# Patient Record
Sex: Female | Born: 1940 | Race: White | Hispanic: No | Smoking: Former smoker
Health system: Southern US, Community
[De-identification: ages and names within clinical notes are randomized; demographics above are authoritative.]

## PROBLEM LIST (undated history)

## (undated) DIAGNOSIS — R0902 Hypoxemia: Secondary | ICD-10-CM

## (undated) DIAGNOSIS — K219 Gastro-esophageal reflux disease without esophagitis: Secondary | ICD-10-CM

## (undated) DIAGNOSIS — J45909 Unspecified asthma, uncomplicated: Secondary | ICD-10-CM

## (undated) DIAGNOSIS — I4891 Unspecified atrial fibrillation: Secondary | ICD-10-CM

## (undated) DIAGNOSIS — T7840XA Allergy, unspecified, initial encounter: Secondary | ICD-10-CM

## (undated) DIAGNOSIS — J9691 Respiratory failure, unspecified with hypoxia: Secondary | ICD-10-CM

## (undated) DIAGNOSIS — H269 Unspecified cataract: Secondary | ICD-10-CM

## (undated) DIAGNOSIS — F419 Anxiety disorder, unspecified: Secondary | ICD-10-CM

## (undated) DIAGNOSIS — E7800 Pure hypercholesterolemia, unspecified: Secondary | ICD-10-CM

## (undated) DIAGNOSIS — M810 Age-related osteoporosis without current pathological fracture: Secondary | ICD-10-CM

## (undated) DIAGNOSIS — I10 Essential (primary) hypertension: Secondary | ICD-10-CM

## (undated) DIAGNOSIS — J449 Chronic obstructive pulmonary disease, unspecified: Secondary | ICD-10-CM

## (undated) DIAGNOSIS — F32A Depression, unspecified: Secondary | ICD-10-CM

## (undated) HISTORY — DX: Depression, unspecified: F32.A

## (undated) HISTORY — DX: Allergy, unspecified, initial encounter: T78.40XA

## (undated) HISTORY — DX: Unspecified atrial fibrillation: I48.91

## (undated) HISTORY — DX: Gastro-esophageal reflux disease without esophagitis: K21.9

## (undated) HISTORY — DX: Age-related osteoporosis without current pathological fracture: M81.0

## (undated) HISTORY — DX: Hypoxemia: R09.02

## (undated) HISTORY — DX: Chronic obstructive pulmonary disease, unspecified: J44.9

## (undated) HISTORY — DX: Respiratory failure, unspecified with hypoxia: J96.91

## (undated) HISTORY — DX: Unspecified cataract: H26.9

## (undated) HISTORY — DX: Anxiety disorder, unspecified: F41.9

## (undated) HISTORY — DX: Unspecified asthma, uncomplicated: J45.909

## (undated) HISTORY — DX: Pure hypercholesterolemia, unspecified: E78.00

---

## 2004-06-13 DIAGNOSIS — I050 Rheumatic mitral stenosis: Secondary | ICD-10-CM | POA: Insufficient documentation

## 2005-08-31 DIAGNOSIS — R0609 Other forms of dyspnea: Secondary | ICD-10-CM | POA: Diagnosis present

## 2012-11-11 DIAGNOSIS — J449 Chronic obstructive pulmonary disease, unspecified: Secondary | ICD-10-CM | POA: Insufficient documentation

## 2014-03-17 DIAGNOSIS — I441 Atrioventricular block, second degree: Secondary | ICD-10-CM | POA: Insufficient documentation

## 2019-12-02 DIAGNOSIS — E559 Vitamin D deficiency, unspecified: Secondary | ICD-10-CM | POA: Insufficient documentation

## 2019-12-02 DIAGNOSIS — Z9981 Dependence on supplemental oxygen: Secondary | ICD-10-CM | POA: Insufficient documentation

## 2019-12-02 DIAGNOSIS — J4540 Moderate persistent asthma, uncomplicated: Secondary | ICD-10-CM

## 2019-12-02 DIAGNOSIS — K582 Mixed irritable bowel syndrome: Secondary | ICD-10-CM | POA: Insufficient documentation

## 2019-12-02 DIAGNOSIS — R87612 Low grade squamous intraepithelial lesion on cytologic smear of cervix (LGSIL): Secondary | ICD-10-CM

## 2019-12-02 DIAGNOSIS — F3341 Major depressive disorder, recurrent, in partial remission: Secondary | ICD-10-CM | POA: Insufficient documentation

## 2019-12-02 DIAGNOSIS — Z8781 Personal history of (healed) traumatic fracture: Secondary | ICD-10-CM | POA: Insufficient documentation

## 2019-12-02 HISTORY — DX: Moderate persistent asthma, uncomplicated: J45.40

## 2019-12-02 HISTORY — DX: Low grade squamous intraepithelial lesion on cytologic smear of cervix (LGSIL): R87.612

## 2020-01-15 DIAGNOSIS — M810 Age-related osteoporosis without current pathological fracture: Secondary | ICD-10-CM | POA: Insufficient documentation

## 2020-01-22 ENCOUNTER — Institutional Professional Consult (permissible substitution): Payer: Medicare HMO | Admitting: Pulmonary Disease

## 2020-01-22 ENCOUNTER — Other Ambulatory Visit: Payer: Self-pay

## 2020-01-22 NOTE — Progress Notes (Deleted)
   Synopsis: Referred in December 2021 for dyspnea on exertion by [name], MD  Subjective:   PATIENT ID: Lindsay [name]: female DOB: October 02, 1940, MRN: [id number]  No chief complaint on file.   HPI  ***  No past medical history on file.   No family history on file.   *** The histories are not reviewed yet. Please review them in the "History" navigator section and refresh this SmartLink.  Social History   Socioeconomic History  . Marital status: Not on file    Spouse name: Not on file  . Number of children: Not on file  . Years of education: Not on file  . Highest education level: Not on file  Occupational History  . Not on file  Tobacco Use  . Smoking status: Not on file  . Smokeless tobacco: Not on file  Substance and Sexual Activity  . Alcohol use: Not on file  . Drug use: Not on file  . Sexual activity: Not on file  Other Topics Concern  . Not on file  Social History Narrative  . Not on file   Social Determinants of Health   Financial Resource Strain: Not on file  Food Insecurity: Not on file  Transportation Needs: Not on file  Physical Activity: Not on file  Stress: Not on file  Social Connections: Not on file  Intimate Partner Violence: Not on file     Not on File   No outpatient medications prior to visit.   No facility-administered medications prior to visit.    ROS   Objective:  Physical Exam   There were no vitals filed for this visit.   on *** LPM *** RA BMI Readings from Last 3 Encounters:  No data found for BMI   Wt Readings from Last 3 Encounters:  No data found for Wt     CBC No results found for: WBC, RBC, HGB, HCT, PLT, MCV, MCH, MCHC, RDW, LYMPHSABS, MONOABS, EOSABS, BASOSABS  ***  Chest Imaging: ***  Pulmonary Functions Testing Results: No flowsheet data found.  FeNO: ***  Pathology: ***  Echocardiogram: ***  Heart Catheterization: ***    Assessment & Plan:     ICD-10-CM   1. DOE (dyspnea on  exertion)  R06.00   2. SOB (shortness of breath)  R06.02     Discussion: ***  No current outpatient medications on file.  I spent *** minutes dedicated to the care of this patient on the date of this encounter to include pre-visit review of records, face-to-face time with the patient discussing conditions above, post visit ordering of testing, clinical documentation with the electronic health record, making appropriate referrals as documented, and communicating necessary findings to members of the patients care team.   Josephine Igo, DO [facility] 01/22/2020 8:14 AM

## 2020-02-17 ENCOUNTER — Ambulatory Visit: Payer: Medicare HMO | Admitting: Pulmonary Disease

## 2020-02-17 ENCOUNTER — Telehealth: Payer: Self-pay | Admitting: Pulmonary Disease

## 2020-02-17 ENCOUNTER — Other Ambulatory Visit: Payer: Self-pay

## 2020-02-17 ENCOUNTER — Encounter: Payer: Self-pay | Admitting: Pulmonary Disease

## 2020-02-17 VITALS — BP 114/74 | HR 100 | Temp 97.6°F | Ht 67.0 in | Wt 230.6 lb

## 2020-02-17 DIAGNOSIS — J9611 Chronic respiratory failure with hypoxia: Secondary | ICD-10-CM | POA: Diagnosis not present

## 2020-02-17 DIAGNOSIS — J4540 Moderate persistent asthma, uncomplicated: Secondary | ICD-10-CM

## 2020-02-17 MED ORDER — ALBUTEROL SULFATE HFA 108 (90 BASE) MCG/ACT IN AERS: 1.0000 | INHALATION_SPRAY | Freq: Four times a day (QID) | RESPIRATORY_TRACT | 6 refills | Status: DC | PRN

## 2020-02-17 NOTE — Patient Instructions (Addendum)
Continue trelegy ellipta inhaler 1 puff daily  Use albuterol inhaler or duoneb nebulizer treatment as needed every 4-6 hours  We will order you a portable oxygen concentrator to be use with physical therapy  Have physical therapy work on posture exercises to help improve your breathing overall

## 2020-02-17 NOTE — Progress Notes (Signed)
Synopsis: Referred in January 2022 for chronic respiratory failure  Subjective:   PATIENT ID: Lindsay [name] GENDER: female DOB: March 12, 1940, MRN: [id number]   HPI  Chief Complaint  Patient presents with  . Consult    Referred by PCP Dr. Bernerd Limbo for chronic SOB. States she is on 2L of O2 24/7. Has noticed the increased SOB for the past 2 years, recently moved to [place].    Lindsay [name] is a 81 year old woman, never smoker with chronic respiratory failure on 2L of supplemental oxygen who is referred to pulmonary clinic for progressive dyspnea.   She reports she started having issues with her breathing 2 years ago after she suffered compound fractures in her back that led to an increase in the curvature of her thoracic spine. She was started on supplemental oxygen shortly after these events occurred. Per her chart she has exertional oxygen desaturations to 87% from her physical therapy notes. She also reports cough and wheezing intermittently. She denies sputum production. She is a never smoker but lived with a smoker for 20 years. She denies history of pneumonias.   She has been started on trelegy ellipta and she felt this helped her for a period of time but doesn't notice lasting relief from it anymore.   She has atrial fibrillation. She does have leg swelling and is taking torsemide.   She has received 2 covid vaccines. She received her pneumonia vaccine 2 weeks ago and she had the influenza vaccine this past fall.     She lived in [place] before but moved to [place] as she had frequent falls and lived on her own. Her daughter lives here in town.   Past Medical History:  Diagnosis Date  . Asthma   . Atrial fibrillation ([place])   . High cholesterol   . Respiratory failure with hypoxia ([place])      History reviewed. No pertinent family history.   Social History   Socioeconomic History  . Marital status: Divorced    Spouse name: Not on file  . Number of children: Not on file   . Years of education: Not on file  . Highest education level: Not on file  Occupational History  . Not on file  Tobacco Use  . Smoking status: Never Smoker  . Smokeless tobacco: Never Used  Substance and Sexual Activity  . Alcohol use: Not on file  . Drug use: Not on file  . Sexual activity: Not on file  Other Topics Concern  . Not on file  Social History Narrative  . Not on file   Social Determinants of Health   Financial Resource Strain: Not on file  Food Insecurity: Not on file  Transportation Needs: Not on file  Physical Activity: Not on file  Stress: Not on file  Social Connections: Not on file  Intimate Partner Violence: Not on file     Allergies  Allergen Reactions  . Acetaminophen-Codeine     Other reaction(s): Agitation     Outpatient Medications Prior to Visit  Medication Sig Dispense Refill  . alendronate (FOSAMAX) 70 MG tablet Take 70 mg by mouth once a week.    Marland Kitchen apixaban (ELIQUIS) 5 MG TABS tablet Take 5 mg by mouth 2 (two) times daily.    Marland Kitchen atorvastatin (LIPITOR) 20 MG tablet Take 20 mg by mouth daily.    . Cholecalciferol 25 MCG (1000 UT) tablet Take by mouth.    . Fluticasone-Umeclidin-Vilant (TRELEGY ELLIPTA) 100-62.5-25 MCG/INH AEPB Inhale 1 puff  into the lungs daily.    Marland Kitchen ipratropium-albuterol (DUONEB) 0.5-2.5 (3) MG/3ML SOLN Take 1 ampule by nebulization 4 (four) times daily.    Marland Kitchen LORazepam (ATIVAN) 1 MG tablet Take 1 mg by mouth 2 (two) times daily.    . metolazone (ZAROXOLYN) 5 MG tablet Take 1 tablet on Monday, Wednesday and Friday.    . metoprolol succinate (TOPROL-XL) 50 MG 24 hr tablet Take by mouth.    . mirtazapine (REMERON) 45 MG tablet Take by mouth.    . sertraline (ZOLOFT) 100 MG tablet Take by mouth.    . torsemide (DEMADEX) 20 MG tablet Take 20 mg by mouth 2 (two) times daily.    Marland Kitchen albuterol (VENTOLIN HFA) 108 (90 Base) MCG/ACT inhaler Inhale into the lungs.     No facility-administered medications prior to visit.    Review of  Systems  Constitutional: Negative for chills, fever, malaise/fatigue and weight loss.  HENT: Negative for congestion, sinus pain and sore throat.   Eyes: Negative.   Respiratory: Positive for shortness of breath and wheezing. Negative for cough, hemoptysis and sputum production.   Cardiovascular: Negative for chest pain, palpitations, orthopnea, claudication and leg swelling.  Gastrointestinal: Negative for abdominal pain, heartburn, nausea and vomiting.  Genitourinary: Negative.   Musculoskeletal: Positive for back pain and falls. Negative for joint pain and myalgias.  Skin: Negative for rash.  Neurological: Negative for weakness.  Endo/Heme/Allergies: Negative.   Psychiatric/Behavioral: Negative.    Objective:   Vitals:   02/17/20 0952  BP: 114/74  Pulse: 100  Temp: 97.6 F (36.4 C)  TempSrc: Temporal  SpO2: 90%  Weight: 230 lb 9.6 oz (104.6 kg)  Height: 5\' 7"  (1.702 m)     Physical Exam Constitutional:      General: She is not in acute distress.    Appearance: She is ill-appearing (chronically).  HENT:     Head: Normocephalic and atraumatic.  Eyes:     General: No scleral icterus.    Conjunctiva/sclera: Conjunctivae normal.     Pupils: Pupils are equal, round, and reactive to light.  Cardiovascular:     Rate and Rhythm: Normal rate and regular rhythm.     Pulses: Normal pulses.     Heart sounds: Normal heart sounds. No murmur heard.   Pulmonary:     Effort: Pulmonary effort is normal.     Breath sounds: Normal breath sounds. No wheezing, rhonchi or rales.  Abdominal:     General: Bowel sounds are normal.     Palpations: Abdomen is soft.  Musculoskeletal:     Right lower leg: No edema.     Left lower leg: No edema.     Comments: Scoliosis noted  Lymphadenopathy:     Cervical: No cervical adenopathy.  Skin:    General: Skin is warm and dry.  Neurological:     General: No focal deficit present.     Mental Status: She is alert.  Psychiatric:        Mood  and Affect: Mood normal.        Behavior: Behavior normal.        Thought Content: Thought content normal.        Judgment: Judgment normal.    CBC No results found for: WBC, RBC, HGB, HCT, PLT, MCV, MCH, MCHC, RDW, LYMPHSABS, MONOABS, EOSABS, BASOSABS   Chest imaging: CXR 12/16/19 The lungs are under aerated with bronchovascular crowding and interstitial prominence. Increased AP diameter on the lateral view. No focal airspace opacities. Minimal blunting  of both costophrenic angles. Mild enlargement of the cardiac silhouette. Prominent central vessels. The thoracic aorta is markedly tortuous. Moderate scattered bony degenerative changes. Increased kyphosis with decreased height of numerous thoracic vertebral bodies. Levoscoliosis of the upper thoracic spine. Diffuse decreased bone density.  PFT: No flowsheet data found.  Labs: Reviewed from [facility] at [place] 1.11 on 12/02/19 Normal CBC  Assessment & Plan:   Chronic respiratory failure with hypoxia ([place]) - Plan: Ambulatory Referral for DME  Moderate persistent asthma without complication  Discussion: Lindsay [name] is a 80 year old woman, never smoker with chronic respiratory failure on 2L of supplemental oxygen who is referred to pulmonary clinic for progressive dyspnea.  She has moderate persistent asthma based on her clinical symptoms. She is to continue on trelegy ellipta daily with as needed albuterol.   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 The timeline of events is consistent with her changes in breathing as well after her compound vertebral body fractures.   Recommend that physical therapy work on posture exercises in order to help her breath better and open up her thoracic cage as much as possible.   Follow up in 3 months.   Freda Jackson, MD [facility] Office: [phone]   See Amion for Pager Details      Current Outpatient Medications:  .  alendronate (FOSAMAX) 70 MG tablet, Take 70 mg by mouth once a week., Disp: , Rfl:  .  apixaban (ELIQUIS) 5 MG TABS tablet, Take 5 mg by mouth 2 (two) times daily., Disp: , Rfl:  .  atorvastatin (LIPITOR) 20 MG tablet, Take 20 mg by mouth daily., Disp: , Rfl:  .  Cholecalciferol 25 MCG (1000 UT) tablet, Take by mouth., Disp: , Rfl:  .  Fluticasone-Umeclidin-Vilant (TRELEGY ELLIPTA) 100-62.5-25 MCG/INH AEPB, Inhale 1 puff into the lungs daily., Disp: , Rfl:  .  ipratropium-albuterol (DUONEB) 0.5-2.5 (3) MG/3ML SOLN, Take 1 ampule by nebulization 4 (four) times daily., Disp: , Rfl:  .  LORazepam (ATIVAN) 1 MG tablet, Take 1 mg by mouth 2 (two) times daily., Disp: , Rfl:  .  metolazone (ZAROXOLYN) 5 MG tablet, Take 1 tablet on Monday, Wednesday and Friday., Disp: , Rfl:  .  metoprolol succinate (TOPROL-XL) 50 MG 24 hr tablet, Take by mouth., Disp: , Rfl:  .  mirtazapine (REMERON) 45 MG tablet, Take by mouth., Disp: , Rfl:  .  sertraline (ZOLOFT) 100 MG tablet, Take by mouth., Disp: , Rfl:  .  torsemide (DEMADEX) 20 MG tablet, Take 20 mg by mouth 2 (two) times daily., Disp: , Rfl:  .  albuterol (VENTOLIN HFA) 108 (90 Base) MCG/ACT inhaler, Inhale 1-2 puffs into the lungs every 6 (six) hours as needed for wheezing or shortness of breath., Disp: 8 g, Rfl: 6

## 2020-02-17 NOTE — Telephone Encounter (Signed)
Patient was seen today by Dr. Erin Fulling for a consult. Patient was a previous patient of [facility] (Dr. Jerl Santos) in [place]. Patient has signed a release of information for a copy of her previous OV notes, echo and PFT results. Will fax release today.

## 2020-03-04 ENCOUNTER — Encounter: Payer: Self-pay | Admitting: Pulmonary Disease

## 2020-08-02 ENCOUNTER — Inpatient Hospital Stay (HOSPITAL_BASED_OUTPATIENT_CLINIC_OR_DEPARTMENT_OTHER)
Admission: EM | Admit: 2020-08-02 | Discharge: 2020-08-06 | DRG: 291 | Disposition: A | Discharge status: Home Health | Payer: Medicare (Managed Care) | Attending: Internal Medicine | Admitting: Internal Medicine

## 2020-08-02 ENCOUNTER — Other Ambulatory Visit: Payer: Self-pay

## 2020-08-02 ENCOUNTER — Inpatient Hospital Stay (HOSPITAL_COMMUNITY): Payer: Medicare (Managed Care)

## 2020-08-02 ENCOUNTER — Encounter (HOSPITAL_BASED_OUTPATIENT_CLINIC_OR_DEPARTMENT_OTHER): Payer: Self-pay | Admitting: Emergency Medicine

## 2020-08-02 ENCOUNTER — Emergency Department (HOSPITAL_BASED_OUTPATIENT_CLINIC_OR_DEPARTMENT_OTHER): Payer: Medicare (Managed Care)

## 2020-08-02 DIAGNOSIS — Z886 Allergy status to analgesic agent status: Secondary | ICD-10-CM | POA: Diagnosis not present

## 2020-08-02 DIAGNOSIS — R0602 Shortness of breath: Secondary | ICD-10-CM | POA: Diagnosis not present

## 2020-08-02 DIAGNOSIS — M4000 Postural kyphosis, site unspecified: Secondary | ICD-10-CM | POA: Diagnosis present

## 2020-08-02 DIAGNOSIS — K220 Achalasia of cardia: Secondary | ICD-10-CM | POA: Diagnosis present

## 2020-08-02 DIAGNOSIS — J45901 Unspecified asthma with (acute) exacerbation: Secondary | ICD-10-CM

## 2020-08-02 DIAGNOSIS — E669 Obesity, unspecified: Secondary | ICD-10-CM | POA: Diagnosis present

## 2020-08-02 DIAGNOSIS — Z885 Allergy status to narcotic agent status: Secondary | ICD-10-CM | POA: Diagnosis not present

## 2020-08-02 DIAGNOSIS — Z9981 Dependence on supplemental oxygen: Secondary | ICD-10-CM

## 2020-08-02 DIAGNOSIS — I2510 Atherosclerotic heart disease of native coronary artery without angina pectoris: Secondary | ICD-10-CM | POA: Diagnosis present

## 2020-08-02 DIAGNOSIS — R54 Age-related physical debility: Secondary | ICD-10-CM | POA: Diagnosis present

## 2020-08-02 DIAGNOSIS — I5043 Acute on chronic combined systolic (congestive) and diastolic (congestive) heart failure: Secondary | ICD-10-CM | POA: Diagnosis present

## 2020-08-02 DIAGNOSIS — Z7901 Long term (current) use of anticoagulants: Secondary | ICD-10-CM | POA: Diagnosis not present

## 2020-08-02 DIAGNOSIS — E871 Hypo-osmolality and hyponatremia: Secondary | ICD-10-CM

## 2020-08-02 DIAGNOSIS — I480 Paroxysmal atrial fibrillation: Secondary | ICD-10-CM

## 2020-08-02 DIAGNOSIS — Z79899 Other long term (current) drug therapy: Secondary | ICD-10-CM

## 2020-08-02 DIAGNOSIS — R3911 Hesitancy of micturition: Secondary | ICD-10-CM | POA: Diagnosis present

## 2020-08-02 DIAGNOSIS — I509 Heart failure, unspecified: Secondary | ICD-10-CM | POA: Diagnosis not present

## 2020-08-02 DIAGNOSIS — M810 Age-related osteoporosis without current pathological fracture: Secondary | ICD-10-CM | POA: Diagnosis present

## 2020-08-02 DIAGNOSIS — E876 Hypokalemia: Secondary | ICD-10-CM | POA: Diagnosis not present

## 2020-08-02 DIAGNOSIS — R1312 Dysphagia, oropharyngeal phase: Secondary | ICD-10-CM | POA: Diagnosis present

## 2020-08-02 DIAGNOSIS — Z8249 Family history of ischemic heart disease and other diseases of the circulatory system: Secondary | ICD-10-CM | POA: Diagnosis not present

## 2020-08-02 DIAGNOSIS — Z803 Family history of malignant neoplasm of breast: Secondary | ICD-10-CM

## 2020-08-02 DIAGNOSIS — R1310 Dysphagia, unspecified: Secondary | ICD-10-CM

## 2020-08-02 DIAGNOSIS — I5031 Acute diastolic (congestive) heart failure: Secondary | ICD-10-CM | POA: Diagnosis present

## 2020-08-02 DIAGNOSIS — I5033 Acute on chronic diastolic (congestive) heart failure: Secondary | ICD-10-CM | POA: Diagnosis present

## 2020-08-02 DIAGNOSIS — Z6837 Body mass index (BMI) 37.0-37.9, adult: Secondary | ICD-10-CM | POA: Diagnosis not present

## 2020-08-02 DIAGNOSIS — R350 Frequency of micturition: Secondary | ICD-10-CM | POA: Diagnosis present

## 2020-08-02 DIAGNOSIS — I4819 Other persistent atrial fibrillation: Secondary | ICD-10-CM

## 2020-08-02 DIAGNOSIS — J9621 Acute and chronic respiratory failure with hypoxia: Secondary | ICD-10-CM

## 2020-08-02 DIAGNOSIS — I5023 Acute on chronic systolic (congestive) heart failure: Secondary | ICD-10-CM

## 2020-08-02 DIAGNOSIS — T502X5A Adverse effect of carbonic-anhydrase inhibitors, benzothiadiazides and other diuretics, initial encounter: Secondary | ICD-10-CM | POA: Diagnosis present

## 2020-08-02 DIAGNOSIS — F419 Anxiety disorder, unspecified: Secondary | ICD-10-CM | POA: Diagnosis present

## 2020-08-02 DIAGNOSIS — Z7983 Long term (current) use of bisphosphonates: Secondary | ICD-10-CM | POA: Diagnosis not present

## 2020-08-02 DIAGNOSIS — Z7722 Contact with and (suspected) exposure to environmental tobacco smoke (acute) (chronic): Secondary | ICD-10-CM | POA: Diagnosis present

## 2020-08-02 DIAGNOSIS — Z20822 Contact with and (suspected) exposure to covid-19: Secondary | ICD-10-CM | POA: Diagnosis present

## 2020-08-02 DIAGNOSIS — I110 Hypertensive heart disease with heart failure: Secondary | ICD-10-CM | POA: Diagnosis not present

## 2020-08-02 DIAGNOSIS — D6859 Other primary thrombophilia: Secondary | ICD-10-CM | POA: Diagnosis present

## 2020-08-02 DIAGNOSIS — J4520 Mild intermittent asthma, uncomplicated: Secondary | ICD-10-CM

## 2020-08-02 DIAGNOSIS — K2970 Gastritis, unspecified, without bleeding: Secondary | ICD-10-CM | POA: Diagnosis present

## 2020-08-02 DIAGNOSIS — E7800 Pure hypercholesterolemia, unspecified: Secondary | ICD-10-CM | POA: Diagnosis present

## 2020-08-02 DIAGNOSIS — D6869 Other thrombophilia: Secondary | ICD-10-CM | POA: Diagnosis not present

## 2020-08-02 DIAGNOSIS — J45909 Unspecified asthma, uncomplicated: Secondary | ICD-10-CM | POA: Diagnosis present

## 2020-08-02 DIAGNOSIS — I700 Atherosclerosis of aorta: Secondary | ICD-10-CM | POA: Diagnosis not present

## 2020-08-02 DIAGNOSIS — J9611 Chronic respiratory failure with hypoxia: Secondary | ICD-10-CM | POA: Diagnosis present

## 2020-08-02 HISTORY — DX: Essential (primary) hypertension: I10

## 2020-08-02 HISTORY — DX: Hypo-osmolality and hyponatremia: E87.1

## 2020-08-02 HISTORY — DX: Dysphagia, unspecified: R13.10

## 2020-08-02 HISTORY — DX: Unspecified asthma with (acute) exacerbation: J45.901

## 2020-08-02 HISTORY — DX: Acute and chronic respiratory failure with hypoxia: J96.21

## 2020-08-02 LAB — CBC
HCT: 37.9 % (ref 36.0–46.0)
Hemoglobin: 13 g/dL (ref 12.0–15.0)
MCH: 31.3 pg (ref 26.0–34.0)
MCHC: 34.3 g/dL (ref 30.0–36.0)
MCV: 91.1 fL (ref 80.0–100.0)
Platelets: 309 10*3/uL (ref 150–400)
RBC: 4.16 MIL/uL (ref 3.87–5.11)
RDW: 13.5 % (ref 11.5–15.5)
WBC: 8.8 10*3/uL (ref 4.0–10.5)
nRBC: 0 % (ref 0.0–0.2)

## 2020-08-02 LAB — BLOOD GAS, VENOUS
Acid-Base Excess: 10.9 mmol/L — ABNORMAL HIGH (ref 0.0–2.0)
Bicarbonate: 41.5 mmol/L — ABNORMAL HIGH (ref 20.0–28.0)
O2 Saturation: 34.9 %
Patient temperature: 97.9
pCO2, Ven: 71.4 mmHg (ref 44.0–60.0)
pH, Ven: 7.38 (ref 7.250–7.430)
pO2, Ven: 23 mmHg — CL (ref 32.0–45.0)

## 2020-08-02 LAB — COMPREHENSIVE METABOLIC PANEL
ALT: 13 U/L (ref 0–44)
AST: 15 U/L (ref 15–41)
Albumin: 3.7 g/dL (ref 3.5–5.0)
Alkaline Phosphatase: 71 U/L (ref 38–126)
Anion gap: 10 (ref 5–15)
BUN: 14 mg/dL (ref 8–23)
CO2: 35 mmol/L — ABNORMAL HIGH (ref 22–32)
Calcium: 9.2 mg/dL (ref 8.9–10.3)
Chloride: 86 mmol/L — ABNORMAL LOW (ref 98–111)
Creatinine, Ser: 0.78 mg/dL (ref 0.44–1.00)
GFR, Estimated: >60 mL/min (ref >60)
Glucose, Bld: 122 mg/dL — ABNORMAL HIGH (ref 70–99)
Potassium: 3.2 mmol/L — ABNORMAL LOW (ref 3.5–5.1)
Sodium: 131 mmol/L — ABNORMAL LOW (ref 135–145)
Total Bilirubin: 0.6 mg/dL (ref 0.3–1.2)
Total Protein: 6.8 g/dL (ref 6.5–8.1)

## 2020-08-02 LAB — BASIC METABOLIC PANEL
Anion gap: 14 (ref 5–15)
BUN: 14 mg/dL (ref 8–23)
CO2: 37 mmol/L — ABNORMAL HIGH (ref 22–32)
Calcium: 9.6 mg/dL (ref 8.9–10.3)
Chloride: 83 mmol/L — ABNORMAL LOW (ref 98–111)
Creatinine, Ser: 0.77 mg/dL (ref 0.44–1.00)
GFR, Estimated: >60 mL/min (ref >60)
Glucose, Bld: 92 mg/dL (ref 70–99)
Potassium: 3.2 mmol/L — ABNORMAL LOW (ref 3.5–5.1)
Sodium: 134 mmol/L — ABNORMAL LOW (ref 135–145)

## 2020-08-02 LAB — RESP PANEL BY RT-PCR (FLU A&B, COVID) ARPGX2
Influenza A by PCR: NEGATIVE
Influenza B by PCR: NEGATIVE
SARS Coronavirus 2 by RT PCR: NEGATIVE

## 2020-08-02 LAB — BRAIN NATRIURETIC PEPTIDE: B Natriuretic Peptide: 305.4 pg/mL — ABNORMAL HIGH (ref 0.0–100.0)

## 2020-08-02 LAB — PHOSPHORUS: Phosphorus: 3.8 mg/dL (ref 2.5–4.6)

## 2020-08-02 LAB — MAGNESIUM: Magnesium: 1.7 mg/dL (ref 1.7–2.4)

## 2020-08-02 LAB — TROPONIN I (HIGH SENSITIVITY)
Troponin I (High Sensitivity): 7 ng/L (ref <18)
Troponin I (High Sensitivity): 11 ng/L (ref <18)

## 2020-08-02 LAB — CK: Total CK: 55 U/L (ref 38–234)

## 2020-08-02 MED ORDER — METOPROLOL SUCCINATE ER 50 MG PO TB24
50.0000 mg | ORAL_TABLET | Freq: Two times a day (BID) | ORAL | Status: DC
  Administered 2020-08-03 – 2020-08-06 (×7): 50 mg via ORAL
  Filled 2020-08-02 (×8): qty 1

## 2020-08-02 MED ORDER — IPRATROPIUM-ALBUTEROL 0.5-2.5 (3) MG/3ML IN SOLN
3.0000 mL | Freq: Four times a day (QID) | RESPIRATORY_TRACT | Status: DC
  Administered 2020-08-02: 3 mL via RESPIRATORY_TRACT
  Filled 2020-08-02: qty 3

## 2020-08-02 MED ORDER — IPRATROPIUM-ALBUTEROL 0.5-2.5 (3) MG/3ML IN SOLN
3.0000 mL | Freq: Three times a day (TID) | RESPIRATORY_TRACT | Status: DC
  Administered 2020-08-03 – 2020-08-04 (×3): 3 mL via RESPIRATORY_TRACT
  Filled 2020-08-02 (×4): qty 3

## 2020-08-02 MED ORDER — FUROSEMIDE 10 MG/ML IJ SOLN
40.0000 mg | Freq: Once | INTRAMUSCULAR | Status: AC
  Administered 2020-08-02: 40 mg via INTRAVENOUS
  Filled 2020-08-02: qty 4

## 2020-08-02 MED ORDER — APIXABAN 5 MG PO TABS
5.0000 mg | ORAL_TABLET | Freq: Two times a day (BID) | ORAL | Status: DC
  Administered 2020-08-02 – 2020-08-04 (×4): 5 mg via ORAL
  Filled 2020-08-02: qty 1
  Filled 2020-08-02: qty 2
  Filled 2020-08-02 (×2): qty 1

## 2020-08-02 MED ORDER — MIRTAZAPINE 15 MG PO TABS
45.0000 mg | ORAL_TABLET | Freq: Every day | ORAL | Status: DC
  Administered 2020-08-02 – 2020-08-05 (×4): 45 mg via ORAL
  Filled 2020-08-02 (×4): qty 3

## 2020-08-02 MED ORDER — SERTRALINE HCL 100 MG PO TABS
100.0000 mg | ORAL_TABLET | Freq: Every day | ORAL | Status: DC
  Administered 2020-08-02 – 2020-08-06 (×5): 100 mg via ORAL
  Filled 2020-08-02 (×6): qty 1

## 2020-08-02 MED ORDER — ALBUTEROL SULFATE HFA 108 (90 BASE) MCG/ACT IN AERS: 1.0000 | INHALATION_SPRAY | Freq: Four times a day (QID) | RESPIRATORY_TRACT | Status: DC | PRN

## 2020-08-02 MED ORDER — ATORVASTATIN CALCIUM 20 MG PO TABS
20.0000 mg | ORAL_TABLET | Freq: Every day | ORAL | Status: DC
  Administered 2020-08-03: 20 mg via ORAL
  Filled 2020-08-02 (×2): qty 1

## 2020-08-02 MED ORDER — LORAZEPAM 1 MG PO TABS
1.0000 mg | ORAL_TABLET | Freq: Two times a day (BID) | ORAL | Status: DC
  Administered 2020-08-02 – 2020-08-06 (×8): 1 mg via ORAL
  Filled 2020-08-02 (×8): qty 1

## 2020-08-02 MED ORDER — ALBUTEROL SULFATE (2.5 MG/3ML) 0.083% IN NEBU: 2.5000 mg | INHALATION_SOLUTION | Freq: Four times a day (QID) | RESPIRATORY_TRACT | Status: DC | PRN

## 2020-08-02 MED ORDER — IOHEXOL 350 MG/ML SOLN
80.0000 mL | Freq: Once | INTRAVENOUS | Status: AC | PRN
  Administered 2020-08-02: 73 mL via INTRAVENOUS

## 2020-08-02 MED ORDER — SODIUM CHLORIDE 0.9 % IV SOLN
75.0000 mL/h | INTRAVENOUS | Status: DC
  Administered 2020-08-02: 75 mL/h via INTRAVENOUS

## 2020-08-02 MED ORDER — FUROSEMIDE 10 MG/ML IJ SOLN
40.0000 mg | Freq: Two times a day (BID) | INTRAMUSCULAR | Status: DC
  Administered 2020-08-03 – 2020-08-05 (×5): 40 mg via INTRAVENOUS
  Filled 2020-08-02 (×5): qty 4

## 2020-08-02 MED ORDER — POTASSIUM CHLORIDE CRYS ER 20 MEQ PO TBCR
40.0000 meq | EXTENDED_RELEASE_TABLET | Freq: Once | ORAL | Status: AC
  Administered 2020-08-02: 40 meq via ORAL
  Filled 2020-08-02: qty 2

## 2020-08-02 NOTE — Plan of Care (Signed)
Admission assessment completed with pt and daughter (kelly) at bedside. Pt verbalizes understanding and cooperation with plan of care. Pt alert and oriented and no acute distress. MD notified of patient arrival. Pt education about fall precautions and verbalizes understanding. Pt safety maintained  Problem: Education: Goal: Knowledge of General Education information will improve Description: Including pain rating scale, medication(s)/side effects and non-pharmacologic comfort measures Outcome: Progressing   Problem: Health Behavior/Discharge Planning: Goal: Ability to manage health-related needs will improve Outcome: Progressing   Problem: Clinical Measurements: Goal: Ability to maintain clinical measurements within normal limits will improve Outcome: Progressing Goal: Will remain free from infection Outcome: Progressing Goal: Diagnostic test results will improve Outcome: Progressing Goal: Respiratory complications will improve Outcome: Progressing Goal: Cardiovascular complication will be avoided Outcome: Progressing   Problem: Activity: Goal: Risk for activity intolerance will decrease Outcome: Progressing   Problem: Nutrition: Goal: Adequate nutrition will be maintained Outcome: Progressing   Problem: Coping: Goal: Level of anxiety will decrease Outcome: Progressing   Problem: Elimination: Goal: Will not experience complications related to bowel motility Outcome: Progressing Goal: Will not experience complications related to urinary retention Outcome: Progressing   Problem: Pain Managment: Goal: General experience of comfort will improve Outcome: Progressing   Problem: Safety: Goal: Ability to remain free from injury will improve Outcome: Progressing   Problem: Skin Integrity: Goal: Risk for impaired skin integrity will decrease Outcome: Progressing

## 2020-08-02 NOTE — ED Triage Notes (Addendum)
Pt arrives to ED with c/o fatigue, weakness, and shortness of breath. Pt reports chronic SOB and wears 2L [place] at home as needed. Over the past two days the SOB has worsened past baseline and has made pt wear her O2 consisantly. Pt initial O2 saturation on room air is 77% in triage. Pt denies fevers, chills, dysuria, new cough.

## 2020-08-02 NOTE — ED Notes (Signed)
Handoff given to 4th floor at [facility]

## 2020-08-02 NOTE — Progress Notes (Signed)
Ms. Lindsay [name] is an 81 year old woman with medical history significant for atrial fibrillation, CHF, hypercholesterolemia, asthma, chronic hypoxic respiratory failure on home oxygen as needed, who presented to the [facility] emergency room with fatigue, generalized weakness, exertional shortness of breath and chest pain.  She was hypoxic with oxygen saturation of 77% on room air and is improved to 96% on 2 L/min oxygen in the emergency room.  Case was discussed with Dr. Sherwood Gambler, ED physician, who said patient's clinical features are suggestive of CHF exacerbation and requested admission to [facility] long hospital.

## 2020-08-02 NOTE — ED Provider Notes (Signed)
[facility] EMERGENCY DEPT Provider Note   CSN: [id number] Arrival date & time: 08/02/20  1157     History Chief Complaint  Patient presents with   Shortness of Breath    Lindsay [name] is a 80 y.o. female.  HPI 80 year old female presents with shortness of breath.  She has some chronic respiratory issues and has as needed oxygen but over the last couple days has been having to use it constantly.  She has a chronic intermittent cough that is unchanged.  Has felt like she has had a low-grade fever and some chest pain when she walks.  The chest pain feels dull.  She is also short of breath continuously but worse when ambulating.  Today she got weak and felt like her legs were getting give out when she was walking to breakfast.  Her legs are mildly swollen but improved from previous exacerbations of CHF.  She has been compliant with her meds.  Past Medical History:  Diagnosis Date   Asthma    Atrial fibrillation ([place])    High cholesterol    Respiratory failure with hypoxia [facility])     Patient Active Problem List   Diagnosis Date Noted   Acute on chronic systolic CHF (congestive heart failure) ([place]) 08/02/2020    History reviewed. No pertinent surgical history.   OB History   No obstetric history on file.     History reviewed. No pertinent family history.  Social History   Tobacco Use   Smoking status: Never   Smokeless tobacco: Never    Home Medications Prior to Admission medications   Medication Sig Start Date End Date Taking? Authorizing Provider  albuterol (VENTOLIN HFA) 108 (90 Base) MCG/ACT inhaler Inhale 1-2 puffs into the lungs every 6 (six) hours as needed for wheezing or shortness of breath. 02/17/20   Freddi Starr, MD  alendronate (FOSAMAX) 70 MG tablet Take 70 mg by mouth once a week. 02/10/20   [provider]  apixaban (ELIQUIS) 5 MG TABS tablet Take 5 mg by mouth 2 (two) times daily.    [provider]  atorvastatin  (LIPITOR) 20 MG tablet Take 20 mg by mouth daily. 02/12/20   [provider]  Cholecalciferol 25 MCG (1000 UT) tablet Take by mouth.    [provider]  Fluticasone-Umeclidin-Vilant (TRELEGY ELLIPTA) 100-62.5-25 MCG/INH AEPB Inhale 1 puff into the lungs daily.    [provider]  ipratropium-albuterol (DUONEB) 0.5-2.5 (3) MG/3ML SOLN Take 1 ampule by nebulization 4 (four) times daily.    [provider]  LORazepam (ATIVAN) 1 MG tablet Take 1 mg by mouth 2 (two) times daily. 02/12/20   [provider]  metolazone (ZAROXOLYN) 5 MG tablet Take 1 tablet on Monday, Wednesday and Friday. 02/10/20   [provider]  metoprolol succinate (TOPROL-XL) 50 MG 24 hr tablet Take by mouth. 02/10/20   [provider]  mirtazapine (REMERON) 45 MG tablet Take by mouth. 02/10/20   [provider]  sertraline (ZOLOFT) 100 MG tablet Take by mouth. 02/10/20   [provider]  torsemide (DEMADEX) 20 MG tablet Take 20 mg by mouth 2 (two) times daily. 02/12/20   [provider]    Allergies    Acetaminophen-codeine  Review of Systems   Review of Systems  Constitutional:  Negative for fever.  Respiratory:  Positive for shortness of breath. Negative for cough.   Cardiovascular:  Positive for chest pain and leg swelling.  All other systems reviewed and are negative.  Physical Exam Updated Vital Signs BP 103/71   Pulse 79   Temp 99.3 F (37.4 C) (Oral)   Resp (!) 26   Ht 5\' 7"  (1.702 m)   Wt 113.4 kg   SpO2 98%   BMI 39.16 kg/m   Physical Exam Vitals and nursing note reviewed.  Constitutional:      Appearance: She is well-developed. She is obese.  HENT:     Head: Normocephalic and atraumatic.     Right Ear: External ear normal.     Left Ear: External ear normal.     Nose: Nose normal.  Eyes:     General:        Right eye: No discharge.        Left eye: No discharge.  Cardiovascular:     Rate and Rhythm: Normal rate  and regular rhythm.     Heart sounds: Normal heart sounds.  Pulmonary:     Effort: Pulmonary effort is normal.     Breath sounds: Examination of the right-lower field reveals rales. Examination of the left-lower field reveals rales. Rales present.  Abdominal:     Palpations: Abdomen is soft.     Tenderness: There is no abdominal tenderness.  Musculoskeletal:     Right lower leg: Edema present.     Left lower leg: Edema present.     Comments: Mild pedal edema  Skin:    General: Skin is warm and dry.  Neurological:     Mental Status: She is alert.  Psychiatric:        Mood and Affect: Mood is not anxious.    ED Results / Procedures / Treatments   Labs (all labs ordered are listed, but only abnormal results are displayed) Labs Reviewed  COMPREHENSIVE METABOLIC PANEL - Abnormal; Notable for the following components:      Result Value   Sodium 131 (*)    Potassium 3.2 (*)    Chloride 86 (*)    CO2 35 (*)    Glucose, Bld 122 (*)    All other components within normal limits  BRAIN NATRIURETIC PEPTIDE - Abnormal; Notable for the following components:   B Natriuretic Peptide 305.4 (*)    All other components within normal limits  RESP PANEL BY RT-PCR (FLU A&B, COVID) ARPGX2  CBC  TROPONIN I (HIGH SENSITIVITY)  TROPONIN I (HIGH SENSITIVITY)    EKG EKG Interpretation  Date/Time:  Monday August 02 2020 12:07:43 EDT Ventricular Rate:  73 PR Interval:    QRS Duration: 87 QT Interval:  418 QTC Calculation: 461 R Axis:   -76 Text Interpretation: Atrial fibrillation Left anterior fascicular block Low voltage, precordial leads Abnormal R-wave progression, early transition Borderline repolarization abnormality No old tracing to compare Confirmed by Sherwood Gambler [phone]) on 08/02/2020 12:11:23 PM  Radiology DG Chest Port 1 View  Result Date: 08/02/2020 CLINICAL DATA:  Chest pain, shortness of breath EXAM: PORTABLE CHEST 1 VIEW COMPARISON:  None. FINDINGS: Cardiomegaly. Small  bilateral pleural effusions with bibasilar atelectasis or infiltrates. No acute bony abnormality. IMPRESSION: Small bilateral pleural effusions with bibasilar atelectasis or infiltrates. Electronically Signed   By: Rolm Baptise M.D.   On: 08/02/2020 12:27    Procedures Procedures   Medications Ordered in ED Medications  potassium chloride SA (KLOR-CON) CR tablet 40 mEq (40 mEq Oral Given 08/02/20 1348)  furosemide (LASIX) injection 40 mg (40 mg Intravenous Given 08/02/20 1411)    ED Course  I have reviewed the triage vital signs and the nursing  notes.  Pertinent labs & imaging results that were available during my care of the patient were reviewed by me and considered in my medical decision making (see chart for details).    MDM Rules/Calculators/A&P                          Patient's presentation is most consistent with acute CHF exacerbation.  She was hypoxic into the 70s on room air.  Even just going to the bathroom she dropped her sats while on 2 L into the 70s.  The patient is not in distress while on oxygen at rest however.  She has had soft blood pressures in the 90s but states this is her typical blood pressure.  She will be given IV diuresis and oral potassium supplementation.  She will be admitted to the hospitalist service.  No obvious infectious cause.  Of note, her daughter is asking for the patient to get a swallow study while she is in the hospital as she often seems to choke on food. Final Clinical Impression(s) / ED Diagnoses Final diagnoses:  Shortness of breath  Acute on chronic congestive heart failure, unspecified heart failure type (HCC)  Acute on chronic respiratory failure with hypoxia [facility])    Rx / DC Orders ED Discharge Orders     None        Sherwood Gambler, MD 08/02/20 1544

## 2020-08-02 NOTE — H&P (Signed)
Lindsay [name] LZJ:[id number] DOB: Nov 24, 1940 DOA: 08/02/2020    PCP: Bernerd Limbo, MD   Outpatient Specialists:   . Pulmonary  Dr.Dewald    Patient arrived to ER on 08/02/20 at 1157 Referred by Attending Jennye Boroughs, MD   Patient coming from:   From facility [place]  Chief Complaint:   Chief Complaint  Patient presents with   Shortness of Breath    HPI: Lindsay [name] is a 80 y.o. female with medical history significant of chronic respiratory failure on o2 at 2L PRN, asthma, A. fib on Eliquis, HLD osteoporosis    Presented with   worsening SOB and hypoxia fatigue and weakness noted some leg edema but she has some of that She has be en getting progressively more more short of breath back in January was seen by pulmonology who felt that her breathing issues were also worsened by compound fractures to her back that led to increase in curvature of her thoracic spine she has been having intermittent exertional oxygen desaturations although lately they have been below 80s.  She has been having intermittent cough and wheezing.  Today in triage her oxygen saturation down to 77% on room air Family states she has been having trouble with swallowing large pills.  Such as potassium  Patient never smoked herself but lived in a smoker for past 20 years.  pulmonology tried on [place] but it did not seem to work for too long when she stopped. She has not bee using her torsemide as prescribed skipping her night dose due to not wanting to get up to use the bathroom   Pt moved from [place] last August she never had a pulmonologist or cardiologist there She has been on PRN oxygen for years  Family states foods get caught up in the back of her throat She has frequent UTI but no recent complaints    Reports has been taking her medications as prescribed Has  been vaccinated against COVID  and boosted   Initial COVID TEST  NEGATIVE   Lab Results  Component Value Date    [place] 08/02/2020     Regarding pertinent Chronic problems:     Hyperlipidemia -  on statins Lipitor   History of anxiety on Ativan twice a day 1 mg   HTN on Toprol   Fluid overload chronic on  Metolazone 3 times a week torsemide   Osteoporosis - Fosomax   obesity-   BMI Readings from Last 1 Encounters:  08/02/20 39.16 kg/m      Asthma -well  controlled on home inhalers/ nebs       A. Fib -  - CHA2DS2 vas score   > 3      current  on anticoagulation with  Eliquis,          -  Rate control:  Currently controlled with  Toprolol,       While in ER: Noted to have sodium of 131 and potassium of 3.2 BNP elevated at 305  She was given a dose of Lasix IV   ED Triage Vitals  Enc Vitals Group     BP 08/02/20 1208 106/67     Pulse Rate 08/02/20 1208 77     Resp 08/02/20 1208 (!) 23     Temp 08/02/20 1208 99.3 F (37.4 C)     Temp Source 08/02/20 1208 Oral     SpO2 08/02/20 1208 96 %     Weight 08/02/20 1206 250 lb (113.4 kg)  Height 08/02/20 1206 5\' 7"  (1.702 m)     Head Circumference --      Peak Flow --      Pain Score 08/02/20 1206 0     Pain Loc --      Pain Edu? --      Excl. in [place]? --   TMAX(24)@     _________________________________________ Significant initial  Findings: Abnormal Labs Reviewed  COMPREHENSIVE METABOLIC PANEL - Abnormal; Notable for the following components:      Result Value   Sodium 131 (*)    Potassium 3.2 (*)    Chloride 86 (*)    CO2 35 (*)    Glucose, Bld 122 (*)    All other components within normal limits  BRAIN NATRIURETIC PEPTIDE - Abnormal; Notable for the following components:   B Natriuretic Peptide 305.4 (*)    All other components within normal limits   ____________________________________________ Ordered    CXR - small bilateral pleural effusion, cardiomegally     _________________________ Troponin 7 -11 ECG: Ordered Personally reviewed by me showing: HR : 73 Rhythm:  A.fib.  Anterior fascicular  block Ischemic changes*nonspecific changes, no evidence of ischemic changes QTC 461 _______________   The recent clinical data is shown below. Vitals:   08/02/20 1318 08/02/20 1400 08/02/20 1415 08/02/20 1753  BP: 94/72 108/81 103/71 106/73  Pulse: 68 79 79 67  Resp: (!) 24 (!) 23 (!) 26 18  Temp:    97.9 F (36.6 C)  TempSrc:    Oral  SpO2: 94% 98% 98% 96%  Weight:      Height:          WBC     Component Value Date/Time   WBC 8.8 08/02/2020 1213       UA  ordered  Results for orders placed or performed during the hospital encounter of 08/02/20  Resp Panel by RT-PCR (Flu A&B, Covid)     Status: None   Collection Time: 08/02/20  1:03 PM  Result Value Ref Range Status   SARS Coronavirus 2 by RT PCR NEGATIVE NEGATIVE Final         Influenza A by PCR NEGATIVE NEGATIVE Final   Influenza B by PCR NEGATIVE NEGATIVE Final           _______________________________________________ Hospitalist was called for admission for acute on chronic CHF exacerbation  The following Work up has been ordered so far:  Orders Placed This Encounter  Procedures   Resp Panel by RT-PCR (Flu A&B, Covid)   DG Chest Port 1 View   CBC   Comprehensive metabolic panel   Brain natriuretic peptide   DIET DYS 3 Room service appropriate? Yes; Fluid consistency: Thin   Cardiac monitoring   If O2 Sat <94% administer O2 at 2 liters/minute via nasal cannula   Consult to hospitalist   Consult to Transition of Care   Airborne and Contact precautions   Pulse oximetry, continuous   EKG 12-Lead   ED EKG   Saline lock IV   Admit to Inpatient (patient's expected length of stay will be greater than 2 midnights or inpatient only procedure)      Following Medications were ordered in ER: Medications  potassium chloride SA (KLOR-CON) CR tablet 40 mEq (40 mEq Oral Given 08/02/20 1348)  furosemide (LASIX) injection 40 mg (40 mg Intravenous Given 08/02/20 1411)        Consult Orders  (From admission,  onward)  Start     Ordered   08/02/20 1406  Consult to hospitalist  Spoke to Maudie Mercury at [facility]       Provider:  (Not yet assigned)  Question Answer Comment  Place call to: [facility]   Reason for Consult Admit      08/02/20 1405              OTHER Significant initial  Findings:  labs showing:    Recent Labs  Lab 08/02/20 1213  NA 131*  K 3.2*  CO2 35*  GLUCOSE 122*  BUN 14  CREATININE 0.78  CALCIUM 9.2    Cr   stable,    Lab Results  Component Value Date   CREATININE 0.78 08/02/2020    Recent Labs  Lab 08/02/20 1213  AST 15  ALT 13  ALKPHOS 71  BILITOT 0.6  PROT 6.8  ALBUMIN 3.7   Lab Results  Component Value Date   CALCIUM 9.2 08/02/2020       Plt: Lab Results  Component Value Date   PLT 309 08/02/2020     COVID-19 Labs  No results for input(s): DDIMER, FERRITIN, LDH, CRP in the last 72 hours.  Lab Results  Component Value Date   SARSCOV2NAA NEGATIVE 08/02/2020    Venous  Blood Gas  ordered       Recent Labs  Lab 08/02/20 1213  WBC 8.8  HGB 13.0  HCT 37.9  MCV 91.1  PLT 309    HG/HCT   stable,     Component Value Date/Time   HGB 13.0 08/02/2020 1213   HCT 37.9 08/02/2020 1213   MCV 91.1 08/02/2020 1213       BNP (last 3 results) Recent Labs    08/02/20 1213  BNP 305.4*           Cultures: No results found for: SDES, SPECREQUEST, CULT, REPTSTATUS   Radiological Exams on Admission: DG Chest Port 1 View  Result Date: 08/02/2020 CLINICAL DATA:  Chest pain, shortness of breath EXAM: PORTABLE CHEST 1 VIEW COMPARISON:  None. FINDINGS: Cardiomegaly. Small bilateral pleural effusions with bibasilar atelectasis or infiltrates. No acute bony abnormality. IMPRESSION: Small bilateral pleural effusions with bibasilar atelectasis or infiltrates. Electronically Signed   By: Rolm Baptise M.D.   On: 08/02/2020 12:27    _______________________________________________________________________________________________________ Latest  Blood pressure 106/73, pulse 67, temperature 97.9 F (36.6 C), temperature source Oral, resp. rate 18, height 5\' 7"  (1.702 m), weight 113.4 kg, SpO2 96 %.   Review of Systems:    Pertinent positives include:  fatigue, shortness of breath at rest.   dyspnea on exertion Bilateral lower extremity swelling  Constitutional:  No weight loss, night sweats, Fevers, chills,  weight loss  HEENT:  No headaches, Difficulty swallowing,Tooth/dental problems,Sore throat,  No sneezing, itching, ear ache, nasal congestion, post nasal drip,  Cardio-vascular:  No chest pain, Orthopnea, PND, anasarca, dizziness, palpitations.no  GI:  No heartburn, indigestion, abdominal pain, nausea, vomiting, diarrhea, change in bowel habits, loss of appetite, melena, blood in stool, hematemesis Resp:  no , No excess mucus, no productive cough, No non-productive cough, No coughing up of blood.No change in color of mucus.No wheezing. Skin:  no rash or lesions. No jaundice GU:  no dysuria, change in color of urine, no urgency or frequency. No straining to urinate.  No flank pain.  Musculoskeletal:  No joint pain or no joint swelling. No decreased range of motion. No back pain.  Psych:  No change in  mood or affect. No depression or anxiety. No memory loss.  Neuro: no localizing neurological complaints, no tingling, no weakness, no double vision, no gait abnormality, no slurred speech, no confusion  All systems reviewed and apart from [employer] all are negative _______________________________________________________________________________________________ Past Medical History:   Past Medical History:  Diagnosis Date   Asthma    Atrial fibrillation ([place])    High cholesterol    Respiratory failure with hypoxia ([place])       History reviewed. No pertinent surgical history.  Social History:  Ambulatory   walker       reports that she has never smoked. She has never used smokeless tobacco. No history on file for alcohol use and drug use.     Family History:   Family History  Problem Relation Age of Onset   Hypertension Other    ______________________________________________________________________________________________ Allergies: Allergies  Allergen Reactions   Acetaminophen-Codeine     Other reaction(s): Agitation     Prior to Admission medications   Medication Sig Start Date End Date Taking? Authorizing Provider  alendronate (FOSAMAX) 70 MG tablet Take 70 mg by mouth once a week. 02/10/20  Yes [provider]  apixaban (ELIQUIS) 5 MG TABS tablet Take 5 mg by mouth 2 (two) times daily.   Yes [provider]  atorvastatin (LIPITOR) 20 MG tablet Take 20 mg by mouth daily. 02/12/20  Yes [provider]  Cholecalciferol 25 MCG (1000 UT) tablet Take by mouth.   Yes [provider]  LORazepam (ATIVAN) 1 MG tablet Take 1 mg by mouth 2 (two) times daily. 02/12/20  Yes [provider]  metolazone (ZAROXOLYN) 5 MG tablet Take 1 tablet on Monday, Wednesday and Friday. 02/10/20  Yes [provider]  metoprolol succinate (TOPROL-XL) 50 MG 24 hr tablet Take by mouth. 02/10/20  Yes [provider]  mirtazapine (REMERON) 45 MG tablet Take by mouth. 02/10/20  Yes [provider]  sertraline (ZOLOFT) 100 MG tablet Take by mouth. 02/10/20  Yes [provider]  torsemide (DEMADEX) 20 MG tablet Take 20 mg by mouth 2 (two) times daily. 02/12/20  Yes [provider]  albuterol (VENTOLIN HFA) 108 (90 Base) MCG/ACT inhaler Inhale 1-2 puffs into the lungs every 6 (six) hours as needed for wheezing or shortness of breath. 02/17/20   Freddi Starr, MD  Fluticasone-Umeclidin-Vilant (TRELEGY ELLIPTA) 100-62.5-25 MCG/INH AEPB Inhale 1 puff into the lungs daily.    [provider]  ipratropium-albuterol (DUONEB)  0.5-2.5 (3) MG/3ML SOLN Take 1 ampule by nebulization 4 (four) times daily.    [provider]    ___________________________________________________________________________________________________ Physical Exam: Vitals with BMI 08/02/2020 08/02/2020 08/02/2020  Height - - -  Weight - - -  BMI - - -  Systolic [phone]  Diastolic 73 71 81  Pulse 67 79 79     1. General:  in No Acute distress    Chronically ill   -appearing 2. Psychological: Alert and  Oriented 3. Head/ENT:  Dry Mucous Membranes                          Head Non traumatic, neck supple                         Poor Dentition 4. SKIN: normal decreased Skin turgor,  Skin clean Dry and intact no rash 5. Heart: Regular rate and rhythm no Murmur, no Rub or gallop  6. Lungs:   no wheezes occasional crackles   7. Abdomen: Soft,  non-tender, Non distended obese  bowel sounds present 8. Lower extremities: no clubbing, cyanosis, trace edema 9. Neurologically Grossly intact, moving all 4 extremities equally   10. MSK: Normal range of motion    Chart has been reviewed  ______________________________________________________________________________________________  Assessment/Plan 80 y.o. female with medical history significant of chronic respiratory failure on o2 at 2L PRN, asthma, A. fib on Eliquis, HLD osteoporosis    Admitted for acute on chronic CHF exacerbation  Present on Admission:  Acute on chronic systolic CHF (congestive heart failure) ([place]) - admit on telemetry,  cycle cardiac enzymes, Troponin  7-11  obtain serial ECG  to evaluate for ischemia as a cause of heart failure  monitor daily weight:  Filed Weights   08/02/20 1206  Weight: 113.4 kg   Last BNP BNP (last 3 results) Recent Labs    08/02/20 1213  BNP 305.4*      diurese with IV lasix and monitor orthostatics and creatinine to avoid over diuresis.  Order echogram to evaluate EF and valves   cardiology consulted  Pending result of  echo gram will need long term adjustment of CHF meds    Acute on chronic respiratory failure with hypoxia ([place])  this patient has acute respiratory failure with Hypoxia   as documented by the presence of following: O2 saturatio< 90% on RA   Likely due to:  CHF exacerbation, COPD exacerbation vs Lung disease Provide O2 therapy and titrate as needed  Continuous pulse ox  check Pulse ox with ambulation prior to discharge   may need  TC consult for home O2 set up Assess for possible chronic aspiration CTA pending  Dysphagia -order speech pathology evaluation modified barium swallow Per family patient may be having trouble with medications and food sometimes even liquids.   Hyponatremia -obtain urine electrolytes patient appears to be fluid up at this time Continue to follow sodium with diuresis check TSH   Hypokalemia -replace and follow check magnesium level   Asthma -continue home medications will need to continue follow-up with pulmonology as an outpatient Currently no wheezing noted with patient states that happens occasionally.   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  DVT prophylaxis:  Eliquis    Code Status:    Code Status: Not on file FULL CODE as per patient family  I had personally discussed CODE STATUS with patient and family      Family Communication:   Family   at  Bedside  plan of care was discussed  with  Daughter,    Disposition Plan:                             Back to current facility when stable                            Following barriers for discharge:                            Electrolytes corrected  Will need to be able to tolerate PO                            Will likely need home health, home O2, set up                            Will need consultants to evaluate patient prior to discharge                       Would benefit from PT/OT eval prior to DC  Ordered                   Swallow eval - SLP ordered                                       Transition of care consulted                                        Consults called:   e-mailed Cardiology patient will need to establish care  Admission status:  ED Disposition     ED Disposition  [place]: [facility] [[id number]]  Level of Care: Progressive [102]  Admit to Progressive based on following criteria: CARDIOVASCULAR & THORACIC of moderate stability with acute coronary syndrome symptoms/low risk myocardial infarction/hypertensive urgency/arrhythmias/heart failure potentially compromising stability and stable post cardiovascular intervention patients.  May [facility] to [facility] or [facility] if equivalent level of care is available:: Yes  Interfacility transfer: Yes  Covid Evaluation: Confirmed COVID Negative  Diagnosis: Acute on chronic systolic CHF (congestive heart failure) [facility] [[id number]]  Admitting Physician: Rayburn Go  Attending Physician: Rayburn Go  Estimated length of stay: past midnight tomorrow  Certification:: I certify this patient will need inpatient services for at least 2 midnights          inpatient     I Expect 2 midnight stay secondary to severity of patient's current illness need for inpatient interventions justified by the following:  hemodynamic instability despite optimal treatment ( hypoxia,  )   Severe lab/radiological/exam abnormalities including:    Cardiomegaly CHF and extensive comorbidities including:    CHF     COPD/asthma  Obesity   Chronic anticoagulation  That are currently affecting medical management.   I expect  patient to be hospitalized for 2 midnights requiring inpatient medical  care.  Patient is at high risk for adverse outcome (such as loss of life or disability) if not treated.  Indication for inpatient stay as follows:    New or worsening hypoxia  Need for IV diuretics   Level of care         SDU tele indefinitely please discontinue once patient no longer qualifies COVID-19 Labs    Lab Results  Component Value Date   [place] NEGATIVE 08/02/2020     Precautions: admitted as  Covid Negative    PPE: Used by the provider:   N95  eye Goggles,  Gloves     Jerni Selmer 08/02/2020, 7:57 PM    [facility]     after 2 AM please page floor coverage PA If  7AM-7PM, please contact the day team taking care of the patient using [email or web address]   Patient was evaluated in the context of the global COVID-19 pandemic, which necessitated consideration that the patient might be at risk for infection with the SARS-CoV-2 virus that causes COVID-19. Institutional protocols and algorithms that pertain to the evaluation of patients at risk for COVID-19 are in a state of rapid change based on information released by regulatory bodies including the CDC and federal and state organizations. These policies and algorithms were followed during the patient's care.

## 2020-08-03 ENCOUNTER — Inpatient Hospital Stay (HOSPITAL_COMMUNITY): Payer: Medicare (Managed Care)

## 2020-08-03 ENCOUNTER — Encounter (HOSPITAL_COMMUNITY): Payer: Self-pay | Admitting: Internal Medicine

## 2020-08-03 DIAGNOSIS — I5033 Acute on chronic diastolic (congestive) heart failure: Secondary | ICD-10-CM | POA: Diagnosis present

## 2020-08-03 DIAGNOSIS — I5031 Acute diastolic (congestive) heart failure: Secondary | ICD-10-CM

## 2020-08-03 HISTORY — DX: Acute diastolic (congestive) heart failure: I50.31

## 2020-08-03 HISTORY — DX: Acute on chronic diastolic (congestive) heart failure: I50.33

## 2020-08-03 LAB — CBC WITH DIFFERENTIAL/PLATELET
Abs Immature Granulocytes: 0.03 10*3/uL (ref 0.00–0.07)
Basophils Absolute: 0.1 10*3/uL (ref 0.0–0.1)
Basophils Relative: 1 %
Eosinophils Absolute: 0.1 10*3/uL (ref 0.0–0.5)
Eosinophils Relative: 2 %
HCT: 41.5 % (ref 36.0–46.0)
Hemoglobin: 13.7 g/dL (ref 12.0–15.0)
Immature Granulocytes: 0 %
Lymphocytes Relative: 13 %
Lymphs Abs: 1.1 10*3/uL (ref 0.7–4.0)
MCH: 31.1 pg (ref 26.0–34.0)
MCHC: 33 g/dL (ref 30.0–36.0)
MCV: 94.1 fL (ref 80.0–100.0)
Monocytes Absolute: 0.6 10*3/uL (ref 0.1–1.0)
Monocytes Relative: 7 %
Neutro Abs: 6.4 10*3/uL (ref 1.7–7.7)
Neutrophils Relative %: 77 %
Platelets: 287 10*3/uL (ref 150–400)
RBC: 4.41 MIL/uL (ref 3.87–5.11)
RDW: 13.6 % (ref 11.5–15.5)
WBC: 8.3 10*3/uL (ref 4.0–10.5)
nRBC: 0 % (ref 0.0–0.2)

## 2020-08-03 LAB — URINALYSIS, ROUTINE W REFLEX MICROSCOPIC
Bilirubin Urine: NEGATIVE
Glucose, UA: NEGATIVE mg/dL
Hgb urine dipstick: NEGATIVE
Ketones, ur: NEGATIVE mg/dL
Leukocytes,Ua: NEGATIVE
Nitrite: NEGATIVE
Protein, ur: NEGATIVE mg/dL
Specific Gravity, Urine: 1.01 (ref 1.005–1.030)
pH: 8 (ref 5.0–8.0)

## 2020-08-03 LAB — COMPREHENSIVE METABOLIC PANEL
ALT: 15 U/L (ref 0–44)
AST: 16 U/L (ref 15–41)
Albumin: 4 g/dL (ref 3.5–5.0)
Alkaline Phosphatase: 71 U/L (ref 38–126)
Anion gap: 11 (ref 5–15)
BUN: 12 mg/dL (ref 8–23)
CO2: 34 mmol/L — ABNORMAL HIGH (ref 22–32)
Calcium: 9.4 mg/dL (ref 8.9–10.3)
Chloride: 88 mmol/L — ABNORMAL LOW (ref 98–111)
Creatinine, Ser: 0.65 mg/dL (ref 0.44–1.00)
GFR, Estimated: >60 mL/min (ref >60)
Glucose, Bld: 96 mg/dL (ref 70–99)
Potassium: 3.1 mmol/L — ABNORMAL LOW (ref 3.5–5.1)
Sodium: 133 mmol/L — ABNORMAL LOW (ref 135–145)
Total Bilirubin: 0.6 mg/dL (ref 0.3–1.2)
Total Protein: 7.5 g/dL (ref 6.5–8.1)

## 2020-08-03 LAB — ECHOCARDIOGRAM COMPLETE
Area-P 1/2: 4.44 cm2
Height: 67 in
S' Lateral: 2.8 cm
Single Plane A4C EF: 62.3 %
Weight: 3823.66 oz

## 2020-08-03 LAB — TSH: TSH: 1.648 u[IU]/mL (ref 0.350–4.500)

## 2020-08-03 LAB — PHOSPHORUS: Phosphorus: 3.9 mg/dL (ref 2.5–4.6)

## 2020-08-03 LAB — MAGNESIUM: Magnesium: 1.7 mg/dL (ref 1.7–2.4)

## 2020-08-03 LAB — OSMOLALITY: Osmolality: 280 mOsm/kg (ref 275–295)

## 2020-08-03 MED ORDER — ACETAMINOPHEN 500 MG PO TABS
1000.0000 mg | ORAL_TABLET | Freq: Four times a day (QID) | ORAL | Status: DC | PRN
  Administered 2020-08-03: 1000 mg via ORAL
  Filled 2020-08-03: qty 2

## 2020-08-03 MED ORDER — MAGNESIUM SULFATE 2 GM/50ML IV SOLN
2.0000 g | Freq: Once | INTRAVENOUS | Status: AC
  Administered 2020-08-03: 2 g via INTRAVENOUS
  Filled 2020-08-03: qty 50

## 2020-08-03 MED ORDER — POTASSIUM CHLORIDE CRYS ER 20 MEQ PO TBCR
40.0000 meq | EXTENDED_RELEASE_TABLET | ORAL | Status: AC
  Administered 2020-08-03 (×4): 40 meq via ORAL
  Filled 2020-08-03 (×4): qty 2

## 2020-08-03 MED ORDER — PHENAZOPYRIDINE HCL 100 MG PO TABS
100.0000 mg | ORAL_TABLET | Freq: Three times a day (TID) | ORAL | Status: AC
  Administered 2020-08-03 – 2020-08-06 (×9): 100 mg via ORAL
  Filled 2020-08-03 (×9): qty 1

## 2020-08-03 NOTE — Progress Notes (Signed)
  Echocardiogram 2D Echocardiogram has been performed.  Lindsay [name] 08/03/2020, 8:36 AM

## 2020-08-03 NOTE — Progress Notes (Signed)
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 fib and therapeutic on Eliquis, anxiety, hypertension hyperlipidemia presented to the emergency room for evaluation of shortness of breath that is progressive, hypoxia and difficulty eating food. In the emergency room on attempted ambulation, patient was 79% on 2 L oxygen.  Clinical evidence of congestive heart failure so admitted to the hospital with cardiology consultation.   Assessment & Plan:   Principal Problem:   Acute diastolic CHF (congestive heart failure) (HCC) Active Problems:   Acute on chronic respiratory failure with hypoxia (HCC)   Hyponatremia   Hypokalemia   Dysphagia   Asthma   AF (paroxysmal atrial fibrillation) (HCC)   Chronic anticoagulation   Acquired thrombophilia (HCC)  Acute diastolic congestive heart failure: Echocardiogram with normal ejection fraction. Probable diastolic dysfunction with long history of hypertension and A. fib. Clinical evidence of fluid overload, treated with IV diuresis with good response.  Continue IV Lasix with aggressive potassium replacement.  Intake and output monitoring.  Followed by cardiology.  Acute on chronic hypoxemic respiratory failure: Multifactorial.  Due to acute diastolic dysfunction, restrictive lung disease and combination of factors.  Keep on oxygen to keep saturation more than 90%.  Will need new oxygen assessment on discharge.  Dysphagia: Seen by speech therapy.  No evidence of oropharyngeal dysphagia.  Relatively new onset difficulty eating, will check barium esophagogram.  On PPI.  Paroxysmal A. fib: Rate controlled.  Therapeutic on Eliquis.  On metoprolol.  Hyponatremia: Dilutional hyponatremia due to  congestive heart failure.  No indication for further intervention to increase sodium.  On diuresis.  Urinary frequency and hesitancy: UA with no evidence of infection.  Use Pyridium for symptomatic relief.  Work with PT OT.  Start mobilizing.  Monitor oxygen level and need for supplemental oxygen.  DVT prophylaxis:  apixaban (ELIQUIS) tablet 5 mg   Code Status: Full code Family Communication: Patient's daughter at the bedside who is an Therapist, sports Disposition Plan: Status is: Inpatient  Remains inpatient appropriate because:Inpatient level of care appropriate due to severity of illness  Dispo: The patient is from:  [facility]              Anticipated d/c is to: Home              Patient currently is not medically stable to d/c.   Difficult to place patient No         Consultants:  Cardiology  Procedures:  None  Antimicrobials:  None   Subjective: Patient seen and examined.  In the morning I was called to see the patient because she was complaining of periurethral pain and she was thinking she might have UTI.  Her urine was crystal clear and urinalysis is normal. At rest she denies any complaints.  Relatively new onset of difficulty swallowing as well as choking on meals.  She does have some wheezing that is chronic.  Objective: Vitals:   08/02/20 2200 08/03/20 0200 08/03/20 0500 08/03/20 0607  BP: 97/82 102/65  103/77  Pulse: 76 (!) 58  66  Resp:  19  16  Temp:  97.8 F (36.6 C)  97.7 F (36.5 C)  TempSrc:  Oral  Oral  SpO2: 100% 91%  96%  Weight:   108.4 kg  Height:        Intake/Output Summary (Last 24 hours) at 08/03/2020 1505 Last data filed at 08/03/2020 0830 Gross per 24 hour  Intake 656.89 ml  Output 1000 ml  Net -343.11 ml   Filed Weights   08/02/20 1206 08/03/20 0500  Weight: 113.4 kg 108.4 kg    Examination:  General exam: Appears calm and comfortable at rest.  Currently on 2 L oxygen. Respiratory system: Mostly upper airway conducted sounds.  Poor  bilateral air entry.  Some expiratory wheezes present no crepitations. Cardiovascular system: S1 & S2 heard, irregularly irregular.  No edema.   Gastrointestinal system: Abdomen is nondistended, soft and nontender. No organomegaly or masses felt. Normal bowel sounds heard. Central nervous system: Alert and oriented. No focal neurological deficits. Extremities: Symmetric 5 x 5 power. Skin: No rashes, lesions or ulcers Psychiatry: Judgement and insight appear normal. Mood & affect appropriate.     Data Reviewed: I have personally reviewed following labs and imaging studies  CBC: Recent Labs  Lab 08/02/20 1213 08/03/20 0359  WBC 8.8 8.3  NEUTROABS  --  6.4  HGB 13.0 13.7  HCT 37.9 41.5  MCV 91.1 94.1  PLT 309 308   Basic Metabolic Panel: Recent Labs  Lab 08/02/20 1213 08/02/20 1935 08/03/20 0359  NA 131* 134* 133*  K 3.2* 3.2* 3.1*  CL 86* 83* 88*  CO2 35* 37* 34*  GLUCOSE 122* 92 96  BUN 14 14 12   CREATININE 0.78 0.77 0.65  CALCIUM 9.2 9.6 9.4  MG  --  1.7 1.7  PHOS  --  3.8 3.9   GFR: Estimated Creatinine Clearance: 71.1 mL/min (by C-G formula based on SCr of 0.65 mg/dL). Liver Function Tests: Recent Labs  Lab 08/02/20 1213 08/03/20 0359  AST 15 16  ALT 13 15  ALKPHOS 71 71  BILITOT 0.6 0.6  PROT 6.8 7.5  ALBUMIN 3.7 4.0   No results for input(s): LIPASE, AMYLASE in the last 168 hours. No results for input(s): AMMONIA in the last 168 hours. Coagulation Profile: No results for input(s): INR, PROTIME in the last 168 hours. Cardiac Enzymes: Recent Labs  Lab 08/02/20 1935  CKTOTAL 55   BNP (last 3 results) No results for input(s): PROBNP in the last 8760 hours. HbA1C: No results for input(s): HGBA1C in the last 72 hours. CBG: No results for input(s): GLUCAP in the last 168 hours. Lipid Profile: No results for input(s): CHOL, HDL, LDLCALC, TRIG, CHOLHDL, LDLDIRECT in the last 72 hours. Thyroid Function Tests: Recent Labs    08/03/20 0359  TSH  1.648   Anemia Panel: No results for input(s): VITAMINB12, FOLATE, FERRITIN, TIBC, IRON, RETICCTPCT in the last 72 hours. Sepsis Labs: No results for input(s): PROCALCITON, LATICACIDVEN in the last 168 hours.  Recent Results (from the past 240 hour(s))  Resp Panel by RT-PCR (Flu A&B, Covid)     Status: None   Collection Time: 08/02/20  1:03 PM  Result Value Ref Range Status   SARS Coronavirus 2 by RT PCR NEGATIVE NEGATIVE Final    Comment: (NOTE) SARS-CoV-2 target nucleic acids are NOT DETECTED.  The SARS-CoV-2 RNA is generally detectable in upper respiratory specimens during the acute phase of infection. The lowest concentration of SARS-CoV-2 viral copies this assay can detect is 138 copies/mL. A negative result does not preclude SARS-Cov-2 infection and should not be used as the sole basis for treatment or other patient management decisions. A negative result may occur with  improper specimen collection/handling, submission  of specimen other than nasopharyngeal swab, presence of viral mutation(s) within the areas targeted by this assay, and inadequate number of viral copies(<138 copies/mL). A negative result must be combined with clinical observations, patient history, and epidemiological information. The expected result is Negative.  Fact Sheet for Patients:  EntrepreneurPulse.com.au  Fact Sheet for Healthcare Providers:  IncredibleEmployment.be  This test is no t yet approved or cleared by the [place] FDA and  has been authorized for detection and/or diagnosis of SARS-CoV-2 by FDA under an Emergency Use Authorization (EUA). This EUA will remain  in effect (meaning this test can be used) for the duration of the COVID-19 declaration under Section 564(b)(1) of the Act, 21 U.S.C.section 360bbb-3(b)(1), unless the authorization is terminated  or revoked sooner.       Influenza A by PCR NEGATIVE NEGATIVE Final   Influenza B by PCR  NEGATIVE NEGATIVE Final    Comment: (NOTE) The Xpert Xpress SARS-CoV-2/FLU/RSV plus assay is intended as an aid in the diagnosis of influenza from Nasopharyngeal swab specimens and should not be used as a sole basis for treatment. Nasal washings and aspirates are unacceptable for Xpert Xpress SARS-CoV-2/FLU/RSV testing.  Fact Sheet for Patients: EntrepreneurPulse.com.au  Fact Sheet for Healthcare Providers: IncredibleEmployment.be  This test is not yet approved or cleared by the [place] FDA and has been authorized for detection and/or diagnosis of SARS-CoV-2 by FDA under an Emergency Use Authorization (EUA). This EUA will remain in effect (meaning this test can be used) for the duration of the COVID-19 declaration under Section 564(b)(1) of the Act, 21 U.S.C. section 360bbb-3(b)(1), unless the authorization is terminated or revoked.  Performed at [facility], [address] [place], [place] [address]          Radiology Studies: CT Angio Chest Pulmonary Embolism (PE) W or WO Contrast  Result Date: 08/02/2020 CLINICAL DATA:  Shortness of breath, chronic respiratory failure history of asthma and atrial fibrillation EXAM: CT ANGIOGRAPHY CHEST WITH CONTRAST TECHNIQUE: Multidetector CT imaging of the chest was performed using the standard protocol during bolus administration of intravenous contrast. Multiplanar CT image reconstructions and MIPs were obtained to evaluate the vascular anatomy. CONTRAST:  58mL OMNIPAQUE IOHEXOL 350 MG/ML SOLN COMPARISON:  Radiograph 08/02/2020 FINDINGS: Cardiovascular: Exam quality is limited by extensive respiratory motion artifact throughout both lungs which may limit detection of smaller segmental and subsegmental pulmonary artery emboli. Otherwise, there is satisfactory opacification of the pulmonary arteries without large central or lobar filling defects identified. Central pulmonary  arteries are normal caliber. Mild cardiomegaly with biatrial enlargement. Coronary artery calcifications. No pericardial effusion. Suboptimal opacification of the thoracic aorta for complete luminal assessment. Atherosclerotic plaque within the normal caliber aorta. Tortuosity of the otherwise normally branching great vessels. Tortuosity of the distal thoracic aorta noted as well. There is notable venous reflux into the hepatic veins and IVC. Mediastinum/Nodes: No mediastinal fluid or gas. No concerning dominant thyroid nodule. No acute abnormality of the trachea or esophagus. No worrisome mediastinal, hilar or axillary adenopathy. Lungs/Pleura: Bilateral pleural effusions, right greater than left. Adjacent areas of likely passive atelectatic change. No other focal consolidative process is evident. There is however some notable pulmonary vascular redistribution with interlobular septal and fissural thickening which suggests some degree of superimposed pulmonary edema. No concerning pulmonary nodules or masses though evaluation is significantly limited by motion artifact. Upper Abdomen: Calcifications the spleen may reflect sequela of prior granulomatous disease. No acute abnormalities present in the visualized portions of the upper abdomen. Musculoskeletal: Multilevel  compression deformities of the thoracic spine with exaggerated thoracic kyphosis appearing largely remote in nature. These include: Pincer type deformity with 90% height loss T7 Pincer type deformity with 75% height loss T8 Concave superior endplate deformity with 20% height loss T9 Pincer deformity at T10 with near complete height loss centrally. Pincer deformity T11 with a 75% height loss. L1 superior endplate deformity, degree of height loss incompletely assessed. No other acute or suspicious osseous injuries. Background of degenerative changes in the spine and shoulders. No worrisome chest wall mass or lesion. Posterior body wall edema. Review of  the MIP images confirms the above findings. IMPRESSION: 1. Exam quality limited by extensive motion artifact. 2. No large central or lobar filling defects. 3. Cardiomegaly with biatrial enlargement. Reflux of contrast into the hepatic veins and IVC may reflect a degree of right heart failure or elevated right heart pressures. 4. Features suggesting developing interstitial edema with bilateral effusions, correlate with clinical features of CHF/volume overload. 5. Additional atelectatic changes in the lung bases. Underlying airspace disease or aspiration difficult to fully exclude. 6. Coronary artery atherosclerosis. Aortic Atherosclerosis (ICD10-I70.0). 7. Multilevel, remote appearing compression deformities throughout the thoracic spine and L1 level, as described above. Electronically Signed   By: Lovena Le M.D.   On: 08/02/2020 21:53   DG Chest Port 1 View  Result Date: 08/02/2020 CLINICAL DATA:  Chest pain, shortness of breath EXAM: PORTABLE CHEST 1 VIEW COMPARISON:  None. FINDINGS: Cardiomegaly. Small bilateral pleural effusions with bibasilar atelectasis or infiltrates. No acute bony abnormality. IMPRESSION: Small bilateral pleural effusions with bibasilar atelectasis or infiltrates. Electronically Signed   By: Rolm Baptise M.D.   On: 08/02/2020 12:27   ECHOCARDIOGRAM COMPLETE  Result Date: 08/03/2020    ECHOCARDIOGRAM REPORT   Patient Name:   TRIXY LOYOLA Date of Exam: 08/03/2020 Medical Rec #:  [id number]  Height:       67.0 in Accession #:    [id number] Weight:       239.0 lb Date of Birth:  21-Jul-1940  BSA:          2.181 m Patient Age:    89 years   BP:           103/77 mmHg Patient Gender: F          HR:           66 bpm. Exam Location:  Inpatient Procedure: 2D Echo, Cardiac Doppler and Color Doppler Indications:    CHF  History:        Patient has no prior history of Echocardiogram examinations.                 Cardiomegaly, Arrythmias:Atrial Fibrillation,                 Signs/Symptoms:Shortness  of Breath and Resp. failure; Risk                 Factors:Hypertension, Dyslipidemia and Obesity.  Sonographer:    Dustin Flock Referring Phys: [phone] ANASTASSIA DOUTOVA  Sonographer Comments: Image acquisition challenging due to COPD and Image acquisition challenging due to patient body habitus. IMPRESSIONS  1. Left ventricular ejection fraction, by estimation, is 60 to 65%. The left ventricle has normal function. The left ventricle has no regional wall motion abnormalities. Left ventricular diastolic function could not be evaluated.  2. Right ventricular systolic function is normal. The right ventricular size is mildly enlarged.  3. The mitral valve is normal in structure. Trivial mitral valve regurgitation. No evidence  of mitral stenosis.  4. The aortic valve is tricuspid. Aortic valve regurgitation is not visualized. Mild aortic valve sclerosis is present, with no evidence of aortic valve stenosis.  5. The inferior vena cava is normal in size with greater than 50% respiratory variability, suggesting right atrial pressure of 3 mmHg. FINDINGS  Left Ventricle: Left ventricular ejection fraction, by estimation, is 60 to 65%. The left ventricle has normal function. The left ventricle has no regional wall motion abnormalities. The left ventricular internal cavity size was normal in size. There is  no left ventricular hypertrophy. Left ventricular diastolic function could not be evaluated due to atrial fibrillation. Left ventricular diastolic function could not be evaluated. Normal left ventricular filling pressure. Right Ventricle: The right ventricular size is mildly enlarged. No increase in right ventricular wall thickness. Right ventricular systolic function is normal. Left Atrium: Left atrial size was normal in size. Right Atrium: Right atrial size was normal in size. Pericardium: There is no evidence of pericardial effusion. Mitral Valve: The mitral valve is normal in structure. Trivial mitral valve  regurgitation. No evidence of mitral valve stenosis. Tricuspid Valve: The tricuspid valve is normal in structure. Tricuspid valve regurgitation is mild . No evidence of tricuspid stenosis. Aortic Valve: The aortic valve is tricuspid. Aortic valve regurgitation is not visualized. Mild aortic valve sclerosis is present, with no evidence of aortic valve stenosis. Pulmonic Valve: The pulmonic valve was normal in structure. Pulmonic valve regurgitation is trivial. No evidence of pulmonic stenosis. Aorta: The aortic root is normal in size and structure. Venous: The inferior vena cava was not well visualized. The inferior vena cava is normal in size with greater than 50% respiratory variability, suggesting right atrial pressure of 3 mmHg. IAS/Shunts: No atrial level shunt detected by color flow Doppler.  LEFT VENTRICLE PLAX 2D LVIDd:         5.30 cm     Diastology LVIDs:         2.80 cm     LV e' medial:    8.59 cm/s LV PW:         1.30 cm     LV E/e' medial:  13.2 LV IVS:        1.10 cm     LV e' lateral:   9.68 cm/s LVOT diam:     2.00 cm     LV E/e' lateral: 11.7 LV SV:         52 LV SV Index:   24 LVOT Area:     3.14 cm  LV Volumes (MOD) LV vol d, MOD A4C: 66.3 ml LV vol s, MOD A4C: 25.0 ml LV SV MOD A4C:     66.3 ml RIGHT VENTRICLE RV Basal diam:  3.70 cm RV S prime:     14.10 cm/s TAPSE (M-mode): 1.9 cm LEFT ATRIUM             Index       RIGHT ATRIUM           Index LA diam:        4.60 cm 2.11 cm/m  RA Area:     21.00 cm LA Vol (A2C):   53.7 ml 24.62 ml/m RA Volume:   60.70 ml  27.83 ml/m LA Vol (A4C):   79.4 ml 36.40 ml/m LA Biplane Vol: 71.2 ml 32.64 ml/m  AORTIC VALVE LVOT Vmax:   75.00 cm/s LVOT Vmean:  54.100 cm/s LVOT VTI:    0.164 m  AORTA Ao Root diam: 3.30  cm MITRAL VALVE                TRICUSPID VALVE MV Area (PHT): 4.44 cm     TR Peak grad:   34.3 mmHg MV Decel Time: 171 msec     TR Vmax:        293.00 cm/s MV E velocity: 113.00 cm/s                             SHUNTS                              Systemic VTI:  0.16 m                             Systemic Diam: 2.00 cm Fransico Him MD Electronically signed by Fransico Him MD Signature Date/Time: 08/03/2020/9:38:21 AM    Final         Scheduled Meds:  apixaban  5 mg Oral BID   atorvastatin  20 mg Oral Daily   furosemide  40 mg Intravenous Q12H   ipratropium-albuterol  3 mL Inhalation TID   LORazepam  1 mg Oral BID   metoprolol succinate  50 mg Oral BID   mirtazapine  45 mg Oral QHS   phenazopyridine  100 mg Oral TID WC   potassium chloride SA  40 mEq Oral Q4H   sertraline  100 mg Oral Daily   Continuous Infusions:   LOS: 1 day    Time spent: 32 minutes    Barb Merino, MD [facility] Pager [phone]

## 2020-08-03 NOTE — Progress Notes (Signed)
PT Cancellation Note  Patient Details Name: Tynleigh Birt MRN: [id number] DOB: [birth year]   Cancelled Treatment:    Reason Eval/Treat Not Completed: Pain limiting ability to participate Pt declines mobility at this time due to UTI symptoms. Awaiting MD visit so she can request meds she takes at home for this.  Pt pleasantly requested PT to check back after she gets this medication.    Greene Diodato,KATHrine E 08/03/2020, 9:55 AM Jannette Spanner PT, DPT [facility] Services Pager: [phone] Office: [phone]

## 2020-08-03 NOTE — Evaluation (Signed)
Clinical/Bedside Swallow Evaluation Patient Details  Name: Lindsay [name] MRN: [id number] Date of Birth: 09/08/1940  Today's Date: 08/03/2020 Time: SLP Start Time (ACUTE ONLY): 47 SLP Stop Time (ACUTE ONLY): 1125 SLP Time Calculation (min) (ACUTE ONLY): 35 min  Past Medical History:  Past Medical History:  Diagnosis Date   Asthma    Atrial fibrillation (HCC)    High cholesterol    HTN (hypertension)    Respiratory failure with hypoxia ([place])    Past Surgical History: History reviewed. No pertinent surgical history. HPI:  80 yo female adm to [facility] with CHF exacerbation.  Pt with PMH + for asthma, Afib, respiratory failure and is on oxygen at baseline prn but needed it more often prior to admission.  Pt reported to ED MD that she has dysphagia - coughing with intake - intermittently.  She also endorses taking Tums PRN over the last six months.  I'm not so worried about oropharyngeal dysphagia.  She reports taking Tums prn - over the last six months. She also has had to bring food back up (x2)secretions. .  She has reported getting choked on food *to point of leaving dining room due to worry re: vomiting from excessive coughing.   Pt denies expelling any food with those 2 occurences.    Pt denies weight loss nor pneumonias and daughter advised they were meaning to mention this to the pt's PCP.  Swallow eval ordered.   Assessment / Plan / Recommendation Clinical Impression  Patient presents with clinical indications of functional oropharyngeal swallow *? possible component of UES dysfunction?* and symptoms that may be consistent with esophageal dysphagia.  She easily passed 3 ounce Yale water challenge .  No coughing or evidence of overt difficulty with intake of graham cracker, applesauce, juice and water.  She is observed to demonstrate mild expiratory wheeze after intake, which daughter states has been occuring with this admit.  Pt is on oxygen and thus advised she always start intake with liquids to  moisten oropharyngeal area and faciliate clearance given her intermittent coughing with intake.  Daughter reports pt became wheezy today after taking her potassium.    Recommend consider esophagram given pt acute onset of symptoms x 6 months that she has been self treating with Tums.  Given pt's current CHF, dyspnea on exertion per pt and dysphagia - recommend pills with puree  - start and follow with liquids.  Using teach back, educated daughter and pt to recommendations - assuring rest breaks if pt dyspneic.    Of note, pt's subtle left facial asymmetry is baseline *for years * per her daughter.    Provided menu for daughter to order "heart healthy" foods for pt.  Will follow up briefly for education if indicated post-esophagram. Spoke to xray, staff reports plan for esophagram tomorrow am - relayed information to pt, daughter and RN. Thanks for this order. SLP Visit Diagnosis: Dysphagia, unspecified (R13.10);Dysphagia, pharyngoesophageal phase (R13.14)    Aspiration Risk  Mild aspiration risk    Diet Recommendation Regular;Thin liquid   Liquid Administration via: Cup;Straw Medication Administration: Whole meds with puree Supervision: Patient able to self feed Compensations: Slow rate;Small sips/bites (start intake with liquids)    Other  Recommendations Oral Care Recommendations: Oral care BID   Follow up Recommendations        Frequency and Duration min 1 x/week  1 week       Prognosis Prognosis for Safe Diet Advancement: Good Barriers to Reach Goals: Severity of deficits;Time post onset  Swallow Study   General Date of Onset: 08/03/20 HPI: 80 yo female adm to [facility] with CHF exacerbation.  Pt with PMH + for asthma, Afib, respiratory failure and is on oxygen at baseline prn but needed it more often prior to admission.  Pt reported to ED MD that she has dysphagia - coughing with intake - intermittently.  She also endorses taking Tums PRN over the last six months.  I'm not so  worried about oropharyngeal dysphagia.  She reports taking Tums prn - over the last six months. She also has had to bring food back up (x2)secretions. .  She has reported getting choked on food *to point of leaving dining room due to worry re: vomiting from excessive coughing.   Pt denies expelling any food with those 2 occurences.    Pt denies weight loss nor pneumonias and daughter advised they were meaning to mention this to the pt's PCP.  Swallow eval ordered. Type of Study: Bedside Swallow Evaluation Previous Swallow Assessment: none Diet Prior to this Study: NPO Temperature Spikes Noted: No Respiratory Status: Nasal cannula History of Recent Intubation: No Behavior/Cognition: Alert;Cooperative;Pleasant mood Oral Cavity Assessment: Within Functional Limits Oral Care Completed by SLP: No Oral Cavity - Dentition: Adequate natural dentition Vision: Functional for self-feeding Self-Feeding Abilities: Able to feed self Patient Positioning: Upright in bed Baseline Vocal Quality: Low vocal intensity Volitional Cough: Strong    Oral/Motor/Sensory Function Overall Oral Motor/Sensory Function: Other (comment) (minimal left facial asymmetry which daughter states is normal)   Ice Chips Ice chips: Not tested Other Comments: due to dentition sensitivity   Thin Liquid Thin Liquid: Within functional limits Presentation: Cup;Self Fed;Straw;Spoon    Nectar Thick Nectar Thick Liquid: Not tested   Honey Thick Honey Thick Liquid: Not tested   Puree Puree: Within functional limits Presentation: Self Fed;Spoon   Solid     Solid: Within functional limits Presentation: Self Fredirick Lathe 08/03/2020,12:13 PM   Kathleen Lime, MS [facility] SLP [place] Office [phone] Pager [phone]

## 2020-08-03 NOTE — Plan of Care (Signed)
  Problem: Education: Goal: Knowledge of General Education information will improve Description: Including pain rating scale, medication(s)/side effects and non-pharmacologic comfort measures Outcome: Progressing   Problem: Health Behavior/Discharge Planning: Goal: Ability to manage health-related needs will improve Outcome: Progressing   Problem: Nutrition: Goal: Adequate nutrition will be maintained Outcome: Progressing   

## 2020-08-03 NOTE — Consult Note (Signed)
Cardiology Consultation:   Patient ID: Lindsay [name] MRN: [id number]; DOB: 06-27-1940  Admit date: 08/02/2020 Date of Consult: 08/03/2020  PCP:  Bernerd Limbo, MD   [facility] Providers Cardiologist:  None        Patient Profile:   Lindsay [name] is a 80 y.o. 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 Roel Cluck.  History of Present Illness:   Lindsay [name] is originally from [place], [place]. She defers to her daughter who is an Therapist, sports to provide her history. Atrial fib was diagnosed many years ago. She has never had cardioversion procedure but has been on Eliquis. Her daughter thinks afib has been intermittent. Daughter states her mother tends to get really anxious about everything so she anticipates this is why primary care has managed all of her conditions rather than referring out to specialists or taking a very aggressive approach. Daughter denied prior diagnosis of CHF/LV dysfunction but patient has been managed with torsemide for chronic edema - in the fall of 2021, metolazone was added with significant improvement since then. She comes to [place] on a home diuretic regimen to include torsemide 20mg  BID and metolazone 5mg  MWF although daughter states patient has been deferring the PM dose lately due to the interruption of urination while trying to sleep. She typically uses home O2 QHS. No outside records available.  For the last month she has been noticing some dysphagia with food/pills getting stick halfway down. They had planned to see PCP but this had not occurred yet. She lives in assisted living and every day they require them to come down to the dining hall for [employer]. The patient called her daughter yesterday due to  increased SOB. Daughter says she often waits until the last minute to relay concerns. For the past week she has noticed worsening DOE to the point where yesterday she could barely stand at the sink or talk without getting significantly SOB. Daughter observed an O2 sat in the 87s with ambulation. The patient had also reported some vague chest discomfort with ambulation over the last week although she is not able to provide any further details about this, states "I just don't know." Her last episode was sometime this past week. No chest pain at rest. Due to the SOB/hypoxia, she was brought to [facility] and given IV Lasix/potassium. She was also started on IV fluids at 75/hr. Labs show BNP 305, CK normal, troponin negative x2, hyponatreima of 131 initially, hypokalemia down to 3.1, Mg 1.7, LFTs/CBC/TSH/Cr wnl. CXR with small bilateral pleural effusions with bibasilar atx or infiltrates. CT angio was limited by motion artifact but showed no large central or lobar filling defects, cardiomegaly with biatrial enlargement, reflux of contrast into hepatic veins or IVC may reflect degree of R heart failure or elevated R heart pressures, findings suggest developing interstitial edema with bilateral effusions, corrlate with clinical features of CHF/volume overload, additional atx changes (underlying airspace vs aspiration difficult to exclude), coronary/aortic atherosclerosis, multilevel T spine thoracic deformities.  At present time she says it's difficult to determine whether she's seen any improvement in SOB yet, although reports significant UOP thus far. Unfortunately she is also experiencing significant dysuria with this so nurse has sent msg to IM team about pain control. Daughter reports recurrent  UTIs have been an issue.  2D echo pending.   Past Medical History:  Diagnosis Date   Asthma    Atrial fibrillation (HCC)    High cholesterol    HTN (hypertension)    Respiratory failure with hypoxia  ([place])     History reviewed. No pertinent surgical history.   Home Medications:  Prior to Admission medications   Medication Sig Start Date End Date Taking? Authorizing Provider  albuterol (VENTOLIN HFA) 108 (90 Base) MCG/ACT inhaler Inhale 1-2 puffs into the lungs every 6 (six) hours as needed for wheezing or shortness of breath. 02/17/20  Yes Dewald, Cheryle Horsfall, MD  alendronate (FOSAMAX) 70 MG tablet Take 70 mg by mouth once a week. Saturdays 02/10/20  Yes [provider]  apixaban (ELIQUIS) 5 MG TABS tablet Take 5 mg by mouth 2 (two) times daily.   Yes [provider]  atorvastatin (LIPITOR) 20 MG tablet Take 20 mg by mouth daily. 02/12/20  Yes [provider]  benzonatate (TESSALON) 200 MG capsule Take 200 mg by mouth daily as needed for cough.   Yes [provider]  Calcium-Magnesium-Vitamin D (CALCIUM 1200+D3 PO) Take 1 tablet by mouth daily.   Yes [provider]  Dextromethorphan-Guaifenesin 60-1200 MG 12hr tablet Take 2 tablets by mouth every 12 (twelve) hours.   Yes [provider]  ipratropium-albuterol (DUONEB) 0.5-2.5 (3) MG/3ML SOLN Inhale 3 mLs into the lungs every 6 (six) hours as needed (wheezing).   Yes [provider]  LORazepam (ATIVAN) 1 MG tablet Take 1 mg by mouth 2 (two) times daily. 02/12/20  Yes [provider]  metolazone (ZAROXOLYN) 5 MG tablet Take 5 mg by mouth every Monday, Wednesday, and Friday. 02/10/20  Yes [provider]  metoprolol succinate (TOPROL-XL) 50 MG 24 hr tablet Take 50 mg by mouth 2 (two) times daily. 02/10/20  Yes [provider]  mirtazapine (REMERON) 45 MG tablet Take 45 mg by mouth at bedtime. 02/10/20  Yes [provider]  Multiple Vitamin (MULTIVITAMIN WITH MINERALS) TABS tablet Take 1 tablet by mouth daily.   Yes [provider]  OXYGEN Inhale 2 L into the lungs continuous.   Yes [provider]  sertraline (ZOLOFT) 100 MG tablet  Take 100 mg by mouth daily. 02/10/20  Yes [provider]  torsemide (DEMADEX) 20 MG tablet Take 20 mg by mouth daily. 02/12/20  Yes [provider]    Inpatient Medications: Scheduled Meds:  apixaban  5 mg Oral BID   atorvastatin  20 mg Oral Daily   furosemide  40 mg Intravenous Q12H   ipratropium-albuterol  3 mL Inhalation TID   LORazepam  1 mg Oral BID   metoprolol succinate  50 mg Oral BID   mirtazapine  45 mg Oral QHS   potassium chloride SA  40 mEq Oral Q4H   sertraline  100 mg Oral Daily   Continuous Infusions:  magnesium sulfate bolus IVPB     PRN Meds: albuterol  Allergies:    Allergies  Allergen Reactions   Acetaminophen-Codeine     Other reaction(s): Agitation    Social History:   Social History   Socioeconomic History   Marital status: Divorced    Spouse name: Not on file   Number of children: Not on file   Years of education: Not on file   Highest education level: Not on file  Occupational History   Not on file  Tobacco Use   Smoking status: Former  Pack years: 0.00    Types: Cigarettes   Smokeless tobacco: Never   Tobacco comments:    Quit remotely 1970s  Substance and Sexual Activity   Alcohol use: Not Currently   Drug use: Not on file   Sexual activity: Not on file  Other Topics Concern   Not on file  Social History Narrative   Not on file   Social Determinants of Health   Financial Resource Strain: Not on file  Food Insecurity: Not on file  Transportation Needs: Not on file  Physical Activity: Not on file  Stress: Not on file  Social Connections: Not on file  Intimate Partner Violence: Not on file    Family History:   Family History  Problem Relation Age of Onset   Breast cancer Sister    Hypertension Other      ROS:  Please see the history of present illness.  All other ROS reviewed and negative.     Physical Exam/Data:   Vitals:   08/02/20 2200 08/03/20 0200 08/03/20 0500 08/03/20 0607  BP: 97/82  102/65  103/77  Pulse: 76 (!) 58  66  Resp:  19  16  Temp:  97.8 F (36.6 C)  97.7 F (36.5 C)  TempSrc:  Oral  Oral  SpO2: 100% 91%  96%  Weight:   108.4 kg   Height:        Intake/Output Summary (Last 24 hours) at 08/03/2020 0935 Last data filed at 08/03/2020 0830 Gross per 24 hour  Intake 656.89 ml  Output 1000 ml  Net -343.11 ml   Last 3 Weights 08/03/2020 08/02/2020 02/17/2020  Weight (lbs) 238 lb 15.7 oz 250 lb 230 lb 9.6 oz  Weight (kg) 108.4 kg 113.399 kg 104.599 kg     Body mass index is 37.43 kg/m.  General: Well developed, well nourished WF in no acute distress. Head: Normocephalic, atraumatic, sclera non-icteric, no xanthomas, nares are without discharge. Neck: Negative for carotid bruits. JVP not elevated. Lungs: Patient declined to sit forward for ausculation. Breath sounds are generally diminished and coarse without obvious rales, wheezing or rhonchi anteriorly Heart: Irregularly irregular, S1 S2 without murmurs, rubs, or gallops.  Abdomen: Soft, non-tender, non-distended with normoactive bowel sounds. No rebound/guarding. Extremities: No clubbing or cyanosis. Trace edema. Distal pedal pulses are 2+ and equal bilaterally. Neuro: Alert and oriented X 3. Moves all extremities spontaneously. Psych:  Responds to questions appropriately with a normal affect.    EKG:  The EKG was personally reviewed and demonstrates:  atrial fib 73bpm low voltage precordial leads, poor R wave progression, nonspecific TW changes  Telemetry:  Telemetry was personally reviewed and demonstrates: atrial fib largely rate controlled. One brief episode of HR in the 40s yesterday but otherwise normal  Relevant CV Studies: N/A  Laboratory Data:  High Sensitivity Troponin:   Recent Labs  Lab 08/02/20 1213 08/02/20 1448  TROPONINIHS 7 11     Chemistry Recent Labs  Lab 08/02/20 1213 08/02/20 1935 08/03/20 0359  NA 131* 134* 133*  K 3.2* 3.2* 3.1*  CL 86* 83* 88*  CO2 35* 37* 34*   GLUCOSE 122* 92 96  BUN 14 14 12   CREATININE 0.78 0.77 0.65  CALCIUM 9.2 9.6 9.4  GFRNONAA >60 >60 >60  ANIONGAP 10 14 11     Recent Labs  Lab 08/02/20 1213 08/03/20 0359  PROT 6.8 7.5  ALBUMIN 3.7 4.0  AST 15 16  ALT 13 15  ALKPHOS 71 71  BILITOT 0.6 0.6  Hematology Recent Labs  Lab 08/02/20 1213 08/03/20 0359  WBC 8.8 8.3  RBC 4.16 4.41  HGB 13.0 13.7  HCT 37.9 41.5  MCV 91.1 94.1  MCH 31.3 31.1  MCHC 34.3 33.0  RDW 13.5 13.6  PLT 309 287   BNP Recent Labs  Lab 08/02/20 1213  BNP 305.4*    DDimer No results for input(s): DDIMER in the last 168 hours.   Radiology/Studies:  CT Angio Chest Pulmonary Embolism (PE) W or WO Contrast  Result Date: 08/02/2020 CLINICAL DATA:  Shortness of breath, chronic respiratory failure history of asthma and atrial fibrillation EXAM: CT ANGIOGRAPHY CHEST WITH CONTRAST TECHNIQUE: Multidetector CT imaging of the chest was performed using the standard protocol during bolus administration of intravenous contrast. Multiplanar CT image reconstructions and MIPs were obtained to evaluate the vascular anatomy. CONTRAST:  97mL OMNIPAQUE IOHEXOL 350 MG/ML SOLN COMPARISON:  Radiograph 08/02/2020 FINDINGS: Cardiovascular: Exam quality is limited by extensive respiratory motion artifact throughout both lungs which may limit detection of smaller segmental and subsegmental pulmonary artery emboli. Otherwise, there is satisfactory opacification of the pulmonary arteries without large central or lobar filling defects identified. Central pulmonary arteries are normal caliber. Mild cardiomegaly with biatrial enlargement. Coronary artery calcifications. No pericardial effusion. Suboptimal opacification of the thoracic aorta for complete luminal assessment. Atherosclerotic plaque within the normal caliber aorta. Tortuosity of the otherwise normally branching great vessels. Tortuosity of the distal thoracic aorta noted as well. There is notable venous reflux  into the hepatic veins and IVC. Mediastinum/Nodes: No mediastinal fluid or gas. No concerning dominant thyroid nodule. No acute abnormality of the trachea or esophagus. No worrisome mediastinal, hilar or axillary adenopathy. Lungs/Pleura: Bilateral pleural effusions, right greater than left. Adjacent areas of likely passive atelectatic change. No other focal consolidative process is evident. There is however some notable pulmonary vascular redistribution with interlobular septal and fissural thickening which suggests some degree of superimposed pulmonary edema. No concerning pulmonary nodules or masses though evaluation is significantly limited by motion artifact. Upper Abdomen: Calcifications the spleen may reflect sequela of prior granulomatous disease. No acute abnormalities present in the visualized portions of the upper abdomen. Musculoskeletal: Multilevel compression deformities of the thoracic spine with exaggerated thoracic kyphosis appearing largely remote in nature. These include: Pincer type deformity with 90% height loss T7 Pincer type deformity with 75% height loss T8 Concave superior endplate deformity with 20% height loss T9 Pincer deformity at T10 with near complete height loss centrally. Pincer deformity T11 with a 75% height loss. L1 superior endplate deformity, degree of height loss incompletely assessed. No other acute or suspicious osseous injuries. Background of degenerative changes in the spine and shoulders. No worrisome chest wall mass or lesion. Posterior body wall edema. Review of the MIP images confirms the above findings. IMPRESSION: 1. Exam quality limited by extensive motion artifact. 2. No large central or lobar filling defects. 3. Cardiomegaly with biatrial enlargement. Reflux of contrast into the hepatic veins and IVC may reflect a degree of right heart failure or elevated right heart pressures. 4. Features suggesting developing interstitial edema with bilateral effusions, correlate  with clinical features of CHF/volume overload. 5. Additional atelectatic changes in the lung bases. Underlying airspace disease or aspiration difficult to fully exclude. 6. Coronary artery atherosclerosis. Aortic Atherosclerosis (ICD10-I70.0). 7. Multilevel, remote appearing compression deformities throughout the thoracic spine and L1 level, as described above. Electronically Signed   By: Lovena Le M.D.   On: 08/02/2020 21:53   DG [facility] 1 View  Result  Date: 08/02/2020 CLINICAL DATA:  Chest pain, shortness of breath EXAM: PORTABLE CHEST 1 VIEW COMPARISON:  None. FINDINGS: Cardiomegaly. Small bilateral pleural effusions with bibasilar atelectasis or infiltrates. No acute bony abnormality. IMPRESSION: Small bilateral pleural effusions with bibasilar atelectasis or infiltrates. Electronically Signed   By: Rolm Baptise M.D.   On: 08/02/2020 12:27     Assessment and Plan:   1. Acute on chronic hypoxic respiratory failure with suspected acute on chronic CHF - LVEF not known at this time, await echocardiogram - baseline pulmonary reserve is diminished due to h/o chronic respiratory failure, asthma, and restrictive defect from postural kyphosis/scoliosis - hold metolazone given good UOP with IV Lasix so far, hyponatremia, hypokalemia -> getting aggressive potassium repletion by primary team - stop IV fluids  2. Atrial fibrillation, suspected paroxysmal - per daughter, this has been intermittent for years, but no records available, no prior DCCV - currently rate controlled, continue present regimen of Eliquis and metoprolol and follow on telemetry - will place request to try and get records from PCP in [place], [place] for review - also await echocardiogram for LA size as well  3. Exertional chest discomfort - vague symptoms over the last week with negative troponins this admission - coronary/aortic atherosclerosis noted on CT - patient's daughter denies prior dx of CAD or cath - could also be  related to exertional hypoxia from supply/demand mismatch but await echocardiogram and response to diuresis to determine plans for further workup - check lipid profile in AM and consider statin titration if LDL >70  4. Hypokalemia, hyponatremia, relative hypomagnesemia - suspect related to metolazone use - potassium being repleted by medicine team - Mg 1.7 -> will order Mg sulfate 2g  5. Other medical issues including: - possible UTI/dysuria - dysphagia with solid foods/medications - further eval per medical team  6. Obesity - consider sleep study as OP given AF/CHF and BMI>30  Risk Assessment/Risk Scores:     HEAR Score (for undifferentiated chest pain):  HEAR Score: 6  New York Heart Association (NYHA) Functional Class NYHA Class III-IV  CHA2DS2-VASc Score = 6  This indicates a 9.7% annual risk of stroke. The patient's score is based upon: CHF History: Yes / suspected HTN History: Yes Diabetes History: No Stroke History: No Vascular Disease History: Yes - atherosclerosis on CT Age Score: 2 Gender Score: 1     For questions or updates, please contact [facility] Please consult [email or web address] for contact info under    Signed, Charlie Pitter, [name]  08/03/2020 9:35 AM

## 2020-08-03 NOTE — Evaluation (Signed)
Occupational Therapy Evaluation Patient Details Name: Lindsay [name] MRN: [id number] DOB: [birth year] Today's Date: 08/03/2020    History of Present Illness patient is a 80  year old female who presented to the hospital on 7/11 with  worsening SOB and hypoxia fatigue and weakness. patient was admitted for CHF. FGH:[id number] respiratory failure, asthma, A. fib , HLD osteoporosis   Clinical Impression   Patient is a 80 year old female who was admitted for above.patient was noted to have had a decline in the ability to complete ADLs with increased need for supplemental O2, decreased activity tolerance, increased pain, decreased standing balance impacting participation in ADL tasks. Patient was previously independent in ADL tasks with rollator at [place]. Currently patient required min A for transfers, min A for LB dressing tasks and bathing tasks with min guard for standing balance. Patients O2 was noted to drop to 88 % on 2L/min with functional activity. Patient would continue to benefit from skilled OT services at this time while admitted and after d/c to address noted deficits in order to improve overall safety and independence in ADLs.       Follow Up Recommendations  Home health OT;Supervision - Intermittent    Equipment Recommendations       Recommendations for Other Services       Precautions / Restrictions Precautions Precautions: Fall Restrictions Weight Bearing Restrictions: No      Mobility Bed Mobility Overal bed mobility: Needs Assistance Bed Mobility: Supine to Sit     Supine to sit: Min assist     General bed mobility comments: with HOB raised. patient reported needing to pull up on bed rail but indicatedn o bed rail at home.    Transfers Overall transfer level: Needs assistance   Transfers: Sit to/from Stand;Stand Pivot Transfers Sit to Stand: Min assist;From elevated surface Stand pivot transfers: Min assist       General transfer comment: with rolling  walker.    Balance Overall balance assessment: Needs assistance Sitting-balance support: Single extremity supported Sitting balance-Leahy Scale: Good     Standing balance support: Single extremity supported Standing balance-Leahy Scale: Fair Standing balance comment: with one UE support on rolling walker                           ADL either performed or assessed with clinical judgement   ADL Overall ADL's : Needs assistance/impaired Eating/Feeding: Modified independent;Set up;Sitting   Grooming: Wash/dry hands;Wash/dry face;Oral care;Set up;Sitting   Upper Body Bathing: Minimal assistance;Sitting   Lower Body Bathing: Minimal assistance;Sit to/from stand Lower Body Bathing Details (indicate cue type and reason): patient was noted to have O2 drop to 88% on 2L/min while completing LB bathing tasks in standing. Upper Body Dressing : Minimal assistance;Sitting   Lower Body Dressing: Minimal assistance;Sit to/from stand Lower Body Dressing Details (indicate cue type and reason): with rolling walker. Toilet Transfer: Minimal assistance;RW   Toileting- Clothing Manipulation and Hygiene: Minimal assistance;Sit to/from stand       Functional mobility during ADLs: Min guard;Rolling walker       Vision   Vision Assessment?: No apparent visual deficits     Perception     Praxis      Pertinent Vitals/Pain Pain Assessment: No/denies pain     Hand Dominance Right   Extremity/Trunk Assessment Upper Extremity Assessment Upper Extremity Assessment: Generalized weakness   Lower Extremity Assessment Lower Extremity Assessment: Defer to PT evaluation   Cervical / Trunk Assessment Cervical /  Trunk Assessment: Normal   Communication Communication Communication: No difficulties   Cognition Arousal/Alertness: Awake/alert Behavior During Therapy: WFL for tasks assessed/performed Overall Cognitive Status: Within Functional Limits for tasks assessed                                      General Comments       Exercises     Shoulder Instructions      Home Living Family/patient expects to be discharged to:: Private residence (ILF) Living Arrangements: Alone Available Help at Discharge: Family;Available PRN/intermittently Type of Home: Independent living facility       Home Layout: One level         Bathroom Toilet: Standard Bathroom Accessibility: Yes   Home Equipment: Walker - 4 wheels;Shower seat;Toilet riser          Prior Functioning/Environment Level of Independence: Independent with assistive device(s)        Comments: rollator for ADLs with independence in bathing, toileting and dressing tasks.        OT Problem List: Decreased strength;Impaired balance (sitting and/or standing);Decreased safety awareness;Decreased activity tolerance;Decreased coordination      OT Treatment/Interventions: Self-care/ADL training;DME and/or AE instruction;Therapeutic activities;Balance training;Patient/family education    OT Goals(Current goals can be found in the care plan section) Acute Rehab OT Goals Patient Stated Goal: to get better at breathing and walking OT Goal Formulation: With patient Time For Goal Achievement: 08/17/20 Potential to Achieve Goals: Good  OT Frequency: Min 2X/week   Barriers to D/C:            Co-evaluation              AM-PAC OT "6 Clicks" Daily Activity     Outcome Measure Help from another person eating meals?: None Help from another person taking care of personal grooming?: None Help from another person toileting, which includes using toliet, bedpan, or urinal?: A Little Help from another person bathing (including washing, rinsing, drying)?: A Little Help from another person to put on and taking off regular upper body clothing?: A Little Help from another person to put on and taking off regular lower body clothing?: A Little 6 Click Score: 20   End of Session Equipment  Utilized During Treatment: Gait belt;Rolling walker Nurse Communication: Other (comment) (nurse educated on patients O2 saturation during ADL task.)  Activity Tolerance: Patient tolerated treatment well Patient left: in chair;with call bell/phone within reach;with chair alarm set  OT Visit Diagnosis: Unsteadiness on feet (R26.81);Muscle weakness (generalized) (M62.81)                Time: 2536-6440 OT Time Calculation (min): 35 min Charges:  OT General Charges $OT Visit: 1 Visit OT Evaluation $OT Eval Low Complexity: 1 Low OT Treatments $Self Care/Home Management : 8-22 mins  Jackelyn Poling OTR/L, MS Acute Rehabilitation Department Office# [phone] Pager# [phone]   [place] 08/03/2020, 1:42 PM

## 2020-08-03 NOTE — Evaluation (Signed)
Physical Therapy Evaluation Patient Details Name: Lindsay [name] MRN: [id number] DOB: 18-Mar-1940 Today's Date: 08/03/2020   History of Present Illness  Patient is an 80 year old female who presented to the hospital on 7/11 with  worsening SOB and hypoxia fatigue and weakness. Patient was admitted Acute diastolic congestive heart failure. FTD:DUKGURK respiratory failure, asthma, A. fib , HLD osteoporosis  Clinical Impression  Pt admitted with above diagnosis. Pt currently with functional limitations due to the deficits listed below (see PT Problem List). Pt will benefit from skilled PT to increase their independence and safety with mobility to allow discharge to the venue listed below.  Pt in recliner on arrival and ready to go back to bed.  Pt requested to brush teeth first so assisted pt with ambulating short distance to sink.  Pt reliant on UE support and typically uses rollator at facility.  Pt wears oxygen at night or PRN at baseline and currently on 2L O2 [place].  Pt would benefit from HHPT upon d/c     Follow Up Recommendations Home health PT;Supervision - Intermittent    Equipment Recommendations  None recommended by PT    Recommendations for Other Services       Precautions / Restrictions Precautions Precautions: Fall Precaution Comments: monitor sats Restrictions Weight Bearing Restrictions: No      Mobility  Bed Mobility Overal bed mobility: Needs Assistance Bed Mobility: Sit to Supine     Supine to sit: Min assist Sit to supine: Min assist   General bed mobility comments: light assist for LEs    Transfers Overall transfer level: Needs assistance Equipment used: Rolling walker (2 wheeled) Transfers: Sit to/from Stand Sit to Stand: Min assist Stand pivot transfers: Min assist       General transfer comment: verbal cues for hand placement, assist to rise and control descent  Ambulation/Gait Ambulation/Gait assistance: Min guard;Min assist Gait Distance (Feet): 5  Feet (x2) Assistive device: Rolling walker (2 wheeled) Gait Pattern/deviations: Step-through pattern;Decreased stride length;Trunk flexed     General Gait Details: pt requiring bil UE support so pt held furniture in room and provided HHA over to sink as pt requesting to brush teeth; provided RW for return to bed; pt declined further ambulation due to fatigue; Pt remained on 2L O2 [place] and Spo2 92% upon sitting on bed  Stairs            Wheelchair Mobility    Modified Rankin (Stroke Patients Only)       Balance Overall balance assessment: Needs assistance Sitting-balance support: Single extremity supported Sitting balance-Leahy Scale: Good     Standing balance support: Single extremity supported Standing balance-Leahy Scale: Poor Standing balance comment: reliant on UE support                             Pertinent Vitals/Pain Pain Assessment: No/denies pain    Home Living Family/patient expects to be discharged to::  (ILF) Living Arrangements: Alone Available Help at Discharge: Family;Available PRN/intermittently Type of Home: Independent living facility       Home Layout: One level Home Equipment: [place] - 4 wheels;Shower seat;Toilet riser Additional Comments: only wears O2 at night or as needed    Prior Function Level of Independence: Independent with assistive device(s)         Comments: rollator for ADLs with independence in bathing, toileting and dressing tasks.     Hand Dominance   Dominant Hand: Right    Extremity/Trunk  Assessment   Upper Extremity Assessment Upper Extremity Assessment: Generalized weakness    Lower Extremity Assessment Lower Extremity Assessment: Generalized weakness    Cervical / Trunk Assessment Cervical / Trunk Assessment: Normal  Communication   Communication: No difficulties  Cognition Arousal/Alertness: Awake/alert Behavior During Therapy: WFL for tasks assessed/performed;Anxious Overall Cognitive  Status: Within Functional Limits for tasks assessed                                        General Comments      Exercises     Assessment/Plan    PT Assessment Patient needs continued PT services  PT Problem List Decreased strength;Decreased activity tolerance;Decreased balance;Decreased knowledge of use of DME;Decreased mobility       PT Treatment Interventions Gait training;DME instruction;Therapeutic exercise;Balance training;Functional mobility training;Therapeutic activities;Patient/family education;Neuromuscular re-education    PT Goals (Current goals can be found in the Care Plan section)  Acute Rehab PT Goals Patient Stated Goal: to get better at breathing and walking PT Goal Formulation: With patient Time For Goal Achievement: 08/17/20 Potential to Achieve Goals: Good    Frequency Min 3X/week   Barriers to discharge        Co-evaluation               AM-PAC PT "6 Clicks" Mobility  Outcome Measure Help needed turning from your back to your side while in a flat bed without using bedrails?: A Little Help needed moving from lying on your back to sitting on the side of a flat bed without using bedrails?: A Little Help needed moving to and from a bed to a chair (including a wheelchair)?: A Little Help needed standing up from a chair using your arms (e.g., wheelchair or bedside chair)?: A Little Help needed to walk in hospital room?: A Little Help needed climbing 3-5 steps with a railing? : A Lot 6 Click Score: 17    End of Session Equipment Utilized During Treatment: Oxygen Activity Tolerance: Patient tolerated treatment well Patient left: in bed;with call bell/phone within reach;with bed alarm set;with family/visitor present   PT Visit Diagnosis: Difficulty in walking, not elsewhere classified (R26.2)    Time: 1941-7408 PT Time Calculation (min) (ACUTE ONLY): 12 min   Charges:   PT Evaluation $PT Eval Low Complexity: 1 Low         Kati PT, DPT Acute Rehabilitation Services Pager: [phone] Office: [phone]   [place] E 08/03/2020, 3:36 PM

## 2020-08-04 ENCOUNTER — Inpatient Hospital Stay (HOSPITAL_COMMUNITY): Payer: Medicare (Managed Care)

## 2020-08-04 LAB — CBC
HCT: 43.8 % (ref 36.0–46.0)
Hemoglobin: 14.4 g/dL (ref 12.0–15.0)
MCH: 31.1 pg (ref 26.0–34.0)
MCHC: 32.9 g/dL (ref 30.0–36.0)
MCV: 94.6 fL (ref 80.0–100.0)
Platelets: 296 10*3/uL (ref 150–400)
RBC: 4.63 MIL/uL (ref 3.87–5.11)
RDW: 14.1 % (ref 11.5–15.5)
WBC: 7.5 10*3/uL (ref 4.0–10.5)
nRBC: 0 % (ref 0.0–0.2)

## 2020-08-04 LAB — BASIC METABOLIC PANEL
Anion gap: 11 (ref 5–15)
BUN: 15 mg/dL (ref 8–23)
CO2: 37 mmol/L — ABNORMAL HIGH (ref 22–32)
Calcium: 9.3 mg/dL (ref 8.9–10.3)
Chloride: 87 mmol/L — ABNORMAL LOW (ref 98–111)
Creatinine, Ser: 0.96 mg/dL (ref 0.44–1.00)
GFR, Estimated: 60 mL/min — ABNORMAL LOW (ref >60)
Glucose, Bld: 105 mg/dL — ABNORMAL HIGH (ref 70–99)
Potassium: 3.6 mmol/L (ref 3.5–5.1)
Sodium: 135 mmol/L (ref 135–145)

## 2020-08-04 LAB — LIPID PANEL
Cholesterol: 179 mg/dL (ref 0–200)
HDL: 74 mg/dL (ref >40)
LDL Cholesterol: 92 mg/dL (ref 0–99)
Total CHOL/HDL Ratio: 2.4 RATIO
Triglycerides: 64 mg/dL (ref <150)
VLDL: 13 mg/dL (ref 0–40)

## 2020-08-04 LAB — MAGNESIUM: Magnesium: 2.2 mg/dL (ref 1.7–2.4)

## 2020-08-04 MED ORDER — SODIUM CHLORIDE 0.9 % IV SOLN: INTRAVENOUS | Status: DC

## 2020-08-04 MED ORDER — IPRATROPIUM-ALBUTEROL 0.5-2.5 (3) MG/3ML IN SOLN
3.0000 mL | Freq: Two times a day (BID) | RESPIRATORY_TRACT | Status: DC
  Administered 2020-08-04 – 2020-08-05 (×3): 3 mL via RESPIRATORY_TRACT
  Filled 2020-08-04 (×3): qty 3

## 2020-08-04 MED ORDER — ATORVASTATIN CALCIUM 40 MG PO TABS
40.0000 mg | ORAL_TABLET | Freq: Every day | ORAL | Status: DC
  Administered 2020-08-04 – 2020-08-06 (×3): 40 mg via ORAL
  Filled 2020-08-04 (×3): qty 1

## 2020-08-04 MED ORDER — POTASSIUM CHLORIDE CRYS ER 20 MEQ PO TBCR
20.0000 meq | EXTENDED_RELEASE_TABLET | Freq: Two times a day (BID) | ORAL | Status: DC
  Administered 2020-08-04 (×2): 20 meq via ORAL
  Filled 2020-08-04 (×2): qty 1

## 2020-08-04 MED ORDER — PANTOPRAZOLE SODIUM 40 MG IV SOLR
40.0000 mg | Freq: Two times a day (BID) | INTRAVENOUS | Status: DC
  Administered 2020-08-04 – 2020-08-06 (×5): 40 mg via INTRAVENOUS
  Filled 2020-08-04 (×5): qty 40

## 2020-08-04 NOTE — Plan of Care (Signed)

## 2020-08-04 NOTE — Consult Note (Signed)
Referring Provider: Dr. Barb Merino Primary Care Physician:  Bernerd Limbo, MD Primary Gastroenterologist: Althia Forts  Reason for Consultation:  Dysphagia, abnormal esophagram  HPI: Lindsay [name] is a 80 y.o. female with past medical history of diastolic CHF, chronic hypoxemic respiratory failure, and A fib (on Eliquis) presenting for consultation of dysphagia and abnormal barium swallow.  Patient reports intermittent dysphagia for the past month.  She has dysphagia to both solids and liquids at times.  She denies any other gastrointestinal symptoms.  Denies nausea, vomiting, changes in appetite, unexplained weight loss, abdominal pain, changes in bowel habits, melena, or hematochezia.  Barium swallow today revealed marked esophageal dysmotility with imaging findings suggestive of early achalasia.  Takes Eliquis daily, last dose this morning.  She occasionally uses NSAIDs as needed.  Mother deceased of bowel perforation.  No known family history of colon cancer or gastrointestinal malignancy.  Patient reports a colonoscopy between 5 and 10 years ago, which she believes was normal.  No records available electronically.  Past Medical History:  Diagnosis Date  . Asthma   . Atrial fibrillation ([place])   . High cholesterol   . HTN (hypertension)   . Respiratory failure with hypoxia ([place])     History reviewed. No pertinent surgical history.  Prior to Admission medications   Medication Sig Start Date End Date Taking? Authorizing Provider  albuterol (VENTOLIN HFA) 108 (90 Base) MCG/ACT inhaler Inhale 1-2 puffs into the lungs every 6 (six) hours as needed for wheezing or shortness of breath. 02/17/20  Yes Dewald, Cheryle Horsfall, MD  alendronate (FOSAMAX) 70 MG tablet Take 70 mg by mouth once a week. Saturdays 02/10/20  Yes [provider]  apixaban (ELIQUIS) 5 MG TABS tablet Take 5 mg by mouth 2 (two) times daily.   Yes [provider]  atorvastatin (LIPITOR) 20 MG tablet Take 20  mg by mouth daily. 02/12/20  Yes [provider]  benzonatate (TESSALON) 200 MG capsule Take 200 mg by mouth daily as needed for cough.   Yes [provider]  Calcium-Magnesium-Vitamin D (CALCIUM 1200+D3 PO) Take 1 tablet by mouth daily.   Yes [provider]  Dextromethorphan-Guaifenesin 60-1200 MG 12hr tablet Take 2 tablets by mouth every 12 (twelve) hours.   Yes [provider]  ipratropium-albuterol (DUONEB) 0.5-2.5 (3) MG/3ML SOLN Inhale 3 mLs into the lungs every 6 (six) hours as needed (wheezing).   Yes [provider]  LORazepam (ATIVAN) 1 MG tablet Take 1 mg by mouth 2 (two) times daily. 02/12/20  Yes [provider]  metolazone (ZAROXOLYN) 5 MG tablet Take 5 mg by mouth every Monday, Wednesday, and Friday. 02/10/20  Yes [provider]  metoprolol succinate (TOPROL-XL) 50 MG 24 hr tablet Take 50 mg by mouth 2 (two) times daily. 02/10/20  Yes [provider]  mirtazapine (REMERON) 45 MG tablet Take 45 mg by mouth at bedtime. 02/10/20  Yes [provider]  Multiple Vitamin (MULTIVITAMIN WITH MINERALS) TABS tablet Take 1 tablet by mouth daily.   Yes [provider]  OXYGEN Inhale 2 L into the lungs continuous.   Yes [provider]  sertraline (ZOLOFT) 100 MG tablet Take 100 mg by mouth daily. 02/10/20  Yes [provider]  torsemide (DEMADEX) 20 MG tablet Take 20 mg by mouth daily. 02/12/20  Yes [provider]    Scheduled Meds: . atorvastatin  40 mg Oral Daily  . furosemide  40 mg Intravenous Q12H  . ipratropium-albuterol  3 mL Inhalation BID  .  LORazepam  1 mg Oral BID  . metoprolol succinate  50 mg Oral BID  . mirtazapine  45 mg Oral QHS  . phenazopyridine  100 mg Oral TID WC  . potassium chloride  20 mEq Oral BID  . sertraline  100 mg Oral Daily   Continuous Infusions: PRN Meds:.acetaminophen, albuterol  Allergies as of 08/02/2020 - Review Complete 08/02/2020   Allergen Reaction Noted  . Acetaminophen-codeine  12/02/2019    Family History  Problem Relation Age of Onset  . Breast cancer Sister   . Hypertension Other     Social History   Socioeconomic History  . Marital status: Divorced    Spouse name: Not on file  . Number of children: Not on file  . Years of education: Not on file  . Highest education level: Not on file  Occupational History  . Not on file  Tobacco Use  . Smoking status: Former    Pack years: 0.00    Types: Cigarettes  . Smokeless tobacco: Never  . Tobacco comments:    Quit remotely 1970s  Substance and Sexual Activity  . Alcohol use: Not Currently  . Drug use: Not on file  . Sexual activity: Not on file  Other Topics Concern  . Not on file  Social History Narrative  . Not on file   Social Determinants of Health   Financial Resource Strain: Not on file  Food Insecurity: Not on file  Transportation Needs: Not on file  Physical Activity: Not on file  Stress: Not on file  Social Connections: Not on file  Intimate Partner Violence: Not on file    Review of Systems: Review of Systems  Constitutional:  Negative for chills and fever.  HENT:  Negative for hearing loss and tinnitus.   Eyes:  Negative for pain and redness.  Respiratory:  Positive for shortness of breath. Negative for cough.   Cardiovascular:  Negative for chest pain and palpitations.  Gastrointestinal:  Negative for abdominal pain, blood in stool, constipation, diarrhea, heartburn, melena, nausea and vomiting.  Genitourinary:  Negative for flank pain and hematuria.  Musculoskeletal:  Negative for falls and joint pain.  Skin:  Negative for itching and rash.  Neurological:  Negative for seizures and loss of consciousness.  Endo/Heme/Allergies:  Negative for polydipsia. Does not bruise/bleed easily.  Psychiatric/Behavioral:  Negative for substance abuse. The patient is not nervous/anxious.    Physical Exam: Vital signs: Vitals:    08/04/20 0820 08/04/20 1327  BP:  116/72  Pulse:  (!) 56  Resp:  16  Temp:  99.2 F (37.3 C)  SpO2: 95% (!) 86%   Last BM Date: 08/01/20  Physical Exam Vitals reviewed.  Constitutional:      General: She is not in acute distress.    Interventions: Nasal cannula in place.  HENT:     Head: Normocephalic and atraumatic.     Nose: Nose normal. No congestion.     Mouth/Throat:     Mouth: Mucous membranes are moist.     Pharynx: Oropharynx is clear.  Eyes:     General: No scleral icterus.    Extraocular Movements: Extraocular movements intact.     Conjunctiva/sclera: Conjunctivae normal.  Cardiovascular:     Rate and Rhythm: Normal rate and regular rhythm.     Pulses: Normal pulses.  Pulmonary:     Effort: Pulmonary effort is normal. No respiratory distress.  Abdominal:     General: Bowel sounds are normal. There is no distension.  Palpations: Abdomen is soft.  Musculoskeletal:        General: No swelling or tenderness.     Cervical back: Normal range of motion and neck supple.  Skin:    General: Skin is warm and dry.  Neurological:     General: No focal deficit present.     Mental Status: She is alert and oriented to person, place, and time.  Psychiatric:        Mood and Affect: Mood normal.        Behavior: Behavior normal.    GI:  Lab Results: Recent Labs    08/02/20 1213 08/03/20 0359 08/04/20 0359  WBC 8.8 8.3 7.5  HGB 13.0 13.7 14.4  HCT 37.9 41.5 43.8  PLT 309 287 296   BMET Recent Labs    08/02/20 1935 08/03/20 0359 08/04/20 0359  NA 134* 133* 135  K 3.2* 3.1* 3.6  CL 83* 88* 87*  CO2 37* 34* 37*  GLUCOSE 92 96 105*  BUN 14 12 15   CREATININE 0.77 0.65 0.96  CALCIUM 9.6 9.4 9.3   LFT Recent Labs    08/03/20 0359  PROT 7.5  ALBUMIN 4.0  AST 16  ALT 15  ALKPHOS 71  BILITOT 0.6   PT/INR No results for input(s): LABPROT, INR in the last 72 hours.   Studies/Results: CT Angio Chest Pulmonary Embolism (PE) W or WO  Contrast  Result Date: 08/02/2020 CLINICAL DATA:  Shortness of breath, chronic respiratory failure history of asthma and atrial fibrillation EXAM: CT ANGIOGRAPHY CHEST WITH CONTRAST TECHNIQUE: Multidetector CT imaging of the chest was performed using the standard protocol during bolus administration of intravenous contrast. Multiplanar CT image reconstructions and MIPs were obtained to evaluate the vascular anatomy. CONTRAST:  94mL OMNIPAQUE IOHEXOL 350 MG/ML SOLN COMPARISON:  Radiograph 08/02/2020 FINDINGS: Cardiovascular: Exam quality is limited by extensive respiratory motion artifact throughout both lungs which may limit detection of smaller segmental and subsegmental pulmonary artery emboli. Otherwise, there is satisfactory opacification of the pulmonary arteries without large central or lobar filling defects identified. Central pulmonary arteries are normal caliber. Mild cardiomegaly with biatrial enlargement. Coronary artery calcifications. No pericardial effusion. Suboptimal opacification of the thoracic aorta for complete luminal assessment. Atherosclerotic plaque within the normal caliber aorta. Tortuosity of the otherwise normally branching great vessels. Tortuosity of the distal thoracic aorta noted as well. There is notable venous reflux into the hepatic veins and IVC. Mediastinum/Nodes: No mediastinal fluid or gas. No concerning dominant thyroid nodule. No acute abnormality of the trachea or esophagus. No worrisome mediastinal, hilar or axillary adenopathy. Lungs/Pleura: Bilateral pleural effusions, right greater than left. Adjacent areas of likely passive atelectatic change. No other focal consolidative process is evident. There is however some notable pulmonary vascular redistribution with interlobular septal and fissural thickening which suggests some degree of superimposed pulmonary edema. No concerning pulmonary nodules or masses though evaluation is significantly limited by motion artifact.  Upper Abdomen: Calcifications the spleen may reflect sequela of prior granulomatous disease. No acute abnormalities present in the visualized portions of the upper abdomen. Musculoskeletal: Multilevel compression deformities of the thoracic spine with exaggerated thoracic kyphosis appearing largely remote in nature. These include: Pincer type deformity with 90% height loss T7 Pincer type deformity with 75% height loss T8 Concave superior endplate deformity with 20% height loss T9 Pincer deformity at T10 with near complete height loss centrally. Pincer deformity T11 with a 75% height loss. L1 superior endplate deformity, degree of height loss incompletely assessed. No other acute or  suspicious osseous injuries. Background of degenerative changes in the spine and shoulders. No worrisome chest wall mass or lesion. Posterior body wall edema. Review of the MIP images confirms the above findings. IMPRESSION: 1. Exam quality limited by extensive motion artifact. 2. No large central or lobar filling defects. 3. Cardiomegaly with biatrial enlargement. Reflux of contrast into the hepatic veins and IVC may reflect a degree of right heart failure or elevated right heart pressures. 4. Features suggesting developing interstitial edema with bilateral effusions, correlate with clinical features of CHF/volume overload. 5. Additional atelectatic changes in the lung bases. Underlying airspace disease or aspiration difficult to fully exclude. 6. Coronary artery atherosclerosis. Aortic Atherosclerosis (ICD10-I70.0). 7. Multilevel, remote appearing compression deformities throughout the thoracic spine and L1 level, as described above. Electronically Signed   By: Lovena Le M.D.   On: 08/02/2020 21:53   ECHOCARDIOGRAM COMPLETE  Result Date: 08/03/2020    ECHOCARDIOGRAM REPORT   Patient Name:   AHNESTY FINFROCK Date of Exam: 08/03/2020 Medical Rec #:  [id number]  Height:       67.0 in Accession #:    [id number] Weight:       239.0 lb Date of  Birth:  Dec 14, 1940  BSA:          2.181 m Patient Age:    89 years   BP:           103/77 mmHg Patient Gender: F          HR:           66 bpm. Exam Location:  Inpatient Procedure: 2D Echo, Cardiac Doppler and Color Doppler Indications:    CHF  History:        Patient has no prior history of Echocardiogram examinations.                 Cardiomegaly, Arrythmias:Atrial Fibrillation,                 Signs/Symptoms:Shortness of Breath and Resp. failure; Risk                 Factors:Hypertension, Dyslipidemia and Obesity.  Sonographer:    Dustin Flock Referring Phys: [phone] ANASTASSIA DOUTOVA  Sonographer Comments: Image acquisition challenging due to COPD and Image acquisition challenging due to patient body habitus. IMPRESSIONS  1. Left ventricular ejection fraction, by estimation, is 60 to 65%. The left ventricle has normal function. The left ventricle has no regional wall motion abnormalities. Left ventricular diastolic function could not be evaluated.  2. Right ventricular systolic function is normal. The right ventricular size is mildly enlarged.  3. The mitral valve is normal in structure. Trivial mitral valve regurgitation. No evidence of mitral stenosis.  4. The aortic valve is tricuspid. Aortic valve regurgitation is not visualized. Mild aortic valve sclerosis is present, with no evidence of aortic valve stenosis.  5. The inferior vena cava is normal in size with greater than 50% respiratory variability, suggesting right atrial pressure of 3 mmHg. FINDINGS  Left Ventricle: Left ventricular ejection fraction, by estimation, is 60 to 65%. The left ventricle has normal function. The left ventricle has no regional wall motion abnormalities. The left ventricular internal cavity size was normal in size. There is  no left ventricular hypertrophy. Left ventricular diastolic function could not be evaluated due to atrial fibrillation. Left ventricular diastolic function could not be evaluated. Normal left ventricular  filling pressure. Right Ventricle: The right ventricular size is mildly enlarged. No increase in right ventricular wall thickness. Right  ventricular systolic function is normal. Left Atrium: Left atrial size was normal in size. Right Atrium: Right atrial size was normal in size. Pericardium: There is no evidence of pericardial effusion. Mitral Valve: The mitral valve is normal in structure. Trivial mitral valve regurgitation. No evidence of mitral valve stenosis. Tricuspid Valve: The tricuspid valve is normal in structure. Tricuspid valve regurgitation is mild . No evidence of tricuspid stenosis. Aortic Valve: The aortic valve is tricuspid. Aortic valve regurgitation is not visualized. Mild aortic valve sclerosis is present, with no evidence of aortic valve stenosis. Pulmonic Valve: The pulmonic valve was normal in structure. Pulmonic valve regurgitation is trivial. No evidence of pulmonic stenosis. Aorta: The aortic root is normal in size and structure. Venous: The inferior vena cava was not well visualized. The inferior vena cava is normal in size with greater than 50% respiratory variability, suggesting right atrial pressure of 3 mmHg. IAS/Shunts: No atrial level shunt detected by color flow Doppler.  LEFT VENTRICLE PLAX 2D LVIDd:         5.30 cm     Diastology LVIDs:         2.80 cm     LV e' medial:    8.59 cm/s LV PW:         1.30 cm     LV E/e' medial:  13.2 LV IVS:        1.10 cm     LV e' lateral:   9.68 cm/s LVOT diam:     2.00 cm     LV E/e' lateral: 11.7 LV SV:         52 LV SV Index:   24 LVOT Area:     3.14 cm  LV Volumes (MOD) LV vol d, MOD A4C: 66.3 ml LV vol s, MOD A4C: 25.0 ml LV SV MOD A4C:     66.3 ml RIGHT VENTRICLE RV Basal diam:  3.70 cm RV S prime:     14.10 cm/s TAPSE (M-mode): 1.9 cm LEFT ATRIUM             Index       RIGHT ATRIUM           Index LA diam:        4.60 cm 2.11 cm/m  RA Area:     21.00 cm LA Vol (A2C):   53.7 ml 24.62 ml/m RA Volume:   60.70 ml  27.83 ml/m LA Vol (A4C):    79.4 ml 36.40 ml/m LA Biplane Vol: 71.2 ml 32.64 ml/m  AORTIC VALVE LVOT Vmax:   75.00 cm/s LVOT Vmean:  54.100 cm/s LVOT VTI:    0.164 m  AORTA Ao Root diam: 3.30 cm MITRAL VALVE                TRICUSPID VALVE MV Area (PHT): 4.44 cm     TR Peak grad:   34.3 mmHg MV Decel Time: 171 msec     TR Vmax:        293.00 cm/s MV E velocity: 113.00 cm/s                             SHUNTS                             Systemic VTI:  0.16 m  Systemic Diam: 2.00 cm Fransico Him MD Electronically signed by Fransico Him MD Signature Date/Time: 08/03/2020/9:38:21 AM    Final    DG ESOPHAGUS W SINGLE CM (SOL OR THIN BA)  Result Date: 08/04/2020 CLINICAL DATA:  Difficulty swallowing. Patient reports food getting stuck in throat. EXAM: ESOPHOGRAM / BARIUM SWALLOW / BARIUM TABLET STUDY TECHNIQUE: Combined double contrast and single contrast examination performed using effervescent crystals, thick barium liquid, and thin barium liquid. The patient was observed with fluoroscopy swallowing a 13 mm barium sulphate tablet. FLUOROSCOPY TIME:  Fluoroscopy Time:  2 minutes 36 seconds Radiation Exposure Index (if provided by the fluoroscopic device): 48.2 mGy Number of Acquired Spot Images: 0 COMPARISON:  None. FINDINGS: The esophagus appears patulous and increased in caliber. There is smoothly marginated beak like narrowing of the distal esophagus up to the GE junction. No malignant appearing stricture or mass noted. The motility of the esophagus is diffusely abnormal. There is significant scratch set significantly diminished primary peristalsis with pooling of barium in the esophagus. Uncoordinated, non propulsive tertiary contractions are noted . A 13 mm barium tablet was ingested which remains static within the dilated portion of the mid to distal esophagus. Despite multiple attempts to clear the pill there was insufficient peristalsis to push the pill through the dilated and patulous portions of the  esophagus. No hiatal hernia or signs of reflux identified. IMPRESSION: 1. 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 Electronically Signed   By: Kerby Moors M.D.   On: 08/04/2020 09:38    Impression: Dysphagia x 1 month, esophagram suggestive of achalasia  Acute on chronic diastolic heart failure  Chronic hypoxemic respiratory failure  A fib, on Eliquis: last dose 7/13 AM  Plan: Diagnostic EGD tomorrow.  I thoroughly discussed the procedure with the patient to include nature, alternatives, benefits (possible diagnosis), and risks (including but not limited to bleeding, infection, perforation, anesthesia/cardiac and pulmonary complications). Patient verbalized understanding and gave verbal consent to proceed with EGD.  Continue to hold Eliquis.  NPO at midnight.  Protonix 40 mg IV BID.  [facility] will follow.   LOS: 2 days   Salley Slaughter  [name] 08/04/2020, 1:58 PM  Contact #  [phone]

## 2020-08-04 NOTE — Progress Notes (Addendum)
Progress Note  [name] Date of Encounter: 08/04/2020  Primary Cardiologist: Pixie Casino, MD  Subjective   Patient out of room for barium eval, cardiology team will re-evaluate later when patient available.  Inpatient Medications    Scheduled Meds:  apixaban  5 mg Oral BID   atorvastatin  40 mg Oral Daily   furosemide  40 mg Intravenous Q12H   ipratropium-albuterol  3 mL Inhalation TID   LORazepam  1 mg Oral BID   metoprolol succinate  50 mg Oral BID   mirtazapine  45 mg Oral QHS   phenazopyridine  100 mg Oral TID WC   sertraline  100 mg Oral Daily   Continuous Infusions:  PRN Meds: acetaminophen, albuterol   Vital Signs    Vitals:   08/03/20 2048 08/04/20 0531 08/04/20 0614 08/04/20 0820  BP: 127/83 107/72    Pulse: 80 (!) 59    Resp: 16 18    Temp: 97.9 F (36.6 C) (!) 97.5 F (36.4 C)    TempSrc: Oral Oral    SpO2: 93% 95%  95%  Weight:   108 kg   Height:        Intake/Output Summary (Last 24 hours) at 08/04/2020 0843 Last data filed at 08/04/2020 0551 Gross per 24 hour  Intake 240 ml  Output 500 ml  Net -260 ml   Last 3 Weights 08/04/2020 08/03/2020 08/02/2020  Weight (lbs) 238 lb 1.6 oz 238 lb 15.7 oz 250 lb  Weight (kg) 108 kg 108.4 kg 113.399 kg     Telemetry    Pending MD review  Physical Exam   Pending MD review  Labs    High Sensitivity Troponin:   Recent Labs  Lab 08/02/20 1213 08/02/20 1448  TROPONINIHS 7 11      Cardiac EnzymesNo results for input(s): TROPONINI in the last 168 hours. No results for input(s): TROPIPOC in the last 168 hours.   Chemistry Recent Labs  Lab 08/02/20 1213 08/02/20 1935 08/03/20 0359 08/04/20 0359  NA 131* 134* 133* 135  K 3.2* 3.2* 3.1* 3.6  CL 86* 83* 88* 87*  CO2 35* 37* 34* 37*  GLUCOSE 122* 92 96 105*  BUN 14 14 12 15   CREATININE 0.78 0.77 0.65 0.96  CALCIUM 9.2 9.6 9.4 9.3  PROT 6.8  --  7.5  --   ALBUMIN 3.7  --  4.0  --   AST 15  --  16  --   ALT 13  --  15   --   ALKPHOS 71  --  71  --   BILITOT 0.6  --  0.6  --   GFRNONAA >60 >60 >60 60*  ANIONGAP 10 14 11 11      Hematology Recent Labs  Lab 08/02/20 1213 08/03/20 0359 08/04/20 0359  WBC 8.8 8.3 7.5  RBC 4.16 4.41 4.63  HGB 13.0 13.7 14.4  HCT 37.9 41.5 43.8  MCV 91.1 94.1 94.6  MCH 31.3 31.1 31.1  MCHC 34.3 33.0 32.9  RDW 13.5 13.6 14.1  PLT 309 287 296    BNP Recent Labs  Lab 08/02/20 1213  BNP 305.4*     DDimer No results for input(s): DDIMER in the last 168 hours.   Radiology    CT Angio Chest Pulmonary Embolism (PE) W or WO Contrast  Result Date: 08/02/2020 CLINICAL DATA:  Shortness of breath, chronic respiratory failure history of asthma and atrial fibrillation EXAM: CT ANGIOGRAPHY CHEST WITH CONTRAST TECHNIQUE: Multidetector  CT imaging of the chest was performed using the standard protocol during bolus administration of intravenous contrast. Multiplanar CT image reconstructions and MIPs were obtained to evaluate the vascular anatomy. CONTRAST:  19mL OMNIPAQUE IOHEXOL 350 MG/ML SOLN COMPARISON:  Radiograph 08/02/2020 FINDINGS: Cardiovascular: Exam quality is limited by extensive respiratory motion artifact throughout both lungs which may limit detection of smaller segmental and subsegmental pulmonary artery emboli. Otherwise, there is satisfactory opacification of the pulmonary arteries without large central or lobar filling defects identified. Central pulmonary arteries are normal caliber. Mild cardiomegaly with biatrial enlargement. Coronary artery calcifications. No pericardial effusion. Suboptimal opacification of the thoracic aorta for complete luminal assessment. Atherosclerotic plaque within the normal caliber aorta. Tortuosity of the otherwise normally branching great vessels. Tortuosity of the distal thoracic aorta noted as well. There is notable venous reflux into the hepatic veins and IVC. Mediastinum/Nodes: No mediastinal fluid or gas. No concerning dominant thyroid  nodule. No acute abnormality of the trachea or esophagus. No worrisome mediastinal, hilar or axillary adenopathy. Lungs/Pleura: Bilateral pleural effusions, right greater than left. Adjacent areas of likely passive atelectatic change. No other focal consolidative process is evident. There is however some notable pulmonary vascular redistribution with interlobular septal and fissural thickening which suggests some degree of superimposed pulmonary edema. No concerning pulmonary nodules or masses though evaluation is significantly limited by motion artifact. Upper Abdomen: Calcifications the spleen may reflect sequela of prior granulomatous disease. No acute abnormalities present in the visualized portions of the upper abdomen. Musculoskeletal: Multilevel compression deformities of the thoracic spine with exaggerated thoracic kyphosis appearing largely remote in nature. These include: Pincer type deformity with 90% height loss T7 Pincer type deformity with 75% height loss T8 Concave superior endplate deformity with 20% height loss T9 Pincer deformity at T10 with near complete height loss centrally. Pincer deformity T11 with a 75% height loss. L1 superior endplate deformity, degree of height loss incompletely assessed. No other acute or suspicious osseous injuries. Background of degenerative changes in the spine and shoulders. No worrisome chest wall mass or lesion. Posterior body wall edema. Review of the MIP images confirms the above findings. IMPRESSION: 1. Exam quality limited by extensive motion artifact. 2. No large central or lobar filling defects. 3. Cardiomegaly with biatrial enlargement. Reflux of contrast into the hepatic veins and IVC may reflect a degree of right heart failure or elevated right heart pressures. 4. Features suggesting developing interstitial edema with bilateral effusions, correlate with clinical features of CHF/volume overload. 5. Additional atelectatic changes in the lung bases. Underlying  airspace disease or aspiration difficult to fully exclude. 6. Coronary artery atherosclerosis. Aortic Atherosclerosis (ICD10-I70.0). 7. Multilevel, remote appearing compression deformities throughout the thoracic spine and L1 level, as described above. Electronically Signed   By: Lovena Le M.D.   On: 08/02/2020 21:53   DG Chest Port 1 View  Result Date: 08/02/2020 CLINICAL DATA:  Chest pain, shortness of breath EXAM: PORTABLE CHEST 1 VIEW COMPARISON:  None. FINDINGS: Cardiomegaly. Small bilateral pleural effusions with bibasilar atelectasis or infiltrates. No acute bony abnormality. IMPRESSION: Small bilateral pleural effusions with bibasilar atelectasis or infiltrates. Electronically Signed   By: Rolm Baptise M.D.   On: 08/02/2020 12:27   ECHOCARDIOGRAM COMPLETE  Result Date: 08/03/2020    ECHOCARDIOGRAM REPORT   Patient Name:   Lindsay [name] Date of Exam: 08/03/2020 Medical Rec #:  [id number]  Height:       67.0 in Accession #:    [id number] Weight:       239.0 lb  Date of Birth:  [birth year]  BSA:          2.181 m Patient Age:    66 years   BP:           103/77 mmHg Patient Gender: F          HR:           66 bpm. Exam Location:  Inpatient Procedure: 2D Echo, Cardiac Doppler and Color Doppler Indications:    CHF  History:        Patient has no prior history of Echocardiogram examinations.                 Cardiomegaly, Arrythmias:Atrial Fibrillation,                 Signs/Symptoms:Shortness of Breath and Resp. failure; Risk                 Factors:Hypertension, Dyslipidemia and Obesity.  Sonographer:    Dustin Flock Referring Phys: [phone] ANASTASSIA DOUTOVA  Sonographer Comments: Image acquisition challenging due to COPD and Image acquisition challenging due to patient body habitus. IMPRESSIONS  1. Left ventricular ejection fraction, by estimation, is 60 to 65%. The left ventricle has normal function. The left ventricle has no regional wall motion abnormalities. Left ventricular diastolic function could not  be evaluated.  2. Right ventricular systolic function is normal. The right ventricular size is mildly enlarged.  3. The mitral valve is normal in structure. Trivial mitral valve regurgitation. No evidence of mitral stenosis.  4. The aortic valve is tricuspid. Aortic valve regurgitation is not visualized. Mild aortic valve sclerosis is present, with no evidence of aortic valve stenosis.  5. The inferior vena cava is normal in size with greater than 50% respiratory variability, suggesting right atrial pressure of 3 mmHg. FINDINGS  Left Ventricle: Left ventricular ejection fraction, by estimation, is 60 to 65%. The left ventricle has normal function. The left ventricle has no regional wall motion abnormalities. The left ventricular internal cavity size was normal in size. There is  no left ventricular hypertrophy. Left ventricular diastolic function could not be evaluated due to atrial fibrillation. Left ventricular diastolic function could not be evaluated. Normal left ventricular filling pressure. Right Ventricle: The right ventricular size is mildly enlarged. No increase in right ventricular wall thickness. Right ventricular systolic function is normal. Left Atrium: Left atrial size was normal in size. Right Atrium: Right atrial size was normal in size. Pericardium: There is no evidence of pericardial effusion. Mitral Valve: The mitral valve is normal in structure. Trivial mitral valve regurgitation. No evidence of mitral valve stenosis. Tricuspid Valve: The tricuspid valve is normal in structure. Tricuspid valve regurgitation is mild . No evidence of tricuspid stenosis. Aortic Valve: The aortic valve is tricuspid. Aortic valve regurgitation is not visualized. Mild aortic valve sclerosis is present, with no evidence of aortic valve stenosis. Pulmonic Valve: The pulmonic valve was normal in structure. Pulmonic valve regurgitation is trivial. No evidence of pulmonic stenosis. Aorta: The aortic root is normal in size  and structure. Venous: The inferior vena cava was not well visualized. The inferior vena cava is normal in size with greater than 50% respiratory variability, suggesting right atrial pressure of 3 mmHg. IAS/Shunts: No atrial level shunt detected by color flow Doppler.  LEFT VENTRICLE PLAX 2D LVIDd:         5.30 cm     Diastology LVIDs:         2.80 cm  LV e' medial:    8.59 cm/s LV PW:         1.30 cm     LV E/e' medial:  13.2 LV IVS:        1.10 cm     LV e' lateral:   9.68 cm/s LVOT diam:     2.00 cm     LV E/e' lateral: 11.7 LV SV:         52 LV SV Index:   24 LVOT Area:     3.14 cm  LV Volumes (MOD) LV vol d, MOD A4C: 66.3 ml LV vol s, MOD A4C: 25.0 ml LV SV MOD A4C:     66.3 ml RIGHT VENTRICLE RV Basal diam:  3.70 cm RV S prime:     14.10 cm/s TAPSE (M-mode): 1.9 cm LEFT ATRIUM             Index       RIGHT ATRIUM           Index LA diam:        4.60 cm 2.11 cm/m  RA Area:     21.00 cm LA Vol (A2C):   53.7 ml 24.62 ml/m RA Volume:   60.70 ml  27.83 ml/m LA Vol (A4C):   79.4 ml 36.40 ml/m LA Biplane Vol: 71.2 ml 32.64 ml/m  AORTIC VALVE LVOT Vmax:   75.00 cm/s LVOT Vmean:  54.100 cm/s LVOT VTI:    0.164 m  AORTA Ao Root diam: 3.30 cm MITRAL VALVE                TRICUSPID VALVE MV Area (PHT): 4.44 cm     TR Peak grad:   34.3 mmHg MV Decel Time: 171 msec     TR Vmax:        293.00 cm/s MV E velocity: 113.00 cm/s                             SHUNTS                             Systemic VTI:  0.16 m                             Systemic Diam: 2.00 cm Fransico Him MD Electronically signed by Fransico Him MD Signature Date/Time: 08/03/2020/9:38:21 AM    Final     Cardiac Studies   2D Echo 08/03/20  1. Left ventricular ejection fraction, by estimation, is 60 to 65%. The  left ventricle has normal function. The left ventricle has no regional  wall motion abnormalities. Left ventricular diastolic function could not  be evaluated.   2. Right ventricular systolic function is normal. The right ventricular   size is mildly enlarged.   3. The mitral valve is normal in structure. Trivial mitral valve  regurgitation. No evidence of mitral stenosis.   4. The aortic valve is tricuspid. Aortic valve regurgitation is not  visualized. Mild aortic valve sclerosis is present, with no evidence of  aortic valve stenosis.   5. The inferior vena cava is normal in size with greater than 50%  respiratory variability, suggesting right atrial pressure of 3 mmHg.   Patient Profile     80 y.o. 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  Assessment & Plan    1. 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 Atrial fibrillation, suspected paroxysmal - currently rate controlled, continue present regimen of Eliquis and metoprolol and follow on telemetry - left atrial size normal - per MD, continue Eliquis and consider outpatient DCCV after 3 weeks - will need to clarify with patient whether she had any missed doses prior to her arrival as she has already been on Eliquis PTA - daughter also has reported she thought afib was intermittent rather than persistent although we do not presently know if this is the case   3.  Exertional chest discomfort - vague symptoms over the last week with negative troponins this admission - coronary/aortic atherosclerosis noted on CT - patient's daughter denies prior dx of CAD or cath - could also be related to exertional hypoxia from supply/demand mismatch but will review ? ischemic eval with MD since LVEF normal - LDL 92 -> will titrate atorvastatin to 40mg  daily - If the patient is tolerating statin at time of follow-up appointment, would consider rechecking liver function/lipid panel in 6 weeks   4. Hypokalemia, hyponatremia, relative hypomagnesemia. suspect related to metolazone use - last K 3.6 - > start Kcl 66meq BID while on IV Lasix - add on Mg level post-repletion - hyponatremia resolved   5. Other medical issues including: - possible UTI/dysuria - dysphagia with solid foods/medications -> barium swallow under way - further eval per medical team   6. Obesity - consider sleep study as OP given AF/CHF and BMI>30  Telemetry and exam pending MD review.  For questions or updates, please contact [place] Please consult [email or web address] for contact info under Cardiology/STEMI.  Signed, Charlie Pitter, [name] (prepped remotely) 08/04/2020, 8:43 AM

## 2020-08-04 NOTE — Progress Notes (Signed)
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 fib and therapeutic on Eliquis, anxiety, hypertension hyperlipidemia presented to the emergency room for evaluation of shortness of breath that is progressive, hypoxia and difficulty eating food. In the emergency room on attempted ambulation, patient was 79% on 2 L oxygen.  Clinical evidence of congestive heart failure so admitted to the hospital with cardiology consultation.   Assessment & Plan:   Principal Problem:   Acute diastolic CHF (congestive heart failure) (HCC) Active Problems:   Acute on chronic respiratory failure with hypoxia (HCC)   Hyponatremia   Hypokalemia   Dysphagia   Asthma   AF (paroxysmal atrial fibrillation) (HCC)   Chronic anticoagulation   Acquired thrombophilia (HCC)  Acute diastolic congestive heart failure: Echocardiogram with normal ejection fraction. Probable diastolic dysfunction with long history of hypertension and A. fib. Clinical evidence of fluid overload, treated with IV diuresis with good response.  Continue IV Lasix with aggressive potassium replacement.  Intake and output monitoring.  Followed by cardiology.  Acute on chronic hypoxemic respiratory failure: Multifactorial.  Due to acute diastolic dysfunction, restrictive lung disease and combination of factors.  Keep on oxygen to keep saturation more than 90%.  Will need new oxygen assessment on discharge.  Dysphagia: Seen by speech therapy.  No evidence of oropharyngeal dysphagia.  Barium esophagogram today with patulous esophagus, tapering off of the lower esophageal.  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  This explains patient's choking episodes and recurrent  aspiration and gurgling. Will benefit with upper GI endoscopy with visualization and stretching if able.  Eliquis dose 7/13 morning, will keep off Eliquis and consult gastroenterology.  Paroxysmal A. fib: Rate controlled.  Therapeutic on Eliquis.  On metoprolol.  Eliquis on hold 7/13 AM.  Hyponatremia: Dilutional hyponatremia due to congestive heart failure.  No indication for further intervention to increase sodium.  On diuresis.  Urinary frequency and hesitancy: UA with no evidence of infection.  Use Pyridium for symptomatic relief.  Work with PT OT.  Start mobilizing.  Monitor oxygen level and need for supplemental oxygen.  DVT prophylaxis:   Eliquis.   Code Status: Full code Family Communication: Patient's daughter Ms. Claiborne Billings at bedside. Disposition Plan: Status is: Inpatient  Remains inpatient appropriate because:Inpatient level of care appropriate due to severity of illness  Dispo: The patient is from:  [facility]              Anticipated d/c is to: Independent living.              Patient currently is not medically stable to d/c.   Difficult to place patient No         Consultants:  Cardiology  Procedures:  None  Antimicrobials:  None   Subjective: Patient seen and examined.  Looks fairly comfortable on 2 L oxygen.  Going for x-ray. Has difficulty eating food, she feels like her food is stuck in the food pipe and had episodes of regurgitation with old food.  Objective: Vitals:   08/04/20 0531 08/04/20 0614 08/04/20 0820 08/04/20 1327  BP: 107/72   116/72  Pulse: (!) 59   (!) 56  Resp: 18   16  Temp: (!) 97.5 F (36.4 C)   99.2 F (37.3 C)  TempSrc: Oral   Oral  SpO2: 95%  95% (!) 86%  Weight:  108 kg    Height:        Intake/Output Summary (Last 24 hours) at 08/04/2020 1417 Last data filed at 08/04/2020 1045 Gross per 24 hour  Intake 480 ml  Output 1325 ml  Net -845 ml   Filed Weights   08/02/20 1206 08/03/20 0500 08/04/20 0614  Weight: 113.4 kg 108.4  kg 108 kg    Examination:  General exam: Appears calm and comfortable at rest.  Currently on 2 L oxygen. Respiratory system: Mostly upper airway sounds.  Audible gurgling sounds. Cardiovascular system: S1 & S2 heard, irregularly irregular.  No edema.   Gastrointestinal system: Abdomen is nondistended, soft and nontender. No organomegaly or masses felt. Normal bowel sounds heard. Central nervous system: Alert and oriented. No focal neurological deficits. Extremities: Symmetric 5 x 5 power. Skin: No rashes, lesions or ulcers Psychiatry: Judgement and insight appear normal. Mood & affect appropriate.     Data Reviewed: I have personally reviewed following labs and imaging studies  CBC: Recent Labs  Lab 08/02/20 1213 08/03/20 0359 08/04/20 0359  WBC 8.8 8.3 7.5  NEUTROABS  --  6.4  --   HGB 13.0 13.7 14.4  HCT 37.9 41.5 43.8  MCV 91.1 94.1 94.6  PLT 309 287 767   Basic Metabolic Panel: Recent Labs  Lab 08/02/20 1213 08/02/20 1935 08/03/20 0359 08/04/20 0359  NA 131* 134* 133* 135  K 3.2* 3.2* 3.1* 3.6  CL 86* 83* 88* 87*  CO2 35* 37* 34* 37*  GLUCOSE 122* 92 96 105*  BUN 14 14 12 15   CREATININE 0.78 0.77 0.65 0.96  CALCIUM 9.2 9.6 9.4 9.3  MG  --  1.7 1.7 2.2  PHOS  --  3.8 3.9  --    GFR: Estimated Creatinine Clearance: 59.2 mL/min (by C-G formula based on SCr of 0.96 mg/dL). Liver Function Tests: Recent Labs  Lab 08/02/20 1213 08/03/20 0359  AST 15 16  ALT 13 15  ALKPHOS 71 71  BILITOT 0.6 0.6  PROT 6.8 7.5  ALBUMIN 3.7 4.0   No results for input(s): LIPASE, AMYLASE in the last 168 hours. No results for input(s): AMMONIA in the last 168 hours. Coagulation Profile: No results for input(s): INR, PROTIME in the last 168 hours. Cardiac Enzymes: Recent Labs  Lab 08/02/20 1935  CKTOTAL 55   BNP (last 3 results) No results for input(s): PROBNP in the last 8760 hours. HbA1C: No results for input(s): HGBA1C in the last 72 hours. CBG: No results for  input(s): GLUCAP in the last 168 hours. Lipid Profile: Recent Labs    08/04/20 0359  CHOL 179  HDL 74  LDLCALC 92  TRIG 64  CHOLHDL 2.4   Thyroid Function Tests: Recent Labs    08/03/20 0359  TSH 1.648   Anemia Panel: No results for input(s): VITAMINB12, FOLATE, FERRITIN, TIBC, IRON, RETICCTPCT in the last 72 hours. Sepsis Labs: No results for input(s): PROCALCITON, LATICACIDVEN in the last 168 hours.  Recent Results (from the past 240 hour(s))  Resp Panel by RT-PCR (Flu A&B, Covid)     Status: None   Collection Time: 08/02/20  1:03 PM  Result Value Ref Range Status   SARS Coronavirus 2 by RT PCR NEGATIVE NEGATIVE Final    Comment: (NOTE) SARS-CoV-2 target nucleic acids are NOT DETECTED.  The SARS-CoV-2 RNA is generally detectable in upper respiratory specimens during the acute phase of infection. The  lowest concentration of SARS-CoV-2 viral copies this assay can detect is 138 copies/mL. A negative result does not preclude SARS-Cov-2 infection and should not be used as the sole basis for treatment or other patient management decisions. A negative result may occur with  improper specimen collection/handling, submission of specimen other than nasopharyngeal swab, presence of viral mutation(s) within the areas targeted by this assay, and inadequate number of viral copies(<138 copies/mL). A negative result must be combined with clinical observations, patient history, and epidemiological information. The expected result is Negative.  Fact Sheet for Patients:  EntrepreneurPulse.com.au  Fact Sheet for Healthcare Providers:  IncredibleEmployment.be  This test is no t yet approved or cleared by the [place] FDA and  has been authorized for detection and/or diagnosis of SARS-CoV-2 by FDA under an Emergency Use Authorization (EUA). This EUA will remain  in effect (meaning this test can be used) for the duration of the COVID-19  declaration under Section 564(b)(1) of the Act, 21 U.S.C.section 360bbb-3(b)(1), unless the authorization is terminated  or revoked sooner.       Influenza A by PCR NEGATIVE NEGATIVE Final   Influenza B by PCR NEGATIVE NEGATIVE Final    Comment: (NOTE) The Xpert Xpress SARS-CoV-2/FLU/RSV plus assay is intended as an aid in the diagnosis of influenza from Nasopharyngeal swab specimens and should not be used as a sole basis for treatment. Nasal washings and aspirates are unacceptable for Xpert Xpress SARS-CoV-2/FLU/RSV testing.  Fact Sheet for Patients: EntrepreneurPulse.com.au  Fact Sheet for Healthcare Providers: IncredibleEmployment.be  This test is not yet approved or cleared by the [place] FDA and has been authorized for detection and/or diagnosis of SARS-CoV-2 by FDA under an Emergency Use Authorization (EUA). This EUA will remain in effect (meaning this test can be used) for the duration of the COVID-19 declaration under Section 564(b)(1) of the Act, 21 U.S.C. section 360bbb-3(b)(1), unless the authorization is terminated or revoked.  Performed at [facility], [address] [place],  [address]          Radiology Studies: CT Angio Chest Pulmonary Embolism (PE) W or WO Contrast  Result Date: 08/02/2020 CLINICAL DATA:  Shortness of breath, chronic respiratory failure history of asthma and atrial fibrillation EXAM: CT ANGIOGRAPHY CHEST WITH CONTRAST TECHNIQUE: Multidetector CT imaging of the chest was performed using the standard protocol during bolus administration of intravenous contrast. Multiplanar CT image reconstructions and MIPs were obtained to evaluate the vascular anatomy. CONTRAST:  44mL OMNIPAQUE IOHEXOL 350 MG/ML SOLN COMPARISON:  Radiograph 08/02/2020 FINDINGS: Cardiovascular: Exam quality is limited by extensive respiratory motion artifact throughout both lungs which may limit detection of  smaller segmental and subsegmental pulmonary artery emboli. Otherwise, there is satisfactory opacification of the pulmonary arteries without large central or lobar filling defects identified. Central pulmonary arteries are normal caliber. Mild cardiomegaly with biatrial enlargement. Coronary artery calcifications. No pericardial effusion. Suboptimal opacification of the thoracic aorta for complete luminal assessment. Atherosclerotic plaque within the normal caliber aorta. Tortuosity of the otherwise normally branching great vessels. Tortuosity of the distal thoracic aorta noted as well. There is notable venous reflux into the hepatic veins and IVC. Mediastinum/Nodes: No mediastinal fluid or gas. No concerning dominant thyroid nodule. No acute abnormality of the trachea or esophagus. No worrisome mediastinal, hilar or axillary adenopathy. Lungs/Pleura: Bilateral pleural effusions, right greater than left. Adjacent areas of likely passive atelectatic change. No other focal consolidative process is evident. There is however some notable pulmonary vascular redistribution with interlobular septal and fissural thickening  which suggests some degree of superimposed pulmonary edema. No concerning pulmonary nodules or masses though evaluation is significantly limited by motion artifact. Upper Abdomen: Calcifications the spleen may reflect sequela of prior granulomatous disease. No acute abnormalities present in the visualized portions of the upper abdomen. Musculoskeletal: Multilevel compression deformities of the thoracic spine with exaggerated thoracic kyphosis appearing largely remote in nature. These include: Pincer type deformity with 90% height loss T7 Pincer type deformity with 75% height loss T8 Concave superior endplate deformity with 20% height loss T9 Pincer deformity at T10 with near complete height loss centrally. Pincer deformity T11 with a 75% height loss. L1 superior endplate deformity, degree of height loss  incompletely assessed. No other acute or suspicious osseous injuries. Background of degenerative changes in the spine and shoulders. No worrisome chest wall mass or lesion. Posterior body wall edema. Review of the MIP images confirms the above findings. IMPRESSION: 1. Exam quality limited by extensive motion artifact. 2. No large central or lobar filling defects. 3. Cardiomegaly with biatrial enlargement. Reflux of contrast into the hepatic veins and IVC may reflect a degree of right heart failure or elevated right heart pressures. 4. Features suggesting developing interstitial edema with bilateral effusions, correlate with clinical features of CHF/volume overload. 5. Additional atelectatic changes in the lung bases. Underlying airspace disease or aspiration difficult to fully exclude. 6. Coronary artery atherosclerosis. Aortic Atherosclerosis (ICD10-I70.0). 7. Multilevel, remote appearing compression deformities throughout the thoracic spine and L1 level, as described above. Electronically Signed   By: Lovena Le M.D.   On: 08/02/2020 21:53   ECHOCARDIOGRAM COMPLETE  Result Date: 08/03/2020    ECHOCARDIOGRAM REPORT   Patient Name:   Lindsay [name] Date of Exam: 08/03/2020 Medical Rec #:  [id number]  Height:       67.0 in Accession #:    [id number] Weight:       239.0 lb Date of Birth:  Sep 08, 1940  BSA:          2.181 m Patient Age:    54 years   BP:           103/77 mmHg Patient Gender: F          HR:           66 bpm. Exam Location:  Inpatient Procedure: 2D Echo, Cardiac Doppler and Color Doppler Indications:    CHF  History:        Patient has no prior history of Echocardiogram examinations.                 Cardiomegaly, Arrythmias:Atrial Fibrillation,                 Signs/Symptoms:Shortness of Breath and Resp. failure; Risk                 Factors:Hypertension, Dyslipidemia and Obesity.  Sonographer:    Dustin Flock Referring Phys: [phone] ANASTASSIA DOUTOVA  Sonographer Comments: Image acquisition  challenging due to COPD and Image acquisition challenging due to patient body habitus. IMPRESSIONS  1. Left ventricular ejection fraction, by estimation, is 60 to 65%. The left ventricle has normal function. The left ventricle has no regional wall motion abnormalities. Left ventricular diastolic function could not be evaluated.  2. Right ventricular systolic function is normal. The right ventricular size is mildly enlarged.  3. The mitral valve is normal in structure. Trivial mitral valve regurgitation. No evidence of mitral stenosis.  4. The aortic valve is tricuspid. Aortic valve regurgitation is not visualized. Mild aortic  valve sclerosis is present, with no evidence of aortic valve stenosis.  5. The inferior vena cava is normal in size with greater than 50% respiratory variability, suggesting right atrial pressure of 3 mmHg. FINDINGS  Left Ventricle: Left ventricular ejection fraction, by estimation, is 60 to 65%. The left ventricle has normal function. The left ventricle has no regional wall motion abnormalities. The left ventricular internal cavity size was normal in size. There is  no left ventricular hypertrophy. Left ventricular diastolic function could not be evaluated due to atrial fibrillation. Left ventricular diastolic function could not be evaluated. Normal left ventricular filling pressure. Right Ventricle: The right ventricular size is mildly enlarged. No increase in right ventricular wall thickness. Right ventricular systolic function is normal. Left Atrium: Left atrial size was normal in size. Right Atrium: Right atrial size was normal in size. Pericardium: There is no evidence of pericardial effusion. Mitral Valve: The mitral valve is normal in structure. Trivial mitral valve regurgitation. No evidence of mitral valve stenosis. Tricuspid Valve: The tricuspid valve is normal in structure. Tricuspid valve regurgitation is mild . No evidence of tricuspid stenosis. Aortic Valve: The aortic valve is  tricuspid. Aortic valve regurgitation is not visualized. Mild aortic valve sclerosis is present, with no evidence of aortic valve stenosis. Pulmonic Valve: The pulmonic valve was normal in structure. Pulmonic valve regurgitation is trivial. No evidence of pulmonic stenosis. Aorta: The aortic root is normal in size and structure. Venous: The inferior vena cava was not well visualized. The inferior vena cava is normal in size with greater than 50% respiratory variability, suggesting right atrial pressure of 3 mmHg. IAS/Shunts: No atrial level shunt detected by color flow Doppler.  LEFT VENTRICLE PLAX 2D LVIDd:         5.30 cm     Diastology LVIDs:         2.80 cm     LV e' medial:    8.59 cm/s LV PW:         1.30 cm     LV E/e' medial:  13.2 LV IVS:        1.10 cm     LV e' lateral:   9.68 cm/s LVOT diam:     2.00 cm     LV E/e' lateral: 11.7 LV SV:         52 LV SV Index:   24 LVOT Area:     3.14 cm  LV Volumes (MOD) LV vol d, MOD A4C: 66.3 ml LV vol s, MOD A4C: 25.0 ml LV SV MOD A4C:     66.3 ml RIGHT VENTRICLE RV Basal diam:  3.70 cm RV S prime:     14.10 cm/s TAPSE (M-mode): 1.9 cm LEFT ATRIUM             Index       RIGHT ATRIUM           Index LA diam:        4.60 cm 2.11 cm/m  RA Area:     21.00 cm LA Vol (A2C):   53.7 ml 24.62 ml/m RA Volume:   60.70 ml  27.83 ml/m LA Vol (A4C):   79.4 ml 36.40 ml/m LA Biplane Vol: 71.2 ml 32.64 ml/m  AORTIC VALVE LVOT Vmax:   75.00 cm/s LVOT Vmean:  54.100 cm/s LVOT VTI:    0.164 m  AORTA Ao Root diam: 3.30 cm MITRAL VALVE  TRICUSPID VALVE MV Area (PHT): 4.44 cm     TR Peak grad:   34.3 mmHg MV Decel Time: 171 msec     TR Vmax:        293.00 cm/s MV E velocity: 113.00 cm/s                             SHUNTS                             Systemic VTI:  0.16 m                             Systemic Diam: 2.00 cm Fransico Him MD Electronically signed by Fransico Him MD Signature Date/Time: 08/03/2020/9:38:21 AM    Final    DG ESOPHAGUS W SINGLE CM (SOL OR THIN  BA)  Result Date: 08/04/2020 CLINICAL DATA:  Difficulty swallowing. Patient reports food getting stuck in throat. EXAM: ESOPHOGRAM / BARIUM SWALLOW / BARIUM TABLET STUDY TECHNIQUE: Combined double contrast and single contrast examination performed using effervescent crystals, thick barium liquid, and thin barium liquid. The patient was observed with fluoroscopy swallowing a 13 mm barium sulphate tablet. FLUOROSCOPY TIME:  Fluoroscopy Time:  2 minutes 36 seconds Radiation Exposure Index (if provided by the fluoroscopic device): 48.2 mGy Number of Acquired Spot Images: 0 COMPARISON:  None. FINDINGS: The esophagus appears patulous and increased in caliber. There is smoothly marginated beak like narrowing of the distal esophagus up to the GE junction. No malignant appearing stricture or mass noted. The motility of the esophagus is diffusely abnormal. There is significant scratch set significantly diminished primary peristalsis with pooling of barium in the esophagus. Uncoordinated, non propulsive tertiary contractions are noted . A 13 mm barium tablet was ingested which remains static within the dilated portion of the mid to distal esophagus. Despite multiple attempts to clear the pill there was insufficient peristalsis to push the pill through the dilated and patulous portions of the esophagus. No hiatal hernia or signs of reflux identified. IMPRESSION: 1. 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 Electronically Signed   By: Kerby Moors M.D.   On: 08/04/2020 09:38        Scheduled Meds:  atorvastatin  40 mg Oral Daily   furosemide  40 mg Intravenous Q12H   ipratropium-albuterol  3 mL Inhalation BID   LORazepam  1 mg Oral BID   metoprolol succinate  50 mg Oral BID   mirtazapine  45 mg Oral QHS   phenazopyridine  100 mg Oral TID WC   potassium chloride  20 mEq Oral BID   sertraline  100 mg Oral Daily   Continuous Infusions:   LOS: 2 days    Time spent:  32 minutes    Barb Merino, MD [facility] Pager [phone]

## 2020-08-05 ENCOUNTER — Encounter (HOSPITAL_COMMUNITY)
Admission: EM | Disposition: A | Discharge status: Home Health | Payer: Self-pay | Source: Home / Self Care | Attending: Internal Medicine

## 2020-08-05 ENCOUNTER — Inpatient Hospital Stay (HOSPITAL_COMMUNITY): Payer: Medicare (Managed Care) | Admitting: Anesthesiology

## 2020-08-05 ENCOUNTER — Encounter (HOSPITAL_COMMUNITY): Payer: Self-pay | Admitting: Internal Medicine

## 2020-08-05 DIAGNOSIS — I4819 Other persistent atrial fibrillation: Secondary | ICD-10-CM

## 2020-08-05 HISTORY — PX: ESOPHAGOGASTRODUODENOSCOPY: SHX5428

## 2020-08-05 LAB — BASIC METABOLIC PANEL
Anion gap: 11 (ref 5–15)
BUN: 27 mg/dL — ABNORMAL HIGH (ref 8–23)
CO2: 37 mmol/L — ABNORMAL HIGH (ref 22–32)
Calcium: 9 mg/dL (ref 8.9–10.3)
Chloride: 87 mmol/L — ABNORMAL LOW (ref 98–111)
Creatinine, Ser: 1.04 mg/dL — ABNORMAL HIGH (ref 0.44–1.00)
GFR, Estimated: 54 mL/min — ABNORMAL LOW (ref >60)
Glucose, Bld: 100 mg/dL — ABNORMAL HIGH (ref 70–99)
Potassium: 3 mmol/L — ABNORMAL LOW (ref 3.5–5.1)
Sodium: 135 mmol/L (ref 135–145)

## 2020-08-05 LAB — MAGNESIUM: Magnesium: 2.2 mg/dL (ref 1.7–2.4)

## 2020-08-05 SURGERY — EGD (ESOPHAGOGASTRODUODENOSCOPY)
Anesthesia: Monitor Anesthesia Care

## 2020-08-05 MED ORDER — POTASSIUM CHLORIDE CRYS ER 20 MEQ PO TBCR
40.0000 meq | EXTENDED_RELEASE_TABLET | Freq: Two times a day (BID) | ORAL | Status: DC
  Administered 2020-08-05 – 2020-08-06 (×3): 40 meq via ORAL
  Filled 2020-08-05 (×3): qty 2

## 2020-08-05 MED ORDER — PROPOFOL 500 MG/50ML IV EMUL
INTRAVENOUS | Status: DC | PRN
  Administered 2020-08-05: 125 ug/kg/min via INTRAVENOUS

## 2020-08-05 MED ORDER — LACTATED RINGERS IV SOLN: INTRAVENOUS | Status: DC

## 2020-08-05 MED ORDER — LIDOCAINE 2% (20 MG/ML) 5 ML SYRINGE
INTRAMUSCULAR | Status: DC | PRN
  Administered 2020-08-05: 100 mg via INTRAVENOUS

## 2020-08-05 MED ORDER — TORSEMIDE 20 MG PO TABS
40.0000 mg | ORAL_TABLET | Freq: Every day | ORAL | Status: DC
  Administered 2020-08-06: 40 mg via ORAL
  Filled 2020-08-05: qty 2

## 2020-08-05 MED ORDER — APIXABAN 5 MG PO TABS
5.0000 mg | ORAL_TABLET | Freq: Two times a day (BID) | ORAL | Status: DC
  Administered 2020-08-05 – 2020-08-06 (×2): 5 mg via ORAL
  Filled 2020-08-05 (×2): qty 1

## 2020-08-05 MED ORDER — PROPOFOL 500 MG/50ML IV EMUL
INTRAVENOUS | Status: DC | PRN
  Administered 2020-08-05: 20 mg via INTRAVENOUS

## 2020-08-05 NOTE — Progress Notes (Signed)
[facility] Gastroenterology Progress Note  Lindsay [name] 80 y.o. 06/22/1940  CC:   Dysphagia, abnormal barium swallow   Subjective: Patient seen and examined at bedside in Endo unit.  No acute issues.     Objective: Vital signs in last 24 hours: Vitals:   08/05/20 0929 08/05/20 1318  BP: 110/75 101/77  Pulse: 89 91  Resp:  (!) 24  Temp:  98.4 F (36.9 C)  SpO2:  97%    Physical Exam: General : Elderly patient.  Not in acute distress.  Oxygen via nasal cannula Lungs : Decreased breath sounds bilaterally Heart : Rate rhythm regular Abdomen : Minimally distended, soft, bowel sounds present.  No peritoneal sign Neuro : Alert and oriented x3 Psych : Mood and affect normal    Lab Results: Recent Labs    08/02/20 1935 08/03/20 0359 08/04/20 0359 08/05/20 0336  NA 134* 133* 135 135  K 3.2* 3.1* 3.6 3.0*  CL 83* 88* 87* 87*  CO2 37* 34* 37* 37*  GLUCOSE 92 96 105* 100*  BUN 14 12 15  27*  CREATININE 0.77 0.65 0.96 1.04*  CALCIUM 9.6 9.4 9.3 9.0  MG 1.7 1.7 2.2 2.2  PHOS 3.8 3.9  --   --    Recent Labs    08/03/20 0359  AST 16  ALT 15  ALKPHOS 71  BILITOT 0.6  PROT 7.5  ALBUMIN 4.0   Recent Labs    08/03/20 0359 08/04/20 0359  WBC 8.3 7.5  NEUTROABS 6.4  --   HGB 13.7 14.4  HCT 41.5 43.8  MCV 94.1 94.6  PLT 287 296   No results for input(s): LABPROT, INR in the last 72 hours.    Assessment/Plan:  -Abnormal barium swallow concerning for achalasia with dilated esophagus and lack of peristalsis.  13 mm pill not able to pass through GE junction because of lack of peristalsis.  There was no evidence of stricture or narrowing.  -History of atrial fibrillation.  Last dose of anticoagulation yesterday morning -History of COPD  -CHF     Recommendations  -------------------------  -Proceed with EGD today with possible dilation  Risks (bleeding, infection, bowel perforation that could require surgery, sedation-related changes in cardiopulmonary systems),  benefits (identification and possible treatment of source of symptoms, exclusion of certain causes of symptoms), and alternatives (watchful waiting, radiographic imaging studies, empiric medical treatment)  were explained to patient in detail and patient wishes to proceed.   Otis Brace MD, FACP 08/05/2020, 1:23 PM  Contact #  [phone]

## 2020-08-05 NOTE — Anesthesia Preprocedure Evaluation (Addendum)
Anesthesia Evaluation  Patient identified by MRN, date of birth, ID band Patient awake    Reviewed: Allergy & Precautions, NPO status , Patient's Chart, lab work & pertinent test results, reviewed documented beta blocker date and time   Airway Mallampati: III  TM Distance: >3 FB Neck ROM: Full    Dental no notable dental hx. (+) Teeth Intact, Dental Advisory Given   Pulmonary asthma (no inhalers) , former smoker,  Chronic hypoxic respiratory failure on home O2 at night RLD 2/2 scoliosis    Pulmonary exam normal breath sounds clear to auscultation       Cardiovascular hypertension, Pt. on medications and Pt. on home beta blockers +CHF (acute on chronic HF exacerbation this admission, is down 1.5L )  Normal cardiovascular exam+ dysrhythmias (eliquis LD 7/13) Atrial Fibrillation  Rhythm:Regular Rate:Normal  Echo 08/03/20: 1. Left ventricular ejection fraction, by estimation, is 60 to 65%. The  left ventricle has normal function. The left ventricle has no regional  wall motion abnormalities. Left ventricular diastolic function could not  be evaluated.  2. Right ventricular systolic function is normal. The right ventricular  size is mildly enlarged.  3. The mitral valve is normal in structure. Trivial mitral valve  regurgitation. No evidence of mitral stenosis.  4. The aortic valve is tricuspid. Aortic valve regurgitation is not  visualized. Mild aortic valve sclerosis is present, with no evidence of  aortic valve stenosis.  5. The inferior vena cava is normal in size with greater than 50%  respiratory variability, suggesting right atrial pressure of 3 mmHg.    Neuro/Psych negative neurological ROS  negative psych ROS   GI/Hepatic Neg liver ROS, Presented c/o trouble swallowing certain pills as well as coughing and choking while swallowing.  Speech evaluation was done which was normal.  Follow-up barium swallow showed finding  concerning for achalasia with dilated esophagus and lack of peristalsis.  GI is consulted for evaluation for EGD.    Endo/Other  BMI 38  Renal/GU negative Renal ROS  negative genitourinary   Musculoskeletal negative musculoskeletal ROS (+)   Abdominal   Peds  Hematology negative hematology ROS (+) hct 43.8   Anesthesia Other Findings   Reproductive/Obstetrics negative OB ROS                           Anesthesia Physical Anesthesia Plan  ASA: 4  Anesthesia Plan: MAC   Post-op Pain Management:    Induction:   PONV Risk Score and Plan: 2 and Propofol infusion and TIVA  Airway Management Planned: Natural Airway and Simple Face Mask  Additional Equipment: None  Intra-op Plan:   Post-operative Plan:   Informed Consent: I have reviewed the patients History and Physical, chart, labs and discussed the procedure including the risks, benefits and alternatives for the proposed anesthesia with the patient or authorized representative who has indicated his/her understanding and acceptance.     Dental advisory given  Plan Discussed with: CRNA  Anesthesia Plan Comments:       Anesthesia Quick Evaluation

## 2020-08-05 NOTE — Op Note (Signed)
[facility] [name] Procedure Date: 08/05/2020 MRN: [id number] Attending MD: Otis Brace , MD Date of Birth: [birth year] CSN: [id number] Age: 80 Admit Type: Inpatient Procedure:                Upper GI endoscopy Indications:              Oropharyngeal phase dysphagia, Abnormal esophagram Providers:                Otis Brace, MD, Josie Dixon, RN, Benetta Spar, Technician Referring MD:              Medicines:                Sedation Administered by an Anesthesia Professional Complications:            No immediate complications. Estimated Blood Loss:     Estimated blood loss: none. Procedure:                Pre-Anesthesia Assessment:                           - Prior to the procedure, a History and Physical                            was performed, and patient medications and                            allergies were reviewed. The patient's tolerance of                            previous anesthesia was also reviewed. The risks                            and benefits of the procedure and the sedation                            options and risks were discussed with the patient.                            All questions were answered, and informed consent                            was obtained. Prior Anticoagulants: The patient has                            taken Eliquis (apixaban), last dose was 1 day prior                            to procedure. ASA Grade Assessment: IV - A patient                            with severe systemic disease that is a constant  threat to life. After reviewing the risks and                            benefits, the patient was deemed in satisfactory                            condition to undergo the procedure.                           After obtaining informed consent, the endoscope was                            passed under direct vision. Throughout the                             procedure, the patient's blood pressure, pulse, and                            oxygen saturations were monitored continuously. The                            [id number]-H190 ([id number] Olympus gastroscope was                            introduced through the mouth, and advanced to the                            second part of duodenum. The upper GI endoscopy was                            accomplished without difficulty. The patient                            tolerated the procedure well. Scope In: Scope Out: Findings:      The lumen of the mid esophagus and distal esophagus was dilated.      Abnormal motility was noted in the esophagus. There is a decrease in       motility of the esophageal body. Small amount of retained liquid noted       in the esophagus which was removed with lavage and suction.      Scattered mild inflammation characterized by erythema was found in the       entire examined stomach.      The cardia and gastric fundus were normal on retroflexion.      The duodenal bulb, first portion of the duodenum and second portion of       the duodenum were normal. Impression:               - Dilation in the mid esophagus and in the distal                            esophagus.                           - Abnormal esophageal motility, suspicious for  achalasia.                           - Gastritis.                           - Normal duodenal bulb, first portion of the                            duodenum and second portion of the duodenum.                           - No specimens collected. Moderate Sedation:      Moderate (conscious) sedation was personally administered by an       anesthesia professional. The following parameters were monitored: oxygen       saturation, heart rate, blood pressure, and response to care. Recommendation:           - Return patient to hospital ward for ongoing care.                           - Soft  diet.                           - Continue present medications.                           - Return to GI clinic PRN. Procedure Code(s):        --- Professional ---                           [phone], Esophagogastroduodenoscopy, flexible,                            transoral; diagnostic, including collection of                            specimen(s) by brushing or washing, when performed                            (separate procedure) Diagnosis Code(s):        --- Professional ---                           K22.8, Other specified diseases of esophagus                           K22.4, Dyskinesia of esophagus                           K29.70, Gastritis, unspecified, without bleeding                           R13.12, Dysphagia, oropharyngeal phase                           R93.3, Abnormal findings on diagnostic imaging of  other parts of digestive tract CPT copyright 2019 [employer]. All rights reserved. The codes documented in this report are preliminary and upon coder review may  be revised to meet current compliance requirements. Otis Brace, MD Otis Brace, MD 08/05/2020 1:51:26 PM Number of Addenda: 0

## 2020-08-05 NOTE — Progress Notes (Addendum)
Occupational Therapy Treatment Patient Details Name: Lindsay [name] MRN: [id number] DOB: 1940/04/22 Today's Date: 08/05/2020    History of present illness Patient is an 80 year old female who presented to the hospital on 7/11 with  worsening SOB and hypoxia fatigue and weakness. Patient was admitted Acute diastolic congestive heart failure. KDT:OIZTIWP respiratory failure, asthma, A. fib , HLD osteoporosis   OT comments  Patient was noted to have made progress towards goals. Patient was min guard for transfer out of bed to [facility]. Patient was noted to have O2 saturation drop down to 88% on 2L/min while standing to complete hygiene with min A. Patient was educated on deep breathing strategies. Patients nurse was educated on patients O2 during session with nurse changing O2 reader to maintain good readings. Patient's d/c plan remains appropriate at this time.   Follow Up Recommendations  Home health OT;Supervision - Intermittent    Equipment Recommendations       Recommendations for Other Services      Precautions / Restrictions Precautions Precautions: Fall Precaution Comments: monitor sats O2 and HR       Mobility Bed Mobility                    Transfers                      Balance     Sitting balance-Leahy Scale: Good     Standing balance support: During functional activity Standing balance-Leahy Scale: Good                             ADL either performed or assessed with clinical judgement   ADL                           Toilet Transfer: Min Lobbyist Details (indicate cue type and reason): patient was too worn out from session with PT shortly before OT session Toileting- Clothing Manipulation and Hygiene: Minimal assistance;Sit to/from stand       Functional mobility during ADLs: Min guard;Rolling walker General ADL Comments: patient was noted to have O2 drop to 88% after toileting tasks on 2L./min. patient  was able to deep breath O2 back up 95% 2L     Vision       Perception     Praxis      Cognition Arousal/Alertness: Awake/alert Behavior During Therapy: WFL for tasks assessed/performed;Anxious Overall Cognitive Status: Within Functional Limits for tasks assessed                                          Exercises     Shoulder Instructions       General Comments      Pertinent Vitals/ Pain       Pain Assessment: No/denies pain  Home Living                                          Prior Functioning/Environment              Frequency  Min 2X/week        Progress Toward Goals  OT Goals(current goals can now be found in the care plan section)  Progress towards OT goals: Progressing toward goals  Acute Rehab OT Goals Patient Stated Goal: to get better at breathing and walking OT Goal Formulation: With patient Time For Goal Achievement: 08/17/20 Potential to Achieve Goals: Good  Plan Discharge plan remains appropriate    Co-evaluation                 AM-PAC OT "6 Clicks" Daily Activity     Outcome Measure   Help from another person eating meals?: None Help from another person taking care of personal grooming?: None Help from another person toileting, which includes using toliet, bedpan, or urinal?: A Little Help from another person bathing (including washing, rinsing, drying)?: A Little Help from another person to put on and taking off regular upper body clothing?: A Little Help from another person to put on and taking off regular lower body clothing?: A Little 6 Click Score: 20    End of Session Equipment Utilized During Treatment: Gait belt;Rolling walker  OT Visit Diagnosis: Unsteadiness on feet (R26.81);Muscle weakness (generalized) (M62.81)   Activity Tolerance Patient tolerated treatment well   Patient Left in chair;with call bell/phone within reach   Nurse Communication          Time:  1550-1603 OT Time Calculation (min): 13 min  Charges: OT General Charges $OT Visit: 1 Visit OT Treatments $Self Care/Home Management : 8-22 mins  Jackelyn Poling OTR/L, [place] Acute Rehabilitation Department Office# [phone] Pager# [phone]    [place] 08/05/2020, 4:24 PM

## 2020-08-05 NOTE — Anesthesia Postprocedure Evaluation (Signed)
Anesthesia Post Note  Patient: Lindsay [name]  Procedure(s) Performed: ESOPHAGOGASTRODUODENOSCOPY (EGD)     Patient location during evaluation: PACU Anesthesia Type: MAC Level of consciousness: awake and alert Pain management: pain level controlled Vital Signs Assessment: post-procedure vital signs reviewed and stable Respiratory status: spontaneous breathing, nonlabored ventilation and respiratory function stable Cardiovascular status: blood pressure returned to baseline and stable Postop Assessment: no apparent nausea or vomiting Anesthetic complications: no   No notable events documented.  Last Vitals:  Vitals:   08/05/20 1400 08/05/20 1410  BP: 122/78 120/70  Pulse: 91 76  Resp: (!) 28 (!) 24  Temp:    SpO2: 96% 95%    Last Pain:  Vitals:   08/05/20 1410  TempSrc:   PainSc: 0-No pain                 Pervis Hocking

## 2020-08-05 NOTE — Transfer of Care (Signed)
Immediate Anesthesia Transfer of Care Note  Patient: Lindsay [name]  Procedure(s) Performed: ESOPHAGOGASTRODUODENOSCOPY (EGD)  Patient Location: PACU and Endoscopy Unit  Anesthesia Type:MAC  Level of Consciousness: awake and drowsy  Airway & Oxygen Therapy: Patient Spontanous Breathing and Patient connected to face mask oxygen  Post-op Assessment: Report given to RN and Post -op Vital signs reviewed and stable  Post vital signs: Reviewed and stable  Last Vitals:  Vitals Value Taken Time  BP 109/81 08/05/20 1352  Temp 37 C 08/05/20 1352  Pulse 81 08/05/20 1355  Resp 23 08/05/20 1355  SpO2 99 % 08/05/20 1355  Vitals shown include unvalidated device data.  Last Pain:  Vitals:   08/05/20 1352  TempSrc: Oral  PainSc: Asleep      Patients Stated Pain Goal: 1 (67/89/38 1017)  Complications: No notable events documented.

## 2020-08-05 NOTE — Care Management Important Message (Signed)
Important Message  Patient Details IM Letter placed in Patient's room. Name: Lindsay [name] MRN: [id number] Date of Birth: 06-Nov-1940   Medicare Important Message Given:  Yes     Kerin Salen 08/05/2020, 2:29 PM

## 2020-08-05 NOTE — Progress Notes (Signed)
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 fib and therapeutic on Eliquis, anxiety, hypertension, hyperlipidemia presented to the emergency room for evaluation of shortness of breath that is progressive, hypoxia and difficulty eating food. In the emergency room on attempted ambulation, patient was 79% on 2 L oxygen.  Clinical evidence of congestive heart failure so admitted to the hospital with cardiology consultation.   Assessment & Plan:   Principal Problem:   Acute diastolic CHF (congestive heart failure) (HCC) Active Problems:   Acute on chronic respiratory failure with hypoxia (HCC)   Hyponatremia   Hypokalemia   Dysphagia   Asthma   AF (paroxysmal atrial fibrillation) (HCC)   Chronic anticoagulation   Acquired thrombophilia (HCC)  Acute diastolic congestive heart failure: Echocardiogram with normal ejection fraction. Probable diastolic dysfunction with long history of hypertension and A. fib. Clinical evidence of fluid overload, treated with IV diuresis with good response.  aggressive potassium replacement.  Intake and output monitoring.  Followed by cardiology. Changing to oral torsemide today.  Acute on chronic hypoxemic respiratory failure: Multifactorial.  Due to acute diastolic dysfunction, restrictive lung disease and combination of factors.  Keep on oxygen to keep saturation more than 90%.  Patient uses 2 L oxygen at home.  Dysphagia: Seen by speech therapy.  No evidence of oropharyngeal dysphagia.  Barium esophagogram with patulous esophagus, tapering off of the lower esophageal.  This is consistent with achalasia. Underwent upper GI endoscopy today with no strictures but consistent with  achalasia. Advised soft diet, Diet and lifestyle modification.  Paroxysmal A. fib: Rate controlled.  Therapeutic on Eliquis.  On metoprolol.  Resume Eliquis today.  Hyponatremia: Dilutional hyponatremia due to congestive heart failure.  No indication for further intervention to increase sodium.  On diuresis.  Urinary frequency and hesitancy: UA with no evidence of infection.  Use Pyridium for symptomatic relief.  Work with PT OT.  Anticipate discharge home tomorrow.  DVT prophylaxis:   Eliquis. apixaban (ELIQUIS) tablet 5 mg   Code Status: Full code Family Communication: Patient's daughter Ms. Claiborne Billings on the phone. Disposition Plan: Status is: Inpatient  Remains inpatient appropriate because:Inpatient level of care appropriate due to severity of illness  Dispo: The patient is from:  ILF              Anticipated d/c is to: Independent living.  With home health.              Patient currently is not medically stable to d/c.   Difficult to place patient No         Consultants:  Cardiology  Procedures:  None  Antimicrobials:  None   Subjective: Patient seen and examined in the morning rounds before going to procedure.  She denies any complaints.  She thinks her breathing is better.  Objective: Vitals:   08/05/20 1352 08/05/20 1400 08/05/20 1410 08/05/20 1426  BP: 109/81 122/78 120/70 108/81  Pulse: 78 91 76 86  Resp: (!) 24 (!) 28 (!) 24 20  Temp: 98.6 F (37 C)   98.3 F (36.8 C)  TempSrc: Oral   Oral  SpO2: 97% 96% 95% 96%  Weight:      Height:        Intake/Output Summary (Last 24 hours) at 08/05/2020  1430 Last data filed at 08/05/2020 1356 Gross per 24 hour  Intake 420 ml  Output 850 ml  Net -430 ml   Filed Weights   08/02/20 1206 08/03/20 0500 08/04/20 0614  Weight: 113.4 kg 108.4 kg 108 kg    Examination:  General exam: Appears calm and comfortable at rest.  Currently on 2 L oxygen. Respiratory system: Mostly bilateral clear.  She has some  conducted airway sounds. Cardiovascular system: S1 & S2 heard, irregularly irregular.  Trace bilateral pedal edema. Gastrointestinal system: Abdomen is nondistended, soft and nontender. No organomegaly or masses felt. Normal bowel sounds heard. Central nervous system: Alert and oriented. No focal neurological deficits. Extremities: Symmetric 5 x 5 power. Skin: No rashes, lesions or ulcers Psychiatry: Judgement and insight appear normal. Mood & affect appropriate.     Data Reviewed: I have personally reviewed following labs and imaging studies  CBC: Recent Labs  Lab 08/02/20 1213 08/03/20 0359 08/04/20 0359  WBC 8.8 8.3 7.5  NEUTROABS  --  6.4  --   HGB 13.0 13.7 14.4  HCT 37.9 41.5 43.8  MCV 91.1 94.1 94.6  PLT 309 287 409   Basic Metabolic Panel: Recent Labs  Lab 08/02/20 1213 08/02/20 1935 08/03/20 0359 08/04/20 0359 08/05/20 0336  NA 131* 134* 133* 135 135  K 3.2* 3.2* 3.1* 3.6 3.0*  CL 86* 83* 88* 87* 87*  CO2 35* 37* 34* 37* 37*  GLUCOSE 122* 92 96 105* 100*  BUN 14 14 12 15  27*  CREATININE 0.78 0.77 0.65 0.96 1.04*  CALCIUM 9.2 9.6 9.4 9.3 9.0  MG  --  1.7 1.7 2.2 2.2  PHOS  --  3.8 3.9  --   --    GFR: Estimated Creatinine Clearance: 54.6 mL/min (A) (by C-G formula based on SCr of 1.04 mg/dL (H)). Liver Function Tests: Recent Labs  Lab 08/02/20 1213 08/03/20 0359  AST 15 16  ALT 13 15  ALKPHOS 71 71  BILITOT 0.6 0.6  PROT 6.8 7.5  ALBUMIN 3.7 4.0   No results for input(s): LIPASE, AMYLASE in the last 168 hours. No results for input(s): AMMONIA in the last 168 hours. Coagulation Profile: No results for input(s): INR, PROTIME in the last 168 hours. Cardiac Enzymes: Recent Labs  Lab 08/02/20 1935  CKTOTAL 55   BNP (last 3 results) No results for input(s): PROBNP in the last 8760 hours. HbA1C: No results for input(s): HGBA1C in the last 72 hours. CBG: No results for input(s): GLUCAP in the last 168 hours. Lipid Profile: Recent Labs     08/04/20 0359  CHOL 179  HDL 74  LDLCALC 92  TRIG 64  CHOLHDL 2.4   Thyroid Function Tests: Recent Labs    08/03/20 0359  TSH 1.648   Anemia Panel: No results for input(s): VITAMINB12, FOLATE, FERRITIN, TIBC, IRON, RETICCTPCT in the last 72 hours. Sepsis Labs: No results for input(s): PROCALCITON, LATICACIDVEN in the last 168 hours.  Recent Results (from the past 240 hour(s))  Resp Panel by RT-PCR (Flu A&B, Covid)     Status: None   Collection Time: 08/02/20  1:03 PM  Result Value Ref Range Status   SARS Coronavirus 2 by RT PCR NEGATIVE NEGATIVE Final    Comment: (NOTE) SARS-CoV-2 target nucleic acids are NOT DETECTED.  The SARS-CoV-2 RNA is generally detectable in upper respiratory specimens during the acute phase of infection. The lowest concentration of SARS-CoV-2 viral copies this assay can detect is 138 copies/mL. A negative result does  not preclude SARS-Cov-2 infection and should not be used as the sole basis for treatment or other patient management decisions. A negative result may occur with  improper specimen collection/handling, submission of specimen other than nasopharyngeal swab, presence of viral mutation(s) within the areas targeted by this assay, and inadequate number of viral copies(<138 copies/mL). A negative result must be combined with clinical observations, patient history, and epidemiological information. The expected result is Negative.  Fact Sheet for Patients:  EntrepreneurPulse.com.au  Fact Sheet for Healthcare Providers:  IncredibleEmployment.be  This test is no t yet approved or cleared by the [place] FDA and  has been authorized for detection and/or diagnosis of SARS-CoV-2 by FDA under an Emergency Use Authorization (EUA). This EUA will remain  in effect (meaning this test can be used) for the duration of the COVID-19 declaration under Section 564(b)(1) of the Act, 21 U.S.C.section 360bbb-3(b)(1),  unless the authorization is terminated  or revoked sooner.       Influenza A by PCR NEGATIVE NEGATIVE Final   Influenza B by PCR NEGATIVE NEGATIVE Final    Comment: (NOTE) The Xpert Xpress SARS-CoV-2/FLU/RSV plus assay is intended as an aid in the diagnosis of influenza from Nasopharyngeal swab specimens and should not be used as a sole basis for treatment. Nasal washings and aspirates are unacceptable for Xpert Xpress SARS-CoV-2/FLU/RSV testing.  Fact Sheet for Patients: EntrepreneurPulse.com.au  Fact Sheet for Healthcare Providers: IncredibleEmployment.be  This test is not yet approved or cleared by the [place] FDA and has been authorized for detection and/or diagnosis of SARS-CoV-2 by FDA under an Emergency Use Authorization (EUA). This EUA will remain in effect (meaning this test can be used) for the duration of the COVID-19 declaration under Section 564(b)(1) of the Act, 21 U.S.C. section 360bbb-3(b)(1), unless the authorization is terminated or revoked.  Performed at [facility], [address], [place], [place] [address]          Radiology Studies: DG ESOPHAGUS W SINGLE CM (SOL OR THIN BA)  Result Date: 08/04/2020 CLINICAL DATA:  Difficulty swallowing. Patient reports food getting stuck in throat. EXAM: ESOPHOGRAM / BARIUM SWALLOW / BARIUM TABLET STUDY TECHNIQUE: Combined double contrast and single contrast examination performed using effervescent crystals, thick barium liquid, and thin barium liquid. The patient was observed with fluoroscopy swallowing a 13 mm barium sulphate tablet. FLUOROSCOPY TIME:  Fluoroscopy Time:  2 minutes 36 seconds Radiation Exposure Index (if provided by the fluoroscopic device): 48.2 mGy Number of Acquired Spot Images: 0 COMPARISON:  None. FINDINGS: The esophagus appears patulous and increased in caliber. There is smoothly marginated beak like narrowing of the distal esophagus up  to the GE junction. No malignant appearing stricture or mass noted. The motility of the esophagus is diffusely abnormal. There is significant scratch set significantly diminished primary peristalsis with pooling of barium in the esophagus. Uncoordinated, non propulsive tertiary contractions are noted . A 13 mm barium tablet was ingested which remains static within the dilated portion of the mid to distal esophagus. Despite multiple attempts to clear the pill there was insufficient peristalsis to push the pill through the dilated and patulous portions of the esophagus. No hiatal hernia or signs of reflux identified. IMPRESSION: 1. 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 Electronically Signed   By: Kerby Moors M.D.   On: 08/04/2020 09:38        Scheduled Meds:  apixaban  5 mg Oral BID   atorvastatin  40 mg Oral Daily  ipratropium-albuterol  3 mL Inhalation BID   LORazepam  1 mg Oral BID   metoprolol succinate  50 mg Oral BID   mirtazapine  45 mg Oral QHS   pantoprazole (PROTONIX) IV  40 mg Intravenous Q12H   phenazopyridine  100 mg Oral TID WC   potassium chloride  40 mEq Oral BID   sertraline  100 mg Oral Daily   [START ON 08/06/2020] torsemide  40 mg Oral Daily   Continuous Infusions:   LOS: 3 days    Time spent: 32 minutes    Barb Merino, MD [facility] Pager [phone]

## 2020-08-05 NOTE — Progress Notes (Signed)
Progress Note  [name] Date of Encounter: 08/05/2020  [facility] Cardiologist: Pixie Casino, MD   Subjective   No chest pain, no SOB is feeling stable.   Inpatient Medications    Scheduled Meds:  atorvastatin  40 mg Oral Daily   furosemide  40 mg Intravenous Q12H   ipratropium-albuterol  3 mL Inhalation BID   LORazepam  1 mg Oral BID   metoprolol succinate  50 mg Oral BID   mirtazapine  45 mg Oral QHS   pantoprazole (PROTONIX) IV  40 mg Intravenous Q12H   phenazopyridine  100 mg Oral TID WC   potassium chloride  40 mEq Oral BID   sertraline  100 mg Oral Daily   Continuous Infusions:  sodium chloride     PRN Meds: acetaminophen, albuterol   Vital Signs    Vitals:   08/04/20 2051 08/05/20 0546 08/05/20 0844 08/05/20 0929  BP: 113/74 128/77  110/75  Pulse: 68 (!) 54  89  Resp: 16 16    Temp: 97.7 F (36.5 C) 98 F (36.7 C)    TempSrc: Oral Oral    SpO2: 93% 95% 98%   Weight:      Height:        Intake/Output Summary (Last 24 hours) at 08/05/2020 1021 Last data filed at 08/04/2020 2055 Gross per 24 hour  Intake 720 ml  Output 1675 ml  Net -955 ml   Last 3 Weights 08/04/2020 08/03/2020 08/02/2020  Weight (lbs) 238 lb 1.6 oz 238 lb 15.7 oz 250 lb  Weight (kg) 108 kg 108.4 kg 113.399 kg      Telemetry    Atrial fib  rate controlled - Personally Reviewed  ECG    No new - Personally Reviewed  Physical Exam   GEN: No acute distress.   Neck: No JVD Cardiac: irreg irreg, no murmurs, rubs, or gallops.  Respiratory: few rales in bases rare wheeze to auscultation bilaterally. GI: Soft, nontender, non-distended  MS: tr edema; No deformity. Neuro:  Nonfocal  Psych: Normal affect   Labs    High Sensitivity Troponin:   Recent Labs  Lab 08/02/20 1213 08/02/20 1448  TROPONINIHS 7 11      Chemistry Recent Labs  Lab 08/02/20 1213 08/02/20 1935 08/03/20 0359 08/04/20 0359 08/05/20 0336  NA 131*   < > 133* 135 135  K 3.2*    < > 3.1* 3.6 3.0*  CL 86*   < > 88* 87* 87*  CO2 35*   < > 34* 37* 37*  GLUCOSE 122*   < > 96 105* 100*  BUN 14   < > 12 15 27*  CREATININE 0.78   < > 0.65 0.96 1.04*  CALCIUM 9.2   < > 9.4 9.3 9.0  PROT 6.8  --  7.5  --   --   ALBUMIN 3.7  --  4.0  --   --   AST 15  --  16  --   --   ALT 13  --  15  --   --   ALKPHOS 71  --  71  --   --   BILITOT 0.6  --  0.6  --   --   GFRNONAA >60   < > >60 60* 54*  ANIONGAP 10   < > 11 11 11    < > = values in this interval not displayed.     Hematology Recent Labs  Lab 08/02/20 1213 08/03/20 0359  08/04/20 0359  WBC 8.8 8.3 7.5  RBC 4.16 4.41 4.63  HGB 13.0 13.7 14.4  HCT 37.9 41.5 43.8  MCV 91.1 94.1 94.6  MCH 31.3 31.1 31.1  MCHC 34.3 33.0 32.9  RDW 13.5 13.6 14.1  PLT 309 287 296    BNP Recent Labs  Lab 08/02/20 1213  BNP 305.4*     DDimer No results for input(s): DDIMER in the last 168 hours.   Radiology    DG ESOPHAGUS W SINGLE CM (SOL OR THIN BA)  Result Date: 08/04/2020 CLINICAL DATA:  Difficulty swallowing. Patient reports food getting stuck in throat. EXAM: ESOPHOGRAM / BARIUM SWALLOW / BARIUM TABLET STUDY TECHNIQUE: Combined double contrast and single contrast examination performed using effervescent crystals, thick barium liquid, and thin barium liquid. The patient was observed with fluoroscopy swallowing a 13 mm barium sulphate tablet. FLUOROSCOPY TIME:  Fluoroscopy Time:  2 minutes 36 seconds Radiation Exposure Index (if provided by the fluoroscopic device): 48.2 mGy Number of Acquired Spot Images: 0 COMPARISON:  None. FINDINGS: The esophagus appears patulous and increased in caliber. There is smoothly marginated beak like narrowing of the distal esophagus up to the GE junction. No malignant appearing stricture or mass noted. The motility of the esophagus is diffusely abnormal. There is significant scratch set significantly diminished primary peristalsis with pooling of barium in the esophagus. Uncoordinated, non  propulsive tertiary contractions are noted . A 13 mm barium tablet was ingested which remains static within the dilated portion of the mid to distal esophagus. Despite multiple attempts to clear the pill there was insufficient peristalsis to push the pill through the dilated and patulous portions of the esophagus. No hiatal hernia or signs of reflux identified. IMPRESSION: 1. 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 Electronically Signed   By: Kerby Moors M.D.   On: 08/04/2020 09:38    Cardiac Studies     2D Echo 08/03/20  1. Left ventricular ejection fraction, by estimation, is 60 to 65%. The  left ventricle has normal function. The left ventricle has no regional  wall motion abnormalities. Left ventricular diastolic function could not  be evaluated.   2. Right ventricular systolic function is normal. The right ventricular  size is mildly enlarged.   3. The mitral valve is normal in structure. Trivial mitral valve  regurgitation. No evidence of mitral stenosis.   4. The aortic valve is tricuspid. Aortic valve regurgitation is not  visualized. Mild aortic valve sclerosis is present, with no evidence of  aortic valve stenosis.   5. The inferior vena cava is normal in size with greater than 50%  respiratory variability, suggesting right atrial pressure of 3 mmHg.    Patient Profile     80 y.o. female with chronic hypoxic respiratory failure on home O2 (typically QHS), kyphosis and levoscoliosis leading to restrictive defect, paroxysmal atrial fibrillation, anxiety, suspected chronic diastolic CHF, asthma, HTN, HLD, remote tobacco use (quit 1970s) [facility] with increasing SOB/DOE, dysuria and hypoxia.    Assessment & Plan    Acute on chronic hypoxic resp. Failure with suspected acute on chronic diastolic CHF --- baseline pulmonary reserve is diminished due to h/o chronic respiratory failure, asthma, and restrictive defect from postural  kyphosis/scoliosis - 2D Echo 08/03/20 EF 60-65%, cannot eval diastolic function, RV mildly enlarged, mild aortic sclerosis without stenosis - continue to hold metolazone given plenty of room to titrate diuretic dose if needed - unclear if weights accurate - stated weight 250 on  arrival, remains at 69 today with modest UOP -1.558 ml since admit no wt today.  She is on lasix 40 BID.   Atrial Fib possible PAF.  - HR controlled less than 100 -on eliquis and outpt DCCV in 3 weeks. As long as no missed doses.    Exertional chest discomfort  --neg troponin, has  coronary calcification of CTA of chest.  No prior dx of CAD.    HLD on statin with increased dose  Hypokalemia, hyponatremia may be metolazone monitor.   Obesity, possible sleep apnea consider outpt sleep study   abnormal esophagram study suggesting dysmotility and possibly achalasia - may need GI eval, suspect aspiration. per IM      For questions or updates, please contact [place] Please consult [email or web address] for contact info under        Signed, Cecilie Kicks, NP  08/05/2020, 10:21 AM

## 2020-08-05 NOTE — Progress Notes (Signed)
Speech Language Pathology Discharge Patient Details Name: Lindsay [name] MRN: [id number] DOB: Sep 24, 1940 Today's Date: 08/05/2020 Time:  -     Patient discharged from SLP services secondary to  esophageal deficits noted and GI managed.  Please reconsult if desire.  Please see latest therapy progress note for current level of functioning and progress toward goals.      GO    Kathleen Lime, MS [facility] SLP Acute Rehab Services Office [phone] Pager [phone]  Macario Golds 08/05/2020, 7:24 PM

## 2020-08-05 NOTE — Progress Notes (Signed)
PT Cancellation Note  Patient Details Name: Lindsay [name] MRN: [id number] DOB: 01-12-1941   Cancelled Treatment:    Reason Eval/Treat Not Completed: Patient at procedure or test/unavailable   Hong Moring,KATHrine E 08/05/2020, 1:21 PM Arlyce Dice, DPT Acute Rehabilitation Services Pager: [phone] Office: [phone]

## 2020-08-05 NOTE — Brief Op Note (Signed)
08/02/2020 - 08/05/2020  1:51 PM  PATIENT:  Lindsay [name]  80 y.o. female  PRE-OPERATIVE DIAGNOSIS:  dysphagia  POST-OPERATIVE DIAGNOSIS:  abrnomal motility, gastritis  PROCEDURE:  Procedure(s): ESOPHAGOGASTRODUODENOSCOPY (EGD) (N/A)  SURGEON:  Surgeon(s) and Role:    * Baraka Klatt, MD - Primary  Findings ----------- -EGD showed possible motility disorder with minimal amount of retained fluid in esophagus which was suctioned.  No stricture.  Mild gastritis  Recommendations ------------------------- -Soft diet and advance as tolerated -Recommend to chew food well and moist food before swallowing. -Okay to resume anticoagulation from GI standpoint -Findings discussed with patient's daughter over the phone. -GI will sign off.  Call us back if needed  Otis Brace MD, [place] 08/05/2020, 1:54 PM  Contact #  [phone]

## 2020-08-05 NOTE — Progress Notes (Signed)
Physical Therapy Treatment Patient Details Name: Lindsay [name] MRN: [id number] DOB: October 09, 1940 Today's Date: 08/05/2020    History of Present Illness Patient is an 80 year old female who presented to the hospital on 7/11 with  worsening SOB and hypoxia fatigue and weakness. Patient was admitted Acute diastolic congestive heart failure. RJJ:[id number] respiratory failure, asthma, A. fib , HLD osteoporosis    PT Comments    Pt assisted with ambulating in hallway and unfortunately poor continuous pulse oximeter monitoring.  SpO2 reading in 90s when picking up however pt reports moderate dyspnea end of ambulation and required transfer to [facility] instead of ambulating to bathroom (left with OT for this task).  Pt's daughter present and feels pt can return to her apartment and very agreeable for HHPT services upon d/c.    Follow Up Recommendations  Home health PT;Supervision - Intermittent     Equipment Recommendations  None recommended by PT    Recommendations for Other Services       Precautions / Restrictions Precautions Precautions: Fall Precaution Comments: monitor sats O2 and HR    Mobility  Bed Mobility Overal bed mobility: Needs Assistance Bed Mobility: Supine to Sit     Supine to sit: Min guard     General bed mobility comments: provided a hand for pt to self assist trunk upright    Transfers Overall transfer level: Needs assistance Equipment used: Rolling walker (2 wheeled) Transfers: Sit to/from Stand Sit to Stand: Min assist         General transfer comment: verbal cues for hand placement, assist to rise and control descent  Ambulation/Gait Ambulation/Gait assistance: Min assist;Min guard Gait Distance (Feet): 50 Feet (x2) Assistive device: Rolling walker (2 wheeled) Gait Pattern/deviations: Step-through pattern;Decreased stride length;Trunk flexed     General Gait Details: verbal cues for RW positioning, posture, required brief standing rest break; ambulated on  room air and continuous pulse ox reading poorly however 90% upon return to room,  reapplied 2L O2 [place] due to pt's fatigue and SOB as OT to assist pt to recliner and RN entered room and notified   Stairs             Wheelchair Mobility    Modified Rankin (Stroke Patients Only)       Balance     Sitting balance-Leahy Scale: Good     Standing balance support: During functional activity Standing balance-Leahy Scale: Good                              Cognition Arousal/Alertness: Awake/alert Behavior During Therapy: WFL for tasks assessed/performed;Anxious Overall Cognitive Status: Within Functional Limits for tasks assessed                                        Exercises      General Comments        Pertinent Vitals/Pain Pain Assessment: No/denies pain    Home Living                      Prior Function            PT Goals (current goals can now be found in the care plan section) Acute Rehab PT Goals Patient Stated Goal: to get better at breathing and walking Progress towards PT goals: Progressing toward goals    Frequency  Min 3X/week      PT Plan Current plan remains appropriate    Co-evaluation              AM-PAC PT "6 Clicks" Mobility   Outcome Measure  Help needed turning from your back to your side while in a flat bed without using bedrails?: A Little Help needed moving from lying on your back to sitting on the side of a flat bed without using bedrails?: A Little Help needed moving to and from a bed to a chair (including a wheelchair)?: A Little Help needed standing up from a chair using your arms (e.g., wheelchair or bedside chair)?: A Little Help needed to walk in hospital room?: A Little Help needed climbing 3-5 steps with a railing? : A Lot 6 Click Score: 17    End of Session Equipment Utilized During Treatment: Oxygen Activity Tolerance: Patient tolerated treatment well Patient left:  with call bell/phone within reach;in chair (left with OT for [facility] transfer) Nurse Communication: Mobility status PT Visit Diagnosis: Difficulty in walking, not elsewhere classified (R26.2)     Time: 3545-6256 PT Time Calculation (min) (ACUTE ONLY): 13 min  Charges:  $Gait Training: 8-22 mins                    Arlyce Dice, DPT Acute Rehabilitation Services Pager: [phone] Office: [phone]  York Ram E 08/05/2020, 4:36 PM

## 2020-08-05 NOTE — Plan of Care (Signed)

## 2020-08-06 ENCOUNTER — Encounter: Payer: Self-pay | Admitting: Physician Assistant

## 2020-08-06 DIAGNOSIS — K220 Achalasia of cardia: Secondary | ICD-10-CM | POA: Diagnosis present

## 2020-08-06 LAB — BASIC METABOLIC PANEL
Anion gap: 9 (ref 5–15)
BUN: 25 mg/dL — ABNORMAL HIGH (ref 8–23)
CO2: 34 mmol/L — ABNORMAL HIGH (ref 22–32)
Calcium: 8.7 mg/dL — ABNORMAL LOW (ref 8.9–10.3)
Chloride: 91 mmol/L — ABNORMAL LOW (ref 98–111)
Creatinine, Ser: 0.96 mg/dL (ref 0.44–1.00)
GFR, Estimated: 60 mL/min — ABNORMAL LOW (ref >60)
Glucose, Bld: 111 mg/dL — ABNORMAL HIGH (ref 70–99)
Potassium: 4 mmol/L (ref 3.5–5.1)
Sodium: 134 mmol/L — ABNORMAL LOW (ref 135–145)

## 2020-08-06 MED ORDER — TORSEMIDE 40 MG PO TABS: 40.0000 mg | ORAL_TABLET | Freq: Every day | ORAL | 0 refills | Status: DC

## 2020-08-06 MED ORDER — PANTOPRAZOLE SODIUM 40 MG PO TBEC: 40.0000 mg | DELAYED_RELEASE_TABLET | Freq: Every day | ORAL | 3 refills | Status: DC

## 2020-08-06 MED ORDER — POTASSIUM CHLORIDE CRYS ER 20 MEQ PO TBCR: 20.0000 meq | EXTENDED_RELEASE_TABLET | Freq: Every day | ORAL | 0 refills | Status: DC

## 2020-08-06 NOTE — TOC Transition Note (Signed)
Transition of Care [facility]) - CM/SW Discharge Note   Patient Details  Name: Lindsay [name] MRN: [id number] Date of Birth: 1940/04/24  Transition of Care [facility]) CM/SW Contact:  Ross Ludwig, LCSW Phone Number: 08/06/2020, 10:58 AM   Clinical Narrative:     Patient will be going home with home health through [facility].  CSW signing off please reconsult with any other social work needs, home health agency has been notified of planned discharge.   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

## 2020-08-06 NOTE — Discharge Summary (Signed)
Physician Discharge Summary  Lindsay [name] FXT:[id number] DOB: [birth year] DOA: 08/02/2020  PCP: Bernerd Limbo, MD  Admit date: 08/02/2020 Discharge date: 08/06/2020  Admitted From: Home Disposition: Home with home health  Recommendations for Outpatient Follow-up:  Follow up with PCP in 1-2 weeks Please obtain BMP/CBC in one week Cardiology will schedule follow-up.  Home Health: PT/OT/RN/social worker Equipment/Devices: Oxygen available at home  Discharge Condition: Stable CODE STATUS: Full code Diet recommendation: Low-salt diet, dietary modification including reflux prevention and achalasia consistent diet.  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 fib and therapeutic on Eliquis, anxiety, hypertension, hyperlipidemia presented to the emergency room for evaluation of shortness of breath that is progressive, hypoxia and difficulty eating food. In the emergency room on attempted ambulation, patient was 79% on 2 L oxygen.  Clinical evidence of congestive heart failure so admitted to the hospital with cardiology consultation. Patient was found to have diastolic dysfunction, achalasia and dysphagia.  # Acute diastolic congestive heart failure: Echocardiogram with normal ejection fraction. Probable diastolic dysfunction with long history of hypertension and A. fib. Clinical evidence of fluid overload, treated with IV diuresis with good response. aggressive potassium replacement.  Intake and output monitoring.  Followed by cardiology. Changed to oral torsemide and currently euvolemic. Discharging home with outpatient cardiology follow-up.  # Acute on chronic hypoxemic respiratory failure: Multifactorial.  Due to acute diastolic dysfunction, restrictive lung disease and combination of factors.  Keep on oxygen to keep saturation more  than 90%. Patient uses 2 L oxygen at home. Patient is at usual oxygen requirement today.  # Dysphagia: Seen by speech therapy.  No evidence of oropharyngeal dysphagia. Barium esophagogram with patulous esophagus, tapering off of the lower esophageal.  This is consistent with achalasia. Underwent upper GI endoscopy with no strictures but consistent with achalasia. Advised soft diet, Diet and lifestyle modification.  Instructions given.  # Paroxysmal A. fib: Rate controlled.  Therapeutic on Eliquis.  On metoprolol.   As per cardiology plan, possible cardioversion in 3 weeks.  They will schedule follow-up.  #Physical deconditioning and debility: She will benefit with ongoing therapies.  Will prescribe physical and occupational therapy at home.  She will need more support at home and will benefit with nursing care.  Chronically sick currently stabilized.  Is stable to discharge home with therapies at home.  Discharge Diagnoses:  Principal Problem:   Acute diastolic CHF (congestive heart failure) (HCC) Active Problems:   Acute on chronic respiratory failure with hypoxia (HCC)   Hyponatremia   Hypokalemia   Dysphagia   Asthma   AF (paroxysmal atrial fibrillation) (HCC)   Chronic anticoagulation   Acquired thrombophilia (HCC)   Achalasia of esophagus    Discharge Instructions  Discharge Instructions     Diet - low sodium heart healthy   Complete by: As directed    Increase activity slowly   Complete by: As directed       Allergies as of 08/06/2020       Reactions   Acetaminophen-codeine    Other reaction(s): Agitation        Medication List     STOP taking these medications    metolazone 5 MG tablet Commonly known as: ZAROXOLYN       TAKE these medications    albuterol 108 (90 Base) MCG/ACT inhaler Commonly known as: VENTOLIN HFA Inhale 1-2 puffs into the lungs every 6 (  six) hours as needed for wheezing or shortness of breath.   alendronate 70 MG  tablet Commonly known as: FOSAMAX Take 70 mg by mouth once a week. Saturdays   apixaban 5 MG Tabs tablet Commonly known as: ELIQUIS Take 5 mg by mouth 2 (two) times daily.   atorvastatin 20 MG tablet Commonly known as: LIPITOR Take 20 mg by mouth daily.   benzonatate 200 MG capsule Commonly known as: TESSALON Take 200 mg by mouth daily as needed for cough.   CALCIUM 1200+D3 PO Take 1 tablet by mouth daily.   Dextromethorphan-Guaifenesin 60-1200 MG 12hr tablet Take 2 tablets by mouth every 12 (twelve) hours.   ipratropium-albuterol 0.5-2.5 (3) MG/3ML Soln Commonly known as: DUONEB Inhale 3 mLs into the lungs every 6 (six) hours as needed (wheezing).   LORazepam 1 MG tablet Commonly known as: ATIVAN Take 1 mg by mouth 2 (two) times daily.   metoprolol succinate 50 MG 24 hr tablet Commonly known as: TOPROL-XL Take 50 mg by mouth 2 (two) times daily.   mirtazapine 45 MG tablet Commonly known as: REMERON Take 45 mg by mouth at bedtime.   multivitamin with minerals Tabs tablet Take 1 tablet by mouth daily.   OXYGEN Inhale 2 L into the lungs continuous.   pantoprazole 40 MG tablet Commonly known as: Protonix Take 1 tablet (40 mg total) by mouth daily.   potassium chloride SA 20 MEQ tablet Commonly known as: KLOR-CON Take 1 tablet (20 mEq total) by mouth daily.   sertraline 100 MG tablet Commonly known as: ZOLOFT Take 100 mg by mouth daily.   Torsemide 40 MG Tabs Take 40 mg by mouth daily. What changed:  medication strength how much to take        Follow-up Information     Bernerd Limbo, MD Follow up in 2 week(s).   Specialty: Family Medicine Contact information: [address] [place] [address] [phone]         Pixie Casino, MD Follow up.   Specialty: Cardiology Why: office will schedule follow up. Contact information: [address] [place] [address] [phone]                 Allergies  Allergen Reactions   Acetaminophen-Codeine     Other reaction(s): Agitation    Consultations: Cardiology   Procedures/Studies: CT Angio Chest Pulmonary Embolism (PE) W or WO Contrast  Result Date: 08/02/2020 CLINICAL DATA:  Shortness of breath, chronic respiratory failure history of asthma and atrial fibrillation EXAM: CT ANGIOGRAPHY CHEST WITH CONTRAST TECHNIQUE: Multidetector CT imaging of the chest was performed using the standard protocol during bolus administration of intravenous contrast. Multiplanar CT image reconstructions and MIPs were obtained to evaluate the vascular anatomy. CONTRAST:  14mL OMNIPAQUE IOHEXOL 350 MG/ML SOLN COMPARISON:  Radiograph 08/02/2020 FINDINGS: Cardiovascular: Exam quality is limited by extensive respiratory motion artifact throughout both lungs which may limit detection of smaller segmental and subsegmental pulmonary artery emboli. Otherwise, there is satisfactory opacification of the pulmonary arteries without large central or lobar filling defects identified. Central pulmonary arteries are normal caliber. Mild cardiomegaly with biatrial enlargement. Coronary artery calcifications. No pericardial effusion. Suboptimal opacification of the thoracic aorta for complete luminal assessment. Atherosclerotic plaque within the normal caliber aorta. Tortuosity of the otherwise normally branching great vessels. Tortuosity of the distal thoracic aorta noted as well. There is notable venous reflux into the hepatic veins and IVC. Mediastinum/Nodes: No mediastinal fluid or gas. No concerning dominant thyroid  nodule. No acute abnormality of the trachea or esophagus. No worrisome mediastinal, hilar or axillary adenopathy. Lungs/Pleura: Bilateral pleural effusions, right greater than left. Adjacent areas of likely passive atelectatic change. No other focal consolidative process is evident. There is however some notable pulmonary vascular redistribution with interlobular  septal and fissural thickening which suggests some degree of superimposed pulmonary edema. No concerning pulmonary nodules or masses though evaluation is significantly limited by motion artifact. Upper Abdomen: Calcifications the spleen may reflect sequela of prior granulomatous disease. No acute abnormalities present in the visualized portions of the upper abdomen. Musculoskeletal: Multilevel compression deformities of the thoracic spine with exaggerated thoracic kyphosis appearing largely remote in nature. These include: Pincer type deformity with 90% height loss T7 Pincer type deformity with 75% height loss T8 Concave superior endplate deformity with 20% height loss T9 Pincer deformity at T10 with near complete height loss centrally. Pincer deformity T11 with a 75% height loss. L1 superior endplate deformity, degree of height loss incompletely assessed. No other acute or suspicious osseous injuries. Background of degenerative changes in the spine and shoulders. No worrisome chest wall mass or lesion. Posterior body wall edema. Review of the MIP images confirms the above findings. IMPRESSION: 1. Exam quality limited by extensive motion artifact. 2. No large central or lobar filling defects. 3. Cardiomegaly with biatrial enlargement. Reflux of contrast into the hepatic veins and IVC may reflect a degree of right heart failure or elevated right heart pressures. 4. Features suggesting developing interstitial edema with bilateral effusions, correlate with clinical features of CHF/volume overload. 5. Additional atelectatic changes in the lung bases. Underlying airspace disease or aspiration difficult to fully exclude. 6. Coronary artery atherosclerosis. Aortic Atherosclerosis (ICD10-I70.0). 7. Multilevel, remote appearing compression deformities throughout the thoracic spine and L1 level, as described above. Electronically Signed   By: Lovena Le M.D.   On: 08/02/2020 21:53   DG Chest Port 1 View  Result Date:  08/02/2020 CLINICAL DATA:  Chest pain, shortness of breath EXAM: PORTABLE CHEST 1 VIEW COMPARISON:  None. FINDINGS: Cardiomegaly. Small bilateral pleural effusions with bibasilar atelectasis or infiltrates. No acute bony abnormality. IMPRESSION: Small bilateral pleural effusions with bibasilar atelectasis or infiltrates. Electronically Signed   By: Rolm Baptise M.D.   On: 08/02/2020 12:27   ECHOCARDIOGRAM COMPLETE  Result Date: 08/03/2020    ECHOCARDIOGRAM REPORT   Patient Name:   Lindsay [name] Date of Exam: 08/03/2020 Medical Rec #:  [id number]  Height:       67.0 in Accession #:    [id number] Weight:       239.0 lb Date of Birth:  05/08/1940  BSA:          2.181 m Patient Age:    4 years   BP:           103/77 mmHg Patient Gender: F          HR:           66 bpm. Exam Location:  Inpatient Procedure: 2D Echo, Cardiac Doppler and Color Doppler Indications:    CHF  History:        Patient has no prior history of Echocardiogram examinations.                 Cardiomegaly, Arrythmias:Atrial Fibrillation,                 Signs/Symptoms:Shortness of Breath and Resp. failure; Risk  Factors:Hypertension, Dyslipidemia and Obesity.  Sonographer:    Dustin Flock Referring Phys: [phone] ANASTASSIA DOUTOVA  Sonographer Comments: Image acquisition challenging due to COPD and Image acquisition challenging due to patient body habitus. IMPRESSIONS  1. Left ventricular ejection fraction, by estimation, is 60 to 65%. The left ventricle has normal function. The left ventricle has no regional wall motion abnormalities. Left ventricular diastolic function could not be evaluated.  2. Right ventricular systolic function is normal. The right ventricular size is mildly enlarged.  3. The mitral valve is normal in structure. Trivial mitral valve regurgitation. No evidence of mitral stenosis.  4. The aortic valve is tricuspid. Aortic valve regurgitation is not visualized. Mild aortic valve sclerosis is present, with no evidence  of aortic valve stenosis.  5. The inferior vena cava is normal in size with greater than 50% respiratory variability, suggesting right atrial pressure of 3 mmHg. FINDINGS  Left Ventricle: Left ventricular ejection fraction, by estimation, is 60 to 65%. The left ventricle has normal function. The left ventricle has no regional wall motion abnormalities. The left ventricular internal cavity size was normal in size. There is  no left ventricular hypertrophy. Left ventricular diastolic function could not be evaluated due to atrial fibrillation. Left ventricular diastolic function could not be evaluated. Normal left ventricular filling pressure. Right Ventricle: The right ventricular size is mildly enlarged. No increase in right ventricular wall thickness. Right ventricular systolic function is normal. Left Atrium: Left atrial size was normal in size. Right Atrium: Right atrial size was normal in size. Pericardium: There is no evidence of pericardial effusion. Mitral Valve: The mitral valve is normal in structure. Trivial mitral valve regurgitation. No evidence of mitral valve stenosis. Tricuspid Valve: The tricuspid valve is normal in structure. Tricuspid valve regurgitation is mild . No evidence of tricuspid stenosis. Aortic Valve: The aortic valve is tricuspid. Aortic valve regurgitation is not visualized. Mild aortic valve sclerosis is present, with no evidence of aortic valve stenosis. Pulmonic Valve: The pulmonic valve was normal in structure. Pulmonic valve regurgitation is trivial. No evidence of pulmonic stenosis. Aorta: The aortic root is normal in size and structure. Venous: The inferior vena cava was not well visualized. The inferior vena cava is normal in size with greater than 50% respiratory variability, suggesting right atrial pressure of 3 mmHg. IAS/Shunts: No atrial level shunt detected by color flow Doppler.  LEFT VENTRICLE PLAX 2D LVIDd:         5.30 cm     Diastology LVIDs:         2.80 cm     LV e'  medial:    8.59 cm/s LV PW:         1.30 cm     LV E/e' medial:  13.2 LV IVS:        1.10 cm     LV e' lateral:   9.68 cm/s LVOT diam:     2.00 cm     LV E/e' lateral: 11.7 LV SV:         52 LV SV Index:   24 LVOT Area:     3.14 cm  LV Volumes (MOD) LV vol d, MOD A4C: 66.3 ml LV vol s, MOD A4C: 25.0 ml LV SV MOD A4C:     66.3 ml RIGHT VENTRICLE RV Basal diam:  3.70 cm RV S prime:     14.10 cm/s TAPSE (M-mode): 1.9 cm LEFT ATRIUM             Index  RIGHT ATRIUM           Index LA diam:        4.60 cm 2.11 cm/m  RA Area:     21.00 cm LA Vol (A2C):   53.7 ml 24.62 ml/m RA Volume:   60.70 ml  27.83 ml/m LA Vol (A4C):   79.4 ml 36.40 ml/m LA Biplane Vol: 71.2 ml 32.64 ml/m  AORTIC VALVE LVOT Vmax:   75.00 cm/s LVOT Vmean:  54.100 cm/s LVOT VTI:    0.164 m  AORTA Ao Root diam: 3.30 cm MITRAL VALVE                TRICUSPID VALVE MV Area (PHT): 4.44 cm     TR Peak grad:   34.3 mmHg MV Decel Time: 171 msec     TR Vmax:        293.00 cm/s MV E velocity: 113.00 cm/s                             SHUNTS                             Systemic VTI:  0.16 m                             Systemic Diam: 2.00 cm Fransico Him MD Electronically signed by Fransico Him MD Signature Date/Time: 08/03/2020/9:38:21 AM    Final    DG ESOPHAGUS W SINGLE CM (SOL OR THIN BA)  Result Date: 08/04/2020 CLINICAL DATA:  Difficulty swallowing. Patient reports food getting stuck in throat. EXAM: ESOPHOGRAM / BARIUM SWALLOW / BARIUM TABLET STUDY TECHNIQUE: Combined double contrast and single contrast examination performed using effervescent crystals, thick barium liquid, and thin barium liquid. The patient was observed with fluoroscopy swallowing a 13 mm barium sulphate tablet. FLUOROSCOPY TIME:  Fluoroscopy Time:  2 minutes 36 seconds Radiation Exposure Index (if provided by the fluoroscopic device): 48.2 mGy Number of Acquired Spot Images: 0 COMPARISON:  None. FINDINGS: The esophagus appears patulous and increased in caliber. There is  smoothly marginated beak like narrowing of the distal esophagus up to the GE junction. No malignant appearing stricture or mass noted. The motility of the esophagus is diffusely abnormal. There is significant scratch set significantly diminished primary peristalsis with pooling of barium in the esophagus. Uncoordinated, non propulsive tertiary contractions are noted . A 13 mm barium tablet was ingested which remains static within the dilated portion of the mid to distal esophagus. Despite multiple attempts to clear the pill there was insufficient peristalsis to push the pill through the dilated and patulous portions of the esophagus. No hiatal hernia or signs of reflux identified. IMPRESSION: 1. 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 Electronically Signed   By: Kerby Moors M.D.   On: 08/04/2020 09:38   (Echo, Carotid, EGD, Colonoscopy, ERCP)    Subjective: Patient seen and examined.  No overnight events.  Denies any chest pain or shortness of breath.   Discharge Exam: Vitals:   08/05/20 2242 08/06/20 0612  BP: 100/73 104/80  Pulse: 75 72  Resp: 20 20  Temp: 98.3 F (36.8 C) 98 F (36.7 C)  SpO2: 96% 92%   Vitals:   08/05/20 1426 08/05/20 1907 08/05/20 2242 08/06/20 0612  BP: 108/81  100/73 104/80  Pulse: 86  75 72  Resp: 20  20 20   Temp: 98.3 F (36.8 C)  98.3 F (36.8 C) 98 F (36.7 C)  TempSrc: Oral  Oral Oral  SpO2: 96% 95% 96% 92%  Weight:      Height:        General: Pt is alert, awake, not in acute distress Chronically sick looking and debilitated but not in any distress.  Looks fairly comfortable on 2 L oxygen. Cardiovascular: Irregularly irregular., S1/S2 +, no rubs, no gallops.  Trace bilateral pedal edema. Respiratory: Some conducted airway sounds otherwise mostly bilateral clear. Abdominal: Soft, NT, ND, bowel sounds + Extremities: Trace bilateral pedal edema.    The results of significant diagnostics from this  hospitalization (including imaging, microbiology, ancillary and laboratory) are listed below for reference.     Microbiology: Recent Results (from the past 240 hour(s))  Resp Panel by RT-PCR (Flu A&B, Covid)     Status: None   Collection Time: 08/02/20  1:03 PM  Result Value Ref Range Status   SARS Coronavirus 2 by RT PCR NEGATIVE NEGATIVE Final    Comment: (NOTE) SARS-CoV-2 target nucleic acids are NOT DETECTED.  The SARS-CoV-2 RNA is generally detectable in upper respiratory specimens during the acute phase of infection. The lowest concentration of SARS-CoV-2 viral copies this assay can detect is 138 copies/mL. A negative result does not preclude SARS-Cov-2 infection and should not be used as the sole basis for treatment or other patient management decisions. A negative result may occur with  improper specimen collection/handling, submission of specimen other than nasopharyngeal swab, presence of viral mutation(s) within the areas targeted by this assay, and inadequate number of viral copies(<138 copies/mL). A negative result must be combined with clinical observations, patient history, and epidemiological information. The expected result is Negative.  Fact Sheet for Patients:  EntrepreneurPulse.com.au  Fact Sheet for Healthcare Providers:  IncredibleEmployment.be  This test is no t yet approved or cleared by the [place] FDA and  has been authorized for detection and/or diagnosis of SARS-CoV-2 by FDA under an Emergency Use Authorization (EUA). This EUA will remain  in effect (meaning this test can be used) for the duration of the COVID-19 declaration under Section 564(b)(1) of the Act, 21 U.S.C.section 360bbb-3(b)(1), unless the authorization is terminated  or revoked sooner.       Influenza A by PCR NEGATIVE NEGATIVE Final   Influenza B by PCR NEGATIVE NEGATIVE Final    Comment: (NOTE) The Xpert Xpress SARS-CoV-2/FLU/RSV plus  assay is intended as an aid in the diagnosis of influenza from Nasopharyngeal swab specimens and should not be used as a sole basis for treatment. Nasal washings and aspirates are unacceptable for Xpert Xpress SARS-CoV-2/FLU/RSV testing.  Fact Sheet for Patients: EntrepreneurPulse.com.au  Fact Sheet for Healthcare Providers: IncredibleEmployment.be  This test is not yet approved or cleared by the [place] FDA and has been authorized for detection and/or diagnosis of SARS-CoV-2 by FDA under an Emergency Use Authorization (EUA). This EUA will remain in effect (meaning this test can be used) for the duration of the COVID-19 declaration under Section 564(b)(1) of the Act, 21 U.S.C. section 360bbb-3(b)(1), unless the authorization is terminated or revoked.  Performed at [facility], [address], [place], [place] [address]      Labs: BNP (last 3 results) Recent Labs    08/02/20 1213  BNP 696.2*   Basic Metabolic Panel: Recent Labs  Lab 08/02/20 1935 08/03/20 0359 08/04/20 0359 08/05/20 0336 08/06/20 0344  NA 134* 133* 135 135  134*  K 3.2* 3.1* 3.6 3.0* 4.0  CL 83* 88* 87* 87* 91*  CO2 37* 34* 37* 37* 34*  GLUCOSE 92 96 105* 100* 111*  BUN 14 12 15  27* 25*  CREATININE 0.77 0.65 0.96 1.04* 0.96  CALCIUM 9.6 9.4 9.3 9.0 8.7*  MG 1.7 1.7 2.2 2.2  --   PHOS 3.8 3.9  --   --   --    Liver Function Tests: Recent Labs  Lab 08/02/20 1213 08/03/20 0359  AST 15 16  ALT 13 15  ALKPHOS 71 71  BILITOT 0.6 0.6  PROT 6.8 7.5  ALBUMIN 3.7 4.0   No results for input(s): LIPASE, AMYLASE in the last 168 hours. No results for input(s): AMMONIA in the last 168 hours. CBC: Recent Labs  Lab 08/02/20 1213 08/03/20 0359 08/04/20 0359  WBC 8.8 8.3 7.5  NEUTROABS  --  6.4  --   HGB 13.0 13.7 14.4  HCT 37.9 41.5 43.8  MCV 91.1 94.1 94.6  PLT 309 287 296   Cardiac Enzymes: Recent Labs  Lab 08/02/20 1935   CKTOTAL 55   BNP: Invalid input(s): POCBNP CBG: No results for input(s): GLUCAP in the last 168 hours. D-Dimer No results for input(s): DDIMER in the last 72 hours. Hgb A1c No results for input(s): HGBA1C in the last 72 hours. Lipid Profile Recent Labs    08/04/20 0359  CHOL 179  HDL 74  LDLCALC 92  TRIG 64  CHOLHDL 2.4   Thyroid function studies No results for input(s): TSH, T4TOTAL, T3FREE, THYROIDAB in the last 72 hours.  Invalid input(s): FREET3 Anemia work up No results for input(s): VITAMINB12, FOLATE, FERRITIN, TIBC, IRON, RETICCTPCT in the last 72 hours. Urinalysis    Component Value Date/Time   COLORURINE STRAW (A) 08/03/2020 1422   APPEARANCEUR CLEAR 08/03/2020 1422   LABSPEC 1.010 08/03/2020 1422   PHURINE 8.0 08/03/2020 1422   GLUCOSEU NEGATIVE 08/03/2020 1422   HGBUR NEGATIVE 08/03/2020 1422   BILIRUBINUR NEGATIVE 08/03/2020 1422   KETONESUR NEGATIVE 08/03/2020 1422   PROTEINUR NEGATIVE 08/03/2020 1422   NITRITE NEGATIVE 08/03/2020 1422   LEUKOCYTESUR NEGATIVE 08/03/2020 1422   Sepsis Labs Invalid input(s): PROCALCITONIN,  WBC,  LACTICIDVEN Microbiology Recent Results (from the past 240 hour(s))  Resp Panel by RT-PCR (Flu A&B, Covid)     Status: None   Collection Time: 08/02/20  1:03 PM  Result Value Ref Range Status   SARS Coronavirus 2 by RT PCR NEGATIVE NEGATIVE Final    Comment: (NOTE) SARS-CoV-2 target nucleic acids are NOT DETECTED.  The SARS-CoV-2 RNA is generally detectable in upper respiratory specimens during the acute phase of infection. The lowest concentration of SARS-CoV-2 viral copies this assay can detect is 138 copies/mL. A negative result does not preclude SARS-Cov-2 infection and should not be used as the sole basis for treatment or other patient management decisions. A negative result may occur with  improper specimen collection/handling, submission of specimen other than nasopharyngeal swab, presence of viral mutation(s)  within the areas targeted by this assay, and inadequate number of viral copies(<138 copies/mL). A negative result must be combined with clinical observations, patient history, and epidemiological information. The expected result is Negative.  Fact Sheet for Patients:  EntrepreneurPulse.com.au  Fact Sheet for Healthcare Providers:  IncredibleEmployment.be  This test is no t yet approved or cleared by the [place] FDA and  has been authorized for detection and/or diagnosis of SARS-CoV-2 by FDA under an Emergency Use Authorization (EUA). This EUA  will remain  in effect (meaning this test can be used) for the duration of the COVID-19 declaration under Section 564(b)(1) of the Act, 21 U.S.C.section 360bbb-3(b)(1), unless the authorization is terminated  or revoked sooner.       Influenza A by PCR NEGATIVE NEGATIVE Final   Influenza B by PCR NEGATIVE NEGATIVE Final    Comment: (NOTE) The Xpert Xpress SARS-CoV-2/FLU/RSV plus assay is intended as an aid in the diagnosis of influenza from Nasopharyngeal swab specimens and should not be used as a sole basis for treatment. Nasal washings and aspirates are unacceptable for Xpert Xpress SARS-CoV-2/FLU/RSV testing.  Fact Sheet for Patients: EntrepreneurPulse.com.au  Fact Sheet for Healthcare Providers: IncredibleEmployment.be  This test is not yet approved or cleared by the [place] FDA and has been authorized for detection and/or diagnosis of SARS-CoV-2 by FDA under an Emergency Use Authorization (EUA). This EUA will remain in effect (meaning this test can be used) for the duration of the COVID-19 declaration under Section 564(b)(1) of the Act, 21 U.S.C. section 360bbb-3(b)(1), unless the authorization is terminated or revoked.  Performed at [facility], [address], [place], [place] [address]      Time coordinating discharge:   35 minutes  SIGNED:   Barb Merino, MD  [facility] 08/06/2020, 11:38 AM

## 2020-08-06 NOTE — Progress Notes (Signed)
I have sent a message to our office's scheduling team requesting a follow-up appointment, and our office will call the patient with this information.   

## 2020-08-11 ENCOUNTER — Emergency Department (HOSPITAL_BASED_OUTPATIENT_CLINIC_OR_DEPARTMENT_OTHER)
Admission: EM | Admit: 2020-08-11 | Discharge: 2020-08-11 | Disposition: A | Discharge status: Home / Self Care | Payer: Medicare (Managed Care) | Attending: Emergency Medicine | Admitting: Emergency Medicine

## 2020-08-11 ENCOUNTER — Emergency Department (HOSPITAL_BASED_OUTPATIENT_CLINIC_OR_DEPARTMENT_OTHER): Payer: Medicare (Managed Care) | Admitting: Radiology

## 2020-08-11 ENCOUNTER — Emergency Department (HOSPITAL_BASED_OUTPATIENT_CLINIC_OR_DEPARTMENT_OTHER): Payer: Medicare (Managed Care)

## 2020-08-11 ENCOUNTER — Encounter (HOSPITAL_BASED_OUTPATIENT_CLINIC_OR_DEPARTMENT_OTHER): Payer: Self-pay | Admitting: Emergency Medicine

## 2020-08-11 DIAGNOSIS — S34109A Unspecified injury to unspecified level of lumbar spinal cord, initial encounter: Secondary | ICD-10-CM | POA: Diagnosis present

## 2020-08-11 DIAGNOSIS — R200 Anesthesia of skin: Secondary | ICD-10-CM | POA: Diagnosis not present

## 2020-08-11 DIAGNOSIS — H1132 Conjunctival hemorrhage, left eye: Secondary | ICD-10-CM | POA: Diagnosis not present

## 2020-08-11 DIAGNOSIS — Z87891 Personal history of nicotine dependence: Secondary | ICD-10-CM | POA: Insufficient documentation

## 2020-08-11 DIAGNOSIS — J45909 Unspecified asthma, uncomplicated: Secondary | ICD-10-CM | POA: Diagnosis not present

## 2020-08-11 DIAGNOSIS — I480 Paroxysmal atrial fibrillation: Secondary | ICD-10-CM | POA: Insufficient documentation

## 2020-08-11 DIAGNOSIS — Z79899 Other long term (current) drug therapy: Secondary | ICD-10-CM | POA: Insufficient documentation

## 2020-08-11 DIAGNOSIS — I5031 Acute diastolic (congestive) heart failure: Secondary | ICD-10-CM | POA: Diagnosis not present

## 2020-08-11 DIAGNOSIS — X58XXXA Exposure to other specified factors, initial encounter: Secondary | ICD-10-CM | POA: Insufficient documentation

## 2020-08-11 DIAGNOSIS — Z7901 Long term (current) use of anticoagulants: Secondary | ICD-10-CM | POA: Insufficient documentation

## 2020-08-11 DIAGNOSIS — I110 Hypertensive heart disease with heart failure: Secondary | ICD-10-CM | POA: Diagnosis not present

## 2020-08-11 DIAGNOSIS — S32000A Wedge compression fracture of unspecified lumbar vertebra, initial encounter for closed fracture: Secondary | ICD-10-CM

## 2020-08-11 DIAGNOSIS — J9621 Acute and chronic respiratory failure with hypoxia: Secondary | ICD-10-CM

## 2020-08-11 DIAGNOSIS — M5416 Radiculopathy, lumbar region: Secondary | ICD-10-CM | POA: Diagnosis not present

## 2020-08-11 NOTE — ED Notes (Signed)
Patient arrived to ED with EMS. Patient on O2 tank in waiting room. Patient placed on 2 L [place] w/ FULL O2 tank. RT will continue to monitor patient.

## 2020-08-11 NOTE — ED Notes (Signed)
Patient transported to x-ray. ?

## 2020-08-11 NOTE — ED Provider Notes (Signed)
[name] EMERGENCY DEPT Provider Note   CSN: [id number] Arrival date & time: 08/11/20  1025     History Chief Complaint  Patient presents with   leg numbness   Eye Problem    Lindsay [name] is a 80 y.o. female.  Lindsay [name] was discharged from the hospital 5 days ago after a 5-day hospitalization for congestive heart failure.  She states that she was not very ambulatory during her hospitalization but that she is not aware of any odd positions or pressure against her right hip.  During the hospitalization, she noted some numbness at the right lateral aspect of her thigh which extends to her ankle.  It has persisted, and when she divulge this information to her daughter, her daughter insisted she be evaluated.  She denies back pain but does have a history of multiple compression fractures during which time she had no knowledge of injury and no pain.  She also denies any other neurologic symptoms.  No bowel or bladder incontinence.  No fevers or chills.  She is using oxygen mostly continuously, and she denies any change in her respiratory status.  She notes that her blood pressure runs low at baseline.  He also notes that she had to blow her nose forcefully yesterday, and it was slightly bloody.  Today she woke up with a bloody left eye.  She is on Eliquis for DVT.  No vision changes.  The history is provided by the patient and a relative (daughter).  Neurologic Problem This is a new problem. The current episode started more than 1 week ago. The problem occurs constantly. The problem has not changed since onset.Pertinent negatives include no chest pain, no abdominal pain, no headaches and no shortness of breath. Nothing aggravates the symptoms. Nothing relieves the symptoms. She has tried nothing for the symptoms. The treatment provided no relief.      Past Medical History:  Diagnosis Date   Asthma    Atrial fibrillation (HCC)    High cholesterol    HTN (hypertension)     Respiratory failure with hypoxia [facility])     Patient Active Problem List   Diagnosis Date Noted   Achalasia of esophagus 40/98/1191   Acute diastolic CHF (congestive heart failure) ([place]) 08/03/2020   Acute on chronic respiratory failure with hypoxia ([place]) 08/02/2020   Hyponatremia 08/02/2020   Hypokalemia 08/02/2020   Dysphagia 08/02/2020   Asthma 08/02/2020   AF (paroxysmal atrial fibrillation) ([place]) 08/02/2020   Chronic anticoagulation 08/02/2020   Acquired thrombophilia ([place]) 08/02/2020    Past Surgical History:  Procedure Laterality Date   ESOPHAGOGASTRODUODENOSCOPY N/A 08/05/2020   Procedure: ESOPHAGOGASTRODUODENOSCOPY (EGD);  Surgeon: Otis Brace, MD;  Location: [facility];  Service: Gastroenterology;  Laterality: N/A;     OB History   No obstetric history on file.     Family History  Problem Relation Age of Onset   Breast cancer Sister    Hypertension Other     Social History   Tobacco Use   Smoking status: Former    Types: Cigarettes   Smokeless tobacco: Never   Tobacco comments:    Quit remotely 1970s  Substance Use Topics   Alcohol use: Not Currently    Home Medications Prior to Admission medications   Medication Sig Start Date End Date Taking? Authorizing Provider  albuterol (VENTOLIN HFA) 108 (90 Base) MCG/ACT inhaler Inhale 1-2 puffs into the lungs every 6 (six) hours as needed for wheezing or shortness of breath. 02/17/20   Freda Jackson  B, MD  alendronate (FOSAMAX) 70 MG tablet Take 70 mg by mouth once a week. Saturdays 02/10/20   [provider]  apixaban (ELIQUIS) 5 MG TABS tablet Take 5 mg by mouth 2 (two) times daily.    [provider]  atorvastatin (LIPITOR) 20 MG tablet Take 20 mg by mouth daily. 02/12/20   [provider]  benzonatate (TESSALON) 200 MG capsule Take 200 mg by mouth daily as needed for cough.    [provider]  Calcium-Magnesium-Vitamin D (CALCIUM 1200+D3 PO) Take 1 tablet by mouth  daily.    [provider]  Dextromethorphan-Guaifenesin 60-1200 MG 12hr tablet Take 2 tablets by mouth every 12 (twelve) hours.    [provider]  ipratropium-albuterol (DUONEB) 0.5-2.5 (3) MG/3ML SOLN Inhale 3 mLs into the lungs every 6 (six) hours as needed (wheezing).    [provider]  LORazepam (ATIVAN) 1 MG tablet Take 1 mg by mouth 2 (two) times daily. 02/12/20   [provider]  metoprolol succinate (TOPROL-XL) 50 MG 24 hr tablet Take 50 mg by mouth 2 (two) times daily. 02/10/20   [provider]  mirtazapine (REMERON) 45 MG tablet Take 45 mg by mouth at bedtime. 02/10/20   [provider]  Multiple Vitamin (MULTIVITAMIN WITH MINERALS) TABS tablet Take 1 tablet by mouth daily.    [provider]  OXYGEN Inhale 2 L into the lungs continuous.    [provider]  pantoprazole (PROTONIX) 40 MG tablet Take 1 tablet (40 mg total) by mouth daily. 08/06/20 08/06/21  Barb Merino, MD  potassium chloride SA (KLOR-CON) 20 MEQ tablet Take 1 tablet (20 mEq total) by mouth daily. 08/06/20 09/05/20  Barb Merino, MD  sertraline (ZOLOFT) 100 MG tablet Take 100 mg by mouth daily. 02/10/20   [provider]  torsemide 40 MG TABS Take 40 mg by mouth daily. 08/06/20 09/05/20  Barb Merino, MD    Allergies    Acetaminophen-codeine  Review of Systems   Review of Systems  Constitutional:  Negative for chills and fever.  HENT:  Negative for ear pain and sore throat.   Eyes:  Positive for redness. Negative for pain and visual disturbance.  Respiratory:  Negative for cough and shortness of breath.   Cardiovascular:  Negative for chest pain and palpitations.  Gastrointestinal:  Negative for abdominal pain and vomiting.  Genitourinary:  Negative for dysuria and hematuria.  Musculoskeletal:  Negative for arthralgias and back pain.  Skin:  Negative for color change and rash.  Neurological:  Positive for numbness. Negative for  seizures, syncope and headaches.  All other systems reviewed and are negative.  Physical Exam Updated Vital Signs BP 93/72 (BP Location: Left Arm)   Pulse 93   Temp 98.4 F (36.9 C)   Resp 15   Ht 5\' 9"  (1.753 m)   Wt 97.5 kg   SpO2 92%   BMI 31.75 kg/m   Physical Exam Vitals and nursing note reviewed.  Constitutional:      Appearance: She is well-developed.  HENT:     Head: Normocephalic and atraumatic.  Eyes:     Extraocular Movements: Extraocular movements intact.     Comments: Subconjunctival hemorrhage of the left eye.  Cardiovascular:     Rate and Rhythm: Normal rate and regular rhythm.     Heart sounds: Normal heart sounds.  Pulmonary:     Effort: Pulmonary effort is normal. No tachypnea.     Breath sounds: Normal breath sounds.  Musculoskeletal:  Right lower leg: No edema.     Left lower leg: No edema.     Comments: Limbs are well-perfused  Skin:    General: Skin is warm and dry.  Neurological:     General: No focal deficit present.     Mental Status: She is alert and oriented to person, place, and time.     Sensory: Sensory deficit present.     Motor: No weakness.     Deep Tendon Reflexes: Reflexes normal.     Comments: Mild decrease in light touch to the right leg in the L5 dermatome  Psychiatric:        Mood and Affect: Mood normal.        Behavior: Behavior normal.    ED Results / Procedures / Treatments   Labs (all labs ordered are listed, but only abnormal results are displayed) Labs Reviewed - No data to display  EKG None  Radiology DG Lumbar Spine Complete  Result Date: 08/11/2020 CLINICAL DATA:  Lumbar radiculopathy EXAM: LUMBAR SPINE - COMPLETE 4+ VIEW COMPARISON:  None. FINDINGS: Multiple compression deformities T10-L4, age indeterminate. Narrowing of the L5-S1 interspace. Osteitis pubis. IMPRESSION: Multiple thoracolumbar compression fracture deformities, age indeterminate. Electronically Signed   By: Lucrezia Europe M.D.   On: 08/11/2020  16:02   CT Head Wo Contrast  Result Date: 08/11/2020 CLINICAL DATA:  Neuro deficit, acute, stroke suspected. C/o R leg numbness to the outer aspect of her leg for several days. Also reports L eye pain and redness since yesterday. HX of htn, a fib EXAM: CT HEAD WITHOUT CONTRAST TECHNIQUE: Contiguous axial images were obtained from the base of the skull through the vertex without intravenous contrast. COMPARISON:  None. FINDINGS: Brain: Patchy and confluent areas of decreased attenuation are noted throughout the deep and periventricular white matter of the cerebral hemispheres bilaterally, compatible with chronic microvascular ischemic disease. No evidence of large-territorial acute infarction. No parenchymal hemorrhage. No mass lesion. No extra-axial collection. No mass effect or midline shift. No hydrocephalus. Basilar cisterns are patent. Vascular: No hyperdense vessel. Skull: No acute fracture or focal lesion. Sinuses/Orbits: Paranasal sinuses and mastoid air cells are clear. The orbits are unremarkable. Other: None. IMPRESSION: No acute intracranial abnormality. Electronically Signed   By: Iven Finn M.D.   On: 08/11/2020 16:03    Procedures Procedures   Medications Ordered in ED Medications - No data to display  ED Course  I have reviewed the triage vital signs and the nursing notes.  Pertinent labs & imaging results that were available during my care of the patient were reviewed by me and considered in my medical decision making (see chart for details).    MDM Rules/Calculators/A&P                           Lurlie Wigen has multiple chronic medical conditions and recently was hospitalized for congestive heart failure.  She presented for right leg numbness that has been ongoing for over a week and left eye redness.  I was initially worried because she had some transient hypotension and a little bit of hypoxia.  However, she has baseline low blood pressures and does use oxygen  continuously.  She actually looked very clinically well and compensated with respect to her CHF.  As a result, I did not order lab values.  I discussed this with the patient and her daughter, and they both agreed.  Her leg numbness appears to be radicular in etiology.  I did consider a peripheral neuropathy such as lateral cutaneous neuropathy.  However, the radiation of symptoms down to her ankle effectively excludes this diagnosis.  Aside from mild sensory changes, she has no acute neurologic deficits.  She is planning to start home physical therapy, and I think this would be a good first step in treating her symptoms.  If her symptoms persist, EMG would be a possibility.  She is opposed to MRI because she is quite claustrophobic.  As far as her eye, this will resolve on its own. Final Clinical Impression(s) / ED Diagnoses Final diagnoses:  Subconjunctival hemorrhage of left eye  Lumbar radiculopathy  Compression fracture of lumbar vertebra, unspecified lumbar vertebral level, initial encounter (HCC)  Acute diastolic CHF (congestive heart failure) (HCC)  Acute on chronic respiratory failure with hypoxia [facility])    Rx / DC Orders ED Discharge Orders     None        Arnaldo Natal, MD 08/11/20 1642

## 2020-08-11 NOTE — ED Triage Notes (Addendum)
C/o R leg numbness to the outer aspect of her leg for several days. Also reports L eye pain and redness since yesterday.

## 2020-08-11 NOTE — ED Notes (Signed)
Pt up to bathroom with assistance 

## 2020-08-23 NOTE — Progress Notes (Signed)
Office Visit    [name] Date of Encounter: 08/23/2020  PCP:  Bernerd Limbo, MD   [place]  Cardiologist:  Pixie Casino, MD  Advanced Practice Provider:  No care team member to display Electrophysiologist:  None   Chief Complaint    Lindsay [name] is a 80 y.o. 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    Past Medical History    Past Medical History:  Diagnosis Date   Asthma    Atrial fibrillation (HCC)    High cholesterol    HTN (hypertension)    Respiratory failure with hypoxia [facility])    Past Surgical History:  Procedure Laterality Date   ESOPHAGOGASTRODUODENOSCOPY N/A 08/05/2020   Procedure: ESOPHAGOGASTRODUODENOSCOPY (EGD);  Surgeon: Otis Brace, MD;  Location: [facility];  Service: Gastroenterology;  Laterality: N/A;    Allergies  Allergies  Allergen Reactions   Acetaminophen-Codeine     Other reaction(s): Agitation    History of Present Illness    Lindsay [name] is a 80 y.o. 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  She has been predominantly managed by primary care. Her daughter is an Therapist, sports. Atrial fibrillation diagnosed many years ago and Eliquis initiated by primary care. Managed on Torsemide for chronic edema with improvement in fall 2021 after addition of Metolazone.   She was admitted 08/02/20 with acute on chronic hypoxic respiratory failure due to acute on chronic diastolic heart failure. 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 She was treated with IV diuresis. She was evaluated by speech therapy  and also underwent endoscopy with achalasia recommended for soft diet. She was discharged with [facility].  Repeat ED visit 08/11/20 with right leg numbness and elft eye redness. Her leg numbness was radicular in etiology. She was recommended to start PT and consider EMG if symptoms persist.   She presents today for follow up with her daughter. Tells me she feels "okay" since being home. She has been wearing her 2L of oxygen pretty consistently. Tells me today she "does not feel good". She endorses congested cough for 3 days which has improved some with Muccinex. Reports no sick contacts. She does live in an ALF and tells me two people have been quarantined in the last few weeks. She had 2 COVID-19 vaccines and 1 booster.   They have not started home health therapies yet. They have had difficulty getting home health approved. She is taking two of her Torsemide '20mg'$  daily in the morning and 2 potassium 65mq in the morning.  Reports 1 day of worsening edema but otherwise well controlled. Notes palpitations with more than usual activity but not all day long.    EKGs/Labs/Other Studies Reviewed:   The following studies were reviewed today:  2D Echo 08/03/20  1. Left ventricular ejection fraction, by estimation, is 60 to 65%. The  left ventricle has normal function. The left ventricle has no regional  wall motion abnormalities. Left ventricular diastolic function could not  be evaluated.   2. Right ventricular systolic function is normal. The right ventricular  size is mildly enlarged.   3. The mitral valve is normal in  structure. Trivial mitral valve  regurgitation. No evidence of mitral stenosis.   4. The aortic valve is tricuspid. Aortic valve regurgitation is not  visualized. Mild aortic valve sclerosis is present, with no evidence of  aortic valve stenosis.   5. The inferior vena cava is normal in size with greater than 50%  respiratory variability, suggesting right atrial pressure of 3 mmHg.     EKG:  EKG is ordered today.  The ekg ordered today demonstrates rate controlled atrial fibrillation 90 bpm with no acute ST/T wave changes.   Recent Labs: 08/02/2020: B Natriuretic Peptide 305.4 08/03/2020: ALT 15; TSH 1.648 08/04/2020: Hemoglobin 14.4; Platelets 296 08/05/2020: Magnesium 2.2 08/06/2020: BUN 25; Creatinine, Ser 0.96; Potassium 4.0; Sodium 134  Recent Lipid Panel    Component Value Date/Time   CHOL 179 08/04/2020 0359   TRIG 64 08/04/2020 0359   HDL 74 08/04/2020 0359   CHOLHDL 2.4 08/04/2020 0359   VLDL 13 08/04/2020 0359   LDLCALC 92 08/04/2020 0359    Home Medications   No outpatient medications have been marked as taking for the 08/24/20 encounter (Appointment) with Loel Dubonnet, NP.     Review of Systems     All other systems reviewed and are otherwise negative except as noted above.  Physical Exam    VS:  There were no vitals taken for this visit. , BMI There is no height or weight on file to calculate BMI.  Wt Readings from Last 3 Encounters:  08/11/20 215 lb (97.5 kg)  08/04/20 238 lb 1.6 oz (108 kg)  02/17/20 230 lb 9.6 oz (104.6 kg)     GEN: Well nourished, well developed, in no acute distress. HEENT: normal. Neck: Supple, no JVD, carotid bruits, or masses. Cardiac: Irregularly irregular, no murmurs, rubs, or gallops. No clubbing, cyanosis, edema.  Radials/PT 2+ and equal bilaterally.  Respiratory:  Respirations regular and unlabored, clear to auscultation bilaterally. GI: Soft, nontender, nondistended. MS: No deformity or atrophy. Skin: Warm and dry, no rash. Neuro:  Strength and sensation are intact. Psych: Normal affect.  Assessment & Plan    Chronic hypoxic respiratory failure / HFpEF - Grossly euvolemic and well compensated on exam.  Does note mild worsening lower extremity edema.  She will take torsemide 60 mg for 2 days then resume her 40 mg daily dosing. Heart healthy diet and regular cardiovascular exercise encouraged.     Erythema by EGD -noted by EGD during recent admission.  Start Protonix '20mg'$  QD.   Atrial fibrillation / Chronic anticoagulation -rate controlled by EKG today.  Continue present dose of Toprol 50 mg daily.  Continue Eliquis 5 mg twice daily.  Does not meet dose reduction criteria.  Plan for cardioversion to restore NSR.  Denies missed doses of Eliquis.  CBC, CMP ordered today.  Shared Decision Making/Informed Consent{ The risks (stroke, cardiac arrhythmias rarely resulting in the need for a temporary or permanent pacemaker, skin irritation or burns and complications associated with conscious sedation including aspiration, arrhythmia, respiratory failure and death), benefits (restoration of normal sinus rhythm) and alternatives of a direct current cardioversion were explained in detail to Ms. Todt and she agrees to proceed.    HLD -continue atorvastatin.  Direct LDL, CMP ordered today.  Hyokalemia -likely due to diuretic therapy.  Continue present potassium supplement.  Refill provided  Obesity - Weight loss via diet and exercise encouraged. Discussed the impact being overweight would have on cardiovascular risk, atrial fibrillation, HLD, and HFpEF.   Disposition: Follow up  2 weeks after cardioversion  with Dr. Debara Pickett or APP.  Signed, Loel Dubonnet, NP 08/23/2020, 9:10 PM [place]

## 2020-08-23 NOTE — H&P (View-Only) (Signed)
Office Visit    [name] Date of Encounter: 08/23/2020  PCP:  Bernerd Limbo, MD   [place]  Cardiologist:  Pixie Casino, MD  Advanced Practice Provider:  No care team member to display Electrophysiologist:  None   Chief Complaint    Lindsay [name] is a 80 y.o. 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    Past Medical History    Past Medical History:  Diagnosis Date   Asthma    Atrial fibrillation (HCC)    High cholesterol    HTN (hypertension)    Respiratory failure with hypoxia [facility])    Past Surgical History:  Procedure Laterality Date   ESOPHAGOGASTRODUODENOSCOPY N/A 08/05/2020   Procedure: ESOPHAGOGASTRODUODENOSCOPY (EGD);  Surgeon: Otis Brace, MD;  Location: [facility];  Service: Gastroenterology;  Laterality: N/A;    Allergies  Allergies  Allergen Reactions   Acetaminophen-Codeine     Other reaction(s): Agitation    History of Present Illness    Lindsay [name] is a 80 y.o. 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  She has been predominantly managed by primary care. Her daughter is an Therapist, sports. Atrial fibrillation diagnosed many years ago and Eliquis initiated by primary care. Managed on Torsemide for chronic edema with improvement in fall 2021 after addition of Metolazone.   She was admitted 08/02/20 with acute on chronic hypoxic respiratory failure due to acute on chronic diastolic heart failure. 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 She was treated with IV diuresis. She was evaluated by speech therapy  and also underwent endoscopy with achalasia recommended for soft diet. She was discharged with [facility].  Repeat ED visit 08/11/20 with right leg numbness and elft eye redness. Her leg numbness was radicular in etiology. She was recommended to start PT and consider EMG if symptoms persist.   She presents today for follow up with her daughter. Tells me she feels "okay" since being home. She has been wearing her 2L of oxygen pretty consistently. Tells me today she "does not feel good". She endorses congested cough for 3 days which has improved some with Muccinex. Reports no sick contacts. She does live in an ALF and tells me two people have been quarantined in the last few weeks. She had 2 COVID-19 vaccines and 1 booster.   They have not started home health therapies yet. They have had difficulty getting home health approved. She is taking two of her Torsemide '20mg'$  daily in the morning and 2 potassium 76mq in the morning.  Reports 1 day of worsening edema but otherwise well controlled. Notes palpitations with more than usual activity but not all day long.    EKGs/Labs/Other Studies Reviewed:   The following studies were reviewed today:  2D Echo 08/03/20  1. Left ventricular ejection fraction, by estimation, is 60 to 65%. The  left ventricle has normal function. The left ventricle has no regional  wall motion abnormalities. Left ventricular diastolic function could not  be evaluated.   2. Right ventricular systolic function is normal. The right ventricular  size is mildly enlarged.   3. The mitral valve is normal in  structure. Trivial mitral valve  regurgitation. No evidence of mitral stenosis.   4. The aortic valve is tricuspid. Aortic valve regurgitation is not  visualized. Mild aortic valve sclerosis is present, with no evidence of  aortic valve stenosis.   5. The inferior vena cava is normal in size with greater than 50%  respiratory variability, suggesting right atrial pressure of 3 mmHg.     EKG:  EKG is ordered today.  The ekg ordered today demonstrates rate controlled atrial fibrillation 90 bpm with no acute ST/T wave changes.   Recent Labs: 08/02/2020: B Natriuretic Peptide 305.4 08/03/2020: ALT 15; TSH 1.648 08/04/2020: Hemoglobin 14.4; Platelets 296 08/05/2020: Magnesium 2.2 08/06/2020: BUN 25; Creatinine, Ser 0.96; Potassium 4.0; Sodium 134  Recent Lipid Panel    Component Value Date/Time   CHOL 179 08/04/2020 0359   TRIG 64 08/04/2020 0359   HDL 74 08/04/2020 0359   CHOLHDL 2.4 08/04/2020 0359   VLDL 13 08/04/2020 0359   LDLCALC 92 08/04/2020 0359    Home Medications   No outpatient medications have been marked as taking for the 08/24/20 encounter (Appointment) with Loel Dubonnet, NP.     Review of Systems     All other systems reviewed and are otherwise negative except as noted above.  Physical Exam    VS:  There were no vitals taken for this visit. , BMI There is no height or weight on file to calculate BMI.  Wt Readings from Last 3 Encounters:  08/11/20 215 lb (97.5 kg)  08/04/20 238 lb 1.6 oz (108 kg)  02/17/20 230 lb 9.6 oz (104.6 kg)     GEN: Well nourished, well developed, in no acute distress. HEENT: normal. Neck: Supple, no JVD, carotid bruits, or masses. Cardiac: Irregularly irregular, no murmurs, rubs, or gallops. No clubbing, cyanosis, edema.  Radials/PT 2+ and equal bilaterally.  Respiratory:  Respirations regular and unlabored, clear to auscultation bilaterally. GI: Soft, nontender, nondistended. MS: No deformity or atrophy. Skin: Warm and dry, no rash. Neuro:  Strength and sensation are intact. Psych: Normal affect.  Assessment & Plan    Chronic hypoxic respiratory failure / HFpEF - Grossly euvolemic and well compensated on exam.  Does note mild worsening lower extremity edema.  She will take torsemide 60 mg for 2 days then resume her 40 mg daily dosing. Heart healthy diet and regular cardiovascular exercise encouraged.     Erythema by EGD -noted by EGD during recent admission.  Start Protonix '20mg'$  QD.   Atrial fibrillation / Chronic anticoagulation -rate controlled by EKG today.  Continue present dose of Toprol 50 mg daily.  Continue Eliquis 5 mg twice daily.  Does not meet dose reduction criteria.  Plan for cardioversion to restore NSR.  Denies missed doses of Eliquis.  CBC, CMP ordered today.  Shared Decision Making/Informed Consent{ The risks (stroke, cardiac arrhythmias rarely resulting in the need for a temporary or permanent pacemaker, skin irritation or burns and complications associated with conscious sedation including aspiration, arrhythmia, respiratory failure and death), benefits (restoration of normal sinus rhythm) and alternatives of a direct current cardioversion were explained in detail to Ms. Luberto and she agrees to proceed.    HLD -continue atorvastatin.  Direct LDL, CMP ordered today.  Hyokalemia -likely due to diuretic therapy.  Continue present potassium supplement.  Refill provided  Obesity - Weight loss via diet and exercise encouraged. Discussed the impact being overweight would have on cardiovascular risk, atrial fibrillation, HLD, and HFpEF.   Disposition: Follow up  2 weeks after cardioversion  with Dr. Debara Pickett or APP.  Signed, Loel Dubonnet, NP 08/23/2020, 9:10 PM [place]

## 2020-08-24 ENCOUNTER — Encounter (HOSPITAL_BASED_OUTPATIENT_CLINIC_OR_DEPARTMENT_OTHER): Payer: Self-pay | Admitting: Family

## 2020-08-24 ENCOUNTER — Other Ambulatory Visit: Payer: Self-pay

## 2020-08-24 ENCOUNTER — Ambulatory Visit (HOSPITAL_BASED_OUTPATIENT_CLINIC_OR_DEPARTMENT_OTHER): Payer: Medicare (Managed Care) | Admitting: Family

## 2020-08-24 VITALS — BP 104/70 | HR 90 | Ht 69.0 in | Wt 227.0 lb

## 2020-08-24 DIAGNOSIS — E782 Mixed hyperlipidemia: Secondary | ICD-10-CM | POA: Diagnosis not present

## 2020-08-24 DIAGNOSIS — I5032 Chronic diastolic (congestive) heart failure: Secondary | ICD-10-CM | POA: Diagnosis not present

## 2020-08-24 DIAGNOSIS — E876 Hypokalemia: Secondary | ICD-10-CM | POA: Diagnosis not present

## 2020-08-24 DIAGNOSIS — Z01818 Encounter for other preprocedural examination: Secondary | ICD-10-CM | POA: Diagnosis not present

## 2020-08-24 DIAGNOSIS — Z01810 Encounter for preprocedural cardiovascular examination: Secondary | ICD-10-CM

## 2020-08-24 MED ORDER — TORSEMIDE 40 MG PO TABS: 40.0000 mg | ORAL_TABLET | Freq: Every day | ORAL | 0 refills | Status: DC

## 2020-08-24 MED ORDER — DEMADEX 20 MG PO TABS: ORAL_TABLET | ORAL | 0 refills | Status: DC

## 2020-08-24 MED ORDER — POTASSIUM CHLORIDE ER 10 MEQ PO TBCR: 20.0000 meq | EXTENDED_RELEASE_TABLET | Freq: Every day | ORAL | 1 refills | Status: DC

## 2020-08-24 MED ORDER — PANTOPRAZOLE SODIUM 20 MG PO TBEC: 20.0000 mg | DELAYED_RELEASE_TABLET | Freq: Every day | ORAL | 5 refills | Status: DC

## 2020-08-24 NOTE — Patient Instructions (Addendum)
Medication Instructions:  Your physician has recommended you make the following change in your medication:   START Protonix 64m daily  TAKE Torsemide 3 tablets (60 mg) daily for 2 days in the morning THEN change to Torsemide 2 tablets (40 mg) daily   *If you need a refill on your cardiac medications before your next appointment, please call your pharmacy*  Lab Work: Your physician recommends that you [facility] C[facility], direct LDL  If you have labs (blood work) drawn today and your tests are completely normal, you will receive your results only by: MRaytheon(if you have MPotter OR A paper copy in the mail If you have any lab test that is abnormal or we need to change your treatment, we will call you to review the results.  Testing/Procedures: Your EKG today showed rate controlled atrial fibrillation.  Your physician has recommended that you have a Cardioversion (DCCV). Electrical Cardioversion uses a jolt of electricity to your heart either through paddles or wired patches attached to your chest. This is a controlled, usually prescheduled, procedure. Defibrillation is done under light anesthesia in the hospital, and you usually go home the day of the procedure. This is done to get your heart back into a normal rhythm. You are not awake for the procedure. Please see the instruction sheet given to you today.  Follow-Up: [facility] you and your health needs are our priority.  As part of our continuing mission to provide you with exceptional heart care, we have created designated Provider Care Teams.  These Care Teams include your primary Cardiologist (physician) and Advanced Practice Providers (APPs -  Physician Assistants and Nurse Practitioners) who all work together to provide you with the care you need, when you need it.  We recommend signing up for the patient portal called "MyChart".  Sign up information is  provided on this After Visit Summary.  MyChart is used to connect with patients for Virtual Visits (Telemedicine).  Patients are able to view lab/test results, encounter notes, upcoming appointments, etc.  Non-urgent messages can be sent to your provider as well.   To learn more about what you can do with MyChart, go to [email or web address]    Your next appointment:   2 weeks after your cardioversion  The format for your next appointment:   In Person  Provider:   You may see KPixie Casino MD or one of the following Advanced Practice Providers on your designated Care Team:   HAlmyra Deforest [name] AFabian Sharp [name] or  KRoby Lofts [place]  Other Instructions  CLoel Dubonnet NP will send a note to our Heart & Vascular social worker to see if they can assist with coordinating home health therapies.   Recommend establishing with a primary care provider.  You may call [email or web address] [phone] a list of primary care providers in your area or visit their website [email or web address] have any insurance card available before calling or going online.   You can also use [facility] their phone number [phone]  Recommend weighing daily and keeping a log. Please call our office if you have weight gain of 2 pounds overnight or 5 pounds in 1 week.   Date  Time Weight  Exercises to do While Sitting  Exercises that you do while sitting (chair exercises) can give you many of the same benefits as full exercise. Benefits include strengthening your heart, burning calories, and keeping muscles and joints healthy. Exercise can also improve your mood and help with depression andanxiety. You may benefit from chair exercises if you are unable to do standing exercises because of: Diabetic foot pain. Obesity. Illness. Arthritis. Recovery from surgery or injury. Breathing  problems. Balance problems. Another type of disability. Before starting chair exercises, check with your health care provider or a physical therapist to find out how much exercise you can tolerate and which exercises are safe for you. If your health care provider approves: Start out slowly and build up over time. Aim to work up to about 10-20 minutes for each exercise session. Make exercise part of your daily routine. Drink water when you exercise. Do not wait until you are thirsty. Drink every 10-15 minutes. Stop exercising right away if you have pain, nausea, shortness of breath, or dizziness. If you are exercising in a wheelchair, make sure to lock the wheels. Ask your health care provider whether you can do tai chi or yoga. Many positions in these mind-body exercises can be modified to do while seated. Warm-up Before starting other exercises: Sit up as straight as you can. Have your knees bent at 90 degrees, which is the shape of the capital letter "L." Keep your feet flat on the floor. Sit at the front edge of your chair, if you can. Pull in (tighten) the muscles in your abdomen and stretch your spine and neck as straight as you can. Hold this position for a few minutes. Breathe in and out evenly. Try to concentrate on your breathing, and relax your mind. Stretching Exercise A: Arm stretch Hold your arms out straight in front of your body. Bend your hands at the wrist with your fingers pointing up, as if signaling someone to stop. Notice the slight tension in your forearms as you hold the position. Keeping your arms out and your hands bent, rotate your hands outward as far as you can and hold this stretch. Aim to have your thumbs pointing up and your pinkie fingers pointing down. Slowly repeat arm stretches for one minute as tolerated. Exercise B: Leg stretch If you can move your legs, try to "draw" letters on the floor with the toes of your foot. Write your name with one foot. Write  your name with the toes of your other foot. Slowly repeat the movements for one minute as tolerated. Exercise C: Reach for the sky Reach your hands as far over your head as you can to stretch your spine. Move your hands and arms as if you are climbing a rope. Slowly repeat the movements for one minute as tolerated. Range of motion exercises Exercise A: Shoulder roll Let your arms hang loosely at your sides. Lift just your shoulders up toward your ears, then let them relax back down. When your shoulders feel loose, rotate your shoulders in backward and forward circles. Do shoulder rolls slowly for one minute as tolerated. Exercise B: March in place As if you are marching, pump your arms and lift your legs up and down. Lift your knees as high as you can. If you are unable to lift your knees, just pump your arms and move your ankles and feet up and down. March in place for one minute as tolerated. Exercise C: Seated jumping jacks Let your arms hang  down straight. Keeping your arms straight, lift them up over your head. Aim to point your fingers to the ceiling. While you lift your arms, straighten your legs and slide your heels along the floor to your sides, as wide as you can. As you bring your arms back down to your sides, slide your legs back together. If you are unable to use your legs, just move your arms. Slowly repeat seated jumping jacks for one minute as tolerated. Strengthening exercises Exercise A: Shoulder squeeze Hold your arms straight out from your body to your sides, with your elbows bent and your fists pointed at the ceiling. Keeping your arms in the bent position, move them forward so your elbows and forearms meet in front of your face. Open your arms back out as wide as you can with your elbows still bent, until you feel your shoulder blades squeezing together. Hold for 5 seconds. Slowly repeat the movements forward and backward for one minute as tolerated. Contact a  health care provider if you: Had to stop exercising due to any of the following: Pain. Nausea. Shortness of breath. Dizziness. Fatigue. Have significant pain or soreness after exercising. Get help right away if you have: Chest pain. Difficulty breathing. These symptoms may represent a serious problem that is an emergency. Do not wait to see if the symptoms will go away. Get medical help right away. Call your local emergency services (911 in the [place] Do not drive yourself to the hospital. This information is not intended to replace advice given to you by your health care provider. Make sure you discuss any questions you have with your healthcare provider. Document Revised: 04/21/2019 Document Reviewed: 05/08/2019 Elsevier Patient Education  2022 [employer].

## 2020-08-25 ENCOUNTER — Telehealth: Payer: Self-pay | Admitting: Licensed Clinical Social Worker

## 2020-08-25 ENCOUNTER — Encounter (HOSPITAL_BASED_OUTPATIENT_CLINIC_OR_DEPARTMENT_OTHER): Payer: Self-pay | Admitting: Family

## 2020-08-25 DIAGNOSIS — I5032 Chronic diastolic (congestive) heart failure: Secondary | ICD-10-CM

## 2020-08-25 DIAGNOSIS — R5381 Other malaise: Secondary | ICD-10-CM

## 2020-08-25 NOTE — Telephone Encounter (Signed)
LCSW received another call from [place], she requests updated orders. Previous ones are a month old and hopefully new orders can be sent to [facility] to get therapy authorization complete. Appreciate NP's assistance. These were printed and faxed to [facility] at [phone].   Westley Hummer, MSW, [place]  [phone]

## 2020-08-25 NOTE — Addendum Note (Signed)
Addended by: Loel Dubonnet on: 08/25/2020 03:45 PM   Modules accepted: Orders

## 2020-08-25 NOTE — Telephone Encounter (Signed)
LCSW called and was able to reach [facility] (ALF/ILF coordinator) w/ [facility]. Confirmed pt resides at [facility]. Shared that the family had been concerned that rehab services had not yet started during appointment w/ [place]. From my understanding [employer] can take time to receive authorization for services to start. Confirmed with Rollene Fare that pt clinicals had been sent in to [facility] and that it can take time for them to process with their authorization system. She shares she will have Lindsay [name], their rehab services coordinator to call family to share reason for delay.   I remain available as needed moving forward.   Lindsay [name], MSW, [place]  [phone]

## 2020-08-25 NOTE — Telephone Encounter (Signed)
Thank you so much for the clarification. Much appreciated.   Loel Dubonnet, NP

## 2020-08-26 ENCOUNTER — Other Ambulatory Visit: Payer: Medicare (Managed Care)

## 2020-08-30 ENCOUNTER — Other Ambulatory Visit: Payer: Medicare (Managed Care)

## 2020-08-30 ENCOUNTER — Encounter (HOSPITAL_BASED_OUTPATIENT_CLINIC_OR_DEPARTMENT_OTHER): Payer: Self-pay

## 2020-08-31 ENCOUNTER — Other Ambulatory Visit: Payer: Medicare (Managed Care)

## 2020-08-31 NOTE — Progress Notes (Signed)
Was called by patient stating she wasn't feeling well and wanted to cancel her cardioversion for tomorrow 8/10. She said she would contact the office to reschedule, taking her off schedule tomorrow.

## 2020-09-01 ENCOUNTER — Ambulatory Visit (HOSPITAL_COMMUNITY): Admission: RE | Admit: 2020-09-01 | Payer: Medicare (Managed Care) | Source: Home / Self Care | Admitting: Cardiology

## 2020-09-01 ENCOUNTER — Encounter (HOSPITAL_COMMUNITY): Admission: RE | Payer: Self-pay | Source: Home / Self Care

## 2020-09-01 SURGERY — CARDIOVERSION
Anesthesia: General

## 2020-09-06 NOTE — Telephone Encounter (Addendum)
Spoke with pt's daughter. She state she is ok to reschedule cardioversion for either 8/22 or 8/23. Attempted to contact scheduling and was informed they are closed for today. Daughter made aware that nurse will call first thing in the morning to schedule. Daughter verbalized understanding.

## 2020-09-07 ENCOUNTER — Encounter (HOSPITAL_BASED_OUTPATIENT_CLINIC_OR_DEPARTMENT_OTHER): Payer: Self-pay

## 2020-09-07 ENCOUNTER — Other Ambulatory Visit: Payer: Medicare (Managed Care)

## 2020-09-07 NOTE — Telephone Encounter (Signed)
Called daughter and informed cardioversion has been rescheduled for 8/22 @ 7:30 am with Dr. Margaretann Loveless. Daughter advised to have pt there by 6 am and to have labs completed prior to procedure. Daughter verbalized understanding.

## 2020-09-09 ENCOUNTER — Other Ambulatory Visit: Payer: Self-pay

## 2020-09-09 ENCOUNTER — Other Ambulatory Visit: Payer: Medicare (Managed Care)

## 2020-09-09 ENCOUNTER — Ambulatory Visit (HOSPITAL_BASED_OUTPATIENT_CLINIC_OR_DEPARTMENT_OTHER): Payer: Medicare (Managed Care) | Admitting: Family

## 2020-09-10 LAB — CBC
Hematocrit: 39.9 % (ref 34.0–46.6)
Hemoglobin: 13.1 g/dL (ref 11.1–15.9)
MCH: 31 pg (ref 26.6–33.0)
MCHC: 32.8 g/dL (ref 31.5–35.7)
MCV: 95 fL (ref 79–97)
Platelets: 265 10*3/uL (ref 150–450)
RBC: 4.22 x10E6/uL (ref 3.77–5.28)
RDW: 12.9 % (ref 11.7–15.4)
WBC: 7.4 10*3/uL (ref 3.4–10.8)

## 2020-09-10 LAB — COMPREHENSIVE METABOLIC PANEL
ALT: 14 IU/L (ref 0–32)
AST: 18 IU/L (ref 0–40)
Albumin/Globulin Ratio: 1.3 (ref 1.2–2.2)
Albumin: 3.8 g/dL (ref 3.7–4.7)
Alkaline Phosphatase: 89 IU/L (ref 44–121)
BUN/Creatinine Ratio: 16 (ref 12–28)
BUN: 15 mg/dL (ref 8–27)
Bilirubin Total: 0.3 mg/dL (ref 0.0–1.2)
CO2: 31 mmol/L — ABNORMAL HIGH (ref 20–29)
Calcium: 9.1 mg/dL (ref 8.7–10.3)
Chloride: 98 mmol/L (ref 96–106)
Creatinine, Ser: 0.91 mg/dL (ref 0.57–1.00)
Globulin, Total: 2.9 g/dL (ref 1.5–4.5)
Glucose: 89 mg/dL (ref 65–99)
Potassium: 4 mmol/L (ref 3.5–5.2)
Sodium: 141 mmol/L (ref 134–144)
Total Protein: 6.7 g/dL (ref 6.0–8.5)
eGFR: 64 mL/min/{1.73_m2} (ref >59)

## 2020-09-10 LAB — LDL CHOLESTEROL, DIRECT: LDL Direct: 113 mg/dL — ABNORMAL HIGH (ref 0–99)

## 2020-09-12 NOTE — Anesthesia Preprocedure Evaluation (Addendum)
Anesthesia Evaluation  Patient identified by MRN, date of birth, ID band Patient awake    Reviewed: Allergy & Precautions, H&P , NPO status , Patient's Chart, lab work & pertinent test results  Airway Mallampati: III  TM Distance: >3 FB Neck ROM: Full    Dental no notable dental hx. (+) Teeth Intact, Dental Advisory Given   Pulmonary asthma , former smoker,    Pulmonary exam normal breath sounds clear to auscultation       Cardiovascular Exercise Tolerance: Good hypertension, Pt. on medications and Pt. on home beta blockers +CHF  + dysrhythmias Atrial Fibrillation  Rhythm:Irregular Rate:Normal     Neuro/Psych negative neurological ROS  negative psych ROS   GI/Hepatic negative GI ROS, Neg liver ROS,   Endo/Other  negative endocrine ROS  Renal/GU negative Renal ROS  negative genitourinary   Musculoskeletal   Abdominal   Peds  Hematology negative hematology ROS (+)   Anesthesia Other Findings   Reproductive/Obstetrics negative OB ROS                            Anesthesia Physical Anesthesia Plan  ASA: 3  Anesthesia Plan: General   Post-op Pain Management:    Induction: Intravenous  PONV Risk Score and Plan: 3 and Propofol infusion and Treatment may vary due to age or medical condition  Airway Management Planned: Mask  Additional Equipment:   Intra-op Plan:   Post-operative Plan:   Informed Consent: I have reviewed the patients History and Physical, chart, labs and discussed the procedure including the risks, benefits and alternatives for the proposed anesthesia with the patient or authorized representative who has indicated his/her understanding and acceptance.     Dental advisory given  Plan Discussed with: CRNA  Anesthesia Plan Comments:        Anesthesia Quick Evaluation

## 2020-09-13 ENCOUNTER — Ambulatory Visit (HOSPITAL_COMMUNITY): Payer: Medicare (Managed Care) | Admitting: Anesthesiology

## 2020-09-13 ENCOUNTER — Other Ambulatory Visit: Payer: Self-pay

## 2020-09-13 ENCOUNTER — Encounter (HOSPITAL_COMMUNITY)
Admission: RE | Disposition: A | Discharge status: Home / Self Care | Payer: Self-pay | Source: Home / Self Care | Attending: Internal Medicine

## 2020-09-13 ENCOUNTER — Ambulatory Visit (HOSPITAL_BASED_OUTPATIENT_CLINIC_OR_DEPARTMENT_OTHER)
Admission: RE | Admit: 2020-09-13 | Discharge: 2020-09-13 | Disposition: A | Discharge status: Home / Self Care | Payer: Medicare (Managed Care) | Source: Home / Self Care | Attending: Internal Medicine | Admitting: Internal Medicine

## 2020-09-13 DIAGNOSIS — E785 Hyperlipidemia, unspecified: Secondary | ICD-10-CM | POA: Insufficient documentation

## 2020-09-13 DIAGNOSIS — I110 Hypertensive heart disease with heart failure: Secondary | ICD-10-CM | POA: Insufficient documentation

## 2020-09-13 DIAGNOSIS — Z9981 Dependence on supplemental oxygen: Secondary | ICD-10-CM | POA: Insufficient documentation

## 2020-09-13 DIAGNOSIS — I5030 Unspecified diastolic (congestive) heart failure: Secondary | ICD-10-CM | POA: Insufficient documentation

## 2020-09-13 DIAGNOSIS — R062 Wheezing: Secondary | ICD-10-CM | POA: Diagnosis not present

## 2020-09-13 DIAGNOSIS — Z7901 Long term (current) use of anticoagulants: Secondary | ICD-10-CM | POA: Insufficient documentation

## 2020-09-13 DIAGNOSIS — I4819 Other persistent atrial fibrillation: Secondary | ICD-10-CM | POA: Diagnosis not present

## 2020-09-13 DIAGNOSIS — I4891 Unspecified atrial fibrillation: Secondary | ICD-10-CM | POA: Insufficient documentation

## 2020-09-13 DIAGNOSIS — J9611 Chronic respiratory failure with hypoxia: Secondary | ICD-10-CM | POA: Insufficient documentation

## 2020-09-13 HISTORY — PX: CARDIOVERSION: SHX1299

## 2020-09-13 SURGERY — CARDIOVERSION
Anesthesia: General

## 2020-09-13 MED ORDER — SODIUM CHLORIDE 0.9 % IV SOLN: INTRAVENOUS | Status: DC | PRN

## 2020-09-13 MED ORDER — LIDOCAINE 2% (20 MG/ML) 5 ML SYRINGE
INTRAMUSCULAR | Status: DC | PRN
  Administered 2020-09-13: 60 mg via INTRAVENOUS

## 2020-09-13 MED ORDER — PROPOFOL 10 MG/ML IV BOLUS
INTRAVENOUS | Status: DC | PRN
  Administered 2020-09-13: 40 mg via INTRAVENOUS

## 2020-09-13 MED ORDER — PHENYLEPHRINE 40 MCG/ML (10ML) SYRINGE FOR IV PUSH (FOR BLOOD PRESSURE SUPPORT)
PREFILLED_SYRINGE | INTRAVENOUS | Status: DC | PRN
  Administered 2020-09-13: 80 ug via INTRAVENOUS

## 2020-09-13 NOTE — Discharge Instructions (Signed)

## 2020-09-13 NOTE — Anesthesia Procedure Notes (Signed)
Date/Time: 09/13/2020 7:47 AM Performed by: Trinna Post., CRNA Pre-anesthesia Checklist: Patient identified, Emergency Drugs available, Suction available, Patient being monitored and Timeout performed Patient Re-evaluated:Patient Re-evaluated prior to induction Oxygen Delivery Method: Ambu bag Preoxygenation: Pre-oxygenation with 100% oxygen Induction Type: IV induction Placement Confirmation: positive ETCO2

## 2020-09-13 NOTE — CV Procedure (Signed)
Procedure: Electrical Cardioversion Indications:  Atrial Fibrillation  Procedure Details:  Consent: Risks of procedure as well as the alternatives and risks of each were explained to the (patient/caregiver).  Consent for procedure obtained.  Time Out: Verified patient identification, verified procedure, site/side was marked, verified correct patient position, special equipment/implants available, medications/allergies/relevent history reviewed, required imaging and test results available. PERFORMED.  Patient placed on cardiac monitor, pulse oximetry, supplemental oxygen as necessary.  Sedation given:  propofol per anesthesia Pacer pads placed anterior and posterior chest.  Cardioverted 2 time(s).  Cardioversion with synchronized biphasic 120J, 200J shock.  Evaluation: Findings: Post procedure EKG shows: NSR Complications: None Patient did tolerate procedure well.  Time Spent Directly with the Patient:  30 minutes   [name] 09/13/2020, 7:57 AM

## 2020-09-13 NOTE — Interval H&P Note (Signed)
History and Physical Interval Note:  09/13/2020 7:32 AM  Lindsay [name]  has presented today for surgery, with the diagnosis of A-FIB.  The various methods of treatment have been discussed with the patient and family. After consideration of risks, benefits and other options for treatment, the patient has consented to  Procedure(s): CARDIOVERSION (N/A) as a surgical intervention.  The patient's history has been reviewed, patient examined, no change in status, stable for surgery.  I have reviewed the patient's chart and labs.  Questions were answered to the patient's satisfaction.     [name]

## 2020-09-13 NOTE — Anesthesia Postprocedure Evaluation (Signed)
Anesthesia Post Note  Patient: Lindsay [name]  Procedure(s) Performed: CARDIOVERSION     Patient location during evaluation: Endoscopy Anesthesia Type: General Level of consciousness: awake and alert Pain management: pain level controlled Vital Signs Assessment: post-procedure vital signs reviewed and stable Respiratory status: spontaneous breathing, nonlabored ventilation and respiratory function stable Cardiovascular status: blood pressure returned to baseline and stable Postop Assessment: no apparent nausea or vomiting Anesthetic complications: no   No notable events documented.  Last Vitals:  Vitals:   09/13/20 0810 09/13/20 0821  BP: 107/62 105/63  Pulse: 76 69  Resp: (!) 23 (!) 26  Temp:    SpO2: 96% 95%    Last Pain:  Vitals:   09/13/20 0821  TempSrc:   PainSc: 0-No pain                 Deandrew Hoecker,W. EDMOND

## 2020-09-13 NOTE — Transfer of Care (Signed)
Immediate Anesthesia Transfer of Care Note  Patient: Lindsay [name]  Procedure(s) Performed: CARDIOVERSION  Patient Location: PACU and Endoscopy Unit  Anesthesia Type:General  Level of Consciousness: drowsy  Airway & Oxygen Therapy: Patient Spontanous Breathing and Patient connected to nasal cannula oxygen  Post-op Assessment: Report given to RN and Post -op Vital signs reviewed and stable  Post vital signs: Reviewed and stable  Last Vitals:  Vitals Value Taken Time  BP    Temp    Pulse    Resp    SpO2      Last Pain:  Vitals:   09/13/20 0724  TempSrc: Temporal  PainSc: 0-No pain         Complications: No notable events documented.

## 2020-09-14 ENCOUNTER — Telehealth: Payer: Self-pay | Admitting: Family

## 2020-09-14 ENCOUNTER — Other Ambulatory Visit: Payer: Self-pay

## 2020-09-14 ENCOUNTER — Encounter (HOSPITAL_COMMUNITY): Payer: Self-pay | Admitting: Internal Medicine

## 2020-09-14 ENCOUNTER — Emergency Department (HOSPITAL_COMMUNITY): Payer: Medicare (Managed Care)

## 2020-09-14 ENCOUNTER — Inpatient Hospital Stay (HOSPITAL_COMMUNITY)
Admission: EM | Admit: 2020-09-14 | Discharge: 2020-09-17 | DRG: 291 | Disposition: A | Discharge status: Home / Self Care | Payer: Medicare (Managed Care) | Attending: Internal Medicine | Admitting: Internal Medicine

## 2020-09-14 DIAGNOSIS — J4541 Moderate persistent asthma with (acute) exacerbation: Secondary | ICD-10-CM | POA: Diagnosis present

## 2020-09-14 DIAGNOSIS — Z803 Family history of malignant neoplasm of breast: Secondary | ICD-10-CM

## 2020-09-14 DIAGNOSIS — J9621 Acute and chronic respiratory failure with hypoxia: Secondary | ICD-10-CM | POA: Diagnosis not present

## 2020-09-14 DIAGNOSIS — R062 Wheezing: Secondary | ICD-10-CM

## 2020-09-14 DIAGNOSIS — I5033 Acute on chronic diastolic (congestive) heart failure: Secondary | ICD-10-CM | POA: Diagnosis present

## 2020-09-14 DIAGNOSIS — I4819 Other persistent atrial fibrillation: Secondary | ICD-10-CM | POA: Diagnosis present

## 2020-09-14 DIAGNOSIS — Z9981 Dependence on supplemental oxygen: Secondary | ICD-10-CM

## 2020-09-14 DIAGNOSIS — E669 Obesity, unspecified: Secondary | ICD-10-CM | POA: Diagnosis present

## 2020-09-14 DIAGNOSIS — R7989 Other specified abnormal findings of blood chemistry: Secondary | ICD-10-CM

## 2020-09-14 DIAGNOSIS — M419 Scoliosis, unspecified: Secondary | ICD-10-CM | POA: Diagnosis present

## 2020-09-14 DIAGNOSIS — I248 Other forms of acute ischemic heart disease: Secondary | ICD-10-CM | POA: Diagnosis present

## 2020-09-14 DIAGNOSIS — R778 Other specified abnormalities of plasma proteins: Secondary | ICD-10-CM

## 2020-09-14 DIAGNOSIS — F419 Anxiety disorder, unspecified: Secondary | ICD-10-CM | POA: Diagnosis present

## 2020-09-14 DIAGNOSIS — R5381 Other malaise: Secondary | ICD-10-CM | POA: Diagnosis present

## 2020-09-14 DIAGNOSIS — J45909 Unspecified asthma, uncomplicated: Secondary | ICD-10-CM | POA: Diagnosis present

## 2020-09-14 DIAGNOSIS — Z7983 Long term (current) use of bisphosphonates: Secondary | ICD-10-CM

## 2020-09-14 DIAGNOSIS — I110 Hypertensive heart disease with heart failure: Principal | ICD-10-CM | POA: Diagnosis present

## 2020-09-14 DIAGNOSIS — I509 Heart failure, unspecified: Secondary | ICD-10-CM | POA: Diagnosis present

## 2020-09-14 DIAGNOSIS — J45901 Unspecified asthma with (acute) exacerbation: Secondary | ICD-10-CM | POA: Diagnosis present

## 2020-09-14 DIAGNOSIS — Z6833 Body mass index (BMI) 33.0-33.9, adult: Secondary | ICD-10-CM

## 2020-09-14 DIAGNOSIS — T502X5A Adverse effect of carbonic-anhydrase inhibitors, benzothiadiazides and other diuretics, initial encounter: Secondary | ICD-10-CM | POA: Diagnosis present

## 2020-09-14 DIAGNOSIS — Z79899 Other long term (current) drug therapy: Secondary | ICD-10-CM

## 2020-09-14 DIAGNOSIS — Z7901 Long term (current) use of anticoagulants: Secondary | ICD-10-CM

## 2020-09-14 DIAGNOSIS — Z885 Allergy status to narcotic agent status: Secondary | ICD-10-CM

## 2020-09-14 DIAGNOSIS — Z20822 Contact with and (suspected) exposure to covid-19: Secondary | ICD-10-CM | POA: Diagnosis present

## 2020-09-14 DIAGNOSIS — I480 Paroxysmal atrial fibrillation: Secondary | ICD-10-CM | POA: Diagnosis present

## 2020-09-14 DIAGNOSIS — Z886 Allergy status to analgesic agent status: Secondary | ICD-10-CM

## 2020-09-14 DIAGNOSIS — K220 Achalasia of cardia: Secondary | ICD-10-CM | POA: Diagnosis present

## 2020-09-14 DIAGNOSIS — E875 Hyperkalemia: Secondary | ICD-10-CM | POA: Diagnosis present

## 2020-09-14 DIAGNOSIS — E785 Hyperlipidemia, unspecified: Secondary | ICD-10-CM | POA: Diagnosis present

## 2020-09-14 DIAGNOSIS — E7800 Pure hypercholesterolemia, unspecified: Secondary | ICD-10-CM | POA: Diagnosis present

## 2020-09-14 DIAGNOSIS — Z8249 Family history of ischemic heart disease and other diseases of the circulatory system: Secondary | ICD-10-CM

## 2020-09-14 DIAGNOSIS — J9611 Chronic respiratory failure with hypoxia: Secondary | ICD-10-CM | POA: Diagnosis present

## 2020-09-14 DIAGNOSIS — Z87891 Personal history of nicotine dependence: Secondary | ICD-10-CM

## 2020-09-14 HISTORY — DX: Other specified abnormal findings of blood chemistry: R79.89

## 2020-09-14 HISTORY — DX: Other specified abnormalities of plasma proteins: R77.8

## 2020-09-14 HISTORY — DX: Hyperkalemia: E87.5

## 2020-09-14 LAB — BASIC METABOLIC PANEL
Anion gap: 7 (ref 5–15)
Anion gap: 10 (ref 5–15)
BUN: 10 mg/dL (ref 8–23)
BUN: 12 mg/dL (ref 8–23)
CO2: 31 mmol/L (ref 22–32)
CO2: 30 mmol/L (ref 22–32)
Calcium: 8.9 mg/dL (ref 8.9–10.3)
Calcium: 9 mg/dL (ref 8.9–10.3)
Chloride: 100 mmol/L (ref 98–111)
Chloride: 98 mmol/L (ref 98–111)
Creatinine, Ser: 0.71 mg/dL (ref 0.44–1.00)
Creatinine, Ser: 1 mg/dL (ref 0.44–1.00)
GFR, Estimated: >60 mL/min (ref >60)
GFR, Estimated: 57 mL/min — ABNORMAL LOW (ref >60)
Glucose, Bld: 121 mg/dL — ABNORMAL HIGH (ref 70–99)
Glucose, Bld: 207 mg/dL — ABNORMAL HIGH (ref 70–99)
Potassium: 5.3 mmol/L — ABNORMAL HIGH (ref 3.5–5.1)
Potassium: 3.5 mmol/L (ref 3.5–5.1)
Sodium: 138 mmol/L (ref 135–145)
Sodium: 138 mmol/L (ref 135–145)

## 2020-09-14 LAB — CBC WITH DIFFERENTIAL/PLATELET
Abs Immature Granulocytes: 0.06 10*3/uL (ref 0.00–0.07)
Basophils Absolute: 0 10*3/uL (ref 0.0–0.1)
Basophils Relative: 0 %
Eosinophils Absolute: 0 10*3/uL (ref 0.0–0.5)
Eosinophils Relative: 0 %
HCT: 41.9 % (ref 36.0–46.0)
Hemoglobin: 13 g/dL (ref 12.0–15.0)
Immature Granulocytes: 1 %
Lymphocytes Relative: 5 %
Lymphs Abs: 0.5 10*3/uL — ABNORMAL LOW (ref 0.7–4.0)
MCH: 31.3 pg (ref 26.0–34.0)
MCHC: 31 g/dL (ref 30.0–36.0)
MCV: 101 fL — ABNORMAL HIGH (ref 80.0–100.0)
Monocytes Absolute: 0.4 10*3/uL (ref 0.1–1.0)
Monocytes Relative: 4 %
Neutro Abs: 9.4 10*3/uL — ABNORMAL HIGH (ref 1.7–7.7)
Neutrophils Relative %: 90 %
Platelets: 256 10*3/uL (ref 150–400)
RBC: 4.15 MIL/uL (ref 3.87–5.11)
RDW: 13.5 % (ref 11.5–15.5)
WBC: 10.4 10*3/uL (ref 4.0–10.5)
nRBC: 0 % (ref 0.0–0.2)

## 2020-09-14 LAB — TROPONIN I (HIGH SENSITIVITY)
Troponin I (High Sensitivity): 38 ng/L — ABNORMAL HIGH (ref <18)
Troponin I (High Sensitivity): 39 ng/L — ABNORMAL HIGH (ref <18)

## 2020-09-14 LAB — RESP PANEL BY RT-PCR (FLU A&B, COVID) ARPGX2
Influenza A by PCR: NEGATIVE
Influenza B by PCR: NEGATIVE
SARS Coronavirus 2 by RT PCR: NEGATIVE

## 2020-09-14 LAB — PROCALCITONIN: Procalcitonin: <0.1 ng/mL

## 2020-09-14 LAB — MAGNESIUM: Magnesium: 2.3 mg/dL (ref 1.7–2.4)

## 2020-09-14 LAB — BRAIN NATRIURETIC PEPTIDE: B Natriuretic Peptide: 409.5 pg/mL — ABNORMAL HIGH (ref 0.0–100.0)

## 2020-09-14 MED ORDER — DM-GUAIFENESIN ER 30-600 MG PO TB12
2.0000 | ORAL_TABLET | Freq: Two times a day (BID) | ORAL | Status: DC
  Administered 2020-09-14 – 2020-09-15 (×2): 2 via ORAL
  Filled 2020-09-14 (×2): qty 2

## 2020-09-14 MED ORDER — IPRATROPIUM-ALBUTEROL 0.5-2.5 (3) MG/3ML IN SOLN
3.0000 mL | Freq: Four times a day (QID) | RESPIRATORY_TRACT | Status: DC
  Administered 2020-09-14 – 2020-09-15 (×3): 3 mL via RESPIRATORY_TRACT
  Filled 2020-09-14 (×3): qty 3

## 2020-09-14 MED ORDER — ALBUTEROL SULFATE HFA 108 (90 BASE) MCG/ACT IN AERS
2.0000 | INHALATION_SPRAY | Freq: Once | RESPIRATORY_TRACT | Status: AC
  Administered 2020-09-14: 2 via RESPIRATORY_TRACT
  Filled 2020-09-14: qty 6.7

## 2020-09-14 MED ORDER — IPRATROPIUM-ALBUTEROL 0.5-2.5 (3) MG/3ML IN SOLN
3.0000 mL | Freq: Once | RESPIRATORY_TRACT | Status: AC
  Administered 2020-09-14: 3 mL via RESPIRATORY_TRACT
  Filled 2020-09-14: qty 3

## 2020-09-14 MED ORDER — SODIUM CHLORIDE 0.9% FLUSH: 3.0000 mL | INTRAVENOUS | Status: DC | PRN

## 2020-09-14 MED ORDER — ATORVASTATIN CALCIUM 10 MG PO TABS
20.0000 mg | ORAL_TABLET | Freq: Every evening | ORAL | Status: DC
  Administered 2020-09-15 – 2020-09-16 (×2): 20 mg via ORAL
  Filled 2020-09-14 (×3): qty 2

## 2020-09-14 MED ORDER — ADULT MULTIVITAMIN W/MINERALS CH
1.0000 | ORAL_TABLET | Freq: Every day | ORAL | Status: DC
  Administered 2020-09-15 – 2020-09-17 (×3): 1 via ORAL
  Filled 2020-09-14 (×3): qty 1

## 2020-09-14 MED ORDER — SODIUM CHLORIDE 0.9% FLUSH
3.0000 mL | Freq: Two times a day (BID) | INTRAVENOUS | Status: DC
  Administered 2020-09-14 – 2020-09-17 (×6): 3 mL via INTRAVENOUS

## 2020-09-14 MED ORDER — SODIUM CHLORIDE 0.9 % IV SOLN: 250.0000 mL | INTRAVENOUS | Status: DC | PRN

## 2020-09-14 MED ORDER — FLUTICASONE FUROATE-VILANTEROL 100-25 MCG/INH IN AEPB
1.0000 | INHALATION_SPRAY | Freq: Every day | RESPIRATORY_TRACT | Status: DC
  Administered 2020-09-15 – 2020-09-16 (×2): 1 via RESPIRATORY_TRACT
  Filled 2020-09-14 (×2): qty 28

## 2020-09-14 MED ORDER — ALENDRONATE SODIUM 70 MG PO TABS: 70.0000 mg | ORAL_TABLET | ORAL | Status: DC

## 2020-09-14 MED ORDER — METHYLPREDNISOLONE SODIUM SUCC 125 MG IJ SOLR
120.0000 mg | INTRAMUSCULAR | Status: DC
  Administered 2020-09-15: 120 mg via INTRAVENOUS
  Filled 2020-09-14: qty 2

## 2020-09-14 MED ORDER — METHYLPREDNISOLONE SODIUM SUCC 125 MG IJ SOLR
125.0000 mg | Freq: Once | INTRAMUSCULAR | Status: AC
  Administered 2020-09-14: 125 mg via INTRAVENOUS
  Filled 2020-09-14: qty 2

## 2020-09-14 MED ORDER — POTASSIUM CHLORIDE CRYS ER 10 MEQ PO TBCR
20.0000 meq | EXTENDED_RELEASE_TABLET | Freq: Every day | ORAL | Status: DC
  Administered 2020-09-15 – 2020-09-17 (×3): 20 meq via ORAL
  Filled 2020-09-14 (×4): qty 2

## 2020-09-14 MED ORDER — PANTOPRAZOLE SODIUM 20 MG PO TBEC
20.0000 mg | DELAYED_RELEASE_TABLET | Freq: Every day | ORAL | Status: DC
  Administered 2020-09-14 – 2020-09-17 (×4): 20 mg via ORAL
  Filled 2020-09-14 (×4): qty 1

## 2020-09-14 MED ORDER — APIXABAN 5 MG PO TABS
5.0000 mg | ORAL_TABLET | Freq: Two times a day (BID) | ORAL | Status: DC
  Administered 2020-09-14 – 2020-09-17 (×6): 5 mg via ORAL
  Filled 2020-09-14 (×6): qty 1

## 2020-09-14 MED ORDER — ALBUTEROL (5 MG/ML) CONTINUOUS INHALATION SOLN
7.5000 mg/h | INHALATION_SOLUTION | Freq: Once | RESPIRATORY_TRACT | Status: AC
Start: 2020-09-14 — End: 2020-09-14
  Administered 2020-09-14: 7.5 mg/h via RESPIRATORY_TRACT
  Filled 2020-09-14: qty 20

## 2020-09-14 MED ORDER — ALBUTEROL SULFATE (2.5 MG/3ML) 0.083% IN NEBU: 2.5000 mg | INHALATION_SOLUTION | RESPIRATORY_TRACT | Status: DC | PRN

## 2020-09-14 MED ORDER — FUROSEMIDE 10 MG/ML IJ SOLN
60.0000 mg | Freq: Once | INTRAMUSCULAR | Status: AC
  Administered 2020-09-14: 60 mg via INTRAVENOUS
  Filled 2020-09-14: qty 6

## 2020-09-14 MED ORDER — UMECLIDINIUM BROMIDE 62.5 MCG/INH IN AEPB
1.0000 | INHALATION_SPRAY | Freq: Every day | RESPIRATORY_TRACT | Status: DC
  Administered 2020-09-15 – 2020-09-17 (×4): 1 via RESPIRATORY_TRACT
  Filled 2020-09-14 (×2): qty 7

## 2020-09-14 MED ORDER — MIRTAZAPINE 15 MG PO TABS
45.0000 mg | ORAL_TABLET | Freq: Every day | ORAL | Status: DC
  Administered 2020-09-14 – 2020-09-16 (×3): 45 mg via ORAL
  Filled 2020-09-14 (×3): qty 3

## 2020-09-14 MED ORDER — SODIUM CHLORIDE 0.9 % IV SOLN
3.0000 g | Freq: Once | INTRAVENOUS | Status: AC
  Administered 2020-09-14: 3 g via INTRAVENOUS
  Filled 2020-09-14: qty 8

## 2020-09-14 MED ORDER — LORAZEPAM 1 MG PO TABS
1.0000 mg | ORAL_TABLET | Freq: Two times a day (BID) | ORAL | Status: DC
  Administered 2020-09-14 – 2020-09-17 (×6): 1 mg via ORAL
  Filled 2020-09-14 (×6): qty 1

## 2020-09-14 MED ORDER — MAGNESIUM SULFATE 2 GM/50ML IV SOLN
2.0000 g | Freq: Once | INTRAVENOUS | Status: AC
  Administered 2020-09-14: 2 g via INTRAVENOUS
  Filled 2020-09-14: qty 50

## 2020-09-14 MED ORDER — SERTRALINE HCL 100 MG PO TABS
100.0000 mg | ORAL_TABLET | Freq: Every day | ORAL | Status: DC
  Administered 2020-09-14 – 2020-09-16 (×3): 100 mg via ORAL
  Filled 2020-09-14 (×3): qty 1

## 2020-09-14 MED ORDER — IOHEXOL 350 MG/ML SOLN
80.0000 mL | Freq: Once | INTRAVENOUS | Status: AC | PRN
  Administered 2020-09-14: 80 mL via INTRAVENOUS

## 2020-09-14 NOTE — ED Provider Notes (Signed)
Please see prior provider note for full H&P.  Briefly patient is an 80 yo female who presents with chest pain, dyspnea & vomiting, recent cardioversion yesterday. Found to have acute on chronic hypoxic respiratory failure felt to be multifactorial- CHF, COPD, and possible aspiration pneumonia. Despite nebs, steroids, diuresis continues to have increased O2 requirement.   Discussed w/ cardiology master- admit to medicine, relays medicine team can consult cardiology as needed.   16:48: CONSULT: Discussed with hospitalist Dr. Rogers Blocker- accepts admission.   Leafy Kindle 09/14/20 1653    Carmin Muskrat, MD 09/15/20 [phone]

## 2020-09-14 NOTE — ED Triage Notes (Signed)
"  Had a scheduled cardioversion yesterday and it was successful and I went home, then last night I got shortness of breath even with my oxygen on" per pt   "O2 Sats were initially 84 on our arrival, she has nasal congestion so we used a mask to get her sats back up" per EMS

## 2020-09-14 NOTE — H&P (Signed)
History and Physical    Lindsay [name] [id number] DOB: Sep 16, 1940 DOA: 09/14/2020  PCP: Bernerd Limbo, MD Consultants:  pulmonary Dr. Erin Fulling, cardiology: Dr. Debara Pickett Patient coming from:  [place]   Chief Complaint: shortness of breath   HPI: Lindsay [name] is a 80 y.o. 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 She woke up and called her daughter in the middle of the night and told her she couldn't breath. Pulse ox was 88%. She could not speak in complete sentences. She then had an episode of vomiting, but didn't want to come to hospital. She went back to sleep and her daughter called her at [facility] and then called her primary care. By the time she called her back, she told her daughter to call EMS because she couldn't breath. She held her toresemide yesterday before her procedure.   She denies any fever/chills/or illness. She has a chronic cough that has not changed. She hasn't had a headache, vision changes or palpitations. Her chest is sore from her cardioversion, but no chest pain. No N/V/D or stomach pain outside one episode of vomiting last night. No dysuria or leg swelling.   No environmental exposures that may have triggered asthma flair. Baseline she has shortness of breath with exertion and can maybe go 10 feet and has to sit down.     ED Course: vitals: afebrile, bp: 109/75, HR: 72, RR: 20, oxygen: 98% on 4L [place] Pertinent labs: potassium: 5.3, troponin: 38-->39, BNP: 409 CXR: chronic cardiomegaly with indistinct pulmonary vasculature suspicious for acute interstitial edema. CTA: no PE. Given albuterol neb, unasyn, '60mg'$  of lasix, mag sulfate and solumedrol in ER. Cardiology consulted and we were asked to admit.   Review of Systems: As per HPI;  otherwise review of systems reviewed and negative.   Ambulatory Status:  Ambulates with walker    Past Medical History:  Diagnosis Date   Asthma    Atrial fibrillation ([place])    High cholesterol    HTN (hypertension)    Respiratory failure with hypoxia [facility])     Past Surgical History:  Procedure Laterality Date   CARDIOVERSION N/A 09/13/2020   Procedure: CARDIOVERSION;  Surgeon: [name], MD;  Location: [facility] ENDOSCOPY;  Service: Cardiovascular;  Laterality: N/A;   ESOPHAGOGASTRODUODENOSCOPY N/A 08/05/2020   Procedure: ESOPHAGOGASTRODUODENOSCOPY (EGD);  Surgeon: Otis Brace, MD;  Location: [facility] ENDOSCOPY;  Service: Gastroenterology;  Laterality: N/A;    Social History   Socioeconomic History   Marital status: Divorced    Spouse name: Not on file   Number of children: Not on file   Years of education: Not on file   Highest education level: Not on file  Occupational History   Not on file  Tobacco Use   Smoking status: Former    Types: Cigarettes   Smokeless tobacco: Never   Tobacco comments:    Quit remotely 1970s  Substance and Sexual Activity   Alcohol use: Not Currently   Drug use: Not on file   Sexual activity: Not on file  Other Topics Concern   Not on file  Social History Narrative   Not on file   Social Determinants of Health   Financial Resource Strain: Not on file  Food Insecurity: Not on file  Transportation Needs: Not on file  Physical Activity: Not on  file  Stress: Not on file  Social Connections: Not on file  Intimate Partner Violence: Not on file    Allergies  Allergen Reactions   Acetaminophen-Codeine Other (See Comments)    Agitation   Codeine Other (See Comments)    hallucinations    Family History  Problem Relation Age of Onset   Breast cancer Sister    Hypertension Other     Prior to Admission medications   Medication Sig Start Date End Date Taking? Authorizing Provider  albuterol (VENTOLIN HFA) 108 (90 Base) MCG/ACT  inhaler Inhale 1-2 puffs into the lungs every 6 (six) hours as needed for wheezing or shortness of breath. 02/17/20  Yes Freddi Starr, MD  alendronate (FOSAMAX) 70 MG tablet Take 70 mg by mouth every Saturday. 02/10/20  Yes [provider]  apixaban (ELIQUIS) 5 MG TABS tablet Take 5 mg by mouth 2 (two) times daily.   Yes [provider]  atorvastatin (LIPITOR) 20 MG tablet Take 20 mg by mouth every evening. 02/12/20  Yes [provider]  benzonatate (TESSALON) 200 MG capsule Take 200 mg by mouth daily as needed for cough.   Yes [provider]  Calcium-Magnesium-Vitamin D (CALCIUM 1200+D3 PO) Take 1 tablet by mouth in the morning.   Yes [provider]  DEMADEX 20 MG tablet Take 2 tablets (40 mg) by mouth daily every morning. Take an additional 2 tablets (40 mg) as needed for weight gain of 2 lb overnight or 5 lb in one week. 08/24/20  Yes Loel Dubonnet, NP  dextromethorphan-guaiFENesin (MUCINEX DM) 30-600 MG 12hr tablet Take 2 tablets by mouth 2 (two) times daily.   Yes [provider]  ipratropium-albuterol (DUONEB) 0.5-2.5 (3) MG/3ML SOLN Inhale 3 mLs into the lungs every 6 (six) hours as needed (wheezing).   Yes [provider]  LORazepam (ATIVAN) 1 MG tablet Take 1 mg by mouth 2 (two) times daily. 02/12/20  Yes [provider]  metoprolol succinate (TOPROL-XL) 50 MG 24 hr tablet Take 50 mg by mouth 2 (two) times daily. 02/10/20  Yes [provider]  mirtazapine (REMERON) 45 MG tablet Take 45 mg by mouth at bedtime. 02/10/20  Yes [provider]  Multiple Vitamin (MULTIVITAMIN WITH MINERALS) TABS tablet Take 1 tablet by mouth in the morning.   Yes [provider]  OXYGEN Inhale 2 L into the lungs daily as needed (shortness of breath or activity level).   Yes [provider]  pantoprazole (PROTONIX) 20 MG tablet Take 1 tablet (20 mg total) by mouth daily. 08/24/20  Yes Loel Dubonnet, NP   potassium chloride (KLOR-CON) 10 MEQ tablet Take 2 tablets (20 mEq total) by mouth daily. 08/24/20 02/20/21 Yes Loel Dubonnet, NP  sertraline (ZOLOFT) 100 MG tablet Take 100 mg by mouth at bedtime. 02/10/20  Yes [provider]    Physical Exam: Vitals:   09/14/20 1545 09/14/20 1550 09/14/20 1604 09/14/20 1645  BP:   112/66 100/67  Pulse:   81 74  Resp:   20 (!) 25  Temp:   98.8 F (37.1 C)   TempSrc:      SpO2: (!) 84% 95% 93% 90%  Weight:      Height:         General:  Appears calm and comfortable and is in NAD. Mildly dyspneic  Eyes:  PERRL, EOMI, normal lids, iris ENT:  grossly normal hearing, lips & tongue, dry lips; appropriate dentition Neck:  no LAD, masses  or thyromegaly; no carotid bruits Cardiovascular:  RRR, no m/r/g. Trace bilateral edema.  Respiratory:   lungs tight with decresed air movement throughout. Bibasilar crackles. Minimal expiratory faint wheezes.  Abdomen:  soft, NT, ND, NABS Back:   normal alignment, no CVAT Skin:  no rash or induration seen on limited exam Musculoskeletal:  grossly normal tone BUE/BLE, good ROM, no bony abnormality Lower extremity:    Limited foot exam with no ulcerations.  2+ distal pulses. Psychiatric:  grossly normal mood and affect, speech fluent and appropriate, AOx3 Neurologic:  CN 2-12 grossly intact, moves all extremities in coordinated fashion, sensation intact    Radiological Exams on Admission: Independently reviewed - see discussion in A/P where applicable  CT Angio Chest PE W/Cm &/Or Wo Cm  Result Date: 09/14/2020 CLINICAL DATA:  AFib cardioversion yesterday. Shortness of breath. Pulmonary embolus suspected. EXAM: CT ANGIOGRAPHY CHEST WITH CONTRAST TECHNIQUE: Multidetector CT imaging of the chest was performed using the standard protocol during bolus administration of intravenous contrast. Multiplanar CT image reconstructions and MIPs were obtained to evaluate the vascular anatomy. CONTRAST:  54m OMNIPAQUE  IOHEXOL 350 MG/ML SOLN COMPARISON:  08/02/2020 FINDINGS: Cardiovascular: Heart is enlarged. Mild atherosclerotic calcification is noted in the wall of the thoracic aorta. No large central pulmonary embolus. Peripheral segmental and subsegmental pulmonary arteries are not well evaluated, specially in the lower lobes due to collapse/consolidation with superimposed motion artifact. Mediastinum/Nodes: No mediastinal lymphadenopathy. There is no hilar lymphadenopathy. Esophagus mildly patulous There is no axillary lymphadenopathy. Lungs/Pleura: Diffuse ground-glass opacities seen in both lungs with bibasilar collapse/consolidative disease and small bilateral pleural effusions Upper Abdomen: Benign-appearing calcified lesions in the spleen may be granulomatous. Musculoskeletal: No worrisome lytic or sclerotic osseous abnormality. Multilevel thoracic compression deformity is stable in the interval. Review of the MIP images confirms the above findings. IMPRESSION: 1. No large central pulmonary embolus. Peripheral segmental and subsegmental pulmonary arteries are not well evaluated due to collapse/consolidative disease with superimposed motion artifact. 2. Diffuse ground-glass opacities in both lungs, potentially related to pulmonary edema. 3. Bibasilar collapse/consolidative disease and small bilateral pleural effusions. Pneumonia not excluded. 4. Aortic Atherosclerosis (ICD10-I70.0). Electronically Signed   By: EMisty StanleyM.D.   On: 09/14/2020 12:52   DG Chest Portable 1 View  Result Date: 09/14/2020 CLINICAL DATA:  80year old female with shortness of breath. Cardioversion yesterday. EXAM: PORTABLE CHEST 1 VIEW COMPARISON:  CTA chest 08/02/2020 and earlier. FINDINGS: Portable AP semi upright view at 1109 hours. Stable cardiomegaly and mediastinal contours. Stable lung volumes. Small right pleural effusion appears stable. But left lung base opacification is increased, and pulmonary vasculature now appears  indistinct. Visualized tracheal air column is within normal limits. No pneumothorax. No air bronchograms. IMPRESSION: 1. Chronic cardiomegaly with indistinct pulmonary vasculature suspicious for acute interstitial edema. 2. Chronic pleural effusions, although increased left lung base opacification since last month which might be due to increased diffusion and/or atelectasis. Electronically Signed   By: HGenevie AnnM.D.   On: 09/14/2020 11:37    EKG: Independently reviewed.  NSR with rate 87, PAC, prolonged PR and QT; nonspecific ST changes with no evidence of acute ischemia. QT prolongation new    Labs on Admission: I have personally reviewed the available labs and imaging studies at the time of the admission.  Pertinent labs:  potassium: 5.3,  troponin: 38-->39,  BNP: 409  Assessment/Plan Principal Problem:   Acute on chronic respiratory failure with hypoxia ([facility] -80year old female presenting with acute on chronic respiratory failure with hypoxia likely  sick secondary to multifactorial comorbidities including asthma exacerbation and acute on chronic diastolic CHF exacerbation. -Baseline oxygen is 2 L via nasal cannula requiring 3 to 4 L here -will treat underlying comorbidities that could be exacerbating this and wean as tolerated back to her baseline. -CTA negative for PE   Active Problems:   Acute exacerbation of moderate persistent extrinsic asthma -Continue IV steroids -Scheduled DuoNebs and as needed SABA -is supposed to be on trelegy ellipta daily stopped taking this in October of last year due to cost per her daughter   -will start her back on this daily and discuss cost/compliance.   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  Not convinced she has any pneumonia at this point.  -had recent w/u for dysphagia with EGD findings consistent with achalasia. Soft diet  ordered.  -Checking procalcitonin and follow her clinically -did Not continue antibiotics can have day team tailor as indicated    Acute on chronic diastolic CHF (congestive heart failure) ([place]) -based off findings on clinical exam including worsening shortness of breath, crackles in lungs, hypoxia, findings on CXR, elevated BNP confirms that patient is in acute on chronic diastolic CHF exacerbation -recent echo 07/2020: EF of 60 to 65% with normal left ventricular function.  Diastolic function could not be evaluated. -strict I/O and daily weights -IV diuresis. holding home Demadex. received 60 mg IV Lasix today.  We will see how she responds and adjust Lasix accordingly -continued home potassium for tomorrow  -check magnesium and follow electrolytes -236 dry weight   -Telemetry    Elevated troponin Likely demand ischemia in the setting of CHF exacerbation -Trended troponin is flat -Not endorsing chest pain -Continue telemetry and monitor    AF (paroxysmal atrial fibrillation) ([facility]) s/p cardioversion 09/14/20 -Status post cardioversion yesterday -In sinus rhythm continue telemetry and home medication Eliquis -Blood pressure quite soft holding Toprol dose tonight.     Hyperkalemia  -We will repeat after all the nebs she received likely back to normal -Continue to follow -home potassium ordered to start tomorrow.   Anxiety Continue zoloft, remeron and ativan   Qt prolongation -telemetry -avoid qt prolonging meds -follow electrolytes  Body mass index is 31.45 kg/m.    Level of care: Telemetry Medical DVT prophylaxis:  eliquis  Code Status:  Full - confirmed with patient/family Family Communication: daughter at bedside: Noreene Filbert  Disposition Plan:  The patient is from: [place]   Anticipated d/c is to: [place] inpatient hospitalization and is at significant risk of worsening, requires constant monitoring, assessment and MDM with specialists.   Patient is currently: acutely ill Consults called: none   Admission status:  observation    Orma Flaming MD [facility]   How to contact the [facility] Attending or Consulting provider [place] or covering provider during after hours [place], for this patient?  Check the care team in [facility] and look for a) attending/consulting [facility] provider listed and b) the [facility] team listed Log into [email or web address] and use [place]'s universal password to access. If you do not have the password, please contact the hospital operator. Locate the [facility] provider you are looking for under [facility] and page to a number that you can be directly reached. If you still have difficulty reaching the provider, please page the [facility] (Director on Call) for the Hospitalists listed on amion for assistance.  09/14/2020, 4:54 PM

## 2020-09-14 NOTE — Telephone Encounter (Signed)
Pt c/o Shortness Of Breath: STAT if SOB developed within the last 24 hours or pt is noticeably SOB on the phone  1. Are you currently SOB (can you hear that pt is SOB on the phone)? Not currently with the patient, spoke with patient a couple minutes ago and she was out of breathe on the phone  2. How long have you been experiencing SOB? Started last night  3. Are you SOB when sitting or when up moving around? both  4. Are you currently experiencing any other symptoms? Vomited last night  Patient's daughter states the patient had a cardioversion yesterday morning. She states the patient woke up last night at 1 am and called her stating she "did not feel right". She states she spoke with the patient on the phone again a few minutes ago and she was out of breathe while speaking. She states she also did throw up last night. She says the patient has a sore chest from the procedure, but it is not chest pain. She states the patient's oxygen is 88%, but it's usually 95%. She also says she was supposed to receive a call from the doctor about how the patient's procedure went, but hasn't. She also says the patient was told not to take torsemide prior to the procedure so she is not sure if her SOB was because of skipping the water pill. She says the patient is at a living facility and her number is: [phone], Patient's daughter's number: [phone]

## 2020-09-14 NOTE — Telephone Encounter (Signed)
Daughter states pt called her at 1am stating "I just don't feel right".  She had pt get out of bed and sit up in her chair.  O2 88% and very SOB.   She called pt back at 2:30am and she was still SOB and O2 at 88%.  Spoke with pt this morning and symptoms continue.  Pt unable to finish sentences without having to stop and take a breath.  Advised daughter pt needs to go to the ED for evaluation as this is concerning for a PE.  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  She also inquired about f/u appt for pt post DCCV.  Advised I will send message to Dr. Debara Pickett and his nurse to make them aware of current situation and then we would see about follow up appointment once we know what is going on from this visit.  Daughter appreciative for call.

## 2020-09-14 NOTE — ED Provider Notes (Signed)
[facility] EMERGENCY DEPARTMENT Provider Note   CSN: [id number] Arrival date & time: 09/14/20  1049    History Chief Complaint  Patient presents with   Shortness of Breath    Derrian Belshe is a 80 y.o. female with past medical history significant for A. fib on anticoagulation, chronic respiratory failure on 2 L via nasal cannula, hypertension, CHF who presents for evaluation of shortness of breath.  Began yesterday.  Seen by cardiology and had cardioversion outpatient.  She states she has not missed any doses of her anticoagulant.  She did have to hold her diuretic prior to procedure.  Patient and daughter states her lower extremity swelling is at her baseline.  Apparently called her daughter in the middle of the night for increased shortness of breath.  She had had an episode of NBNB emesis at that time.  She admits to generalized chest tightness.  No back pain.  No hemoptysis.  States she did have to sit up in a recliner due to shortness of breath last night. Apparently supposed to be doing nebulizer at home however patient and family not sure of why. Duaghter states history of "wheeze" Called cardiology today who told her to come to the emergency department to rule out PE.  Apparently was hypoxic with EMS however per nursing patient's nasal cannula was occluded at end.  On arrival here she is on her home 2 L liters of oxygen however is not moving very much air.  She denies any headache, lightheadedness, dizziness, Abdominal pain, diarrhea or dysuria.  Denies additional aggravating or alleviating factors  History obtained from patient and past medical records.  No interpreter is used  Wheeze/asthma (not recorded in chart) No official diagnosis of COPD however daughter states PCP has told patient to do nebulizers, albuterol inhaler at home. They are not sure why she is on home oxygen.This began after last hospitalization.   HPI     Past Medical History:  Diagnosis Date    Asthma    Atrial fibrillation (HCC)    High cholesterol    HTN (hypertension)    Respiratory failure with hypoxia [facility])    Patient Active Problem List   Diagnosis Date Noted   Persistent atrial fibrillation ([place])    Achalasia of esophagus [id number]   Acute diastolic CHF (congestive heart failure) ([place]) 08/03/2020   Acute on chronic respiratory failure with hypoxia ([place]) 08/02/2020   Hyponatremia 08/02/2020   Hypokalemia 08/02/2020   Dysphagia 08/02/2020   Asthma 08/02/2020   AF (paroxysmal atrial fibrillation) ([place]) 08/02/2020   Chronic anticoagulation 08/02/2020   Acquired thrombophilia ([place]) 08/02/2020    Past Surgical History:  Procedure Laterality Date   CARDIOVERSION N/A 09/13/2020   Procedure: CARDIOVERSION;  Surgeon: [name], MD;  Location[facility] ENDOSCOPY;  Service: Cardiovascular;  Laterality: N/A;   ESOPHAGOGASTRODUODENOSCOPY N/A 08/05/2020   Procedure: ESOPHAGOGASTRODUODENOSCOPY (EGD);  Surgeon: Otis Brace, MD;  Location: [facility] ENDOSCOPY;  Service: Gastroenterology;  Laterality: N/A;     OB History   No obstetric history on file.     Family History  Problem Relation Age of Onset   Breast cancer Sister    Hypertension Other     Social History   Tobacco Use   Smoking status: Former    Types: Cigarettes   Smokeless tobacco: Never   Tobacco comments:    Quit remotely 1970s  Substance Use Topics   Alcohol use: Not Currently    Home Medications Prior to Admission medications   Medication  Sig Start Date End Date Taking? Authorizing Provider  albuterol (VENTOLIN HFA) 108 (90 Base) MCG/ACT inhaler Inhale 1-2 puffs into the lungs every 6 (six) hours as needed for wheezing or shortness of breath. 02/17/20  Yes Freddi Starr, MD  alendronate (FOSAMAX) 70 MG tablet Take 70 mg by mouth every Saturday. 02/10/20  Yes [provider]  apixaban (ELIQUIS) 5 MG TABS tablet Take 5 mg by mouth 2 (two) times daily.   Yes [provider]   atorvastatin (LIPITOR) 20 MG tablet Take 20 mg by mouth every evening. 02/12/20  Yes [provider]  benzonatate (TESSALON) 200 MG capsule Take 200 mg by mouth daily as needed for cough.   Yes [provider]  Calcium-Magnesium-Vitamin D (CALCIUM 1200+D3 PO) Take 1 tablet by mouth in the morning.   Yes [provider]  DEMADEX 20 MG tablet Take 2 tablets (40 mg) by mouth daily every morning. Take an additional 2 tablets (40 mg) as needed for weight gain of 2 lb overnight or 5 lb in one week. 08/24/20  Yes Loel Dubonnet, NP  dextromethorphan-guaiFENesin (MUCINEX DM) 30-600 MG 12hr tablet Take 2 tablets by mouth 2 (two) times daily.   Yes [provider]  ipratropium-albuterol (DUONEB) 0.5-2.5 (3) MG/3ML SOLN Inhale 3 mLs into the lungs every 6 (six) hours as needed (wheezing).   Yes [provider]  LORazepam (ATIVAN) 1 MG tablet Take 1 mg by mouth 2 (two) times daily. 02/12/20  Yes [provider]  metoprolol succinate (TOPROL-XL) 50 MG 24 hr tablet Take 50 mg by mouth 2 (two) times daily. 02/10/20  Yes [provider]  mirtazapine (REMERON) 45 MG tablet Take 45 mg by mouth at bedtime. 02/10/20  Yes [provider]  Multiple Vitamin (MULTIVITAMIN WITH MINERALS) TABS tablet Take 1 tablet by mouth in the morning.   Yes [provider]  OXYGEN Inhale 2 L into the lungs daily as needed (shortness of breath or activity level).   Yes [provider]  pantoprazole (PROTONIX) 20 MG tablet Take 1 tablet (20 mg total) by mouth daily. 08/24/20  Yes Loel Dubonnet, NP  potassium chloride (KLOR-CON) 10 MEQ tablet Take 2 tablets (20 mEq total) by mouth daily. 08/24/20 02/20/21 Yes Loel Dubonnet, NP  sertraline (ZOLOFT) 100 MG tablet Take 100 mg by mouth at bedtime. 02/10/20  Yes [provider]    Allergies    Acetaminophen-codeine and Codeine  Review of Systems   Review of Systems  Constitutional: Negative.    HENT: Negative.    Respiratory:  Positive for cough, shortness of breath and wheezing. Negative for apnea, choking, chest tightness and stridor.   Cardiovascular: Negative.   Gastrointestinal: Negative.   Genitourinary: Negative.   Musculoskeletal: Negative.   Skin: Negative.   Neurological: Negative.   All other systems reviewed and are negative.  Physical Exam Updated Vital Signs BP 112/66   Pulse 81   Temp 98.8 F (37.1 C)   Resp 20   Ht '5\' 9"'$  (1.753 m)   Wt 96.6 kg   SpO2 93%   BMI 31.45 kg/m   Physical Exam Vitals and nursing note reviewed.  Constitutional:      General: She is not in acute distress.    Appearance: She is well-developed. She is obese. She is not ill-appearing (Chronically ill appearing), toxic-appearing or diaphoretic.  HENT:     Head: Normocephalic and atraumatic.     Mouth/Throat:     Mouth: Mucous  membranes are moist.  Eyes:     Pupils: Pupils are equal, round, and reactive to light.  Cardiovascular:     Rate and Rhythm: Normal rate.     Pulses: Normal pulses.          Radial pulses are 2+ on the right side and 2+ on the left side.       Dorsalis pedis pulses are 2+ on the right side and 2+ on the left side.     Heart sounds: Normal heart sounds.  Pulmonary:     Effort: Tachypnea, accessory muscle usage and respiratory distress present.     Breath sounds: Wheezing present.  Chest:     Comments: Equal rise and fall to chest wall Abdominal:     General: Bowel sounds are normal. There is no distension.     Palpations: Abdomen is soft.     Comments: Soft, nontender without rebound or guarding  Musculoskeletal:        General: Normal range of motion.     Cervical back: Normal range of motion.     Right lower leg: No tenderness. Edema present.     Left lower leg: No tenderness. Edema present.     Comments: 1+ pitting edema to bilateral lower extremities  Skin:    General: Skin is warm and dry.     Capillary Refill: Capillary refill takes  less than 2 seconds.     Comments: No rashes or lesions  Neurological:     General: No focal deficit present.     Mental Status: She is alert.  Psychiatric:        Mood and Affect: Mood normal.    ED Results / Procedures / Treatments   Labs (all labs ordered are listed, but only abnormal results are displayed) Labs Reviewed  CBC WITH DIFFERENTIAL/PLATELET - Abnormal; Notable for the following components:      Result Value   MCV 101.0 (*)    Neutro Abs 9.4 (*)    Lymphs Abs 0.5 (*)    All other components within normal limits  BASIC METABOLIC PANEL - Abnormal; Notable for the following components:   Potassium 5.3 (*)    Glucose, Bld 121 (*)    All other components within normal limits  BRAIN NATRIURETIC PEPTIDE - Abnormal; Notable for the following components:   B Natriuretic Peptide 409.5 (*)    All other components within normal limits  TROPONIN I (HIGH SENSITIVITY) - Abnormal; Notable for the following components:   Troponin I (High Sensitivity) 38 (*)    All other components within normal limits  TROPONIN I (HIGH SENSITIVITY) - Abnormal; Notable for the following components:   Troponin I (High Sensitivity) 39 (*)    All other components within normal limits  RESP PANEL BY RT-PCR (FLU A&B, COVID) [id number]    EKG EKG Interpretation  Date/Time:  Tuesday September 14 2020 14:05:04 EDT Ventricular Rate:  87 PR Interval:  233 QRS Duration: 92 QT Interval:  420 QTC Calculation: 506 R Axis:   -64 Text Interpretation: Sinus rhythm Atrial premature complex Prolonged PR interval Left atrial enlargement Left anterior fascicular block Low voltage, precordial leads RSR' in V1 or V2, right VCD or RVH Borderline T abnormalities, diffuse leads Prolonged QT interval No significant change since last tracing Confirmed by Aletta Edouard [phone]) on 09/14/2020 2:10:19 PM  Radiology CT Angio Chest PE W/Cm &/Or Wo Cm  Result Date: 09/14/2020 CLINICAL DATA:  AFib cardioversion yesterday.  Shortness of breath. Pulmonary embolus suspected.  EXAM: CT ANGIOGRAPHY CHEST WITH CONTRAST TECHNIQUE: Multidetector CT imaging of the chest was performed using the standard protocol during bolus administration of intravenous contrast. Multiplanar CT image reconstructions and MIPs were obtained to evaluate the vascular anatomy. CONTRAST:  37m OMNIPAQUE IOHEXOL 350 MG/ML SOLN COMPARISON:  08/02/2020 FINDINGS: Cardiovascular: Heart is enlarged. Mild atherosclerotic calcification is noted in the wall of the thoracic aorta. No large central pulmonary embolus. Peripheral segmental and subsegmental pulmonary arteries are not well evaluated, specially in the lower lobes due to collapse/consolidation with superimposed motion artifact. Mediastinum/Nodes: No mediastinal lymphadenopathy. There is no hilar lymphadenopathy. Esophagus mildly patulous There is no axillary lymphadenopathy. Lungs/Pleura: Diffuse ground-glass opacities seen in both lungs with bibasilar collapse/consolidative disease and small bilateral pleural effusions Upper Abdomen: Benign-appearing calcified lesions in the spleen may be granulomatous. Musculoskeletal: No worrisome lytic or sclerotic osseous abnormality. Multilevel thoracic compression deformity is stable in the interval. Review of the MIP images confirms the above findings. IMPRESSION: 1. No large central pulmonary embolus. Peripheral segmental and subsegmental pulmonary arteries are not well evaluated due to collapse/consolidative disease with superimposed motion artifact. 2. Diffuse ground-glass opacities in both lungs, potentially related to pulmonary edema. 3. Bibasilar collapse/consolidative disease and small bilateral pleural effusions. Pneumonia not excluded. 4. Aortic Atherosclerosis (ICD10-I70.0). Electronically Signed   By: EMisty StanleyM.D.   On: 09/14/2020 12:52   DG Chest Portable 1 View  Result Date: 09/14/2020 CLINICAL DATA:  80year old female with shortness of breath.  Cardioversion yesterday. EXAM: PORTABLE CHEST 1 VIEW COMPARISON:  CTA chest 08/02/2020 and earlier. FINDINGS: Portable AP semi upright view at 1109 hours. Stable cardiomegaly and mediastinal contours. Stable lung volumes. Small right pleural effusion appears stable. But left lung base opacification is increased, and pulmonary vasculature now appears indistinct. Visualized tracheal air column is within normal limits. No pneumothorax. No air bronchograms. IMPRESSION: 1. Chronic cardiomegaly with indistinct pulmonary vasculature suspicious for acute interstitial edema. 2. Chronic pleural effusions, although increased left lung base opacification since last month which might be due to increased diffusion and/or atelectasis. Electronically Signed   By: HGenevie AnnM.D.   On: 09/14/2020 11:37    Procedures .Critical Care  Date/Time: 09/14/2020 3:53 PM Performed by: HNettie Elm [name] Authorized by: HNettie Elm [name]   Critical care provider statement:    Critical care time (minutes):  45   Critical care was necessary to treat or prevent imminent or life-threatening deterioration of the following conditions:  Respiratory failure   Critical care was time spent personally by me on the following activities:  Discussions with consultants, evaluation of patient's response to treatment, examination of patient, ordering and performing treatments and interventions, ordering and review of laboratory studies, ordering and review of radiographic studies, pulse oximetry, re-evaluation of patient's condition, obtaining history from patient or surrogate and review of old charts   Medications Ordered in ED Medications  Ampicillin-Sulbactam (UNASYN) 3 g in sodium chloride 0.9 % 100 mL IVPB (3 g Intravenous New Bag/Given 09/14/20 1603)  methylPREDNISolone sodium succinate (SOLU-MEDROL) 125 mg/2 mL injection 125 mg (125 mg Intravenous Given 09/14/20 1155)  ipratropium-albuterol (DUONEB) 0.5-2.5 (3) MG/3ML nebulizer  solution 3 mL (3 mLs Nebulization Given 09/14/20 1256)  albuterol (VENTOLIN HFA) 108 (90 Base) MCG/ACT inhaler 2 puff (2 puffs Inhalation Given 09/14/20 1138)  iohexol (OMNIPAQUE) 350 MG/ML injection 80 mL (80 mLs Intravenous Contrast Given 09/14/20 1219)  furosemide (LASIX) injection 60 mg (60 mg Intravenous Given 09/14/20 1452)  albuterol (PROVENTIL,VENTOLIN) solution continuous neb (7.5 mg/hr Nebulization Given  09/14/20 1443)  magnesium sulfate IVPB 2 g 50 mL (0 g Intravenous Stopped 09/14/20 1555)   ED Course  I have reviewed the triage vital signs and the nursing notes.  Pertinent labs & imaging results that were available during my care of the patient were reviewed by me and considered in my medical decision making (see chart for details).  Here for evaluation of shortness of breath.  History significant for recent cardioversion yesterday.  She is chronically anticoagulated.  2 L via oxygen nasal cannula for chronic hypoxic respiratory failure due to what sounds like wheeze however family is unsure why on chronic oxygen. Not doing nebs at home per daughter however does have machine.  She does have 1+ pitting edema to lower extremities however daughter patient states this is at baseline. Weight up and down? Possible 10 pound weight gait over last few days however family thinks scale is off. Apparently hypoxic with EMS requiring nonrebreather however on arrival here patient was placed on her home oxygen of 2 L without any hypoxia.  She does have very decreased movement however does have minimal expiratory wheeze.  Labs and imaging personally reviewed and interpreted:  CBC no leukocytosis, hemoglobin similar to prior CMP potassium 5.3, glucose 121 no additional electrolyte, renal or liver abnormality Trop 38>>>39 Flat>> up from prior however with recent cardioversion, likely demand. She denies ay current CP DG chest chronic cardiomegaly, effusions, possible edema COVID/ Flu neg CTA chest negative  for PE, diffuse groundglass opacities possible edema however cannot rule out pneumonia  Patient reassessed.  Apparently had desaturation into the mid 80s however daughter had patient take some deep breaths which brought her back to the low 90s.  She still significantly decreased breath sounds.  Discussed labs and imaging.  BNP mildly elevated, given CT chest.  Will treat with some Lasix to cover for possible pulmonary edema however given her emesis yesterday we will add on Unasyn to cover for aspiration pneumonia. Also give continuous neb and magnesium for continuous wheeze, suspect shortness of breath likely multifactorial low suspicion for cardiogenic shock at this time.  Patient reassessed. Has had duoneb, mag, multiple breathing treatments. Now hypoxic on home oxygen, placed on 4 L [place]. Comfortable on this.  Will admit for acute hypoxic respiratory failure, likely multifactorial.   Oncoming provider to consult with cardiology, medicine for admission  The patient appears reasonably stabilized for admission considering the current resources, flow, and capabilities available in the ED at this time, and I doubt any other [facility] requiring further screening and/or treatment in the ED prior to admission.   Clinical Course as of 09/14/20 1617  Tue Sep 14, 2020  1120 Female here with increased shortness of breath since last night.  She said she had to sleep in the recliner.  Little bit of chest discomfort.  She just underwent electrical cardioversion yesterday for A. fib.  Currently in sinus.  Is on home oxygen at baseline.  Getting labs chest x-ray EKG.  Disposition per results of testing. [MB]  [id number] DG Chest Portable 1 View Chronic cardiomegaly, suspicion for interstitial edema, chronic pleural effusions [BH]    Clinical Course User Index [BH] Deborahann Poteat A, [name] [MB] Hayden Rasmussen, MD    MDM Rules/Calculators/A&P                            Final Clinical Impression(s) / ED  Diagnoses Final diagnoses:  Acute on chronic congestive heart failure, unspecified  heart failure type ([place])  Wheeze  Acute on chronic respiratory failure with hypoxia (HCC)  Elevated troponin    Rx / DC Orders ED Discharge Orders     None        Nevena Rozenberg A, [name] 09/14/20 1617    Hayden Rasmussen, MD 09/14/20 [phone]

## 2020-09-15 DIAGNOSIS — I480 Paroxysmal atrial fibrillation: Secondary | ICD-10-CM

## 2020-09-15 DIAGNOSIS — E875 Hyperkalemia: Secondary | ICD-10-CM

## 2020-09-15 DIAGNOSIS — J4541 Moderate persistent asthma with (acute) exacerbation: Secondary | ICD-10-CM | POA: Diagnosis not present

## 2020-09-15 DIAGNOSIS — R778 Other specified abnormalities of plasma proteins: Secondary | ICD-10-CM

## 2020-09-15 DIAGNOSIS — J9621 Acute and chronic respiratory failure with hypoxia: Secondary | ICD-10-CM | POA: Diagnosis not present

## 2020-09-15 DIAGNOSIS — I5033 Acute on chronic diastolic (congestive) heart failure: Secondary | ICD-10-CM | POA: Diagnosis not present

## 2020-09-15 LAB — PROCALCITONIN: Procalcitonin: <0.1 ng/mL

## 2020-09-15 LAB — CBC
HCT: 35.8 % — ABNORMAL LOW (ref 36.0–46.0)
Hemoglobin: 11.9 g/dL — ABNORMAL LOW (ref 12.0–15.0)
MCH: 32.4 pg (ref 26.0–34.0)
MCHC: 33.2 g/dL (ref 30.0–36.0)
MCV: 97.5 fL (ref 80.0–100.0)
Platelets: 236 K/uL (ref 150–400)
RBC: 3.67 MIL/uL — ABNORMAL LOW (ref 3.87–5.11)
RDW: 13.3 % (ref 11.5–15.5)
WBC: 10.3 K/uL (ref 4.0–10.5)
nRBC: 0 % (ref 0.0–0.2)

## 2020-09-15 LAB — BASIC METABOLIC PANEL WITH GFR
Anion gap: 6 (ref 5–15)
BUN: 13 mg/dL (ref 8–23)
CO2: 35 mmol/L — ABNORMAL HIGH (ref 22–32)
Calcium: 8.9 mg/dL (ref 8.9–10.3)
Chloride: 99 mmol/L (ref 98–111)
Creatinine, Ser: 0.85 mg/dL (ref 0.44–1.00)
GFR, Estimated: >60 mL/min
Glucose, Bld: 131 mg/dL — ABNORMAL HIGH (ref 70–99)
Potassium: 4 mmol/L (ref 3.5–5.1)
Sodium: 140 mmol/L (ref 135–145)

## 2020-09-15 MED ORDER — ORAL CARE MOUTH RINSE
15.0000 mL | Freq: Two times a day (BID) | OROMUCOSAL | Status: DC
  Administered 2020-09-15 – 2020-09-16 (×4): 15 mL via OROMUCOSAL

## 2020-09-15 MED ORDER — FUROSEMIDE 10 MG/ML IJ SOLN
40.0000 mg | Freq: Two times a day (BID) | INTRAMUSCULAR | Status: DC
  Administered 2020-09-15 – 2020-09-17 (×4): 40 mg via INTRAVENOUS
  Filled 2020-09-15 (×4): qty 4

## 2020-09-15 MED ORDER — METHYLPREDNISOLONE SODIUM SUCC 125 MG IJ SOLR
60.0000 mg | Freq: Every day | INTRAMUSCULAR | Status: DC
  Administered 2020-09-16 – 2020-09-17 (×2): 60 mg via INTRAVENOUS
  Filled 2020-09-15 (×2): qty 2

## 2020-09-15 MED ORDER — GUAIFENESIN-DM 100-10 MG/5ML PO SYRP
5.0000 mL | ORAL_SOLUTION | ORAL | Status: DC | PRN
  Administered 2020-09-15 – 2020-09-16 (×3): 5 mL via ORAL
  Filled 2020-09-15 (×2): qty 5

## 2020-09-15 NOTE — Telephone Encounter (Signed)
Patient currently admitted

## 2020-09-15 NOTE — Progress Notes (Signed)
Possible PROGRESS NOTE  Lindsay [name] [id number] DOB: [birth year] DOA: 09/14/2020 PCP: Bernerd Limbo, MD   LOS: 0 days   Brief narrative:  Lindsay [name] is a 80 y.o. female with history of chronic respiratory failure on home oxygen, asthma, kyphosis with restrictive lung disease, hypertension, hyperlipidemia, atrial fibrillation on Eliquis status post cardioversion on 09/13/2020 presented to hospital with worsening shortness of breath with hypoxia and was unable to complete sentences.  Patient does have chronic cough which is unchanged.  In the ED patient was noted to have a 4 L of oxygen by nasal cannula saturating 98% and chest x-ray showed cardiomegaly with pulmonary vascular congestion.  CTA of the chest showed no pulmonary embolism.  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  Currently with mild exacerbation.  Patient is on 4 L of oxygen currently and usually on 2 L of oxygen by nasal cannula. CTA was negative for pulmonary embolism.   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  No convincing case for any kind of infection.  Has been discontinued.  Patient had history of dysphagia and EGD showed consistency with achalasia.  Currently on soft diet.  Procalcitonin less than 0.10    Acute on chronic diastolic CHF Continue IV diuresis.  Recent echo from 07/2020 showed LV ejection fraction of 60 to 65% with normal LV function.   Received IV Lasix 60 yesterday.  Will restart 40 mg IV twice daily for now.  Continue to monitor closely.  continue intake and output charting.  Has positive balance.       Elevated troponin Likely demand ischemia in the setting of CHF exacerbation.  Troponin flat.  Paroxysmal atrial fibrillation s/p cardioversion 09/14/20 -Status post cardioversion on 09/14/2020.  Continue Eliquis.  Metoprolol on hold due to soft blood pressure.   Hyperkalemia Resolved.   Anxiety Continue zoloft, remeron and ativan   Debility and deconditioning.  Will get PT evaluation.  Patient uses walker at home.   DVT prophylaxis:  apixaban (ELIQUIS) tablet 5 mg    Code Status: Full code  Family Communication: None today.  Status is: Observation  The patient will require care spanning > 2 midnights and should be moved to inpatient because: Unsafe d/c plan, IV treatments appropriate due to intensity of illness or inability to take PO, and Inpatient level of care appropriate due to severity of illness  Dispo: The patient is from: SNF              Anticipated d/c is to: SNF              Patient currently is not medically stable to d/c.   Difficult to place patient No  Consultants: None  Procedures: None  Anti-infectives:  None  Anti-infectives (From admission, onward)    Start     Dose/Rate Route Frequency Ordered Stop   09/14/20 1345  Ampicillin-Sulbactam (UNASYN) 3 g in sodium chloride 0.9 % 100 mL IVPB  3 g 200 mL/hr over 30 Minutes Intravenous  Once 09/14/20 1337 09/14/20 1648      Subjective: Today, patient was seen and examined at bedside.  Patient states that she does not feel well and persists to have dyspnea on exertion and shortness of breath  Objective: Vitals:   09/15/20 0734 09/15/20 1146  BP:  112/79  Pulse:  66  Resp:  17  Temp:  97.6 F (36.4 C)  SpO2: 93% 93%    Intake/Output Summary (Last 24 hours) at 09/15/2020 1356 Last data filed at 09/15/2020 0809 Gross per  24 hour  Intake 378.63 ml  Output 500 ml  Net -121.37 ml   Filed Weights   09/14/20 1056 09/15/20 0345  Weight: 96.6 kg 109.1 kg   Body mass index is 35.52 kg/m.   Physical Exam: GENERAL: Patient is alert awake and oriented. Not in obvious distress.  Obese built, on nasal cannula oxygen HENT: No scleral pallor or icterus. Pupils equally reactive to light. Oral mucosa is moist NECK: is supple, no gross swelling noted. CHEST:   Diminished breath sounds bilaterally. CVS: S1 and S2 heard, no murmur. Regular rate and rhythm.  ABDOMEN: Soft, non-tender, bowel sounds are present. EXTREMITIES: Bilateral lower extremity trace edema noted CNS: Cranial nerves are intact. No focal motor deficits. SKIN: warm and dry without rashes.  Data Review: I have personally reviewed the following laboratory data and studies,  CBC: Recent Labs  Lab 09/09/20 0945 09/14/20 1137 09/15/20 0445  WBC 7.4 10.4 10.3  NEUTROABS  --  9.4*  --   HGB 13.1 13.0 11.9*  HCT 39.9 41.9 35.8*  MCV 95 101.0* 97.5  PLT 265 256 [id number]   Basic Metabolic Panel: Recent Labs  Lab 09/09/20 0945 09/14/20 1137 09/14/20 2022 09/15/20 0445  NA 141 138 138 140  K 4.0 5.3* 3.5 4.0  CL 98 100 98 99  CO2 31* 31 30 35*  GLUCOSE 89 121* 207* 131*  BUN '15 10 12 13  '$ CREATININE 0.91 0.71 1.00 0.85  CALCIUM 9.1 8.9 9.0 8.9  MG  --   --  2.3  --    Liver Function Tests: Recent Labs  Lab 09/09/20 0945  AST 18  ALT 14  ALKPHOS 89  BILITOT 0.3  PROT 6.7  ALBUMIN 3.8   No results for input(s): LIPASE, AMYLASE in the last 168 hours. No results for input(s): AMMONIA in the last 168 hours. Cardiac Enzymes: No results for input(s): CKTOTAL, CKMB, CKMBINDEX, TROPONINI in the last 168 hours. BNP (last 3 results) Recent Labs    08/02/20 1213 09/14/20 1138  BNP 305.4* 409.5*    ProBNP (last 3 results) No results for input(s): PROBNP in the last 8760 hours.  CBG: No results for input(s): GLUCAP in the last 168  hours. Recent Results (from the past 240 hour(s))  Resp Panel by RT-PCR (Flu A&B, Covid) Nasopharyngeal Swab     Status: None   Collection Time: 09/14/20 11:37 AM   Specimen: Nasopharyngeal Swab; Nasopharyngeal(NP) swabs in vial transport medium  Result Value Ref Range Status   SARS Coronavirus 2 by RT PCR NEGATIVE NEGATIVE Final    Comment: (NOTE) SARS-CoV-2 target nucleic acids are NOT DETECTED.  The SARS-CoV-2 RNA is generally detectable in upper respiratory specimens during the acute phase of infection. The lowest concentration of SARS-CoV-2 viral copies this assay can detect is 138 copies/mL. A negative result does not preclude SARS-Cov-2 infection and should not be used as the sole basis for treatment  or other patient management decisions. A negative result may occur with  improper specimen collection/handling, submission of specimen other than nasopharyngeal swab, presence of viral mutation(s) within the areas targeted by this assay, and inadequate number of viral copies(<138 copies/mL). A negative result must be combined with clinical observations, patient history, and epidemiological information. The expected result is Negative.  Fact Sheet for Patients:  EntrepreneurPulse.com.au  Fact Sheet for Healthcare Providers:  IncredibleEmployment.be  This test is no t yet approved or cleared by the [place] FDA and  has been authorized for detection and/or diagnosis of SARS-CoV-2 by FDA under an Emergency Use Authorization (EUA). This EUA will remain  in effect (meaning this test can be used) for the duration of the COVID-19 declaration under Section 564(b)(1) of the Act, 21 U.S.C.section 360bbb-3(b)(1), unless the authorization is terminated  or revoked sooner.       Influenza A by PCR NEGATIVE NEGATIVE Final   Influenza B by PCR NEGATIVE NEGATIVE Final    Comment: (NOTE) The Xpert Xpress SARS-CoV-2/FLU/RSV plus assay is intended as  an aid in the diagnosis of influenza from Nasopharyngeal swab specimens and should not be used as a sole basis for treatment. Nasal washings and aspirates are unacceptable for Xpert Xpress SARS-CoV-2/FLU/RSV testing.  Fact Sheet for Patients: EntrepreneurPulse.com.au  Fact Sheet for Healthcare Providers: IncredibleEmployment.be  This test is not yet approved or cleared by the [place] FDA and has been authorized for detection and/or diagnosis of SARS-CoV-2 by FDA under an Emergency Use Authorization (EUA). This EUA will remain in effect (meaning this test can be used) for the duration of the COVID-19 declaration under Section 564(b)(1) of the Act, 21 U.S.C. section 360bbb-3(b)(1), unless the authorization is terminated or revoked.  Performed at [facility], [place] [address], [place], [place] [address]      Studies: CT Angio Chest PE W/Cm &/Or Wo Cm  Result Date: 09/14/2020 CLINICAL DATA:  AFib cardioversion yesterday. Shortness of breath. Pulmonary embolus suspected. EXAM: CT ANGIOGRAPHY CHEST WITH CONTRAST TECHNIQUE: Multidetector CT imaging of the chest was performed using the standard protocol during bolus administration of intravenous contrast. Multiplanar CT image reconstructions and MIPs were obtained to evaluate the vascular anatomy. CONTRAST:  62m OMNIPAQUE IOHEXOL 350 MG/ML SOLN COMPARISON:  08/02/2020 FINDINGS: Cardiovascular: Heart is enlarged. Mild atherosclerotic calcification is noted in the wall of the thoracic aorta. No large central pulmonary embolus. Peripheral segmental and subsegmental pulmonary arteries are not well evaluated, specially in the lower lobes due to collapse/consolidation with superimposed motion artifact. Mediastinum/Nodes: No mediastinal lymphadenopathy. There is no hilar lymphadenopathy. Esophagus mildly patulous There is no axillary lymphadenopathy. Lungs/Pleura: Diffuse ground-glass opacities seen in both  lungs with bibasilar collapse/consolidative disease and small bilateral pleural effusions Upper Abdomen: Benign-appearing calcified lesions in the spleen may be granulomatous. Musculoskeletal: No worrisome lytic or sclerotic osseous abnormality. Multilevel thoracic compression deformity is stable in the interval. Review of the MIP images confirms the above findings. IMPRESSION: 1. No large central pulmonary embolus. Peripheral segmental and subsegmental pulmonary arteries are not well evaluated due to collapse/consolidative disease with superimposed motion artifact. 2. Diffuse ground-glass opacities in both lungs, potentially related to pulmonary edema. 3. Bibasilar collapse/consolidative disease and small bilateral pleural effusions. Pneumonia not excluded. 4. Aortic Atherosclerosis (ICD10-I70.0). Electronically Signed   By: EMisty StanleyM.D.   On: 09/14/2020 12:52   DG Chest Portable 1 View  Result Date: 09/14/2020 CLINICAL DATA:  80year old female with shortness of breath. Cardioversion yesterday. EXAM: PORTABLE CHEST 1 VIEW  COMPARISON:  CTA chest 08/02/2020 and earlier. FINDINGS: Portable AP semi upright view at 1109 hours. Stable cardiomegaly and mediastinal contours. Stable lung volumes. Small right pleural effusion appears stable. But left lung base opacification is increased, and pulmonary vasculature now appears indistinct. Visualized tracheal air column is within normal limits. No pneumothorax. No air bronchograms. IMPRESSION: 1. Chronic cardiomegaly with indistinct pulmonary vasculature suspicious for acute interstitial edema. 2. Chronic pleural effusions, although increased left lung base opacification since last month which might be due to increased diffusion and/or atelectasis. Electronically Signed   By: Genevie Ann M.D.   On: 09/14/2020 11:37      Flora Lipps, MD  [facility] 09/15/2020  If 7PM-7AM, please contact night-coverage

## 2020-09-15 NOTE — Telephone Encounter (Signed)
Thanks .. agree with the recommendation to go to the ER.  We can reschedule follow-up based on those findings. Her low oxygen levels are worrisome.  Dr. Lemmie Evens

## 2020-09-15 NOTE — Plan of Care (Signed)

## 2020-09-16 DIAGNOSIS — Z7983 Long term (current) use of bisphosphonates: Secondary | ICD-10-CM | POA: Diagnosis not present

## 2020-09-16 DIAGNOSIS — I5033 Acute on chronic diastolic (congestive) heart failure: Secondary | ICD-10-CM | POA: Diagnosis present

## 2020-09-16 DIAGNOSIS — I248 Other forms of acute ischemic heart disease: Secondary | ICD-10-CM | POA: Diagnosis present

## 2020-09-16 DIAGNOSIS — R5381 Other malaise: Secondary | ICD-10-CM | POA: Diagnosis present

## 2020-09-16 DIAGNOSIS — M419 Scoliosis, unspecified: Secondary | ICD-10-CM | POA: Diagnosis present

## 2020-09-16 DIAGNOSIS — Z79899 Other long term (current) drug therapy: Secondary | ICD-10-CM | POA: Diagnosis not present

## 2020-09-16 DIAGNOSIS — Z9981 Dependence on supplemental oxygen: Secondary | ICD-10-CM | POA: Diagnosis not present

## 2020-09-16 DIAGNOSIS — T502X5A Adverse effect of carbonic-anhydrase inhibitors, benzothiadiazides and other diuretics, initial encounter: Secondary | ICD-10-CM | POA: Diagnosis present

## 2020-09-16 DIAGNOSIS — E669 Obesity, unspecified: Secondary | ICD-10-CM | POA: Diagnosis present

## 2020-09-16 DIAGNOSIS — Z6833 Body mass index (BMI) 33.0-33.9, adult: Secondary | ICD-10-CM | POA: Diagnosis not present

## 2020-09-16 DIAGNOSIS — R062 Wheezing: Secondary | ICD-10-CM | POA: Diagnosis present

## 2020-09-16 DIAGNOSIS — J4541 Moderate persistent asthma with (acute) exacerbation: Secondary | ICD-10-CM | POA: Diagnosis present

## 2020-09-16 DIAGNOSIS — Z8249 Family history of ischemic heart disease and other diseases of the circulatory system: Secondary | ICD-10-CM | POA: Diagnosis not present

## 2020-09-16 DIAGNOSIS — E7800 Pure hypercholesterolemia, unspecified: Secondary | ICD-10-CM | POA: Diagnosis present

## 2020-09-16 DIAGNOSIS — I480 Paroxysmal atrial fibrillation: Secondary | ICD-10-CM | POA: Diagnosis present

## 2020-09-16 DIAGNOSIS — Z7901 Long term (current) use of anticoagulants: Secondary | ICD-10-CM | POA: Diagnosis not present

## 2020-09-16 DIAGNOSIS — Z87891 Personal history of nicotine dependence: Secondary | ICD-10-CM | POA: Diagnosis not present

## 2020-09-16 DIAGNOSIS — Z20822 Contact with and (suspected) exposure to covid-19: Secondary | ICD-10-CM | POA: Diagnosis present

## 2020-09-16 DIAGNOSIS — Z886 Allergy status to analgesic agent status: Secondary | ICD-10-CM | POA: Diagnosis not present

## 2020-09-16 DIAGNOSIS — F419 Anxiety disorder, unspecified: Secondary | ICD-10-CM | POA: Diagnosis present

## 2020-09-16 DIAGNOSIS — J9621 Acute and chronic respiratory failure with hypoxia: Secondary | ICD-10-CM | POA: Diagnosis present

## 2020-09-16 DIAGNOSIS — E875 Hyperkalemia: Secondary | ICD-10-CM | POA: Diagnosis present

## 2020-09-16 DIAGNOSIS — I110 Hypertensive heart disease with heart failure: Secondary | ICD-10-CM | POA: Diagnosis present

## 2020-09-16 DIAGNOSIS — K220 Achalasia of cardia: Secondary | ICD-10-CM | POA: Diagnosis present

## 2020-09-16 DIAGNOSIS — Z803 Family history of malignant neoplasm of breast: Secondary | ICD-10-CM | POA: Diagnosis not present

## 2020-09-16 LAB — CBC
HCT: 36 % (ref 36.0–46.0)
Hemoglobin: 11.7 g/dL — ABNORMAL LOW (ref 12.0–15.0)
MCH: 31.7 pg (ref 26.0–34.0)
MCHC: 32.5 g/dL (ref 30.0–36.0)
MCV: 97.6 fL (ref 80.0–100.0)
Platelets: 252 10*3/uL (ref 150–400)
RBC: 3.69 MIL/uL — ABNORMAL LOW (ref 3.87–5.11)
RDW: 13.7 % (ref 11.5–15.5)
WBC: 11.1 10*3/uL — ABNORMAL HIGH (ref 4.0–10.5)
nRBC: 0 % (ref 0.0–0.2)

## 2020-09-16 LAB — BASIC METABOLIC PANEL
Anion gap: 4 — ABNORMAL LOW (ref 5–15)
BUN: 20 mg/dL (ref 8–23)
CO2: 37 mmol/L — ABNORMAL HIGH (ref 22–32)
Calcium: 8.8 mg/dL — ABNORMAL LOW (ref 8.9–10.3)
Chloride: 99 mmol/L (ref 98–111)
Creatinine, Ser: 0.99 mg/dL (ref 0.44–1.00)
GFR, Estimated: 58 mL/min — ABNORMAL LOW (ref >60)
Glucose, Bld: 126 mg/dL — ABNORMAL HIGH (ref 70–99)
Potassium: 4.1 mmol/L (ref 3.5–5.1)
Sodium: 140 mmol/L (ref 135–145)

## 2020-09-16 LAB — PHOSPHORUS: Phosphorus: 2.5 mg/dL (ref 2.5–4.6)

## 2020-09-16 LAB — PROCALCITONIN: Procalcitonin: <0.1 ng/mL

## 2020-09-16 LAB — MAGNESIUM: Magnesium: 2.2 mg/dL (ref 1.7–2.4)

## 2020-09-16 MED ORDER — METOPROLOL SUCCINATE ER 50 MG PO TB24
50.0000 mg | ORAL_TABLET | Freq: Two times a day (BID) | ORAL | Status: DC
  Administered 2020-09-16 – 2020-09-17 (×2): 50 mg via ORAL
  Filled 2020-09-16 (×2): qty 1

## 2020-09-16 MED ORDER — ACETAMINOPHEN 325 MG PO TABS: 650.0000 mg | ORAL_TABLET | Freq: Four times a day (QID) | ORAL | Status: DC | PRN

## 2020-09-16 NOTE — Progress Notes (Addendum)
Possible PROGRESS NOTE  Lindsay [name] [id number] DOB: 04/18/1940 DOA: 09/14/2020 PCP: Bernerd Limbo, MD   LOS: 0 days   Brief narrative:  Lindsay [name] is a 80 y.o. female with history of chronic respiratory failure on home oxygen, asthma, kyphosis with restrictive lung disease, hypertension, hyperlipidemia, atrial fibrillation on Eliquis status post cardioversion on 09/13/2020 presented to hospital with worsening shortness of breath with hypoxia and was unable to complete sentences.  Patient does have chronic cough which is unchanged.  In the ED, patient was noted to have a 4 L of oxygen by nasal cannula saturating 98% and chest x-ray showed cardiomegaly with pulmonary vascular congestion.  CTA of the chest showed no pulmonary embolism.  Patient was then admitted hospital for further evaluation and treatment.  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  Currently with mild exacerbation.  Continues to have some wheezing.  Patient is on 4 L of oxygen currently and usually on 2 L of oxygen by nasal cannula.  Continue to wean oxygen.  Ambulate the patient.  We will get PT evaluation. CTA was negative for pulmonary embolism.   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  Currently on soft diet.  Procalcitonin less than 0.10.  No signs of infection so no antibiotics.  Encourage incentive  spirometry.  Encourage ambulation.    Acute on chronic diastolic CHF Continue IV diuresis.  Recent echo from 07/2020 showed LV ejection fraction of 60 to 65% with normal LV function.  On Lasix 40 mg IV twice a day..  Continue to monitor closely.  continue intake and output charting.  Currently negative balance for 360 mL.    Elevated troponin Likely demand ischemia in the setting of CHF exacerbation.  Troponin flat.  No plans for further cardiac evaluation.  Paroxysmal atrial fibrillation s/p cardioversion 09/14/20 -Status post cardioversion on 09/14/2020.  Continue Eliquis.  Will resume metoprolol.    Hyperkalemia Resolved.  Potassium 4.1 today.   Anxiety Continue zoloft, remeron and ativan   Debility and deconditioning.  Will get PT evaluation.  Patient uses walker at home.   DVT prophylaxis:  apixaban (ELIQUIS) tablet 5 mg    Code Status: Full code  Family Communication: Spoke with the patient's daughter on the phone and updated her about the clinical condition of the patient.  Status is: Observation  The patient will require care spanning > 2 midnights and should be moved to inpatient because: IV treatments appropriate due to intensity of illness or inability to take PO, and Inpatient level of care appropriate due to severity of illness  Dispo: The patient is from: Independent living facility              Anticipated d/c is to: Independent living facility              Patient currently is not medically stable to d/c.   Difficult to place patient No  Consultants: None  Procedures:  None  Anti-infectives:  None  Anti-infectives (From admission, onward)    Start     Dose/Rate Route Frequency Ordered Stop   09/14/20 1345  Ampicillin-Sulbactam (UNASYN) 3 g in sodium chloride 0.9 % 100 mL IVPB        3 g 200 mL/hr over 30 Minutes Intravenous  Once 09/14/20 1337 09/14/20 1648      Subjective: Today, patient was seen and examined at bedside.  Nursing staff reported  patient has been having bad spells of cough today.  Still on 4 L of oxygen.  Objective: Vitals:   09/16/20 0811 09/16/20 1204  BP: (!) 119/92 126/89  Pulse: 67 72  Resp: 20 18  Temp: 97.8 F (36.6 C) 98 F (36.7 C)  SpO2: 100% 93%    Intake/Output Summary (Last 24 hours) at 09/16/2020 1444 Last data filed at 09/16/2020 1205 Gross per 24 hour  Intake 720 ml  Output 1200 ml  Net -480 ml    Filed Weights   09/14/20 1056 09/15/20 0345 09/16/20 0353  Weight: 96.6 kg 109.1 kg 110 kg   Body mass index is 35.81 kg/m.   Physical Exam:  GENERAL: Patient is alert awake and oriented. Not in obvious distress.  Obese built, on nasal cannula oxygen HENT: No scleral pallor or icterus. Pupils equally reactive to light. Oral mucosa is moist NECK: is supple, no gross swelling noted. CHEST:   Diminished breath sounds bilaterally.  Mild wheezes noted. CVS: S1 and S2 heard, no murmur. Regular rate and rhythm.  ABDOMEN: Soft, non-tender, bowel sounds are present. EXTREMITIES: Bilateral lower extremity trace edema noted CNS: Cranial nerves are intact. No focal motor deficits. SKIN: warm and dry without rashes.  Data Review: I have personally reviewed the following laboratory data and studies,  CBC: Recent Labs  Lab 09/14/20 1137 09/15/20 0445 09/16/20 0120  WBC 10.4 10.3 11.1*  NEUTROABS 9.4*  --   --   HGB 13.0 11.9* 11.7*  HCT 41.9 35.8* 36.0  MCV 101.0* 97.5 97.6  PLT 256 236 [id number]    Basic Metabolic Panel: Recent Labs  Lab 09/14/20 1137 09/14/20 2022 09/15/20 0445 09/16/20 0120  NA 138 138 140 140  K 5.3* 3.5 4.0 4.1  CL 100 98 99 99  CO2 31 30 35* 37*  GLUCOSE 121* 207* 131* 126*  BUN '10 12 13 20  '$ CREATININE 0.71 1.00 0.85 0.99  CALCIUM 8.9 9.0 8.9 8.8*  MG  --  2.3  --  2.2  PHOS  --   --   --  2.5    Liver Function Tests: No results for input(s): AST, ALT, ALKPHOS, BILITOT, PROT, ALBUMIN in the last 168 hours.  No results for input(s): LIPASE, AMYLASE in the  last 168 hours. No results for input(s): AMMONIA in the last 168 hours. Cardiac Enzymes: No results for input(s): CKTOTAL, CKMB, CKMBINDEX, TROPONINI in the last 168 hours. BNP (last 3 results) Recent Labs    08/02/20 1213 09/14/20 1138  BNP 305.4* 409.5*     ProBNP (last 3 results) No results for input(s): PROBNP in the last 8760 hours.  CBG: No results for input(s): GLUCAP in the last 168 hours. Recent Results (from the past 240 hour(s))  Resp Panel by RT-PCR (Flu A&B, Covid) Nasopharyngeal Swab     Status: None   Collection Time: 09/14/20 11:37 AM   Specimen: Nasopharyngeal Swab; Nasopharyngeal(NP) swabs in vial transport medium  Result Value Ref Range Status   SARS Coronavirus 2 by RT PCR NEGATIVE  NEGATIVE Final    Comment: (NOTE) SARS-CoV-2 target nucleic acids are NOT DETECTED.  The SARS-CoV-2 RNA is generally detectable in upper respiratory specimens during the acute phase of infection. The lowest concentration of SARS-CoV-2 viral copies this assay can detect is 138 copies/mL. A negative result does not preclude SARS-Cov-2 infection and should not be used as the sole basis for treatment or other patient management decisions. A negative result may occur with  improper specimen collection/handling, submission of specimen other than nasopharyngeal swab, presence of viral mutation(s) within the areas targeted by this assay, and inadequate number of viral copies(<138 copies/mL). A negative result must be combined with clinical observations, patient history, and epidemiological information. The expected result is Negative.  Fact Sheet for Patients:  EntrepreneurPulse.com.au  Fact Sheet for Healthcare Providers:  IncredibleEmployment.be  This test is no t yet approved or cleared by the [place] FDA and  has been authorized for detection and/or diagnosis of SARS-CoV-2 by FDA under an Emergency Use Authorization (EUA). This EUA  will remain  in effect (meaning this test can be used) for the duration of the COVID-19 declaration under Section 564(b)(1) of the Act, 21 U.S.C.section 360bbb-3(b)(1), unless the authorization is terminated  or revoked sooner.       Influenza A by PCR NEGATIVE NEGATIVE Final   Influenza B by PCR NEGATIVE NEGATIVE Final    Comment: (NOTE) The Xpert Xpress SARS-CoV-2/FLU/RSV plus assay is intended as an aid in the diagnosis of influenza from Nasopharyngeal swab specimens and should not be used as a sole basis for treatment. Nasal washings and aspirates are unacceptable for Xpert Xpress SARS-CoV-2/FLU/RSV testing.  Fact Sheet for Patients: EntrepreneurPulse.com.au  Fact Sheet for Healthcare Providers: IncredibleEmployment.be  This test is not yet approved or cleared by the [place] FDA and has been authorized for detection and/or diagnosis of SARS-CoV-2 by FDA under an Emergency Use Authorization (EUA). This EUA will remain in effect (meaning this test can be used) for the duration of the COVID-19 declaration under Section 564(b)(1) of the Act, 21 U.S.C. section 360bbb-3(b)(1), unless the authorization is terminated or revoked.  Performed at [facility], [place] [address], [place], [place] [address]       Studies: No results found.    Flora Lipps, MD  [facility] 09/16/2020  If 7PM-7AM, please contact night-coverage

## 2020-09-16 NOTE — Progress Notes (Signed)
   09/16/20 1505  Mobility  Activity Refused mobility (Second attempt)

## 2020-09-16 NOTE — Plan of Care (Signed)
  Problem: Education: Goal: Knowledge of General Education information will improve Description: Including pain rating scale, medication(s)/side effects and non-pharmacologic comfort measures Outcome: Progressing   Problem: Health Behavior/Discharge Planning: Goal: Ability to manage health-related needs will improve Outcome: Progressing   Problem: Clinical Measurements: Goal: Ability to maintain clinical measurements within normal limits will improve Outcome: Progressing Goal: Will remain free from infection Outcome: Progressing Goal: Diagnostic test results will improve Outcome: Progressing Goal: Respiratory complications will improve Outcome: Progressing Goal: Cardiovascular complication will be avoided Outcome: Progressing   Problem: Activity: Goal: Risk for activity intolerance will decrease Outcome: Progressing   Problem: Elimination: Goal: Will not experience complications related to urinary retention Outcome: Progressing   Problem: Pain Managment: Goal: General experience of comfort will improve Outcome: Progressing   Problem: Skin Integrity: Goal: Risk for impaired skin integrity will decrease Outcome: Progressing   Problem: Education: Goal: Ability to demonstrate management of disease process will improve Outcome: Progressing Goal: Ability to verbalize understanding of medication therapies will improve Outcome: Progressing Goal: Individualized Educational Video(s) Outcome: Progressing   Problem: Activity: Goal: Capacity to carry out activities will improve Outcome: Progressing   Problem: Cardiac: Goal: Ability to achieve and maintain adequate cardiopulmonary perfusion will improve Outcome: Progressing

## 2020-09-16 NOTE — Evaluation (Signed)
Physical Therapy Evaluation Patient Details Name: Lindsay [name] MRN: [id number] DOB: 1940-03-25 Today's Date: 09/16/2020   History of Present Illness  The pt is an 80 yo female presenting from home on 8/23 with SOB following cardioversion on 8/22, found to have acute on chronic respiratory failure.  PMH includes: chronic respiratory failure on home O2, asthma, kyphosis with restrictive lung disease, HTN, HLD, and afib.   Clinical Impression  Pt in bed upon arrival of PT, agreeable to evaluation at this time. Prior to admission the pt was mobilizing with use of 4-wheel walker at her independent living facility, but was limited to 10 ft at a time due to chronic SOB. The pt now presents with limitations in functional mobility, endurance, and activity tolerance, and will continue to benefit from skilled PT to address these deficits. The pt was able to complete bed mobility and sit-stand without physical assistance, but with supervision and UE support for safety. The pt completed 2 short bouts of ambulation in the room with use of RW and 4LO2 to maintain SPO2. She will continue to benefit from skilled PT to further improve functional capacity as well as stability to allow for greater safety with independence.      Follow Up Recommendations Home health PT;Supervision - Intermittent    Equipment Recommendations  None recommended by PT    Recommendations for Other Services       Precautions / Restrictions Precautions Precautions: Fall;Other (comment) Precaution Comments: watch SpO2 Restrictions Weight Bearing Restrictions: No      Mobility  Bed Mobility Overal bed mobility: Needs Assistance Bed Mobility: Supine to Sit     Supine to sit: Supervision     General bed mobility comments: supervision with use of bed rail and HOB slightly elevated    Transfers Overall transfer level: Needs assistance Equipment used: Rolling walker (2 wheeled) Transfers: Sit to/from Stand Sit to Stand:  Supervision         General transfer comment: supervision for safety, pt able to rise to standing with good use of hands, no physical assist  Ambulation/Gait Ambulation/Gait assistance: Min guard Gait Distance (Feet): 10 Feet (+ 15 ft) Assistive device: Rolling walker (2 wheeled) Gait Pattern/deviations: Step-through pattern;Decreased stride length Gait velocity: decreased   General Gait Details: pt with slow but steady gait, on 4L O2 this session with VSS. reliant on BUE support         Balance Overall balance assessment: Needs assistance Sitting-balance support: No upper extremity supported;Feet supported Sitting balance-Leahy Scale: Fair Sitting balance - Comments: pt unable to reach outside BOS, cannot reach ft   Standing balance support: Bilateral upper extremity supported;During functional activity Standing balance-Leahy Scale: Poor Standing balance comment: reliant on BUE support                             Pertinent Vitals/Pain Pain Assessment: Faces Faces Pain Scale: Hurts little more Pain Location: IV site R forearm, R thigh (numb) Pain Descriptors / Indicators: Discomfort;Burning Pain Intervention(s): Monitored during session;Repositioned    Home Living Family/patient expects to be discharged to:: Private residence Living Arrangements: Alone Available Help at Discharge: Family;Available PRN/intermittently Type of Home: Independent living facility Home Access: Level entry     Home Layout: One level Home Equipment: Walker - 4 wheels;Toilet riser;Grab bars - tub/shower Additional Comments: on 2L O2 at baseline    Prior Function Level of Independence: Independent with assistive device(s)  Comments: uses rolaltor, able to ambulate ~10 ft at a time prior to being SOB     Hand Dominance   Dominant Hand: Right    Extremity/Trunk Assessment   Upper Extremity Assessment Upper Extremity Assessment: Defer to OT evaluation    Lower  Extremity Assessment Lower Extremity Assessment: Generalized weakness;RLE deficits/detail RLE Deficits / Details: pt reporting thigh is "numb and warm" RN alerted but all distal pulses palpable. no redness.    Cervical / Trunk Assessment Cervical / Trunk Assessment: Kyphotic  Communication   Communication: No difficulties  Cognition Arousal/Alertness: Awake/alert Behavior During Therapy: WFL for tasks assessed/performed Overall Cognitive Status: Within Functional Limits for tasks assessed                                        General Comments General comments (skin integrity, edema, etc.): VSS on 4L O2. Pt HR from 80s to max 160    Exercises     Assessment/Plan    PT Assessment Patient needs continued PT services  PT Problem List Decreased strength;Decreased range of motion;Decreased activity tolerance;Decreased balance;Decreased mobility;Cardiopulmonary status limiting activity       PT Treatment Interventions DME instruction;Gait training;Stair training;Functional mobility training;Therapeutic activities;Therapeutic exercise;Balance training;Patient/family education    PT Goals (Current goals can be found in the Care Plan section)  Acute Rehab PT Goals Patient Stated Goal: return home PT Goal Formulation: With patient Time For Goal Achievement: 09/30/20 Potential to Achieve Goals: Good    Frequency Min 3X/week    AM-PAC PT "6 Clicks" Mobility  Outcome Measure Help needed turning from your back to your side while in a flat bed without using bedrails?: A Little Help needed moving from lying on your back to sitting on the side of a flat bed without using bedrails?: A Little Help needed moving to and from a bed to a chair (including a wheelchair)?: A Little Help needed standing up from a chair using your arms (e.g., wheelchair or bedside chair)?: A Little Help needed to walk in hospital room?: A Little Help needed climbing 3-5 steps with a railing? : A  Little 6 Click Score: 18    End of Session Equipment Utilized During Treatment: Gait belt;Oxygen Activity Tolerance: Patient tolerated treatment well Patient left: in chair;with call bell/phone within reach;with chair alarm set;with family/visitor present Nurse Communication: Mobility status PT Visit Diagnosis: Other abnormalities of gait and mobility (R26.89)    Time: [id number] PT Time Calculation (min) (ACUTE ONLY): 44 min   Charges:   PT Evaluation $PT Eval Low Complexity: 1 Low PT Treatments $Gait Training: 8-22 mins $Therapeutic Activity: 8-22 mins        Inocencio Homes, PT, DPT   Acute Rehabilitation Department Pager #: [phone]  Sandra Cockayne 09/16/2020, 5:33 PM

## 2020-09-16 NOTE — Progress Notes (Signed)
Tele called about pt HR being above 150, pt up to the chair, resting comfortably with daughter at the bedside, pt denies all s/s of distress, pain or SOB.  Pt has hx of afib  Continuing to monitor and assess

## 2020-09-17 DIAGNOSIS — I480 Paroxysmal atrial fibrillation: Secondary | ICD-10-CM | POA: Diagnosis not present

## 2020-09-17 DIAGNOSIS — J45909 Unspecified asthma, uncomplicated: Secondary | ICD-10-CM

## 2020-09-17 DIAGNOSIS — I5033 Acute on chronic diastolic (congestive) heart failure: Secondary | ICD-10-CM | POA: Diagnosis not present

## 2020-09-17 DIAGNOSIS — J4541 Moderate persistent asthma with (acute) exacerbation: Secondary | ICD-10-CM | POA: Diagnosis not present

## 2020-09-17 DIAGNOSIS — J9621 Acute and chronic respiratory failure with hypoxia: Secondary | ICD-10-CM | POA: Diagnosis not present

## 2020-09-17 LAB — CBC
HCT: 38.5 % (ref 36.0–46.0)
Hemoglobin: 12.5 g/dL (ref 12.0–15.0)
MCH: 31.2 pg (ref 26.0–34.0)
MCHC: 32.5 g/dL (ref 30.0–36.0)
MCV: 96 fL (ref 80.0–100.0)
Platelets: 273 10*3/uL (ref 150–400)
RBC: 4.01 MIL/uL (ref 3.87–5.11)
RDW: 13.7 % (ref 11.5–15.5)
WBC: 10.5 10*3/uL (ref 4.0–10.5)
nRBC: 0 % (ref 0.0–0.2)

## 2020-09-17 LAB — BASIC METABOLIC PANEL
Anion gap: 6 (ref 5–15)
BUN: 24 mg/dL — ABNORMAL HIGH (ref 8–23)
CO2: 37 mmol/L — ABNORMAL HIGH (ref 22–32)
Calcium: 8.9 mg/dL (ref 8.9–10.3)
Chloride: 97 mmol/L — ABNORMAL LOW (ref 98–111)
Creatinine, Ser: 0.89 mg/dL (ref 0.44–1.00)
GFR, Estimated: >60 mL/min (ref >60)
Glucose, Bld: 109 mg/dL — ABNORMAL HIGH (ref 70–99)
Potassium: 3.7 mmol/L (ref 3.5–5.1)
Sodium: 140 mmol/L (ref 135–145)

## 2020-09-17 LAB — MAGNESIUM: Magnesium: 2.1 mg/dL (ref 1.7–2.4)

## 2020-09-17 MED ORDER — PREDNISONE 10 MG PO TABS: ORAL_TABLET | ORAL | 0 refills | Status: DC

## 2020-09-17 MED ORDER — UMECLIDINIUM BROMIDE 62.5 MCG/INH IN AEPB: 1.0000 | INHALATION_SPRAY | Freq: Every day | RESPIRATORY_TRACT | 2 refills | Status: DC

## 2020-09-17 MED ORDER — FLUTICASONE FUROATE-VILANTEROL 200-25 MCG/INH IN AEPB: 1.0000 | INHALATION_SPRAY | Freq: Every day | RESPIRATORY_TRACT | 3 refills | Status: DC

## 2020-09-17 NOTE — Discharge Summary (Signed)
Physician Discharge Summary  Lindsay [name] [id number] DOB: 02-22-1940 DOA: 09/14/2020  PCP: Bernerd Limbo, MD  Admit date: 09/14/2020 Discharge date: 09/17/2020  Admitted From: Home  Discharge disposition: Home PT  Recommendations for Outpatient Follow-Up:   Follow up with your primary care provider in one week.  Check CBC, BMP, magnesium in the next visit Follow-up with your cardiologist as outpatient as scheduled by the clinic  Discharge Diagnosis:   Principal Problem:   Acute on chronic respiratory failure with hypoxia (HCC) Active Problems:   AF (paroxysmal atrial fibrillation) (HCC)   Acute exacerbation of moderate persistent extrinsic asthma   Acute on chronic diastolic CHF (congestive heart failure) (HCC)   Elevated troponin   Hyperkalemia   Discharge Condition: Improved.  Diet recommendation: Low sodium, heart healthy.    Wound care: None.  Code status: Full.   History of Present Illness:   Lindsay [name] is a 80 y.o. female with history of chronic respiratory failure on home oxygen, asthma, kyphosis with restrictive lung disease, hypertension, hyperlipidemia, atrial fibrillation on Eliquis status post cardioversion on 09/13/2020 presented to hospital with worsening shortness of breath with hypoxia and was unable to complete sentences.  Patient does have chronic cough which is unchanged.  In the ED, patient was noted to have a 4 L of oxygen by nasal cannula saturating 98% and chest x-ray showed cardiomegaly with pulmonary vascular congestion.  CTA of the chest showed no pulmonary embolism.  Patient was then admitted hospital for further evaluation and treatment.  Hospital Course:   Following conditions were addressed during hospitalization as listed below,  Acute on chronic respiratory failure with hypoxia  With a history of asthma and CHF.  Presented with mild exacerbation.  Patient has improved with the steroids, diuretics and is at his baseline. CTA was negative  for pulmonary embolism.   Acute exacerbation of moderate persistent extrinsic asthma Continue IV steroids, duo nebs, patient was supposed to be on Trelegy Ellipta but stopped taking due to cost factor.  At this time it has been changed to [place].  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  He was put on soft diet.  Procalcitonin less than 0.10.  No signs of infection.    Acute on chronic diastolic CHF Patient received IV diuretics.  Recent echo from 07/2020 showed LV ejection fraction of 60 to 65% with normal LV function.  Has compensated at this time.  Patient will receive diuretic from home.  Cardiology has seen the patient today and determined that patient is stable for disposition home.     Elevated troponin Likely demand ischemia in the setting of CHF exacerbation.  Troponin flat.  No plans for further cardiac evaluation.   Paroxysmal atrial fibrillation s/p cardioversion 09/14/20 -Status post cardioversion on 09/14/2020.  Continue Eliquis and metoprolol.     Hyperkalemia Resolved.  Potassium of 3.7.   Anxiety Continue zoloft, remeron and ativan from home.   Debility and deconditioning.  Physical therapy has seen the patient and recommend home PT on discharge.  Disposition.  At this time, patient is stable for disposition home with outpatient PCP and cardiology follow-up.  Medical Consultants:   Cardiology  Procedures:    None Subjective:   Today, patient was seen and examined at bedside.  Patient feels much better today.  Breathing has improved.  Denies any nausea, vomiting, shortness of breath cough  or fever today.  Wishes to go back to her independent living.  Discharge Exam:   Vitals:   09/17/20 0755 09/17/20 0933  BP:  107/62  Pulse:  69  Resp:    Temp:    SpO2: 93% 95%   Vitals:   09/16/20 1936 09/17/20 0413  09/17/20 0755 09/17/20 0933  BP: 107/64 115/77  107/62  Pulse: 94 (!) 59  69  Resp: 18 17    Temp: 98 F (36.7 C) 97.8 F (36.6 C)    TempSrc: Oral Oral    SpO2: 93% 97% 93% 95%  Weight:  106.9 kg    Height:        General: Alert awake, not in obvious distress HENT: pupils equally reacting to light,  No scleral pallor or icterus noted. Oral mucosa is moist.  Chest:  Clear breath sounds.  Diminished breath sounds bilaterally. No crackles or wheezes.  CVS: S1 &S2 heard. No murmur.  Regular rate and rhythm. Abdomen: Soft, nontender, nondistended.  Bowel sounds are heard.   Extremities: No cyanosis, clubbing or edema.  Peripheral pulses are palpable. Psych: Alert, awake and oriented, normal mood CNS:  No cranial nerve deficits.  Power equal in all extremities.   Skin: Warm and dry.  No rashes noted.  The results of significant diagnostics from this hospitalization (including imaging, microbiology, ancillary and laboratory) are listed below for reference.     Diagnostic Studies:   CT Angio Chest PE W/Cm &/Or Wo Cm  Result Date: 09/14/2020 CLINICAL DATA:  AFib cardioversion yesterday. Shortness of breath. Pulmonary embolus suspected. EXAM: CT ANGIOGRAPHY CHEST WITH CONTRAST TECHNIQUE: Multidetector CT imaging of the chest was performed using the standard protocol during bolus administration of intravenous contrast. Multiplanar CT image reconstructions and MIPs were obtained to evaluate the vascular anatomy. CONTRAST:  73m OMNIPAQUE IOHEXOL 350 MG/ML SOLN COMPARISON:  08/02/2020 FINDINGS: Cardiovascular: Heart is enlarged. Mild atherosclerotic calcification is noted in the wall of the thoracic aorta. No large central pulmonary embolus. Peripheral segmental and subsegmental pulmonary arteries are not well evaluated, specially in the lower lobes due to collapse/consolidation with superimposed motion artifact. Mediastinum/Nodes: No mediastinal lymphadenopathy. There is no hilar lymphadenopathy.  Esophagus mildly patulous There is no axillary lymphadenopathy. Lungs/Pleura: Diffuse ground-glass opacities seen in both lungs with bibasilar collapse/consolidative disease and small bilateral pleural effusions Upper Abdomen: Benign-appearing calcified lesions in the spleen may be granulomatous. Musculoskeletal: No worrisome lytic or sclerotic osseous abnormality. Multilevel thoracic compression deformity is stable in the interval. Review of the MIP images confirms the above findings. IMPRESSION: 1. No large central pulmonary embolus. Peripheral segmental and subsegmental pulmonary arteries are not well evaluated due to collapse/consolidative disease with superimposed motion artifact. 2. Diffuse ground-glass opacities in both lungs, potentially related to pulmonary edema. 3. Bibasilar collapse/consolidative disease and small bilateral pleural effusions. Pneumonia not excluded. 4. Aortic Atherosclerosis (ICD10-I70.0). Electronically Signed   By: EMisty StanleyM.D.   On: 09/14/2020 12:52   DG Chest Portable 1 View  Result Date: 09/14/2020 CLINICAL DATA:  80year old female with shortness of breath. Cardioversion yesterday. EXAM: PORTABLE CHEST 1 VIEW COMPARISON:  CTA chest 08/02/2020 and earlier. FINDINGS: Portable AP semi upright view at 1109 hours. Stable cardiomegaly and mediastinal contours. Stable lung volumes. Small right pleural effusion appears stable. But left lung base opacification is increased, and pulmonary vasculature now appears indistinct. Visualized tracheal air column is within normal limits. No pneumothorax. No air bronchograms. IMPRESSION: 1. Chronic cardiomegaly with indistinct pulmonary vasculature suspicious for acute interstitial edema. 2.  Chronic pleural effusions, although increased left lung base opacification since last month which might be due to increased diffusion and/or atelectasis. Electronically Signed   By: Genevie Ann M.D.   On: 09/14/2020 11:37     Labs:   Basic Metabolic  Panel: Recent Labs  Lab 09/14/20 1137 09/14/20 2022 09/15/20 0445 09/16/20 0120 09/17/20 0341  NA 138 138 140 140 140  K 5.3* 3.5 4.0 4.1 3.7  CL 100 98 99 99 97*  CO2 31 30 35* 37* 37*  GLUCOSE 121* 207* 131* 126* 109*  BUN '10 12 13 20 '$ 24*  CREATININE 0.71 1.00 0.85 0.99 0.89  CALCIUM 8.9 9.0 8.9 8.8* 8.9  MG  --  2.3  --  2.2 2.1  PHOS  --   --   --  2.5  --    GFR Estimated Creatinine Clearance: 65.7 mL/min (by C-G formula based on SCr of 0.89 mg/dL). Liver Function Tests: No results for input(s): AST, ALT, ALKPHOS, BILITOT, PROT, ALBUMIN in the last 168 hours. No results for input(s): LIPASE, AMYLASE in the last 168 hours. No results for input(s): AMMONIA in the last 168 hours. Coagulation profile No results for input(s): INR, PROTIME in the last 168 hours.  CBC: Recent Labs  Lab 09/14/20 1137 09/15/20 0445 09/16/20 0120 09/17/20 0341  WBC 10.4 10.3 11.1* 10.5  NEUTROABS 9.4*  --   --   --   HGB 13.0 11.9* 11.7* 12.5  HCT 41.9 35.8* 36.0 38.5  MCV 101.0* 97.5 97.6 96.0  PLT 256 236 252 273   Cardiac Enzymes: No results for input(s): CKTOTAL, CKMB, CKMBINDEX, TROPONINI in the last 168 hours. BNP: Invalid input(s): POCBNP CBG: No results for input(s): GLUCAP in the last 168 hours. D-Dimer No results for input(s): DDIMER in the last 72 hours. Hgb A1c No results for input(s): HGBA1C in the last 72 hours. Lipid Profile No results for input(s): CHOL, HDL, LDLCALC, TRIG, CHOLHDL, LDLDIRECT in the last 72 hours. Thyroid function studies No results for input(s): TSH, T4TOTAL, T3FREE, THYROIDAB in the last 72 hours.  Invalid input(s): FREET3 Anemia work up No results for input(s): VITAMINB12, FOLATE, FERRITIN, TIBC, IRON, RETICCTPCT in the last 72 hours. Microbiology Recent Results (from the past 240 hour(s))  Resp Panel by RT-PCR (Flu A&B, Covid) Nasopharyngeal Swab     Status: None   Collection Time: 09/14/20 11:37 AM   Specimen: Nasopharyngeal Swab;  Nasopharyngeal(NP) swabs in vial transport medium  Result Value Ref Range Status   SARS Coronavirus 2 by RT PCR NEGATIVE NEGATIVE Final    Comment: (NOTE) SARS-CoV-2 target nucleic acids are NOT DETECTED.  The SARS-CoV-2 RNA is generally detectable in upper respiratory specimens during the acute phase of infection. The lowest concentration of SARS-CoV-2 viral copies this assay can detect is 138 copies/mL. A negative result does not preclude SARS-Cov-2 infection and should not be used as the sole basis for treatment or other patient management decisions. A negative result may occur with  improper specimen collection/handling, submission of specimen other than nasopharyngeal swab, presence of viral mutation(s) within the areas targeted by this assay, and inadequate number of viral copies(<138 copies/mL). A negative result must be combined with clinical observations, patient history, and epidemiological information. The expected result is Negative.  Fact Sheet for Patients:  EntrepreneurPulse.com.au  Fact Sheet for Healthcare Providers:  IncredibleEmployment.be  This test is no t yet approved or cleared by the [place] FDA and  has been authorized for detection and/or diagnosis of SARS-CoV-2 by  FDA under an Emergency Use Authorization (EUA). This EUA will remain  in effect (meaning this test can be used) for the duration of the COVID-19 declaration under Section 564(b)(1) of the Act, 21 U.S.C.section 360bbb-3(b)(1), unless the authorization is terminated  or revoked sooner.       Influenza A by PCR NEGATIVE NEGATIVE Final   Influenza B by PCR NEGATIVE NEGATIVE Final    Comment: (NOTE) The Xpert Xpress SARS-CoV-2/FLU/RSV plus assay is intended as an aid in the diagnosis of influenza from Nasopharyngeal swab specimens and should not be used as a sole basis for treatment. Nasal washings and aspirates are unacceptable for Xpert Xpress  SARS-CoV-2/FLU/RSV testing.  Fact Sheet for Patients: EntrepreneurPulse.com.au  Fact Sheet for Healthcare Providers: IncredibleEmployment.be  This test is not yet approved or cleared by the [place] FDA and has been authorized for detection and/or diagnosis of SARS-CoV-2 by FDA under an Emergency Use Authorization (EUA). This EUA will remain in effect (meaning this test can be used) for the duration of the COVID-19 declaration under Section 564(b)(1) of the Act, 21 U.S.C. section 360bbb-3(b)(1), unless the authorization is terminated or revoked.  Performed at [facility], [place] [address] [place], [place] [address]      Discharge Instructions:   Discharge Instructions     (HEART FAILURE PATIENTS) Call MD:  Anytime you have any of the following symptoms: 1) 3 pound weight gain in 24 hours or 5 pounds in 1 week 2) shortness of breath, with or without a dry hacking cough 3) swelling in the hands, feet or stomach 4) if you have to sleep on extra pillows at night in order to breathe.   Complete by: As directed    Diet - low sodium heart healthy   Complete by: As directed    Limit fluid to 50 ounces a day (1586m/day)   Discharge instructions   Complete by: As directed    Follow up with your family doctor in one week. Check blood work at that time.   Heart Failure patients record your daily weight using the same scale at the same time of day   Complete by: As directed    Increase activity slowly   Complete by: As directed    STOP any activity that causes chest pain, shortness of breath, dizziness, sweating, or exessive weakness   Complete by: As directed       Allergies as of 09/17/2020       Reactions   Acetaminophen-codeine Other (See Comments)   Agitation   Codeine Other (See Comments)   hallucinations        Medication List     TAKE these medications    albuterol 108 (90 Base) MCG/ACT inhaler Commonly known as:  VENTOLIN HFA Inhale 1-2 puffs into the lungs every 6 (six) hours as needed for wheezing or shortness of breath.   alendronate 70 MG tablet Commonly known as: FOSAMAX Take 70 mg by mouth every Saturday.   apixaban 5 MG Tabs tablet Commonly known as: ELIQUIS Take 5 mg by mouth 2 (two) times daily.   atorvastatin 20 MG tablet Commonly known as: LIPITOR Take 20 mg by mouth every evening.   benzonatate 200 MG capsule Commonly known as: TESSALON Take 200 mg by mouth daily as needed for cough.   CALCIUM 1200+D3 PO Take 1 tablet by mouth in the morning.   Demadex 20 MG tablet Generic drug: torsemide Take 2 tablets (40 mg) by mouth daily every morning. Take an  additional 2 tablets (40 mg) as needed for weight gain of 2 lb overnight or 5 lb in one week.   dextromethorphan-guaiFENesin 30-600 MG 12hr tablet Commonly known as: MUCINEX DM Take 2 tablets by mouth 2 (two) times daily.   fluticasone furoate-vilanterol 200-25 MCG/INH Aepb Commonly known as: Breo Ellipta Inhale 1 puff into the lungs daily.   ipratropium-albuterol 0.5-2.5 (3) MG/3ML Soln Commonly known as: DUONEB Inhale 3 mLs into the lungs every 6 (six) hours as needed (wheezing).   LORazepam 1 MG tablet Commonly known as: ATIVAN Take 1 mg by mouth 2 (two) times daily.   metoprolol succinate 50 MG 24 hr tablet Commonly known as: TOPROL-XL Take 50 mg by mouth 2 (two) times daily.   mirtazapine 45 MG tablet Commonly known as: REMERON Take 45 mg by mouth at bedtime.   multivitamin with minerals Tabs tablet Take 1 tablet by mouth in the morning.   OXYGEN Inhale 2 L into the lungs daily as needed (shortness of breath or activity level).   pantoprazole 20 MG tablet Commonly known as: Protonix Take 1 tablet (20 mg total) by mouth daily.   potassium chloride 10 MEQ tablet Commonly known as: KLOR-CON Take 2 tablets (20 mEq total) by mouth daily.   predniSONE 10 MG tablet Commonly known as: DELTASONE Take 4  tablets (40 mg) daily for 2 days, then, Take 3 tablets (30 mg) daily for 2 days, then, Take 2 tablets (20 mg) daily for 2 days, then, Take 1 tablets (10 mg) daily for 1 days, then stop   sertraline 100 MG tablet Commonly known as: ZOLOFT Take 100 mg by mouth at bedtime.   umeclidinium bromide 62.5 MCG/INH Aepb Commonly known as: INCRUSE ELLIPTA Inhale 1 puff into the lungs daily. Start taking on: September 18, 2020        Follow-up Information     Bernerd Limbo, MD. Go on 09/20/2020.   Specialty: Family Medicine Why: '@10'$ :50am Contact information: [place] [address] [phone]         Pixie Casino, MD .   Specialty: Cardiology Contact information: [address] [place] [address] [phone]         [facility] Follow up.   Why: patient to do therapy onsite with [facility], PT, OT, and ST                 Time coordinating discharge: 39 minutes  Signed:  Reynold Mantell  [facility] 09/17/2020, 4:55 PM

## 2020-09-17 NOTE — Progress Notes (Signed)
Went over discharge instruction with patient. Patient made aware of follow up and medication changes. PIV has been removed, slight redness above the PIV, RN marked area. Site is clean, dry, and not swollen. Patient expresses that it does not sting at the moment.

## 2020-09-17 NOTE — Progress Notes (Signed)
Physical Therapy Treatment Patient Details Name: [place] [name] MRN: [id number] DOB: 1940/11/28 Today's Date: 09/17/2020    History of Present Illness The pt is an 80 yo female presenting from home on 8/23 with SOB following cardioversion on 8/22, found to have acute on chronic respiratory failure.  PMH includes: chronic respiratory failure on home O2, asthma, kyphosis with restrictive lung disease, HTN, HLD, and Afib.    PT Comments    Pt received in supine, noted to be incontinent but pt seemingly unaware, pt agreeable to therapy session with encouragement. She demonstrates good participation and tolerance for transfer, gait and therapeutic exercise instruction but noted to have decreased insight into safety and importance of continued mobility. Pt would benefit from increased supervision/assist throughout the day when she returns home. SpO2 desat with exertion on 3L O2  but improves with seated rest break and cues for pursed-lip breathing. Pt up in chair with alarm on for safety at end of session and nsg notified she needs new bed linens. Pt continues to benefit from PT services to progress toward functional mobility goals.    Follow Up Recommendations  Home health PT;Supervision - Intermittent     Equipment Recommendations  None recommended by PT    Recommendations for Other Services       Precautions / Restrictions Precautions Precautions: Fall;Other (comment) Precaution Comments: watch SpO2 Restrictions Weight Bearing Restrictions: No    Mobility  Bed Mobility Overal bed mobility: Needs Assistance Bed Mobility: Supine to Sit     Supine to sit: Supervision;HOB elevated     General bed mobility comments: supervision with use of bed rail and HOB slightly elevated, pt unable to tolerate flat HOB; verbal cues for technique and self-assist    Transfers Overall transfer level: Needs assistance Equipment used: Rolling walker (2 wheeled) Transfers: Sit to/from Stand Sit to  Stand: Supervision         General transfer comment: supervision for safety, pt able to rise to standing with good use of hands, no physical assist, cues for technique given with RW  Ambulation/Gait Ambulation/Gait assistance: Min guard Gait Distance (Feet): 15 Feet (x2) Assistive device: Rolling walker (2 wheeled) Gait Pattern/deviations: Step-through pattern;Decreased stride length Gait velocity: decreased   General Gait Details: pt with slow but steady gait, on 3L O2 this session and DOE 2/4 with exertion and needs cues for pursed-lip breathing with desat to 86%, improves to 93% (poor waveform at times so unclear if she actually did desat). reliant on BUE support   Stairs             Wheelchair Mobility    Modified Rankin (Stroke Patients Only)       Balance Overall balance assessment: Needs assistance Sitting-balance support: No upper extremity supported;Feet supported Sitting balance-Leahy Scale: Fair Sitting balance - Comments: pt unable to reach outside BOS, cannot reach ft   Standing balance support: Bilateral upper extremity supported;During functional activity Standing balance-Leahy Scale: Poor Standing balance comment: reliant on BUE support                Cognition Arousal/Alertness: Awake/alert Behavior During Therapy: WFL for tasks assessed/performed;Anxious Overall Cognitive Status: Within Functional Limits for tasks assessed                     General Comments: pt anxious to have purewick removed, discussed with her importance of OOB to [facility] or toilet as she will not have purewick at home, also pt bed was saturated with urine and her  gown was soaked despite purewick and she was seemingly unware of this although was able to state "I think my bed smells"      Exercises Other Exercises Other Exercises: supine BLE AROM: ankle pumps and circles, quad sets, glute sets, SAQ x10 reps ea Other Exercises: seated BLE AROM: hip flexion, LAQ x10  reps ea    General Comments General comments (skin integrity, edema, etc.): HR 61-70 bpm, see gait section for details on VS      Pertinent Vitals/Pain Pain Assessment: Faces Faces Pain Scale: Hurts a little bit Pain Location: IV site R forearm Pain Descriptors / Indicators: Discomfort;Burning Pain Intervention(s): Monitored during session;Repositioned           PT Goals (current goals can now be found in the care plan section) Acute Rehab PT Goals Patient Stated Goal: return home PT Goal Formulation: With patient Time For Goal Achievement: 09/30/20 Potential to Achieve Goals: Good Progress towards PT goals: Progressing toward goals    Frequency    Min 3X/week      PT Plan Current plan remains appropriate       AM-PAC PT "6 Clicks" Mobility   Outcome Measure  Help needed turning from your back to your side while in a flat bed without using bedrails?: A Little Help needed moving from lying on your back to sitting on the side of a flat bed without using bedrails?: A Little Help needed moving to and from a bed to a chair (including a wheelchair)?: A Little Help needed standing up from a chair using your arms (e.g., wheelchair or bedside chair)?: A Little Help needed to walk in hospital room?: A Little Help needed climbing 3-5 steps with a railing? : A Little 6 Click Score: 18    End of Session Equipment Utilized During Treatment: Gait belt;Oxygen Activity Tolerance: Patient tolerated treatment well Patient left: in chair;with call bell/phone within reach;with chair alarm set (RN notified she doesn't have a new purewick yet and her bed sheets need to be changed) Nurse Communication: Mobility status;Other (comment) (pt does not have purewick on, can get up with min guard to [facility] would benefit from this instead of purewick) PT Visit Diagnosis: Other abnormalities of gait and mobility (R26.89)     Time: [id number] PT Time Calculation (min) (ACUTE ONLY): 41  min  Charges:  $Gait Training: 8-22 mins $Therapeutic Exercise: 8-22 mins $Therapeutic Activity: 8-22 mins                     Sanita Estrada P., PTA Acute Rehabilitation Services Pager: [phone] Office: [place] 09/17/2020, 12:39 PM

## 2020-09-17 NOTE — TOC Transition Note (Addendum)
Transition of Care [facility]) - CM/SW Discharge Note   Patient Details  Name: [name] MRN: [id number] Date of Birth: May 23, 1940  Transition of [facility]) CM/SW Contact:  Zenon Mayo, RN Phone Number: 09/17/2020, 9:28 AM   Clinical Narrative:    Patient is from [facility] at [facility], she gets her therapy thru [facility] , will need script for outpatient therapy for PT, OT and ST per [facility].  Their fax number is [phone].   Patient is also on home oxygen, her daughter will bring a tank her to take her home with it.   Final next level of care: Home/Self Care Barriers to Discharge: No Barriers Identified   Patient Goals and CMS Choice Patient states their goals for this hospitalization and ongoing recovery are:: return home to IDL      Discharge Placement                       Discharge Plan and Services                  DME Agency: NA       HH Arranged: NA          Social Determinants of Health (SDOH) Interventions     Readmission Risk Interventions No flowsheet data found.

## 2020-09-18 ENCOUNTER — Other Ambulatory Visit: Payer: Self-pay | Admitting: Internal Medicine

## 2020-09-18 MED ORDER — PREDNISONE 10 MG PO TABS: ORAL_TABLET | ORAL | 0 refills | Status: DC

## 2020-09-20 ENCOUNTER — Other Ambulatory Visit: Payer: Self-pay | Admitting: Neurology

## 2020-09-20 DIAGNOSIS — I482 Chronic atrial fibrillation, unspecified: Secondary | ICD-10-CM

## 2020-09-20 NOTE — Progress Notes (Signed)
Patient referred to outpatient neurology

## 2020-09-23 NOTE — Telephone Encounter (Signed)
Hello Dr. Erin Fulling,  Please advise on mychart message below, thanks!  Hi,  I was in the hospital for acute HF and COPD. They put me on Ellipta Breo and Ellipta Incruse. These cost more together than Trelegy would. Do you think that after I use these 2 inhalers, I could switch to Trelegy? If so, I can apply for a grant to [employer], the manufacturer,  and they would supply it to me most likely.  Thank you,  [name]

## 2020-09-28 ENCOUNTER — Ambulatory Visit: Payer: Medicare (Managed Care) | Admitting: Obstetrics & Gynecology

## 2020-09-28 ENCOUNTER — Other Ambulatory Visit: Payer: Self-pay

## 2020-09-28 ENCOUNTER — Encounter: Payer: Self-pay | Admitting: Obstetrics & Gynecology

## 2020-09-28 DIAGNOSIS — R32 Unspecified urinary incontinence: Secondary | ICD-10-CM | POA: Diagnosis not present

## 2020-09-28 MED ORDER — MIRABEGRON ER 25 MG PO TB24: 25.0000 mg | ORAL_TABLET | Freq: Every day | ORAL | 1 refills | Status: DC

## 2020-09-28 NOTE — Progress Notes (Signed)
New GYN presents per notes to F/U /check for LSIL and bladder incontinence.  Pt states she has not had a pap in 2 yrs. Per pt she has not had a HYST.  Pt states she does not have any dysuria today. Pt has to wear depends everyday  Recently in hospital dues to having fluid around lungs.   Last Mammogram: 2022 per pr WNL

## 2020-09-28 NOTE — Progress Notes (Signed)
Patient ID: [name], female   DOB: 27-Oct-1940, 80 y.o.   MRN: [id number]  Chief Complaint  Patient presents with   Gynecologic Exam    HPI [name] is a 80 y.o. female.  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 She moved from [name] in the last 2 years and reports she had had pap tests and possibly mild dysplasia either on pap or when a vaginal polyp was biopsied. No records to review from those tests and she has difficulty recalling the details. PCP sent her for evaluation although she was seen by Lindsay Leavell, MD for urogyn evaluation 04/2020 due to incontinence. No vaginal lesion was seen and she was to return for pessary. The patient did not take medication prescribed for OAB  HPI  Past Medical History:  Diagnosis Date   Asthma    Atrial fibrillation ([place])    High cholesterol    HTN (hypertension)    Respiratory failure with hypoxia [name])     Past Surgical History:  Procedure Laterality Date   CARDIOVERSION N/A 09/13/2020   Procedure: CARDIOVERSION;  Surgeon: [place] Munroe, MD;  Location: [name];  Service: Cardiovascular;  Laterality: N/A;   ESOPHAGOGASTRODUODENOSCOPY N/A 08/05/2020   Procedure: ESOPHAGOGASTRODUODENOSCOPY (EGD);  Surgeon: [place] Brace, MD;  Location: [name] [facility];  Service: Gastroenterology;  Laterality: N/A;    Family History  Problem Relation Age of Onset   Breast cancer Sister    Hypertension Other     Social History Social History   Tobacco Use   Smoking status: Former    Types: Cigarettes   Smokeless tobacco: Never   Tobacco comments:    Quit remotely 1970s  Vaping Use   Vaping Use: Never used  Substance Use Topics   Alcohol use: Not Currently   Drug use: Never    Allergies  Allergen Reactions   Acetaminophen-Codeine Other (See Comments)    Agitation   Codeine Other (See Comments)    hallucinations    Current Outpatient Medications  Medication Sig Dispense Refill   albuterol (VENTOLIN HFA) 108 (90 Base) MCG/ACT inhaler Inhale  1-2 puffs into the lungs every 6 (six) hours as needed for wheezing or shortness of breath. 8 g 6   alendronate (FOSAMAX) 70 MG tablet Take 70 mg by mouth every Saturday.     apixaban (ELIQUIS) 5 MG TABS tablet Take 5 mg by mouth 2 (two) times daily.     atorvastatin (LIPITOR) 20 MG tablet Take 20 mg by mouth every evening.     benzonatate (TESSALON) 200 MG capsule Take 200 mg by mouth daily as needed for cough.     Calcium-Magnesium-Vitamin D (CALCIUM 1200+D3 PO) Take 1 tablet by mouth in the morning.     DEMADEX 20 MG tablet Take 2 tablets (40 mg) by mouth daily every morning. Take an additional 2 tablets (40 mg) as needed for weight gain of 2 lb overnight or 5 lb in one week. 225 tablet 0   dextromethorphan-guaiFENesin (MUCINEX DM) 30-600 MG 12hr tablet Take 2 tablets by mouth 2 (two) times daily.     fluticasone furoate-vilanterol (BREO ELLIPTA) 200-25 MCG/INH AEPB Inhale 1 puff into the lungs daily. 1 each 3   ipratropium-albuterol (DUONEB) 0.5-2.5 (3) MG/3ML SOLN Inhale 3 mLs into the lungs every 6 (six) hours as needed (wheezing).     LORazepam (ATIVAN) 1 MG tablet Take 1 mg by mouth 2 (two) times daily.     metoprolol succinate (TOPROL-XL) 50 MG 24 hr tablet Take 50 mg by  mouth 2 (two) times daily.     mirtazapine (REMERON) 45 MG tablet Take 45 mg by mouth at bedtime.     OXYGEN Inhale 2 L into the lungs daily as needed (shortness of breath or activity level).     pantoprazole (PROTONIX) 20 MG tablet Take 1 tablet (20 mg total) by mouth daily. 30 tablet 5   potassium chloride (KLOR-CON) 10 MEQ tablet Take 2 tablets (20 mEq total) by mouth daily. 180 tablet 1   sertraline (ZOLOFT) 100 MG tablet Take 100 mg by mouth at bedtime.     Multiple Vitamin (MULTIVITAMIN WITH MINERALS) TABS tablet Take 1 tablet by mouth in the morning.     predniSONE (DELTASONE) 10 MG tablet Take 4 tablets (40 mg) daily for 2 days, then, Take 3 tablets (30 mg) daily for 2 days, then, Take 2 tablets (20 mg) daily for 2  days, then, Take 1 tablets (10 mg) daily for 1 days, then stop (Patient not taking: Reported on 09/28/2020) 19 tablet 0   umeclidinium bromide (INCRUSE ELLIPTA) 62.5 MCG/INH AEPB Inhale 1 puff into the lungs daily. (Patient not taking: Reported on 09/28/2020) 1 each 2   No current facility-administered medications for this visit.    Review of Systems Review of Systems  Constitutional: Negative.   Respiratory: Negative.    Genitourinary:  Negative for pelvic pain and vaginal discharge.       Urinary incontinence   Blood pressure 91/66, pulse 94, height '5\' 9"'$  (1.753 m), weight 213 lb (96.6 kg).  Physical Exam Physical Exam Vitals and nursing note reviewed.  Constitutional:      Appearance: She is obese.  Pulmonary:     Effort: Pulmonary effort is normal.  Neurological:     General: No focal deficit present.     Mental Status: She is alert and oriented to person, place, and time.  Psychiatric:        Mood and Affect: Mood normal.        Behavior: Behavior normal.    Data Reviewed Office notes [facility]  Assessment Urinary incontinence in female - Plan: Ambulatory referral to Urogynecology, mirabegron ER (MYRBETRIQ) 25 MG TB24 tablet H/o pap, vaginal polyp reported after hysterectomy for benign indication, no vaginal lesion seen by Dr. Reva [name]. She has difficulty f/u with [facility] and accepts referral to Dr. Wannetta Sender   Plan Orders Placed This Encounter  Procedures   Ambulatory referral to Urogynecology    Referral Priority:   Routine    Referral Type:   Consultation    Referral Reason:   Specialty Services Required    Requested Specialty:   Urology    Number of Visits Requested:   1    Recommend no more pap testing    [name] 09/28/2020, 10:10 AM

## 2020-10-01 ENCOUNTER — Other Ambulatory Visit (HOSPITAL_BASED_OUTPATIENT_CLINIC_OR_DEPARTMENT_OTHER): Payer: Self-pay | Admitting: Family

## 2020-10-01 ENCOUNTER — Telehealth: Payer: Self-pay

## 2020-10-01 NOTE — Telephone Encounter (Addendum)
Pt call transferred from the front office.   Pt and pt's daughter on the line with questions about her visit.  Pt's daughter reports that her mother came in a for a visit and nothing was done.  Per chart review, the office did not have referral notes so an exam was not completed however a referral was made to urogyn from our office.  I apologized to the pt that an exam was not done however it was best as we did not have the proper information to help her. I advised pt to please fill out an ROI so that we can call the office to obtain the records.  Pt reports that she does not drive and would prefer if I mailed her the ROI and then she would mail it back to our office.  I verified with pt if this is what she wanted to do as there is the possibility of the ROI getting lost in the mail and the time it takes to receive and send back to [place] that it would be a delay.  Both the pt and her daughter stated that it would be fine as they have already waited this long.  I encouraged to please f/u with [place] to make sure that we received the ROI.  Pt verbalized understanding.   Frances Nickels  10/13/20

## 2020-10-01 NOTE — Telephone Encounter (Signed)
Rx(s) sent to pharmacy electronically.  

## 2020-10-18 NOTE — Progress Notes (Signed)
[name], DNP, AGNP-c [facility] [address]  [name], [place] [address] [phone]  New patient visit   Patient: Lindsay [name]   DOB: 05/07/1940   80 y.o. Female  MRN: [id number] Visit Date: 10/21/2020  Patient Care Team: Shaday Rayborn, Coralee Pesa, NP as PCP - General (Nurse Practitioner) Pixie Casino, MD as PCP - Cardiology (Cardiology)  Today's healthcare provider: [name], NP   Chief Complaint  Patient presents with   Establish Care    Patient states she was hospitalized with congestion around her heart and lungs two weeks ago.  Patient report she may have a uti.  She is have urgency, burning and stinging and her reports her urine has an odor.   Subjective    Lindsay [name] is a 80 y.o. female who presents today as a new patient to establish care. She moved recently from [place] to [place] to be closer to her daughter. She lives at [facility] and reports she enjoys her home and being involved in the activities there.  She is on 2L [place] at baseline She walks with a rolling walker at all times She lives independently in the retirement community.  She goes to therapy three days a week and enjoys walking and exercising.  HPI  Dail tells me she was recently hospitalized for "fluid around her heart and lungs" and was started on torsemide 40mg  and potassium replacement at discharge. She tells me that since discharge she is feeling much better.  She was initially taken to the hospital because of breathing difficulties. She was hospitalized for 4 days.  She is oxygen dependent at baseline and is chronically on 2L [place]. She has not had need for any increase in oxygen therapy since coming home from the hospital.  She is short of breath with exertion at baseline, but tells me that she does not feel any more short of breath since coming home.  She denies lower extremity edema, chest pain, dizziness, or new cough.  She is monitoring her weight  daily and has not had a 2 pound weight gain overnight or 5 pound weight gain during a week span since coming home.  She is taking her medication as directed and denies any side effects other than increased urination.   She does report dysuria is present today. She endorses urgency, burning, stinging, and odor with urination.  She has had a UTI in the past and reports these are the symptoms she usually has.  She denies any back pain, abdominal pain, vomiting, diarrhea, or fevers.   She also reports congestion, rhinorrhea, "sinus headache", intermittent cough with occasional white mucous production, intermittent nausea, and sinus pain and pressure. She has had no exposure to COVID that she is aware of.  She denies fever, chills, decreased appetite, or worsening breathing.  Her symptoms started a few days ago.  This is her first autumn in [place].   She tells me she is on daily scheduled lorazepam BID. PRN dosing was ineffective for her, per patient.  She was recently started on mirabegron with urology She was also recently started on pantoprazole during her hospitalization.  Past Medical History:  Diagnosis Date   Asthma    Atrial fibrillation ([place])    Dysphagia 08/02/2020   Elevated troponin 09/14/2020   High cholesterol    HTN (hypertension)    Hyperkalemia 09/14/2020   Hyponatremia 08/02/2020   Low grade squamous intraepithelial lesion on cytologic smear of cervix (  LGSIL) 12/02/2019   Respiratory failure with hypoxia [facility])    Past Surgical History:  Procedure Laterality Date   CARDIOVERSION N/A 09/13/2020   Procedure: CARDIOVERSION;  Surgeon: [name], MD;  Location: [facility] ENDOSCOPY;  Service: Cardiovascular;  Laterality: N/A;   ESOPHAGOGASTRODUODENOSCOPY N/A 08/05/2020   Procedure: ESOPHAGOGASTRODUODENOSCOPY (EGD);  Surgeon: Otis Brace, MD;  Location: [facility] ENDOSCOPY;  Service: Gastroenterology;  Laterality: N/A;   Family Status  Relation Name Status   Sister  (Not Specified)    Other  (Not Specified)   Family History  Problem Relation Age of Onset   Breast cancer Sister    Hypertension Other    Social History   Socioeconomic History   Marital status: Divorced    Spouse name: Not on file   Number of children: Not on file   Years of education: Not on file   Highest education level: Not on file  Occupational History   Not on file  Tobacco Use   Smoking status: Former    Types: Cigarettes   Smokeless tobacco: Never   Tobacco comments:    Quit remotely 1970s  Vaping Use   Vaping Use: Never used  Substance and Sexual Activity   Alcohol use: Not Currently   Drug use: Never   Sexual activity: Not Currently  Other Topics Concern   Not on file  Social History Narrative   Not on file   Social Determinants of Health   Financial Resource Strain: Not on file  Food Insecurity: Not on file  Transportation Needs: Not on file  Physical Activity: Not on file  Stress: Not on file  Social Connections: Not on file   Outpatient Medications Prior to Visit  Medication Sig   [DISCONTINUED] LORazepam (ATIVAN) 1 MG tablet Take 1 tablet by mouth 2 (two) times daily as needed.   Calcium-Magnesium-Vitamin D (CALCIUM 1200+D3 PO) Take 1 tablet by mouth in the morning.   fluticasone furoate-vilanterol (BREO ELLIPTA) 200-25 MCG/INH AEPB Inhale 1 puff into the lungs daily.   mirabegron ER (MYRBETRIQ) 25 MG TB24 tablet Take 1 tablet (25 mg total) by mouth daily.   Multiple Vitamin (MULTIVITAMIN WITH MINERALS) TABS tablet Take 1 tablet by mouth in the morning.   OXYGEN Inhale 2 L into the lungs daily as needed (shortness of breath or activity level).   pantoprazole (PROTONIX) 20 MG tablet Take 1 tablet (20 mg total) by mouth daily.   potassium chloride (KLOR-CON) 10 MEQ tablet Take 2 tablets (20 mEq total) by mouth daily.   torsemide (DEMADEX) 20 MG tablet TAKE 2 TABLETS DAILY EVERY MORNING. TAKE AN ADDITIONAL 2 TABLETS AS NEEDED FOR WEIGHT GAIN OF 2 POUNDS OVERNIGHT OR 5  POUNDS IN ONE WEEK.   [DISCONTINUED] albuterol (VENTOLIN HFA) 108 (90 Base) MCG/ACT inhaler Inhale 1-2 puffs into the lungs every 6 (six) hours as needed for wheezing or shortness of breath.   [DISCONTINUED] alendronate (FOSAMAX) 70 MG tablet Take 70 mg by mouth every Saturday.   [DISCONTINUED] apixaban (ELIQUIS) 5 MG TABS tablet Take 5 mg by mouth 2 (two) times daily.   [DISCONTINUED] atorvastatin (LIPITOR) 20 MG tablet Take 20 mg by mouth every evening.   [DISCONTINUED] benzonatate (TESSALON) 200 MG capsule Take 200 mg by mouth daily as needed for cough.   [DISCONTINUED] dextromethorphan-guaiFENesin (MUCINEX DM) 30-600 MG 12hr tablet Take 2 tablets by mouth 2 (two) times daily.   [DISCONTINUED] ipratropium-albuterol (DUONEB) 0.5-2.5 (3) MG/3ML SOLN Inhale 3 mLs into the lungs every 6 (six) hours as needed (wheezing).   [  DISCONTINUED] LORazepam (ATIVAN) 1 MG tablet Take 1 mg by mouth 2 (two) times daily.   [DISCONTINUED] metoprolol succinate (TOPROL-XL) 50 MG 24 hr tablet Take 50 mg by mouth 2 (two) times daily.   [DISCONTINUED] mirtazapine (REMERON) 45 MG tablet Take 45 mg by mouth at bedtime.   [DISCONTINUED] predniSONE (DELTASONE) 10 MG tablet Take 4 tablets (40 mg) daily for 2 days, then, Take 3 tablets (30 mg) daily for 2 days, then, Take 2 tablets (20 mg) daily for 2 days, then, Take 1 tablets (10 mg) daily for 1 days, then stop (Patient not taking: Reported on 09/28/2020)   [DISCONTINUED] sertraline (ZOLOFT) 100 MG tablet Take 100 mg by mouth at bedtime.   [DISCONTINUED] umeclidinium bromide (INCRUSE ELLIPTA) 62.5 MCG/INH AEPB Inhale 1 puff into the lungs daily. (Patient not taking: Reported on 09/28/2020)   No facility-administered medications prior to visit.   Allergies  Allergen Reactions   Acetaminophen-Codeine Other (See Comments)    Agitation   Codeine Other (See Comments)    hallucinations    Immunization History  Administered Date(s) Administered   DTaP 08/06/1992   Moderna  SARS-COV2 Booster Vaccination 02/22/2020   Moderna Sars-Covid-2 Vaccination 02/14/2019, 03/14/2019   Pneumococcal Conjugate-13 02/05/2020   Td 10/21/2020   Zoster Recombinat (Shingrix) 02/23/2012    Health Maintenance  Topic Date Due   DEXA SCAN  Never done   Zoster Vaccines- Shingrix (2 of 2) 04/19/2012   COVID-19 Vaccine (4 - Booster for Moderna series) 06/21/2020   INFLUENZA VACCINE  12/06/2020 (Originally 08/23/2020)   TETANUS/TDAP  10/22/2030   HPV VACCINES  Aged Out    Patient Care Team: Sritha Chauncey, Coralee Pesa, NP as PCP - General (Nurse Practitioner) Pixie Casino, MD as PCP - Cardiology (Cardiology)  Review of Systems All review of systems negative except what is listed in the HPI    Objective    BP 112/72   Pulse 86   Ht 5\' 9"  (1.753 m)   Wt 241 lb (109.3 kg)   SpO2 96%   BMI 35.59 kg/m  Physical Exam Vitals and nursing note reviewed.  Constitutional:      General: She is not in acute distress.    Appearance: Normal appearance. She is obese.  HENT:     Head: Normocephalic.  Eyes:     Extraocular Movements: Extraocular movements intact.     Conjunctiva/sclera: Conjunctivae normal.     Pupils: Pupils are equal, round, and reactive to light.  Neck:     Vascular: No carotid bruit.  Cardiovascular:     Rate and Rhythm: Normal rate. Rhythm irregular.     Pulses: Normal pulses.     Heart sounds: Normal heart sounds. No murmur heard. Pulmonary:     Effort: Pulmonary effort is normal. No respiratory distress.     Breath sounds: Wheezing present.  Abdominal:     General: Bowel sounds are normal. There is no distension.     Palpations: Abdomen is soft.     Tenderness: There is no abdominal tenderness. There is no right CVA tenderness, left CVA tenderness or guarding.  Musculoskeletal:     Cervical back: Normal range of motion. No tenderness.     Right lower leg: No edema.     Left lower leg: No edema.  Skin:    General: Skin is warm and dry.     Capillary Refill:  Capillary refill takes less than 2 seconds.  Neurological:     General: No focal deficit present.  Mental Status: She is alert and oriented to person, place, and time.     Motor: Weakness present.     Gait: Gait abnormal.  Psychiatric:        Mood and Affect: Mood normal.        Behavior: Behavior normal.        Thought Content: Thought content normal.        Judgment: Judgment normal.     Depression Screen PHQ 2/9 Scores 10/21/2020  PHQ - 2 Score 2  PHQ- 9 Score 11   Results for orders placed or performed in visit on 10/21/20  POCT urinalysis dipstick  Result Value Ref Range   Color, UA yellow    Clarity, UA clear    Glucose, UA Negative Negative   Bilirubin, UA Negative    Ketones, UA Negative    Spec Grav, UA 1.015 1.010 - 1.025   Blood, UA Trace-lysed    pH, UA 7.0 5.0 - 8.0   Protein, UA Negative Negative   Urobilinogen, UA 0.2 0.2 or 1.0 E.U./dL   Nitrite, UA Positive    Leukocytes, UA Small (1+) (A) Negative   Appearance     Odor      Assessment & Plan      Problem List Items Addressed This Visit     Acute on chronic respiratory failure with hypoxia (HCC)    Recent exacerbation related to CHF exacerbation.  In hospital required 4L [place] titrated back to baseline of 2L. Appears to be doing well. No acute distress or alarm signs present.  Wheezes present bilaterally through lung fields.  Will send refill on nebulizer and recommend patient use this for the next few days given sinusitis symptoms present.  Details on symptoms that would warrant further evaluation discussed with patient.  Will obtain labs today.  Labs to be reviewed and changes made to plan of care as appropriate once resulted.       Relevant Medications   albuterol (VENTOLIN HFA) 108 (90 Base) MCG/ACT inhaler   ipratropium-albuterol (DUONEB) 0.5-2.5 (3) MG/3ML SOLN   dextromethorphan-guaiFENesin (MUCINEX DM) 30-600 MG 12hr tablet   Hypokalemia    Related to increased diuretic use On  replacement therapy at this time.  Will recheck labs today.  Continue potassium replacement until labs reviewed.  Labs to be reviewed and changes made to plan of care as appropriate once resulted.       Relevant Orders   Comprehensive metabolic panel   AF (paroxysmal atrial fibrillation) (HCC)    Irregular rhythm present today with normal rate.  No alarm symptoms present.  Chronically anticoagulated. No concerns at this time.       Relevant Medications   apixaban (ELIQUIS) 5 MG TABS tablet   atorvastatin (LIPITOR) 20 MG tablet   metoprolol succinate (TOPROL-XL) 50 MG 24 hr tablet   Chronic anticoagulation    Afib- rate controlled.  Chronically anticoagulated.  Will check labs today. No bleeding or alarm signs present.  Labs to be reviewed and changes made to plan of care as appropriate once resulted.       Relevant Orders   CBC with Differential/Platelet   Acute on chronic diastolic CHF (congestive heart failure) (HCC)    Recent exacerbation of congestive heart failure requiring 4 day hospitalization.  Appears to be improved today with no signs of fluid overload.  Wheezes auscultated without rhonchi present.  Will obtain labs for re-evaluation post hospitalization Continue torsemide and potassium replacement at this time.  Labs to be  reviewed and changes made to plan of care as appropriate once resulted.       Relevant Medications   apixaban (ELIQUIS) 5 MG TABS tablet   atorvastatin (LIPITOR) 20 MG tablet   metoprolol succinate (TOPROL-XL) 50 MG 24 hr tablet   Oxygen dependent    2L [place] at baseline.  Patient would like a portable O2 condenser, if possible to help when out of the house. The O2 tank she is using is heavy and difficult for her to manage on her own.  No increased O2 needs since d/c. Appears to be stable with chronic wheezing consistent with COPD diagnosis.  Will work to see about condenser for her.  Nebulizer and inhalers refilled.       Recurrent  major depressive disorder, in partial remission (HCC)    Currently stable on mirtazapine, sertraline, and ativan daily.  Patient has been prescribed these medications long term by previous provider.  Discussed concerns with daily use of benzodiazepines with patient, but she is not interested in reduction at this time.  Given long term use, will continue at this time to prevent exacerbation of symptoms. Benefits vs risks with use weighed- I would like to see her reduce her dosage due to increased risks of falls and respiratory depression given age and functional status. We will work on slow taper as tolerated.       Relevant Medications   LORazepam (ATIVAN) 1 MG tablet   mirtazapine (REMERON) 45 MG tablet   sertraline (ZOLOFT) 100 MG tablet   Acute non-recurrent pansinusitis    Symptoms consistent with sinusitis.  Given patients respiratory baseline and function will begin treatment [place] than usual to prevent worsening of respiratory symptoms.  Patient aware if symptoms worsen or fail to improve she will need to be seen for follow-up evaluation.       Relevant Medications   dextromethorphan-guaiFENesin (MUCINEX DM) 30-600 MG 12hr tablet   amoxicillin-clavulanate (AUGMENTIN) 875-125 MG tablet   GAD (generalized anxiety disorder)    See depression      Relevant Medications   LORazepam (ATIVAN) 1 MG tablet   mirtazapine (REMERON) 45 MG tablet   sertraline (ZOLOFT) 100 MG tablet   UTI symptoms    Positive UA today in office Will send for culture Augmentin prescribed today for sinusitis. Hopefully this will be dual effective. Labs to be reviewed and changes made to plan of care as appropriate once resulted.       Relevant Medications   amoxicillin-clavulanate (AUGMENTIN) 875-125 MG tablet   Other Relevant Orders   POCT urinalysis dipstick (Completed)   Urine Culture   Age-related osteoporosis without current pathological fracture - Primary   Relevant Medications   alendronate  (FOSAMAX) 70 MG tablet (Start on 10/23/2020)   Other Visit Diagnoses     Need for vaccination       Relevant Orders   Td : Tetanus/diphtheria >7yo Preservative  free (Completed)        Return for 2-3 months chronic conditions.     Time: 60 minutes, >50% spent counseling, care coordination, chart review, and documentation.   Gara Kincade, Coralee Pesa, NP, DNP, AGNP-C [facility] at [place]

## 2020-10-18 NOTE — Patient Instructions (Addendum)
Recommendations from today's visit: I have sent refills of your medications today to the pharmacy.  We will check labs today to make sure that you are not having any issues with the new medications.  I have sent an antibiotic to help with the sinus infection and urinary tract infection. Please let me know if this does not get better when you have finished the antibiotic.  Take the mucinex every day until the sinus infection is gone I would like to see you back in 2-3 months to recheck how you are doing.   Information on diet, exercise, and health maintenance recommendations are listed below. This is information to help you be sure you are on track for optimal health and monitoring.   Please look over this and let us know if you have any questions or if you have completed any of the health maintenance outside of [place] so that we can be sure your records are up to date.  ___________________________________________________________  Thank you for choosing [place] [facility] for your Primary Care needs. I am excited for the opportunity to partner with you to meet your health care goals. It was a pleasure meeting you today!  I am an Adult-Geriatric Nurse Practitioner with a background in caring for patients for more than 20 years. I provide primary care and sports medicine services to patients age 66 and older within this office. I am also the director of the APP Fellowship with [facility].   I am passionate about providing the best service to you through preventive medicine and supportive care. I consider you a part of the medical team and value your input. I work diligently to ensure that you are heard and your needs are met in a safe and effective manner. I want you to feel comfortable with me as your provider and want you to know that your health concerns are important to me.  For your information, our office hours are Monday- Friday 8:00 AM - 5:00 PM At this time  I am not in the office on Wednesdays.  If you have questions or concerns, please call our office at [phone] or send [place] a MyChart message and we will respond as quickly as possible.   For all urgent or time sensitive needs we ask that you please call the office to avoid delays. MyChart is not constantly monitored and replies may take up to 72 business hours.  MyChart Policy: MyChart allows for you to see your visit notes, after visit summary, provider recommendations, lab and tests results, make an appointment, request refills, and contact your provider or the office for non-urgent questions or concerns. Providers are seeing patients during normal business hours and do not have built in time to review MyChart messages.  We ask that you allow a minimum of 4 business days for responses to [employer]. For this reason, please do not send urgent requests through [place]. Please call the office at [phone]. Complex MyChart concerns may require a visit. Your provider may request you schedule a virtual or in person visit to ensure we are providing the best care possible. MyChart messages sent after 4:00 PM on Friday will not be received by the provider until Monday morning.    Lab and Test Results: You will receive your lab and test results on MyChart as soon as they are completed and results have been sent by the lab or testing facility. Due to this service, you will receive your results BEFORE your  provider.  I review lab and tests results each morning prior to seeing patients. Some results require collaboration with other providers to ensure you are receiving the most appropriate care. For this reason, we ask that you please allow a minimum of 4 business days for your provider to receive and review lab and test results and contact you about these.  Most lab and test result comments from the provider will be sent through [place]. Your provider may recommend changes to the plan of care,  follow-up visits, repeat testing, ask questions, or request an office visit to discuss these results. You may reply directly to this message or call the office at [phone] to provide information for the provider or set up an appointment. In some instances, you will be called with test results and recommendations. Please let us know if this is preferred and we will make note of this in your chart to provide this for you.    If you have not heard a response to your lab or test results in 72 business hours, please call the office to let us know.   After Hours: For all non-emergency after hours needs, please call the office at [phone] and select the option to reach the on-call provider service. On-call services are shared between multiple [place] offices and therefore it will not be possible to speak directly with your provider. On-call providers may provide medical advice and recommendations, but are unable to provide refills for maintenance medications.  For all emergency or urgent medical needs after normal business hours, we recommend that you seek care at the closest Urgent Care or Emergency Department to ensure appropriate treatment in a timely manner.  [facility] [place] has a 24 hour emergency room located on the ground floor for your convenience.    Please do not hesitate to reach out to [place] with concerns.   Thank you, again, for choosing me as your health care partner. I appreciate your trust and look forward to learning more about you.   Worthy Keeler, DNP, AGNP-c ___________________________________________________________  Health Maintenance Recommendations Screening Testing Mammogram Every 1 -2 years based on history and risk factors Starting at age 27 Pap Smear Ages 21-39 every 3 years Ages 42-65 every 5 years with HPV testing More frequent testing may be required based on results and history Colon Cancer Screening Every 1-10 years based on test  performed, risk factors, and history Starting at age 56 Bone Density Screening Every 2-10 years based on history Starting at age 71 for women Recommendations for men differ based on medication usage, history, and risk factors AAA Screening One time ultrasound Men 84-28 years old who have every smoked Lung Cancer Screening Low Dose Lung CT every 12 months Age 42-80 years with a 30 pack-year smoking history who still smoke or who have quit within the last 15 years  Screening Labs Routine  Labs: Complete Blood Count (CBC), Complete Metabolic Panel (CMP), Cholesterol (Lipid Panel) Every 6-12 months based on history and medications May be recommended more frequently based on current conditions or previous results Hemoglobin A1c Lab Every 3-12 months based on history and previous results Starting at age 11 or earlier with diagnosis of diabetes, high cholesterol, BMI >26, and/or risk factors Frequent monitoring for patients with diabetes to ensure blood sugar control Thyroid Panel (TSH w/ T3 & T4) Every 6 months based on history, symptoms, and risk factors May be repeated more often if on medication HIV One time testing for all patients 13  and older May be repeated more frequently for patients with increased risk factors or exposure Hepatitis C One time testing for all patients 33 and older May be repeated more frequently for patients with increased risk factors or exposure Gonorrhea, Chlamydia Every 12 months for all sexually active persons 13-24 years Additional monitoring may be recommended for those who are considered high risk or who have symptoms PSA Men 43-74 years old with risk factors Additional screening may be recommended from age 2-69 based on risk factors, symptoms, and history  Vaccine Recommendations Tetanus Booster All adults every 10 years Flu Vaccine All patients 6 months and older every year COVID Vaccine All patients 12 years and older Initial dosing with  booster May recommend additional booster based on age and health history HPV Vaccine 2 doses all patients age 66-26 Dosing may be considered for patients over 26 Shingles Vaccine (Shingrix) 2 doses all adults 56 years and older Pneumonia (Pneumovax 23) All adults 16 years and older May recommend earlier dosing based on health history Pneumonia (Prevnar 55) All adults 27 years and older Dosed 1 year after Pneumovax 23  Additional Screening, Testing, and Vaccinations may be recommended on an individualized basis based on family history, health history, risk factors, and/or exposure.  __________________________________________________________  Diet Recommendations for All Patients  I recommend that all patients maintain a diet low in saturated fats, carbohydrates, and cholesterol. While this can be challenging at first, it is not impossible and small changes can make big differences.  Things to try: Decreasing the amount of soda, sweet tea, and/or juice to one or less per day and replace with water While water is always the first choice, if you do not like water you may consider adding a water additive without sugar to improve the taste other sugar free drinks Replace potatoes with a brightly colored vegetable at dinner Use healthy oils, such as canola oil or olive oil, instead of butter or hard margarine Limit your bread intake to two pieces or less a day Replace regular pasta with low carb pasta options Bake, broil, or grill foods instead of frying Monitor portion sizes  Eat smaller, more frequent meals throughout the day instead of large meals  An important thing to remember is, if you love foods that are not great for your health, you don't have to give them up completely. Instead, allow these foods to be a reward when you have done well. Allowing yourself to still have special treats every once in a while is a nice way to tell yourself thank you for working hard to keep yourself  healthy.   Also remember that every day is a new day. If you have a bad day and "fall off the wagon", you can still climb right back up and keep moving along on your journey!  We have resources available to help you!  Some websites that may be helpful include: www.http://carter.biz/  Www.VeryWellFit.com _____________________________________________________________  Activity Recommendations for All Patients  I recommend that all adults get at least 20 minutes of moderate physical activity that elevates your heart rate at least 5 days out of the week.  Some examples include: Walking or jogging at a pace that allows you to carry on a conversation Cycling (stationary bike or outdoors) Water aerobics Yoga Weight lifting Dancing If physical limitations prevent you from putting stress on your joints, exercise in a pool or seated in a chair are excellent options.  Do determine your MAXIMUM heart rate for activity: YOUR AGE -  220 = MAX HeartRate   Remember! Do not push yourself too hard.  Start slowly and build up your pace, speed, weight, time in exercise, etc.  Allow your body to rest between exercise and get good sleep. You will need more water than normal when you are exerting yourself. Do not wait until you are thirsty to drink. Drink with a purpose of getting in at least 8, 8 ounce glasses of water a day plus more depending on how much you exercise and sweat.    If you begin to develop dizziness, chest pain, abdominal pain, jaw pain, shortness of breath, headache, vision changes, lightheadedness, or other concerning symptoms, stop the activity and allow your body to rest. If your symptoms are severe, seek emergency evaluation immediately. If your symptoms are concerning, but not severe, please let us know so that we can recommend further evaluation.   ________________________________________________________________

## 2020-10-20 ENCOUNTER — Ambulatory Visit: Payer: Medicare (Managed Care) | Admitting: Neurology

## 2020-10-21 ENCOUNTER — Other Ambulatory Visit: Payer: Self-pay

## 2020-10-21 ENCOUNTER — Encounter (HOSPITAL_BASED_OUTPATIENT_CLINIC_OR_DEPARTMENT_OTHER): Payer: Self-pay | Admitting: Nurse Practitioner

## 2020-10-21 ENCOUNTER — Ambulatory Visit (INDEPENDENT_AMBULATORY_CARE_PROVIDER_SITE_OTHER): Payer: Medicare (Managed Care) | Admitting: Nurse Practitioner

## 2020-10-21 VITALS — BP 112/72 | HR 86 | Ht 69.0 in | Wt 241.0 lb

## 2020-10-21 DIAGNOSIS — Z23 Encounter for immunization: Secondary | ICD-10-CM | POA: Diagnosis not present

## 2020-10-21 DIAGNOSIS — R399 Unspecified symptoms and signs involving the genitourinary system: Secondary | ICD-10-CM

## 2020-10-21 DIAGNOSIS — J0140 Acute pansinusitis, unspecified: Secondary | ICD-10-CM

## 2020-10-21 DIAGNOSIS — J9621 Acute and chronic respiratory failure with hypoxia: Secondary | ICD-10-CM

## 2020-10-21 DIAGNOSIS — Z7901 Long term (current) use of anticoagulants: Secondary | ICD-10-CM

## 2020-10-21 DIAGNOSIS — M810 Age-related osteoporosis without current pathological fracture: Secondary | ICD-10-CM | POA: Diagnosis not present

## 2020-10-21 DIAGNOSIS — E876 Hypokalemia: Secondary | ICD-10-CM

## 2020-10-21 DIAGNOSIS — F411 Generalized anxiety disorder: Secondary | ICD-10-CM

## 2020-10-21 DIAGNOSIS — I480 Paroxysmal atrial fibrillation: Secondary | ICD-10-CM | POA: Diagnosis not present

## 2020-10-21 DIAGNOSIS — Z9981 Dependence on supplemental oxygen: Secondary | ICD-10-CM

## 2020-10-21 DIAGNOSIS — F3341 Major depressive disorder, recurrent, in partial remission: Secondary | ICD-10-CM

## 2020-10-21 DIAGNOSIS — I5033 Acute on chronic diastolic (congestive) heart failure: Secondary | ICD-10-CM

## 2020-10-21 HISTORY — DX: Acute pansinusitis, unspecified: J01.40

## 2020-10-21 LAB — CBC WITH DIFFERENTIAL/PLATELET
Basophils Absolute: 0.1 10*3/uL (ref 0.0–0.2)
Basos: 1 %
EOS (ABSOLUTE): 0.1 10*3/uL (ref 0.0–0.4)
Eos: 1 %
Hematocrit: 39.5 % (ref 34.0–46.6)
Hemoglobin: 13 g/dL (ref 11.1–15.9)
Immature Grans (Abs): 0.1 10*3/uL (ref 0.0–0.1)
Immature Granulocytes: 1 %
Lymphocytes Absolute: 1.2 10*3/uL (ref 0.7–3.1)
Lymphs: 12 %
MCH: 31 pg (ref 26.6–33.0)
MCHC: 32.9 g/dL (ref 31.5–35.7)
MCV: 94 fL (ref 79–97)
Monocytes Absolute: 0.9 10*3/uL (ref 0.1–0.9)
Monocytes: 9 %
Neutrophils Absolute: 7.5 10*3/uL — ABNORMAL HIGH (ref 1.4–7.0)
Neutrophils: 76 %
Platelets: 320 10*3/uL (ref 150–450)
RBC: 4.19 x10E6/uL (ref 3.77–5.28)
RDW: 12.8 % (ref 11.7–15.4)
WBC: 9.7 10*3/uL (ref 3.4–10.8)

## 2020-10-21 LAB — COMPREHENSIVE METABOLIC PANEL
ALT: 15 IU/L (ref 0–32)
AST: 18 IU/L (ref 0–40)
Albumin/Globulin Ratio: 1.6 (ref 1.2–2.2)
Albumin: 4.2 g/dL (ref 3.7–4.7)
Alkaline Phosphatase: 104 IU/L (ref 44–121)
BUN/Creatinine Ratio: 16 (ref 12–28)
BUN: 16 mg/dL (ref 8–27)
Bilirubin Total: 0.4 mg/dL (ref 0.0–1.2)
CO2: 25 mmol/L (ref 20–29)
Calcium: 9.3 mg/dL (ref 8.7–10.3)
Chloride: 100 mmol/L (ref 96–106)
Creatinine, Ser: 0.98 mg/dL (ref 0.57–1.00)
Globulin, Total: 2.6 g/dL (ref 1.5–4.5)
Glucose: 70 mg/dL (ref 70–99)
Potassium: 4.2 mmol/L (ref 3.5–5.2)
Sodium: 141 mmol/L (ref 134–144)
Total Protein: 6.8 g/dL (ref 6.0–8.5)
eGFR: 58 mL/min/{1.73_m2} — ABNORMAL LOW (ref >59)

## 2020-10-21 LAB — POCT URINALYSIS DIPSTICK
Bilirubin, UA: NEGATIVE
Glucose, UA: NEGATIVE
Ketones, UA: NEGATIVE
Nitrite, UA: POSITIVE
Protein, UA: NEGATIVE
Spec Grav, UA: 1.015 (ref 1.010–1.025)
Urobilinogen, UA: 0.2 E.U./dL
pH, UA: 7 (ref 5.0–8.0)

## 2020-10-21 MED ORDER — DM-GUAIFENESIN ER 30-600 MG PO TB12: 2.0000 | ORAL_TABLET | Freq: Two times a day (BID) | ORAL | 3 refills | Status: DC

## 2020-10-21 MED ORDER — MIRTAZAPINE 45 MG PO TABS: 45.0000 mg | ORAL_TABLET | Freq: Every day | ORAL | 3 refills | Status: DC

## 2020-10-21 MED ORDER — IPRATROPIUM-ALBUTEROL 0.5-2.5 (3) MG/3ML IN SOLN: 3.0000 mL | Freq: Four times a day (QID) | RESPIRATORY_TRACT | 3 refills | Status: DC | PRN

## 2020-10-21 MED ORDER — AMOXICILLIN-POT CLAVULANATE 875-125 MG PO TABS: 1.0000 | ORAL_TABLET | Freq: Two times a day (BID) | ORAL | 0 refills | Status: DC

## 2020-10-21 MED ORDER — ATORVASTATIN CALCIUM 20 MG PO TABS: 20.0000 mg | ORAL_TABLET | Freq: Every evening | ORAL | 3 refills | Status: DC

## 2020-10-21 MED ORDER — METOPROLOL SUCCINATE ER 50 MG PO TB24: 50.0000 mg | ORAL_TABLET | Freq: Two times a day (BID) | ORAL | 3 refills | Status: DC

## 2020-10-21 MED ORDER — LORAZEPAM 1 MG PO TABS: 1.0000 mg | ORAL_TABLET | Freq: Two times a day (BID) | ORAL | 2 refills | Status: DC | PRN

## 2020-10-21 MED ORDER — ALENDRONATE SODIUM 70 MG PO TABS: 70.0000 mg | ORAL_TABLET | ORAL | 3 refills | Status: DC

## 2020-10-21 MED ORDER — SERTRALINE HCL 100 MG PO TABS: 100.0000 mg | ORAL_TABLET | Freq: Every day | ORAL | 3 refills | Status: DC

## 2020-10-21 MED ORDER — APIXABAN 5 MG PO TABS: 5.0000 mg | ORAL_TABLET | Freq: Two times a day (BID) | ORAL | 11 refills | Status: DC

## 2020-10-21 MED ORDER — ALBUTEROL SULFATE HFA 108 (90 BASE) MCG/ACT IN AERS: 1.0000 | INHALATION_SPRAY | Freq: Four times a day (QID) | RESPIRATORY_TRACT | 6 refills | Status: DC | PRN

## 2020-10-21 NOTE — Assessment & Plan Note (Signed)
Recent exacerbation related to CHF exacerbation.  In hospital required 4L [place] titrated back to baseline of 2L. Appears to be doing well. No acute distress or alarm signs present.  Wheezes present bilaterally through lung fields.  Will send refill on nebulizer and recommend patient use this for the next few days given sinusitis symptoms present.  Details on symptoms that would warrant further evaluation discussed with patient.  Will obtain labs today.  Labs to be reviewed and changes made to plan of care as appropriate once resulted.

## 2020-10-21 NOTE — Assessment & Plan Note (Signed)
>>  ASSESSMENT AND PLAN FOR RECURRENT MAJOR DEPRESSIVE DISORDER, IN PARTIAL REMISSION WRITTEN ON 10/21/2020  2:47 PM BY Latoya Maulding E, NP  Currently stable on mirtazapine , sertraline , and ativan  daily.  Patient has been prescribed these medications long term by previous provider.  Discussed concerns with daily use of benzodiazepines with patient, but she is not interested in reduction at this time.  Given long term use, will continue at this time to prevent exacerbation of symptoms. Benefits vs risks with use weighed- I would like to see her reduce her dosage due to increased risks of falls and respiratory depression given age and functional status. We will work on slow taper as tolerated.

## 2020-10-21 NOTE — Assessment & Plan Note (Signed)
Positive UA today in office Will send for culture Augmentin prescribed today for sinusitis. Hopefully this will be dual effective. Labs to be reviewed and changes made to plan of care as appropriate once resulted.

## 2020-10-21 NOTE — Assessment & Plan Note (Signed)
See depression

## 2020-10-21 NOTE — Assessment & Plan Note (Signed)
>>  ASSESSMENT AND PLAN FOR GENERALIZED ANXIETY DISORDER WRITTEN ON 10/21/2020  2:47 PM BY Marilu Rylander E, NP  See depression

## 2020-10-21 NOTE — Assessment & Plan Note (Signed)
2L [place] at baseline.  Patient would like a portable O2 condenser, if possible to help when out of the house. The O2 tank she is using is heavy and difficult for her to manage on her own.  No increased O2 needs since d/c. Appears to be stable with chronic wheezing consistent with COPD diagnosis.  Will work to see about condenser for her.  Nebulizer and inhalers refilled.

## 2020-10-21 NOTE — Assessment & Plan Note (Signed)
Currently stable on mirtazapine, sertraline, and ativan daily.  Patient has been prescribed these medications long term by previous provider.  Discussed concerns with daily use of benzodiazepines with patient, but Lindsay [name] is not interested in reduction at this time.  Given long term use, will continue at this time to prevent exacerbation of symptoms. Benefits vs risks with use weighed- I would like to see her reduce her dosage due to increased risks of falls and respiratory depression given age and functional status. We will work on slow taper as tolerated.

## 2020-10-21 NOTE — Assessment & Plan Note (Signed)
Recent exacerbation of congestive heart failure requiring 4 day hospitalization.  Appears to be improved today with no signs of fluid overload.  Wheezes auscultated without rhonchi present.  Will obtain labs for re-evaluation post hospitalization Continue torsemide and potassium replacement at this time.  Labs to be reviewed and changes made to plan of care as appropriate once resulted.

## 2020-10-21 NOTE — Assessment & Plan Note (Signed)
Irregular rhythm present today with normal rate.  No alarm symptoms present.  Chronically anticoagulated. No concerns at this time.

## 2020-10-21 NOTE — Assessment & Plan Note (Signed)
Symptoms consistent with sinusitis.  Given patients respiratory baseline and function will begin treatment Lindsay [name] than usual to prevent worsening of respiratory symptoms.  Patient aware if symptoms worsen or fail to improve she will need to be seen for follow-up evaluation.

## 2020-10-21 NOTE — Assessment & Plan Note (Signed)
>>  ASSESSMENT AND PLAN FOR AF (PAROXYSMAL ATRIAL FIBRILLATION) (HCC) WRITTEN ON 10/21/2020  2:33 PM BY Awilda Covin E, NP  Irregular rhythm present today with normal rate.  No alarm symptoms present.  Chronically anticoagulated. No concerns at this time.

## 2020-10-21 NOTE — Assessment & Plan Note (Signed)
Afib- rate controlled.  Chronically anticoagulated.  Will check labs today. No bleeding or alarm signs present.  Labs to be reviewed and changes made to plan of care as appropriate once resulted.

## 2020-10-21 NOTE — Assessment & Plan Note (Signed)
Related to increased diuretic use On replacement therapy at this time.  Will recheck labs today.  Continue potassium replacement until labs reviewed.  Labs to be reviewed and changes made to plan of care as appropriate once resulted.

## 2020-10-31 LAB — URINE CULTURE

## 2020-11-02 ENCOUNTER — Telehealth (HOSPITAL_BASED_OUTPATIENT_CLINIC_OR_DEPARTMENT_OTHER): Payer: Self-pay

## 2020-11-02 NOTE — Telephone Encounter (Signed)
Left patient a voicemail to call back to schedule a nurse visit for labs.

## 2020-11-03 ENCOUNTER — Telehealth (HOSPITAL_BASED_OUTPATIENT_CLINIC_OR_DEPARTMENT_OTHER): Payer: Self-pay

## 2020-11-03 DIAGNOSIS — R944 Abnormal results of kidney function studies: Secondary | ICD-10-CM

## 2020-11-03 NOTE — Telephone Encounter (Signed)
Per DPR left detailed message regarding lab results.  Informed patient she will need a nurse visit in 8 weeks to recheck labs.

## 2020-11-05 ENCOUNTER — Other Ambulatory Visit (HOSPITAL_BASED_OUTPATIENT_CLINIC_OR_DEPARTMENT_OTHER): Payer: Self-pay | Admitting: Nurse Practitioner

## 2020-11-11 ENCOUNTER — Ambulatory Visit (INDEPENDENT_AMBULATORY_CARE_PROVIDER_SITE_OTHER): Payer: Medicare (Managed Care) | Admitting: Nurse Practitioner

## 2020-11-11 ENCOUNTER — Other Ambulatory Visit: Payer: Self-pay

## 2020-11-11 ENCOUNTER — Encounter (HOSPITAL_BASED_OUTPATIENT_CLINIC_OR_DEPARTMENT_OTHER): Payer: Self-pay | Admitting: Nurse Practitioner

## 2020-11-11 VITALS — BP 112/66 | HR 82

## 2020-11-11 DIAGNOSIS — J441 Chronic obstructive pulmonary disease with (acute) exacerbation: Secondary | ICD-10-CM

## 2020-11-11 DIAGNOSIS — J449 Chronic obstructive pulmonary disease, unspecified: Secondary | ICD-10-CM | POA: Insufficient documentation

## 2020-11-11 MED ORDER — AZITHROMYCIN 250 MG PO TABS: ORAL_TABLET | ORAL | 0 refills | Status: DC

## 2020-11-11 MED ORDER — PREDNISONE 20 MG PO TABS: 40.0000 mg | ORAL_TABLET | Freq: Every day | ORAL | 0 refills | Status: DC

## 2020-11-11 NOTE — Patient Instructions (Signed)
Continue to take your Mucinex as directed.  Drink plenty of water and rest as much as possible.   You may want to consider a second COVID test in 2-3 days if you are still feeling poorly. Sometimes it takes more time for them to show up positive.   I would like your oxygen to stay between 90-95% while you are at rest.  If you are up and moving around and it drops down, sit down for a few minutes and then recheck to see that it comes up.  If your oxygen does not come back up to 90% with rest, I would like you to increase your oxygen by 1 liter (3L) and monitor.  If more than 4L is required to maintain oxygenation at 90-95% at rest, I would like to see you in person or for you to go to the hospital for evaluation.   If you have moved your oxygen up and then find that your saturations are above 95%, move it back down to 2L. Keep a minimum of 2L on even if your oxygen goes higher than 95% since this is your "baseline".  Call me with any questions or concerns.

## 2020-11-11 NOTE — Progress Notes (Signed)
Virtual Visit Encounter telephone visit.   I connected with  Lindsay [name] on 11/11/20 at  1:10 PM EDT by secure audio and/or video enabled telemedicine application. I verified that I am speaking with the correct person using two identifiers.   I introduced myself as a Designer, jewellery with the practice. The limitations of evaluation and management by telemedicine discussed with the patient and the availability of in person appointments. The patient expressed verbal understanding and consent to proceed.  Participating parties in this visit include: Myself and patient. The facility nurse is also present for some of the visit.   The patient is: Patient Location: Home I am: Provider Location: Office/Clinic Subjective:    CC and HPI: Lindsay [name] is a 80 y.o. year old female presenting for new evaluation and treatment of congestion and cough. She has a history of COPD and is oxygen dependant at baseline on 2L [place]. She lives in an assisted living facility and there is a COVID outbreak in the facility. She was tested this morning for COVID and it was negative. The nurse in the facility tested her today and has been monitoring her.  She reports a deep cough with congestion and mucous production that has been worse for the last 2 days. It was present prior to this, but not as bad. She is not requiring any additional oxygen at this time. Her O2 saturations are 92 at the time of the visit on O2 and sitting still. The nurse reports that her levels did drop down to 88 earlier when she was moving around, but have come back up.  She denies fever and chills. She is fatigued. She has been taking her mucinex to help, but it is not working like it usually does.   The nurse does not endorse any distress.   Past medical history, Surgical history, Family history not pertinant except as noted below, Social history, Allergies, and medications have been entered into the medical record, reviewed, and corrections made.    Review of Systems:  All review of systems negative except what is listed in the HPI  Objective:    Alert and oriented x 4 Speaking in clear sentences with no shortness of breath. She is audibly congested and has a wet sounding cough present.  No distress at this time Nurse is at bedside with direct observation.  Impression and Recommendations:    Problem List Items Addressed This Visit     COPD with acute exacerbation ([place]) - Primary    Symptoms and presentation consistent with acute COPD exacerbation. COVID test is negative. She may benefit from additional test in 2-3 days if symptoms do not improve.  Given her co-morbid conditions and overall health status, we will begin antibiotic therapy at this time as well as a steroid burst.  Discussed with patient and facility nurse to closely monitor O2 saturations with goal O2 90-95.  If saturations drop below 90 and do not rebound independently with pursed lip breathing and rest, OK to increase to [facility] and monitor.  If more than 4L [place] O2 required to maintain oxygenation at 90-95% at rest, instructed patient and nurse that she will need to be seen in person either in the office or at the hospital. They both expressed understanding of the recommendations.  She will follow-up if she is not feeling better or notices and new symptoms/changes.       Relevant Medications   azithromycin (ZITHROMAX) 250 MG tablet   predniSONE (DELTASONE) 20 MG tablet  orders and follow up as documented in EMR I discussed the assessment and treatment plan with the patient. The patient was provided an opportunity to ask questions and all were answered. The patient agreed with the plan and demonstrated an understanding of the instructions.   The patient was advised to call back or seek an in-person evaluation if the symptoms worsen or if the condition fails to improve as anticipated.  Follow-Up: 5 days with phone visit  I provided 18 minutes of  non-face-to-face interaction with this non face-to-face encounter including intake, same-day documentation, and chart review.   [name], NP , DNP, AGNP-c [place] [facility] [phone] (fax)

## 2020-11-11 NOTE — Assessment & Plan Note (Signed)
Symptoms and presentation consistent with acute COPD exacerbation. COVID test is negative. She may benefit from additional test in 2-3 days if symptoms do not improve.  Given her co-morbid conditions and overall health status, we will begin antibiotic therapy at this time as well as a steroid burst.  Discussed with patient and facility nurse to closely monitor O2 saturations with goal O2 90-95.  If saturations drop below 90 and do not rebound independently with pursed lip breathing and rest, OK to increase to [facility] and monitor.  If more than 4L [place] O2 required to maintain oxygenation at 90-95% at rest, instructed patient and nurse that she will need to be seen in person either in the office or at the hospital. They both expressed understanding of the recommendations.  She will follow-up if she is not feeling better or notices and new symptoms/changes.

## 2020-11-16 ENCOUNTER — Encounter (HOSPITAL_BASED_OUTPATIENT_CLINIC_OR_DEPARTMENT_OTHER): Payer: Self-pay | Admitting: Nurse Practitioner

## 2020-11-16 ENCOUNTER — Other Ambulatory Visit: Payer: Self-pay

## 2020-11-16 ENCOUNTER — Ambulatory Visit (INDEPENDENT_AMBULATORY_CARE_PROVIDER_SITE_OTHER): Payer: Medicare (Managed Care) | Admitting: Nurse Practitioner

## 2020-11-16 DIAGNOSIS — J029 Acute pharyngitis, unspecified: Secondary | ICD-10-CM

## 2020-11-16 DIAGNOSIS — J441 Chronic obstructive pulmonary disease with (acute) exacerbation: Secondary | ICD-10-CM | POA: Diagnosis not present

## 2020-11-16 HISTORY — DX: Acute pharyngitis, unspecified: J02.9

## 2020-11-16 MED ORDER — COVID-19 RAPID SELF TEST KIT VI KIT: PACK | 6 refills | Status: DC

## 2020-11-16 MED ORDER — COUGH DROPS 5.8 MG MT LOZG: 1.0000 | LOZENGE | OROMUCOSAL | 11 refills | Status: DC | PRN

## 2020-11-16 NOTE — Progress Notes (Deleted)
Called patient twice with no answer and left voicemail to call the office back

## 2020-11-16 NOTE — Assessment & Plan Note (Signed)
Symptoms much improved following antibiotic and steroid burst. No signs of infection present at this time.  Recommend continued monitoring for new or worsening symptoms.  COVID tests prescription sent to pharmacy so she will have them if she needs them in the future.  She will f/u if her symptoms return or new symptoms develop.

## 2020-11-16 NOTE — Progress Notes (Signed)
Virtual Visit Encounter telephone visit.   I connected with  Lindsay [name] on 11/16/20 at  3:30 PM EDT by secure audio and/or video enabled telemedicine application. I verified that I am speaking with the correct person using two identifiers.   I introduced myself as a Designer, jewellery with the practice. The limitations of evaluation and management by telemedicine discussed with the patient and the availability of in person appointments. The patient expressed verbal understanding and consent to proceed.  Participating parties in this visit include: Myself and patient  The patient is: Patient Location: Home I am: Provider Location: Office/Clinic Subjective:    CC and HPI: Lindsay [name] is a 80 y.o. year old female presenting for follow up of upper respiratory infection/COPD exacerbation.  Lindsay [name] tells me she is feeling much better and has completed her antibiotic and the steroid burst. She is not wheezing and denies shortness of breath, fever, chills, body aches.  She is having some sinus congestion and sore/dry throat, but she reports she stopped taking her mucinex while taking the antibiotic and this is common.   She would like to have a few more COVID tests because she used her supply testing while she has been sick. She lives in a retirement community and they have several residents who have tested positive for Lindsay [name]. All of her tests have been negative.   Past medical history, Surgical history, Family history not pertinant except as noted below, Social history, Allergies, and medications have been entered into the medical record, reviewed, and corrections made.   Review of Systems:  All review of systems negative except what is listed in the HPI  Objective:    Alert and oriented x 4 Speaking in clear sentences with no shortness of breath. No distress. Voice is hoarse.  Impression and Recommendations:    Problem List Items Addressed This Visit   None   orders and follow up as  documented in EMR I discussed the assessment and treatment plan with the patient. The patient was provided an opportunity to ask questions and all were answered. The patient agreed with the plan and demonstrated an understanding of the instructions.   The patient was advised to call back or seek an in-person evaluation if the symptoms worsen or if the condition fails to improve as anticipated.  Follow-Up: prn  I provided 20 minutes of non-face-to-face interaction with this non face-to-face encounter including intake, same-day documentation, and chart review.   [name], NP , DNP, AGNP-c [place] at [facility] [phone] (fax)

## 2020-11-16 NOTE — Assessment & Plan Note (Signed)
Dry sore throat following recent COPD exacerbation.  Likely related to sinus drainage as she has stopped her mucinex, which she takes daily. Recommend warm salt water gargles and throat lozenges to help along with increased fluid intake to help moisten the throat.  I will send a prescription for the lozenges to the pharmacy for her as she is unable to drive and her pharmacy will deliver these to her.  She will f/u if her symptoms worsen or new symptoms develop.

## 2020-11-16 NOTE — Patient Instructions (Signed)
I have sent the prescription for the COVID tests and the Cough Drops to the pharmacy. They should deliver those to you soon.   Let me know if you begin to feel bad again and please be safe.

## 2020-12-10 MED ORDER — TRELEGY ELLIPTA 100-62.5-25 MCG/ACT IN AEPB: 1.0000 | INHALATION_SPRAY | Freq: Every day | RESPIRATORY_TRACT | 11 refills | Status: AC

## 2020-12-23 ENCOUNTER — Ambulatory Visit: Payer: Medicare (Managed Care) | Admitting: Neurology

## 2020-12-23 ENCOUNTER — Encounter: Payer: Self-pay | Admitting: Neurology

## 2020-12-23 ENCOUNTER — Telehealth: Payer: Self-pay | Admitting: Neurology

## 2020-12-23 VITALS — BP 100/65 | HR 93 | Ht 67.0 in | Wt 241.6 lb

## 2020-12-23 DIAGNOSIS — M48062 Spinal stenosis, lumbar region with neurogenic claudication: Secondary | ICD-10-CM

## 2020-12-23 DIAGNOSIS — G6289 Other specified polyneuropathies: Secondary | ICD-10-CM

## 2020-12-23 DIAGNOSIS — G5711 Meralgia paresthetica, right lower limb: Secondary | ICD-10-CM | POA: Diagnosis not present

## 2020-12-23 DIAGNOSIS — R7309 Other abnormal glucose: Secondary | ICD-10-CM

## 2020-12-23 DIAGNOSIS — R200 Anesthesia of skin: Secondary | ICD-10-CM | POA: Diagnosis not present

## 2020-12-23 DIAGNOSIS — S32000A Wedge compression fracture of unspecified lumbar vertebra, initial encounter for closed fracture: Secondary | ICD-10-CM

## 2020-12-23 DIAGNOSIS — Z6841 Body Mass Index (BMI) 40.0 and over, adult: Secondary | ICD-10-CM

## 2020-12-23 NOTE — Patient Instructions (Addendum)
Obesity: Healthy weight and wellness: obesity - placed referral Suspect sleep apnea: Ask pulmonologist - witnessed apneic events  Neuropathy in feet:  Purple legs and swelling: Recommend primary care checking vascular studies such as ABI(Ankle-bracial index) or screen given her risk factors such as COPD. 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 It is improving so will MRI lumbar spine and monitor, not painful, not causing weakness. [address], [place], [place] [phone]. Send to PT for exercises to help with meralgia paresthetica.      Overview Meralgia paresthetica Meralgia parestheticaOpen pop-up dialog box Meralgia paresthetica (also known as lateral femoral cutaneous nerve entrapment) is a condition characterized by tingling, numbness and burning pain in your outer thigh. It's caused by compression of the nerve that provides sensation to the skin covering your thigh.  Tight clothing, obesity or weight gain, and pregnancy are common causes of meralgia paresthetica. However, meralgia paresthetica can also be due to local trauma or a disease, such as diabetes.  In most cases, you can relieve meralgia paresthetica with conservative measures, such as wearing looser clothing. In severe cases, treatment may include medications to relieve discomfort or, rarely, surgery.  Products & Services Book: [facility] Guide to Pain Relief Symptoms Meralgia paresthetica may cause these symptoms affecting the outer (lateral) part of your thigh:  Tingling and numbness Burning pain Decreased sensation Increased sensitivity and pain to even a light touch These symptoms commonly occur on one side of your body and might intensify after walking or standing.

## 2020-12-23 NOTE — Telephone Encounter (Signed)
[employer] medicare order sent to GI, they will look to see if a PA is require and reach out to the patient to schedule.

## 2020-12-23 NOTE — Progress Notes (Signed)
[facility]    Provider:  Dr Jaynee Eagles Requesting Provider: Derek Jack, MD Primary Care Provider:  [name], NP  CC:  leg numbness  HPI:  Lindsay [name] is a 80 y.o. female here as requested by Derek Jack, MD for leg numbness. PMHx asthma, afib on anticoag, copd on o2, hld, htn, resp failure with hypoxia. She was in the bed and didn't move, she barely feel right leg. Starts at the anterior thigh and radiates down the legs. She also has numbness under the toes. She has had multiple compression fractures. She has numbness in the balls of her feet for years. She has gained a lot of weight. Here with daughter who also provides information. No other focal neurologic deficits, associated symptoms, inciting events or modifiable factors.  Reviewed notes, labs and imaging from outside physicians, which showed: CT head 09/2020: FINDINGS:personally reviewed images and agree.  Brain:   Patchy and confluent areas of decreased attenuation are noted throughout the deep and periventricular white matter of the cerebral hemispheres bilaterally, compatible with chronic microvascular ischemic disease.   No evidence of large-territorial acute infarction. No parenchymal hemorrhage. No mass lesion. No extra-axial collection.   No mass effect or midline shift. No hydrocephalus. Basilar cisterns are patent.   Vascular: No hyperdense vessel.   Skull: No acute fracture or focal lesion.   Sinuses/Orbits: Paranasal sinuses and mastoid air cells are clear. The orbits are unremarkable.   Other: None.   IMPRESSION: No acute intracranial abnormality.    Review of Systems: Patient complains of symptoms per HPI as well as the following symptoms numbness. Pertinent negatives and positives per HPI. All others negative.   Social History   Socioeconomic History   Marital status: Divorced    Spouse name: Not on file   Number of children: Not on file   Years of education: Not  on file   Highest education level: Not on file  Occupational History   Not on file  Tobacco Use   Smoking status: Former    Types: Cigarettes   Smokeless tobacco: Never   Tobacco comments:    Quit remotely 1970s  Vaping Use   Vaping Use: Never used  Substance and Sexual Activity   Alcohol use: Not Currently   Drug use: Never   Sexual activity: Not Currently  Other Topics Concern   Not on file  Social History Narrative   Not on file   Social Determinants of Health   Financial Resource Strain: Not on file  Food Insecurity: Not on file  Transportation Needs: Not on file  Physical Activity: Not on file  Stress: Not on file  Social Connections: Not on file  Intimate Partner Violence: Not on file    Family History  Problem Relation Age of Onset   Breast cancer Sister    Hypertension Other     Past Medical History:  Diagnosis Date   Acute non-recurrent pansinusitis 10/21/2020   Asthma    Atrial fibrillation ([place])    Dysphagia 08/02/2020   Elevated troponin 09/14/2020   High cholesterol    HTN (hypertension)    Hyperkalemia 09/14/2020   Hyponatremia 08/02/2020   Low grade squamous intraepithelial lesion on cytologic smear of cervix (LGSIL) 12/02/2019   Respiratory failure with hypoxia [facility])     Patient Active Problem List   Diagnosis Date Noted   Sore throat 11/16/2020   COPD with acute exacerbation ([place]) 11/11/2020   GAD (generalized anxiety disorder) 10/21/2020   UTI  symptoms 10/21/2020   Urinary incontinence in female 09/28/2020   Acute on chronic diastolic CHF (congestive heart failure) ([place]) 09/14/2020   Persistent atrial fibrillation ([facility])    Achalasia of esophagus 33/29/5188   Acute diastolic CHF (congestive heart failure) ([place]) 08/03/2020   Acute on chronic respiratory failure with hypoxia ([facility]) 08/02/2020   Hypokalemia 08/02/2020   AF (paroxysmal atrial fibrillation) ([place]) 08/02/2020   Chronic anticoagulation 08/02/2020   Acquired thrombophilia ([place])  08/02/2020   Age-related osteoporosis without current pathological fracture 01/15/2020   Moderate persistent asthma without complication 41/66/0630   History of compression fracture of spine 12/02/2019   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   Morbid obesity with body mass index (BMI) of 40.0 or higher ([place]) 12/02/2019   Oxygen dependent 12/02/2019   Recurrent major depressive disorder, in partial remission ([place]) 12/02/2019   Vitamin D deficiency 12/02/2019    Past Surgical History:  Procedure Laterality Date   CARDIOVERSION N/A 09/13/2020   Procedure: CARDIOVERSION;  Surgeon: [name], MD;  Location: [facility] ENDOSCOPY;  Service: Cardiovascular;  Laterality: N/A;   ESOPHAGOGASTRODUODENOSCOPY N/A 08/05/2020   Procedure: ESOPHAGOGASTRODUODENOSCOPY (EGD);  Surgeon: Otis Brace, MD;  Location: [facility]OS[facility];  Service: Gastroenterology;  Laterality: N/A;    Current Outpatient Medications  Medication Sig Dispense Refill   albuterol (VENTOLIN HFA) 108 (90 Base) MCG/ACT inhaler Inhale 1-2 puffs into the lungs every 6 (six) hours as needed for wheezing or shortness of breath. 8 g 6   alendronate (FOSAMAX) 70 MG tablet Take 1 tablet (70 mg total) by mouth every Saturday. 12 tablet 3   apixaban (ELIQUIS) 5 MG TABS tablet Take 1 tablet (5 mg total) by mouth 2 (two) times daily. 60 tablet 11   atorvastatin (LIPITOR) 20 MG tablet Take 1 tablet (20 mg total) by mouth every evening. 90 tablet 3   Calcium-Magnesium-Vitamin D (CALCIUM 1200+D3 PO) Take 1 tablet by mouth in the morning.     COVID-19 At Home Antigen Test (COVID-19 RAPID SELF TEST KIT) KIT Use as directed. 3 kit 6   dextromethorphan-guaiFENesin (MUCINEX DM) 30-600 MG 12hr tablet Take 2 tablets by mouth 2 (two) times daily. 360 tablet 3   Fluticasone-Umeclidin-Vilant (TRELEGY ELLIPTA) 100-62.5-25 MCG/ACT AEPB Inhale 1 puff into the lungs daily. 28 each 11   ipratropium-albuterol (DUONEB) 0.5-2.5 (3)  MG/3ML SOLN Inhale 3 mLs into the lungs every 6 (six) hours as needed (wheezing). 360 mL 3   LORazepam (ATIVAN) 1 MG tablet Take 1 tablet (1 mg total) by mouth 2 (two) times daily as needed. 60 tablet 2   Menthol (COUGH DROPS) 5.8 MG LOZG Use as directed 1 lozenge (5.8 mg total) in the mouth or throat as needed (for dry, sore, or scratchy throat). 36 lozenge 11   metoprolol succinate (TOPROL-XL) 50 MG 24 hr tablet Take 1 tablet (50 mg total) by mouth 2 (two) times daily. 180 tablet 3   mirtazapine (REMERON) 45 MG tablet Take 1 tablet (45 mg total) by mouth at bedtime. 90 tablet 3   Multiple Vitamin (MULTIVITAMIN WITH MINERALS) TABS tablet Take 1 tablet by mouth in the morning.     OXYGEN Inhale 2 L into the lungs daily as needed (shortness of breath or activity level).     pantoprazole (PROTONIX) 20 MG tablet Take 1 tablet (20 mg total) by mouth daily. 30 tablet 5   potassium chloride (KLOR-CON) 10 MEQ tablet Take 2 tablets (20 mEq total) by mouth daily. 180 tablet 1   sertraline (  ZOLOFT) 100 MG tablet Take 1 tablet (100 mg total) by mouth at bedtime. 90 tablet 3   torsemide (DEMADEX) 20 MG tablet TAKE 2 TABLETS DAILY EVERY MORNING. TAKE AN ADDITIONAL 2 TABLETS AS NEEDED FOR WEIGHT GAIN OF 2 POUNDS OVERNIGHT OR 5 POUNDS IN ONE WEEK. 225 tablet 5   No current facility-administered medications for this visit.    Allergies as of 12/23/2020 - Review Complete 12/23/2020  Allergen Reaction Noted   Acetaminophen-codeine Other (See Comments) 12/02/2019   Codeine Other (See Comments) 09/14/2020    Vitals: BP 100/65   Pulse 93   Ht 5' 7"  (1.702 m)   Wt 241 lb 9.6 oz (109.6 kg)   BMI 37.84 kg/m  Last Weight:  Wt Readings from Last 1 Encounters:  12/23/20 241 lb 9.6 oz (109.6 kg)   Last Height:   Ht Readings from Last 1 Encounters:  12/23/20 5' 7"  (1.702 m)     Physical exam: Exam: Gen: NAD, conversant, well nourised, obese, well groomed                     CV: distant, not well heard.  No Carotid Bruits. mild peripheral edema, warm, nontender Eyes: Conjunctivae clear without exudates or hemorrhage  Neuro: Detailed Neurologic Exam  Speech:    Speech is normal; fluent and spontaneous with normal comprehension.  Cognition:    The patient is oriented to person, place, and time;     recent and remote memory intact;     language fluent;     normal attention, concentration,     fund of knowledge Cranial Nerves:    The pupils are equal, round, and reactive to light. Pupils too small to visualize fundi. Visual fields are full to finger confrontation. Extraocular movements are intact. Trigeminal sensation is intact and the muscles of mastication are normal. The face is symmetric. The palate elevates in the midline. Hearing intact. Voice is normal. Shoulder shrug is normal. The tongue has normal motion without fasciculations.   Coordination:    Normal    Gait:wide based with a walker   Motor Observation:    No asymmetry, no atrophy, and no involuntary movements noted. Tone:    Normal muscle tone.    Posture:    Posture is stooped    Strength: generalized non focal weaknes throughout    Strength is V/V in the upper and lower limbs.      Sensation: intact to LT     Reflex Exam:  DTR's:    2+ biceps, absent AJs, trace patellars    Toes:    The toes are downgoing bilaterally.   Clonus:    Clonus is absent.    Assessment/Plan:   80 y.o. female here as requested by Derek Jack, MD for leg numbness. PMHx asthma, afib on anticoag, copd on o2, hld, htn, resp failure with hypoxia.   Obesity: Healthy weight and wellness: obesity - placed referral Suspect sleep apnea: Ask pulmonologist - witnessed apneic events, may need sleep study  Neuropathy in feet:  Purple legs and swelling: Recommend primary care checking vascular studies such as ABI or screen given her risk factors such as COPD. 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 It is improving so will MRI lumbar spine and monitor, not painful, not causing weakness.But given her multiple compression fractures and worsened gait (unclear if neurogenic  claudication or due to shortness of breath) need MRI lumbar spine.  PT: [address], Building surveyor PT  Orders Placed This Encounter  Procedures   MR LUMBAR SPINE WO CONTRAST   Hemoglobin A1c   B12 and Folate Panel   Methylmalonic acid, serum   Vitamin B1   TSH   Vitamin B6   Multiple Myeloma Panel (SPEP&IFE w/QIG)   Ambulatory referral to [facility]   Ambulatory referral to Physical Therapy   No orders of the defined types were placed in this encounter.   Cc: Derek Jack, MD,  [place], Coralee Pesa, NP, Dr. Erin Fulling and Dr. Pleas Koch, [facility] [address] [place], [place] [address]  Phone [phone] Fax [phone]

## 2020-12-28 ENCOUNTER — Telehealth: Payer: Self-pay | Admitting: Neurology

## 2020-12-28 LAB — MULTIPLE MYELOMA PANEL, SERUM
Albumin SerPl Elph-Mcnc: 3.5 g/dL (ref 2.9–4.4)
Albumin/Glob SerPl: 1.2 (ref 0.7–1.7)
Alpha 1: 0.2 g/dL (ref 0.0–0.4)
Alpha2 Glob SerPl Elph-Mcnc: 0.7 g/dL (ref 0.4–1.0)
B-Globulin SerPl Elph-Mcnc: 1 g/dL (ref 0.7–1.3)
Gamma Glob SerPl Elph-Mcnc: 1.2 g/dL (ref 0.4–1.8)
Globulin, Total: 3.1 g/dL (ref 2.2–3.9)
IgA/Immunoglobulin A, Serum: 186 mg/dL (ref 64–422)
IgG (Immunoglobin G), Serum: 1277 mg/dL (ref 586–1602)
IgM (Immunoglobulin M), Srm: 196 mg/dL (ref 26–217)
M Protein SerPl Elph-Mcnc: 0.3 g/dL — ABNORMAL HIGH
Total Protein: 6.6 g/dL (ref 6.0–8.5)

## 2020-12-28 LAB — B12 AND FOLATE PANEL
Folate: 4.3 ng/mL (ref >3.0)
Vitamin B-12: 446 pg/mL (ref 232–1245)

## 2020-12-28 LAB — HEMOGLOBIN A1C
Est. average glucose Bld gHb Est-mCnc: 117 mg/dL
Hgb A1c MFr Bld: 5.7 % — ABNORMAL HIGH (ref 4.8–5.6)

## 2020-12-28 LAB — VITAMIN B6: Vitamin B6: 12 ug/L (ref 3.4–65.2)

## 2020-12-28 LAB — VITAMIN B1: Thiamine: 180.7 nmol/L (ref 66.5–200.0)

## 2020-12-28 LAB — TSH: TSH: 2.49 u[IU]/mL (ref 0.450–4.500)

## 2020-12-28 LAB — METHYLMALONIC ACID, SERUM: Methylmalonic Acid: 304 nmol/L (ref 0–378)

## 2020-12-28 NOTE — Progress Notes (Signed)
Please call patient: She is pre-diabetic. 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 This is an increase in a normal component of blood. 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 Monoclonal gammopathy of undetermined significance (MGUS) is a condition in which an abnormal protein - known as monoclonal protein or M protein - is in your blood. The protein is produced in a type of white blood cell (plasma cells) in your bone marrow. MGUS usually causes no problems. But sometimes it can progress over years to other disorders, including some forms of blood cancer. It's important to have regular checkups to closely monitor monoclonal gammopathy so that if it does progress, you get earlier treatment. If there's no disease progression, MGUS doesn't require treatment. Otherwise other labs fine. After you talk to patient please place a referral to hematology for M-protein spike thanks.

## 2020-12-28 NOTE — Telephone Encounter (Signed)
PT referral has been sent to [facility]. Phone: [phone]. Fax: [phone].

## 2020-12-30 ENCOUNTER — Telehealth: Payer: Self-pay | Admitting: *Deleted

## 2020-12-30 DIAGNOSIS — R899 Unspecified abnormal finding in specimens from other organs, systems and tissues: Secondary | ICD-10-CM

## 2020-12-30 NOTE — Telephone Encounter (Signed)
-----   Message from Melvenia Beam, MD sent at 12/28/2020  2:09 PM EST ----- Please call patient: She is pre-diabetic. 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 This is an increase in a normal component of blood. 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 Monoclonal gammopathy of undetermined significance (MGUS) is a condition in which an abnormal protein - known as monoclonal protein or M protein - is in your blood. The protein is produced in a type of white blood cell (plasma cells) in your bone marrow. MGUS usually causes no problems. But sometimes it can progress over years to other disorders, including some forms of blood cancer. It's important to have regular checkups to closely monitor monoclonal gammopathy so that if it does progress, you get earlier treatment. If there's no disease progression, MGUS doesn't require treatment. Otherwise other labs fine. After you talk to patient please place a referral to hematology for M-protein spike thanks.

## 2020-12-30 NOTE — Telephone Encounter (Signed)
Called pt & LVM (ok per DPR) asking for call back to discuss her lab results. Provided office hours and number in message.

## 2021-01-03 NOTE — Telephone Encounter (Addendum)
The patient returned my call.  We discussed the results as noted below by Dr. Jaynee Eagles.  Patient aware she has prediabetes with Hemoglobin A1C of 5.7.  Patient aware she also has an increase in a normal component of blood.  Dr. Jaynee Eagles would like her evaluated by hematologist to see if she has MGUS.  Patient aware if she does have this condition it would need to be monitored over the years to ensure it does not progress to other disorders including some forms of blood cancer.  Patient is amenable to a hematology referral.  I advised patient not to be alarmed when they call if they say they are from the cancer center as the hematologists work in the same building as the oncologists.  Patient was appreciative for the information and she stated she would have her daughter check her MyChart to review the results online.  She will await a call from the hematologist.  Referral placed to hematology for M-protein spike.

## 2021-01-03 NOTE — Addendum Note (Signed)
Addended by: Gildardo Griffes on: 01/03/2021 10:19 AM   Modules accepted: Orders

## 2021-01-04 ENCOUNTER — Encounter: Payer: Self-pay | Admitting: *Deleted

## 2021-01-04 NOTE — Progress Notes (Deleted)
New Hematology/Oncology Consult   Requesting MD: Dr. Delfin Edis  [phone]      Reason for Consult: Monoclonal gammopathy of undetermined significance  HPI: Lindsay [name] has been referred for evaluation of  monoclonal gammopathy of undetermined significance.  She was seen by Dr. Lavell Anchors for evaluation of leg numbness on 12/23/2020.  As part of that evaluation a multiple myeloma panel was obtained.  Serum IgG, IgA and IgM are all in normal range; 0.3 g/dl M spike; immunofixation showed IgG monoclonal protein with kappa light chain specificity.  Review of additional labs in the EMR from 10/21/2020: Hemoglobin 13.0, white count 9.7, platelet count 320,000, BUN 16, creatinine 0.98, calcium 9.3.  She has been referred for a lumbar spine MRI.   Past Medical History:  Diagnosis Date   Acute non-recurrent pansinusitis 10/21/2020   Asthma    Atrial fibrillation ([place])    Dysphagia 08/02/2020   Elevated troponin 09/14/2020   High cholesterol    HTN (hypertension)    Hyperkalemia 09/14/2020   Hyponatremia 08/02/2020   Low grade squamous intraepithelial lesion on cytologic smear of cervix (LGSIL) 12/02/2019   Respiratory failure with hypoxia ([place])   :   Past Surgical History:  Procedure Laterality Date   CARDIOVERSION N/A 09/13/2020   Procedure: CARDIOVERSION;  Surgeon: [name], MD;  Location: [facility] ENDOSCOPY;  Service: Cardiovascular;  Laterality: N/A;   ESOPHAGOGASTRODUODENOSCOPY N/A 08/05/2020   Procedure: ESOPHAGOGASTRODUODENOSCOPY (EGD);  Surgeon: Otis Brace, MD;  Location: [facility] ENDOSCOPY;  Service: Gastroenterology;  Laterality: N/A;  :   Current Outpatient Medications:    albuterol (VENTOLIN HFA) 108 (90 Base) MCG/ACT inhaler, Inhale 1-2 puffs into the lungs every 6 (six) hours as needed for wheezing or shortness of breath., Disp: 8 g, Rfl: 6   alendronate (FOSAMAX) 70 MG tablet, Take 1 tablet (70 mg total) by mouth every Saturday., Disp: 12 tablet, Rfl: 3   apixaban  (ELIQUIS) 5 MG TABS tablet, Take 1 tablet (5 mg total) by mouth 2 (two) times daily., Disp: 60 tablet, Rfl: 11   atorvastatin (LIPITOR) 20 MG tablet, Take 1 tablet (20 mg total) by mouth every evening., Disp: 90 tablet, Rfl: 3   Calcium-Magnesium-Vitamin D (CALCIUM 1200+D3 PO), Take 1 tablet by mouth in the morning., Disp: , Rfl:    COVID-19 At Home Antigen Test (COVID-19 RAPID SELF TEST KIT) KIT, Use as directed., Disp: 3 kit, Rfl: 6   dextromethorphan-guaiFENesin (MUCINEX DM) 30-600 MG 12hr tablet, Take 2 tablets by mouth 2 (two) times daily., Disp: 360 tablet, Rfl: 3   Fluticasone-Umeclidin-Vilant (TRELEGY ELLIPTA) 100-62.5-25 MCG/ACT AEPB, Inhale 1 puff into the lungs daily., Disp: 28 each, Rfl: 11   ipratropium-albuterol (DUONEB) 0.5-2.5 (3) MG/3ML SOLN, Inhale 3 mLs into the lungs every 6 (six) hours as needed (wheezing)., Disp: 360 mL, Rfl: 3   LORazepam (ATIVAN) 1 MG tablet, Take 1 tablet (1 mg total) by mouth 2 (two) times daily as needed., Disp: 60 tablet, Rfl: 2   Menthol (COUGH DROPS) 5.8 MG LOZG, Use as directed 1 lozenge (5.8 mg total) in the mouth or throat as needed (for dry, sore, or scratchy throat)., Disp: 36 lozenge, Rfl: 11   metoprolol succinate (TOPROL-XL) 50 MG 24 hr tablet, Take 1 tablet (50 mg total) by mouth 2 (two) times daily., Disp: 180 tablet, Rfl: 3   mirtazapine (REMERON) 45 MG tablet, Take 1 tablet (45 mg total) by mouth at bedtime., Disp: 90 tablet, Rfl: 3   Multiple Vitamin (MULTIVITAMIN WITH MINERALS) TABS tablet, Take 1  tablet by mouth in the morning., Disp: , Rfl:    OXYGEN, Inhale 2 L into the lungs daily as needed (shortness of breath or activity level)., Disp: , Rfl:    pantoprazole (PROTONIX) 20 MG tablet, Take 1 tablet (20 mg total) by mouth daily., Disp: 30 tablet, Rfl: 5   potassium chloride (KLOR-CON) 10 MEQ tablet, Take 2 tablets (20 mEq total) by mouth daily., Disp: 180 tablet, Rfl: 1   sertraline (ZOLOFT) 100 MG tablet, Take 1 tablet (100 mg total) by  mouth at bedtime., Disp: 90 tablet, Rfl: 3   torsemide (DEMADEX) 20 MG tablet, TAKE 2 TABLETS DAILY EVERY MORNING. TAKE AN ADDITIONAL 2 TABLETS AS NEEDED FOR WEIGHT GAIN OF 2 POUNDS OVERNIGHT OR 5 POUNDS IN ONE WEEK., Disp: 225 tablet, Rfl: 5:    Allergies  Allergen Reactions   Acetaminophen-Codeine Other (See Comments)    Agitation   Codeine Other (See Comments)    hallucinations    FH:  SOCIAL HISTORY:  Review of Systems:   Physical Exam:  There were no vitals taken for this visit.  HEENT: *** Lungs: *** Cardiac: *** Abdomen: *** GU: ***  Vascular: *** Lymph nodes: *** Neurologic: *** Skin: *** Musculoskeletal: ***  LABS:  No results for input(s): WBC, HGB, HCT, PLT in the last 72 hours.  No results for input(s): NA, K, CL, CO2, GLUCOSE, BUN, CREATININE, CALCIUM in the last 72 hours.    RADIOLOGY:  No results found.  Assessment and Plan:   ***    Ned Card, NP 01/04/2021, 2:38 PM

## 2021-01-05 ENCOUNTER — Ambulatory Visit: Payer: Medicare (Managed Care) | Admitting: Nurse Practitioner

## 2021-01-10 ENCOUNTER — Encounter: Payer: Self-pay | Admitting: Nurse Practitioner

## 2021-01-10 ENCOUNTER — Inpatient Hospital Stay: Payer: Medicare (Managed Care)

## 2021-01-10 ENCOUNTER — Inpatient Hospital Stay: Payer: Medicare (Managed Care) | Attending: Nurse Practitioner | Admitting: Nurse Practitioner

## 2021-01-10 ENCOUNTER — Ambulatory Visit (INDEPENDENT_AMBULATORY_CARE_PROVIDER_SITE_OTHER): Payer: Medicare (Managed Care) | Admitting: Nurse Practitioner

## 2021-01-10 ENCOUNTER — Encounter (HOSPITAL_BASED_OUTPATIENT_CLINIC_OR_DEPARTMENT_OTHER): Payer: Self-pay | Admitting: Nurse Practitioner

## 2021-01-10 ENCOUNTER — Other Ambulatory Visit: Payer: Self-pay

## 2021-01-10 VITALS — BP 110/68 | HR 81 | Temp 98.1°F | Resp 18 | Ht 67.0 in | Wt 242.0 lb

## 2021-01-10 VITALS — HR 101 | Ht 66.0 in | Wt 242.0 lb

## 2021-01-10 DIAGNOSIS — J069 Acute upper respiratory infection, unspecified: Secondary | ICD-10-CM

## 2021-01-10 DIAGNOSIS — I10 Essential (primary) hypertension: Secondary | ICD-10-CM

## 2021-01-10 DIAGNOSIS — Z7901 Long term (current) use of anticoagulants: Secondary | ICD-10-CM | POA: Insufficient documentation

## 2021-01-10 DIAGNOSIS — Z803 Family history of malignant neoplasm of breast: Secondary | ICD-10-CM | POA: Diagnosis not present

## 2021-01-10 DIAGNOSIS — I4891 Unspecified atrial fibrillation: Secondary | ICD-10-CM | POA: Insufficient documentation

## 2021-01-10 DIAGNOSIS — J9610 Chronic respiratory failure, unspecified whether with hypoxia or hypercapnia: Secondary | ICD-10-CM | POA: Diagnosis not present

## 2021-01-10 DIAGNOSIS — D472 Monoclonal gammopathy: Secondary | ICD-10-CM

## 2021-01-10 DIAGNOSIS — R200 Anesthesia of skin: Secondary | ICD-10-CM | POA: Insufficient documentation

## 2021-01-10 DIAGNOSIS — J441 Chronic obstructive pulmonary disease with (acute) exacerbation: Secondary | ICD-10-CM | POA: Diagnosis not present

## 2021-01-10 DIAGNOSIS — I110 Hypertensive heart disease with heart failure: Secondary | ICD-10-CM | POA: Diagnosis not present

## 2021-01-10 LAB — CBC WITH DIFFERENTIAL (CANCER CENTER ONLY)
Abs Immature Granulocytes: 0.03 10*3/uL (ref 0.00–0.07)
Basophils Absolute: 0 10*3/uL (ref 0.0–0.1)
Basophils Relative: 1 %
Eosinophils Absolute: 0.2 10*3/uL (ref 0.0–0.5)
Eosinophils Relative: 2 %
HCT: 39.8 % (ref 36.0–46.0)
Hemoglobin: 12.8 g/dL (ref 12.0–15.0)
Immature Granulocytes: 0 %
Lymphocytes Relative: 15 %
Lymphs Abs: 1 10*3/uL (ref 0.7–4.0)
MCH: 30.5 pg (ref 26.0–34.0)
MCHC: 32.2 g/dL (ref 30.0–36.0)
MCV: 94.8 fL (ref 80.0–100.0)
Monocytes Absolute: 0.5 10*3/uL (ref 0.1–1.0)
Monocytes Relative: 8 %
Neutro Abs: 5.2 10*3/uL (ref 1.7–7.7)
Neutrophils Relative %: 74 %
Platelet Count: 273 10*3/uL (ref 150–400)
RBC: 4.2 MIL/uL (ref 3.87–5.11)
RDW: 13.2 % (ref 11.5–15.5)
WBC Count: 7 10*3/uL (ref 4.0–10.5)
nRBC: 0 % (ref 0.0–0.2)

## 2021-01-10 LAB — CMP (CANCER CENTER ONLY)
ALT: 12 U/L (ref 0–44)
AST: 16 U/L (ref 15–41)
Albumin: 4 g/dL (ref 3.5–5.0)
Alkaline Phosphatase: 78 U/L (ref 38–126)
Anion gap: 6 (ref 5–15)
BUN: 20 mg/dL (ref 8–23)
CO2: 36 mmol/L — ABNORMAL HIGH (ref 22–32)
Calcium: 9.7 mg/dL (ref 8.9–10.3)
Chloride: 98 mmol/L (ref 98–111)
Creatinine: 0.88 mg/dL (ref 0.44–1.00)
GFR, Estimated: >60 mL/min (ref >60)
Glucose, Bld: 85 mg/dL (ref 70–99)
Potassium: 3.9 mmol/L (ref 3.5–5.1)
Sodium: 140 mmol/L (ref 135–145)
Total Bilirubin: 0.5 mg/dL (ref 0.3–1.2)
Total Protein: 7.1 g/dL (ref 6.5–8.1)

## 2021-01-10 MED ORDER — PREDNISONE 50 MG PO TABS: 50.0000 mg | ORAL_TABLET | Freq: Every day | ORAL | 0 refills | Status: DC

## 2021-01-10 MED ORDER — AMOXICILLIN-POT CLAVULANATE 875-125 MG PO TABS: 1.0000 | ORAL_TABLET | Freq: Two times a day (BID) | ORAL | 0 refills | Status: DC

## 2021-01-10 NOTE — Progress Notes (Signed)
Acute Office Visit  Subjective:    Patient ID: Lindsay [name], female    DOB: Sep 07, 1940, 80 y.o.   MRN: [id number]  Chief Complaint  Patient presents with   Acute Visit    Patient was down stairs being seen in the cancer center and they called [name]. Patient has runny nose and congestion x 2 days. Patient stated she just feels bad overall. She is taking OTC mucinex and it helps just not lasting long enough.     HPI Patient is in today for general feeling of unwell for the last 3 days. She endorses cough with increased mucous production, headache, rhinorrhea, congestion in her throat, increased ShOB, and wheezing.  She has not had to increase her O2 and sats are maintaining.  She denies sore throat, fever, chills, sweats, GI distress, sinus pain or pressure, ear pain or pressure.  She was seen earlier today at the cancer center and they sent her here for evaluation for her symptoms.  She is using mucinex, which is helpful for her symptoms, but not completely resolving them.  She is currently using Trelegy Ellipta daily.    Past Medical History:  Diagnosis Date   Acute diastolic CHF (congestive heart failure) ([facility]) 08/03/2020   Acute non-recurrent pansinusitis 10/21/2020   Asthma    Atrial fibrillation ([facility])    Dysphagia 08/02/2020   Elevated troponin 09/14/2020   High cholesterol    HTN (hypertension)    Hyperkalemia 09/14/2020   Hyponatremia 08/02/2020   Low grade squamous intraepithelial lesion on cytologic smear of cervix (LGSIL) 12/02/2019   Respiratory failure with hypoxia [facility])     Past Surgical History:  Procedure Laterality Date   CARDIOVERSION N/A 09/13/2020   Procedure: CARDIOVERSION;  Surgeon: [name], MD;  Location: [facility] ENDOSCOPY;  Service: Cardiovascular;  Laterality: N/A;   ESOPHAGOGASTRODUODENOSCOPY N/A 08/05/2020   Procedure: ESOPHAGOGASTRODUODENOSCOPY (EGD);  Surgeon: Otis Brace, MD;  Location: [facility] ENDOSCOPY;  Service: Gastroenterology;  Laterality:  N/A;    Family History  Problem Relation Age of Onset   Breast cancer Sister    Hypertension Other     Social History   Socioeconomic History   Marital status: Divorced    Spouse name: Not on file   Number of children: Not on file   Years of education: Not on file   Highest education level: Not on file  Occupational History   Not on file  Tobacco Use   Smoking status: Former    Types: Cigarettes   Smokeless tobacco: Never   Tobacco comments:    Quit remotely 1970s  Vaping Use   Vaping Use: Never used  Substance and Sexual Activity   Alcohol use: Not Currently   Drug use: Never   Sexual activity: Not Currently  Other Topics Concern   Not on file  Social History Narrative   Not on file   Social Determinants of Health   Financial Resource Strain: Not on file  Food Insecurity: Not on file  Transportation Needs: Not on file  Physical Activity: Not on file  Stress: Not on file  Social Connections: Not on file  Intimate Partner Violence: Not on file    Outpatient Medications Prior to Visit  Medication Sig Dispense Refill   albuterol (VENTOLIN HFA) 108 (90 Base) MCG/ACT inhaler Inhale 1-2 puffs into the lungs every 6 (six) hours as needed for wheezing or shortness of breath. 8 g 6   alendronate (FOSAMAX) 70 MG tablet Take 1 tablet (70 mg total) by mouth  every Saturday. 12 tablet 3   apixaban (ELIQUIS) 5 MG TABS tablet Take 1 tablet (5 mg total) by mouth 2 (two) times daily. 60 tablet 11   atorvastatin (LIPITOR) 20 MG tablet Take 1 tablet (20 mg total) by mouth every evening. 90 tablet 3   Calcium-Magnesium-Vitamin D (CALCIUM 1200+D3 PO) Take 1 tablet by mouth in the morning.     COVID-19 At Home Antigen Test (COVID-19 RAPID SELF TEST KIT) KIT Use as directed. (Patient not taking: Reported on 01/10/2021) 3 kit 6   dextromethorphan-guaiFENesin (MUCINEX DM) 30-600 MG 12hr tablet Take 2 tablets by mouth 2 (two) times daily. 360 tablet 3   ipratropium-albuterol (DUONEB)  0.5-2.5 (3) MG/3ML SOLN Inhale 3 mLs into the lungs every 6 (six) hours as needed (wheezing). 360 mL 3   LORazepam (ATIVAN) 1 MG tablet Take 1 tablet (1 mg total) by mouth 2 (two) times daily as needed. 60 tablet 2   Menthol (COUGH DROPS) 5.8 MG LOZG Use as directed 1 lozenge (5.8 mg total) in the mouth or throat as needed (for dry, sore, or scratchy throat). 36 lozenge 11   metoprolol succinate (TOPROL-XL) 50 MG 24 hr tablet Take 1 tablet (50 mg total) by mouth 2 (two) times daily. 180 tablet 3   mirtazapine (REMERON) 45 MG tablet Take 1 tablet (45 mg total) by mouth at bedtime. 90 tablet 3   Multiple Vitamin (MULTIVITAMIN WITH MINERALS) TABS tablet Take 1 tablet by mouth in the morning.     OXYGEN Inhale 2 L into the lungs daily as needed (shortness of breath or activity level).     pantoprazole (PROTONIX) 20 MG tablet Take 1 tablet (20 mg total) by mouth daily. 30 tablet 5   potassium chloride (KLOR-CON) 10 MEQ tablet Take 2 tablets (20 mEq total) by mouth daily. 180 tablet 1   sertraline (ZOLOFT) 100 MG tablet Take 1 tablet (100 mg total) by mouth at bedtime. 90 tablet 3   torsemide (DEMADEX) 20 MG tablet TAKE 2 TABLETS DAILY EVERY MORNING. TAKE AN ADDITIONAL 2 TABLETS AS NEEDED FOR WEIGHT GAIN OF 2 POUNDS OVERNIGHT OR 5 POUNDS IN ONE WEEK. 225 tablet 5   No facility-administered medications prior to visit.    Allergies  Allergen Reactions   Acetaminophen-Codeine Other (See Comments)    Agitation   Codeine Other (See Comments)    hallucinations    Review of Systems  Constitutional:  Positive for appetite change and fatigue. Negative for chills and fever.  HENT:  Positive for congestion, postnasal drip and rhinorrhea. Negative for ear pain, nosebleeds, sinus pressure, sinus pain, sore throat and tinnitus.   Respiratory:  Positive for cough, shortness of breath and wheezing. Negative for chest tightness and stridor.   Cardiovascular:  Negative for chest pain, palpitations and leg  swelling.  Gastrointestinal:  Negative for abdominal pain, constipation, diarrhea and nausea.  Musculoskeletal:  Negative for myalgias.  Neurological:  Positive for weakness. Negative for dizziness and light-headedness.      Objective:    Physical Exam Vitals and nursing note reviewed.  Constitutional:      Appearance: She is ill-appearing.  HENT:     Head: Normocephalic.     Right Ear: Tympanic membrane normal.     Left Ear: Tympanic membrane normal.     Nose: Congestion and rhinorrhea present.     Mouth/Throat:     Mouth: Mucous membranes are moist.     Pharynx: Posterior oropharyngeal erythema present.  Eyes:     Extraocular  Movements: Extraocular movements intact.     Conjunctiva/sclera: Conjunctivae normal.     Pupils: Pupils are equal, round, and reactive to light.  Neck:     Vascular: No carotid bruit.  Cardiovascular:     Rate and Rhythm: Regular rhythm. Tachycardia present.     Pulses: Normal pulses.     Comments: Distant heart sounds Pulmonary:     Breath sounds: Wheezing and rhonchi present.     Comments: Left lung- all fields.  Abdominal:     General: Bowel sounds are normal.     Palpations: Abdomen is soft.  Musculoskeletal:     Cervical back: Normal range of motion.     Right lower leg: No edema.     Left lower leg: No edema.  Lymphadenopathy:     Cervical: Cervical adenopathy present.  Skin:    General: Skin is warm and dry.     Capillary Refill: Capillary refill takes less than 2 seconds.  Neurological:     General: No focal deficit present.     Mental Status: She is alert and oriented to person, place, and time.     Motor: Weakness present.  Psychiatric:        Mood and Affect: Mood normal.        Behavior: Behavior normal.        Thought Content: Thought content normal.        Judgment: Judgment normal.    Pulse (!) 101    Ht '5\' 6"'  (1.676 m)    Wt 242 lb (109.8 kg)    SpO2 97%    BMI 39.06 kg/m  Wt Readings from Last 3 Encounters:  01/10/21  242 lb (109.8 kg)  01/10/21 242 lb (109.8 kg)  12/23/20 241 lb 9.6 oz (109.6 kg)    Health Maintenance Due  Topic Date Due   DEXA SCAN  Never done   Zoster Vaccines- Shingrix (2 of 2) 04/19/2012   COVID-19 Vaccine (3 - Moderna risk series) 03/21/2020   INFLUENZA VACCINE  Never done   Pneumonia Vaccine 53+ Years old (2 - PPSV23 if available, else PCV20) 02/04/2021    There are no preventive care reminders to display for this patient.   Lab Results  Component Value Date   TSH 2.490 12/23/2020   Lab Results  Component Value Date   WBC 7.0 01/10/2021   HGB 12.8 01/10/2021   HCT 39.8 01/10/2021   MCV 94.8 01/10/2021   PLT 273 01/10/2021   Lab Results  Component Value Date   NA 140 01/10/2021   K 3.9 01/10/2021   CO2 36 (H) 01/10/2021   GLUCOSE 85 01/10/2021   BUN 20 01/10/2021   CREATININE 0.88 01/10/2021   BILITOT 0.5 01/10/2021   ALKPHOS 78 01/10/2021   AST 16 01/10/2021   ALT 12 01/10/2021   PROT 7.1 01/10/2021   ALBUMIN 4.0 01/10/2021   CALCIUM 9.7 01/10/2021   ANIONGAP 6 01/10/2021   EGFR 58 (L) 10/21/2020   Lab Results  Component Value Date   CHOL 179 08/04/2020   Lab Results  Component Value Date   HDL 74 08/04/2020   Lab Results  Component Value Date   LDLCALC 92 08/04/2020   Lab Results  Component Value Date   TRIG 64 08/04/2020   Lab Results  Component Value Date   CHOLHDL 2.4 08/04/2020   Lab Results  Component Value Date   HGBA1C 5.7 (H) 12/23/2020       Assessment & Plan:   Problem  List Items Addressed This Visit     COPD with acute exacerbation ([place]) - Primary    Symptoms consistent with suspected URI and COPD exacerbation.  She appears ill today. Breathing is not labored, no signs of fluid overload present.  Left lung (all fields) positive for rhonchi and wheezing on both inspiration and expiration. Right lung sounds clear at this time. Recommend starting augmentin for COPD exacerbation and prednisone burst.  Strict close  monitoring of oxygen saturations and shortness of breath for need to increase oxygen to maintain sats >88%.  Continue to use Trelegy daily and may include duoneb during the day up to every 6 hours for acute symptoms of wheezing. Cautioned patient to use this minimally and monitor for elevated heart rate.  Encouraged her to monitor her fluid status and be sure she stays hydrated.  Monitor for new symptoms or fever and report immediately.  If she is not feeling better by Thursday morning, she will call the office.  Will test today for COVID, flu, and RSV. Unlikely this is the flu given lack of fever and chills, but it is possible.  Strict return and ED precautions given.  Will follow-up next Tuesday with phone visit as patient has difficulty transporting.       Relevant Medications   predniSONE (DELTASONE) 50 MG tablet   amoxicillin-clavulanate (AUGMENTIN) 875-125 MG tablet   Other Relevant Orders   COVID-19, Flu A+B and RSV   Other Visit Diagnoses     Upper respiratory tract infection, unspecified type       Relevant Medications   predniSONE (DELTASONE) 50 MG tablet   Other Relevant Orders   COVID-19, Flu A+B and RSV        Meds ordered this encounter  Medications   predniSONE (DELTASONE) 50 MG tablet    Sig: Take 1 tablet (50 mg total) by mouth daily. For delivery    Dispense:  5 tablet    Refill:  0   amoxicillin-clavulanate (AUGMENTIN) 875-125 MG tablet    Sig: Take 1 tablet by mouth 2 (two) times daily. For delivery    Dispense:  10 tablet    Refill:  0     [name], NP

## 2021-01-10 NOTE — Patient Instructions (Addendum)
I feel that you may have a viral infection that has triggered your COPD to get worse. Your left lung sounds very congested.   I am going to send in a prednisone burst for you to take once a day for 5 days and an antibiotic specific for your lungs.  Continue to use mucinex to help with symptoms.   If you feel more wheezy, you can use the DUONEB nebulizer solution every 6 hours as needed for symptoms. This might make your heart beat a little faster, but should help with the wheezing.   If you have to use more oxygen or you start feeling more short of breath I want you to call the office immediately. If it is bad, then I want you to go to the emergency room.   If you are not better at all by Thursday morning, please call the office and let us know.  We will plan for a phone call next week on Tuesday to make sure you are feeling better.

## 2021-01-10 NOTE — Progress Notes (Signed)
New Hematology/Oncology Consult   Requesting MD: Dr. Sarina Ill  [phone]  Reason for Consult: Monoclonal protein  HPI: Lindsay [name] has been referred for evaluation of monoclonal protein on SPEP.  She was seen by Dr. Lavell Anchors on 12/23/2020 for evaluation of leg numbness.  As part of that evaluation a myeloma panel was obtained.  She was noted to have a 0.3 g/dL M spike with IFE showing IgG monoclonal protein with kappa light chain specificity; quantitative immunoglobulins IgG, IgA and IgM all within normal range.  Labs from 10/21/2020-CBC hemoglobin 13.0 white count 9.7 platelet count 320,000; chemistry panel-BUN 16 creatinine 0.98, calcium 9.3.  She reports an approximate 67-monthhistory of right leg and bilateral foot numbness.  Onset of numbness occurred following a hospitalization in late August of this year.   Past Medical History:  Diagnosis Date   Acute non-recurrent pansinusitis 10/21/2020   Asthma    Atrial fibrillation ([place]    Dysphagia 08/02/2020   Elevated troponin 09/14/2020   High cholesterol    HTN (hypertension)    Hyperkalemia 09/14/2020   Hyponatremia 08/02/2020   Low grade squamous intraepithelial lesion on cytologic smear of cervix (LGSIL) 12/02/2019   Respiratory failure with hypoxia ([place]   :   Past Surgical History:  Procedure Laterality Date   CARDIOVERSION N/A 09/13/2020   Procedure: CARDIOVERSION;  Surgeon: AElouise Munroe MD;  [facility];  Service: Cardiovascular;  Laterality: N/A;   ESOPHAGOGASTRODUODENOSCOPY N/A 08/05/2020   Procedure: ESOPHAGOGASTRODUODENOSCOPY (EGD);  Surgeon: BOtis Brace MD;  [facility];  Service: Gastroenterology;  Laterality: N/A;  :   Current Outpatient Medications:    albuterol (VENTOLIN HFA) 108 (90 Base) MCG/ACT inhaler, Inhale 1-2 puffs into the lungs every 6 (six) hours as needed for wheezing or shortness of breath., Disp: 8 g, Rfl: 6   alendronate (FOSAMAX) 70 MG tablet, Take 1 tablet (70  mg total) by mouth every Saturday., Disp: 12 tablet, Rfl: 3   apixaban (ELIQUIS) 5 MG TABS tablet, Take 1 tablet (5 mg total) by mouth 2 (two) times daily., Disp: 60 tablet, Rfl: 11   atorvastatin (LIPITOR) 20 MG tablet, Take 1 tablet (20 mg total) by mouth every evening., Disp: 90 tablet, Rfl: 3   Calcium-Magnesium-Vitamin D (CALCIUM 1200+D3 PO), Take 1 tablet by mouth in the morning., Disp: , Rfl:    dextromethorphan-guaiFENesin (MUCINEX DM) 30-600 MG 12hr tablet, Take 2 tablets by mouth 2 (two) times daily., Disp: 360 tablet, Rfl: 3   ipratropium-albuterol (DUONEB) 0.5-2.5 (3) MG/3ML SOLN, Inhale 3 mLs into the lungs every 6 (six) hours as needed (wheezing)., Disp: 360 mL, Rfl: 3   LORazepam (ATIVAN) 1 MG tablet, Take 1 tablet (1 mg total) by mouth 2 (two) times daily as needed., Disp: 60 tablet, Rfl: 2   Menthol (COUGH DROPS) 5.8 MG LOZG, Use as directed 1 lozenge (5.8 mg total) in the mouth or throat as needed (for dry, sore, or scratchy throat)., Disp: 36 lozenge, Rfl: 11   metoprolol succinate (TOPROL-XL) 50 MG 24 hr tablet, Take 1 tablet (50 mg total) by mouth 2 (two) times daily., Disp: 180 tablet, Rfl: 3   mirtazapine (REMERON) 45 MG tablet, Take 1 tablet (45 mg total) by mouth at bedtime., Disp: 90 tablet, Rfl: 3   Multiple Vitamin (MULTIVITAMIN WITH MINERALS) TABS tablet, Take 1 tablet by mouth in the morning., Disp: , Rfl:    OXYGEN, Inhale 2 L into the lungs daily as needed (shortness of breath or activity level)., Disp: , Rfl:  pantoprazole (PROTONIX) 20 MG tablet, Take 1 tablet (20 mg total) by mouth daily., Disp: 30 tablet, Rfl: 5   potassium chloride (KLOR-CON) 10 MEQ tablet, Take 2 tablets (20 mEq total) by mouth daily., Disp: 180 tablet, Rfl: 1   sertraline (ZOLOFT) 100 MG tablet, Take 1 tablet (100 mg total) by mouth at bedtime., Disp: 90 tablet, Rfl: 3   torsemide (DEMADEX) 20 MG tablet, TAKE 2 TABLETS DAILY EVERY MORNING. TAKE AN ADDITIONAL 2 TABLETS AS NEEDED FOR WEIGHT GAIN  OF 2 POUNDS OVERNIGHT OR 5 POUNDS IN ONE WEEK., Disp: 225 tablet, Rfl: 5   COVID-19 At Home Antigen Test (COVID-19 RAPID SELF TEST KIT) KIT, Use as directed. (Patient not taking: Reported on 01/10/2021), Disp: 3 kit, Rfl: 6:    Allergies  Allergen Reactions   Acetaminophen-Codeine Other (See Comments)    Agitation   Codeine Other (See Comments)    hallucinations  :  FH: Sister with remote history of breast cancer.  Father deceased with a stroke.  Mother deceased with possible bowel obstruction.  SOCIAL HISTORY: She lives in [place] at [facility].  She moved here from [place] about a year ago to be closer to her daughter.  She is retired, previously employed as an Web designer.  No tobacco or alcohol use.  Review of Systems: She denies pain.  No fevers or sweats.  No bleeding.  She has a good appetite.  No weight loss.  She has had a headache for the past 2 days.  She woke up this morning with "congestion", cough, runny nose.  She wonders if she has a sinus infection.  As above, no fever.  No change in baseline dyspnea on exertion.  She has been maintained on supplemental oxygen for the past 3 years, continuous for the past 1 year.  No visual disturbance.  No dysphagia.  No nausea or vomiting.  No change in bowel habits.  Physical Exam:  Blood pressure 110/68, pulse 81, temperature 98.1 F (36.7 C), temperature source Oral, resp. rate 18, height '5\' 7"'  (1.702 m), weight 242 lb (109.8 kg), SpO2 98 %.  HEENT: No thrush or ulcers. Lungs: Distant breath sounds.  No respiratory distress. Cardiac: Distant heart sounds.  Irregular. Abdomen: Abdomen soft and nontender. Vascular: Pitting edema lower leg bilaterally. Lymph nodes: No palpable cervical, supraclavicular, axillary or inguinal lymph nodes. Neurologic: Alert and oriented. Skin: No rash.   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  This likely represents a monoclonal gammopathy of undetermined significance.  Dr. Benay Spice reviewed the diagnosis with her at today's visit.  She understands a monoclonal gammopathy of undetermined significance can progress to multiple myeloma or other lymphoproliferative malignancy.  There is no evidence of this at present.  She will return to the lab today for serum light chains, also baseline CBC and chemistry panel.  She will return for lab and follow-up in 6 months.  We will adjust her follow-up as needed pending lab results from today.   Patient seen with Dr. Benay Spice.  Ned Card, NP 01/10/2021, 12:46 PM   This was a shared visit with Ned Card.  Lindsay [name] was interviewed and examined.  She is referred  for evaluation of a serum monoclonal IgG kappa protein.  This was discovered during evaluation for leg numbness.  She appears to have a monoclonal gammopathy of unknown significance.  I have a low clinical suspicion for multiple myeloma or a lymphoproliferative disorder.  She has a normal hemoglobin and creatinine.  No lytic bone lesions were seen on a CT head or chest CT earlier this year.  She will return for an office visit in 6 months.  I was present for greater than 50% of today's visit.  I performed medical decision making.    Julieanne Manson, MD

## 2021-01-10 NOTE — Assessment & Plan Note (Addendum)
Symptoms consistent with suspected URI and COPD exacerbation.  She appears ill today. Breathing is not labored, no signs of fluid overload present.  Left lung (all fields) positive for rhonchi and wheezing on both inspiration and expiration. Right lung sounds clear at this time. Recommend starting augmentin for COPD exacerbation and prednisone burst.  Strict close monitoring of oxygen saturations and shortness of breath for need to increase oxygen to maintain sats >88%.  Continue to use Trelegy daily and may include duoneb during the day up to every 6 hours for acute symptoms of wheezing. Cautioned patient to use this minimally and monitor for elevated heart rate.  Encouraged her to monitor her fluid status and be sure she stays hydrated.  Monitor for new symptoms or fever and report immediately.  If she is not feeling better by Thursday morning, she will call the office.  Will test today for COVID, flu, and RSV. Unlikely this is the flu given lack of fever and chills, but it is possible.  Strict return and ED precautions given.  Will follow-up next Tuesday with phone visit as patient has difficulty transporting.

## 2021-01-11 ENCOUNTER — Telehealth: Payer: Self-pay | Admitting: Oncology

## 2021-01-11 ENCOUNTER — Ambulatory Visit: Payer: Medicare (Managed Care) | Admitting: Obstetrics and Gynecology

## 2021-01-11 LAB — KAPPA/LAMBDA LIGHT CHAINS
Kappa free light chain: 24.9 mg/L — ABNORMAL HIGH (ref 3.3–19.4)
Kappa, lambda light chain ratio: 1.61 (ref 0.26–1.65)
Lambda free light chains: 15.5 mg/L (ref 5.7–26.3)

## 2021-01-11 LAB — COVID-19, FLU A+B AND RSV
Influenza A, NAA: NOT DETECTED
Influenza B, NAA: NOT DETECTED
RSV, NAA: NOT DETECTED
SARS-CoV-2, NAA: NOT DETECTED

## 2021-01-11 NOTE — Telephone Encounter (Signed)
Scheduled appt per 12/19 los - left message for patient with appt date and time and sent reminder letter with appt date and time

## 2021-01-18 ENCOUNTER — Other Ambulatory Visit: Payer: Self-pay

## 2021-01-18 ENCOUNTER — Telehealth: Payer: Self-pay

## 2021-01-18 ENCOUNTER — Ambulatory Visit (INDEPENDENT_AMBULATORY_CARE_PROVIDER_SITE_OTHER): Payer: Medicare (Managed Care) | Admitting: Nurse Practitioner

## 2021-01-18 DIAGNOSIS — J441 Chronic obstructive pulmonary disease with (acute) exacerbation: Secondary | ICD-10-CM

## 2021-01-18 NOTE — Progress Notes (Signed)
Called patient with no answer: VM left Will plan to retry in AM

## 2021-01-18 NOTE — Telephone Encounter (Signed)
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  Follow-up as scheduled.

## 2021-01-18 NOTE — Telephone Encounter (Signed)
Called and spoke to patient, patient voiced understanding

## 2021-01-20 ENCOUNTER — Ambulatory Visit (HOSPITAL_BASED_OUTPATIENT_CLINIC_OR_DEPARTMENT_OTHER): Payer: Medicare (Managed Care) | Admitting: Nurse Practitioner

## 2021-02-01 ENCOUNTER — Telehealth: Payer: Self-pay | Admitting: *Deleted

## 2021-02-01 NOTE — Telephone Encounter (Signed)
Added longterm current drug therapy Z79.899. for [facility] needing addtitional diagnosis.

## 2021-02-04 ENCOUNTER — Other Ambulatory Visit (HOSPITAL_BASED_OUTPATIENT_CLINIC_OR_DEPARTMENT_OTHER): Payer: Self-pay | Admitting: Nurse Practitioner

## 2021-02-04 ENCOUNTER — Telehealth (HOSPITAL_BASED_OUTPATIENT_CLINIC_OR_DEPARTMENT_OTHER): Payer: Self-pay

## 2021-02-04 DIAGNOSIS — J069 Acute upper respiratory infection, unspecified: Secondary | ICD-10-CM

## 2021-02-04 DIAGNOSIS — J441 Chronic obstructive pulmonary disease with (acute) exacerbation: Secondary | ICD-10-CM

## 2021-02-04 MED ORDER — PREDNISONE 50 MG PO TABS: 50.0000 mg | ORAL_TABLET | Freq: Every day | ORAL | 0 refills | Status: DC

## 2021-02-04 MED ORDER — BENZONATATE 200 MG PO CAPS: 200.0000 mg | ORAL_CAPSULE | Freq: Three times a day (TID) | ORAL | 0 refills | Status: DC | PRN

## 2021-02-04 NOTE — Addendum Note (Signed)
Addended by: Latayvia Mandujano, Clarise Cruz E on: 02/04/2021 06:12 PM   Modules accepted: Orders

## 2021-02-04 NOTE — Telephone Encounter (Signed)
Patient called in this morning to report that she has had a "bad" cough for the last 3 days Patient states she has been tested for Covid and it was negative Patient states she is still using trelegy inhaler daily but seems to be having the same issues she was having last month with URI and COPD cough Patient inquired as to whether she needs to have an acute visit or if medication can be called in for her Will forward to [place] for advisement Patient verbalized understanding and is aware and agreeable

## 2021-02-05 ENCOUNTER — Emergency Department (HOSPITAL_BASED_OUTPATIENT_CLINIC_OR_DEPARTMENT_OTHER): Payer: Medicare HMO | Admitting: Radiology

## 2021-02-05 ENCOUNTER — Ambulatory Visit: Payer: Self-pay

## 2021-02-05 ENCOUNTER — Emergency Department (HOSPITAL_BASED_OUTPATIENT_CLINIC_OR_DEPARTMENT_OTHER): Payer: Medicare HMO

## 2021-02-05 ENCOUNTER — Emergency Department (HOSPITAL_BASED_OUTPATIENT_CLINIC_OR_DEPARTMENT_OTHER)
Admission: EM | Admit: 2021-02-05 | Discharge: 2021-02-05 | Disposition: A | Discharge status: Home / Self Care | Payer: Medicare HMO | Attending: Emergency Medicine | Admitting: Emergency Medicine

## 2021-02-05 ENCOUNTER — Other Ambulatory Visit: Payer: Self-pay

## 2021-02-05 ENCOUNTER — Encounter (HOSPITAL_BASED_OUTPATIENT_CLINIC_OR_DEPARTMENT_OTHER): Payer: Self-pay

## 2021-02-05 DIAGNOSIS — Z7901 Long term (current) use of anticoagulants: Secondary | ICD-10-CM | POA: Insufficient documentation

## 2021-02-05 DIAGNOSIS — R0602 Shortness of breath: Secondary | ICD-10-CM | POA: Insufficient documentation

## 2021-02-05 DIAGNOSIS — R600 Localized edema: Secondary | ICD-10-CM | POA: Diagnosis not present

## 2021-02-05 DIAGNOSIS — R051 Acute cough: Secondary | ICD-10-CM | POA: Diagnosis not present

## 2021-02-05 DIAGNOSIS — R059 Cough, unspecified: Secondary | ICD-10-CM | POA: Diagnosis present

## 2021-02-05 DIAGNOSIS — I4891 Unspecified atrial fibrillation: Secondary | ICD-10-CM | POA: Insufficient documentation

## 2021-02-05 DIAGNOSIS — J441 Chronic obstructive pulmonary disease with (acute) exacerbation: Secondary | ICD-10-CM

## 2021-02-05 DIAGNOSIS — Z20822 Contact with and (suspected) exposure to covid-19: Secondary | ICD-10-CM | POA: Insufficient documentation

## 2021-02-05 LAB — CBC WITH DIFFERENTIAL/PLATELET
Abs Immature Granulocytes: 0.01 10*3/uL (ref 0.00–0.07)
Basophils Absolute: 0 10*3/uL (ref 0.0–0.1)
Basophils Relative: 0 %
Eosinophils Absolute: 0.2 10*3/uL (ref 0.0–0.5)
Eosinophils Relative: 3 %
HCT: 40.2 % (ref 36.0–46.0)
Hemoglobin: 13 g/dL (ref 12.0–15.0)
Immature Granulocytes: 0 %
Lymphocytes Relative: 13 %
Lymphs Abs: 0.9 10*3/uL (ref 0.7–4.0)
MCH: 30.2 pg (ref 26.0–34.0)
MCHC: 32.3 g/dL (ref 30.0–36.0)
MCV: 93.5 fL (ref 80.0–100.0)
Monocytes Absolute: 0.5 10*3/uL (ref 0.1–1.0)
Monocytes Relative: 7 %
Neutro Abs: 5.6 10*3/uL (ref 1.7–7.7)
Neutrophils Relative %: 77 %
Platelets: 281 10*3/uL (ref 150–400)
RBC: 4.3 MIL/uL (ref 3.87–5.11)
RDW: 13.5 % (ref 11.5–15.5)
WBC: 7.3 10*3/uL (ref 4.0–10.5)
nRBC: 0 % (ref 0.0–0.2)

## 2021-02-05 LAB — COMPREHENSIVE METABOLIC PANEL
ALT: 12 U/L (ref 0–44)
AST: 17 U/L (ref 15–41)
Albumin: 3.9 g/dL (ref 3.5–5.0)
Alkaline Phosphatase: 81 U/L (ref 38–126)
Anion gap: 8 (ref 5–15)
BUN: 17 mg/dL (ref 8–23)
CO2: 33 mmol/L — ABNORMAL HIGH (ref 22–32)
Calcium: 9.3 mg/dL (ref 8.9–10.3)
Chloride: 98 mmol/L (ref 98–111)
Creatinine, Ser: 0.86 mg/dL (ref 0.44–1.00)
GFR, Estimated: >60 mL/min (ref >60)
Glucose, Bld: 100 mg/dL — ABNORMAL HIGH (ref 70–99)
Potassium: 3.2 mmol/L — ABNORMAL LOW (ref 3.5–5.1)
Sodium: 139 mmol/L (ref 135–145)
Total Bilirubin: 0.6 mg/dL (ref 0.3–1.2)
Total Protein: 7.3 g/dL (ref 6.5–8.1)

## 2021-02-05 LAB — RESP PANEL BY RT-PCR (FLU A&B, COVID) ARPGX2
Influenza A by PCR: NEGATIVE
Influenza B by PCR: NEGATIVE
SARS Coronavirus 2 by RT PCR: NEGATIVE

## 2021-02-05 LAB — BRAIN NATRIURETIC PEPTIDE: B Natriuretic Peptide: 209.1 pg/mL — ABNORMAL HIGH (ref 0.0–100.0)

## 2021-02-05 LAB — TROPONIN I (HIGH SENSITIVITY): Troponin I (High Sensitivity): 7 ng/L (ref <18)

## 2021-02-05 MED ORDER — IPRATROPIUM-ALBUTEROL 0.5-2.5 (3) MG/3ML IN SOLN
3.0000 mL | Freq: Once | RESPIRATORY_TRACT | Status: AC
  Administered 2021-02-05: 3 mL via RESPIRATORY_TRACT
  Filled 2021-02-05: qty 3

## 2021-02-05 MED ORDER — METHYLPREDNISOLONE SODIUM SUCC 125 MG IJ SOLR
125.0000 mg | Freq: Every day | INTRAMUSCULAR | Status: DC
  Administered 2021-02-05: 125 mg via INTRAMUSCULAR
  Filled 2021-02-05: qty 2

## 2021-02-05 MED ORDER — PREDNISONE 10 MG PO TABS: 40.0000 mg | ORAL_TABLET | Freq: Every day | ORAL | 0 refills | Status: DC

## 2021-02-05 MED ORDER — IPRATROPIUM BROMIDE 0.02 % IN SOLN
0.5000 mg | Freq: Once | RESPIRATORY_TRACT | Status: AC
Start: 2021-02-05 — End: 2021-02-05
  Administered 2021-02-05: 0.5 mg via RESPIRATORY_TRACT
  Filled 2021-02-05: qty 2.5

## 2021-02-05 MED ORDER — ALBUTEROL SULFATE (5 MG/ML) 0.5% IN NEBU
2.5000 mg | INHALATION_SOLUTION | Freq: Four times a day (QID) | RESPIRATORY_TRACT | 12 refills | Status: DC | PRN
Start: 2021-02-05 — End: 2021-05-09

## 2021-02-05 MED ORDER — PREDNISONE 10 MG PO TABS: 40.0000 mg | ORAL_TABLET | Freq: Every day | ORAL | 0 refills | Status: AC

## 2021-02-05 MED ORDER — ALBUTEROL SULFATE (5 MG/ML) 0.5% IN NEBU
2.5000 mg | INHALATION_SOLUTION | Freq: Four times a day (QID) | RESPIRATORY_TRACT | 12 refills | Status: DC | PRN
Start: 2021-02-05 — End: 2021-02-05

## 2021-02-05 MED ORDER — ALBUTEROL SULFATE (2.5 MG/3ML) 0.083% IN NEBU
5.0000 mg | INHALATION_SOLUTION | Freq: Once | RESPIRATORY_TRACT | Status: AC
Start: 2021-02-05 — End: 2021-02-05
  Administered 2021-02-05: 5 mg via RESPIRATORY_TRACT
  Filled 2021-02-05: qty 6

## 2021-02-05 MED ORDER — METHYLPREDNISOLONE SODIUM SUCC 125 MG IJ SOLR: 125.0000 mg | Freq: Once | INTRAMUSCULAR | Status: DC

## 2021-02-05 NOTE — ED Notes (Signed)
RT Note: Pt. wanted to use restroom after additional Duoneb aerosol, this RT placed extension on Oxygen, RN made aware, pt. to/from w/o difficulty, extension removed for safety and placed in tx. bag in room, made aware to notify if needed before daughter returns from [place], remains on 2 lpm n/c.

## 2021-02-05 NOTE — ED Notes (Signed)
Methylprednisolone given in Right buttock instead of right deltoid.

## 2021-02-05 NOTE — ED Notes (Signed)
Patient transported to CT 

## 2021-02-05 NOTE — ED Notes (Signed)
EMT-P provided AVS using Teachback Method. Patient verbalizes understanding of Discharge Instructions. Opportunity for Questioning and Answers were provided by EMT-P. Patient Discharged from ED.  ? ?

## 2021-02-05 NOTE — ED Provider Notes (Signed)
[place] EMERGENCY DEPT Provider Note   CSN: [id number] Arrival date & time: 02/05/21  1142     History  Chief Complaint  Patient presents with   Cough    Lindsay [name] is a 81 y.o. female.  Patient is an 81 year old female with a history of atrial fibrillation on Eliquis, respiratory hypoxic failure on chronic oxygen therapy, recurrent CHF on diuretics daily, hyponatremia who is presenting today with complaint of cough and some shortness of breath.  Patient's daughter gives majority of the history and reports that on Wednesday she was fine but she went to take some medicine Wednesday night and they were concerned she may have aspirated as she did choke when this occurred.  Since that time she has had a persistent wet cough and reports she feels like there is something stuck in her upper airways.  She has been using her duo nebs 4-6 times per day and has noted only minimal improvement.  Her oxygen saturation has been 90 to 91% on her home 2 L.  She does note that last week she had more swelling in her lower extremities and she was taking a double dose of her Lasix for the whole week but that did seem to improve and her leg swelling is improved from what it had been.  She has no chest pain, fever, abdominal pain or vomiting.  No known sick contacts.  The history is provided by the patient, medical records and a relative.  Cough     Home Medications Prior to Admission medications   Medication Sig Start Date End Date Taking? Authorizing Provider  albuterol (VENTOLIN HFA) 108 (90 Base) MCG/ACT inhaler Inhale 1-2 puffs into the lungs every 6 (six) hours as needed for wheezing or shortness of breath. 10/21/20   Early, Coralee Pesa, NP  alendronate (FOSAMAX) 70 MG tablet Take 1 tablet (70 mg total) by mouth every Saturday. 10/23/20   [name], NP  amoxicillin-clavulanate (AUGMENTIN) 875-125 MG tablet Take 1 tablet by mouth 2 (two) times daily. For delivery 01/10/21   Early, Coralee Pesa,  NP  apixaban (ELIQUIS) 5 MG TABS tablet Take 1 tablet (5 mg total) by mouth 2 (two) times daily. 10/21/20   [name], NP  atorvastatin (LIPITOR) 20 MG tablet Take 1 tablet (20 mg total) by mouth every evening. 10/21/20   Early, Coralee Pesa, NP  benzonatate (TESSALON) 200 MG capsule Take 1 capsule (200 mg total) by mouth 3 (three) times daily as needed for cough. 02/04/21   [name], NP  Calcium-Magnesium-Vitamin D (CALCIUM 1200+D3 PO) Take 1 tablet by mouth in the morning.    [provider]  COVID-19 At Home Antigen Test (COVID-19 RAPID SELF TEST KIT) KIT Use as directed. Patient not taking: Reported on 01/10/2021 11/16/20   Early, Coralee Pesa, NP  dextromethorphan-guaiFENesin [facility] DM) 30-600 MG 12hr tablet Take 2 tablets by mouth 2 (two) times daily. 10/21/20   [name], NP  ipratropium-albuterol (DUONEB) 0.5-2.5 (3) MG/3ML SOLN Inhale 3 mLs into the lungs every 6 (six) hours as needed (wheezing). 10/21/20   [name], NP  LORazepam (ATIVAN) 1 MG tablet Take 1 tablet (1 mg total) by mouth 2 (two) times daily as needed. 10/21/20   [name], NP  Menthol (COUGH DROPS) 5.8 MG LOZG Use as directed 1 lozenge (5.8 mg total) in the mouth or throat as needed (for dry, sore, or scratchy throat). 11/16/20   [name], NP  metoprolol succinate (  TOPROL-XL) 50 MG 24 hr tablet Take 1 tablet (50 mg total) by mouth 2 (two) times daily. 10/21/20   [name], NP  mirtazapine (REMERON) 45 MG tablet Take 1 tablet (45 mg total) by mouth at bedtime. 10/21/20   [name], NP  Multiple Vitamin (MULTIVITAMIN WITH MINERALS) TABS tablet Take 1 tablet by mouth in the morning.    [provider]  OXYGEN Inhale 2 L into the lungs daily as needed (shortness of breath or activity level).    [provider]  pantoprazole (PROTONIX) 20 MG tablet Take 1 tablet (20 mg total) by mouth daily. 08/24/20   Loel Dubonnet, NP  potassium chloride (KLOR-CON) 10 MEQ tablet Take 2 tablets (20  mEq total) by mouth daily. 08/24/20 02/20/21  Loel Dubonnet, NP  predniSONE (DELTASONE) 50 MG tablet Take 1 tablet (50 mg total) by mouth daily. For delivery 02/04/21   Early, Coralee Pesa, NP  sertraline (ZOLOFT) 100 MG tablet Take 1 tablet (100 mg total) by mouth at bedtime. 10/21/20   [name], NP  torsemide (DEMADEX) 20 MG tablet TAKE 2 TABLETS DAILY EVERY MORNING. TAKE AN ADDITIONAL 2 TABLETS AS NEEDED FOR WEIGHT GAIN OF 2 POUNDS OVERNIGHT OR 5 POUNDS IN ONE WEEK. 10/01/20   Loel Dubonnet, NP      Allergies    Acetaminophen-codeine and Codeine    Review of Systems   Review of Systems  Respiratory:  Positive for cough.    Physical Exam Updated Vital Signs BP 98/79 (BP Location: Right Arm)    Pulse 85    Temp 98.5 F (36.9 C) (Oral)    Resp 15    Ht _0  (1.727 m)    Wt 108.9 kg    SpO2 98%    BMI 36.49 kg/m  Physical Exam Vitals and nursing note reviewed.  Constitutional:      General: She is not in acute distress.    Appearance: She is well-developed.     Comments: Chronically ill-appearing elderly female  HENT:     Head: Normocephalic and atraumatic.     Mouth/Throat:     Mouth: Mucous membranes are moist.  Eyes:     Pupils: Pupils are equal, round, and reactive to light.  Cardiovascular:     Rate and Rhythm: Normal rate. Rhythm irregularly irregular.     Heart sounds: Normal heart sounds. No murmur heard.   No friction rub.  Pulmonary:     Effort: Pulmonary effort is normal.     Breath sounds: Wheezing and rhonchi present. No rales.     Comments: Wheezing and rhonchi present most prominently in the upper lobes but also present in the lower lobes Abdominal:     General: Bowel sounds are normal. There is no distension.     Palpations: Abdomen is soft.     Tenderness: There is no abdominal tenderness. There is no guarding or rebound.  Musculoskeletal:        General: No tenderness. Normal range of motion.     Right lower leg: Edema present.     Left lower leg: Edema  present.     Comments: 1+ pitting edema in her lower extremities around the ankles  Skin:    General: Skin is warm and dry.     Findings: No rash.  Neurological:     Mental Status: She is alert and oriented to person, place, and time. Mental status is at baseline.     Cranial Nerves:  No cranial nerve deficit.  Psychiatric:        Mood and Affect: Mood normal.        Behavior: Behavior normal.    ED Results / Procedures / Treatments   Labs (all labs ordered are listed, but only abnormal results are displayed) Labs Reviewed  COMPREHENSIVE METABOLIC PANEL - Abnormal; Notable for the following components:      Result Value   Potassium 3.2 (*)    CO2 33 (*)    Glucose, Bld 100 (*)    All other components within normal limits  BRAIN NATRIURETIC PEPTIDE - Abnormal; Notable for the following components:   B Natriuretic Peptide 209.1 (*)    All other components within normal limits  RESP PANEL BY RT-PCR (FLU A&B, COVID) ARPGX2  CBC WITH DIFFERENTIAL/PLATELET  TROPONIN I (HIGH SENSITIVITY)  TROPONIN I (HIGH SENSITIVITY)    EKG EKG Interpretation  Date/Time:  Saturday February 05 2021 12:16:22 EST Ventricular Rate:  81 PR Interval:    QRS Duration: 76 QT Interval:  378 QTC Calculation: 439 R Axis:   269 Text Interpretation: Atrial fibrillation Right superior axis deviation Right ventricular hypertrophy Nonspecific T wave abnormality Artifact When compared with ECG of 14-Sep-2020 14:05, PREVIOUS ECG IS PRESENT Confirmed by Blanchie Dessert [phone]) on 02/05/2021 2:07:07 PM  Radiology DG Chest 2 View  Result Date: 02/05/2021 CLINICAL DATA:  81 year old female with history of cough, shortness of breath and wheezing for the past 3 days. Evaluate for potential aspiration. EXAM: CHEST - 2 VIEW COMPARISON:  Chest x-ray 09/14/2020. FINDINGS: Lung volumes are low. Film is under penetrated, limiting the diagnostic sensitivity and specificity of the examination. With these limitations in mind,  there is no definite acute consolidative airspace disease. No pleural effusions. No pneumothorax. No evidence of pulmonary edema. Heart size is mildly enlarged. Upper mediastinal contours are within normal limits allowing for patient positioning. Multiple old healed right-sided rib fractures are incidentally noted. 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 IMPRESSION: 1. Low lung volumes without radiographic evidence of acute cardiopulmonary disease. 2. Mild cardiomegaly. Electronically Signed   By: Vinnie Langton M.D.   On: 02/05/2021 12:42    Procedures Procedures    Medications Ordered in ED Medications  albuterol (PROVENTIL) (2.5 MG/3ML) 0.083% nebulizer solution 5 mg (5 mg Nebulization Given 02/05/21 1414)  ipratropium (ATROVENT) nebulizer solution 0.5 mg (0.5 mg Nebulization Given 02/05/21 1414)  ipratropium-albuterol (DUONEB) 0.5-2.5 (3) MG/3ML nebulizer solution 3 mL (3 mLs Nebulization Given 02/05/21 1611)    ED Course/ Medical Decision Making/ A&P                           Medical Decision Making Amount and/or Complexity of Data Reviewed External Data Reviewed: notes. Labs: ordered. Decision-making details documented in ED Course. Radiology: ordered and independent interpretation performed. Decision-making details documented in ED Course. ECG/medicine tests: ordered and independent interpretation performed. Decision-making details documented in ED Course.   Early female presenting today with worsening cough and some worsening shortness of breath.  This all occurred after choking on Wednesday.  Family is concerned that she may have aspirated but she also has a history of atrial fibrillation and CHF.  Patient does have some mild edema in her lower extremities today and some rhonchi in her lungs as well as wheezing.  She is awake and mentating normally.  Repeat blood pressure is  in  the low 100s which seems to be her baseline.  Patient did have an echo done 08/03/2020 and at that time was found to have an EF of 60 to 65%.  Patient's EKG today based on my independent interpretation shows no acute findings except for atrial fibrillation which is known in this patient.  I independently evaluated the patient's chest x-ray she does appear to have mild fluid but improved from prior chest x-rays.  Radiology reported low lung volumes without acute findings and mild cardiomegaly.  Labs are pending.  COVID is negative and flu is negative.  We will give patient albuterol and Atrovent and reassess once labs have returned.  4:19 PM I independently evaluated patient's labs and today her troponin is within normal limits, CBC within normal limits, BNP is improved to 200 from 400 prior, CMP without acute findings.  COVID and flu are negative. Dr. Langston Masker to assume care and reassess pt for improvement in resp status.        Final Clinical Impression(s) / ED Diagnoses Final diagnoses:  Acute cough    Rx / DC Orders ED Discharge Orders     None         Blanchie Dessert, MD 02/05/21 1621

## 2021-02-05 NOTE — ED Provider Notes (Signed)
81 yo female w/ hx of COPD, possible aspiration event 4 days ago,  here with wet cough and SOB x 4 days.  Wears 2 L  chronically at home.  Covid/flu negative  Takes diuretics daily  Reassess after breathing treatments  630 pm -CT reviewed, no focal findings suggestive of pneumonia or significant pulmonary edema.  Patient was given 7 DuoNeb treatment and reassessed, feels that her breathing has been better since arriving in the ER.  I do not hear any audible wheezing on my reassessment.  I think is reasonable to treat this is a COPD exacerbation, started on some steroids, which she tolerates well at home she reports, and also represcribed albuterol as her current medications are likely expired.  Prescriptions were provided for her and her daughter.  They will follow-up with her PCP this week.   Wyvonnia Dusky, MD 02/05/21 [phone]

## 2021-02-05 NOTE — ED Triage Notes (Signed)
Pt reports congested cough, ShOB, weezing x 3 days. Prior to the coughing pt choked on her medication and family is concerned for aspiration.

## 2021-02-07 ENCOUNTER — Telehealth: Payer: Self-pay | Admitting: Pulmonary Disease

## 2021-02-07 ENCOUNTER — Encounter: Payer: Self-pay | Admitting: Neurology

## 2021-02-07 NOTE — Telephone Encounter (Signed)
Please schedule patient for follow up visit in the coming weeks to discuss need for a sleep study per the recommendation of her neurology team.   Thanks, Wille Glaser

## 2021-02-08 NOTE — Telephone Encounter (Signed)
Discussed with Dr Erin Fulling. He will order an in-lab sleep study for the patient.

## 2021-02-10 NOTE — Telephone Encounter (Signed)
Called patient but she did not answer and her VM is not setup. Will send a message to patient via MyChart to call the office.

## 2021-02-15 ENCOUNTER — Ambulatory Visit (HOSPITAL_BASED_OUTPATIENT_CLINIC_OR_DEPARTMENT_OTHER): Payer: Medicare HMO | Admitting: Nurse Practitioner

## 2021-02-18 ENCOUNTER — Ambulatory Visit (INDEPENDENT_AMBULATORY_CARE_PROVIDER_SITE_OTHER): Payer: Medicare HMO | Admitting: Family Medicine

## 2021-02-18 ENCOUNTER — Encounter (HOSPITAL_BASED_OUTPATIENT_CLINIC_OR_DEPARTMENT_OTHER): Payer: Self-pay | Admitting: Family Medicine

## 2021-02-18 ENCOUNTER — Other Ambulatory Visit: Payer: Self-pay

## 2021-02-18 VITALS — BP 96/72 | HR 70 | Ht 68.0 in | Wt 235.0 lb

## 2021-02-18 DIAGNOSIS — J449 Chronic obstructive pulmonary disease, unspecified: Secondary | ICD-10-CM | POA: Diagnosis not present

## 2021-02-18 DIAGNOSIS — Z23 Encounter for immunization: Secondary | ICD-10-CM | POA: Diagnosis not present

## 2021-02-18 DIAGNOSIS — I5033 Acute on chronic diastolic (congestive) heart failure: Secondary | ICD-10-CM | POA: Diagnosis not present

## 2021-02-18 DIAGNOSIS — R5381 Other malaise: Secondary | ICD-10-CM

## 2021-02-18 NOTE — Assessment & Plan Note (Signed)
Underwent cardioversion with cardiology in August 2022, however it appears that patient has not followed up with cardiologist since procedure She has had some intermittent lower extremity swelling, continues to take diuretic daily, also continues with potassium supplement as prescribed previously Given patient history, feel that patient would benefit from follow-up with cardiology to review recent symptoms, medications to ensure no changes are needed.  Patient and her daughter will contact cardiology office to schedule

## 2021-02-18 NOTE — Progress Notes (Signed)
° ° °  Procedures performed today:    None.  Independent interpretation of notes and tests performed by another provider:   None.  Brief History, Exam, Impression, and Recommendations:    BP 96/72    Pulse 70    Ht 5\' 8"  (1.727 m)    Wt 235 lb (106.6 kg)    SpO2 95%    BMI 35.73 kg/m   COPD (chronic obstructive pulmonary disease) (HCC) Patient was recently seen in the emergency department for COPD exacerbation.  At that time, patient was treated with albuterol, ipratropium in the ED.  Evaluation was generally reassuring and she was discharged home with steroids and to continue home medications.  Since visit to the ED, she has been doing fairly well.  Does report some physical deconditioning for which she is requesting referral for physical therapy at her living facility to help with improving strength and stamina.  She feels that she has some weakness in her legs preventing walking as much as well as before.  She also does endorse feeling short of breath sooner than she did in the past. She has not seen her pulmonologist recently and has also not followed up with cardiology since earlier in 2022. Recommend continue with current medications Recommend contacting pulmonologist office to schedule follow-up, particularly given that she has had some deconditioning and some exertional dyspnea that has progressed over the past few months  Acute on chronic diastolic CHF (congestive heart failure) ([place]) Underwent cardioversion with cardiology in August 2022, however it appears that patient has not followed up with cardiologist since procedure She has had some intermittent lower extremity swelling, continues to take diuretic daily, also continues with potassium supplement as prescribed previously Given patient history, feel that patient would benefit from follow-up with cardiology to review recent symptoms, medications to ensure no changes are needed.  Patient and her daughter will contact cardiology  office to schedule  Physical deconditioning Reports that related to recent issue with COPD exacerbation, patient has had some general deconditioning which is led to impaired mobility and she is requesting referral to physical therapy at her living facility.  Feel that this is reasonable, referral has been placed  Recommend follow-up with PCP here in our office.  This can be arranged once able to follow-up with pulmonology and cardiology.  Recommend that specialty visits occur within the next 4 to 6 weeks   ___________________________________________ Fritz Cauthon de [place], MD, ABFM, [facility] and [place]

## 2021-02-18 NOTE — Assessment & Plan Note (Signed)
Patient was recently seen in the emergency department for COPD exacerbation.  At that time, patient was treated with albuterol, ipratropium in the ED.  Evaluation was generally reassuring and she was discharged home with steroids and to continue home medications.  Since visit to the ED, she has been doing fairly well.  Does report some physical deconditioning for which she is requesting referral for physical therapy at her living facility to help with improving strength and stamina.  She feels that she has some weakness in her legs preventing walking as much as well as before.  She also does endorse feeling short of breath sooner than she did in the past. She has not seen her pulmonologist recently and has also not followed up with cardiology since earlier in 2022. Recommend continue with current medications Recommend contacting pulmonologist office to schedule follow-up, particularly given that she has had some deconditioning and some exertional dyspnea that has progressed over the past few months

## 2021-02-18 NOTE — Assessment & Plan Note (Signed)
Reports that related to recent issue with COPD exacerbation, patient has had some general deconditioning which is led to impaired mobility and she is requesting referral to physical therapy at her living facility.  Feel that this is reasonable, referral has been placed

## 2021-02-18 NOTE — Patient Instructions (Signed)
°  Medication Instructions:  Your physician recommends that you continue on your current medications as directed. Please refer to the Current Medication list given to you today. --If you need a refill on any your medications before your next appointment, please call your pharmacy first. If no refills are authorized on file call the office.-- Referrals/Procedures/Imaging: A referral has been placed for you to start Physical Therapy. Someone from the scheduling department will be in contact with you in regards to coordinating your consultation. If you do not hear from any of the schedulers within 7-10 business days please give their office a call.  Follow-Up: Your next appointment:   Your physician recommends that you schedule a follow-up appointment in: 2-3 MONTHS with Worthy Keeler  You will receive a text message or e-mail with a link to a survey about your care and experience with [place] today! We would greatly appreciate your feedback!   Thanks for letting us be apart of your health journey!!  Primary Care and Sports Medicine   Dr. Arlina Robes [place]   We encourage you to activate your patient portal called "MyChart".  Sign up information is provided on this After Visit Summary.  MyChart is used to connect with patients for Virtual Visits (Telemedicine).  Patients are able to view lab/test results, encounter notes, upcoming appointments, etc.  Non-urgent messages can be sent to your provider as well. To learn more about what you can do with MyChart, please visit --  [email or web address].

## 2021-02-19 ENCOUNTER — Encounter (HOSPITAL_BASED_OUTPATIENT_CLINIC_OR_DEPARTMENT_OTHER): Payer: Self-pay | Admitting: Nurse Practitioner

## 2021-02-19 DIAGNOSIS — F3341 Major depressive disorder, recurrent, in partial remission: Secondary | ICD-10-CM

## 2021-02-19 DIAGNOSIS — I5033 Acute on chronic diastolic (congestive) heart failure: Secondary | ICD-10-CM

## 2021-02-19 DIAGNOSIS — F411 Generalized anxiety disorder: Secondary | ICD-10-CM

## 2021-02-21 MED ORDER — MIRTAZAPINE 45 MG PO TABS: 45.0000 mg | ORAL_TABLET | Freq: Every day | ORAL | 1 refills | Status: DC

## 2021-02-21 MED ORDER — ATORVASTATIN CALCIUM 20 MG PO TABS: 20.0000 mg | ORAL_TABLET | Freq: Every evening | ORAL | 1 refills | Status: DC

## 2021-02-21 MED ORDER — SERTRALINE HCL 100 MG PO TABS: 100.0000 mg | ORAL_TABLET | Freq: Every day | ORAL | 1 refills | Status: DC

## 2021-02-21 MED ORDER — PANTOPRAZOLE SODIUM 20 MG PO TBEC: 20.0000 mg | DELAYED_RELEASE_TABLET | Freq: Every day | ORAL | 1 refills | Status: DC

## 2021-02-25 ENCOUNTER — Other Ambulatory Visit: Payer: Medicare HMO

## 2021-03-01 ENCOUNTER — Other Ambulatory Visit: Payer: Medicare HMO

## 2021-03-03 ENCOUNTER — Other Ambulatory Visit (HOSPITAL_BASED_OUTPATIENT_CLINIC_OR_DEPARTMENT_OTHER): Payer: Self-pay | Admitting: Nurse Practitioner

## 2021-03-03 DIAGNOSIS — F411 Generalized anxiety disorder: Secondary | ICD-10-CM

## 2021-03-08 ENCOUNTER — Ambulatory Visit: Payer: Medicare HMO

## 2021-03-09 ENCOUNTER — Other Ambulatory Visit: Payer: Self-pay

## 2021-03-09 ENCOUNTER — Encounter (HOSPITAL_BASED_OUTPATIENT_CLINIC_OR_DEPARTMENT_OTHER): Payer: Self-pay | Admitting: Nurse Practitioner

## 2021-03-09 ENCOUNTER — Ambulatory Visit (INDEPENDENT_AMBULATORY_CARE_PROVIDER_SITE_OTHER): Payer: Medicare HMO | Admitting: Nurse Practitioner

## 2021-03-09 DIAGNOSIS — J9621 Acute and chronic respiratory failure with hypoxia: Secondary | ICD-10-CM | POA: Diagnosis not present

## 2021-03-09 DIAGNOSIS — M810 Age-related osteoporosis without current pathological fracture: Secondary | ICD-10-CM | POA: Diagnosis not present

## 2021-03-09 DIAGNOSIS — I480 Paroxysmal atrial fibrillation: Secondary | ICD-10-CM

## 2021-03-09 DIAGNOSIS — R600 Localized edema: Secondary | ICD-10-CM

## 2021-03-09 DIAGNOSIS — I5033 Acute on chronic diastolic (congestive) heart failure: Secondary | ICD-10-CM | POA: Diagnosis not present

## 2021-03-09 MED ORDER — METOPROLOL SUCCINATE ER 50 MG PO TB24: 50.0000 mg | ORAL_TABLET | Freq: Two times a day (BID) | ORAL | 3 refills | Status: DC

## 2021-03-09 MED ORDER — ALENDRONATE SODIUM 70 MG PO TABS: 70.0000 mg | ORAL_TABLET | ORAL | 3 refills | Status: DC

## 2021-03-09 MED ORDER — MEDICAL COMPRESSION STOCKINGS MISC: 1 refills | Status: AC

## 2021-03-09 NOTE — Assessment & Plan Note (Signed)
Chronic. Weight stable, but edema is worse in LE. No worsening respiratory or kidney symptoms.  Patient is not moving around much, which is likely contributing. She is also unable to keep her feet elevated while seated due to spacing. Suspect that HF is not worsening but edema is dependent related to decreased mobility and gravity.  Recommend compression stockings during the day to help facilitate fluid movement from the extremities. Also recommend walking 3 times a day, even for short distances, to help improve circulation.  Recommend continue with current dosing on torsemide for at least 3 days and then return to normal dosing. F/U if no improvement or any changes.

## 2021-03-09 NOTE — Progress Notes (Signed)
Virtual Visit Encounter telephone visit.   I connected with  Lindsay [name] on 03/09/21 at 10:50 AM EST by secure audio and/or video enabled telemedicine application. I verified that I am speaking with the correct person using two identifiers.   I introduced myself as a Designer, jewellery with the practice. The limitations of evaluation and management by telemedicine discussed with the patient and the availability of in person appointments. The patient expressed verbal understanding and consent to proceed.  Participating parties in this visit include: Myself and patient  The patient is: Patient Location: Home I am: Provider Location: Office/Clinic Subjective:    CC and HPI: Lindsay [name] is a 81 y.o. year old female presenting for follow up of LE edema. Lindsay [name] tells me her legs have been more edematous for about the last 2 months. She reports that she has been sitting primarily all day and is not able to keep her feet elevated due to limited furniture in her home at [facility]. She has not had any weight gain and has been monitoring daily. She denies any erythema, warmth, or streaking on the legs. No pain. She tells me that by the end of the day the feet are very swollen, but are still somewhat edematous in the morning upon awakening. She denies worsening shortness of breath, cough, decreased urine output. She has been taking 4 torsemide caps daily for the past 57 days in an effort to get the fluid off, but this has not been helpful.   Past medical history, Surgical history, Family history not pertinant except as noted below, Social history, Allergies, and medications have been entered into the medical record, reviewed, and corrections made.   Review of Systems:  All review of systems negative except what is listed in the HPI  Objective:    Alert and oriented x 4 Speaking in clear sentences with no shortness of breath. No distress.  Impression and Recommendations:    Problem List Items  Addressed This Visit     RESOLVED: Acute on chronic respiratory failure with hypoxia (HCC)   AF (paroxysmal atrial fibrillation) (HCC)    No signs of new or worsening sx present at this time.  No signs of DVT described.  Continue current treatment.       Relevant Medications   metoprolol succinate (TOPROL-XL) 50 MG 24 hr tablet   Acute on chronic diastolic CHF (congestive heart failure) (HCC)    Chronic. Weight stable, but edema is worse in LE. No worsening respiratory or kidney symptoms.  Patient is not moving around much, which is likely contributing. She is also unable to keep her feet elevated while seated due to spacing. Suspect that HF is not worsening but edema is dependent related to decreased mobility and gravity.  Recommend compression stockings during the day to help facilitate fluid movement from the extremities. Also recommend walking 3 times a day, even for short distances, to help improve circulation.  Recommend continue with current dosing on torsemide for at least 3 days and then return to normal dosing. F/U if no improvement or any changes.        Relevant Medications   metoprolol succinate (TOPROL-XL) 50 MG 24 hr tablet   Elastic Bandages & Supports (MEDICAL COMPRESSION STOCKINGS) MISC   Bilateral lower extremity edema - Primary    Suspect dependent edema related to CHF and decreased circulation/activity.  Recommend compression stockings at 30-57mmHg to the knee daily while awake.  Increase walking to at least 5 minutes 3 times a day.  Prop feet on pillows, dining chair, or other elevated surface while seated to help with return blood flow.  No alarm sx present.  F/U if sx worsen or fail to improve.       Relevant Medications   Elastic Bandages & Supports (MEDICAL COMPRESSION STOCKINGS) MISC   Age-related osteoporosis without current pathological fracture   Relevant Medications   alendronate (FOSAMAX) 70 MG tablet (Start on 03/12/2021)    orders and follow up  as documented in EMR I discussed the assessment and treatment plan with the patient. The patient was provided an opportunity to ask questions and all were answered. The patient agreed with the plan and demonstrated an understanding of the instructions.   The patient was advised to call back or seek an in-person evaluation if the symptoms worsen or if the condition fails to improve as anticipated.  Follow-Up: prn  I provided 22 minutes of non-face-to-face interaction with this non face-to-face encounter including intake, same-day documentation, and chart review.   [name], NP , DNP, AGNP-c [place] [facility] [phone] (fax)

## 2021-03-09 NOTE — Patient Instructions (Addendum)
I have sent a prescription to [facility] for compression stockings to see if this will help with the swelling in your feet and legs. This should help to push the blood back into your blood vessels and allow the kidneys to filter this out of the body.   Compression stockings are worn during the day while you are awake. Be sure to take them off at night to let your legs relax.  Try to keep your feet propped up as much as possible when you are sitting. You can use pillows or a chair to do this, if you do not have a foot stool. Walking is helpful, too. Even short walks are good.   Keep monitoring your weight, I do recommend you continue with the double dose of torsemide for at least 3 days after starting the compression stockings to help make sure that the fluid is pulled out of the body. If you do gain weight, please let us know.   If [facility] cannot deliver the stockings, these can be purchased at any medical supply store or online from [email or web address] I recommend you get stockings that are 30-14mmHg pressure. You may need to measure your ankle and calf to make sure that the fit is right if you order them. I recommend only getting the ones that go to the knee.

## 2021-03-09 NOTE — Assessment & Plan Note (Signed)
No signs of new or worsening sx present at this time.  No signs of DVT described.  Continue current treatment.

## 2021-03-09 NOTE — Assessment & Plan Note (Signed)
>>  ASSESSMENT AND PLAN FOR AF (PAROXYSMAL ATRIAL FIBRILLATION) (HCC) WRITTEN ON 03/09/2021 11:44 AM BY Marijke Guadiana E, NP  No signs of new or worsening sx present at this time.  No signs of DVT described.  Continue current treatment.

## 2021-03-09 NOTE — Assessment & Plan Note (Signed)
Suspect dependent edema related to CHF and decreased circulation/activity.  Recommend compression stockings at 30-61mmHg to the knee daily while awake.  Increase walking to at least 5 minutes 3 times a day.  Prop feet on pillows, dining chair, or other elevated surface while seated to help with return blood flow.  No alarm sx present.  F/U if sx worsen or fail to improve.

## 2021-03-22 ENCOUNTER — Encounter: Payer: Self-pay | Admitting: Neurology

## 2021-03-31 ENCOUNTER — Other Ambulatory Visit: Payer: Medicare HMO

## 2021-04-03 ENCOUNTER — Other Ambulatory Visit: Payer: Medicare HMO

## 2021-04-05 ENCOUNTER — Other Ambulatory Visit: Payer: Medicare HMO

## 2021-04-12 ENCOUNTER — Telehealth: Payer: Self-pay | Admitting: Neurology

## 2021-04-12 ENCOUNTER — Other Ambulatory Visit: Payer: Self-pay | Admitting: Neurology

## 2021-04-12 DIAGNOSIS — R200 Anesthesia of skin: Secondary | ICD-10-CM

## 2021-04-12 NOTE — Telephone Encounter (Signed)
Checked chart. I see she has already been r/s for May 03, 2021 at 11:30 AM.  ?

## 2021-04-12 NOTE — Telephone Encounter (Signed)
Can you please put a new order in for the MRI so the patient can reschedule her MRI. She said the [facility] has not come by to pick her up both times she had her MRI scheduled. I gave her GI phone number to r/s with them.  ?

## 2021-04-25 ENCOUNTER — Other Ambulatory Visit: Payer: Self-pay | Admitting: Internal Medicine

## 2021-04-25 DIAGNOSIS — Z1231 Encounter for screening mammogram for malignant neoplasm of breast: Secondary | ICD-10-CM

## 2021-04-27 ENCOUNTER — Other Ambulatory Visit: Payer: Medicare HMO

## 2021-04-28 ENCOUNTER — Ambulatory Visit: Payer: Medicare HMO | Admitting: Adult Health

## 2021-05-03 ENCOUNTER — Other Ambulatory Visit: Payer: Medicare HMO

## 2021-05-09 ENCOUNTER — Ambulatory Visit (INDEPENDENT_AMBULATORY_CARE_PROVIDER_SITE_OTHER): Payer: Medicare HMO | Admitting: Pulmonary Disease

## 2021-05-09 ENCOUNTER — Other Ambulatory Visit: Payer: Self-pay

## 2021-05-09 ENCOUNTER — Emergency Department (HOSPITAL_COMMUNITY): Payer: Medicare HMO

## 2021-05-09 ENCOUNTER — Encounter: Payer: Self-pay | Admitting: Pulmonary Disease

## 2021-05-09 ENCOUNTER — Encounter (HOSPITAL_COMMUNITY): Payer: Self-pay | Admitting: Emergency Medicine

## 2021-05-09 ENCOUNTER — Inpatient Hospital Stay (HOSPITAL_COMMUNITY)
Admission: EM | Admit: 2021-05-09 | Discharge: 2021-05-13 | DRG: 189 | Disposition: A | Discharge status: Home Health | Payer: Medicare HMO | Attending: Internal Medicine | Admitting: Internal Medicine

## 2021-05-09 VITALS — BP 122/68 | HR 86 | Ht 68.0 in | Wt 235.0 lb

## 2021-05-09 DIAGNOSIS — J4521 Mild intermittent asthma with (acute) exacerbation: Secondary | ICD-10-CM | POA: Diagnosis not present

## 2021-05-09 DIAGNOSIS — R0600 Dyspnea, unspecified: Secondary | ICD-10-CM | POA: Diagnosis present

## 2021-05-09 DIAGNOSIS — I5032 Chronic diastolic (congestive) heart failure: Secondary | ICD-10-CM

## 2021-05-09 DIAGNOSIS — B9781 Human metapneumovirus as the cause of diseases classified elsewhere: Secondary | ICD-10-CM | POA: Diagnosis present

## 2021-05-09 DIAGNOSIS — Z8249 Family history of ischemic heart disease and other diseases of the circulatory system: Secondary | ICD-10-CM | POA: Diagnosis not present

## 2021-05-09 DIAGNOSIS — R739 Hyperglycemia, unspecified: Secondary | ICD-10-CM | POA: Diagnosis present

## 2021-05-09 DIAGNOSIS — E876 Hypokalemia: Secondary | ICD-10-CM

## 2021-05-09 DIAGNOSIS — S41112A Laceration without foreign body of left upper arm, initial encounter: Secondary | ICD-10-CM | POA: Diagnosis present

## 2021-05-09 DIAGNOSIS — R0602 Shortness of breath: Secondary | ICD-10-CM

## 2021-05-09 DIAGNOSIS — I5033 Acute on chronic diastolic (congestive) heart failure: Secondary | ICD-10-CM | POA: Diagnosis present

## 2021-05-09 DIAGNOSIS — I509 Heart failure, unspecified: Secondary | ICD-10-CM | POA: Diagnosis present

## 2021-05-09 DIAGNOSIS — E669 Obesity, unspecified: Secondary | ICD-10-CM | POA: Diagnosis present

## 2021-05-09 DIAGNOSIS — Z886 Allergy status to analgesic agent status: Secondary | ICD-10-CM

## 2021-05-09 DIAGNOSIS — R7301 Impaired fasting glucose: Secondary | ICD-10-CM

## 2021-05-09 DIAGNOSIS — J9621 Acute and chronic respiratory failure with hypoxia: Secondary | ICD-10-CM

## 2021-05-09 DIAGNOSIS — I480 Paroxysmal atrial fibrillation: Secondary | ICD-10-CM | POA: Diagnosis present

## 2021-05-09 DIAGNOSIS — J302 Other seasonal allergic rhinitis: Secondary | ICD-10-CM

## 2021-05-09 DIAGNOSIS — K219 Gastro-esophageal reflux disease without esophagitis: Secondary | ICD-10-CM | POA: Diagnosis present

## 2021-05-09 DIAGNOSIS — F419 Anxiety disorder, unspecified: Secondary | ICD-10-CM

## 2021-05-09 DIAGNOSIS — E7800 Pure hypercholesterolemia, unspecified: Secondary | ICD-10-CM | POA: Diagnosis present

## 2021-05-09 DIAGNOSIS — J45901 Unspecified asthma with (acute) exacerbation: Secondary | ICD-10-CM | POA: Diagnosis present

## 2021-05-09 DIAGNOSIS — I4819 Other persistent atrial fibrillation: Secondary | ICD-10-CM | POA: Diagnosis present

## 2021-05-09 DIAGNOSIS — Z20822 Contact with and (suspected) exposure to covid-19: Secondary | ICD-10-CM | POA: Diagnosis present

## 2021-05-09 DIAGNOSIS — Z803 Family history of malignant neoplasm of breast: Secondary | ICD-10-CM | POA: Diagnosis not present

## 2021-05-09 DIAGNOSIS — J440 Chronic obstructive pulmonary disease with acute lower respiratory infection: Secondary | ICD-10-CM | POA: Diagnosis present

## 2021-05-09 DIAGNOSIS — Z9981 Dependence on supplemental oxygen: Secondary | ICD-10-CM

## 2021-05-09 DIAGNOSIS — Z87891 Personal history of nicotine dependence: Secondary | ICD-10-CM | POA: Diagnosis not present

## 2021-05-09 DIAGNOSIS — Z7901 Long term (current) use of anticoagulants: Secondary | ICD-10-CM

## 2021-05-09 DIAGNOSIS — I110 Hypertensive heart disease with heart failure: Secondary | ICD-10-CM | POA: Diagnosis present

## 2021-05-09 DIAGNOSIS — Z79899 Other long term (current) drug therapy: Secondary | ICD-10-CM

## 2021-05-09 DIAGNOSIS — J9811 Atelectasis: Secondary | ICD-10-CM | POA: Diagnosis present

## 2021-05-09 DIAGNOSIS — W19XXXA Unspecified fall, initial encounter: Secondary | ICD-10-CM | POA: Diagnosis present

## 2021-05-09 DIAGNOSIS — Z885 Allergy status to narcotic agent status: Secondary | ICD-10-CM

## 2021-05-09 DIAGNOSIS — E1169 Type 2 diabetes mellitus with other specified complication: Secondary | ICD-10-CM

## 2021-05-09 DIAGNOSIS — E785 Hyperlipidemia, unspecified: Secondary | ICD-10-CM

## 2021-05-09 DIAGNOSIS — R54 Age-related physical debility: Secondary | ICD-10-CM | POA: Diagnosis present

## 2021-05-09 DIAGNOSIS — J9611 Chronic respiratory failure with hypoxia: Secondary | ICD-10-CM

## 2021-05-09 DIAGNOSIS — J441 Chronic obstructive pulmonary disease with (acute) exacerbation: Secondary | ICD-10-CM

## 2021-05-09 DIAGNOSIS — R0609 Other forms of dyspnea: Secondary | ICD-10-CM | POA: Diagnosis present

## 2021-05-09 DIAGNOSIS — Z7983 Long term (current) use of bisphosphonates: Secondary | ICD-10-CM

## 2021-05-09 DIAGNOSIS — I10 Essential (primary) hypertension: Secondary | ICD-10-CM

## 2021-05-09 DIAGNOSIS — F411 Generalized anxiety disorder: Secondary | ICD-10-CM

## 2021-05-09 DIAGNOSIS — Z6835 Body mass index (BMI) 35.0-35.9, adult: Secondary | ICD-10-CM

## 2021-05-09 DIAGNOSIS — E1159 Type 2 diabetes mellitus with other circulatory complications: Secondary | ICD-10-CM

## 2021-05-09 DIAGNOSIS — J9601 Acute respiratory failure with hypoxia: Principal | ICD-10-CM

## 2021-05-09 HISTORY — DX: Dyspnea, unspecified: R06.00

## 2021-05-09 LAB — CBC WITH DIFFERENTIAL/PLATELET
Abs Immature Granulocytes: 0.03 10*3/uL (ref 0.00–0.07)
Basophils Absolute: 0 10*3/uL (ref 0.0–0.1)
Basophils Relative: 0 %
Eosinophils Absolute: 0.1 10*3/uL (ref 0.0–0.5)
Eosinophils Relative: 1 %
HCT: 40.4 % (ref 36.0–46.0)
Hemoglobin: 13.1 g/dL (ref 12.0–15.0)
Immature Granulocytes: 0 %
Lymphocytes Relative: 10 %
Lymphs Abs: 0.7 10*3/uL (ref 0.7–4.0)
MCH: 31 pg (ref 26.0–34.0)
MCHC: 32.4 g/dL (ref 30.0–36.0)
MCV: 95.5 fL (ref 80.0–100.0)
Monocytes Absolute: 0.5 10*3/uL (ref 0.1–1.0)
Monocytes Relative: 7 %
Neutro Abs: 5.4 10*3/uL (ref 1.7–7.7)
Neutrophils Relative %: 82 %
Platelets: 224 10*3/uL (ref 150–400)
RBC: 4.23 MIL/uL (ref 3.87–5.11)
RDW: 14 % (ref 11.5–15.5)
WBC: 6.8 10*3/uL (ref 4.0–10.5)
nRBC: 0 % (ref 0.0–0.2)

## 2021-05-09 LAB — TROPONIN I (HIGH SENSITIVITY)
Troponin I (High Sensitivity): 9 ng/L (ref <18)
Troponin I (High Sensitivity): 9 ng/L (ref <18)

## 2021-05-09 LAB — RESPIRATORY PANEL BY PCR

## 2021-05-09 LAB — HEMOGLOBIN A1C
Hgb A1c MFr Bld: 5.5 % (ref 4.8–5.6)
Mean Plasma Glucose: 111.15 mg/dL

## 2021-05-09 LAB — BASIC METABOLIC PANEL
Anion gap: 7 (ref 5–15)
BUN: 27 mg/dL — ABNORMAL HIGH (ref 8–23)
CO2: 34 mmol/L — ABNORMAL HIGH (ref 22–32)
Calcium: 8.7 mg/dL — ABNORMAL LOW (ref 8.9–10.3)
Chloride: 98 mmol/L (ref 98–111)
Creatinine, Ser: 0.78 mg/dL (ref 0.44–1.00)
GFR, Estimated: >60 mL/min (ref >60)
Glucose, Bld: 160 mg/dL — ABNORMAL HIGH (ref 70–99)
Potassium: 3.2 mmol/L — ABNORMAL LOW (ref 3.5–5.1)
Sodium: 139 mmol/L (ref 135–145)

## 2021-05-09 LAB — MAGNESIUM: Magnesium: 2.1 mg/dL (ref 1.7–2.4)

## 2021-05-09 LAB — RESP PANEL BY RT-PCR (FLU A&B, COVID) ARPGX2
Influenza A by PCR: NEGATIVE
Influenza B by PCR: NEGATIVE
SARS Coronavirus 2 by RT PCR: NEGATIVE

## 2021-05-09 LAB — LACTIC ACID, PLASMA: Lactic Acid, Venous: 1.8 mmol/L (ref 0.5–1.9)

## 2021-05-09 LAB — BRAIN NATRIURETIC PEPTIDE: B Natriuretic Peptide: 166.7 pg/mL — ABNORMAL HIGH (ref 0.0–100.0)

## 2021-05-09 MED ORDER — ACETAMINOPHEN 325 MG PO TABS: 650.0000 mg | ORAL_TABLET | Freq: Four times a day (QID) | ORAL | Status: DC | PRN

## 2021-05-09 MED ORDER — METHYLPREDNISOLONE SODIUM SUCC 40 MG IJ SOLR
40.0000 mg | Freq: Two times a day (BID) | INTRAMUSCULAR | Status: AC
  Administered 2021-05-09 – 2021-05-10 (×2): 40 mg via INTRAVENOUS
  Filled 2021-05-09 (×2): qty 1

## 2021-05-09 MED ORDER — IPRATROPIUM-ALBUTEROL 0.5-2.5 (3) MG/3ML IN SOLN: 3.0000 mL | Freq: Three times a day (TID) | RESPIRATORY_TRACT | Status: DC

## 2021-05-09 MED ORDER — IPRATROPIUM-ALBUTEROL 0.5-2.5 (3) MG/3ML IN SOLN
3.0000 mL | Freq: Once | RESPIRATORY_TRACT | Status: AC
  Administered 2021-05-09: 3 mL via RESPIRATORY_TRACT
  Filled 2021-05-09: qty 3

## 2021-05-09 MED ORDER — FUROSEMIDE 10 MG/ML IJ SOLN
40.0000 mg | Freq: Once | INTRAMUSCULAR | Status: AC
  Administered 2021-05-09: 40 mg via INTRAVENOUS
  Filled 2021-05-09: qty 4

## 2021-05-09 MED ORDER — PANTOPRAZOLE SODIUM 40 MG PO TBEC
40.0000 mg | DELAYED_RELEASE_TABLET | Freq: Every day | ORAL | Status: DC
  Administered 2021-05-09: 40 mg via ORAL
  Filled 2021-05-09: qty 1

## 2021-05-09 MED ORDER — CETIRIZINE HCL 10 MG PO TABS: 10.0000 mg | ORAL_TABLET | Freq: Every day | ORAL | 11 refills | Status: DC

## 2021-05-09 MED ORDER — IPRATROPIUM-ALBUTEROL 0.5-2.5 (3) MG/3ML IN SOLN: 3.0000 mL | Freq: Four times a day (QID) | RESPIRATORY_TRACT | 3 refills | Status: DC | PRN

## 2021-05-09 MED ORDER — DOXYCYCLINE HYCLATE 100 MG PO TABS
100.0000 mg | ORAL_TABLET | Freq: Once | ORAL | Status: AC
  Administered 2021-05-09: 100 mg via ORAL
  Filled 2021-05-09: qty 1

## 2021-05-09 MED ORDER — GUAIFENESIN ER 600 MG PO TB12
600.0000 mg | ORAL_TABLET | Freq: Two times a day (BID) | ORAL | Status: DC
  Administered 2021-05-09 – 2021-05-13 (×8): 600 mg via ORAL
  Filled 2021-05-09 (×8): qty 1

## 2021-05-09 MED ORDER — BENZONATATE 100 MG PO CAPS
200.0000 mg | ORAL_CAPSULE | Freq: Three times a day (TID) | ORAL | Status: DC | PRN
Start: 2021-05-09 — End: 2021-05-13
  Administered 2021-05-09 – 2021-05-13 (×6): 200 mg via ORAL
  Filled 2021-05-09 (×6): qty 2

## 2021-05-09 MED ORDER — ACETAMINOPHEN 650 MG RE SUPP: 650.0000 mg | Freq: Four times a day (QID) | RECTAL | Status: DC | PRN

## 2021-05-09 MED ORDER — POTASSIUM CHLORIDE CRYS ER 20 MEQ PO TBCR
40.0000 meq | EXTENDED_RELEASE_TABLET | Freq: Once | ORAL | Status: AC
Start: 2021-05-09 — End: 2021-05-09
  Administered 2021-05-09: 40 meq via ORAL
  Filled 2021-05-09: qty 2

## 2021-05-09 MED ORDER — IPRATROPIUM-ALBUTEROL 0.5-2.5 (3) MG/3ML IN SOLN
3.0000 mL | Freq: Four times a day (QID) | RESPIRATORY_TRACT | Status: DC
  Administered 2021-05-09: 3 mL via RESPIRATORY_TRACT
  Filled 2021-05-09: qty 3

## 2021-05-09 MED ORDER — METHYLPREDNISOLONE SODIUM SUCC 125 MG IJ SOLR
125.0000 mg | Freq: Once | INTRAMUSCULAR | Status: AC
  Administered 2021-05-09: 125 mg via INTRAVENOUS
  Filled 2021-05-09: qty 2

## 2021-05-09 MED ORDER — PREDNISONE 20 MG PO TABS
40.0000 mg | ORAL_TABLET | Freq: Every day | ORAL | Status: DC
  Administered 2021-05-11 – 2021-05-13 (×3): 40 mg via ORAL
  Filled 2021-05-09 (×3): qty 2

## 2021-05-09 MED ORDER — MAGNESIUM SULFATE 2 GM/50ML IV SOLN
2.0000 g | Freq: Once | INTRAVENOUS | Status: AC
  Administered 2021-05-09: 2 g via INTRAVENOUS
  Filled 2021-05-09: qty 50

## 2021-05-09 MED ORDER — FUROSEMIDE 10 MG/ML IJ SOLN
40.0000 mg | Freq: Two times a day (BID) | INTRAMUSCULAR | Status: DC
  Administered 2021-05-09 – 2021-05-13 (×8): 40 mg via INTRAVENOUS
  Filled 2021-05-09 (×8): qty 4

## 2021-05-09 MED ORDER — ALBUTEROL SULFATE (2.5 MG/3ML) 0.083% IN NEBU: 2.5000 mg | INHALATION_SOLUTION | Freq: Four times a day (QID) | RESPIRATORY_TRACT | Status: DC | PRN

## 2021-05-09 MED ORDER — POTASSIUM CHLORIDE CRYS ER 20 MEQ PO TBCR
40.0000 meq | EXTENDED_RELEASE_TABLET | Freq: Every day | ORAL | Status: DC
  Administered 2021-05-10 – 2021-05-13 (×4): 40 meq via ORAL
  Filled 2021-05-09 (×4): qty 2

## 2021-05-09 NOTE — Progress Notes (Signed)
? ?Synopsis: Referred in January 2022 for chronic respiratory failure ? ?Subjective:  ? ?PATIENT ID: Lindsay [name] GENDER: female DOB: March 30, 1940, MRN: [id number] ? ?HPI ? ?Chief Complaint  ?Patient presents with  ? Follow-up  ?  85yrf/u for resp.failure. On 2L of O2 24/7. Increased wheezing over the past 5 days. Unable to walk more than 192fbefore O2 drops to the 70s.   ? ?Lindsay Hirschfelds a [age]ear old woman, never smoker with chronic respiratory failure on 2L of supplemental oxygen who returns to pulmonary clinic for follow up. ? 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 There was also concern for asthma in which she was started on trelegy ellipta.  ? ?She reports increasing dyspnea and wheezing over the past 3 days. She has noted O2 saturations in the 70s when walking at home on 2L O2. She has not been able to walk as far recently and required a wheelchair to enter clinic today. Her SpO2 was confirmed in clinic to drop in the 70s with ambulation this morning. She has been using duoneb treatments every 6 hours and taking extra lasix doses without improvement in her breathing. She also complains of painful urination and inreased urinary frequency over the past 2 days concerning for UTI. She reports falling at home and scraping her left elbow. She has been feeling lightheaded.  ? ?OV 02/17/2020 ?She reports she started having issues with her breathing 2 years ago after she suffered compound fractures in her back that led to an increase in the curvature of her thoracic spine. She was started on supplemental oxygen shortly after these events occurred. Per her chart she has exertional oxygen desaturations to 87% from her physical therapy notes. She also reports cough and wheezing intermittently. She denies sputum production. She is a never smoker but lived with a smoker for 20 years. She denies history of pneumonias.  ? ?She has been started on trelegy ellipta and she felt this  helped her for a period of time but doesn't notice lasting relief from it anymore.  ? ?She has atrial fibrillation. She does have leg swelling and is taking torsemide.  ? ?She has received 2 covid vaccines. She received her pneumonia vaccine 2 weeks ago and she had the influenza vaccine this past fall.    ? ?[place] and lived on [place] own. Her daughter lives here in town.  ? ?Past Medical History:  ?Diagnosis Date  ? Acute diastolic CHF (congestive heart failure) ([facility]  ? Acute non-recurrent pansinusitis 10/21/2020  ? Acute on chronic respiratory failure with hypoxia ([place]/11[place]2020  ? Asthma   ? [place]rial fibrillation ([place]  ? Dysphagia 08/02/2020  ? Elevated troponin 09/14/2020  ? High cholesterol   ? HTN (hypertension)   ? Hyperkalemia 09/14/2020  ? Hyponatremia 08/02/2020  ? Low grade squamous intraepithelial lesion on cytologic smear of cervix ([facility]) 12/02/2019  ? Respiratory failure with hypoxia ([facility]  ?  ? ?Family History  ?Problem Relation Age of Onset  ? Breast cancer Sister   ? Hypertension Other   ?  ? ?Social History  ? ?Socioeconomic History  ? Marital status: Divorced  ?  Spouse name: Not on file  ? Number of children: Not on file  ? Years of education: Not on file  ? Highest education level: Not on file  ?Occupational History  ? Not on file  ?Tobacco  Use  ? Smoking status: Former  ?  Types: Cigarettes  ? Smokeless tobacco: Never  ? Tobacco comments:  ?  Quit remotely 1970s  ?Vaping Use  ? Vaping Use: Never used  ?Substance and Sexual Activity  ? Alcohol use: Not Currently  ? Drug use: Never  ? Sexual activity: Not Currently  ?Other Topics Concern  ? Not on file  ?Social History Narrative  ? Not on file  ? ?Social Determinants of Health  ? ?Financial Resource Strain: Not on file  ?Food Insecurity: Not on file  ?Transportation Needs: Not on file  ?Physical Activity: Not on file  ?Stress: Not on file  ?Social Connections: Not  on file  ?Intimate Partner Violence: Not on file  ?  ? ?Allergies  ?Allergen Reactions  ? Acetaminophen-Codeine Other (See Comments)  ?  Agitation  ? Codeine Other (See Comments)  ?  hallucinations  ?  ? ?Outpatient Medications Prior to Visit  ?Medication Sig Dispense Refill  ? albuterol (VENTOLIN HFA) 108 (90 Base) MCG/ACT inhaler Inhale 1-2 puffs into the lungs every 6 (six) hours as needed for wheezing or shortness of breath. 8 g 6  ? alendronate (FOSAMAX) 70 MG tablet Take 1 tablet (70 mg total) by mouth every Saturday. 12 tablet 3  ? apixaban (ELIQUIS) 5 MG TABS tablet Take 1 tablet (5 mg total) by mouth 2 (two) times daily. 60 tablet 11  ? atorvastatin (LIPITOR) 20 MG tablet Take 1 tablet (20 mg total) by mouth every evening. 90 tablet 1  ? benzonatate (TESSALON) 200 MG capsule Take 1 capsule (200 mg total) by mouth 3 (three) times daily as needed for cough. 30 capsule 0  ? Calcium-Magnesium-Vitamin D (CALCIUM 1200+D3 PO) Take 1 tablet by mouth in the morning.    ? dextromethorphan-guaiFENesin (MUCINEX DM) 30-600 MG 12hr tablet Take 2 tablets by mouth 2 (two) times daily. 360 tablet 3  ? Elastic Bandages & Supports (MEDICAL COMPRESSION STOCKINGS) MISC Two pair knee high 30-42mHg compression stockings. Sized XL. As covered by insurance for ICD-10: I50.33, R60.0. 2 each 1  ? LORazepam (ATIVAN) 1 MG tablet TAKE 1 TABLET BY MOUTH 2 TIMES DAILY AS NEEDED 60 tablet 2  ? Menthol (COUGH DROPS) 5.8 MG LOZG Use as directed 1 lozenge (5.8 mg total) in the mouth or throat as needed (for dry, sore, or scratchy throat). 36 lozenge 11  ? metoprolol succinate (TOPROL-XL) 50 MG 24 hr tablet Take 1 tablet (50 mg total) by mouth 2 (two) times daily. 180 tablet 3  ? mirtazapine (REMERON) 45 MG tablet Take 1 tablet (45 mg total) by mouth at bedtime. 90 tablet 1  ? Multiple Vitamin (MULTIVITAMIN WITH MINERALS) TABS tablet Take 1 tablet by mouth in the morning.    ? OXYGEN Inhale 2 L into the lungs daily as needed (shortness of  breath or activity level).    ? pantoprazole (PROTONIX) 20 MG tablet Take 1 tablet (20 mg total) by mouth daily. 90 tablet 1  ? sertraline (ZOLOFT) 100 MG tablet Take 1 tablet (100 mg total) by mouth at bedtime. 90 tablet 1  ? torsemide (DEMADEX) 20 MG tablet TAKE 2 TABLETS DAILY EVERY MORNING. TAKE AN ADDITIONAL 2 TABLETS AS NEEDED FOR WEIGHT GAIN OF 2 POUNDS OVERNIGHT OR 5 POUNDS IN ONE WEEK. 225 tablet 5  ? albuterol (PROVENTIL) (5 MG/ML) 0.5% nebulizer solution Take 0.5 mLs (2.5 mg total) by nebulization every 6 (six) hours as needed for wheezing or shortness of breath. 20 mL 12  ?  ipratropium-albuterol (DUONEB) 0.5-2.5 (3) MG/3ML SOLN Inhale 3 mLs into the lungs every 6 (six) hours as needed (wheezing). 360 mL 3  ? potassium chloride (KLOR-CON) 10 MEQ tablet Take 2 tablets (20 mEq total) by mouth daily. 180 tablet 1  ? ?No facility-administered medications prior to visit.  ? ?Review of Systems  ?Constitutional:  Negative for chills, fever, malaise/fatigue and weight loss.  ?HENT:  Negative for congestion, sinus pain and sore throat.   ?Eyes: Negative.   ?Respiratory:  Positive for shortness of breath and wheezing. Negative for cough, hemoptysis and sputum production.   ?Cardiovascular:  Negative for chest pain, palpitations, orthopnea, claudication and leg swelling.  ?Gastrointestinal:  Negative for abdominal pain, heartburn, nausea and vomiting.  ?Genitourinary: Negative.   ?Musculoskeletal:  Positive for back pain and falls. Negative for joint pain and myalgias.  ?Skin:  Negative for rash.  ?Neurological:  Positive for dizziness. Negative for weakness.  ?Endo/Heme/Allergies: Negative.   ?Psychiatric/Behavioral: Negative.    ? ?Objective:  ? ?Vitals:  ? 05/09/21 0910  ?BP: 122/68  ?Pulse: 86  ?SpO2: 90%  ?Weight: 235 lb (106.6 kg)  ?Height: '5\' 8"'$  (1.727 m)  ? ?Physical Exam ?Constitutional:   ?   General: She is not in acute distress. ?   Appearance: She is ill-appearing.  ?HENT:  ?   Head: Normocephalic and  atraumatic.  ?Eyes:  ?   General: No scleral icterus. ?   Conjunctiva/sclera: Conjunctivae normal.  ?   Pupils: Pupils are equal, round, and reactive to light.  ?Cardiovascular:  ?   Rate and Rhythm: Normal

## 2021-05-09 NOTE — ED Notes (Signed)
ED Provider at bedside. 

## 2021-05-09 NOTE — Patient Instructions (Addendum)
Given your drop in oxygen saturations and wheezing on exam, I am concern you have an asthma exacerbation along with possible aggravation of your heart failure.  ? ?Your history is also concerning for UTI.  ? ?I recommend you go to the ER for further evaluation with labs, chest x-ray, urinalysis with potential treatments with antibiotics and steroids.  ? ?Recommend starting zyrtec '10mg'$  daily for allergies.  ? ?Follow up in 3 months.  ? ? ? ?

## 2021-05-09 NOTE — H&P (Signed)
?History and Physical  ? ? ?Patient: Lindsay [name] KPV:[id number] DOB: 08-22-1940 ?DOA: 05/09/2021 ?DOS: the patient was seen and examined on 05/09/2021 ?PCP: [name], NP  ?Patient coming from: Home ? ?Chief Complaint:  ?Chief Complaint  ?Patient presents with  ? Shortness of Breath  ? ?HPI: Lindsay [name] is a 81 y.o. female with medical history significant of asthma, chronic diastolic HF, HLD, afib on eliquis, chronic hypoxic respiratory failure on 2L, HTN, anxiety. Presenting with dyspnea.  Symptoms started 5 days ago with cough and runny nose. She reports several people in her senior living community have had similar symptoms. She didn't have any fever, N/V/D. She tried her regular meds and OTC cough meds to help, but they didn't. Her symptoms progressed to the point where she visited her pulmonologist this morning. After examination, it was recommended that she come to the ED for assistance. She denies any other aggravating or alleviating factors.  ?  ?Review of Systems: As mentioned in the history of present illness. All other systems reviewed and are negative. ?Past Medical History:  ?Diagnosis Date  ? Acute diastolic CHF (congestive heart failure) ([place]) 08/03/2020  ? Acute non-recurrent pansinusitis 10/21/2020  ? Acute on chronic respiratory failure with hypoxia ([place]) 08/02/2020  ? Asthma   ? Atrial fibrillation ([place])   ? Dysphagia 08/02/2020  ? Elevated troponin 09/14/2020  ? High cholesterol   ? HTN (hypertension)   ? Hyperkalemia 09/14/2020  ? Hyponatremia 08/02/2020  ? Low grade squamous intraepithelial lesion on cytologic smear of cervix (LGSIL) 12/02/2019  ? Respiratory failure with hypoxia ([place])   ? ?Past Surgical History:  ?Procedure Laterality Date  ? CARDIOVERSION N/A 09/13/2020  ? Procedure: CARDIOVERSION;  Surgeon: [name], MD;  Location: [facility] ENDOSCOPY;  Service: Cardiovascular;  Laterality: N/A;  ? ESOPHAGOGASTRODUODENOSCOPY N/A 08/05/2020  ? Procedure: ESOPHAGOGASTRODUODENOSCOPY (EGD);  Surgeon:  Otis Brace, MD;  Location: [facility] ENDOSCOPY;  Service: Gastroenterology;  Laterality: N/A;  ? ?Social History:  reports that she has quit smoking. Her smoking use included cigarettes. She has never used smokeless tobacco. She reports that she does not currently use alcohol. She reports that she does not use drugs. ? ?Allergies  ?Allergen Reactions  ? Acetaminophen-Codeine Other (See Comments)  ?  Agitation  ? Codeine Other (See Comments)  ?  hallucinations  ? ? ?Family History  ?Problem Relation Age of Onset  ? Breast cancer Sister   ? Hypertension Other   ? ? ?Prior to Admission medications   ?Medication Sig Start Date End Date Taking? Authorizing Provider  ?albuterol (VENTOLIN HFA) 108 (90 Base) MCG/ACT inhaler Inhale 1-2 puffs into the lungs every 6 (six) hours as needed for wheezing or shortness of breath. 10/21/20   Early, Coralee Pesa, NP  ?alendronate (FOSAMAX) 70 MG tablet Take 1 tablet (70 mg total) by mouth every Saturday. 03/12/21   [name], NP  ?apixaban (ELIQUIS) 5 MG TABS tablet Take 1 tablet (5 mg total) by mouth 2 (two) times daily. 10/21/20   [name], NP  ?atorvastatin (LIPITOR) 20 MG tablet Take 1 tablet (20 mg total) by mouth every evening. 02/21/21   [name], NP  ?benzonatate (TESSALON) 200 MG capsule Take 1 capsule (200 mg total) by mouth 3 (three) times daily as needed for cough. 02/04/21   [name], NP  ?Calcium-Magnesium-Vitamin D (CALCIUM 1200+D3 PO) Take 1 tablet by mouth in the morning.    [provider]  ?cetirizine (ZYRTEC ALLERGY) 10 MG tablet Take  1 tablet (10 mg total) by mouth daily. 05/09/21   Freddi Starr, MD  ?dextromethorphan-guaiFENesin [facility] DM) 30-600 MG 12hr tablet Take 2 tablets by mouth 2 (two) times daily. 10/21/20   [name], NP  ?Elastic Bandages & Supports (MEDICAL COMPRESSION STOCKINGS) MISC Two pair knee high 30-26mHg compression stockings. Sized XL. As covered by insurance for ICD-10: I50.33, R60.0. 03/09/21   Early, SCoralee Pesa NP   ?ipratropium-albuterol (DUONEB) 0.5-2.5 (3) MG/3ML SOLN Inhale 3 mLs into the lungs every 6 (six) hours as needed (wheezing). 05/09/21   DFreddi Starr MD  ?LORazepam (ATIVAN) 1 MG tablet TAKE 1 TABLET BY MOUTH 2 TIMES DAILY AS NEEDED ?Patient taking differently: Take 1 mg by mouth 2 (two) times daily as needed for anxiety. 03/07/21   EOrma Render NP  ?Menthol (COUGH DROPS) 5.8 MG LOZG Use as directed 1 lozenge (5.8 mg total) in the mouth or throat as needed (for dry, sore, or scratchy throat). 11/16/20   EOrma Render NP  ?metoprolol succinate (TOPROL-XL) 50 MG 24 hr tablet Take 1 tablet (50 mg total) by mouth 2 (two) times daily. 03/09/21   EOrma Render NP  ?mirtazapine (REMERON) 45 MG tablet Take 1 tablet (45 mg total) by mouth at bedtime. 02/21/21   EOrma Render NP  ?Multiple Vitamin (MULTIVITAMIN WITH MINERALS) TABS tablet Take 1 tablet by mouth in the morning.    [provider]  ?OXYGEN Inhale 2 L into the lungs daily as needed (shortness of breath or activity level).    [provider]  ?pantoprazole (PROTONIX) 20 MG tablet Take 1 tablet (20 mg total) by mouth daily. 02/21/21   EOrma Render NP  ?potassium chloride (KLOR-CON) 10 MEQ tablet Take 2 tablets (20 mEq total) by mouth daily. 08/24/20 02/20/21  WLoel Dubonnet NP  ?sertraline (ZOLOFT) 100 MG tablet Take 1 tablet (100 mg total) by mouth at bedtime. 02/21/21   EOrma Render NP  ?torsemide (DEMADEX) 20 MG tablet TAKE 2 TABLETS DAILY EVERY MORNING. TAKE AN ADDITIONAL 2 TABLETS AS NEEDED FOR WEIGHT GAIN OF 2 POUNDS OVERNIGHT OR 5 POUNDS IN ONE WEEK. ?Patient taking differently: Take 40 mg by mouth See admin instructions. TAKE 2 TABLETS DAILY EVERY MORNING. TAKE AN ADDITIONAL 2 TABLETS AS NEEDED FOR WEIGHT GAIN OF 2 POUNDS OVERNIGHT OR 5 POUNDS IN ONE WEEK. 10/01/20   WLoel Dubonnet NP  ? ? ?Physical Exam: ?Vitals:  ? 05/09/21 1130 05/09/21 1200 05/09/21 1215 05/09/21 1256  ?BP: 116/79 123/86 117/90 107/87  ?Pulse: (!) 107 (!)  104 98 93  ?Resp: (!) 31 (!) 28 19 (!) 30  ?Temp:      ?TempSrc:      ?SpO2: (!) 88% 92% 91% 95%  ?Weight:      ?Height:      ? ?General: 81y.o. female resting in bed in NAD ?Eyes: PERRL, normal sclera ?ENMT: Nares patent w/o discharge, orophaynx clear, dentition normal, ears w/o discharge/lesions/ulcers ?Neck: Supple, trachea midline ?Cardiovascular: RRR, +S1, S2, no m/g/r, equal pulses throughout ?Respiratory: scattered insp wheeze, no r/r, increased WOB on 3L ?GI: BS+, NDNT, no masses noted, no organomegaly noted ?MSK: No e/c/c ?Neuro: A&O x 3, no focal deficits ?Psyc: Appropriate interaction and affect, calm/cooperative ? ?Data Reviewed: ? ?K+  3.2 ?CO2  34 ?Glucose  160 ?BUN  27 ?Scr 0.78 ?BNN 166 ?Lactic acid  1.8 ?WBC  6.8 ?COVID/flu negative ? ?EKG: a fib, no st elevations ?CXR: Mild bibasilar atelectasis  or edema is noted with probable small pleural effusions. ? ?Assessment and Plan: ?No notes have been filed under this hospital service. ?Service: Hospitalist ?Dyspnea, multifactorial ?    - admit to inpt, tele ? ?Asthma exacerbation ?    - continue nebs, steroids ?    - her problems started with URI symptoms and she has sick exposure; COVID/flu are negative; check RVP and procal; she was given doxy in ED, let see procal before continuing ?    - guaifenesin ? ?Acute on chronic diastolic HF ?    - CXR as above ?    - lasix for now ?    - no chest pain; initial trp negative, trend ?    - check echo ?    - daily wts/I&O ? ?Hypokalemia ?    - replace K+, Mg2+ ok ? ?HTN ?    - continue home regimen when confirmed ? ?Hyperglycemia ?    - check A1c ? ?Anxiety ?    - continue home regimen when confirmed ? ?A fib on eliquis ?    - continue home regimen when confirmed  ? ?HLD ?    - continue home regimen when confirmed ? ?GERD ?    - PPI ? ? Advance Care Planning:   Code Status: FULL ? ?Consults: None ? ?Family Communication: None at bedside ? ?Severity of Illness: ?The appropriate patient status for this patient is  INPATIENT. Inpatient status is judged to be reasonable and necessary in order to provide the required intensity of service to ensure the patient's safety. The patient's presenting symptoms, physical exam

## 2021-05-09 NOTE — ED Triage Notes (Signed)
Pt c/o sob and productive cough x 1 week.  Denies pain.  Pt reports she is on 2L [place] "all the time."  Family member reports increasing SOB w/ exertion.  O2 dropped to 77% and heart rate increased into 120s on home O2 w/ ambulation last night.  ? ?Additionally, Pt is concerned about having an UTI,  ?

## 2021-05-09 NOTE — ED Notes (Signed)
[place] x2 sent to lab w/ new light green.  ?

## 2021-05-09 NOTE — ED Provider Notes (Signed)
?[facility] DEPT ?Provider Note ? ? ?CSN: [id number] ?Arrival date & time: 05/09/21  1018 ? ?  ? ?History ? ?No chief complaint on file. ? ? ?Lindsay [name] is a 81 y.o. female.  She has a history of COPD CHF paroxysmal A-fib on anticoagulation.  She uses 2 L of nasal cannula oxygen 24/7.  She has had increased shortness of breath for the last 5 days.  Dyspnea on exertion and sats dropped to 70s yesterday with even 10 feet of ambulation.  Cough productive of some green sputum.  Feeling feverish but no documented temperatures.  She went to a scheduled pulmonology visit today and they recommended she come to the emergency department for evaluation.  Nausea no vomiting.  No abdominal pain diarrhea constipation.  She does endorse some urinary frequency and hesitancy and feels she may have a UTI.  Fell yesterday and had a skin tear to her left arm.  Has not taken any of her medications today. ? ?The history is provided by the patient and a relative.  ?Shortness of Breath ?Severity:  Severe ?Onset quality:  Gradual ?Duration:  5 days ?Timing:  Constant ?Progression:  Worsening ?Chronicity:  Recurrent ?Relieved by:  Nothing ?Worsened by:  Activity and coughing ?Ineffective treatments:  Oxygen and diuretics ?Associated symptoms: cough, fever, sputum production and wheezing   ?Associated symptoms: no abdominal pain, no chest pain, no headaches and no hemoptysis   ? ?  ? ?Home Medications ?Prior to Admission medications   ?Medication Sig Start Date End Date Taking? Authorizing Provider  ?albuterol (VENTOLIN HFA) 108 (90 Base) MCG/ACT inhaler Inhale 1-2 puffs into the lungs every 6 (six) hours as needed for wheezing or shortness of breath. 10/21/20   Early, Coralee Pesa, NP  ?alendronate (FOSAMAX) 70 MG tablet Take 1 tablet (70 mg total) by mouth every Saturday. 03/12/21   [name], NP  ?apixaban (ELIQUIS) 5 MG TABS tablet Take 1 tablet (5 mg total) by mouth 2 (two) times daily. 10/21/20   [name],  NP  ?atorvastatin (LIPITOR) 20 MG tablet Take 1 tablet (20 mg total) by mouth every evening. 02/21/21   [name], NP  ?benzonatate (TESSALON) 200 MG capsule Take 1 capsule (200 mg total) by mouth 3 (three) times daily as needed for cough. 02/04/21   [name], NP  ?Calcium-Magnesium-Vitamin D (CALCIUM 1200+D3 PO) Take 1 tablet by mouth in the morning.    [provider]  ?cetirizine (ZYRTEC ALLERGY) 10 MG tablet Take 1 tablet (10 mg total) by mouth daily. 05/09/21   Freddi Starr, MD  ?dextromethorphan-guaiFENesin [facility] DM) 30-600 MG 12hr tablet Take 2 tablets by mouth 2 (two) times daily. 10/21/20   [name], NP  ?Elastic Bandages & Supports (MEDICAL COMPRESSION STOCKINGS) MISC Two pair knee high 30-51mHg compression stockings. Sized XL. As covered by insurance for ICD-10: I50.33, R60.0. 03/09/21   Early, SCoralee Pesa NP  ?ipratropium-albuterol (DUONEB) 0.5-2.5 (3) MG/3ML SOLN Inhale 3 mLs into the lungs every 6 (six) hours as needed (wheezing). 05/09/21   DFreddi Starr MD  ?LORazepam (ATIVAN) 1 MG tablet TAKE 1 TABLET BY MOUTH 2 TIMES DAILY AS NEEDED 03/07/21   Early, SCoralee Pesa NP  ?Menthol (COUGH DROPS) 5.8 MG LOZG Use as directed 1 lozenge (5.8 mg total) in the mouth or throat as needed (for dry, sore, or scratchy throat). 11/16/20   EOrma Render NP  ?metoprolol succinate (TOPROL-XL) 50 MG 24 hr tablet Take 1 tablet (  50 mg total) by mouth 2 (two) times daily. 03/09/21   [name], NP  ?mirtazapine (REMERON) 45 MG tablet Take 1 tablet (45 mg total) by mouth at bedtime. 02/21/21   [name], NP  ?Multiple Vitamin (MULTIVITAMIN WITH MINERALS) TABS tablet Take 1 tablet by mouth in the morning.    [provider]  ?OXYGEN Inhale 2 L into the lungs daily as needed (shortness of breath or activity level).    [provider]  ?pantoprazole (PROTONIX) 20 MG tablet Take 1 tablet (20 mg total) by mouth daily. 02/21/21   [name], NP  ?potassium chloride (KLOR-CON) 10  MEQ tablet Take 2 tablets (20 mEq total) by mouth daily. 08/24/20 02/20/21  Loel Dubonnet, NP  ?sertraline (ZOLOFT) 100 MG tablet Take 1 tablet (100 mg total) by mouth at bedtime. 02/21/21   [name], NP  ?torsemide (DEMADEX) 20 MG tablet TAKE 2 TABLETS DAILY EVERY MORNING. TAKE AN ADDITIONAL 2 TABLETS AS NEEDED FOR WEIGHT GAIN OF 2 POUNDS OVERNIGHT OR 5 POUNDS IN ONE WEEK. 10/01/20   Loel Dubonnet, NP  ?   ? ?Allergies    ?Acetaminophen-codeine and Codeine   ? ?Review of Systems   ?Review of Systems  ?Constitutional:  Positive for fever.  ?HENT:  Positive for rhinorrhea.   ?Eyes:  Negative for visual disturbance.  ?Respiratory:  Positive for cough, sputum production, shortness of breath and wheezing. Negative for hemoptysis.   ?Cardiovascular:  Positive for leg swelling. Negative for chest pain.  ?Gastrointestinal:  Positive for nausea. Negative for abdominal pain.  ?Genitourinary:  Positive for dysuria.  ?Musculoskeletal:  Positive for gait problem.  ?Skin:  Positive for wound.  ?Neurological:  Negative for headaches.  ? ?Physical Exam ?Updated Vital Signs ?BP 107/87   Pulse 93   Temp 97.8 ?F (36.6 ?C) (Oral)   Resp (!) 30   Ht '5\' 8"'$  (1.727 m)   Wt 106.6 kg   SpO2 95%   BMI 35.73 kg/m?  ?Physical Exam ?Vitals and nursing note reviewed.  ?Constitutional:   ?   General: She is not in acute distress. ?   Appearance: Normal appearance. She is well-developed.  ?HENT:  ?   Head: Normocephalic and atraumatic.  ?Eyes:  ?   Conjunctiva/sclera: Conjunctivae normal.  ?Cardiovascular:  ?   Rate and Rhythm: Tachycardia present. Rhythm irregular.  ?   Heart sounds: No murmur heard. ?Pulmonary:  ?   Effort: Tachypnea, accessory muscle usage and prolonged expiration present. No respiratory distress.  ?   Breath sounds: Wheezing present.  ?Abdominal:  ?   Palpations: Abdomen is soft.  ?   Tenderness: There is no abdominal tenderness.  ?Musculoskeletal:     ?   General: No swelling.  ?   Cervical back: Neck supple.  ?    Right lower leg: Edema present.  ?   Left lower leg: Edema present.  ?   Comments: Trace edema both feet no cords appreciated  ?Skin: ?   General: Skin is warm and dry.  ?   Capillary Refill: Capillary refill takes less than 2 seconds.  ?Neurological:  ?   General: No focal deficit present.  ?   Mental Status: She is alert.  ? ? ?ED Results / Procedures / Treatments   ?Labs ?(all labs ordered are listed, but only abnormal results are displayed) ?Labs Reviewed  ?BASIC METABOLIC PANEL - Abnormal; Notable for the following components:  ?    Result Value  ?  Potassium 3.2 (*)   ? CO2 34 (*)   ? Glucose, Bld 160 (*)   ? BUN 27 (*)   ? Calcium 8.7 (*)   ? All other components within normal limits  ?BRAIN NATRIURETIC PEPTIDE - Abnormal; Notable for the following components:  ? B Natriuretic Peptide 166.7 (*)   ? All other components within normal limits  ?RESP PANEL BY RT-PCR (FLU A&B, COVID) [id number]  ?CULTURE, BLOOD (ROUTINE X 2)  ?CULTURE, BLOOD (ROUTINE X 2)  ?URINE CULTURE  ?CBC WITH DIFFERENTIAL/PLATELET  ?LACTIC ACID, PLASMA  ?MAGNESIUM  ?URINALYSIS, ROUTINE W REFLEX MICROSCOPIC  ?TROPONIN I (HIGH SENSITIVITY)  ?TROPONIN I (HIGH SENSITIVITY)  ? ? ?EKG ?EKG Interpretation ? ?Date/Time:  Monday May 09 2021 10:49:38 EDT ?Ventricular Rate:  101 ?PR Interval:    ?QRS Duration: 86 ?QT Interval:  348 ?QTC Calculation: 452 ?R Axis:   -42 ?Text Interpretation: Atrial fibrillation Left anterior fascicular block Low voltage, precordial leads Abnormal R-wave progression, early transition Borderline T abnormalities, anterior leads Baseline wander in lead(s) V1 No significant change since last tracing Confirmed by Aletta Edouard [phone]) on 05/09/2021 11:03:07 AM ? ?Radiology ?DG Chest Port 1 View ? ?Result Date: 05/09/2021 ?CLINICAL DATA:  Shortness of breath. EXAM: PORTABLE CHEST 1 VIEW COMPARISON:  February 05, 2021. FINDINGS: Stable cardiomegaly. Bibasilar atelectasis or edema is noted with probable small pleural effusions.  Bony thorax is unremarkable. IMPRESSION: Mild bibasilar atelectasis or edema is noted with probable small pleural effusions. Electronically Signed   By: Marijo Conception M.D.   On: 05/09/2021 11:24   ? ?Procedures

## 2021-05-09 NOTE — ED Notes (Signed)
Lab notified of Respiratory Panel order and will run on COVID swab. Lab is a send out.  ?

## 2021-05-10 ENCOUNTER — Inpatient Hospital Stay (HOSPITAL_COMMUNITY): Payer: Medicare HMO

## 2021-05-10 DIAGNOSIS — R739 Hyperglycemia, unspecified: Secondary | ICD-10-CM

## 2021-05-10 DIAGNOSIS — K219 Gastro-esophageal reflux disease without esophagitis: Secondary | ICD-10-CM

## 2021-05-10 DIAGNOSIS — I5033 Acute on chronic diastolic (congestive) heart failure: Secondary | ICD-10-CM

## 2021-05-10 DIAGNOSIS — I480 Paroxysmal atrial fibrillation: Secondary | ICD-10-CM | POA: Diagnosis not present

## 2021-05-10 DIAGNOSIS — E785 Hyperlipidemia, unspecified: Secondary | ICD-10-CM

## 2021-05-10 DIAGNOSIS — J4521 Mild intermittent asthma with (acute) exacerbation: Secondary | ICD-10-CM

## 2021-05-10 DIAGNOSIS — R0602 Shortness of breath: Secondary | ICD-10-CM | POA: Diagnosis not present

## 2021-05-10 DIAGNOSIS — E876 Hypokalemia: Secondary | ICD-10-CM

## 2021-05-10 LAB — ECHOCARDIOGRAM COMPLETE
AR max vel: 3.32 cm2
AV Peak grad: 3.9 mmHg
Ao pk vel: 0.99 m/s
Area-P 1/2: 6.02 cm2
Calc EF: 62.5 %
Height: 68 in
S' Lateral: 3.2 cm
Single Plane A2C EF: 60.3 %
Single Plane A4C EF: 62.6 %
Weight: 3724.89 oz

## 2021-05-10 LAB — URINALYSIS, ROUTINE W REFLEX MICROSCOPIC
Bilirubin Urine: NEGATIVE
Glucose, UA: NEGATIVE mg/dL
Hgb urine dipstick: NEGATIVE
Ketones, ur: NEGATIVE mg/dL
Nitrite: NEGATIVE
Protein, ur: NEGATIVE mg/dL
Specific Gravity, Urine: 1.01 (ref 1.005–1.030)
pH: 6 (ref 5.0–8.0)

## 2021-05-10 LAB — COMPREHENSIVE METABOLIC PANEL
ALT: 15 U/L (ref 0–44)
AST: 20 U/L (ref 15–41)
Albumin: 3.4 g/dL — ABNORMAL LOW (ref 3.5–5.0)
Alkaline Phosphatase: 65 U/L (ref 38–126)
Anion gap: 8 (ref 5–15)
BUN: 27 mg/dL — ABNORMAL HIGH (ref 8–23)
CO2: 34 mmol/L — ABNORMAL HIGH (ref 22–32)
Calcium: 8.6 mg/dL — ABNORMAL LOW (ref 8.9–10.3)
Chloride: 98 mmol/L (ref 98–111)
Creatinine, Ser: 0.92 mg/dL (ref 0.44–1.00)
GFR, Estimated: >60 mL/min (ref >60)
Glucose, Bld: 152 mg/dL — ABNORMAL HIGH (ref 70–99)
Potassium: 4 mmol/L (ref 3.5–5.1)
Sodium: 140 mmol/L (ref 135–145)
Total Bilirubin: 0.8 mg/dL (ref 0.3–1.2)
Total Protein: 7.1 g/dL (ref 6.5–8.1)

## 2021-05-10 LAB — CBC
HCT: 39.5 % (ref 36.0–46.0)
Hemoglobin: 12.8 g/dL (ref 12.0–15.0)
MCH: 30.8 pg (ref 26.0–34.0)
MCHC: 32.4 g/dL (ref 30.0–36.0)
MCV: 95.2 fL (ref 80.0–100.0)
Platelets: 251 10*3/uL (ref 150–400)
RBC: 4.15 MIL/uL (ref 3.87–5.11)
RDW: 13.4 % (ref 11.5–15.5)
WBC: 5 10*3/uL (ref 4.0–10.5)
nRBC: 0 % (ref 0.0–0.2)

## 2021-05-10 LAB — PROCALCITONIN: Procalcitonin: <0.1 ng/mL

## 2021-05-10 MED ORDER — PHENOL 1.4 % MT LIQD
1.0000 | OROMUCOSAL | Status: DC | PRN
  Filled 2021-05-10: qty 177

## 2021-05-10 MED ORDER — IPRATROPIUM-ALBUTEROL 0.5-2.5 (3) MG/3ML IN SOLN
3.0000 mL | Freq: Three times a day (TID) | RESPIRATORY_TRACT | Status: DC
  Administered 2021-05-10 – 2021-05-13 (×9): 3 mL via RESPIRATORY_TRACT
  Filled 2021-05-10 (×10): qty 3

## 2021-05-10 MED ORDER — MIRTAZAPINE 30 MG PO TBDP
45.0000 mg | ORAL_TABLET | Freq: Once | ORAL | Status: AC
  Administered 2021-05-10: 45 mg via ORAL
  Filled 2021-05-10: qty 1

## 2021-05-10 MED ORDER — SERTRALINE HCL 100 MG PO TABS
100.0000 mg | ORAL_TABLET | Freq: Every day | ORAL | Status: DC
  Administered 2021-05-10 – 2021-05-12 (×3): 100 mg via ORAL
  Filled 2021-05-10 (×3): qty 1

## 2021-05-10 MED ORDER — APIXABAN 5 MG PO TABS
5.0000 mg | ORAL_TABLET | Freq: Two times a day (BID) | ORAL | Status: DC
  Administered 2021-05-10 – 2021-05-13 (×7): 5 mg via ORAL
  Filled 2021-05-10 (×7): qty 1

## 2021-05-10 MED ORDER — MIRTAZAPINE 30 MG PO TABS
45.0000 mg | ORAL_TABLET | Freq: Every day | ORAL | Status: DC
  Administered 2021-05-10 – 2021-05-12 (×3): 45 mg via ORAL
  Filled 2021-05-10 (×3): qty 1

## 2021-05-10 MED ORDER — BUDESONIDE 0.5 MG/2ML IN SUSP
0.5000 mg | Freq: Two times a day (BID) | RESPIRATORY_TRACT | Status: DC
  Administered 2021-05-10 – 2021-05-13 (×6): 0.5 mg via RESPIRATORY_TRACT
  Filled 2021-05-10 (×6): qty 2

## 2021-05-10 MED ORDER — MAGIC MOUTHWASH
10.0000 mL | Freq: Three times a day (TID) | ORAL | Status: AC
  Administered 2021-05-10 – 2021-05-11 (×3): 10 mL via ORAL
  Filled 2021-05-10 (×3): qty 10

## 2021-05-10 MED ORDER — ATORVASTATIN CALCIUM 20 MG PO TABS
20.0000 mg | ORAL_TABLET | Freq: Every evening | ORAL | Status: DC
  Administered 2021-05-10 – 2021-05-12 (×3): 20 mg via ORAL
  Filled 2021-05-10 (×3): qty 1

## 2021-05-10 MED ORDER — DM-GUAIFENESIN ER 30-600 MG PO TB12: 1.0000 | ORAL_TABLET | Freq: Two times a day (BID) | ORAL | Status: DC | PRN

## 2021-05-10 MED ORDER — METOPROLOL TARTRATE 5 MG/5ML IV SOLN: 5.0000 mg | INTRAVENOUS | Status: DC | PRN

## 2021-05-10 MED ORDER — OXYCODONE HCL 5 MG PO TABS: 5.0000 mg | ORAL_TABLET | ORAL | Status: DC | PRN

## 2021-05-10 MED ORDER — TRAZODONE HCL 50 MG PO TABS
50.0000 mg | ORAL_TABLET | Freq: Every evening | ORAL | Status: DC | PRN
  Administered 2021-05-10 – 2021-05-12 (×3): 50 mg via ORAL
  Filled 2021-05-10 (×3): qty 1

## 2021-05-10 MED ORDER — PANTOPRAZOLE SODIUM 20 MG PO TBEC
20.0000 mg | DELAYED_RELEASE_TABLET | Freq: Every day | ORAL | Status: DC
Start: 2021-05-10 — End: 2021-05-13
  Administered 2021-05-10 – 2021-05-13 (×4): 20 mg via ORAL
  Filled 2021-05-10 (×4): qty 1

## 2021-05-10 MED ORDER — SENNOSIDES-DOCUSATE SODIUM 8.6-50 MG PO TABS: 1.0000 | ORAL_TABLET | Freq: Every evening | ORAL | Status: DC | PRN

## 2021-05-10 MED ORDER — LORAZEPAM 1 MG PO TABS
1.0000 mg | ORAL_TABLET | Freq: Two times a day (BID) | ORAL | Status: DC | PRN
  Administered 2021-05-10 – 2021-05-12 (×4): 1 mg via ORAL
  Filled 2021-05-10 (×4): qty 1

## 2021-05-10 MED ORDER — IPRATROPIUM-ALBUTEROL 0.5-2.5 (3) MG/3ML IN SOLN
3.0000 mL | Freq: Four times a day (QID) | RESPIRATORY_TRACT | Status: DC
  Administered 2021-05-10 (×2): 3 mL via RESPIRATORY_TRACT
  Filled 2021-05-10 (×2): qty 3

## 2021-05-10 MED ORDER — LORATADINE 10 MG PO TABS: 10.0000 mg | ORAL_TABLET | Freq: Every day | ORAL | Status: DC

## 2021-05-10 MED ORDER — METOPROLOL SUCCINATE ER 50 MG PO TB24
50.0000 mg | ORAL_TABLET | Freq: Two times a day (BID) | ORAL | Status: DC
  Administered 2021-05-10 – 2021-05-13 (×7): 50 mg via ORAL
  Filled 2021-05-10 (×7): qty 1

## 2021-05-10 MED ORDER — HYDRALAZINE HCL 20 MG/ML IJ SOLN: 10.0000 mg | INTRAMUSCULAR | Status: DC | PRN

## 2021-05-10 NOTE — Evaluation (Signed)
Physical Therapy Evaluation ?Patient Details ?Name: Lindsay [name] ?MRN: [id number] ?DOB: [birth year] ?Today's Date: 05/10/2021 ? ?History of Present Illness ? Patient is a 81 year old female who presented to the hosptial with progressive dyspnea on 05/09/21. Patient was found to have acute on chronic hypoxia, metapneumovirus,and asthma exacerbation. PMH: asthma, CHF,  ?Clinical Impression ? Pt admitted with above diagnosis. At baseline, pt resides in [facility]) and is ambulatory to dining hall with RW, no AD in room.  She wears 2 L O2 baseline and is on 3 L at this time.  Today, pt with decreased activity tolerance and only able to take a few steps to the chair with assist.  Her HR increased to 137 bpm with minimal activity.  Pt expected to progress well with therapy.  Encouraged OOB with nursing as able.  Pt currently with functional limitations due to the deficits listed below (see PT Problem List). Pt will benefit from skilled PT to increase their independence and safety with mobility to allow discharge to the venue listed below.   ?   ?   ? ?Recommendations for follow up therapy are one component of a multi-disciplinary discharge planning process, led by the attending physician.  Recommendations may be updated based on patient status, additional functional criteria and insurance authorization. ? ?Follow Up Recommendations Home health PT ? ?  ?Assistance Recommended at Discharge None  ?Patient can return home with the following ? A little help with walking and/or transfers;A little help with bathing/dressing/bathroom;Assistance with cooking/housework ? ?  ?Equipment Recommendations None recommended by PT  ?Recommendations for Other Services ?    ?  ?Functional Status Assessment Patient has had a recent decline in their functional status and demonstrates the ability to make significant improvements in function in a reasonable and predictable amount of time.  ? ?  ?Precautions / Restrictions  Precautions ?Precautions: Fall ?Precaution Comments: monitor O2  ? ?  ? ?Mobility ? Bed Mobility ?Overal bed mobility: Needs Assistance ?Bed Mobility: Supine to Sit, Sit to Supine ?  ?  ?Supine to sit: Supervision, HOB elevated ?  ?  ?General bed mobility comments: with increased time ?  ? ?Transfers ?Overall transfer level: Needs assistance ?Equipment used: None ?Transfers: Sit to/from Stand ?Sit to Stand: Min assist ?  ?  ?  ?  ?  ?General transfer comment: light min A to steady ?  ? ?Ambulation/Gait ?Ambulation/Gait assistance: Min guard ?Gait Distance (Feet): 4 Feet ?Assistive device: 1 person hand held assist ?Gait Pattern/deviations: Step-to pattern, Decreased stride length ?Gait velocity: decreased ?  ?  ?General Gait Details: small steps to chair; declined further due to fatigue/shortness of breath ? ?Stairs ?  ?  ?  ?  ?  ? ?Wheelchair Mobility ?  ? ?Modified Rankin (Stroke Patients Only) ?  ? ?  ? ?Balance Overall balance assessment: Needs assistance ?Sitting-balance support: No upper extremity supported ?Sitting balance-Leahy Scale: Good ?  ?  ?Standing balance support: Single extremity supported ?Standing balance-Leahy Scale: Poor ?Standing balance comment: reliant on UE support ?  ?  ?  ?  ?  ?  ?  ?  ?  ?  ?  ?   ? ? ? ?Pertinent Vitals/Pain    ? ? ?Home Living Family/patient expects to be discharged to:: Other (Comment) (retirement community) ?Living Arrangements: Alone ?Available Help at Discharge: Family;Available PRN/intermittently ?Type of Home: Independent living facility ?Home Access: Elevator ?  ?  ?  ?Home Layout: One level ?Home Equipment:  Rollator (4 wheels);Toilet riser ?Additional Comments: on 2L O2 at baseline  ?  ?Prior Function Prior Level of Function : Independent/Modified Independent ?  ?  ?  ?  ?  ?  ?Mobility Comments: uses rollator outside of her room ; can ambulate to dining hall ?ADLs Comments: independent with ADLs and light IADLs; facility provides meals ?  ? ? ?Hand Dominance   ?   ? ?  ?Extremity/Trunk Assessment  ? Upper Extremity Assessment ?Upper Extremity Assessment: Defer to OT evaluation ?  ? ?Lower Extremity Assessment ?Lower Extremity Assessment: LLE deficits/detail;RLE deficits/detail ?RLE Deficits / Details: ROM WFL; MMT 4+/5 ?LLE Deficits / Details: ROM WFL; MMT 4+/5 ?  ? ?Cervical / Trunk Assessment ?Cervical / Trunk Assessment: Kyphotic  ?Communication  ? Communication: No difficulties  ?Cognition   ?  ?  ?  ?  ?  ?  ?  ?  ?  ?  ?  ?  ?  ?  ?  ?  ?  ?  ?  ?  ?  ? ?  ?General Comments General comments (skin integrity, edema, etc.): Pt's HR 100 bpm rest and up to 137 bpm with transfer.  BP was 127/72 in chair. Pt on 3 L O2 with sats remaining 90% or greater ? ?  ?Exercises    ? ?Assessment/Plan  ?  ?PT Assessment Patient needs continued PT services  ?PT Problem List Decreased strength;Decreased mobility;Decreased balance;Decreased activity tolerance;Cardiopulmonary status limiting activity;Decreased knowledge of use of DME ? ?   ?  ?PT Treatment Interventions DME instruction;Therapeutic activities;Modalities;Gait training;Therapeutic exercise;Patient/family education;Balance training;Functional mobility training   ? ?PT Goals (Current goals can be found in the Care Plan section)  ?Acute Rehab PT Goals ?Patient Stated Goal: return home ?PT Goal Formulation: With patient ?Time For Goal Achievement: 05/24/21 ?Potential to Achieve Goals: Good ? ?  ?Frequency Min 3X/week ?  ? ? ?Co-evaluation   ?  ?  ?  ?  ? ? ?  ?AM-PAC PT "6 Clicks" Mobility  ?Outcome Measure Help needed turning from your back to your side while in a flat bed without using bedrails?: A Little ?Help needed moving from lying on your back to sitting on the side of a flat bed without using bedrails?: A Little ?Help needed moving to and from a bed to a chair (including a wheelchair)?: A Little ?Help needed standing up from a chair using your arms (e.g., wheelchair or bedside chair)?: A Little ?Help needed to walk in  hospital room?: Total ?Help needed climbing 3-5 steps with a railing? : Total ?6 Click Score: 14 ? ?  ?End of Session Equipment Utilized During Treatment: Oxygen ?Activity Tolerance: Patient tolerated treatment well ?Patient left: with chair alarm set;in chair;with call bell/phone within reach ?Nurse Communication: Mobility status ?PT Visit Diagnosis: Other abnormalities of gait and mobility (R26.89);Muscle weakness (generalized) (M62.81) ?  ? ?Time: 3888-2800 ?PT Time Calculation (min) (ACUTE ONLY): 24 min ? ? ?Charges:   PT Evaluation ?$PT Eval Low Complexity: 1 Low ?PT Treatments ?$Therapeutic Activity: 8-22 mins ?  ?   ? ? ?Abran Richard, PT ?Acute Rehab Services ?Pager [phone] ?[facility] [phone] ? ? ?Lindsay [name] ?05/10/2021, 3:37 PM ? ?

## 2021-05-10 NOTE — Evaluation (Signed)
Occupational Therapy Evaluation ?Patient Details ?Name: Lindsay [name] ?MRN: [id number] ?DOB: 05/05/1940 ?Today's Date: 05/10/2021 ? ? ?History of Present Illness Patient is a 81 year old female who presented to the hosptial with progressive dyspnea. patient was found to have acute on chronic hypoxia, metapneumovirus,and asthma exacerbation. PMH: asthma, CHF,  ? ?Clinical Impression ?  ?Patient is a pleasant 81 year old female who was admitted for above. Patient was noted to have decreased functional activity tolerance with increased need for supplemental O2 and increased HR to 140s with sitting EOB. Patients session was limited secondary to HR on this date.  Patient lives at home in [place]. Patient would continue to benefit from skilled OT services at this time while admitted and after d/c to address noted deficits in order to improve overall safety and independence in ADLs.  ?  ?   ? ?Recommendations for follow up therapy are one component of a multi-disciplinary discharge planning process, led by the attending physician.  Recommendations may be updated based on patient status, additional functional criteria and insurance authorization.  ? ?Follow Up Recommendations ? Home health OT  ?  ?Assistance Recommended at Discharge Frequent or constant Supervision/Assistance  ?Patient can return home with the following A lot of help with bathing/dressing/bathroom;Assistance with cooking/housework;Help with stairs or ramp for entrance;Direct supervision/assist for medications management;Assist for transportation ? ?  ?Functional Status Assessment ? Patient has had a recent decline in their functional status and demonstrates the ability to make significant improvements in function in a reasonable and predictable amount of time.  ?Equipment Recommendations ? Other (comment) (TBD)  ?  ?Recommendations for Other Services   ? ? ?  ?Precautions / Restrictions Precautions ?Precaution Comments: monitor O2  ? ?   ? ?Mobility Bed Mobility ?Overal bed mobility: Needs Assistance ?Bed Mobility: Supine to Sit, Sit to Supine ?  ?  ?Supine to sit: Supervision, HOB elevated ?Sit to supine: Supervision ?  ?General bed mobility comments: with increased time ?  ? ?Transfers ?  ?  ?  ?  ?  ?  ?  ?  ?  ?  ?  ? ?  ?Balance Overall balance assessment: Needs assistance ?Sitting-balance support: No upper extremity supported ?Sitting balance-Leahy Scale: Good ?  ?  ?Standing balance support: Bilateral upper extremity supported ?Standing balance-Leahy Scale: Poor ?  ?  ?  ?  ?  ?  ?  ?  ?  ?  ?  ?  ?   ? ?ADL either performed or assessed with clinical judgement  ? ?ADL Overall ADL's : Needs assistance/impaired ?Eating/Feeding: Set up;Sitting ?  ?Grooming: Dance movement psychotherapist;Wash/dry hands;Sitting ?Grooming Details (indicate cue type and reason): EOB with HR noted to increase to 140 bpm. with activity stopped and patient returned to bed. ?Upper Body Bathing: Min guard;Sitting ?  ?Lower Body Bathing: Sit to/from stand;Minimal assistance ?Lower Body Bathing Details (indicate cue type and reason): patietn able to reach below knees but difficulty reaching feet after incontinence episode. ?Upper Body Dressing : Min guard;Sitting ?  ?Lower Body Dressing: Maximal assistance ?Lower Body Dressing Details (indicate cue type and reason): max A for donning/doffing socks ?  ?Toilet Transfer Details (indicate cue type and reason): unable to attempt with HR increased to 140 bpm ?Toileting- Clothing Manipulation and Hygiene: Maximal assistance;Sit to/from stand ?  ?  ?  ?  ?   ? ? ? ?Vision Baseline Vision/History: 1 Wears glasses ?Patient Visual Report: No change from baseline ?   ?   ?  Perception   ?  ?Praxis   ?  ? ?Pertinent Vitals/Pain Pain Assessment ?Pain Assessment: No/denies pain  ? ? ? ?Hand Dominance Right ?  ?Extremity/Trunk Assessment Upper Extremity Assessment ?Upper Extremity Assessment: Overall WFL for tasks assessed (noted to have large skin tear  on posterior L elbow) ?  ?Lower Extremity Assessment ?Lower Extremity Assessment: Defer to PT evaluation ?  ?Cervical / Trunk Assessment ?Cervical / Trunk Assessment: Kyphotic ?  ?Communication Communication ?Communication: No difficulties ?  ?Cognition Arousal/Alertness: Awake/alert ?Behavior During Therapy: [facility] for tasks assessed/performed ?Overall Cognitive Status: Within Functional Limits for tasks assessed ?  ?  ?  ?  ?  ?  ?  ?  ?  ?  ?  ?  ?  ?  ?  ?  ?  ?  ?  ?General Comments    ? ?  ?Exercises   ?  ?Shoulder Instructions    ? ? ?Home Living Family/patient expects to be discharged to:: Other (Comment) (retirment community) ?  ?  ?  ?  ?  ?  ?  ?  ?  ?  ?  ?  ?  ?  ?Home Equipment: Rollator (4 wheels);Toilet riser ?  ?Additional Comments: on 2L O2 at baseline ?  ? ?  ?Prior Functioning/Environment Prior Level of Function : Independent/Modified Independent ?  ?  ?  ?  ?  ?  ?  ?  ?  ? ?  ?  ?OT Problem List: Decreased activity tolerance;Decreased coordination;Cardiopulmonary status limiting activity;Decreased knowledge of precautions;Decreased safety awareness;Decreased knowledge of use of DME or AE ?  ?   ?OT Treatment/Interventions: Self-care/ADL training;DME and/or AE instruction;Therapeutic activities;Balance training;Therapeutic exercise;Neuromuscular education;Energy conservation  ?  ?OT Goals(Current goals can be found in the care plan section) Acute Rehab OT Goals ?Patient Stated Goal: to get back to [place] ?OT Goal Formulation: With patient ?Time For Goal Achievement: 05/24/21 ?Potential to Achieve Goals: Good  ?OT Frequency: Min 2X/week ?  ? ?Co-evaluation   ?  ?  ?  ?  ? ?  ?AM-PAC OT "6 Clicks" Daily Activity     ?Outcome Measure Help from another person eating meals?: None ?Help from another person taking care of personal grooming?: A Little ?Help from another person toileting, which includes using toliet, bedpan, or urinal?: A Lot ?Help from another person bathing (including washing,  rinsing, drying)?: A Lot ?Help from another person to put on and taking off regular upper body clothing?: A Little ?Help from another person to put on and taking off regular lower body clothing?: A Lot ?6 Click Score: 16 ?  ?End of Session Nurse Communication: Other (comment) (ok to participate in session. HR during session) ? ?Activity Tolerance: Patient tolerated treatment well ?Patient left: in bed;with call bell/phone within reach;with nursing/sitter in room ? ?OT Visit Diagnosis: Unsteadiness on feet (R26.81);Muscle weakness (generalized) (M62.81)  ?              ?Time: 7209-4709 ?OT Time Calculation (min): 20 min ?Charges:  OT General Charges ?$OT Visit: 1 Visit ?OT Evaluation ?$OT Eval Moderate Complexity: 1 Mod ? ?Shelena Castelluccio OTR/L, MS ?Acute Rehabilitation Department ?Office# [phone] ?Pager# [phone] ? ? ?[place] ?05/10/2021, 12:00 PM ?

## 2021-05-10 NOTE — Progress Notes (Signed)
?PROGRESS NOTE ? ? ? ?Lindsay [name]  WLS:[id number] DOB: [birth year] DOA: 05/09/2021 ?PCP: [name], NP  ? ?Brief Narrative:  ?81 year old with past medical history of asthma, diastolic CHF, HLD, A-fib on Eliquis, [name], HTN, [name] ? ? ?Assessment & Plan: ? Principal Problem: ?  Dyspnea ?Active Problems: ?  Hypokalemia ?  Asthma exacerbation ?  AF (paroxysmal atrial fibrillation) ([place]) ?  Chronic anticoagulation ?  Acute on chronic diastolic CHF (congestive heart failure) ([place]) ?  GAD (generalized anxiety disorder) ?  GERD (gastroesophageal reflux disease) ?  HLD (hyperlipidemia) ?  Hyperglycemia ?  ? ?Dyspnea, multifactorial ?Acute on chronic hypoxia.  2 L nasal cannula at home ?-Multifactorial in nature with combination of asthma exacerbation and mild CHF exacerbation.  Patient desaturating down to 85% on 2 L nasal cannula therefore on 3 L currently.  Still has abnormal breath sounds. ?  ?Asthma exacerbation ?Viral bronchitis, metapneumovirus ?-Likely precipitated after URI.  Respiratory panel is positive for metapneumovirus.  Procalcitonin levels are negative. ?- We will place patient on aggressive bronchodilators, steroids, I-S/flutter valve.  Supportive care.  Supplemental oxygen as needed. ? ?Acute on chronic diastolic HF, ejection fraction ?-Chest x-ray showing small bilateral effusion.  On gentle diuretics. ?  ?Hypokalemia ?-Replete as necessary ?  ?Essential hypertension ?-We will resume home medication.  IV hydralazine and Lopressor as needed ?  ?Hyperglycemia ?-A1c pending ?  ?Anxiety ?-Continue home meds ?  ?Chronic A fib on eliquis ?-Continue home meds.  Toprol-XL.  Eliquis. ?  ?HLD ?-Continue home  statin ?  ?GERD ?  - PPI ? ? ? ?DVT prophylaxis: Continue Eliquis ?Code Status: Full code ?Family Communication:   ? ?Status is: Inpatient ?Remains inpatient appropriate because: Still dyspneic with minimal exertion and abnormal breath sounds.  Likely will take 2-4 days to improve ? ? ? ? ? ?Subjective: ?Exertional dyspnea and coughing.  Remains afebrile overnight. ? ? ?Examination: ? ?Constitutional: Not in acute distress, 3 L nasal cannula ?Respiratory: Diffuse bilateral rhonchi.  Kyphotic ?Cardiovascular: Normal sinus rhythm, no rubs ?Abdomen: Nontender nondistended good bowel sounds ?Musculoskeletal: No edema noted ?Skin: No rashes seen ?Neurologic: CN 2-12 grossly intact.  And nonfocal ?Psychiatric: Normal judgment and insight. Alert and oriented x 3. Normal mood. ? ?Objective: ?Vitals:  ? 05/09/21 2213 05/10/21 0321 05/10/21 8115 05/10/21 0801  ?BP: 119/81 124/73 103/63   ?Pulse: 96 (!) 103 (!) 104   ?Resp: '18 18 20   '$ ?Temp: (!) 97.5 ?F (36.4 ?C) (!) 97.4 ?F (36.3 ?C) (!) 97.4 ?F (36.3 ?C)   ?TempSrc: Oral Oral Oral   ?SpO2: 93% 96% 96% 97%  ?Weight:  105.6 kg    ?Height:      ? ?No intake or output data in the 24 hours ending 05/10/21 0829 ?Filed Weights  ? 05/09/21 1051 05/10/21 0321  ?Weight: 106.6 kg 105.6 kg  ? ? ? ?Data Reviewed:  ? ?CBC: ?Recent Labs  ?Lab 05/09/21 ?1100 05/10/21 ?7262  ?WBC 6.8 5.0  ?NEUTROABS 5.4  --   ?HGB 13.1 12.8  ?HCT 40.4 39.5  ?MCV 95.5 95.2  ?PLT 224 251  ? ?Basic Metabolic Panel: ?Recent Labs  ?Lab 05/09/21 ?1100 05/10/21 ?0355  ?NA 139 140  ?K 3.2*  4.0  ?CL 98 98  ?CO2 34* 34*  ?GLUCOSE 160* 152*  ?BUN 27* 27*  ?CREATININE 0.78 0.92  ?CALCIUM 8.7* 8.6*  ?MG 2.1  --   ? ?GFR: ?Estimated Creatinine Clearance: 62.1 mL/min (by C-G formula based on SCr of 0.92 mg/dL). ?Liver Function Tests: ?Recent Labs  ?Lab 05/10/21 ?1610  ?AST 20  ?ALT 15  ?ALKPHOS 65  ?BILITOT 0.8  ?PROT 7.1  ?ALBUMIN 3.4*  ? ?No results for input(s): LIPASE, AMYLASE in the last 168 hours. ?No results for  input(s): AMMONIA in the last 168 hours. ?Coagulation Profile: ?No results for input(s): INR, PROTIME in the last 168 hours. ?Cardiac Enzymes: ?No results for input(s): CKTOTAL, CKMB, CKMBINDEX, TROPONINI in the last 168 hours. ?BNP (last 3 results) ?No results for input(s): PROBNP in the last 8760 hours. ?HbA1C: ?Recent Labs  ?  05/09/21 ?1921  ?HGBA1C 5.5  ? ?CBG: ?No results for input(s): GLUCAP in the last 168 hours. ?Lipid Profile: ?No results for input(s): CHOL, HDL, LDLCALC, TRIG, CHOLHDL, LDLDIRECT in the last 72 hours. ?Thyroid Function Tests: ?No results for input(s): TSH, T4TOTAL, FREET4, T3FREE, THYROIDAB in the last 72 hours. ?Anemia Panel: ?No results for input(s): VITAMINB12, FOLATE, FERRITIN, TIBC, IRON, RETICCTPCT in the last 72 hours. ?Sepsis Labs: ?Recent Labs  ?Lab 05/09/21 ?1100 05/10/21 ?9604  ?PROCALCITON  --  <0.10  ?LATICACIDVEN 1.8  --   ? ? ?Recent Results (from the past 240 hour(s))  ?Resp Panel by RT-PCR (Flu A&B, Covid) Nasopharyngeal Swab     Status: None  ? Collection Time: 05/09/21 11:00 AM  ? Specimen: Nasopharyngeal Swab; Nasopharyngeal(NP) swabs in vial transport medium  ?Result Value Ref Range Status  ? SARS Coronavirus 2 by RT PCR NEGATIVE NEGATIVE Final  ?  Comment: (NOTE) ?SARS-CoV-2 target nucleic acids are NOT DETECTED. ? ?The SARS-CoV-2 RNA is generally detectable in upper respiratory ?specimens during the acute phase of infection. The lowest ?concentration of SARS-CoV-2 viral copies this assay can detect is ?138 copies/mL. A negative result does not preclude SARS-Cov-2 ?infection and should not be used as the sole basis for treatment or ?other patient management decisions. A negative result may occur with  ?improper specimen collection/handling, submission of specimen other ?than nasopharyngeal swab, presence of viral mutation(s) within the ?areas targeted by this assay, and inadequate number of viral ?copies(<138 copies/mL). A negative result must be combined  with ?clinical observations, patient history, and epidemiological ?information. The expected result is Negative. ? ?Fact Sheet for Patients:  ?EntrepreneurPulse.com.au ? ?Fact Sheet for Healthcare Providers:  ?IncredibleEmployment.be ? ?This test is no t yet approved or cleared by the [place] FDA and  ?has been authorized for detection and/or diagnosis of SARS-CoV-2 by ?FDA under an Emergency Use Authorization (EUA). This EUA will remain  ?in effect (meaning this test can be used) for the duration of the ?COVID-19 declaration under Section 564(b)(1) of the Act, 21 ?U.S.C.section 360bbb-3(b)(1), unless the authorization is terminated  ?or revoked sooner.  ? ? ?  ? Influenza A by PCR NEGATIVE NEGATIVE Final  ? Influenza B by PCR NEGATIVE NEGATIVE Final  ?  Comment: (NOTE) ?The Xpert Xpress SARS-CoV-2/FLU/RSV plus assay is intended as an aid ?in the diagnosis of influenza from Nasopharyngeal swab specimens and ?should not be used as a sole basis for treatment. Nasal washings and ?aspirates are unacceptable for Xpert Xpress SARS-CoV-2/FLU/RSV ?testing. ? ?Fact Sheet for Patients: ?EntrepreneurPulse.com.au ? ?Fact Sheet for Healthcare Providers: ?IncredibleEmployment.be ? ?This test is not yet approved or cleared by  the [employer] and ?has been authorized for detection and/or diagnosis of SARS-CoV-2 by ?FDA under an Emergency Use Authorization (EUA). This EUA will remain ?in effect (meaning this test can be used) for the duration of the ?COVID-19 declaration under Section 564(b)(1) of the Act, 21 U.S.C. ?section 360bbb-3(b)(1), unless the authorization is terminated or ?revoked. ? ?Performed at [facility], [address] ?[place], [place] [address] ?  ?Respiratory (~20 pathogens) panel by PCR     Status: Abnormal  ? Collection Time: 05/09/21 11:00 AM  ? Specimen: Nasopharyngeal Swab; Respiratory  ?Result Value Ref  Range Status  ? Adenovirus NOT DETECTED NOT DETECTED Final  ? Coronavirus 229E NOT DETECTED NOT DETECTED Final  ?  Comment: (NOTE) ?The Coronavirus on the Respiratory Panel, DOES NOT test for the novel  ?Coronavirus (201

## 2021-05-11 DIAGNOSIS — R0602 Shortness of breath: Secondary | ICD-10-CM | POA: Diagnosis not present

## 2021-05-11 DIAGNOSIS — E1159 Type 2 diabetes mellitus with other circulatory complications: Secondary | ICD-10-CM

## 2021-05-11 DIAGNOSIS — I10 Essential (primary) hypertension: Secondary | ICD-10-CM

## 2021-05-11 DIAGNOSIS — F419 Anxiety disorder, unspecified: Secondary | ICD-10-CM

## 2021-05-11 LAB — URINE CULTURE: Culture: <10000 — AB

## 2021-05-11 LAB — CBC
HCT: 44.5 % (ref 36.0–46.0)
Hemoglobin: 14 g/dL (ref 12.0–15.0)
MCH: 30.1 pg (ref 26.0–34.0)
MCHC: 31.5 g/dL (ref 30.0–36.0)
MCV: 95.7 fL (ref 80.0–100.0)
Platelets: 284 10*3/uL (ref 150–400)
RBC: 4.65 MIL/uL (ref 3.87–5.11)
RDW: 13.6 % (ref 11.5–15.5)
WBC: 8.6 10*3/uL (ref 4.0–10.5)
nRBC: 0 % (ref 0.0–0.2)

## 2021-05-11 LAB — BASIC METABOLIC PANEL
Anion gap: 7 (ref 5–15)
BUN: 31 mg/dL — ABNORMAL HIGH (ref 8–23)
CO2: 39 mmol/L — ABNORMAL HIGH (ref 22–32)
Calcium: 9 mg/dL (ref 8.9–10.3)
Chloride: 96 mmol/L — ABNORMAL LOW (ref 98–111)
Creatinine, Ser: 0.94 mg/dL (ref 0.44–1.00)
GFR, Estimated: >60 mL/min (ref >60)
Glucose, Bld: 105 mg/dL — ABNORMAL HIGH (ref 70–99)
Potassium: 4.2 mmol/L (ref 3.5–5.1)
Sodium: 142 mmol/L (ref 135–145)

## 2021-05-11 LAB — MAGNESIUM: Magnesium: 2.3 mg/dL (ref 1.7–2.4)

## 2021-05-11 NOTE — Assessment & Plan Note (Signed)
Ativan prn ?Remeron, zoloft, trazodone ?

## 2021-05-11 NOTE — Assessment & Plan Note (Signed)
>>  ASSESSMENT AND PLAN FOR GENERALIZED ANXIETY DISORDER WRITTEN ON 06/11/2022 12:29 PM BY Jamesen Stahnke E, NP  >>ASSESSMENT AND PLAN FOR ANXIETY WRITTEN ON 05/11/2021  3:51 PM BY POWELL, A CALDWELL JR., MD  Ativan  prn Remeron , zoloft , trazodone

## 2021-05-11 NOTE — Discharge Instructions (Signed)

## 2021-05-11 NOTE — Assessment & Plan Note (Signed)
>>  ASSESSMENT AND PLAN FOR ANXIETY WRITTEN ON 05/11/2021  3:51 PM BY POWELL, A CALDWELL JR., MD  Ativan prn Remeron, zoloft, trazodone

## 2021-05-11 NOTE — Assessment & Plan Note (Signed)
Metop, ?

## 2021-05-11 NOTE — Assessment & Plan Note (Signed)
CXR with bilateral edema/effusions ?Diuresis as tolerated ?

## 2021-05-11 NOTE — Assessment & Plan Note (Signed)
Due to metapneumovirus ?Steroids, nebs scheduled and prn ?

## 2021-05-11 NOTE — Assessment & Plan Note (Signed)
>>  ASSESSMENT AND PLAN FOR AF (PAROXYSMAL ATRIAL FIBRILLATION) ([facility]) WRITTEN ON 05/11/2021  3:50 PM BY POWELL, A CALDWELL JR., MD  Eliquis, metop

## 2021-05-11 NOTE — Assessment & Plan Note (Addendum)
Baseline on 2 L ?Currently on 3 L ?Due to metapneumovirus and asthma exacerbation/bronchitis ?Wean as tolerated ? ?

## 2021-05-11 NOTE — Assessment & Plan Note (Signed)
PPI ?

## 2021-05-11 NOTE — Progress Notes (Signed)
?PROGRESS NOTE ? ? ? ?Lindsay [name]  PIR:[id number] DOB: 04-21-1940 DOA: 05/09/2021 ?PCP: [name], NP  ?Chief Complaint  ?Patient presents with  ? Shortness of Breath  ? ? ?Brief Narrative:  ?81 year old with past medical history of asthma, diastolic CHF, HLD, Lindsay [name]-fib on Eliquis, [name], HTN, [name] ?   ? ? ?Assessment & Plan: ?  ?Principal Problem: ?  Dyspnea ?Active Problems: ?  Acute on chronic respiratory failure with hypoxia (HCC) ?  Asthma exacerbation ?  Acute on chronic diastolic CHF (congestive heart failure) ([place]) ?  AF (paroxysmal atrial fibrillation) ([place]) ?  Hypokalemia ?  Essential hypertension ?  Hyperglycemia ?  Anxiety ?  HLD (hyperlipidemia) ?  GERD (gastroesophageal reflux disease) ?  Chronic anticoagulation ?  GAD (generalized anxiety disorder) ? ? ?Assessment and Plan: ?Acute on chronic respiratory failure with hypoxia ([place]) ?Baseline on 2 L ?Currently on 3 L ?Due to metapneumovirus and asthma exacerbation/bronchitis ?Wean as tolerated ? ? ?Asthma exacerbation ?Due to metapneumovirus ?Steroids, nebs scheduled and prn ? ?Acute on chronic diastolic CHF (congestive heart failure) ([place]) ?CXR with bilateral edema/effusions ?Diuresis as tolerated ? ?AF (paroxysmal atrial fibrillation) ([place]) ?Eliquis, metop ? ?Essential hypertension ?Metop, ? ?Anxiety ?Ativan prn ?Remeron, zoloft, trazodone ? ?HLD (hyperlipidemia) ?lipitor ? ?GERD (gastroesophageal reflux disease) ?PPI ? ? ?DVT prophylaxis: eliquis ?Code Status: full ?Family Communication: none ?Disposition:  ? ?Status is: Inpatient ?Remains inpatient appropriate because: continued SOB ?  ?Consultants:  ?none ? ?Procedures:  ?none ? ?Antimicrobials:   ?Anti-infectives (From admission, onward)  ? ? Start     Dose/Rate Route Frequency Ordered Stop  ? 05/09/21 1245  doxycycline (VIBRA-TABS) tablet 100 mg       ? 100 mg Oral  Once 05/09/21 1236 05/09/21 1259  ? ?  ? ? ?Subjective: ?No new complaints, asks me to call her daughter ? ?Objective: ?Vitals:  ? 05/11/21 0630 05/11/21 0751 05/11/21 1420 05/11/21 1439  ?BP: 121/79   114/77  ?Pulse: 82   64  ?Resp: 16   (!) 21  ?Temp: 97.6 ?F (36.4 ?C)   97.7 ?F (36.5 ?C)  ?TempSrc: Oral   Oral  ?SpO2: 95% 96% 93% 94%  ?Weight:      ?Height:      ? ? ?Intake/Output Summary (Last 24 hours) at 05/11/2021 1552 ?Last data filed at 05/11/2021 1000 ?Gross per 24 hour  ?Intake 240 ml  ?Output --  ?Net 240 ml  ? ?Filed Weights  ? 05/09/21 1051 05/10/21 0321  ?Weight: 106.6 kg 105.6 kg  ? ? ?Examination: ? ?General: No acute distress. ?Cardiovascular: RRR. ?Lungs: satting in high 80's on 2 L ?Abdomen: Soft, nontender, nondistended  ?Neurological: Alert and oriented ?3. Moves all extremities ?4. Cranial nerves II through XII grossly intact. ?Skin: Warm and dry. No rashes or lesions. ?Extremities: No clubbing or cyanosis. No edema.  ? ?Data Reviewed: I have personally reviewed following labs and imaging studies ? ?CBC: ?Recent Labs  ?Lab 05/09/21 ?1100 05/10/21 ?6301 05/11/21 ?6010  ?WBC 6.8 5.0 8.6  ?NEUTROABS 5.4  --   --   ?HGB 13.1 12.8 14.0  ?HCT 40.4 39.5 44.5  ?MCV 95.5 95.2 95.7  ?PLT 224 251 284  ? ? ?Basic  Metabolic Panel: ?Recent Labs  ?Lab 05/09/21 ?1100 05/10/21 ?7408 05/11/21 ?1448  ?NA 139 140 142  ?K 3.2* 4.0 4.2  ?CL 98 98 96*  ?CO2 34* 34* 39*  ?GLUCOSE 160* 152* 105*  ?BUN 27* 27* 31*  ?CREATININE 0.78 0.92 0.94  ?CALCIUM 8.7* 8.6* 9.0  ?MG 2.1  --  2.3  ? ? ?GFR: ?Estimated Creatinine Clearance: 60.7 mL/min (by C-G formula based on SCr of 0.94 mg/dL). ? ?Liver Function Tests: ?Recent Labs  ?Lab 05/10/21 ?1856  ?AST 20  ?ALT 15  ?ALKPHOS 65  ?BILITOT 0.8  ?PROT 7.1  ?ALBUMIN 3.4*  ? ? ?CBG: ?No results for input(s):  GLUCAP in the last 168 hours. ? ? ?Recent Results (from the past 240 hour(s))  ?Resp Panel by RT-PCR (Flu Jillyn Stacey&B, Covid) Nasopharyngeal Swab     Status: None  ? Collection Time: 05/09/21 11:00 AM  ? Specimen: Nasopharyngeal Swab; Nasopharyngeal(NP) swabs in vial transport medium  ?Result Value Ref Range Status  ? SARS Coronavirus 2 by RT PCR NEGATIVE NEGATIVE Final  ?  Comment: (NOTE) ?SARS-CoV-2 target nucleic acids are NOT DETECTED. ? ?The SARS-CoV-2 RNA is generally detectable in upper respiratory ?specimens during the acute phase of infection. The lowest ?concentration of SARS-CoV-2 viral copies this assay can detect is ?138 copies/mL. Carlisa Eble negative result does not preclude SARS-Cov-2 ?infection and should not be used as the sole basis for treatment or ?other patient management decisions. Solomon Skowronek negative result may occur with  ?improper specimen collection/handling, submission of specimen other ?than nasopharyngeal swab, presence of viral mutation(s) within the ?areas targeted by this assay, and inadequate number of viral ?copies(<138 copies/mL). Elford Evilsizer negative result must be combined with ?clinical observations, patient history, and epidemiological ?information. The expected result is Negative. ? ?Fact Sheet for Patients:  ?EntrepreneurPulse.com.au ? ?Fact Sheet for Healthcare Providers:  ?IncredibleEmployment.be ? ?This test is no t yet approved or cleared by the [place] FDA and  ?has been authorized for detection and/or diagnosis of SARS-CoV-2 by ?FDA under an Emergency Use Authorization (EUA). This EUA will remain  ?in effect (meaning this test can be used) for the duration of the ?COVID-19 declaration under Section 564(b)(1) of the Act, 21 ?U.S.C.section 360bbb-3(b)(1), unless the authorization is terminated  ?or revoked sooner.  ? ? ?  ? Influenza Ami Thornsberry by PCR NEGATIVE NEGATIVE Final  ? Influenza B by PCR NEGATIVE NEGATIVE Final  ?  Comment: (NOTE) ?The Xpert Xpress SARS-CoV-2/FLU/RSV  plus assay is intended as an aid ?in the diagnosis of influenza from Nasopharyngeal swab specimens and ?should not be used as Macallister Ashmead sole basis for treatment. Nasal washings and ?aspirates are unacceptable for Xpert Xpress SARS-CoV-2/FLU/RSV ?testing. ? ?Fact Sheet for Patients: ?EntrepreneurPulse.com.au ? ?Fact Sheet for Healthcare Providers: ?IncredibleEmployment.be ? ?This test is not yet approved or cleared by the [place] FDA and ?has been authorized for detection and/or diagnosis of SARS-CoV-2 by ?FDA under an Emergency Use Authorization (EUA). This EUA will remain ?in effect (meaning this test can be used) for the duration of the ?COVID-19 declaration under Section 564(b)(1) of the Act, 21 U.S.C. ?section 360bbb-3(b)(1), unless the authorization is terminated or ?revoked. ? ?Performed at [facility], [address] ?[place], [place] [address] ?  ?Culture, blood (routine x 2)     Status: None (Preliminary result)  ? Collection Time: 05/09/21 11:00 AM  ? Specimen: BLOOD  ?Result Value Ref Range Status  ? Specimen Description   Final  ?  BLOOD BLOOD RIGHT HAND ?Performed  at [facility], [place] [address] [place], [place] [address] ?  ? Special Requests   Final  ?  BOTTLES DRAWN AEROBIC AND ANAEROBIC Blood Culture adequate volume ?Performed at [facility], [place] [address], [place], [place] [address] ?  ? Culture   Final  ?  NO GROWTH 2 DAYS ?Performed at [facility], [place] [address] [place],  [address] ?  ? Report Status PENDING  Incomplete  ?Respiratory (~20 pathogens) panel by PCR     Status: Abnormal  ? Collection Time: 05/09/21 11:00 AM  ? Specimen: Nasopharyngeal Swab; Respiratory  ?Result Value Ref Range Status  ? Adenovirus NOT DETECTED NOT DETECTED Final  ? Coronavirus 229E NOT DETECTED NOT DETECTED Final  ?  Comment: (NOTE) ?The Coronavirus on the Respiratory Panel, DOES NOT test for the novel   ?Coronavirus (2019 nCoV) ?  ? Coronavirus HKU1 NOT DETECTED NOT DETECTED Final  ? Coronavirus NL63 NOT DETECTED NOT DETECTED Final  ? Coronavirus OC43 NOT DETECTED NOT DETECTED Final  ? Metapneumovirus DETECT

## 2021-05-11 NOTE — Assessment & Plan Note (Signed)
Eliquis, metop ?

## 2021-05-11 NOTE — Assessment & Plan Note (Signed)
lipitor

## 2021-05-11 NOTE — Hospital Course (Addendum)
81 year old with past medical history of asthma, diastolic CHF, HLD, A-fib on Eliquis, [name], HTN, anxiety presented to the hospital with ongoing progressive dyspnea on exertion for 5 days.  Started off with URI type symptoms at her senior living facility.Cxr-showed mild bibasilar atelectasis versus edema, resp viral panel positive for metapneumovirus, patient did not have acute on chronic respiratory failure with hypoxia, acute asthma exacerbation and acute on chronic diastolic CHF.  ?Patient has clinically improved on baseline 2 L nasal cannula.  She is mobilizing better, planning for home health PT/OT upon discharge will discharge on oral steroid taper diuretics along with bronchodilators on home regimen.  She will need to follow-up with PCP closely in a week. ?  ?

## 2021-05-12 DIAGNOSIS — E669 Obesity, unspecified: Secondary | ICD-10-CM

## 2021-05-12 DIAGNOSIS — R0602 Shortness of breath: Secondary | ICD-10-CM | POA: Diagnosis not present

## 2021-05-12 LAB — CBC
HCT: 42.6 % (ref 36.0–46.0)
Hemoglobin: 13.6 g/dL (ref 12.0–15.0)
MCH: 30.8 pg (ref 26.0–34.0)
MCHC: 31.9 g/dL (ref 30.0–36.0)
MCV: 96.4 fL (ref 80.0–100.0)
Platelets: 321 10*3/uL (ref 150–400)
RBC: 4.42 MIL/uL (ref 3.87–5.11)
RDW: 13.6 % (ref 11.5–15.5)
WBC: 9.8 10*3/uL (ref 4.0–10.5)
nRBC: 0 % (ref 0.0–0.2)

## 2021-05-12 LAB — GLUCOSE, CAPILLARY: Glucose-Capillary: 160 mg/dL — ABNORMAL HIGH (ref 70–99)

## 2021-05-12 LAB — BASIC METABOLIC PANEL
Anion gap: 7 (ref 5–15)
BUN: 40 mg/dL — ABNORMAL HIGH (ref 8–23)
CO2: 38 mmol/L — ABNORMAL HIGH (ref 22–32)
Calcium: 9.1 mg/dL (ref 8.9–10.3)
Chloride: 95 mmol/L — ABNORMAL LOW (ref 98–111)
Creatinine, Ser: 1.02 mg/dL — ABNORMAL HIGH (ref 0.44–1.00)
GFR, Estimated: 56 mL/min — ABNORMAL LOW (ref >60)
Glucose, Bld: 101 mg/dL — ABNORMAL HIGH (ref 70–99)
Potassium: 4.1 mmol/L (ref 3.5–5.1)
Sodium: 140 mmol/L (ref 135–145)

## 2021-05-12 LAB — MAGNESIUM: Magnesium: 2.3 mg/dL (ref 1.7–2.4)

## 2021-05-12 NOTE — Progress Notes (Signed)
Occupational Therapy Treatment ?Patient Details ?Name: Lindsay [name] ?MRN: [id number] ?DOB: 25-Jan-1940 ?Today's Date: 05/12/2021 ? ? ?History of present illness Patient is a 81 year old female who presented to the hosptial with progressive dyspnea on 05/09/21. Patient was found to have acute on chronic hypoxia, metapneumovirus,and asthma exacerbation. PMH: asthma, CHF, ?  ?OT comments ? Patient semi-supine in bed, wanting to get to chair for breakfast. Patient able to don socks semi-supine in bed without assistance. Overall supervision level to transfer to recliner with walker, no loss of balance noted. O2 88-89% on 3L post minimal activity. Patient reports she is on home O2 24/7. Progressing towards acute OT goals.   ? ?Recommendations for follow up therapy are one component of a multi-disciplinary discharge planning process, led by the attending physician.  Recommendations may be updated based on patient status, additional functional criteria and insurance authorization. ?   ?Follow Up Recommendations ? Home health OT  ?  ?Assistance Recommended at Discharge Intermittent Supervision/Assistance  ?Patient can return home with the following ? A little help with walking and/or transfers;A little help with bathing/dressing/bathroom;Assistance with cooking/housework;Direct supervision/assist for financial management;Help with stairs or ramp for entrance ?  ?Equipment Recommendations ? None recommended by OT  ?  ?   ?Precautions / Restrictions Precautions ?Precautions: Fall ?Precaution Comments: monitor O2 ?Restrictions ?Weight Bearing Restrictions: No  ? ? ?  ? ?Mobility Bed Mobility ?Overal bed mobility: Modified Independent ?  ?  ?  ?  ?  ?  ?  ?  ? ? ?  ?Balance Overall balance assessment: Needs assistance ?Sitting-balance support: Feet supported ?Sitting balance-Leahy Scale: Good ?  ?  ?Standing balance support: Reliant on assistive device for balance ?Standing balance-Leahy Scale: Poor ?  ?  ?  ?  ?  ?  ?  ?  ?  ?  ?  ?   ?   ? ?ADL either performed or assessed with clinical judgement  ? ?ADL Overall ADL's : Needs assistance/impaired ?Eating/Feeding: Independent;Sitting ?  ?  ?  ?  ?  ?  ?  ?  ?  ?Lower Body Dressing: Independent;Bed level ?Lower Body Dressing Details (indicate cue type and reason): Patient able to don socks in long sitting in bed ?Toilet Transfer: Supervision/safety;Stand-pivot;Rolling walker (2 wheels) ?Toilet Transfer Details (indicate cue type and reason): Patient was able to power up to standing and transfer to recliner chair without physical assistance ?  ?  ?  ?  ?Functional mobility during ADLs: Supervision/safety;Rolling walker (2 wheels) ?General ADL Comments: Patient appears closer to her baseline this session, does desat to 88-89% on 3L O2 with activity ?  ? ? ? ?Cognition Arousal/Alertness: Awake/alert ?Behavior During Therapy: [facility] for tasks assessed/performed ?Overall Cognitive Status: Within Functional Limits for tasks assessed ?  ?  ?  ?  ?  ?  ?  ?  ?  ?  ?  ?  ?  ?  ?  ?  ?  ?  ?  ?   ?   ?   ?   ? ? ?Pertinent Vitals/ Pain       Pain Assessment ?Pain Assessment: No/denies pain ? ?   ?   ? ?Frequency ? Min 2X/week  ? ? ? ? ?  ?Progress Toward Goals ? ?OT Goals(current goals can now be found in the care plan section) ? Progress towards OT goals: Progressing toward goals ? ?Acute Rehab OT Goals ?Patient Stated Goal: Home today ?OT Goal Formulation: With patient ?  Time For Goal Achievement: 05/24/21 ?Potential to Achieve Goals: Good ?ADL Goals ?Pt Will Perform Lower Body Dressing: with modified independence;with adaptive equipment;sit to/from stand ?Pt Will Transfer to Toilet: with modified independence;regular height toilet;ambulating ?Pt Will Perform Toileting - Clothing Manipulation and hygiene: with modified independence;sitting/lateral leans;sit to/from stand  ?Plan Discharge plan remains appropriate   ? ?   ?AM-PAC OT "6 Clicks" Daily Activity     ?Outcome Measure ? ? Help from another person  eating meals?: None ?Help from another person taking care of personal grooming?: A Little ?Help from another person toileting, which includes using toliet, bedpan, or urinal?: A Little ?Help from another person bathing (including washing, rinsing, drying)?: A Little ?Help from another person to put on and taking off regular upper body clothing?: A Little ?Help from another person to put on and taking off regular lower body clothing?: A Little ?6 Click Score: 19 ? ?  ?End of Session Equipment Utilized During Treatment: Rolling walker (2 wheels) ? ?OT Visit Diagnosis: Unsteadiness on feet (R26.81);Muscle weakness (generalized) (M62.81) ?  ?Activity Tolerance Patient tolerated treatment well ?  ?Patient Left in chair;with call bell/phone within reach ?  ?Nurse Communication Mobility status ?  ? ?   ? ?Time: 6389-3734 ?OT Time Calculation (min): 9 min ? ?Charges: OT General Charges ?$OT Visit: 1 Visit ?OT Treatments ?$Self Care/Home Management : 8-22 mins ? ?Delbert Phenix OT ?OT pager: [phone] ? ? ?Rosemary Holms ?05/12/2021, 12:10 PM ?

## 2021-05-12 NOTE — Progress Notes (Signed)
?PROGRESS NOTE ?Lindsay [name]  ACZ:[id number] DOB: 02/13/1940 DOA: 05/09/2021 ?PCP: [name], NP  ? ?Brief Narrative/Hospital Course: ?81 year old with past medical history of asthma, diastolic CHF, HLD, A-fib on Eliquis, [name], HTN, anxiety presented to the hospital with ongoing progressive dyspnea on exertion for 5 days.  Started off with URI type symptoms at her senior living facility.Cxr-showed mild bibasilar atelectasis versus edema, resp viral panel positive for metapneumovirus, patient did not have acute on chronic respiratory failure with hypoxia, acute asthma exacerbation and acute on chronic diastolic CHF.  ?   ?  ?Subjective: ?Seen and examined this morning.  Still on 3 to nasal cannula, not back to baseline in terms of her breathing overall improving ?Assessment and Plan: ?Principal Problem: ?  Dyspnea ?Active Problems: ?  Acute on chronic respiratory failure with hypoxia (HCC) ?  Asthma exacerbation ?  Acute on chronic diastolic CHF (congestive heart failure) ([place]) ?  AF (paroxysmal atrial fibrillation) ([place]) ?  Hypokalemia ?  Essential hypertension ?  Hyperglycemia ?  Anxiety ?  HLD (hyperlipidemia) ?  GERD (gastroesophageal reflux disease) ?  Chronic anticoagulation ?  GAD (generalized anxiety disorder) ?  Obesity, Class II, BMI 35-39.9 ?  ?Dyspnea ?Acute on chronic respiratory failure with hypoxia (HCC) ?Acute Asthma exacerbation ?Metapneumovirus infection: ?Baseline on 2 L nasal cannula has been needing 3 L, due to metapneumovirus and acute asthma exacerbation/bronchitis along with acute on chronic CHF.  Continue on supplemental oxygen, bronchodilators, steroids I-S.  PT OT and ambulation. ? ?Acute on chronic diastolic CHF: Continue on Lasix IV, metoprolol.  Chest x-ray showed bilateral edema/effusions.  Echo showed EF 60 to 10%, diastolic function could not be evaluated, monitor intake output Daily weight. ?Net IO Since Admission: -720 mL [05/12/21 1042]  ? ?PAF: Rate  controlled on metoprolol, Eliquis. ?Hypokalemia-stable ?Essential hypertension:stable on metoprolol ?Hyperglycemia-stable ?HLD-atorvastatin ?GERD-on PPI ?GAD: Zoloft, Remeron ?Class II Obesity:Patient's Body mass index is 35.4 kg/m?. : Will benefit with PCP follow-up, weight loss  healthy lifestyle and outpatient sleep evaluation. ? ?DVT prophylaxis: ELIQUIS ?Code Status:   Code Status: Full Code ?Family Communication: plan of care discussed with patien at bedside. ?Patient status is: inpatient level of care: Telemetry  ?Remains inpatient because: Ongoing management of respiratory failure ?Patient currently not stable ? ?Dispo: The patient is from: Home ?           Anticipated disposition: Home likely tomorrow.  [facility] in [facility] community ? ?Mobility Assessment (last 72 hours)   ? ? Mobility Assessment   ? ? [place] Name 05/11/21 2300 05/11/21 0750 05/10/21 1535 05/10/21 1154 05/10/21 0827  ? Does patient have an order for bedrest or is patient medically unstable No - Continue assessment No - Continue assessment -- -- No - Continue assessment  ? What is the highest level of mobility based on the progressive mobility assessment? Level 5 (Walks with assist in room/hall) - Balance while stepping forward/back and can walk in room with assist - Complete Level 5 (Walks with assist in room/hall) - Balance while stepping forward/back and can walk in room with assist - Complete Level 5 (Walks with assist in room/hall) - Balance while stepping forward/back and can walk in room with assist - Complete Level 4 (Walks with assist in room) - Balance while marching in place and cannot step forward and back - Complete Level 5 (Walks with assist in room/hall) - Balance while stepping forward/back and can walk in room with assist - Complete  ? ?  [place] Name 06-Jun-2021 2156  ?  ?  ?  ?  ? Does patient have an order for bedrest or is patient medically unstable No - Continue assessment      ? What is the highest level of mobility based  on the progressive mobility assessment? Level 5 (Walks with assist in room/hall) - Balance while stepping forward/back and can walk in room with assist - Complete      ? ?  ?  ? ?  ?  ? ?Objective: ?Vitals last 24 hrs: ?Vitals:  ? 05/11/21 2048 05/12/21 0540 05/12/21 0751 05/12/21 0753  ?BP: 110/76 (!) 141/104    ?Pulse: 79 78    ?Resp: 18 16    ?Temp: 98.2 ?F (36.8 ?C) 97.6 ?F (36.4 ?C)    ?TempSrc: Oral Oral    ?SpO2: 94% 96% 97% 97%  ?Weight:      ?Height:      ? ?Weight change:  ? ?Physical Examination: ?General exam: AA OX3,older than stated age, weak appearing. ?HEENT:Oral mucosa moist, Ear/Nose WNL grossly, dentition normal. ?Respiratory system: bilaterally diminished BS, no use of accessory muscle ?Cardiovascular system: S1 & S2 +, No JVD,. ?Gastrointestinal system: Abdomen soft,NT,ND, BS+ ?Nervous System:Alert, awake, moving extremities and grossly nonfocal ?Extremities: LE edema non pitting,distal peripheral pulses palpable.  ?Skin: No rashes,no icterus. ?MSK: Normal muscle bulk,tone, power ? ?Medications reviewed:  ?Scheduled Meds: ? apixaban  5 mg Oral BID  ? atorvastatin  20 mg Oral QPM  ? budesonide (PULMICORT) nebulizer solution  0.5 mg Nebulization BID  ? furosemide  40 mg Intravenous Q12H  ? guaiFENesin  600 mg Oral BID  ? ipratropium-albuterol  3 mL Nebulization TID  ? metoprolol succinate  50 mg Oral BID  ? mirtazapine  45 mg Oral QHS  ? pantoprazole  20 mg Oral Daily  ? potassium chloride  40 mEq Oral Daily  ? predniSONE  40 mg Oral Q breakfast  ? sertraline  100 mg Oral QHS  ? ?Continuous Infusions: ? ?  ?Diet Order   ? ?       ?  Diet Carb Modified Fluid consistency: Thin; Room service appropriate? Yes  Diet effective now       ?  ? ?  ?  ? ?  ?  ? ?  ?  ?  ? ? ?Intake/Output Summary (Last 24 hours) at 05/12/2021 1041 ?Last data filed at 05/12/2021 [phone] ?Gross per 24 hour  ?Intake 490 ml  ?Output 1400 ml  ?Net -910 ml  ? ?Net IO Since Admission: -720 mL [05/12/21 1041]  ?Wt Readings from Last 3  Encounters:  ?05/10/21 105.6 kg  ?06-06-21 106.6 kg  ?02/18/21 106.6 kg  ?  ? ?Unresulted Labs (From admission, onward)  ? ?  Start     Ordered  ? 05/11/21 5361  Basic metabolic panel  Daily,   R     ? 05/10/21 0834  ? 05/11/21 0500  Magnesium  Daily,   R     ? 05/10/21 0834  ? 05/11/21 0500  CBC  Daily,   R     ? 05/10/21 0834  ? ?  ?  ? ?  ? ?Data Reviewed: I have personally reviewed following labs and imaging studies ?CBC: ?Recent Labs  ?Lab 06-06-2021 ?1100 05/10/21 ?4431 05/11/21 ?5400 05/12/21 ?8676  ?WBC 6.8 5.0 8.6 9.8  ?NEUTROABS 5.4  --   --   --   ?HGB 13.1 12.8 14.0 13.6  ?HCT 40.4 39.5 44.5 42.6  ?  MCV 95.5 95.2 95.7 96.4  ?PLT 224 251 284 321  ? ?Basic Metabolic Panel: ?Recent Labs  ?Lab 05/09/21 ?1100 05/10/21 ?5366 05/11/21 ?4403 05/12/21 ?4742  ?NA 139 140 142 140  ?K 3.2* 4.0 4.2 4.1  ?CL 98 98 96* 95*  ?CO2 34* 34* 39* 38*  ?GLUCOSE 160* 152* 105* 101*  ?BUN 27* 27* 31* 40*  ?CREATININE 0.78 0.92 0.94 1.02*  ?CALCIUM 8.7* 8.6* 9.0 9.1  ?MG 2.1  --  2.3 2.3  ? ?GFR: ?Estimated Creatinine Clearance: 56 mL/min (A) (by C-G formula based on SCr of 1.02 mg/dL (H)). ?Liver Function Tests: ?Recent Labs  ?Lab 05/10/21 ?5956  ?AST 20  ?ALT 15  ?ALKPHOS 65  ?BILITOT 0.8  ?PROT 7.1  ?ALBUMIN 3.4*  ? ?No results for input(s): LIPASE, AMYLASE in the last 168 hours. ?No results for input(s): AMMONIA in the last 168 hours. ?Coagulation Profile: ?No results for input(s): INR, PROTIME in the last 168 hours. ?BNP (last 3 results) ?No results for input(s): PROBNP in the last 8760 hours. ?HbA1C: ?Recent Labs  ?  05/09/21 ?1921  ?HGBA1C 5.5  ? ?CBG: ?No results for input(s): GLUCAP in the last 168 hours. ?Lipid Profile: ?No results for input(s): CHOL, HDL, LDLCALC, TRIG, CHOLHDL, LDLDIRECT in the last 72 hours. ?Thyroid Function Tests: ?No results for input(s): TSH, T4TOTAL, FREET4, T3FREE, THYROIDAB in the last 72 hours. ?Sepsis Labs: ?Recent Labs  ?Lab 05/09/21 ?1100 05/10/21 ?3875  ?PROCALCITON  --  <0.10   ?LATICACIDVEN 1.8  --   ? ? ?Recent Results (from the past 240 hour(s))  ?Resp Panel by RT-PCR (Flu A&B, Covid) Nasopharyngeal Swab     Status: None  ? Collection Time: 05/09/21 11:00 AM  ? Specimen: Nasopharyng

## 2021-05-12 NOTE — TOC Initial Note (Signed)
Transition of Care (TOC) - Initial/Assessment Note  ? ? ?Patient Details  ?Name: Lindsay [name] ?MRN: [id number] ?Date of Birth: [birth year] ? ?Transition of Care (TOC) CM/SW Contact:    ?Tyreece Gelles, Marjie Skiff, RN ?Phone Number: ?05/12/2021, 2:17 PM ? ?Clinical Narrative:                 ?Spoke with pt at bedside for dc planning. Pt is from [place] [facility]. She has home 02 through [facility]. Pt states that if she needs PT at dc that she would like to use the in house therapy department [facility]. If HHPT ordered TOC will fax orders to [employer]. ? ?Expected Discharge Plan: Home/Self Care ?Barriers to Discharge: Continued Medical Work up ? ? ?Patient Goals and CMS Choice ?Patient states their goals for this hospitalization and ongoing recovery are:: To go home ?  ?  ? ?Expected Discharge Plan and Services ?Expected Discharge Plan: Home/Self Care ?  ?Discharge Planning Services: CM Consult ?  ?Living arrangements for the past 2 months: [place] ?                ?   ? ?Prior Living Arrangements/Services ?Living arrangements for the past 2 months: [place] ?Lives with:: Facility Resident ?Patient language and need for interpreter reviewed:: Yes ?Do you feel safe going back to the place where you live?: Yes      ?Need for Family Participation in Patient Care: Yes (Comment) ?Care giver support system in place?: Yes (comment) ?  ?Criminal Activity/Legal Involvement Pertinent to Current Situation/Hospitalization: No - Comment as needed ? ?Activities of Daily Living ?Home Assistive Devices/Equipment: Gilford Rile (specify type) (front wheel walker) ?ADL Screening (condition at time of admission) ?Patient's cognitive ability adequate to safely complete daily activities?: Yes ?Is the patient deaf or have difficulty hearing?: No ?Does the patient have difficulty seeing, even when wearing glasses/contacts?: No ?Does the patient have difficulty concentrating, remembering, or making  decisions?: No ?Patient able to express need for assistance with ADLs?: Yes ?Does the patient have difficulty dressing or bathing?: No ?Independently performs ADLs?: Yes (appropriate for developmental age) ?Does the patient have difficulty walking or climbing stairs?: Yes ?Weakness of Legs: Both ?Weakness of Arms/Hands: None ?  ? ?Emotional Assessment ?Appearance:: Appears stated age ?Attitude/Demeanor/Rapport: Gracious ?Affect (typically observed): Calm ?Orientation: : Oriented to Self, Oriented to Place, Oriented to  Time, Oriented to Situation ?Alcohol / Substance Use: Not Applicable ?Psych Involvement: No (comment) ? ?Admission diagnosis:  Dyspnea [R06.00] ?COPD exacerbation ([place]) [J44.1] ?Patient Active Problem List  ? Diagnosis Date Noted  ? Obesity, Class II, BMI 35-39.9 05/12/2021  ? Essential hypertension 05/11/2021  ? Anxiety 05/11/2021  ? Dyspnea 05/09/2021  ? GERD (gastroesophageal reflux disease) 05/09/2021  ? HLD (hyperlipidemia) 05/09/2021  ? Hyperglycemia 05/09/2021  ? Bilateral lower extremity edema 03/09/2021  ? Physical deconditioning 02/18/2021  ? Sore throat 11/16/2020  ? COPD (chronic obstructive pulmonary disease) ([place]) 11/11/2020  ? GAD (generalized anxiety disorder) 10/21/2020  ? UTI symptoms 10/21/2020  ? Urinary incontinence in female 09/28/2020  ? Acute on chronic diastolic CHF (congestive heart failure) ([place]) 09/14/2020  ? Persistent atrial fibrillation ([place])   ? Achalasia of esophagus 08/06/2020  ? Acute on chronic respiratory failure with hypoxia ([place]) 08/02/2020  ? Hypokalemia 08/02/2020  ? Asthma exacerbation 08/02/2020  ? AF (paroxysmal atrial fibrillation) ([place]) 08/02/2020  ? Chronic anticoagulation 08/02/2020  ? Acquired thrombophilia ([place]) 08/02/2020  ? Age-related osteoporosis without current pathological fracture 01/15/2020  ? Moderate persistent  asthma without complication 16/55/3748  ? History of compression fracture of spine 12/02/2019  ? Irritable bowel syndrome with both  constipation and diarrhea 12/02/2019  ? Morbid obesity with body mass index (BMI) of 40.0 or higher ([place]) 12/02/2019  ? Oxygen dependent 12/02/2019  ? Recurrent major depressive disorder, in partial remission ([place]) 12/02/2019  ? Vitamin D deficiency 12/02/2019  ? ?PCP:  [name], NP ?Pharmacy:   ?[facility] - [place], [place] - [address] ?[address] ?[place] [address] ?Phone: [phone] Fax: [phone] ? ? ? ? ?Social Determinants of Health (SDOH) Interventions ?  ? ?Readmission Risk Interventions ? ?  05/12/2021  ?  2:16 PM  ?Readmission Risk Prevention Plan  ?Transportation Screening Complete  ?PCP or Specialist Appt within 3-5 Days Complete  ?[place] or Home Care Consult Complete  ?Social Work Consult for [place] Planning/Counseling Complete  ?Palliative Care Screening Not Applicable  ?Medication Review Press photographer) Complete  ? ? ? ?

## 2021-05-12 NOTE — Care Management Important Message (Signed)
Important Message ? ?Patient Details IM Letter placed in Patients room. ?Name: Lindsay [name] ?MRN: [id number] ?Date of Birth: Jun 20, 1940 ? ? ?Medicare Important Message Given:  Yes ? ? ? ? ?Kerin Salen ?05/12/2021, 9:09 AM ?

## 2021-05-13 DIAGNOSIS — R0602 Shortness of breath: Secondary | ICD-10-CM | POA: Diagnosis not present

## 2021-05-13 LAB — CBC
HCT: 43.5 % (ref 36.0–46.0)
Hemoglobin: 13.8 g/dL (ref 12.0–15.0)
MCH: 30.4 pg (ref 26.0–34.0)
MCHC: 31.7 g/dL (ref 30.0–36.0)
MCV: 95.8 fL (ref 80.0–100.0)
Platelets: 297 10*3/uL (ref 150–400)
RBC: 4.54 MIL/uL (ref 3.87–5.11)
RDW: 13.4 % (ref 11.5–15.5)
WBC: 9.4 10*3/uL (ref 4.0–10.5)
nRBC: 0 % (ref 0.0–0.2)

## 2021-05-13 LAB — BASIC METABOLIC PANEL
Anion gap: 9 (ref 5–15)
BUN: 37 mg/dL — ABNORMAL HIGH (ref 8–23)
CO2: 36 mmol/L — ABNORMAL HIGH (ref 22–32)
Calcium: 9.2 mg/dL (ref 8.9–10.3)
Chloride: 96 mmol/L — ABNORMAL LOW (ref 98–111)
Creatinine, Ser: 0.96 mg/dL (ref 0.44–1.00)
GFR, Estimated: 60 mL/min — ABNORMAL LOW (ref >60)
Glucose, Bld: 109 mg/dL — ABNORMAL HIGH (ref 70–99)
Potassium: 4.1 mmol/L (ref 3.5–5.1)
Sodium: 141 mmol/L (ref 135–145)

## 2021-05-13 LAB — MAGNESIUM: Magnesium: 2.3 mg/dL (ref 1.7–2.4)

## 2021-05-13 MED ORDER — PREDNISONE 10 MG PO TABS
ORAL_TABLET | ORAL | 0 refills | Status: DC
Start: 2021-05-13 — End: 2021-09-22

## 2021-05-13 MED ORDER — GUAIFENESIN ER 600 MG PO TB12: 600.0000 mg | ORAL_TABLET | Freq: Two times a day (BID) | ORAL | 0 refills | Status: DC

## 2021-05-13 MED ORDER — POTASSIUM CHLORIDE ER 10 MEQ PO TBCR: 20.0000 meq | EXTENDED_RELEASE_TABLET | Freq: Every day | ORAL | 0 refills | Status: DC

## 2021-05-13 NOTE — TOC Transition Note (Signed)
Transition of Care (TOC) - CM/SW Discharge Note ? ? ?Patient Details  ?Name: Lindsay [name] ?MRN: [id number] ?Date of Birth: 1940-12-24 ? ?Transition of Care (TOC) CM/SW Contact:  ?Juliene Kirsh, Marjie Skiff, RN ?Phone Number: ?05/13/2021, 10:50 AM ? ? ?Clinical Narrative:    ?HHPT/OT orders faxed to [facility] [phone]. [facility] will provide therapy services at [facility]. ? ? ?Final next level of care: Home/Self Care ?Barriers to Discharge: Continued Medical Work up ? ? ?Patient Goals and CMS Choice ?Patient states their goals for this hospitalization and ongoing recovery are:: To go home ?  ? Discharge Plan and Services ?  ?Discharge Planning Services: CM Consult ?           ?  ? ?Readmission Risk Interventions ? ?  05/12/2021  ?  2:16 PM  ?Readmission Risk Prevention Plan  ?Transportation Screening Complete  ?PCP or Specialist Appt within 3-5 Days Complete  ?[place] or Home Care Consult Complete  ?Social Work Consult for [place] Planning/Counseling Complete  ?Palliative Care Screening Not Applicable  ?Medication Review Press photographer) Complete  ? ? ? ? ? ?

## 2021-05-13 NOTE — Discharge Summary (Signed)
Physician Discharge Summary  ?Lindsay [name] NTZ:[id number] DOB: [birth year] DOA: 05/09/2021 ? ?PCP: [name], NP ? ?Admit date: 05/09/2021 ?Discharge date: 05/13/2021 ?Recommendations for Outpatient Follow-up:  ?Follow up with PCP in 1 weeks-call for appointment ?Please obtain BMP/CBC in one week ? ?Discharge Dispo: [place] ?Discharge Condition: Stable ?Code Status:   Code Status: Full Code ?Diet recommendation:  ?Diet Order   ? ?       ?  Diet Carb Modified Fluid consistency: Thin; Room service appropriate? Yes  Diet effective now       ?  ? ?  ?  ? ?  ?  ?Brief/Interim Summary: ?81 year old with past medical history of asthma, diastolic CHF, HLD, A-fib on Eliquis, [name], HTN, anxiety presented to the hospital with ongoing progressive dyspnea on exertion for 5 days.  Started off with URI type symptoms at her senior living facility.Cxr-showed mild bibasilar atelectasis versus edema, resp viral panel positive for metapneumovirus, patient did not have acute on chronic respiratory failure with hypoxia, acute asthma exacerbation and acute on chronic diastolic CHF.  ?Patient has clinically improved on baseline 2 L nasal cannula.  She is mobilizing better, planning for home health PT/OT upon discharge will discharge on oral steroid taper diuretics along with bronchodilators on home regimen.  She will need to follow-up with PCP closely in a week. ?   ? ?Discharge Diagnoses:  ?Principal Problem: ?  Dyspnea ?Active Problems: ?  Acute on chronic respiratory failure with hypoxia (HCC) ?  Asthma exacerbation ?  Acute on chronic diastolic CHF (congestive heart failure) ([place]) ?  AF (paroxysmal atrial fibrillation) ([place]) ?  Hypokalemia ?  Essential hypertension ?  Hyperglycemia ?  Anxiety ?  HLD (hyperlipidemia) ?  GERD (gastroesophageal reflux disease) ?  Chronic anticoagulation ?  GAD (generalized anxiety disorder) ?  Obesity, Class II, BMI 35-39.9 ? ?Dyspnea ?Acute on chronic respiratory failure with  hypoxia (HCC) ?Acute Asthma exacerbation ?Metapneumovirus infection: ?due to metapneumovirus and acute asthma exacerbation/bronchitis along with acute on chronic CHF.  Down to 2 L nasal cannula ambulated and maintain her saturation [place] appeared weak.  She will continue on oral steroid taper bronchodilators and her diuretic regimen and home health PT ?9Acute on chronic diastolic CHF: Managed with Lasix IV, metoprolol.  Chest x-ray showed bilateral edema/effusions.  Echo showed EF 60 to 67%, diastolic function could not be evaluated. ?Net IO Since Admission: -2,780 mL [05/13/21 1126] she will resume her home torsemide upon discharge.   ? ?PAF: Rate controlled on metoprolol, Eliquis. ?Hypokalemia-stable ?Essential hypertension:stable on metoprolol ?Hyperglycemia-stable ?HLD-atorvastatin ?GERD-on PPI ?GAD: Zoloft, Remeron ?Deconditioning/debility resume PT OT at home. ?Class II Obesity:Patient's Body mass index is 35.4 kg/m?. : Will benefit with PCP follow-up, weight loss  healthy lifestyle and outpatient sleep evaluation ? ?Consults: ?None ?Subjective: ?Alert awake no shortness of breath mild wheezing, maintain saturation with ambulation although was weak so referred to home health ? ?Discharge Exam: ?Vitals:  ? 05/13/21 0542 05/13/21 0859  ?BP: 100/70   ?Pulse: 77   ?Resp: 17   ?Temp: 97.7 ?F (36.5 ?C)   ?SpO2: 98% 93%  ? ?General: Pt is alert, awake, not in acute distress ?Cardiovascular: RRR, S1/S2 +, no rubs, no gallops ?Respiratory: CTA bilaterally, no wheezing, no rhonchi ?Abdominal: Soft, NT, ND, bowel sounds + ?Extremities: no edema, no cyanosis ? ?Discharge Instructions ? ?Discharge Instructions   ? ? Discharge instructions   Complete by: As directed ?  ? Follow-up with PCP check CBC  BMP in 1 week ? ?Please call call MD or return to ER for similar or worsening recurring problem that brought you to hospital or if any fever,nausea/vomiting,abdominal pain, uncontrolled pain, chest pain,  shortness of breath or  any other alarming symptoms. ? ?Please follow-up your doctor as instructed in a week time and call the office for appointment. ? ?Please avoid alcohol, smoking, or any other illicit substance and maintain healthy habits including taking your regular medications as prescribed. ? ?You were cared for by a hospitalist during your hospital stay. If you have any questions about your discharge medications or the care you received while you were in the hospital after you are discharged, you can call the unit and ask to speak with the hospitalist on call if the hospitalist that took care of you is not available. ? ?Once you are discharged, your primary care physician will handle any further medical issues. Please note that NO REFILLS for any discharge medications will be authorized once you are discharged, as it is imperative that you return to your primary care physician (or establish a relationship with a primary care physician if you do not have one) for your aftercare needs so that they can reassess your need for medications and monitor your lab values  ? Increase activity slowly   Complete by: As directed ?  ? ?  ? ?Allergies as of 05/13/2021   ? ?   Reactions  ? Acetaminophen-codeine Other (See Comments)  ? Agitation  ? Codeine Other (See Comments)  ? hallucinations  ? ?  ? ?  ?Medication List  ?  ? ?TAKE these medications   ? ?albuterol (5 MG/ML) 0.5% nebulizer solution ?Commonly known as: PROVENTIL ?Take 0.5 mLs by nebulization every 6 (six) hours as needed for wheezing or shortness of breath. ?  ?albuterol 108 (90 Base) MCG/ACT inhaler ?Commonly known as: VENTOLIN HFA ?Inhale 1-2 puffs into the lungs every 6 (six) hours as needed for wheezing or shortness of breath. ?  ?alendronate 70 MG tablet ?Commonly known as: FOSAMAX ?Take 1 tablet (70 mg total) by mouth every Saturday. ?  ?apixaban 5 MG Tabs tablet ?Commonly known as: ELIQUIS ?Take 1 tablet (5 mg total) by mouth 2 (two) times daily. ?  ?atorvastatin 20 MG  tablet ?Commonly known as: LIPITOR ?Take 1 tablet (20 mg total) by mouth every evening. ?  ?benzonatate 200 MG capsule ?Commonly known as: TESSALON ?Take 1 capsule (200 mg total) by mouth 3 (three) times daily as needed for cough. ?  ?CALCIUM 1200+D3 PO ?Take 1 tablet by mouth in the morning. ?  ?cetirizine 10 MG tablet ?Commonly known as: ZyrTEC Allergy ?Take 1 tablet (10 mg total) by mouth daily. ?  ?Cough Drops 5.8 MG Lozg ?Generic drug: Menthol ?Use as directed 1 lozenge (5.8 mg total) in the mouth or throat as needed (for dry, sore, or scratchy throat). ?  ?dextromethorphan-guaiFENesin 30-600 MG 12hr tablet ?Commonly known as: [place]akes DM ?Take 2 tablets by mouth 2 (two) times daily. ?  ?guaiFENesin 600 MG 12 hr tablet ?Commonly known as: Fabens ?Take 1 tablet (600 mg total) by mouth 2 (two) times daily for 7 days. ?  ?ipratropium-albuterol 0.5-2.5 (3) MG/3ML Soln ?Commonly known as: DUONEB ?Inhale 3 mLs into the lungs every 6 (six) hours as needed (wheezing). ?  ?LORazepam 1 MG tablet ?Commonly known as: ATIVAN ?TAKE 1 TABLET BY MOUTH 2 TIMES DAILY AS NEEDED ?What changed: See the new instructions. ?  ?Medical Compression Stockings Misc ?Two  pair knee high 30-53mHg compression stockings. Sized XL. As covered by insurance for ICD-10: I50.33, R60.0. ?  ?metoprolol succinate 50 MG 24 hr tablet ?Commonly known as: TOPROL-XL ?Take 1 tablet (50 mg total) by mouth 2 (two) times daily. ?  ?mirtazapine 45 MG tablet ?Commonly known as: REMERON ?Take 1 tablet (45 mg total) by mouth at bedtime. ?  ?multivitamin with minerals Tabs tablet ?Take 1 tablet by mouth in the morning. ?  ?OXYGEN ?Inhale 2 L into the lungs daily as needed (shortness of breath or activity level). ?  ?pantoprazole 20 MG tablet ?Commonly known as: Protonix ?Take 1 tablet (20 mg total) by mouth daily. ?  ?Phenazopyridine HCl 99.5 MG Tabs ?Take 2 tablets by mouth 3 (three) times daily as needed (urinary pain). ?  ?potassium chloride 10 MEQ  tablet ?Commonly known as: KLOR-CON ?Take 2 tablets (20 mEq total) by mouth daily. ?  ?predniSONE 10 MG tablet ?Commonly known as: DELTASONE ?Take PO 4 tabs daily x 2 days,3 tabs daily x 2 days,2 tabs daily x 2 days,1 tab

## 2021-05-14 LAB — CULTURE, BLOOD (ROUTINE X 2)
Culture: NO GROWTH
Culture: NO GROWTH
Special Requests: ADEQUATE

## 2021-05-19 ENCOUNTER — Ambulatory Visit (HOSPITAL_BASED_OUTPATIENT_CLINIC_OR_DEPARTMENT_OTHER): Payer: Medicare HMO | Admitting: Nurse Practitioner

## 2021-05-26 ENCOUNTER — Other Ambulatory Visit (HOSPITAL_BASED_OUTPATIENT_CLINIC_OR_DEPARTMENT_OTHER): Payer: Self-pay | Admitting: Nurse Practitioner

## 2021-05-26 DIAGNOSIS — I5033 Acute on chronic diastolic (congestive) heart failure: Secondary | ICD-10-CM

## 2021-05-27 ENCOUNTER — Other Ambulatory Visit (HOSPITAL_BASED_OUTPATIENT_CLINIC_OR_DEPARTMENT_OTHER): Payer: Self-pay | Admitting: Nurse Practitioner

## 2021-05-27 DIAGNOSIS — F411 Generalized anxiety disorder: Secondary | ICD-10-CM

## 2021-05-27 DIAGNOSIS — F3341 Major depressive disorder, recurrent, in partial remission: Secondary | ICD-10-CM

## 2021-06-07 ENCOUNTER — Other Ambulatory Visit (HOSPITAL_BASED_OUTPATIENT_CLINIC_OR_DEPARTMENT_OTHER): Payer: Self-pay | Admitting: Nurse Practitioner

## 2021-06-07 DIAGNOSIS — F411 Generalized anxiety disorder: Secondary | ICD-10-CM

## 2021-06-15 ENCOUNTER — Other Ambulatory Visit (HOSPITAL_BASED_OUTPATIENT_CLINIC_OR_DEPARTMENT_OTHER): Payer: Self-pay | Admitting: Nurse Practitioner

## 2021-07-11 ENCOUNTER — Inpatient Hospital Stay: Payer: Medicare HMO

## 2021-07-11 ENCOUNTER — Inpatient Hospital Stay: Payer: Medicare HMO | Attending: Oncology | Admitting: Oncology

## 2021-07-11 ENCOUNTER — Other Ambulatory Visit: Payer: Self-pay

## 2021-07-11 ENCOUNTER — Ambulatory Visit: Payer: Medicare HMO

## 2021-07-11 ENCOUNTER — Ambulatory Visit
Admission: RE | Admit: 2021-07-11 | Discharge: 2021-07-11 | Disposition: A | Discharge status: Home / Self Care | Payer: Medicare HMO | Source: Ambulatory Visit | Attending: Neurology | Admitting: Neurology

## 2021-07-11 VITALS — BP 114/80 | HR 73 | Temp 98.1°F | Resp 18 | Ht 68.0 in | Wt 237.0 lb

## 2021-07-11 DIAGNOSIS — D472 Monoclonal gammopathy: Secondary | ICD-10-CM | POA: Insufficient documentation

## 2021-07-11 DIAGNOSIS — I4891 Unspecified atrial fibrillation: Secondary | ICD-10-CM | POA: Diagnosis not present

## 2021-07-11 DIAGNOSIS — J9610 Chronic respiratory failure, unspecified whether with hypoxia or hypercapnia: Secondary | ICD-10-CM | POA: Diagnosis not present

## 2021-07-11 DIAGNOSIS — R200 Anesthesia of skin: Secondary | ICD-10-CM | POA: Diagnosis not present

## 2021-07-11 DIAGNOSIS — Z7901 Long term (current) use of anticoagulants: Secondary | ICD-10-CM | POA: Insufficient documentation

## 2021-07-11 DIAGNOSIS — I509 Heart failure, unspecified: Secondary | ICD-10-CM | POA: Diagnosis not present

## 2021-07-11 LAB — BASIC METABOLIC PANEL - CANCER CENTER ONLY
Anion gap: 10 (ref 5–15)
BUN: 21 mg/dL (ref 8–23)
CO2: 36 mmol/L — ABNORMAL HIGH (ref 22–32)
Calcium: 10.3 mg/dL (ref 8.9–10.3)
Chloride: 99 mmol/L (ref 98–111)
Creatinine: 0.98 mg/dL (ref 0.44–1.00)
GFR, Estimated: 58 mL/min — ABNORMAL LOW (ref >60)
Glucose, Bld: 108 mg/dL — ABNORMAL HIGH (ref 70–99)
Potassium: 4 mmol/L (ref 3.5–5.1)
Sodium: 145 mmol/L (ref 135–145)

## 2021-07-11 LAB — CBC WITH DIFFERENTIAL (CANCER CENTER ONLY)
Abs Immature Granulocytes: 0.02 10*3/uL (ref 0.00–0.07)
Basophils Absolute: 0.1 10*3/uL (ref 0.0–0.1)
Basophils Relative: 1 %
Eosinophils Absolute: 0.2 10*3/uL (ref 0.0–0.5)
Eosinophils Relative: 2 %
HCT: 40 % (ref 36.0–46.0)
Hemoglobin: 12.8 g/dL (ref 12.0–15.0)
Immature Granulocytes: 0 %
Lymphocytes Relative: 13 %
Lymphs Abs: 1.1 10*3/uL (ref 0.7–4.0)
MCH: 29.7 pg (ref 26.0–34.0)
MCHC: 32 g/dL (ref 30.0–36.0)
MCV: 92.8 fL (ref 80.0–100.0)
Monocytes Absolute: 0.6 10*3/uL (ref 0.1–1.0)
Monocytes Relative: 7 %
Neutro Abs: 6.5 10*3/uL (ref 1.7–7.7)
Neutrophils Relative %: 77 %
Platelet Count: 273 10*3/uL (ref 150–400)
RBC: 4.31 MIL/uL (ref 3.87–5.11)
RDW: 14 % (ref 11.5–15.5)
WBC Count: 8.5 10*3/uL (ref 4.0–10.5)
nRBC: 0 % (ref 0.0–0.2)

## 2021-07-11 NOTE — Progress Notes (Signed)
  [facility] OFFICE PROGRESS NOTE   Diagnosis: Serum monoclonal protein  INTERVAL HISTORY:   Lindsay [name] returns as scheduled.  She is here with her daughter.  No pain or fever.  She reports having "pneumonia "last month.  Good appetite.  She has leg edema.  She has intermittent "floaters "in the visual fields.  Objective:  Vital signs in last 24 hours:  Blood pressure 100/67, pulse 73, temperature 98.1 F (36.7 C), temperature source Oral, resp. rate 18, height $RemoveBe'5\' 8"'hIUwmgVKP$  (1.727 m), weight 237 lb (107.5 kg), SpO2 95 %.    Lymphatics: No cervical, supraclavicular, axillary, or inguinal nodes Resp: Distant breath sounds with scattered end inspiratory rhonchi, no respiratory distress Cardio: Irregular GI: No hepatosplenomegaly, nontender Vascular: Trace left greater than right ankle edema    Lab Results:  Lab Results  Component Value Date   WBC 8.5 07/11/2021   HGB 12.8 07/11/2021   HCT 40.0 07/11/2021   MCV 92.8 07/11/2021   PLT 273 07/11/2021   NEUTROABS 6.5 07/11/2021    CMP  Lab Results  Component Value Date   NA 141 05/13/2021   K 4.1 05/13/2021   CL 96 (L) 05/13/2021   CO2 36 (H) 05/13/2021   GLUCOSE 109 (H) 05/13/2021   BUN 37 (H) 05/13/2021   CREATININE 0.96 05/13/2021   CALCIUM 9.2 05/13/2021   PROT 7.1 05/10/2021   ALBUMIN 3.4 (L) 05/10/2021   AST 20 05/10/2021   ALT 15 05/10/2021   ALKPHOS 65 05/10/2021   BILITOT 0.8 05/10/2021   GFRNONAA 60 (L) 05/13/2021    Medications: I have reviewed the patient's current medications.   Assessment/Plan: Monoclonal gammopathy of undetermined significance 12/23/2020 0.3 g/dL M spike with IFE showing IgG monoclonal protein with kappa light chain specificity Mild elevation of kappa light chains 01/10/2021 12/23/2020 quantitative immunoglobulins IgG, IgA and IgM all within normal range Atrial fibrillation on Eliquis Chronic respiratory failure CHF    Disposition: Lindsay [name] appears stable.  There is  no clinical or laboratory evidence for progression to multiple myeloma or another lymphoproliferative disorder.  We will follow-up on the serum protein electrophoresis and light chains from today.  She appears to have a monoclonal gammopathy of unknown significance.    She will continue follow-up with her primary provider for management of respiratory failure and atrial fibrillation.  She will discuss the eye floaters with her primary provider.  Lindsay [name] will return for an office and lab visit in 9 months.  Betsy Coder, MD  07/11/2021  2:41 PM

## 2021-07-12 ENCOUNTER — Encounter: Payer: Self-pay | Admitting: *Deleted

## 2021-07-12 NOTE — Progress Notes (Signed)
Call to lab to add on light chains to 6/19 lab draw. Do not have correct sample tube to process this lab. MD notified. OK to wait till next draw in March.

## 2021-07-13 LAB — PROTEIN ELECTROPHORESIS, SERUM
A/G Ratio: 1 (ref 0.7–1.7)
Albumin ELP: 3.5 g/dL (ref 2.9–4.4)
Alpha-1-Globulin: 0.3 g/dL (ref 0.0–0.4)
Alpha-2-Globulin: 0.8 g/dL (ref 0.4–1.0)
Beta Globulin: 1.1 g/dL (ref 0.7–1.3)
Gamma Globulin: 1.1 g/dL (ref 0.4–1.8)
Globulin, Total: 3.4 g/dL (ref 2.2–3.9)
M-Spike, %: 0.4 g/dL — ABNORMAL HIGH
Total Protein ELP: 6.9 g/dL (ref 6.0–8.5)

## 2021-07-25 ENCOUNTER — Other Ambulatory Visit: Payer: Self-pay

## 2021-07-25 DIAGNOSIS — I480 Paroxysmal atrial fibrillation: Secondary | ICD-10-CM

## 2021-07-25 MED ORDER — APIXABAN 5 MG PO TABS: 5.0000 mg | ORAL_TABLET | Freq: Two times a day (BID) | ORAL | 0 refills | Status: DC

## 2021-07-25 NOTE — Telephone Encounter (Signed)
Eliquis '5mg'$  refill request received. Patient is 81 years old, weight-107.5kg, Crea-0.98 on 07/11/2021, Diagnosis-Afib, and last seen by Laurann Montana on 08/24/2020-PT WILL NEED AN APPT. Dose is appropriate based on dosing criteria.

## 2021-07-29 ENCOUNTER — Other Ambulatory Visit: Payer: Self-pay | Admitting: *Deleted

## 2021-07-29 ENCOUNTER — Encounter (HOSPITAL_BASED_OUTPATIENT_CLINIC_OR_DEPARTMENT_OTHER): Payer: Self-pay

## 2021-07-29 DIAGNOSIS — I480 Paroxysmal atrial fibrillation: Secondary | ICD-10-CM

## 2021-07-29 MED ORDER — APIXABAN 5 MG PO TABS: 5.0000 mg | ORAL_TABLET | Freq: Two times a day (BID) | ORAL | 0 refills | Status: DC

## 2021-07-29 NOTE — Telephone Encounter (Signed)
Will forward to Pharm D for review  

## 2021-07-29 NOTE — Telephone Encounter (Signed)
Prescription refill request for Eliquis received.  Indication: afib  Last office visit: Lindsay [name], 08/24/2020 Scr: 0.98, 07/11/2021 Age: 81 yo  Weight: 107.5 kg  Refill sent.

## 2021-08-17 ENCOUNTER — Other Ambulatory Visit: Payer: Self-pay | Admitting: Family

## 2021-08-17 DIAGNOSIS — I480 Paroxysmal atrial fibrillation: Secondary | ICD-10-CM

## 2021-08-17 NOTE — Telephone Encounter (Signed)
Please review for refill. Thank you! 

## 2021-08-17 NOTE — Telephone Encounter (Signed)
Prescription refill request for Eliquis received. Indication: Afib  Last office visit: 08/24/20 Gilford Rile)  Scr: 0.98 (07/11/21) Age: 81 Weight: 107.5kg  Appropriate dose and refill sent to requested pharmacy.

## 2021-08-26 ENCOUNTER — Encounter: Payer: Self-pay | Admitting: Pulmonary Disease

## 2021-08-29 ENCOUNTER — Telehealth (HOSPITAL_BASED_OUTPATIENT_CLINIC_OR_DEPARTMENT_OTHER): Payer: Self-pay

## 2021-08-29 NOTE — Telephone Encounter (Signed)
Received RX today from [facility] for Torsemide '20mg'$  originally prescribed by another doctor. Placed in [name]'s basket for her to approve.

## 2021-09-01 ENCOUNTER — Other Ambulatory Visit (HOSPITAL_BASED_OUTPATIENT_CLINIC_OR_DEPARTMENT_OTHER): Payer: Self-pay | Admitting: Nurse Practitioner

## 2021-09-01 DIAGNOSIS — F411 Generalized anxiety disorder: Secondary | ICD-10-CM

## 2021-09-01 DIAGNOSIS — I5033 Acute on chronic diastolic (congestive) heart failure: Secondary | ICD-10-CM

## 2021-09-01 MED ORDER — TORSEMIDE 20 MG PO TABS: ORAL_TABLET | ORAL | 5 refills | Status: DC

## 2021-09-22 ENCOUNTER — Encounter: Payer: Self-pay | Admitting: Pulmonary Disease

## 2021-09-22 ENCOUNTER — Ambulatory Visit (INDEPENDENT_AMBULATORY_CARE_PROVIDER_SITE_OTHER): Payer: Medicare HMO | Admitting: Pulmonary Disease

## 2021-09-22 VITALS — BP 106/80 | HR 77 | Temp 98.2°F | Ht 69.0 in | Wt 240.6 lb

## 2021-09-22 DIAGNOSIS — J4541 Moderate persistent asthma with (acute) exacerbation: Secondary | ICD-10-CM

## 2021-09-22 DIAGNOSIS — R0602 Shortness of breath: Secondary | ICD-10-CM | POA: Diagnosis not present

## 2021-09-22 LAB — POCT INFLUENZA A/B
Influenza A, POC: NEGATIVE
Influenza B, POC: NEGATIVE

## 2021-09-22 LAB — POC COVID19 BINAXNOW: SARS Coronavirus 2 Ag: NEGATIVE

## 2021-09-22 MED ORDER — AZITHROMYCIN 250 MG PO TABS: ORAL_TABLET | ORAL | 0 refills | Status: DC

## 2021-09-22 MED ORDER — PREDNISONE 10 MG PO TABS: ORAL_TABLET | ORAL | 0 refills | Status: AC

## 2021-09-22 NOTE — Progress Notes (Signed)
Synopsis: Referred in January 2022 for chronic respiratory failure  Subjective:   PATIENT ID: Lindsay [name] DOB: July 30, 1940, MRN: [id number]  HPI  Chief Complaint  Patient presents with   Follow-up    Felt poorly over the last few days. Dizziness, lethargic, BP low re facility nurse. Coughing, wheezing & SOB increased.   Lindsay [name] is a 81 year old woman, never smoker with chronic respiratory failure on 2L of supplemental oxygen who returns to pulmonary clinic for follow up.  She was admitted to the hospital after last visit, 4/17 to 4/21 heart failure exacerbation. She was metapneumovirus postive on extended respiratory viral panel testing leading to acute on chronic resp failure and acute on chronic heart failure.  She reports feeling bad over the past 4-5 days with chest congestion, cough, fatigue, nausea and lower than normal blood pressure. She reports 2 covid cases at her [facility] center but denies direct exposure or contact. She denies fevers or chills. She has increased wheezing.   She is requesting a handicap placard.  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 There was also concern for asthma in which she was started on trelegy ellipta.   She reports increasing dyspnea and wheezing over the past 3 days. She has noted O2 saturations in the 70s when walking at home on 2L O2. She has not been able to walk as far recently and required a wheelchair to enter clinic today. Her SpO2 was confirmed in clinic to drop in the 70s with ambulation this morning. She has been using duoneb treatments every 6 hours and taking extra lasix doses without improvement in her breathing. She also complains of painful urination and inreased urinary frequency over the past 2 days concerning for UTI. She reports falling at home and scraping her left elbow. She has been feeling lightheaded.   OV 02/17/2020 She reports she started having  issues with her breathing 2 years ago after she suffered compound fractures in her back that led to an increase in the curvature of her thoracic spine. She was started on supplemental oxygen shortly after these events occurred. Per her chart she has exertional oxygen desaturations to 87% from her physical therapy notes. She also reports cough and wheezing intermittently. She denies sputum production. She is a never smoker but lived with a smoker for 20 years. She denies history of pneumonias.   She has been started on trelegy ellipta and she felt this helped her for a period of time but doesn't notice lasting relief from it anymore.   She has atrial fibrillation. She does have leg swelling and is taking torsemide.   She has received 2 covid vaccines. She received her pneumonia vaccine 2 weeks ago and she had the influenza vaccine this past fall.     She lived in [place] before but moved to [place] as she had frequent falls and lived on her own. Her daughter lives here in town.   Past Medical History:  Diagnosis Date   Acute diastolic CHF (congestive heart failure) ([facility]) 08/03/2020   Acute non-recurrent pansinusitis 10/21/2020   Acute on chronic respiratory failure with hypoxia ([facility]) 08/02/2020   Asthma    Atrial fibrillation ([facility])    Dysphagia 08/02/2020   Elevated troponin 09/14/2020   High cholesterol    HTN (hypertension)    Hyperkalemia 09/14/2020   Hyponatremia 08/02/2020   Low grade squamous intraepithelial lesion on cytologic smear of cervix (LGSIL) 12/02/2019   Respiratory  failure with hypoxia ([facility])      Family History  Problem Relation Age of Onset   Breast cancer Sister    Hypertension Other      Social History   Socioeconomic History   Marital status: Divorced    Spouse name: Not on file   Number of children: Not on file   Years of education: Not on file   Highest education level: Not on file  Occupational History   Not on file  Tobacco Use   Smoking status:  Former    Types: Cigarettes   Smokeless tobacco: Never   Tobacco comments:    Quit remotely 1970s  Vaping Use   Vaping Use: Never used  Substance and Sexual Activity   Alcohol use: Not Currently   Drug use: Never   Sexual activity: Not Currently  Other Topics Concern   Not on file  Social History Narrative   Not on file   Social Determinants of Health   Financial Resource Strain: Not on file  Food Insecurity: Not on file  Transportation Needs: Not on file  Physical Activity: Not on file  Stress: Not on file  Social Connections: Not on file  Intimate Partner Violence: Not on file     Allergies  Allergen Reactions   Acetaminophen-Codeine Other (See Comments)    Agitation   Codeine Other (See Comments)    hallucinations     Outpatient Medications Prior to Visit  Medication Sig Dispense Refill   albuterol (PROVENTIL) (5 MG/ML) 0.5% nebulizer solution Take 0.5 mLs by nebulization every 6 (six) hours as needed for wheezing or shortness of breath.     albuterol (VENTOLIN HFA) 108 (90 Base) MCG/ACT inhaler Inhale 1-2 puffs into the lungs every 6 (six) hours as needed for wheezing or shortness of breath. 8 g 6   alendronate (FOSAMAX) 70 MG tablet Take 1 tablet (70 mg total) by mouth every Saturday. 12 tablet 3   apixaban (ELIQUIS) 5 MG TABS tablet TAKE 1 TABLET BY MOUTH 2 TIMES DAILY 60 tablet 5   atorvastatin (LIPITOR) 20 MG tablet TAKE 1 TABLET BY MOUTH EVERY EVENING 90 tablet 1   benzonatate (TESSALON) 200 MG capsule Take 1 capsule (200 mg total) by mouth 3 (three) times daily as needed for cough. 30 capsule 0   Calcium-Magnesium-Vitamin D (CALCIUM 1200+D3 PO) Take 1 tablet by mouth in the morning.     cetirizine (ZYRTEC ALLERGY) 10 MG tablet Take 1 tablet (10 mg total) by mouth daily. 30 tablet 11   Elastic Bandages & Supports (MEDICAL COMPRESSION STOCKINGS) MISC Two pair knee high 30-3mHg compression stockings. Sized XL. As covered by insurance for ICD-10: I50.33, R60.0. 2  each 1   Fluticasone-Umeclidin-Vilant (TRELEGY ELLIPTA IN) Inhale 1 puff into the lungs daily.     guaiFENesin (MUCUS RELIEF) 600 MG 12 hr tablet Take 1 tablet (600 mg total) by mouth 2 (two) times daily as needed. 28 tablet 3   ipratropium-albuterol (DUONEB) 0.5-2.5 (3) MG/3ML SOLN Inhale 3 mLs into the lungs every 6 (six) hours as needed (wheezing). 1080 each 3   LORazepam (ATIVAN) 1 MG tablet TAKE 1 TABLET BY MOUTH TWICE DAILY AS NEEDED FOR ANXIETY 60 tablet 2   Menthol (COUGH DROPS) 5.8 MG LOZG Use as directed 1 lozenge (5.8 mg total) in the mouth or throat as needed (for dry, sore, or scratchy throat). 36 lozenge 11   metoprolol succinate (TOPROL-XL) 50 MG 24 hr tablet Take 1 tablet (50 mg total) by mouth 2 (  two) times daily. 180 tablet 3   mirtazapine (REMERON) 45 MG tablet Take 1 tablet (45 mg total) by mouth at bedtime. 90 tablet 1   MUCUS RELIEF 600 MG 12 hr tablet Take 600 mg by mouth 2 (two) times daily.     Multiple Vitamin (MULTIVITAMIN WITH MINERALS) TABS tablet Take 1 tablet by mouth in the morning.     OXYGEN Inhale 2 L into the lungs daily as needed (shortness of breath or activity level).     pantoprazole (PROTONIX) 20 MG tablet TAKE 1 TABLET BY MOUTH EVERY DAY 90 tablet 3   Phenazopyridine HCl 99.5 MG TABS Take 2 tablets by mouth 3 (three) times daily as needed (urinary pain).     sertraline (ZOLOFT) 100 MG tablet TAKE 1 TABLET BY MOUTH AT BEDTIME 90 tablet 1   torsemide (DEMADEX) 20 MG tablet TAKE 2 TABLETS DAILY EVERY MORNING. TAKE AN ADDITIONAL 2 TABLETS AS NEEDED FOR WEIGHT GAIN OF 2 POUNDS OVERNIGHT OR 5 POUNDS IN ONE WEEK. 225 tablet 5   predniSONE (DELTASONE) 10 MG tablet Take PO 4 tabs daily x 2 days,3 tabs daily x 2 days,2 tabs daily x 2 days,1 tab daily x 2 days then stop. 20 tablet 0   potassium chloride (KLOR-CON) 10 MEQ tablet Take 2 tablets (20 mEq total) by mouth daily. 30 tablet 0   No facility-administered medications prior to visit.   Review of Systems   Constitutional:  Negative for chills, fever, malaise/fatigue and weight loss.  HENT:  Positive for congestion. Negative for sinus pain and sore throat.   Eyes: Negative.   Respiratory:  Positive for cough, sputum production, shortness of breath and wheezing. Negative for hemoptysis.   Cardiovascular:  Negative for chest pain, palpitations, orthopnea, claudication and leg swelling.  Gastrointestinal:  Negative for abdominal pain, heartburn, nausea and vomiting.  Genitourinary: Negative.   Musculoskeletal:  Negative for back pain, falls, joint pain and myalgias.  Skin:  Negative for rash.  Neurological:  Negative for dizziness and weakness.  Endo/Heme/Allergies: Negative.   Psychiatric/Behavioral: Negative.      Objective:   Vitals:   09/22/21 1041  BP: 106/80  Pulse: 77  Temp: 98.2 F (36.8 C)  TempSrc: Oral  SpO2: 95%  Weight: 240 lb 9.6 oz (109.1 kg)  Height: '5\' 9"'$  (1.753 m)   Physical Exam Constitutional:      General: She is not in acute distress. HENT:     Head: Normocephalic and atraumatic.  Eyes:     General: No scleral icterus.    Conjunctiva/sclera: Conjunctivae normal.     Pupils: Pupils are equal, round, and reactive to light.  Cardiovascular:     Rate and Rhythm: Normal rate and regular rhythm.     Pulses: Normal pulses.     Heart sounds: Normal heart sounds. No murmur heard. Pulmonary:     Breath sounds: Decreased air movement present. Decreased breath sounds and wheezing (scattered) present. No rhonchi or rales.  Abdominal:     General: Bowel sounds are normal.     Palpations: Abdomen is soft.  Musculoskeletal:     Right lower leg: No edema.     Left lower leg: No edema.     Comments: Scoliosis noted  Lymphadenopathy:     Cervical: No cervical adenopathy.  Skin:    General: Skin is warm and dry.  Neurological:     General: No focal deficit present.     Mental Status: She is alert.  Psychiatric:  Mood and Affect: Mood normal.         Behavior: Behavior normal.        Thought Content: Thought content normal.        Judgment: Judgment normal.    CBC    Component Value Date/Time   WBC 8.5 07/11/2021 1409   WBC 9.4 05/13/2021 0539   RBC 4.31 07/11/2021 1409   HGB 12.8 07/11/2021 1409   HGB 13.0 10/21/2020 1104   HCT 40.0 07/11/2021 1409   HCT 39.5 10/21/2020 1104   PLT 273 07/11/2021 1409   PLT 320 10/21/2020 1104   MCV 92.8 07/11/2021 1409   MCV 94 10/21/2020 1104   MCH 29.7 07/11/2021 1409   MCHC 32.0 07/11/2021 1409   RDW 14.0 07/11/2021 1409   RDW 12.8 10/21/2020 1104   LYMPHSABS 1.1 07/11/2021 1409   LYMPHSABS 1.2 10/21/2020 1104   MONOABS 0.6 07/11/2021 1409   EOSABS 0.2 07/11/2021 1409   EOSABS 0.1 10/21/2020 1104   BASOSABS 0.1 07/11/2021 1409   BASOSABS 0.1 10/21/2020 1104      Latest Ref Rng & Units 07/11/2021    2:09 PM 05/13/2021    5:39 AM 05/12/2021    5:34 AM  BMP  Glucose 70 - 99 mg/dL 108  109  101   BUN 8 - 23 mg/dL 21  37  40   Creatinine 0.44 - 1.00 mg/dL 0.98  0.96  1.02   Sodium 135 - 145 mmol/L 145  141  140   Potassium 3.5 - 5.1 mmol/L 4.0  4.1  4.1   Chloride 98 - 111 mmol/L 99  96  95   CO2 22 - 32 mmol/L 36  36  38   Calcium 8.9 - 10.3 mg/dL 10.3  9.2  9.1    Chest imaging: CXR 12/16/19 The lungs are under aerated with bronchovascular crowding and interstitial prominence. Increased AP diameter on the lateral view. No focal airspace opacities.  Minimal blunting of both costophrenic angles.  Mild enlargement of the cardiac silhouette.  Prominent central vessels. The thoracic aorta is markedly tortuous. Moderate scattered bony degenerative changes. Increased kyphosis with decreased height of numerous thoracic vertebral bodies. Levoscoliosis of the upper thoracic spine. Diffuse decreased bone density.  PFT:     No data to display          Labs: Reviewed from [facility] [place] at [place] 1.11 on 12/02/19 Normal CBC  Assessment & Plan:   SOB (shortness of breath) -  Plan: POCT Influenza A/B, POC COVID-19, Ambulatory Referral for DME  Moderate persistent asthma with exacerbation - Plan: predniSONE (DELTASONE) 10 MG tablet, azithromycin (ZITHROMAX) 250 MG tablet  Discussion: Angeleigh Chiasson is a 81 year old woman, never smoker with chronic respiratory failure on 2L of supplemental oxygen who returns to pulmonary clinic for asthma.  She appears to be having an acute asthma exacerbation at this time. We will treat her with extended steroid taper and azithromycin. Rapid covid and flu tests are negative.   She is to continue trelegy ellipta 1 puff daily and as needed albuterol inhaler/nebs.  Follow up in 6 months.  Freda Jackson, MD [facility] Office: [phone]    Current Outpatient Medications:    albuterol (PROVENTIL) (5 MG/ML) 0.5% nebulizer solution, Take 0.5 mLs by nebulization every 6 (six) hours as needed for wheezing or shortness of breath., Disp: , Rfl:    albuterol (VENTOLIN HFA) 108 (90 Base) MCG/ACT inhaler, Inhale 1-2 puffs into the lungs every 6 (  six) hours as needed for wheezing or shortness of breath., Disp: 8 g, Rfl: 6   alendronate (FOSAMAX) 70 MG tablet, Take 1 tablet (70 mg total) by mouth every Saturday., Disp: 12 tablet, Rfl: 3   apixaban (ELIQUIS) 5 MG TABS tablet, TAKE 1 TABLET BY MOUTH 2 TIMES DAILY, Disp: 60 tablet, Rfl: 5   atorvastatin (LIPITOR) 20 MG tablet, TAKE 1 TABLET BY MOUTH EVERY EVENING, Disp: 90 tablet, Rfl: 1   azithromycin (ZITHROMAX) 250 MG tablet, Take as directed, Disp: 6 tablet, Rfl: 0   benzonatate (TESSALON) 200 MG capsule, Take 1 capsule (200 mg total) by mouth 3 (three) times daily as needed for cough., Disp: 30 capsule, Rfl: 0   Calcium-Magnesium-Vitamin D (CALCIUM 1200+D3 PO), Take 1 tablet by mouth in the morning., Disp: , Rfl:    cetirizine (ZYRTEC ALLERGY) 10 MG tablet, Take 1 tablet (10 mg total) by mouth daily., Disp: 30 tablet, Rfl: 11   Elastic Bandages & Supports (MEDICAL  COMPRESSION STOCKINGS) MISC, Two pair knee high 30-57mHg compression stockings. Sized XL. As covered by insurance for ICD-10: I50.33, R60.0., Disp: 2 each, Rfl: 1   Fluticasone-Umeclidin-Vilant (TRELEGY ELLIPTA IN), Inhale 1 puff into the lungs daily., Disp: , Rfl:    guaiFENesin (MUCUS RELIEF) 600 MG 12 hr tablet, Take 1 tablet (600 mg total) by mouth 2 (two) times daily as needed., Disp: 28 tablet, Rfl: 3   ipratropium-albuterol (DUONEB) 0.5-2.5 (3) MG/3ML SOLN, Inhale 3 mLs into the lungs every 6 (six) hours as needed (wheezing)., Disp: 1080 each, Rfl: 3   LORazepam (ATIVAN) 1 MG tablet, TAKE 1 TABLET BY MOUTH TWICE DAILY AS NEEDED FOR ANXIETY, Disp: 60 tablet, Rfl: 2   Menthol (COUGH DROPS) 5.8 MG LOZG, Use as directed 1 lozenge (5.8 mg total) in the mouth or throat as needed (for dry, sore, or scratchy throat)., Disp: 36 lozenge, Rfl: 11   metoprolol succinate (TOPROL-XL) 50 MG 24 hr tablet, Take 1 tablet (50 mg total) by mouth 2 (two) times daily., Disp: 180 tablet, Rfl: 3   mirtazapine (REMERON) 45 MG tablet, Take 1 tablet (45 mg total) by mouth at bedtime., Disp: 90 tablet, Rfl: 1   MUCUS RELIEF 600 MG 12 hr tablet, Take 600 mg by mouth 2 (two) times daily., Disp: , Rfl:    Multiple Vitamin (MULTIVITAMIN WITH MINERALS) TABS tablet, Take 1 tablet by mouth in the morning., Disp: , Rfl:    OXYGEN, Inhale 2 L into the lungs daily as needed (shortness of breath or activity level)., Disp: , Rfl:    pantoprazole (PROTONIX) 20 MG tablet, TAKE 1 TABLET BY MOUTH EVERY DAY, Disp: 90 tablet, Rfl: 3   Phenazopyridine HCl 99.5 MG TABS, Take 2 tablets by mouth 3 (three) times daily as needed (urinary pain)., Disp: , Rfl:    predniSONE (DELTASONE) 10 MG tablet, Take 4 tablets (40 mg total) by mouth daily with breakfast for 3 days, THEN 3 tablets (30 mg total) daily with breakfast for 3 days, THEN 2 tablets (20 mg total) daily with breakfast for 3 days, THEN 1 tablet (10 mg total) daily with breakfast for 3  days., Disp: 30 tablet, Rfl: 0   sertraline (ZOLOFT) 100 MG tablet, TAKE 1 TABLET BY MOUTH AT BEDTIME, Disp: 90 tablet, Rfl: 1   torsemide (DEMADEX) 20 MG tablet, TAKE 2 TABLETS DAILY EVERY MORNING. TAKE AN ADDITIONAL 2 TABLETS AS NEEDED FOR WEIGHT GAIN OF 2 POUNDS OVERNIGHT OR 5 POUNDS IN ONE WEEK., Disp: 225 tablet, Rfl: 5  potassium chloride (KLOR-CON) 10 MEQ tablet, Take 2 tablets (20 mEq total) by mouth daily., Disp: 30 tablet, Rfl: 0

## 2021-09-22 NOTE — Patient Instructions (Addendum)
We will complete handicap placard today  We will order you a portable oxygen concentrator  We will check a rapid covid/flu test today  If results are negative we will treat with steroid taper and azithromycin for asthma exacerbation  Follow up in 6 months

## 2021-10-03 ENCOUNTER — Encounter: Payer: Self-pay | Admitting: Pulmonary Disease

## 2021-10-25 ENCOUNTER — Telehealth (HOSPITAL_BASED_OUTPATIENT_CLINIC_OR_DEPARTMENT_OTHER): Payer: Self-pay

## 2021-10-25 NOTE — Telephone Encounter (Signed)
Rec'd form from [employer] at [place] work was signed by provider and faxed back to [phone].

## 2021-10-27 ENCOUNTER — Other Ambulatory Visit (HOSPITAL_BASED_OUTPATIENT_CLINIC_OR_DEPARTMENT_OTHER): Payer: Self-pay | Admitting: Nurse Practitioner

## 2021-10-27 DIAGNOSIS — F3341 Major depressive disorder, recurrent, in partial remission: Secondary | ICD-10-CM

## 2021-11-14 ENCOUNTER — Telehealth (HOSPITAL_BASED_OUTPATIENT_CLINIC_OR_DEPARTMENT_OTHER): Payer: Self-pay

## 2021-11-14 ENCOUNTER — Encounter: Payer: Self-pay | Admitting: *Deleted

## 2021-11-14 ENCOUNTER — Other Ambulatory Visit (HOSPITAL_BASED_OUTPATIENT_CLINIC_OR_DEPARTMENT_OTHER): Payer: Self-pay | Admitting: Nurse Practitioner

## 2021-11-14 DIAGNOSIS — F411 Generalized anxiety disorder: Secondary | ICD-10-CM

## 2021-11-14 NOTE — Telephone Encounter (Signed)
Patient needs refill on Ativan '1mg'$ .

## 2021-11-21 MED ORDER — LORAZEPAM 1 MG PO TABS: 1.0000 mg | ORAL_TABLET | Freq: Two times a day (BID) | ORAL | 2 refills | Status: DC | PRN

## 2021-11-21 NOTE — Telephone Encounter (Signed)
Refill sent.

## 2021-12-07 ENCOUNTER — Ambulatory Visit (INDEPENDENT_AMBULATORY_CARE_PROVIDER_SITE_OTHER): Payer: Medicare HMO | Admitting: Nurse Practitioner

## 2021-12-07 ENCOUNTER — Encounter: Payer: Self-pay | Admitting: Nurse Practitioner

## 2021-12-07 ENCOUNTER — Ambulatory Visit (INDEPENDENT_AMBULATORY_CARE_PROVIDER_SITE_OTHER): Payer: Medicare HMO

## 2021-12-07 VITALS — BP 104/64 | HR 83 | Ht 69.0 in | Wt 241.0 lb

## 2021-12-07 DIAGNOSIS — R0602 Shortness of breath: Secondary | ICD-10-CM

## 2021-12-07 DIAGNOSIS — J9611 Chronic respiratory failure with hypoxia: Secondary | ICD-10-CM | POA: Diagnosis not present

## 2021-12-07 DIAGNOSIS — J4541 Moderate persistent asthma with (acute) exacerbation: Secondary | ICD-10-CM

## 2021-12-07 DIAGNOSIS — I5032 Chronic diastolic (congestive) heart failure: Secondary | ICD-10-CM

## 2021-12-07 LAB — POC COVID19 BINAXNOW: SARS Coronavirus 2 Ag: NEGATIVE

## 2021-12-07 MED ORDER — AZITHROMYCIN 250 MG PO TABS: ORAL_TABLET | ORAL | 0 refills | Status: AC

## 2021-12-07 MED ORDER — GUAIFENESIN ER 600 MG PO TB12: 600.0000 mg | ORAL_TABLET | Freq: Two times a day (BID) | ORAL | 0 refills | Status: DC

## 2021-12-07 MED ORDER — PREDNISONE 10 MG PO TABS: ORAL_TABLET | ORAL | 0 refills | Status: AC

## 2021-12-07 NOTE — Assessment & Plan Note (Signed)
Dependent BLE edema, non-pitting, on exam today. She is currently maintained on torsemide. May consider brief increase of her torsemide depending on CXR findings.

## 2021-12-07 NOTE — Assessment & Plan Note (Signed)
She had decreased O2 levels noted yesterday; question false readings with acrylic nails? She is clinically improved today per her report. She was 96% on 2 lpm supplemental oxygen today. Goal >88-90%.

## 2021-12-07 NOTE — Patient Instructions (Addendum)
Continue Albuterol inhaler 2 puffs or 3 mL neb every 6 hours as needed for shortness of breath or wheezing. Notify if symptoms persist despite rescue inhaler/neb use. Use neb at least twice daily until symptoms improve Continue Trelegy 1 puff daily. Brush tongue and rinse mouth afterwards Continue Eliquis 1 tab Twice daily as directed by cardiology. Monitor for any excessive bruising or bleeding Continue cetirizine 1 tab daily  Continue supplemental oxygen 2 lpm for goal >88-90%  Continue protonix 1 tab daily  Continue torsemide 2 tabs every morning. Monitor weights - notify your cardiologist of 2-3 lb weight gain overnight or 5 lb in a week   Prednisone taper. 4 tabs for 2 days, then 3 tabs for 2 days, 2 tabs for 2 days, then 1 tab for 2 days, then stop. Take in AM with food.  Azithromycin 2 tabs on day one then 1 tab daily for four additional days. Take with food  Restart guaifenesin 1 tab Twice daily for congestion  Chest x ray today - we will call you with results   Follow up in 2 weeks with Dr. Erin Fulling or Alanson Aly. If symptoms do not improve or worsen, please contact office for sooner follow up or seek emergency care.

## 2021-12-07 NOTE — Progress Notes (Signed)
Please notify patient that chest x ray shows some evidence of volume overload. Possibly contributing to her symptoms. Have her take an additional dose of torsemide for the next five days. Recheck BMET at f/u. Thanks.

## 2021-12-07 NOTE — Assessment & Plan Note (Addendum)
Acute asthma exacerbation likely related to URI and possibly volume overload. CXR to rule out superimposed infection. COVID negative today. We will treat her with prednisone taper and empiric azithromycin. Encouraged her to use breathing treatments at least twice daily. She will continue on triple therapy regimen. Strict ED/return precautions.   Patient Instructions  Continue Albuterol inhaler 2 puffs or 3 mL neb every 6 hours as needed for shortness of breath or wheezing. Notify if symptoms persist despite rescue inhaler/neb use. Use neb at least twice daily until symptoms improve Continue Trelegy 1 puff daily. Brush tongue and rinse mouth afterwards Continue Eliquis 1 tab Twice daily as directed by cardiology. Monitor for any excessive bruising or bleeding Continue cetirizine 1 tab daily  Continue supplemental oxygen 2 lpm for goal >88-90%  Continue protonix 1 tab daily  Continue torsemide 2 tabs every morning. Monitor weights - notify your cardiologist of 2-3 lb weight gain overnight or 5 lb in a week   Prednisone taper. 4 tabs for 2 days, then 3 tabs for 2 days, 2 tabs for 2 days, then 1 tab for 2 days, then stop. Take in AM with food.  Azithromycin 2 tabs on day one then 1 tab daily for four additional days. Take with food  Restart guaifenesin 1 tab Twice daily for congestion  Chest x ray today - we will call you with results   Follow up in 2 weeks with Dr. Erin Fulling or Alanson Aly. If symptoms do not improve or worsen, please contact office for sooner follow up or seek emergency care.

## 2021-12-07 NOTE — Progress Notes (Signed)
$'@Patient'H$  ID: Maida Sale, female    DOB: 1940/12/03, 81 y.o.   MRN: [id number]  Chief Complaint  Patient presents with   Follow-up    Pt acute f/u she has had some chest congestion for 3 days. Pt reports that her oximeter at home was registering in the 70's after switching to 4L she finally got up to 88% after a while. 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 She is a patient of Dr. August Albino and last seen in office 09/22/2021. Past medical history significant for PAF on Eliquis, CHF, HTN, GERD, obesity, MDD, GAD, HLD.   TEST/EVENTS:  02/05/2021 CT chest w/o contrast: enlarged heart size. No LAD. Lungs are clear. Mild persistent bibasilar pleural thickening. RLL atelectasis. Chronic compression fx of thoracic spine with kyphosis.   09/22/2021: OV with Dr. Erin Fulling.  Admitted after her last visit in April from 4/17-4/21 for CHF exacerbation as well as metapneumovirus.  Feeling bad over the last 4 to 5 days with chest congestion, cough, fatigue, nausea and lower than normal blood pressure.  Did have 2 COVID cases at her [facility] center but denies direct exposure contact.  Treated for acute asthma exacerbation with extended steroid taper and azithromycin.  COVID and flu testing was negative.  Continued on triple therapy with Trelegy and as needed albuterol.  12/07/2021: Today - acute Patient presents today for acute visit. She has had increased chest congestion over the past 3 days. She's also been having some more shortness of breath and increased productive cough with white to yellow sputum production. Yesterday, her oxygen was noted to be in the 70's; she increased herself to 4lpm and they were able to get her up to 88%. She does also have some nasal congestion and sinus pressure. She feels  like her right ear is clogged and she's having trouble hearing out of it. No pain or drainage. She does feel like her legs are a little more swollen than normal. Denies any fevers, chills, night sweats, hemoptysis, orthopnea, PND, palpitations. No known sick exposures but she does live in an independent living facility with community dining. She is on Trelegy inhaler. Using torsemide 2 tabs every morning.   Allergies  Allergen Reactions   Acetaminophen-Codeine Other (See Comments)    Agitation   Codeine Other (See Comments)    hallucinations    Immunization History  Administered Date(s) Administered   DTaP 08/06/1992   Moderna SARS-COV2 Booster Vaccination 02/22/2020   Moderna Sars-Covid-2 Vaccination 02/14/2019, 03/14/2019   Pneumococcal Conjugate-13 02/05/2020   Pneumococcal Polysaccharide-23 02/18/2021   Td 10/21/2020   Zoster Recombinat (Shingrix) 02/23/2012    Past Medical History:  Diagnosis Date   Acute diastolic CHF (congestive heart failure) ([place]) 08/03/2020   Acute non-recurrent pansinusitis 10/21/2020   Acute on chronic respiratory failure with hypoxia ([place]) 08/02/2020   Asthma    Atrial fibrillation (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     albuterol (VENTOLIN HFA)  108 (90 Base) MCG/ACT inhaler Inhale 1-2 puffs into the lungs every 6 (six) hours as needed for wheezing or shortness of breath. 8 g 6   alendronate (FOSAMAX) 70 MG tablet Take 1 tablet (70 mg total) by mouth every Saturday. 12 tablet 3   apixaban (ELIQUIS) 5 MG TABS tablet TAKE 1 TABLET BY MOUTH 2 TIMES DAILY 60 tablet 5   atorvastatin (LIPITOR) 20 MG tablet TAKE 1 TABLET BY MOUTH EVERY EVENING 90 tablet 1   azithromycin (ZITHROMAX) 250 MG tablet Take as directed 6 tablet 0   benzonatate (TESSALON) 200 MG capsule Take 1 capsule (200 mg total) by mouth 3 (three) times daily as needed for cough. 30 capsule 0   Calcium-Magnesium-Vitamin D (CALCIUM 1200+D3 PO) Take 1 tablet by mouth in the morning.     cetirizine (ZYRTEC ALLERGY) 10 MG tablet Take 1 tablet (10 mg total) by mouth daily. 30 tablet 11   Elastic Bandages & Supports (MEDICAL COMPRESSION STOCKINGS) MISC Two pair knee high 30-100mHg compression stockings. Sized XL. As covered by insurance for ICD-10: I50.33, R60.0. 2 each 1   Fluticasone-Umeclidin-Vilant (TRELEGY ELLIPTA IN) Inhale 1 puff into the lungs daily.     ipratropium-albuterol (DUONEB) 0.5-2.5 (3) MG/3ML SOLN Inhale 3 mLs into the lungs every 6 (six) hours as needed (wheezing). 1080 each 3   LORazepam (ATIVAN) 1 MG tablet Take 1 tablet (1 mg total) by mouth 2 (two) times daily as needed for anxiety. 60 tablet 2   Menthol (COUGH DROPS) 5.8 MG LOZG Use as directed 1 lozenge (5.8 mg total) in the mouth or throat as needed (for dry, sore, or scratchy throat). 36 lozenge 11   metoprolol succinate (TOPROL-XL) 50 MG 24 hr tablet Take 1 tablet (50 mg total) by mouth 2 (two) times daily. 180 tablet 3   mirtazapine (REMERON) 45 MG tablet TAKE 1 TABLET BY MOUTH AT BEDTIME 90 tablet 3   Multiple Vitamin (MULTIVITAMIN WITH MINERALS) TABS tablet Take 1 tablet by mouth in the morning.     OXYGEN Inhale 2 L into the lungs daily as needed (shortness of breath or activity level).     pantoprazole  (PROTONIX) 20 MG tablet TAKE 1 TABLET BY MOUTH EVERY DAY 90 tablet 3   Phenazopyridine HCl 99.5 MG TABS Take 2 tablets by mouth 3 (three) times daily as needed (urinary pain).     sertraline (ZOLOFT) 100 MG tablet TAKE 1 TABLET BY MOUTH AT BEDTIME 90 tablet 1   torsemide (DEMADEX) 20 MG tablet TAKE 2 TABLETS DAILY EVERY MORNING. TAKE AN ADDITIONAL 2 TABLETS AS NEEDED FOR WEIGHT GAIN OF 2 POUNDS OVERNIGHT OR 5 POUNDS IN ONE WEEK. 225 tablet 5   guaiFENesin (MUCUS RELIEF) 600 MG 12 hr tablet Take 1 tablet (600 mg total) by mouth 2 (two) times daily as needed. 28 tablet 3   MUCUS RELIEF 600 MG 12 hr tablet Take 600 mg by mouth 2 (two) times daily.     potassium chloride (KLOR-CON) 10 MEQ tablet Take 2 tablets (20 mEq total) by mouth daily. 30 tablet 0   No facility-administered medications prior to visit.     Review of  Systems:   Constitutional: No weight loss or gain, night sweats, fevers, chills, fatigue, or lassitude. HEENT: No headaches, difficulty swallowing, tooth/dental problems, or sore throat. No sneezing, itching, ear ache. +ear pressure, decreased hearing right ear, nasal congestion, post nasal drip CV:  +swelling in lower extremities. No chest pain, orthopnea, PND, anasarca, dizziness, palpitations, syncope Resp: +shortness of breath with exertion; productive cough; chest congestion.  No hemoptysis. No wheezing.  No chest wall deformity GI:  No heartburn, indigestion, abdominal pain, nausea, vomiting, diarrhea, change in bowel habits, loss of appetite, bloody stools.  GU: No dysuria, change in color of urine, urgency or frequency.   Skin: No rash, lesions, ulcerations MSK:  No joint pain or swelling.   Neuro: No dizziness or lightheadedness.  Psych: No depression or anxiety. Mood stable.     Physical Exam:  BP 104/64   Pulse 83   Ht '5\' 9"'$  (1.753 m)   Wt 241 lb (109.3 kg)   SpO2 96% Comment: 2L  BMI 35.59 kg/m   GEN: Pleasant, interactive, chronically-ill appearing; in no  acute distress HEENT:  Normocephalic and atraumatic. PERRLA. Sclera white. Nasal turbinates erythematous, moist and patent bilaterally. Clear rhinorrhea present. Oropharynx erythematous and moist, without exudate or edema. No lesions, ulcerations NECK:  Supple w/ fair ROM. No JVD present. Normal carotid impulses w/o bruits. Thyroid symmetrical with no goiter or nodules palpated. No lymphadenopathy.   CV: Irregular rhythm, rate controlled, no m/r/g, dependent bilateral edema. Pulses intact, +2 bilaterally. No cyanosis, pallor or clubbing. PULMONARY:  Unlabored, regular breathing. Scattered rhonchi bilaterally A&P. No accessory muscle use.  GI: BS present and normoactive. Soft, non-tender to palpation. No organomegaly or masses detected.  MSK: No erythema, warmth or tenderness. Cap refil <2 sec all extrem. No deformities or joint swelling noted.  Neuro: A/Ox3. No focal deficits noted.   Skin: Warm, no lesions or rashe Psych: Normal affect and behavior. Judgement and thought content appropriate.     Lab Results:  CBC    Component Value Date/Time   WBC 8.5 07/11/2021 1409   WBC 9.4 05/13/2021 0539   RBC 4.31 07/11/2021 1409   HGB 12.8 07/11/2021 1409   HGB 13.0 10/21/2020 1104   HCT 40.0 07/11/2021 1409   HCT 39.5 10/21/2020 1104   PLT 273 07/11/2021 1409   PLT 320 10/21/2020 1104   MCV 92.8 07/11/2021 1409   MCV 94 10/21/2020 1104   MCH 29.7 07/11/2021 1409   MCHC 32.0 07/11/2021 1409   RDW 14.0 07/11/2021 1409   RDW 12.8 10/21/2020 1104   LYMPHSABS 1.1 07/11/2021 1409   LYMPHSABS 1.2 10/21/2020 1104   MONOABS 0.6 07/11/2021 1409   EOSABS 0.2 07/11/2021 1409   EOSABS 0.1 10/21/2020 1104   BASOSABS 0.1 07/11/2021 1409   BASOSABS 0.1 10/21/2020 1104    BMET    Component Value Date/Time   NA 145 07/11/2021 1409   NA 141 10/21/2020 1104   K 4.0 07/11/2021 1409   CL 99 07/11/2021 1409   CO2 36 (H) 07/11/2021 1409   GLUCOSE 108 (H) 07/11/2021 1409   BUN 21 07/11/2021 1409    BUN 16 10/21/2020 1104   CREATININE 0.98 07/11/2021 1409   CALCIUM 10.3 07/11/2021 1409   GFRNONAA 58 (L) 07/11/2021 1409    BNP    Component Value Date/Time   BNP 166.7 (H) 05/09/2021 1100     Imaging:  DG Chest 2 View  Result Date: 12/07/2021 CLINICAL DATA:  Short of breath, productive cough EXAM: CHEST -  2 VIEW COMPARISON:  05/09/2021 FINDINGS: Frontal and lateral views of the chest demonstrate an enlarged cardiac silhouette. There is ectasia of the thoracic aorta. There is increased central vascular congestion without overt edema. No effusion or pneumothorax. The lungs remain hyperinflated. Multiple thoracic compression deformities are again seen, with exaggerated thoracic kyphosis. IMPRESSION: 1. Central vascular congestion without overt edema. Electronically Signed   By: Randa Ngo M.D.   On: 12/07/2021 16:04          No data to display          No results found for: "NITRICOXIDE"      Assessment & Plan:   Asthma exacerbation Acute asthma exacerbation likely related to URI and possibly volume overload. CXR to rule out superimposed infection. COVID negative today. We will treat her with prednisone taper and empiric azithromycin. Encouraged her to use breathing treatments at least twice daily. She will continue on triple therapy regimen. Strict ED/return precautions.   Patient Instructions  Continue Albuterol inhaler 2 puffs or 3 mL neb every 6 hours as needed for shortness of breath or wheezing. Notify if symptoms persist despite rescue inhaler/neb use. Use neb at least twice daily until symptoms improve Continue Trelegy 1 puff daily. Brush tongue and rinse mouth afterwards Continue Eliquis 1 tab Twice daily as directed by cardiology. Monitor for any excessive bruising or bleeding Continue cetirizine 1 tab daily  Continue supplemental oxygen 2 lpm for goal >88-90%  Continue protonix 1 tab daily  Continue torsemide 2 tabs every morning. Monitor weights - notify  your cardiologist of 2-3 lb weight gain overnight or 5 lb in a week   Prednisone taper. 4 tabs for 2 days, then 3 tabs for 2 days, 2 tabs for 2 days, then 1 tab for 2 days, then stop. Take in AM with food.  Azithromycin 2 tabs on day one then 1 tab daily for four additional days. Take with food  Restart guaifenesin 1 tab Twice daily for congestion  Chest x ray today - we will call you with results   Follow up in 2 weeks with Dr. Erin Fulling or Alanson Aly. If symptoms do not improve or worsen, please contact office for sooner follow up or seek emergency care.    Chronic respiratory failure with hypoxia ([place]) She had decreased O2 levels noted yesterday; question false readings with acrylic nails? She is clinically improved today per her report. She was 96% on 2 lpm supplemental oxygen today. Goal >88-90%.   Chronic CHF (congestive heart failure) ([facility]) Dependent BLE edema, non-pitting, on exam today. She is currently maintained on torsemide. May consider brief increase of her torsemide depending on CXR findings.    I spent 38 minutes of dedicated to the care of this patient on the date of this encounter to include pre-visit review of records, face-to-face time with the patient discussing conditions above, post visit ordering of testing, clinical documentation with the electronic health record, making appropriate referrals as documented, and communicating necessary findings to members of the patients care team.  Clayton Bibles, NP 12/07/2021  Pt aware and understands NP's role.

## 2021-12-20 ENCOUNTER — Ambulatory Visit: Payer: Medicare HMO | Admitting: Pulmonary Disease

## 2021-12-20 ENCOUNTER — Telehealth (HOSPITAL_BASED_OUTPATIENT_CLINIC_OR_DEPARTMENT_OTHER): Payer: Self-pay | Admitting: Family

## 2021-12-20 ENCOUNTER — Encounter: Payer: Self-pay | Admitting: Pulmonary Disease

## 2021-12-20 VITALS — BP 116/74 | HR 82 | Ht 69.0 in | Wt 241.0 lb

## 2021-12-20 DIAGNOSIS — J9611 Chronic respiratory failure with hypoxia: Secondary | ICD-10-CM | POA: Diagnosis not present

## 2021-12-20 DIAGNOSIS — J0100 Acute maxillary sinusitis, unspecified: Secondary | ICD-10-CM

## 2021-12-20 DIAGNOSIS — H9191 Unspecified hearing loss, right ear: Secondary | ICD-10-CM | POA: Diagnosis not present

## 2021-12-20 DIAGNOSIS — J4540 Moderate persistent asthma, uncomplicated: Secondary | ICD-10-CM | POA: Diagnosis not present

## 2021-12-20 MED ORDER — PREDNISONE 10 MG PO TABS: ORAL_TABLET | ORAL | 0 refills | Status: AC

## 2021-12-20 MED ORDER — TRELEGY ELLIPTA 100-62.5-25 MCG/ACT IN AEPB: 1.0000 | INHALATION_SPRAY | Freq: Every day | RESPIRATORY_TRACT | 3 refills | Status: DC

## 2021-12-20 MED ORDER — AMOXICILLIN-POT CLAVULANATE 875-125 MG PO TABS: 1.0000 | ORAL_TABLET | Freq: Two times a day (BID) | ORAL | 0 refills | Status: DC

## 2021-12-20 NOTE — Progress Notes (Addendum)
Synopsis: Referred in January 2022 for chronic respiratory failure  Subjective:   PATIENT ID: Lindsay [name] Client GENDER: female DOB: [birth year], MRN: [id number]  HPI  Chief Complaint  Patient presents with   Follow-up    2wk f/u. States she is not feeling any better. Still has some chest congestion that she can not cough up.    [name] is a 81 year old woman, never smoker with chronic respiratory failure on 2L of supplemental oxygen who returns to pulmonary clinic for follow up.  She was treated for asthma exacerbation 12/07/21 by Rhunette Croft, NP likely secondary to viral infeciton. She was treated with prednisone taper and azithromycin. She is not feeling any better. She continues to have chest congestion and is having trouble coughing it out. She is using zyrtec and mucinex. She continues on trelegy ellipta 1 puff daily. She also complains of decreased hearing of her right ear and sinus pressure/headaches.   OV 09/22/21 She was admitted to the hospital after last visit, 4/17 to 4/21 heart failure exacerbation. She was metapneumovirus postive on extended respiratory viral panel testing leading to acute on chronic resp failure and acute on chronic heart failure.  She reports feeling bad over the past 4-5 days with chest congestion, cough, fatigue, nausea and lower than normal blood pressure. She reports 2 covid cases at her [facility] center but denies direct exposure or contact. She denies fevers or chills. She has increased wheezing.   She is requesting a handicap placard.  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 There was also concern for asthma in which she was started on trelegy ellipta.   She reports increasing dyspnea and wheezing over the past 3 days. She has noted O2 saturations in the 70s when walking at home on 2L O2. She has not been able to walk as far recently and required a wheelchair to enter clinic today. Her SpO2 was  confirmed in clinic to drop in the 70s with ambulation this morning. She has been using duoneb treatments every 6 hours and taking extra lasix doses without improvement in her breathing. She also complains of painful urination and inreased urinary frequency over the past 2 days concerning for UTI. She reports falling at home and scraping her left elbow. She has been feeling lightheaded.   OV 02/17/2020 She reports she started having issues with her breathing 2 years ago after she suffered compound fractures in her back that led to an increase in the curvature of her thoracic spine. She was started on supplemental oxygen shortly after these events occurred. Per her chart she has exertional oxygen desaturations to 87% from her physical therapy notes. She also reports cough and wheezing intermittently. She denies sputum production. She is a never smoker but lived with a smoker for 20 years. She denies history of pneumonias.   She has been started on trelegy ellipta and she felt this helped her for a period of time but doesn't notice lasting relief from it anymore.   She has atrial fibrillation. She does have leg swelling and is taking torsemide.   She has received 2 covid vaccines. She received her pneumonia vaccine 2 weeks ago and she had the influenza vaccine this past fall.     She lived in [place] before but moved to [place] as she had frequent falls and lived on her own. Her daughter lives here in town.   Past Medical History:  Diagnosis Date   Acute diastolic CHF (  congestive heart failure) (HCC) 08/03/2020   Acute non-recurrent pansinusitis 10/21/2020   Acute on chronic respiratory failure with hypoxia (HCC) 08/02/2020   Asthma    Atrial fibrillation (HCC)    Dysphagia 08/02/2020   Elevated troponin 09/14/2020   High cholesterol    HTN (hypertension)    Hyperkalemia 09/14/2020   Hyponatremia 08/02/2020   Low grade squamous intraepithelial lesion on cytologic smear of cervix (LGSIL)  12/02/2019   Respiratory failure with hypoxia (HCC)      Family History  Problem Relation Age of Onset   Breast cancer Sister    Hypertension Other      Social History   Socioeconomic History   Marital status: Divorced    Spouse name: Not on file   Number of children: Not on file   Years of education: Not on file   Highest education level: Not on file  Occupational History   Not on file  Tobacco Use   Smoking status: Former    Types: Cigarettes   Smokeless tobacco: Never   Tobacco comments:    Quit remotely 1970s  Vaping Use   Vaping Use: Never used  Substance and Sexual Activity   Alcohol use: Not Currently   Drug use: Never   Sexual activity: Not Currently  Other Topics Concern   Not on file  Social History Narrative   Not on file   Social Determinants of Health   Financial Resource Strain: Not on file  Food Insecurity: Not on file  Transportation Needs: Not on file  Physical Activity: Not on file  Stress: Not on file  Social Connections: Not on file  Intimate Partner Violence: Not on file     Allergies  Allergen Reactions   Acetaminophen-Codeine Other (See Comments)    Agitation   Codeine Other (See Comments)    hallucinations     Outpatient Medications Prior to Visit  Medication Sig Dispense Refill   albuterol (PROVENTIL) (5 MG/ML) 0.5% nebulizer solution Take 0.5 mLs by nebulization every 6 (six) hours as needed for wheezing or shortness of breath.     albuterol (VENTOLIN HFA) 108 (90 Base) MCG/ACT inhaler Inhale 1-2 puffs into the lungs every 6 (six) hours as needed for wheezing or shortness of breath. 8 g 6   alendronate (FOSAMAX) 70 MG tablet Take 1 tablet (70 mg total) by mouth every Saturday. 12 tablet 3   apixaban (ELIQUIS) 5 MG TABS tablet TAKE 1 TABLET BY MOUTH 2 TIMES DAILY 60 tablet 5   atorvastatin (LIPITOR) 20 MG tablet TAKE 1 TABLET BY MOUTH EVERY EVENING 90 tablet 1   benzonatate (TESSALON) 200 MG capsule Take 1 capsule (200 mg total)  by mouth 3 (three) times daily as needed for cough. 30 capsule 0   Calcium-Magnesium-Vitamin D (CALCIUM 1200+D3 PO) Take 1 tablet by mouth in the morning.     cetirizine (ZYRTEC ALLERGY) 10 MG tablet Take 1 tablet (10 mg total) by mouth daily. 30 tablet 11   Elastic Bandages & Supports (MEDICAL COMPRESSION STOCKINGS) MISC Two pair knee high 30-54mmHg compression stockings. Sized XL. As covered by insurance for ICD-10: I50.33, R60.0. 2 each 1   guaiFENesin (MUCINEX) 600 MG 12 hr tablet Take 1 tablet (600 mg total) by mouth 2 (two) times daily. 30 tablet 0   ipratropium-albuterol (DUONEB) 0.5-2.5 (3) MG/3ML SOLN Inhale 3 mLs into the lungs every 6 (six) hours as needed (wheezing). 1080 each 3   LORazepam (ATIVAN) 1 MG tablet Take 1 tablet (1 mg total) by  mouth 2 (two) times daily as needed for anxiety. 60 tablet 2   Menthol (COUGH DROPS) 5.8 MG LOZG Use as directed 1 lozenge (5.8 mg total) in the mouth or throat as needed (for dry, sore, or scratchy throat). 36 lozenge 11   metoprolol succinate (TOPROL-XL) 50 MG 24 hr tablet Take 1 tablet (50 mg total) by mouth 2 (two) times daily. 180 tablet 3   mirtazapine (REMERON) 45 MG tablet TAKE 1 TABLET BY MOUTH AT BEDTIME 90 tablet 3   Multiple Vitamin (MULTIVITAMIN WITH MINERALS) TABS tablet Take 1 tablet by mouth in the morning.     OXYGEN Inhale 2 L into the lungs daily as needed (shortness of breath or activity level).     pantoprazole (PROTONIX) 20 MG tablet TAKE 1 TABLET BY MOUTH EVERY DAY 90 tablet 3   Phenazopyridine HCl 99.5 MG TABS Take 2 tablets by mouth 3 (three) times daily as needed (urinary pain).     sertraline (ZOLOFT) 100 MG tablet TAKE 1 TABLET BY MOUTH AT BEDTIME 90 tablet 1   torsemide (DEMADEX) 20 MG tablet TAKE 2 TABLETS DAILY EVERY MORNING. TAKE AN ADDITIONAL 2 TABLETS AS NEEDED FOR WEIGHT GAIN OF 2 POUNDS OVERNIGHT OR 5 POUNDS IN ONE WEEK. 225 tablet 5   potassium chloride (KLOR-CON) 10 MEQ tablet Take 2 tablets (20 mEq total) by mouth  daily. 30 tablet 0   azithromycin (ZITHROMAX) 250 MG tablet Take as directed 6 tablet 0   Fluticasone-Umeclidin-Vilant (TRELEGY ELLIPTA IN) Inhale 1 puff into the lungs daily.     No facility-administered medications prior to visit.   Review of Systems  Constitutional:  Negative for chills, fever, malaise/fatigue and weight loss.  HENT:  Positive for congestion and hearing loss. Negative for sinus pain and sore throat.   Eyes: Negative.   Respiratory:  Positive for cough, sputum production, shortness of breath and wheezing. Negative for hemoptysis.   Cardiovascular:  Negative for chest pain, palpitations, orthopnea, claudication and leg swelling.  Gastrointestinal:  Negative for abdominal pain, heartburn, nausea and vomiting.  Genitourinary: Negative.   Musculoskeletal:  Negative for back pain, falls, joint pain and myalgias.  Skin:  Negative for rash.  Neurological:  Positive for headaches. Negative for dizziness and weakness.  Endo/Heme/Allergies: Negative.   Psychiatric/Behavioral: Negative.      Objective:   Vitals:   12/20/21 1003  BP: 116/74  Pulse: 82  SpO2: 95%  Weight: 241 lb (109.3 kg)  Height: 5\' 9"  (1.753 m)    Physical Exam Constitutional:      General: She is not in acute distress. HENT:     Head: Normocephalic and atraumatic.  Eyes:     General: No scleral icterus.    Conjunctiva/sclera: Conjunctivae normal.     Pupils: Pupils are equal, round, and reactive to light.  Cardiovascular:     Rate and Rhythm: Normal rate and regular rhythm.     Pulses: Normal pulses.     Heart sounds: Normal heart sounds. No murmur heard. Pulmonary:     Breath sounds: Decreased air movement present. Decreased breath sounds and wheezing (scattered) present. No rhonchi or rales.  Abdominal:     General: Bowel sounds are normal.     Palpations: Abdomen is soft.  Musculoskeletal:     Right lower leg: No edema.     Left lower leg: No edema.     Comments: Scoliosis noted   Lymphadenopathy:     Cervical: No cervical adenopathy.  Skin:    General:  Skin is warm and dry.  Neurological:     General: No focal deficit present.     Mental Status: She is alert.  Psychiatric:        Mood and Affect: Mood normal.        Behavior: Behavior normal.        Thought Content: Thought content normal.        Judgment: Judgment normal.    CBC    Component Value Date/Time   WBC 8.5 07/11/2021 1409   WBC 9.4 05/13/2021 0539   RBC 4.31 07/11/2021 1409   HGB 12.8 07/11/2021 1409   HGB 13.0 10/21/2020 1104   HCT 40.0 07/11/2021 1409   HCT 39.5 10/21/2020 1104   PLT 273 07/11/2021 1409   PLT 320 10/21/2020 1104   MCV 92.8 07/11/2021 1409   MCV 94 10/21/2020 1104   MCH 29.7 07/11/2021 1409   MCHC 32.0 07/11/2021 1409   RDW 14.0 07/11/2021 1409   RDW 12.8 10/21/2020 1104   LYMPHSABS 1.1 07/11/2021 1409   LYMPHSABS 1.2 10/21/2020 1104   MONOABS 0.6 07/11/2021 1409   EOSABS 0.2 07/11/2021 1409   EOSABS 0.1 10/21/2020 1104   BASOSABS 0.1 07/11/2021 1409   BASOSABS 0.1 10/21/2020 1104      Latest Ref Rng & Units 07/11/2021    2:09 PM 05/13/2021    5:39 AM 05/12/2021    5:34 AM  BMP  Glucose 70 - 99 mg/dL 409  811  914   BUN 8 - 23 mg/dL 21  37  40   Creatinine 0.44 - 1.00 mg/dL 7.82  9.56  2.13   Sodium 135 - 145 mmol/L 145  141  140   Potassium 3.5 - 5.1 mmol/L 4.0  4.1  4.1   Chloride 98 - 111 mmol/L 99  96  95   CO2 22 - 32 mmol/L 36  36  38   Calcium 8.9 - 10.3 mg/dL 08.6  9.2  9.1    Chest imaging: CXR 12/07/21 Frontal and lateral views of the chest demonstrate an enlarged cardiac silhouette. There is ectasia of the thoracic aorta. There is increased central vascular congestion without overt edema. No effusion or pneumothorax. The lungs remain hyperinflated. Multiple thoracic compression deformities are again seen, with exaggerated thoracic kyphosis.  CXR 12/16/19 The lungs are under aerated with bronchovascular crowding and interstitial  prominence. Increased AP diameter on the lateral view. No focal airspace opacities.  Minimal blunting of both costophrenic angles.  Mild enlargement of the cardiac silhouette.  Prominent central vessels. The thoracic aorta is markedly tortuous. Moderate scattered bony degenerative changes. Increased kyphosis with decreased height of numerous thoracic vertebral bodies. Levoscoliosis of the upper thoracic spine. Diffuse decreased bone density.  PFT:     No data to display          Labs: [facility]ed from [facility] 1.11 on 12/02/19 Normal CBC  Assessment & Plan:   Chronic respiratory failure with hypoxia (HCC) - Plan: Pulmonary Function Test  Hearing loss of right ear, unspecified hearing loss type - Plan: Ambulatory referral to ENT  Subacute maxillary sinusitis - Plan: CT Maxillofacial LTD WO CM, predniSONE (DELTASONE) 10 MG tablet, amoxicillin-clavulanate (AUGMENTIN) 875-125 MG tablet  Moderate persistent asthma without complication - Plan: Fluticasone-Umeclidin-Vilant (TRELEGY ELLIPTA) 100-62.5-25 MCG/ACT AEPB  Discussion: Aviyah Desso is a 81 year old woman, never smoker with chronic respiratory failure on 2L of supplemental oxygen who returns to pulmonary clinic for asthma.  She was treated for asthma  exacerbation earlier this month without much improvement in how she feels. She is not wheezing on exam. She appears to have more issues with her sinuses at this time given the hearing changes on the right, dull appearing right tympanic membrane and sinus pressure/pain on exam. We will treat her with prednisone taper and augmentin. We will check CT of the sinuses. She is to start nebulizer treatments twice daily followed by flutter valve therapy.   She is to continue trelegy ellipta 1 puff daily and as needed albuterol inhaler/nebs.  Follow up in 3 months with pulmonary function tests.  Melody Comas, MD  Pulmonary & Critical Care Office:  [phone]    Current Outpatient Medications:    albuterol (PROVENTIL) (5 MG/ML) 0.5% nebulizer solution, Take 0.5 mLs by nebulization every 6 (six) hours as needed for wheezing or shortness of breath., Disp: , Rfl:    albuterol (VENTOLIN HFA) 108 (90 Base) MCG/ACT inhaler, Inhale 1-2 puffs into the lungs every 6 (six) hours as needed for wheezing or shortness of breath., Disp: 8 g, Rfl: 6   alendronate (FOSAMAX) 70 MG tablet, Take 1 tablet (70 mg total) by mouth every Saturday., Disp: 12 tablet, Rfl: 3   amoxicillin-clavulanate (AUGMENTIN) 875-125 MG tablet, Take 1 tablet by mouth 2 (two) times daily., Disp: 14 tablet, Rfl: 0   apixaban (ELIQUIS) 5 MG TABS tablet, TAKE 1 TABLET BY MOUTH 2 TIMES DAILY, Disp: 60 tablet, Rfl: 5   atorvastatin (LIPITOR) 20 MG tablet, TAKE 1 TABLET BY MOUTH EVERY EVENING, Disp: 90 tablet, Rfl: 1   benzonatate (TESSALON) 200 MG capsule, Take 1 capsule (200 mg total) by mouth 3 (three) times daily as needed for cough., Disp: 30 capsule, Rfl: 0   Calcium-Magnesium-Vitamin D (CALCIUM 1200+D3 PO), Take 1 tablet by mouth in the morning., Disp: , Rfl:    cetirizine (ZYRTEC ALLERGY) 10 MG tablet, Take 1 tablet (10 mg total) by mouth daily., Disp: 30 tablet, Rfl: 11   Elastic Bandages & Supports (MEDICAL COMPRESSION STOCKINGS) MISC, Two pair knee high 30-40mmHg compression stockings. Sized XL. As covered by insurance for ICD-10: I50.33, R60.0., Disp: 2 each, Rfl: 1   Fluticasone-Umeclidin-Vilant (TRELEGY ELLIPTA) 100-62.5-25 MCG/ACT AEPB, Inhale 1 puff into the lungs daily., Disp: 180 each, Rfl: 3   guaiFENesin (MUCINEX) 600 MG 12 hr tablet, Take 1 tablet (600 mg total) by mouth 2 (two) times daily., Disp: 30 tablet, Rfl: 0   ipratropium-albuterol (DUONEB) 0.5-2.5 (3) MG/3ML SOLN, Inhale 3 mLs into the lungs every 6 (six) hours as needed (wheezing)., Disp: 1080 each, Rfl: 3   LORazepam (ATIVAN) 1 MG tablet, Take 1 tablet (1 mg total) by mouth 2 (two) times daily as needed for  anxiety., Disp: 60 tablet, Rfl: 2   Menthol (COUGH DROPS) 5.8 MG LOZG, Use as directed 1 lozenge (5.8 mg total) in the mouth or throat as needed (for dry, sore, or scratchy throat)., Disp: 36 lozenge, Rfl: 11   metoprolol succinate (TOPROL-XL) 50 MG 24 hr tablet, Take 1 tablet (50 mg total) by mouth 2 (two) times daily., Disp: 180 tablet, Rfl: 3   mirtazapine (REMERON) 45 MG tablet, TAKE 1 TABLET BY MOUTH AT BEDTIME, Disp: 90 tablet, Rfl: 3   Multiple Vitamin (MULTIVITAMIN WITH MINERALS) TABS tablet, Take 1 tablet by mouth in the morning., Disp: , Rfl:    OXYGEN, Inhale 2 L into the lungs daily as needed (shortness of breath or activity level)., Disp: , Rfl:    pantoprazole (PROTONIX) 20 MG tablet, TAKE 1  TABLET BY MOUTH EVERY DAY, Disp: 90 tablet, Rfl: 3   Phenazopyridine HCl 99.5 MG TABS, Take 2 tablets by mouth 3 (three) times daily as needed (urinary pain)., Disp: , Rfl:    predniSONE (DELTASONE) 10 MG tablet, Take 4 tablets (40 mg total) by mouth daily with breakfast for 3 days, THEN 3 tablets (30 mg total) daily with breakfast for 3 days, THEN 2 tablets (20 mg total) daily with breakfast for 3 days, THEN 1 tablet (10 mg total) daily with breakfast for 3 days., Disp: 30 tablet, Rfl: 0   sertraline (ZOLOFT) 100 MG tablet, TAKE 1 TABLET BY MOUTH AT BEDTIME, Disp: 90 tablet, Rfl: 1   torsemide (DEMADEX) 20 MG tablet, TAKE 2 TABLETS DAILY EVERY MORNING. TAKE AN ADDITIONAL 2 TABLETS AS NEEDED FOR WEIGHT GAIN OF 2 POUNDS OVERNIGHT OR 5 POUNDS IN ONE WEEK., Disp: 225 tablet, Rfl: 5   potassium chloride (KLOR-CON) 10 MEQ tablet, Take 2 tablets (20 mEq total) by mouth daily., Disp: 30 tablet, Rfl: 0

## 2021-12-20 NOTE — Telephone Encounter (Signed)
Left message for patient to call and schedule overdue follow up per  12/20/21 staff message form Laurann Montana, NP

## 2021-12-20 NOTE — Patient Instructions (Addendum)
Continue trelegy ellipta 1 puff daily - rinse mouth out after each use  Use nebulizer treatments twice daily followed by flutter valve therapy to help mucous clearance - you can do this for up to 2 weeks  We will refer you to ENT for evaluation of your hearing loss and sinusitis  We will check a sinus CT scan  Start prednisone taper and augmentin for sinusitis  We will check pulmonary function tests in the future  Follow up in 3 months with PFTs

## 2021-12-22 ENCOUNTER — Encounter: Payer: Self-pay | Admitting: Pulmonary Disease

## 2021-12-23 NOTE — Telephone Encounter (Signed)
Mychart message sent by pt's daughter: Lindsay [name]  [facility] (supporting Freddi Starr, MD)19 hours ago (1:05 PM)    Hi  Mom has a cardio appt Dec 11th and I called [facility] (not sure that was who the referral was made to) and they don't have a referral yet but she said it can take a week before they get it and even then, they couldn't see her until January. I wanted to ask if you thought it was ok to wait that long. She is scheduled for the CT sinus for 12-7.  Thank you for all that you do,  [name] (her daughter)     Referral was placed after pt's last OV for her to be referred to ENT. PCCs, please advise on this.

## 2021-12-23 NOTE — Telephone Encounter (Signed)
Livingston Diones, RT  [place] hours ago (12:13 PM)    Spoke with Myrlene Broker at [facility] - she confirmed that they received Referral and will be giving her a call today.   Livingston Diones, RT  You2 hours ago (11:59 AM)    Referral has been faxed again to [facility]

## 2021-12-26 ENCOUNTER — Encounter: Payer: Self-pay | Admitting: Pulmonary Disease

## 2021-12-26 DIAGNOSIS — R278 Other lack of coordination: Secondary | ICD-10-CM | POA: Diagnosis not present

## 2021-12-26 DIAGNOSIS — M6281 Muscle weakness (generalized): Secondary | ICD-10-CM | POA: Diagnosis not present

## 2021-12-27 ENCOUNTER — Other Ambulatory Visit: Payer: Self-pay | Admitting: Family

## 2021-12-27 DIAGNOSIS — R278 Other lack of coordination: Secondary | ICD-10-CM | POA: Diagnosis not present

## 2021-12-27 DIAGNOSIS — I480 Paroxysmal atrial fibrillation: Secondary | ICD-10-CM

## 2021-12-27 DIAGNOSIS — M6281 Muscle weakness (generalized): Secondary | ICD-10-CM | POA: Diagnosis not present

## 2021-12-27 NOTE — Telephone Encounter (Signed)
PCC's please help change her CT to GI rather than [facility], thanks!

## 2021-12-27 NOTE — Telephone Encounter (Signed)
Please review for refill. Thank you! 

## 2021-12-27 NOTE — Telephone Encounter (Signed)
Prescription refill request for Eliquis received. Indication:afib Last office visit:upcoming Scr:0.9 Age: 81 Weight:109.3  kg  Prescription refilled

## 2021-12-29 ENCOUNTER — Ambulatory Visit (HOSPITAL_COMMUNITY): Payer: Medicare HMO

## 2022-01-01 DIAGNOSIS — M810 Age-related osteoporosis without current pathological fracture: Secondary | ICD-10-CM | POA: Diagnosis not present

## 2022-01-01 DIAGNOSIS — I509 Heart failure, unspecified: Secondary | ICD-10-CM | POA: Diagnosis not present

## 2022-01-01 DIAGNOSIS — E119 Type 2 diabetes mellitus without complications: Secondary | ICD-10-CM | POA: Diagnosis not present

## 2022-01-01 DIAGNOSIS — I4891 Unspecified atrial fibrillation: Secondary | ICD-10-CM | POA: Diagnosis not present

## 2022-01-01 DIAGNOSIS — I10 Essential (primary) hypertension: Secondary | ICD-10-CM | POA: Diagnosis not present

## 2022-01-01 DIAGNOSIS — J449 Chronic obstructive pulmonary disease, unspecified: Secondary | ICD-10-CM | POA: Diagnosis not present

## 2022-01-01 DIAGNOSIS — E785 Hyperlipidemia, unspecified: Secondary | ICD-10-CM | POA: Diagnosis not present

## 2022-01-01 DIAGNOSIS — E038 Other specified hypothyroidism: Secondary | ICD-10-CM | POA: Diagnosis not present

## 2022-01-01 DIAGNOSIS — D518 Other vitamin B12 deficiency anemias: Secondary | ICD-10-CM | POA: Diagnosis not present

## 2022-01-02 ENCOUNTER — Other Ambulatory Visit: Payer: Self-pay | Admitting: Pulmonary Disease

## 2022-01-02 ENCOUNTER — Encounter (HOSPITAL_BASED_OUTPATIENT_CLINIC_OR_DEPARTMENT_OTHER): Payer: Self-pay | Admitting: Family

## 2022-01-02 ENCOUNTER — Ambulatory Visit (HOSPITAL_BASED_OUTPATIENT_CLINIC_OR_DEPARTMENT_OTHER)
Admission: RE | Admit: 2022-01-02 | Discharge: 2022-01-02 | Disposition: A | Discharge status: Home / Self Care | Payer: Medicare HMO | Source: Ambulatory Visit | Attending: Pulmonary Disease | Admitting: Pulmonary Disease

## 2022-01-02 ENCOUNTER — Ambulatory Visit (HOSPITAL_BASED_OUTPATIENT_CLINIC_OR_DEPARTMENT_OTHER): Payer: Medicare HMO | Admitting: Family

## 2022-01-02 VITALS — BP 86/64 | HR 84 | Ht 69.0 in | Wt 244.0 lb

## 2022-01-02 DIAGNOSIS — D6859 Other primary thrombophilia: Secondary | ICD-10-CM | POA: Diagnosis not present

## 2022-01-02 DIAGNOSIS — J0100 Acute maxillary sinusitis, unspecified: Secondary | ICD-10-CM | POA: Diagnosis not present

## 2022-01-02 DIAGNOSIS — B370 Candidal stomatitis: Secondary | ICD-10-CM | POA: Diagnosis not present

## 2022-01-02 DIAGNOSIS — J019 Acute sinusitis, unspecified: Secondary | ICD-10-CM | POA: Diagnosis not present

## 2022-01-02 DIAGNOSIS — I4819 Other persistent atrial fibrillation: Secondary | ICD-10-CM

## 2022-01-02 DIAGNOSIS — I5032 Chronic diastolic (congestive) heart failure: Secondary | ICD-10-CM

## 2022-01-02 MED ORDER — POTASSIUM CHLORIDE ER 20 MEQ PO TBCR: 20.0000 meq | EXTENDED_RELEASE_TABLET | Freq: Every day | ORAL | 3 refills | Status: DC

## 2022-01-02 MED ORDER — FLUCONAZOLE 150 MG PO TABS: 150.0000 mg | ORAL_TABLET | Freq: Once | ORAL | 0 refills | Status: AC

## 2022-01-02 NOTE — Patient Instructions (Signed)
Medication Instructions:  Your physician has recommended you make the following change in your medication:   Fluconazole '150mg'$  tablet once   *If you need a refill on your cardiac medications before your next appointment, please call your pharmacy*  Follow-Up: At [facility], you and your health needs are our priority.  As part of our continuing mission to provide you with exceptional heart care, we have created designated Provider Care Teams.  These Care Teams include your primary Cardiologist (physician) and Advanced Practice Providers (APPs -  Physician Assistants and Nurse Practitioners) who all work together to provide you with the care you need, when you need it.  We recommend signing up for the patient portal called "MyChart".  Sign up information is provided on this After Visit Summary.  MyChart is used to connect with patients for Virtual Visits (Telemedicine).  Patients are able to view lab/test results, encounter notes, upcoming appointments, etc.  Non-urgent messages can be sent to your provider as well.   To learn more about what you can do with MyChart, go to [email or web address].    Your next appointment:   4 month(s)  The format for your next appointment:   In Person  Provider:   Raliegh Ip Mali Hilty, MD or Laurann Montana, NP    Other Instructions Please call us if your heart rate is consistently staying over 100.

## 2022-01-02 NOTE — Progress Notes (Unsigned)
Office Visit    [name] Date of Encounter: 01/03/2022  PCP:  [name], NP   [place]  Cardiologist:  Pixie Casino, MD  Advanced Practice Provider:  No care team member to display Electrophysiologist:  None   Chief Complaint    Lindsay [name] is a 81 y.o. female  presents today for overdue follow-up of atrial fibrillation  Past Medical History    Past Medical History:  Diagnosis Date   Acute diastolic CHF (congestive heart failure) ([place]) 08/03/2020   Acute non-recurrent pansinusitis 10/21/2020   Acute on chronic respiratory failure with hypoxia ([place]) 08/02/2020   Asthma    Atrial fibrillation ([place])    Dysphagia 08/02/2020   Elevated troponin 09/14/2020   High cholesterol    HTN (hypertension)    Hyperkalemia 09/14/2020   Hyponatremia 08/02/2020   Low grade squamous intraepithelial lesion on cytologic smear of cervix (LGSIL) 12/02/2019   Respiratory failure with hypoxia [facility])    Past Surgical History:  Procedure Laterality Date   CARDIOVERSION N/A 09/13/2020   Procedure: CARDIOVERSION;  Surgeon: [name], MD;  Location: [facility] ENDOSCOPY;  Service: Cardiovascular;  Laterality: N/A;   ESOPHAGOGASTRODUODENOSCOPY N/A 08/05/2020   Procedure: ESOPHAGOGASTRODUODENOSCOPY (EGD);  Surgeon: Otis Brace, MD;  Location: [facility] ENDOSCOPY;  Service: Gastroenterology;  Laterality: N/A;    Allergies  Allergies  Allergen Reactions   Acetaminophen-Codeine Other (See Comments)    Agitation   Codeine Other (See Comments)    hallucinations    History of Present Illness    Lindsay [name] is a 81 y.o. 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 Her daughter is an Therapist, sports. Atrial fibrillation diagnosed  many years ago and Eliquis initiated by primary care. Managed on Torsemide for chronic edema with improvement in fall 2021 after addition of Metolazone.   She was admitted 08/02/20 with acute on chronic hypoxic respiratory failure due to acute on chronic diastolic heart failure. 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 Treated with IV diuresis. She was evaluated by speech therapy and also underwent endoscopy with achalasia recommended for soft diet. She was discharged with [facility].  Seen in clinic 08/24/2020.  She subsequently underwent successful cardioversion 09/13/2020.  She has not been seen in clinic since that time.  Admitted 04/2021 with acute on chronic respiratory failure and asthma exacerbation.  Echocardiogram 05/10/2021 during admission with LVEF 60 to 65%, no RWMA, normal pulmonary pressure, left atrium mildly dilated, trivial MR.  She was seen 11/50/23 by pulmonology and treated for asthma exacerbation without much improvement at follow-up 12/20/2021.  She was treated with prednisone taper and Augmentin for sinus issues and also recommended for CT of the sinuses.  She presents today for follow up with her daughter.  She resides in assisted living facility.  EKG today notes rate controlled atrial fibrillation.  She notes palpitations only if she is up moving but overall not bothered by her arrhythmia.  Notes her lower extremity edema is well-controlled on torsemide with additional dose in the afternoon about every 2 weeks at the direction of her primary care provider.  Denies bleeding complications on Eliquis.  Denies chest pain, pressure, tightness.  No orthopnea, PND.  Morning she did wake some with some dizziness described  as spinning which resolved with sitting down and has not recurred.  Notes her exertional dyspnea is stable at baseline.  She has been having upper airway congestion for 3 weeks and feels her right ear is stopped up with CT upcoming  per pulmonology.  Also notes she has a blister on the top of her mouth that developed over the last few days and burns when she drinks things such as Coca-Cola.  EKGs/Labs/Other Studies Reviewed:   The following studies were reviewed today:  Echo 05/10/21  1. Left ventricular ejection fraction, by estimation, is 60 to 65%. The  left ventricle has normal function. The left ventricle has no regional  wall motion abnormalities. Left ventricular diastolic function could not  be evaluated.   2. Right ventricular systolic function is normal. The right ventricular  size is normal. There is normal pulmonary artery systolic pressure. The  estimated right ventricular systolic pressure is 34.2 mmHg.   3. Left atrial size was mildly dilated.   4. The mitral valve is grossly normal. Trivial mitral valve  regurgitation. No evidence of mitral stenosis.   5. The aortic valve was not well visualized. Aortic valve regurgitation  is not visualized. Aortic valve sclerosis is present, with no evidence of  aortic valve stenosis.   6. The inferior vena cava is dilated in size with >50% respiratory  variability, suggesting right atrial pressure of 8 mmHg.   Comparison(s): No significant change from prior study.   2D Echo 08/03/20  1. Left ventricular ejection fraction, by estimation, is 60 to 65%. The  left ventricle has normal function. The left ventricle has no regional  wall motion abnormalities. Left ventricular diastolic function could not  be evaluated.   2. Right ventricular systolic function is normal. The right ventricular  size is mildly enlarged.   3. The mitral valve is normal in structure. Trivial mitral valve  regurgitation. No evidence of mitral stenosis.   4. The aortic valve is tricuspid. Aortic valve regurgitation is not  visualized. Mild aortic valve sclerosis is present, with no evidence of  aortic valve stenosis.   5. The inferior vena cava is normal in size with greater than 50%   respiratory variability, suggesting right atrial pressure of 3 mmHg.    CHA2DS2-VASc Score = 6   This indicates a 9.7% annual risk of stroke. The patient's score is based upon: CHF History: 1 HTN History: 1 Diabetes History: 0 Stroke History: 0 Vascular Disease History: 1 Age Score: 2 Gender Score: 1      EKG:  EKG is ordered today.  The ekg ordered today demonstrates rate controlled atrial fibrillation 82 bpm with no acute ST/T wave changes.   Recent Labs: 05/09/2021: B Natriuretic Peptide 166.7 05/10/2021: ALT 15 05/13/2021: Magnesium 2.3 07/11/2021: BUN 21; Creatinine 0.98; Hemoglobin 12.8; Platelet Count 273; Potassium 4.0; Sodium 145  Recent Lipid Panel    Component Value Date/Time   CHOL 179 08/04/2020 0359   TRIG 64 08/04/2020 0359   HDL 74 08/04/2020 0359   CHOLHDL 2.4 08/04/2020 0359   VLDL 13 08/04/2020 0359   LDLCALC 92 08/04/2020 0359   LDLDIRECT 113 (H) 09/09/2020 0945    Home Medications   Current Meds  Medication Sig   albuterol (PROVENTIL) (5 MG/ML) 0.5% nebulizer solution Take 0.5 mLs by nebulization every 6 (six) hours as needed for wheezing or shortness of breath.   alendronate (FOSAMAX) 70 MG tablet Take 1 tablet (70 mg total) by mouth every Saturday.   apixaban (ELIQUIS)  5 MG TABS tablet Take 1 tablet (5 mg total) by mouth 2 (two) times daily.   atorvastatin (LIPITOR) 20 MG tablet TAKE 1 TABLET BY MOUTH EVERY EVENING   benzonatate (TESSALON) 200 MG capsule Take 1 capsule (200 mg total) by mouth 3 (three) times daily as needed for cough.   Calcium-Magnesium-Vitamin D (CALCIUM 1200+D3 PO) Take 1 tablet by mouth in the morning.   cetirizine (ZYRTEC ALLERGY) 10 MG tablet Take 1 tablet (10 mg total) by mouth daily.   Elastic Bandages & Supports (MEDICAL COMPRESSION STOCKINGS) MISC Two pair knee high 30-5mHg compression stockings. Sized XL. As covered by insurance for ICD-10: I50.33, R60.0.   [EXPIRED] fluconazole (DIFLUCAN) 150 MG tablet Take 1 tablet  (150 mg total) by mouth once for 1 dose.   Fluticasone-Umeclidin-Vilant (TRELEGY ELLIPTA) 100-62.5-25 MCG/ACT AEPB Inhale 1 puff into the lungs daily.   guaiFENesin (MUCINEX) 600 MG 12 hr tablet Take 1 tablet (600 mg total) by mouth 2 (two) times daily.   ipratropium-albuterol (DUONEB) 0.5-2.5 (3) MG/3ML SOLN Inhale 3 mLs into the lungs every 6 (six) hours as needed (wheezing).   LORazepam (ATIVAN) 1 MG tablet Take 1 tablet (1 mg total) by mouth 2 (two) times daily as needed for anxiety.   Menthol (COUGH DROPS) 5.8 MG LOZG Use as directed 1 lozenge (5.8 mg total) in the mouth or throat as needed (for dry, sore, or scratchy throat).   metoprolol tartrate (LOPRESSOR) 25 MG tablet Take 25 mg by mouth 2 (two) times daily.   mirtazapine (REMERON) 45 MG tablet TAKE 1 TABLET BY MOUTH AT BEDTIME   Multiple Vitamin (MULTIVITAMIN WITH MINERALS) TABS tablet Take 1 tablet by mouth in the morning.   OXYGEN Inhale 2 L into the lungs daily as needed (shortness of breath or activity level).   pantoprazole (PROTONIX) 20 MG tablet TAKE 1 TABLET BY MOUTH EVERY DAY   Phenazopyridine HCl 99.5 MG TABS Take 2 tablets by mouth 3 (three) times daily as needed (urinary pain).   sertraline (ZOLOFT) 100 MG tablet TAKE 1 TABLET BY MOUTH AT BEDTIME   torsemide (DEMADEX) 20 MG tablet TAKE 2 TABLETS DAILY EVERY MORNING. TAKE AN ADDITIONAL 2 TABLETS AS NEEDED FOR WEIGHT GAIN OF 2 POUNDS OVERNIGHT OR 5 POUNDS IN ONE WEEK.     Review of Systems     All other systems reviewed and are otherwise negative except as noted above.  Physical Exam    VS:  BP (!) 86/64   Pulse 84   Ht '5\' 9"'$  (1.753 m)   Wt 244 lb (110.7 kg)   BMI 36.03 kg/m  , BMI Body mass index is 36.03 kg/m.  Wt Readings from Last 3 Encounters:  01/02/22 244 lb (110.7 kg)  12/20/21 241 lb (109.3 kg)  12/07/21 241 lb (109.3 kg)    GEN: Well nourished, overweight, well developed, in no acute distress. HEENT: normal. Neck: Supple, no JVD, carotid bruits, or  masses. Cardiac: Irregularly irregular, no murmurs, rubs, or gallops. No clubbing, cyanosis, edema.  Radials/PT 2+ and equal bilaterally.  Respiratory:  Respirations regular and unlabored, clear to auscultation bilaterally. GI: Soft, nontender, nondistended. MS: No deformity or atrophy. Skin: Warm and dry, no rash. Neuro:  Strength and sensation are intact. Psych: Normal affect.  Assessment & Plan    Chronic hypoxic respiratory failure / HFpEF -volume status difficult to ascertain based on body habitus.  Notes her exertional dyspnea and lower extremity edema are stable at baseline.  Continue torsemide 40 mg daily with  additional 40 mg in the afternoon as needed for lower extremity edema. Low sodium diet, fluid restriction <2L, and daily weights encouraged. Educated to contact our office for weight gain of 2 lbs overnight or 5 lbs in one week. .   Atrial fibrillation / Chronic anticoagulation -rate controlled by EKG today.  Continue present dose of Toprol 25 mg daily.  Continue Eliquis 5 mg twice daily. CHA2DS2-VASc Score = 6 [CHF History: 1, HTN History: 1, Diabetes History: 0, Stroke History: 0, Vascular Disease History: 1, Age Score: 2, Gender Score: 1].  Therefore, the patient's annual risk of stroke is 9.7 %.    As she is overall unaware of atrial fib with only palpitations with more than usual activity, will continue with rate control. Echo 04/2021 normal LVEF.  She brings patient assistance paperwork for Eliquis which we will submit today.  HLD -continue Atorvastatin. Due for fasting lipid, unable to collect today as she is not fasting. Readdress at follow up.   Hyokalemia -Likely due to diuretic therapy.  Continue present potassium supplement.  Refill provided  Obesity - Weight loss via diet and exercise encouraged. Discussed the impact being overweight would have on cardiovascular risk, atrial fibrillation, HLD, and HFpEF.  Thrush - Raw spot noted to upper oral palate and back of mouth  with thrush-like appearance. Will Rx Fluconazole '150mg'$  for one dose. Further follow up with primary care.   Disposition: Follow up in 4 month(s) with Dr. Debara Pickett or APP.  Signed, Loel Dubonnet, NP 01/03/2022, 11:07 AM [place]

## 2022-01-03 DIAGNOSIS — R278 Other lack of coordination: Secondary | ICD-10-CM | POA: Diagnosis not present

## 2022-01-03 DIAGNOSIS — M6281 Muscle weakness (generalized): Secondary | ICD-10-CM | POA: Diagnosis not present

## 2022-01-05 ENCOUNTER — Other Ambulatory Visit (HOSPITAL_BASED_OUTPATIENT_CLINIC_OR_DEPARTMENT_OTHER): Payer: Self-pay | Admitting: Nurse Practitioner

## 2022-01-05 DIAGNOSIS — F411 Generalized anxiety disorder: Secondary | ICD-10-CM

## 2022-01-05 DIAGNOSIS — M6281 Muscle weakness (generalized): Secondary | ICD-10-CM | POA: Diagnosis not present

## 2022-01-05 DIAGNOSIS — I5033 Acute on chronic diastolic (congestive) heart failure: Secondary | ICD-10-CM

## 2022-01-05 DIAGNOSIS — R278 Other lack of coordination: Secondary | ICD-10-CM | POA: Diagnosis not present

## 2022-01-05 DIAGNOSIS — M810 Age-related osteoporosis without current pathological fracture: Secondary | ICD-10-CM

## 2022-01-05 DIAGNOSIS — F3341 Major depressive disorder, recurrent, in partial remission: Secondary | ICD-10-CM

## 2022-01-06 ENCOUNTER — Encounter (HOSPITAL_BASED_OUTPATIENT_CLINIC_OR_DEPARTMENT_OTHER): Payer: Self-pay

## 2022-01-09 ENCOUNTER — Other Ambulatory Visit: Payer: Self-pay | Admitting: Nurse Practitioner

## 2022-01-10 ENCOUNTER — Ambulatory Visit (HOSPITAL_COMMUNITY): Payer: Medicare HMO

## 2022-01-10 ENCOUNTER — Ambulatory Visit (HOSPITAL_BASED_OUTPATIENT_CLINIC_OR_DEPARTMENT_OTHER): Payer: Medicare HMO

## 2022-01-10 DIAGNOSIS — M6281 Muscle weakness (generalized): Secondary | ICD-10-CM | POA: Diagnosis not present

## 2022-01-10 DIAGNOSIS — R278 Other lack of coordination: Secondary | ICD-10-CM | POA: Diagnosis not present

## 2022-01-12 DIAGNOSIS — M6281 Muscle weakness (generalized): Secondary | ICD-10-CM | POA: Diagnosis not present

## 2022-01-12 DIAGNOSIS — R278 Other lack of coordination: Secondary | ICD-10-CM | POA: Diagnosis not present

## 2022-01-17 ENCOUNTER — Encounter (HOSPITAL_BASED_OUTPATIENT_CLINIC_OR_DEPARTMENT_OTHER): Payer: Self-pay

## 2022-01-18 ENCOUNTER — Telehealth (HOSPITAL_BASED_OUTPATIENT_CLINIC_OR_DEPARTMENT_OTHER): Payer: Self-pay

## 2022-01-18 NOTE — Telephone Encounter (Signed)
   Pre-operative Risk Assessment    [name]  DOB: 1940-06-11 MRN: [id number]     Request for Surgical Clearance    Procedure:  Dental Extraction - Amount of Teeth to be Pulled:  Radiographs, local anesthetic w/ epinephrine, fillings, crowns, bridges, and extractions  Date of Surgery:  Clearance TBD                                 Surgeon:  Andria Meuse, DMD, [facility] Surgeon's Group or Practice Name:  [facility]  Phone number:  [phone] Fax number:  [phone]   Type of Clearance Requested:   - Medical  - Pharmacy:  Hold Apixaban (Eliquis) How long to hold?    Type of Anesthesia:  Local    Additional requests/questions:    Signed, Gerald Stabs   01/18/2022, 3:44 PM

## 2022-01-19 NOTE — Telephone Encounter (Signed)
Placed call to [facility] regarding surgical clearance.  They aren't sure what the pt will need, as she is coming in for a  Emergency visit today.  They have been advised to call us, once pt has been seen, and if they need clearance to let us know pt is in chair and to ask for the preop team and will address at that time.

## 2022-01-20 DIAGNOSIS — R490 Dysphonia: Secondary | ICD-10-CM | POA: Diagnosis not present

## 2022-01-20 DIAGNOSIS — H6501 Acute serous otitis media, right ear: Secondary | ICD-10-CM | POA: Diagnosis not present

## 2022-01-24 DIAGNOSIS — R278 Other lack of coordination: Secondary | ICD-10-CM | POA: Diagnosis not present

## 2022-01-24 DIAGNOSIS — M6281 Muscle weakness (generalized): Secondary | ICD-10-CM | POA: Diagnosis not present

## 2022-01-26 DIAGNOSIS — M6281 Muscle weakness (generalized): Secondary | ICD-10-CM | POA: Diagnosis not present

## 2022-01-26 DIAGNOSIS — R278 Other lack of coordination: Secondary | ICD-10-CM | POA: Diagnosis not present

## 2022-02-06 ENCOUNTER — Other Ambulatory Visit: Payer: Self-pay | Admitting: Pulmonary Disease

## 2022-02-06 ENCOUNTER — Other Ambulatory Visit (HOSPITAL_BASED_OUTPATIENT_CLINIC_OR_DEPARTMENT_OTHER): Payer: Self-pay | Admitting: Nurse Practitioner

## 2022-02-06 DIAGNOSIS — J302 Other seasonal allergic rhinitis: Secondary | ICD-10-CM

## 2022-02-06 DIAGNOSIS — E119 Type 2 diabetes mellitus without complications: Secondary | ICD-10-CM | POA: Diagnosis not present

## 2022-02-06 DIAGNOSIS — F411 Generalized anxiety disorder: Secondary | ICD-10-CM

## 2022-02-06 DIAGNOSIS — I10 Essential (primary) hypertension: Secondary | ICD-10-CM | POA: Diagnosis not present

## 2022-02-06 DIAGNOSIS — M810 Age-related osteoporosis without current pathological fracture: Secondary | ICD-10-CM | POA: Diagnosis not present

## 2022-02-06 DIAGNOSIS — E785 Hyperlipidemia, unspecified: Secondary | ICD-10-CM | POA: Diagnosis not present

## 2022-02-06 DIAGNOSIS — J449 Chronic obstructive pulmonary disease, unspecified: Secondary | ICD-10-CM | POA: Diagnosis not present

## 2022-02-06 DIAGNOSIS — D518 Other vitamin B12 deficiency anemias: Secondary | ICD-10-CM | POA: Diagnosis not present

## 2022-02-06 DIAGNOSIS — E038 Other specified hypothyroidism: Secondary | ICD-10-CM | POA: Diagnosis not present

## 2022-02-06 DIAGNOSIS — J4521 Mild intermittent asthma with (acute) exacerbation: Secondary | ICD-10-CM

## 2022-02-06 DIAGNOSIS — I4891 Unspecified atrial fibrillation: Secondary | ICD-10-CM | POA: Diagnosis not present

## 2022-02-06 DIAGNOSIS — I509 Heart failure, unspecified: Secondary | ICD-10-CM | POA: Diagnosis not present

## 2022-02-07 DIAGNOSIS — M6281 Muscle weakness (generalized): Secondary | ICD-10-CM | POA: Diagnosis not present

## 2022-02-07 DIAGNOSIS — R278 Other lack of coordination: Secondary | ICD-10-CM | POA: Diagnosis not present

## 2022-02-08 ENCOUNTER — Encounter: Payer: Self-pay | Admitting: Pulmonary Disease

## 2022-02-08 DIAGNOSIS — H9191 Unspecified hearing loss, right ear: Secondary | ICD-10-CM

## 2022-02-08 DIAGNOSIS — J0100 Acute maxillary sinusitis, unspecified: Secondary | ICD-10-CM

## 2022-02-09 DIAGNOSIS — R278 Other lack of coordination: Secondary | ICD-10-CM | POA: Diagnosis not present

## 2022-02-09 DIAGNOSIS — M6281 Muscle weakness (generalized): Secondary | ICD-10-CM | POA: Diagnosis not present

## 2022-02-09 NOTE — Telephone Encounter (Signed)
Mychart message sent by pt: Lindsay [name] [facility] (supporting Freddi Starr, MD)13 hours ago (7:45 PM)    Hello,  I visited the ENT in December in their [place] office only bc they could see me earlier than the [place] office. I was very disappointed as they had not even reviewed the CT you ordered prior to the visit. I was given a new prescription for prednisone which I had already completed after seeing you. I was not told any diagnosis as to why I had the hearing loss or having all of the upper airway secretions. The hearing loss has improved but the secretions have gotten much worse. Would I need to see another ENT or Gi specialist? If ENT, could you please refer me to [facility] in [place]. I do not want to go back to the [facility] office as it was fruitless. Thank you so much.      Routing to Dr. Erin Fulling for review.

## 2022-02-15 ENCOUNTER — Other Ambulatory Visit (HOSPITAL_BASED_OUTPATIENT_CLINIC_OR_DEPARTMENT_OTHER): Payer: Self-pay | Admitting: Nurse Practitioner

## 2022-02-15 DIAGNOSIS — M6281 Muscle weakness (generalized): Secondary | ICD-10-CM | POA: Diagnosis not present

## 2022-02-15 DIAGNOSIS — R278 Other lack of coordination: Secondary | ICD-10-CM | POA: Diagnosis not present

## 2022-02-15 DIAGNOSIS — F411 Generalized anxiety disorder: Secondary | ICD-10-CM

## 2022-02-15 NOTE — Telephone Encounter (Signed)
Refill request, last apt was 03/09/21. I called pt. And sent mychart message to schedule an pt. With you at your new location.

## 2022-02-22 DIAGNOSIS — R278 Other lack of coordination: Secondary | ICD-10-CM | POA: Diagnosis not present

## 2022-02-22 DIAGNOSIS — M6281 Muscle weakness (generalized): Secondary | ICD-10-CM | POA: Diagnosis not present

## 2022-02-28 DIAGNOSIS — M6281 Muscle weakness (generalized): Secondary | ICD-10-CM | POA: Diagnosis not present

## 2022-02-28 DIAGNOSIS — R278 Other lack of coordination: Secondary | ICD-10-CM | POA: Diagnosis not present

## 2022-03-01 ENCOUNTER — Other Ambulatory Visit: Payer: Self-pay | Admitting: Nurse Practitioner

## 2022-03-02 DIAGNOSIS — M79672 Pain in left foot: Secondary | ICD-10-CM | POA: Diagnosis not present

## 2022-03-02 DIAGNOSIS — E1140 Type 2 diabetes mellitus with diabetic neuropathy, unspecified: Secondary | ICD-10-CM | POA: Diagnosis not present

## 2022-03-02 DIAGNOSIS — M79671 Pain in right foot: Secondary | ICD-10-CM | POA: Diagnosis not present

## 2022-03-02 DIAGNOSIS — L600 Ingrowing nail: Secondary | ICD-10-CM | POA: Diagnosis not present

## 2022-03-02 DIAGNOSIS — B351 Tinea unguium: Secondary | ICD-10-CM | POA: Diagnosis not present

## 2022-03-02 DIAGNOSIS — L84 Corns and callosities: Secondary | ICD-10-CM | POA: Diagnosis not present

## 2022-03-04 DIAGNOSIS — D518 Other vitamin B12 deficiency anemias: Secondary | ICD-10-CM | POA: Diagnosis not present

## 2022-03-04 DIAGNOSIS — I509 Heart failure, unspecified: Secondary | ICD-10-CM | POA: Diagnosis not present

## 2022-03-04 DIAGNOSIS — E785 Hyperlipidemia, unspecified: Secondary | ICD-10-CM | POA: Diagnosis not present

## 2022-03-04 DIAGNOSIS — I4891 Unspecified atrial fibrillation: Secondary | ICD-10-CM | POA: Diagnosis not present

## 2022-03-04 DIAGNOSIS — J449 Chronic obstructive pulmonary disease, unspecified: Secondary | ICD-10-CM | POA: Diagnosis not present

## 2022-03-04 DIAGNOSIS — E038 Other specified hypothyroidism: Secondary | ICD-10-CM | POA: Diagnosis not present

## 2022-03-04 DIAGNOSIS — I10 Essential (primary) hypertension: Secondary | ICD-10-CM | POA: Diagnosis not present

## 2022-03-04 DIAGNOSIS — E119 Type 2 diabetes mellitus without complications: Secondary | ICD-10-CM | POA: Diagnosis not present

## 2022-03-04 DIAGNOSIS — M810 Age-related osteoporosis without current pathological fracture: Secondary | ICD-10-CM | POA: Diagnosis not present

## 2022-03-08 DIAGNOSIS — M6281 Muscle weakness (generalized): Secondary | ICD-10-CM | POA: Diagnosis not present

## 2022-03-08 DIAGNOSIS — R278 Other lack of coordination: Secondary | ICD-10-CM | POA: Diagnosis not present

## 2022-03-09 DIAGNOSIS — Z7951 Long term (current) use of inhaled steroids: Secondary | ICD-10-CM | POA: Diagnosis not present

## 2022-03-09 DIAGNOSIS — H269 Unspecified cataract: Secondary | ICD-10-CM | POA: Diagnosis not present

## 2022-03-09 DIAGNOSIS — E785 Hyperlipidemia, unspecified: Secondary | ICD-10-CM | POA: Diagnosis not present

## 2022-03-09 DIAGNOSIS — R32 Unspecified urinary incontinence: Secondary | ICD-10-CM | POA: Diagnosis not present

## 2022-03-09 DIAGNOSIS — M810 Age-related osteoporosis without current pathological fracture: Secondary | ICD-10-CM | POA: Diagnosis not present

## 2022-03-09 DIAGNOSIS — Z7983 Long term (current) use of bisphosphonates: Secondary | ICD-10-CM | POA: Diagnosis not present

## 2022-03-09 DIAGNOSIS — D6869 Other thrombophilia: Secondary | ICD-10-CM | POA: Diagnosis not present

## 2022-03-09 DIAGNOSIS — Z885 Allergy status to narcotic agent status: Secondary | ICD-10-CM | POA: Diagnosis not present

## 2022-03-09 DIAGNOSIS — J45909 Unspecified asthma, uncomplicated: Secondary | ICD-10-CM | POA: Diagnosis not present

## 2022-03-09 DIAGNOSIS — E876 Hypokalemia: Secondary | ICD-10-CM | POA: Diagnosis not present

## 2022-03-09 DIAGNOSIS — Z9981 Dependence on supplemental oxygen: Secondary | ICD-10-CM | POA: Diagnosis not present

## 2022-03-09 DIAGNOSIS — E669 Obesity, unspecified: Secondary | ICD-10-CM | POA: Diagnosis not present

## 2022-03-09 DIAGNOSIS — I4891 Unspecified atrial fibrillation: Secondary | ICD-10-CM | POA: Diagnosis not present

## 2022-03-09 DIAGNOSIS — Z6834 Body mass index (BMI) 34.0-34.9, adult: Secondary | ICD-10-CM | POA: Diagnosis not present

## 2022-03-09 DIAGNOSIS — I110 Hypertensive heart disease with heart failure: Secondary | ICD-10-CM | POA: Diagnosis not present

## 2022-03-09 DIAGNOSIS — F419 Anxiety disorder, unspecified: Secondary | ICD-10-CM | POA: Diagnosis not present

## 2022-03-09 DIAGNOSIS — K219 Gastro-esophageal reflux disease without esophagitis: Secondary | ICD-10-CM | POA: Diagnosis not present

## 2022-03-09 DIAGNOSIS — Z7901 Long term (current) use of anticoagulants: Secondary | ICD-10-CM | POA: Diagnosis not present

## 2022-03-12 DIAGNOSIS — Z20822 Contact with and (suspected) exposure to covid-19: Secondary | ICD-10-CM | POA: Diagnosis not present

## 2022-03-12 DIAGNOSIS — R519 Headache, unspecified: Secondary | ICD-10-CM | POA: Diagnosis not present

## 2022-03-12 DIAGNOSIS — R5383 Other fatigue: Secondary | ICD-10-CM | POA: Diagnosis not present

## 2022-03-12 DIAGNOSIS — R059 Cough, unspecified: Secondary | ICD-10-CM | POA: Diagnosis not present

## 2022-03-13 DIAGNOSIS — Z20822 Contact with and (suspected) exposure to covid-19: Secondary | ICD-10-CM | POA: Diagnosis not present

## 2022-03-13 DIAGNOSIS — R519 Headache, unspecified: Secondary | ICD-10-CM | POA: Diagnosis not present

## 2022-03-13 DIAGNOSIS — R059 Cough, unspecified: Secondary | ICD-10-CM | POA: Diagnosis not present

## 2022-03-13 DIAGNOSIS — R5383 Other fatigue: Secondary | ICD-10-CM | POA: Diagnosis not present

## 2022-03-14 DIAGNOSIS — M6281 Muscle weakness (generalized): Secondary | ICD-10-CM | POA: Diagnosis not present

## 2022-03-14 DIAGNOSIS — R278 Other lack of coordination: Secondary | ICD-10-CM | POA: Diagnosis not present

## 2022-03-14 DIAGNOSIS — R011 Cardiac murmur, unspecified: Secondary | ICD-10-CM | POA: Diagnosis not present

## 2022-03-21 ENCOUNTER — Other Ambulatory Visit: Payer: Self-pay | Admitting: Nurse Practitioner

## 2022-03-21 DIAGNOSIS — R278 Other lack of coordination: Secondary | ICD-10-CM | POA: Diagnosis not present

## 2022-03-21 DIAGNOSIS — M6281 Muscle weakness (generalized): Secondary | ICD-10-CM | POA: Diagnosis not present

## 2022-03-21 DIAGNOSIS — R0981 Nasal congestion: Secondary | ICD-10-CM | POA: Diagnosis not present

## 2022-03-21 DIAGNOSIS — F419 Anxiety disorder, unspecified: Secondary | ICD-10-CM | POA: Diagnosis not present

## 2022-03-21 DIAGNOSIS — Z79899 Other long term (current) drug therapy: Secondary | ICD-10-CM | POA: Diagnosis not present

## 2022-03-21 DIAGNOSIS — R058 Other specified cough: Secondary | ICD-10-CM | POA: Diagnosis not present

## 2022-03-24 DIAGNOSIS — R221 Localized swelling, mass and lump, neck: Secondary | ICD-10-CM | POA: Diagnosis not present

## 2022-03-24 DIAGNOSIS — E042 Nontoxic multinodular goiter: Secondary | ICD-10-CM | POA: Diagnosis not present

## 2022-03-27 DIAGNOSIS — R2231 Localized swelling, mass and lump, right upper limb: Secondary | ICD-10-CM | POA: Diagnosis not present

## 2022-03-29 ENCOUNTER — Telehealth (HOSPITAL_BASED_OUTPATIENT_CLINIC_OR_DEPARTMENT_OTHER): Payer: Self-pay

## 2022-03-29 DIAGNOSIS — I70223 Atherosclerosis of native arteries of extremities with rest pain, bilateral legs: Secondary | ICD-10-CM | POA: Diagnosis not present

## 2022-03-29 NOTE — Telephone Encounter (Signed)
    Primary Cardiologist: Pixie Casino, MD  Chart reviewed as part of pre-operative protocol coverage. Simple dental extractions are considered low risk procedures per guidelines and generally do not require any specific cardiac clearance. It is also generally accepted that for simple extractions and dental cleanings, there is no need to interrupt blood thinner therapy.   SBE prophylaxis is not required for the patient.  I will route this recommendation to the requesting party via Epic fax function and remove from pre-op pool.  Please call with questions.  Deberah Pelton, NP 03/29/2022, 11:23 AM

## 2022-03-29 NOTE — Telephone Encounter (Signed)
   Pre-operative Risk Assessment    [name]  DOB: [birth year] MRN: [id number]     Request for Surgical Clearance    Procedure:  Dental Extraction - Amount of Teeth to be Pulled:  2  Date of Surgery:  Clearance 03/31/22                                 Surgeon:  Michael Litter, DMD Surgeon's Group or Practice Name:  [facility] Phone number:  [phone] Fax number:  [phone]   Type of Clearance Requested:   - Pharmacy:  Hold Apixaban (Eliquis) 2 days for post-op bleeding concerns   Type of Anesthesia:   Not specified   Additional requests/questions:   None  [name]   03/29/2022, 8:19 AM

## 2022-03-30 DIAGNOSIS — I10 Essential (primary) hypertension: Secondary | ICD-10-CM | POA: Diagnosis not present

## 2022-03-30 DIAGNOSIS — M810 Age-related osteoporosis without current pathological fracture: Secondary | ICD-10-CM | POA: Diagnosis not present

## 2022-03-30 DIAGNOSIS — I509 Heart failure, unspecified: Secondary | ICD-10-CM | POA: Diagnosis not present

## 2022-03-30 DIAGNOSIS — K219 Gastro-esophageal reflux disease without esophagitis: Secondary | ICD-10-CM | POA: Diagnosis not present

## 2022-03-30 DIAGNOSIS — I4891 Unspecified atrial fibrillation: Secondary | ICD-10-CM | POA: Diagnosis not present

## 2022-03-30 DIAGNOSIS — J449 Chronic obstructive pulmonary disease, unspecified: Secondary | ICD-10-CM | POA: Diagnosis not present

## 2022-03-30 DIAGNOSIS — E785 Hyperlipidemia, unspecified: Secondary | ICD-10-CM | POA: Diagnosis not present

## 2022-03-31 DIAGNOSIS — R590 Localized enlarged lymph nodes: Secondary | ICD-10-CM | POA: Diagnosis not present

## 2022-04-01 DIAGNOSIS — R059 Cough, unspecified: Secondary | ICD-10-CM | POA: Diagnosis not present

## 2022-04-01 DIAGNOSIS — R519 Headache, unspecified: Secondary | ICD-10-CM | POA: Diagnosis not present

## 2022-04-01 DIAGNOSIS — Z20822 Contact with and (suspected) exposure to covid-19: Secondary | ICD-10-CM | POA: Diagnosis not present

## 2022-04-01 DIAGNOSIS — R5383 Other fatigue: Secondary | ICD-10-CM | POA: Diagnosis not present

## 2022-04-03 DIAGNOSIS — Z20822 Contact with and (suspected) exposure to covid-19: Secondary | ICD-10-CM | POA: Diagnosis not present

## 2022-04-03 DIAGNOSIS — R5383 Other fatigue: Secondary | ICD-10-CM | POA: Diagnosis not present

## 2022-04-03 DIAGNOSIS — I77811 Abdominal aortic ectasia: Secondary | ICD-10-CM | POA: Diagnosis not present

## 2022-04-03 DIAGNOSIS — R519 Headache, unspecified: Secondary | ICD-10-CM | POA: Diagnosis not present

## 2022-04-03 DIAGNOSIS — R059 Cough, unspecified: Secondary | ICD-10-CM | POA: Diagnosis not present

## 2022-04-05 DIAGNOSIS — R519 Headache, unspecified: Secondary | ICD-10-CM | POA: Diagnosis not present

## 2022-04-05 DIAGNOSIS — R0989 Other specified symptoms and signs involving the circulatory and respiratory systems: Secondary | ICD-10-CM | POA: Diagnosis not present

## 2022-04-05 DIAGNOSIS — I6523 Occlusion and stenosis of bilateral carotid arteries: Secondary | ICD-10-CM | POA: Diagnosis not present

## 2022-04-05 DIAGNOSIS — R059 Cough, unspecified: Secondary | ICD-10-CM | POA: Diagnosis not present

## 2022-04-05 DIAGNOSIS — Z20822 Contact with and (suspected) exposure to covid-19: Secondary | ICD-10-CM | POA: Diagnosis not present

## 2022-04-05 DIAGNOSIS — R5383 Other fatigue: Secondary | ICD-10-CM | POA: Diagnosis not present

## 2022-04-06 DIAGNOSIS — R5383 Other fatigue: Secondary | ICD-10-CM | POA: Diagnosis not present

## 2022-04-06 DIAGNOSIS — R059 Cough, unspecified: Secondary | ICD-10-CM | POA: Diagnosis not present

## 2022-04-06 DIAGNOSIS — R519 Headache, unspecified: Secondary | ICD-10-CM | POA: Diagnosis not present

## 2022-04-06 DIAGNOSIS — Z20822 Contact with and (suspected) exposure to covid-19: Secondary | ICD-10-CM | POA: Diagnosis not present

## 2022-04-07 DIAGNOSIS — R5383 Other fatigue: Secondary | ICD-10-CM | POA: Diagnosis not present

## 2022-04-07 DIAGNOSIS — R059 Cough, unspecified: Secondary | ICD-10-CM | POA: Diagnosis not present

## 2022-04-07 DIAGNOSIS — R519 Headache, unspecified: Secondary | ICD-10-CM | POA: Diagnosis not present

## 2022-04-07 DIAGNOSIS — Z20822 Contact with and (suspected) exposure to covid-19: Secondary | ICD-10-CM | POA: Diagnosis not present

## 2022-04-09 DIAGNOSIS — E119 Type 2 diabetes mellitus without complications: Secondary | ICD-10-CM | POA: Diagnosis not present

## 2022-04-09 DIAGNOSIS — I10 Essential (primary) hypertension: Secondary | ICD-10-CM | POA: Diagnosis not present

## 2022-04-09 DIAGNOSIS — D518 Other vitamin B12 deficiency anemias: Secondary | ICD-10-CM | POA: Diagnosis not present

## 2022-04-09 DIAGNOSIS — I4891 Unspecified atrial fibrillation: Secondary | ICD-10-CM | POA: Diagnosis not present

## 2022-04-09 DIAGNOSIS — F419 Anxiety disorder, unspecified: Secondary | ICD-10-CM | POA: Diagnosis not present

## 2022-04-09 DIAGNOSIS — E785 Hyperlipidemia, unspecified: Secondary | ICD-10-CM | POA: Diagnosis not present

## 2022-04-09 DIAGNOSIS — M810 Age-related osteoporosis without current pathological fracture: Secondary | ICD-10-CM | POA: Diagnosis not present

## 2022-04-09 DIAGNOSIS — I509 Heart failure, unspecified: Secondary | ICD-10-CM | POA: Diagnosis not present

## 2022-04-09 DIAGNOSIS — J449 Chronic obstructive pulmonary disease, unspecified: Secondary | ICD-10-CM | POA: Diagnosis not present

## 2022-04-10 DIAGNOSIS — R5383 Other fatigue: Secondary | ICD-10-CM | POA: Diagnosis not present

## 2022-04-10 DIAGNOSIS — R059 Cough, unspecified: Secondary | ICD-10-CM | POA: Diagnosis not present

## 2022-04-10 DIAGNOSIS — Z20822 Contact with and (suspected) exposure to covid-19: Secondary | ICD-10-CM | POA: Diagnosis not present

## 2022-04-10 DIAGNOSIS — R519 Headache, unspecified: Secondary | ICD-10-CM | POA: Diagnosis not present

## 2022-04-11 ENCOUNTER — Inpatient Hospital Stay: Payer: Medicare HMO

## 2022-04-11 ENCOUNTER — Inpatient Hospital Stay: Payer: Medicare HMO | Admitting: Nurse Practitioner

## 2022-04-12 DIAGNOSIS — J449 Chronic obstructive pulmonary disease, unspecified: Secondary | ICD-10-CM | POA: Diagnosis not present

## 2022-04-13 IMAGING — CT CT ANGIO CHEST
2 of 6 series · 18 of 36 positions shown · IV contrast (omnipaque)
Comparison: 08/02/2020

CLINICAL DATA: AFib cardioversion yesterday. Shortness of breath.
Pulmonary embolus suspected.

EXAM:
CT ANGIOGRAPHY CHEST WITH CONTRAST
TECHNIQUE: Multidetector CT imaging of the chest was performed using the
standard protocol during bolus administration of intravenous
contrast. Multiplanar CT image reconstructions and MIPs were
obtained to evaluate the vascular anatomy.
CONTRAST:  80mL OMNIPAQUE IOHEXOL 350 MG/ML SOLN

[Series 7: pe thins · axial · 0.64mm/px · z∈[+1180,+1407]mm · 17 of 361 slices shown]
[im 19/361  lung]
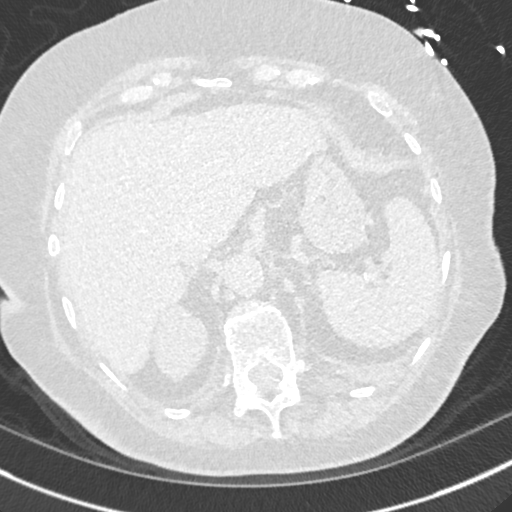
[im 37/361  mediastinal]
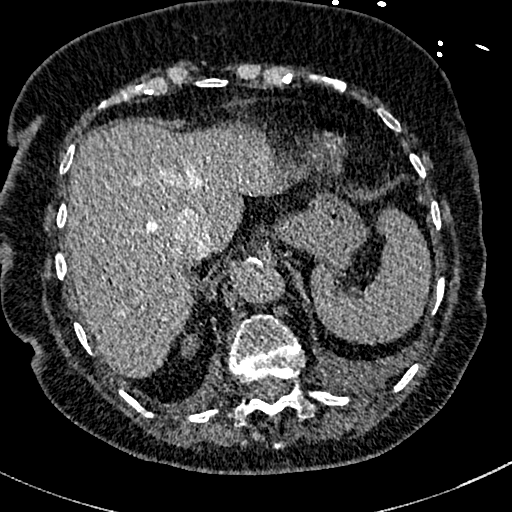
[im 55/361  lung]
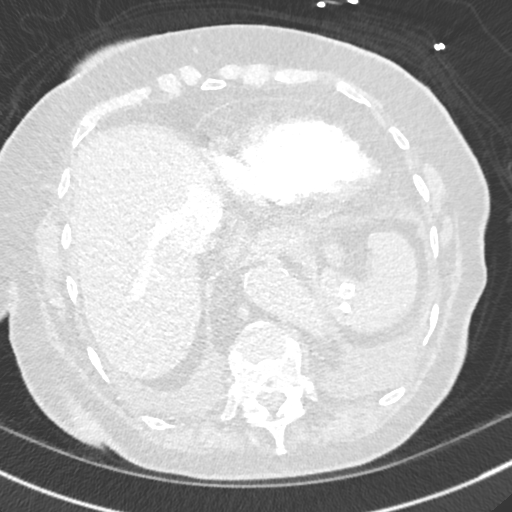
[im 73/361  mediastinal]
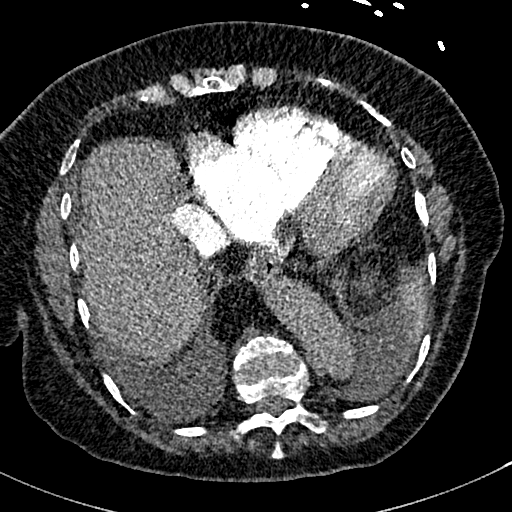
[im 109/361  lung]
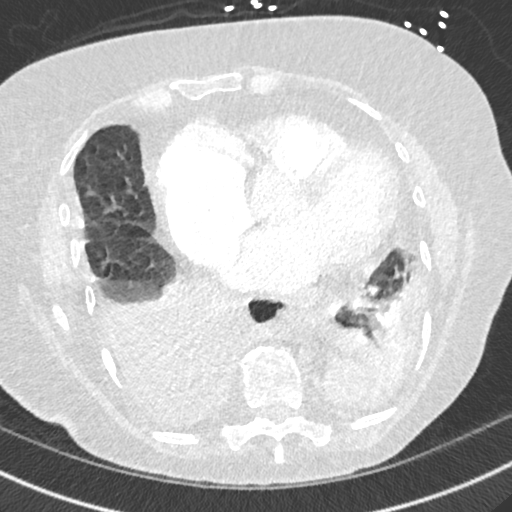
[im 127/361  mediastinal]
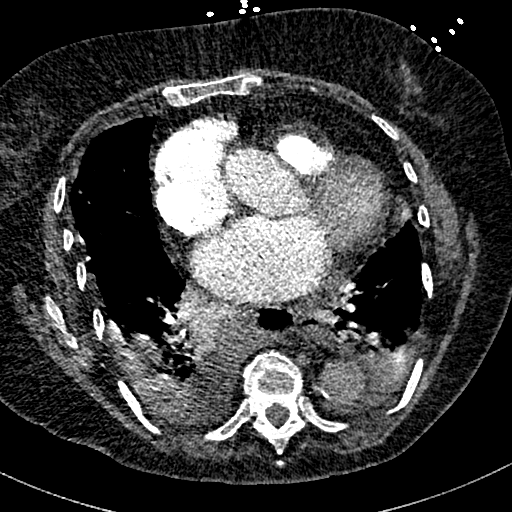
[im 145/361  lung]
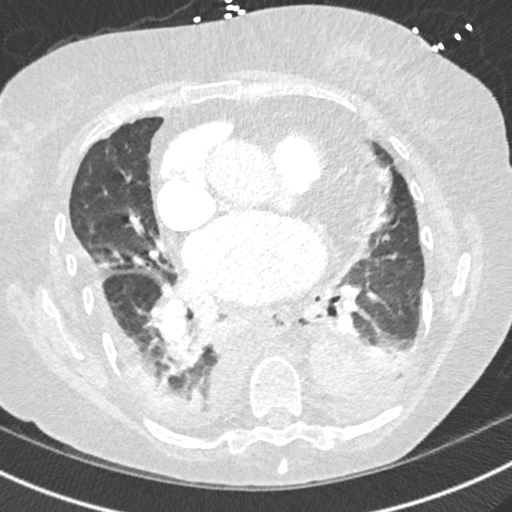
[im 163/361  mediastinal]
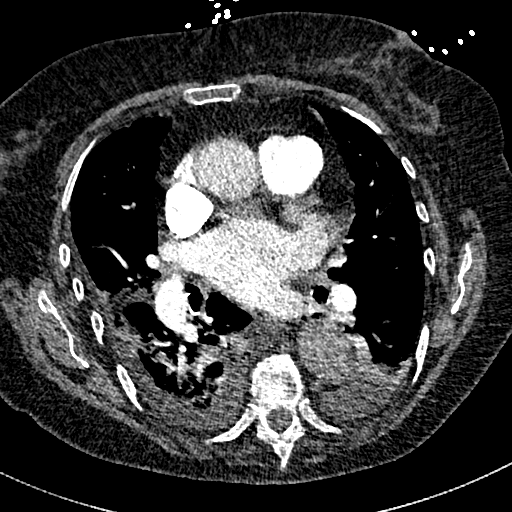
[im 181/361  lung]
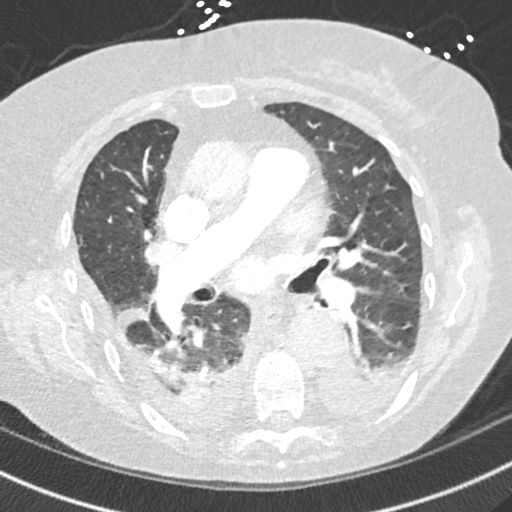
[im 199/361  mediastinal]
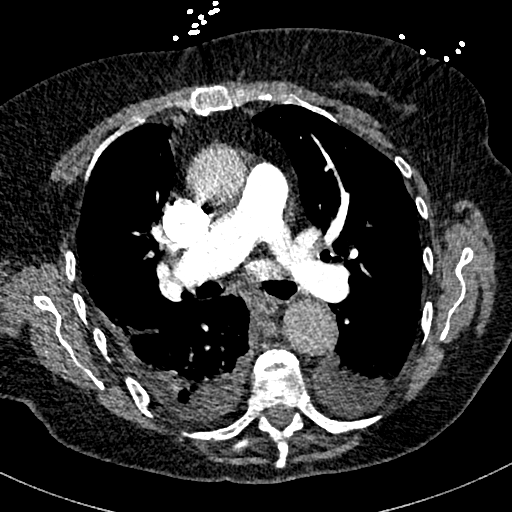
[im 217/361  lung]
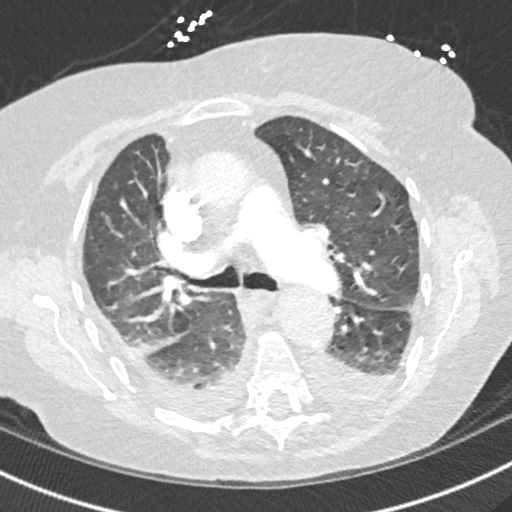
[im 235/361  mediastinal]
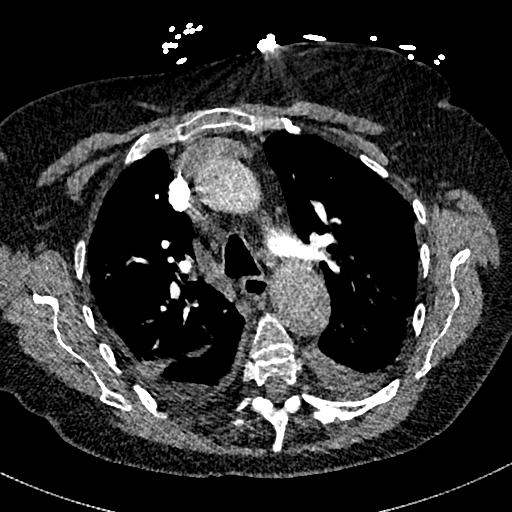
[im 253/361  lung]
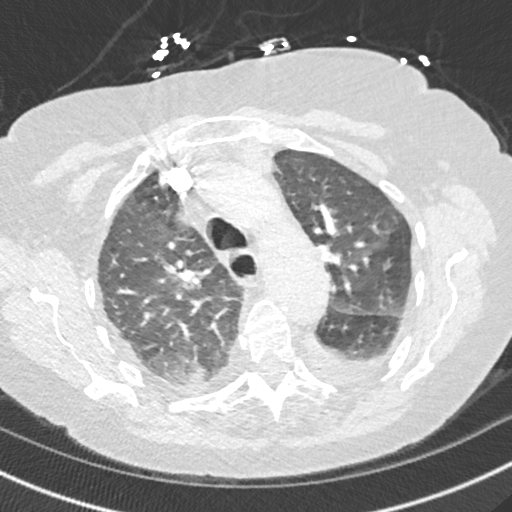
[im 289/361  mediastinal]
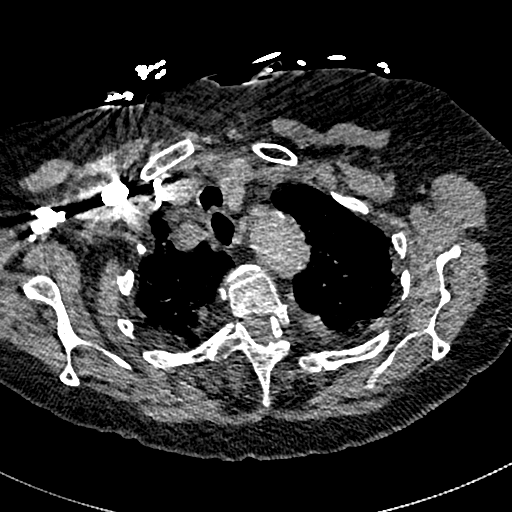
[im 307/361  lung]
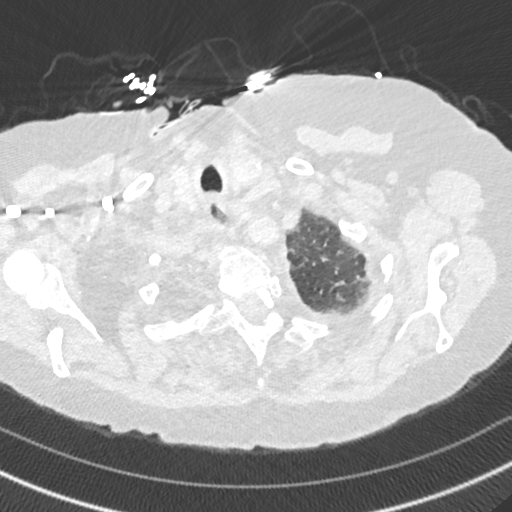
[im 325/361  mediastinal]
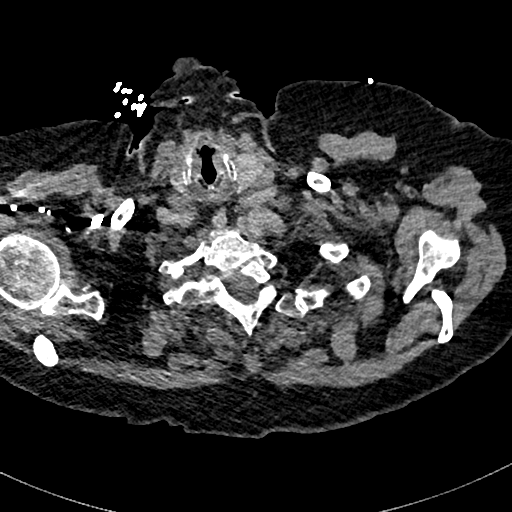
[im 343/361  lung]
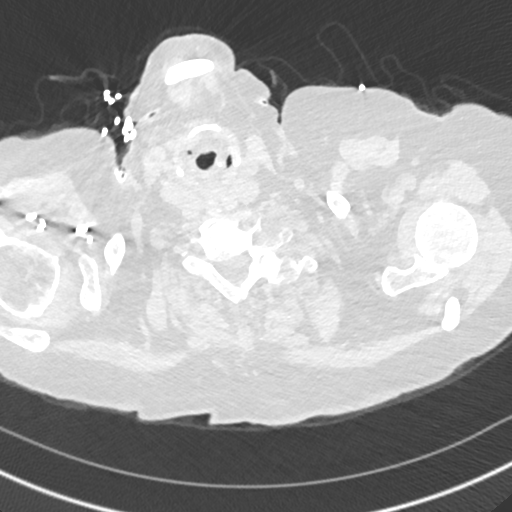

[Series 8: pe 2mm cor · coronal · 0.52mm/px · 1 of 138 slices shown]
[im 69/138  mediastinal]
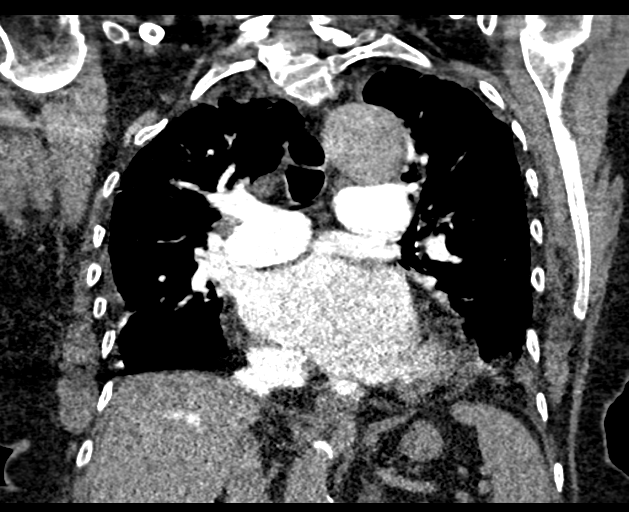

[18 of 36 positions shown; findings below may reference images not displayed]

FINDINGS: Cardiovascular: Heart is enlarged. Mild atherosclerotic
calcification is noted in the wall of the thoracic aorta. No large
central pulmonary embolus. Peripheral segmental and subsegmental
pulmonary arteries are not well evaluated, specially in the lower
lobes due to collapse/consolidation with superimposed motion
artifact.

Mediastinum/Nodes: No mediastinal lymphadenopathy. There is no hilar
lymphadenopathy. Esophagus mildly patulous There is no axillary
lymphadenopathy.

Lungs/Pleura: Diffuse ground-glass opacities seen in both lungs with
bibasilar collapse/consolidative disease and small bilateral pleural
effusions

Upper Abdomen: Benign-appearing calcified lesions in the spleen may
be granulomatous.

Musculoskeletal: No worrisome lytic or sclerotic osseous
abnormality. Multilevel thoracic compression deformity is stable in
the interval.

Review of the MIP images confirms the above findings.
IMPRESSION: 1. No large central pulmonary embolus. Peripheral segmental and
subsegmental pulmonary arteries are not well evaluated due to
collapse/consolidative disease with superimposed motion artifact.
2. Diffuse ground-glass opacities in both lungs, potentially related
to pulmonary edema.
3. Bibasilar collapse/consolidative disease and small bilateral
pleural effusions. Pneumonia not excluded.
4. Aortic Atherosclerosis ([id number].W).

## 2022-04-14 DIAGNOSIS — R059 Cough, unspecified: Secondary | ICD-10-CM | POA: Diagnosis not present

## 2022-04-14 DIAGNOSIS — Z20822 Contact with and (suspected) exposure to covid-19: Secondary | ICD-10-CM | POA: Diagnosis not present

## 2022-04-14 DIAGNOSIS — R519 Headache, unspecified: Secondary | ICD-10-CM | POA: Diagnosis not present

## 2022-04-14 DIAGNOSIS — R5383 Other fatigue: Secondary | ICD-10-CM | POA: Diagnosis not present

## 2022-04-16 DIAGNOSIS — Z20822 Contact with and (suspected) exposure to covid-19: Secondary | ICD-10-CM | POA: Diagnosis not present

## 2022-04-16 DIAGNOSIS — R519 Headache, unspecified: Secondary | ICD-10-CM | POA: Diagnosis not present

## 2022-04-16 DIAGNOSIS — R059 Cough, unspecified: Secondary | ICD-10-CM | POA: Diagnosis not present

## 2022-04-16 DIAGNOSIS — R5383 Other fatigue: Secondary | ICD-10-CM | POA: Diagnosis not present

## 2022-05-04 ENCOUNTER — Inpatient Hospital Stay: Payer: Medicare HMO | Admitting: Nurse Practitioner

## 2022-05-04 ENCOUNTER — Inpatient Hospital Stay: Payer: Medicare HMO

## 2022-05-10 DIAGNOSIS — F419 Anxiety disorder, unspecified: Secondary | ICD-10-CM | POA: Diagnosis not present

## 2022-05-10 DIAGNOSIS — I4891 Unspecified atrial fibrillation: Secondary | ICD-10-CM | POA: Diagnosis not present

## 2022-05-10 DIAGNOSIS — J449 Chronic obstructive pulmonary disease, unspecified: Secondary | ICD-10-CM | POA: Diagnosis not present

## 2022-05-10 DIAGNOSIS — D518 Other vitamin B12 deficiency anemias: Secondary | ICD-10-CM | POA: Diagnosis not present

## 2022-05-10 DIAGNOSIS — I509 Heart failure, unspecified: Secondary | ICD-10-CM | POA: Diagnosis not present

## 2022-05-10 DIAGNOSIS — I10 Essential (primary) hypertension: Secondary | ICD-10-CM | POA: Diagnosis not present

## 2022-05-10 DIAGNOSIS — M810 Age-related osteoporosis without current pathological fracture: Secondary | ICD-10-CM | POA: Diagnosis not present

## 2022-05-10 DIAGNOSIS — E785 Hyperlipidemia, unspecified: Secondary | ICD-10-CM | POA: Diagnosis not present

## 2022-05-10 DIAGNOSIS — E119 Type 2 diabetes mellitus without complications: Secondary | ICD-10-CM | POA: Diagnosis not present

## 2022-05-11 DIAGNOSIS — H6521 Chronic serous otitis media, right ear: Secondary | ICD-10-CM | POA: Diagnosis not present

## 2022-05-11 DIAGNOSIS — H9193 Unspecified hearing loss, bilateral: Secondary | ICD-10-CM | POA: Diagnosis not present

## 2022-05-11 DIAGNOSIS — K219 Gastro-esophageal reflux disease without esophagitis: Secondary | ICD-10-CM | POA: Diagnosis not present

## 2022-05-11 DIAGNOSIS — R1310 Dysphagia, unspecified: Secondary | ICD-10-CM | POA: Diagnosis not present

## 2022-05-12 ENCOUNTER — Other Ambulatory Visit (HOSPITAL_COMMUNITY): Payer: Self-pay | Admitting: Physician Assistant

## 2022-05-12 DIAGNOSIS — R1310 Dysphagia, unspecified: Secondary | ICD-10-CM

## 2022-05-15 ENCOUNTER — Other Ambulatory Visit: Payer: Self-pay | Admitting: Internal Medicine

## 2022-05-15 DIAGNOSIS — I480 Paroxysmal atrial fibrillation: Secondary | ICD-10-CM

## 2022-05-15 DIAGNOSIS — J441 Chronic obstructive pulmonary disease with (acute) exacerbation: Secondary | ICD-10-CM | POA: Diagnosis not present

## 2022-05-18 ENCOUNTER — Encounter (HOSPITAL_BASED_OUTPATIENT_CLINIC_OR_DEPARTMENT_OTHER): Payer: Medicare HMO

## 2022-05-21 ENCOUNTER — Emergency Department (HOSPITAL_BASED_OUTPATIENT_CLINIC_OR_DEPARTMENT_OTHER)
Admission: EM | Admit: 2022-05-21 | Discharge: 2022-05-21 | Disposition: A | Discharge status: Home / Self Care | Payer: Medicare HMO | Attending: Emergency Medicine | Admitting: Emergency Medicine

## 2022-05-21 ENCOUNTER — Encounter (HOSPITAL_BASED_OUTPATIENT_CLINIC_OR_DEPARTMENT_OTHER): Payer: Self-pay

## 2022-05-21 ENCOUNTER — Other Ambulatory Visit: Payer: Self-pay

## 2022-05-21 ENCOUNTER — Emergency Department (HOSPITAL_BASED_OUTPATIENT_CLINIC_OR_DEPARTMENT_OTHER): Payer: Medicare HMO | Admitting: Radiology

## 2022-05-21 ENCOUNTER — Emergency Department (HOSPITAL_BASED_OUTPATIENT_CLINIC_OR_DEPARTMENT_OTHER): Payer: Medicare HMO

## 2022-05-21 DIAGNOSIS — Z7901 Long term (current) use of anticoagulants: Secondary | ICD-10-CM | POA: Diagnosis not present

## 2022-05-21 DIAGNOSIS — R0600 Dyspnea, unspecified: Secondary | ICD-10-CM | POA: Insufficient documentation

## 2022-05-21 DIAGNOSIS — R079 Chest pain, unspecified: Secondary | ICD-10-CM | POA: Diagnosis not present

## 2022-05-21 DIAGNOSIS — R0602 Shortness of breath: Secondary | ICD-10-CM | POA: Diagnosis not present

## 2022-05-21 DIAGNOSIS — R1310 Dysphagia, unspecified: Secondary | ICD-10-CM | POA: Diagnosis not present

## 2022-05-21 DIAGNOSIS — Z79899 Other long term (current) drug therapy: Secondary | ICD-10-CM | POA: Diagnosis not present

## 2022-05-21 DIAGNOSIS — I110 Hypertensive heart disease with heart failure: Secondary | ICD-10-CM | POA: Diagnosis not present

## 2022-05-21 DIAGNOSIS — I5031 Acute diastolic (congestive) heart failure: Secondary | ICD-10-CM | POA: Diagnosis not present

## 2022-05-21 DIAGNOSIS — J45909 Unspecified asthma, uncomplicated: Secondary | ICD-10-CM | POA: Diagnosis not present

## 2022-05-21 DIAGNOSIS — J9811 Atelectasis: Secondary | ICD-10-CM | POA: Diagnosis not present

## 2022-05-21 DIAGNOSIS — J90 Pleural effusion, not elsewhere classified: Secondary | ICD-10-CM | POA: Diagnosis not present

## 2022-05-21 DIAGNOSIS — R059 Cough, unspecified: Secondary | ICD-10-CM | POA: Diagnosis not present

## 2022-05-21 LAB — COMPREHENSIVE METABOLIC PANEL
ALT: 16 U/L (ref 0–44)
AST: 16 U/L (ref 15–41)
Albumin: 4.1 g/dL (ref 3.5–5.0)
Alkaline Phosphatase: 80 U/L (ref 38–126)
Anion gap: 10 (ref 5–15)
BUN: 34 mg/dL — ABNORMAL HIGH (ref 8–23)
CO2: 37 mmol/L — ABNORMAL HIGH (ref 22–32)
Calcium: 9.2 mg/dL (ref 8.9–10.3)
Chloride: 97 mmol/L — ABNORMAL LOW (ref 98–111)
Creatinine, Ser: 1.05 mg/dL — ABNORMAL HIGH (ref 0.44–1.00)
GFR, Estimated: 53 mL/min — ABNORMAL LOW (ref >60)
Glucose, Bld: 85 mg/dL (ref 70–99)
Potassium: 3 mmol/L — ABNORMAL LOW (ref 3.5–5.1)
Sodium: 144 mmol/L (ref 135–145)
Total Bilirubin: 0.6 mg/dL (ref 0.3–1.2)
Total Protein: 7.3 g/dL (ref 6.5–8.1)

## 2022-05-21 LAB — CBC WITH DIFFERENTIAL/PLATELET
Abs Immature Granulocytes: 0.09 10*3/uL — ABNORMAL HIGH (ref 0.00–0.07)
Basophils Absolute: 0 10*3/uL (ref 0.0–0.1)
Basophils Relative: 0 %
Eosinophils Absolute: 0 10*3/uL (ref 0.0–0.5)
Eosinophils Relative: 0 %
HCT: 43.6 % (ref 36.0–46.0)
Hemoglobin: 14.1 g/dL (ref 12.0–15.0)
Immature Granulocytes: 1 %
Lymphocytes Relative: 11 %
Lymphs Abs: 1.4 10*3/uL (ref 0.7–4.0)
MCH: 29.8 pg (ref 26.0–34.0)
MCHC: 32.3 g/dL (ref 30.0–36.0)
MCV: 92.2 fL (ref 80.0–100.0)
Monocytes Absolute: 0.8 10*3/uL (ref 0.1–1.0)
Monocytes Relative: 7 %
Neutro Abs: 9.5 10*3/uL — ABNORMAL HIGH (ref 1.7–7.7)
Neutrophils Relative %: 81 %
Platelets: 314 10*3/uL (ref 150–400)
RBC: 4.73 MIL/uL (ref 3.87–5.11)
RDW: 14.5 % (ref 11.5–15.5)
WBC: 11.8 10*3/uL — ABNORMAL HIGH (ref 4.0–10.5)
nRBC: 0 % (ref 0.0–0.2)

## 2022-05-21 LAB — TROPONIN I (HIGH SENSITIVITY): Troponin I (High Sensitivity): 10 ng/L (ref <18)

## 2022-05-21 LAB — BRAIN NATRIURETIC PEPTIDE: B Natriuretic Peptide: 150.1 pg/mL — ABNORMAL HIGH (ref 0.0–100.0)

## 2022-05-21 MED ORDER — SUCRALFATE 1 G PO TABS: 1.0000 g | ORAL_TABLET | Freq: Three times a day (TID) | ORAL | 0 refills | Status: DC

## 2022-05-21 MED ORDER — IOHEXOL 350 MG/ML SOLN
100.0000 mL | Freq: Once | INTRAVENOUS | Status: AC | PRN
  Administered 2022-05-21: 80 mL via INTRAVENOUS

## 2022-05-21 NOTE — Discharge Instructions (Addendum)
You can start taking Carafate with meals.  It is okay to pick this medicine up tomorrow and start whenever.

## 2022-05-21 NOTE — ED Triage Notes (Signed)
In for eval of "knot" feeling in chest with swallowing. Reports that anything that she drinks comes back up. Has had "wet" lungs for 3 weeks per nurse daughter. H/O Afib and CHF. Home O2. Recently finished prednisone and takes breathing txs Q6 hrs without relief. Saw ENT with scope no noted stricture. Swallow study scheduled for next week. Able to converse easily.

## 2022-05-21 NOTE — ED Notes (Signed)
Discharge paperwork given and verbally understood. 

## 2022-05-21 NOTE — ED Provider Notes (Signed)
Lindsay [name] EMERGENCY DEPARTMENT AT [facility] Provider Note   CSN: [id number] Arrival date & time: 05/21/22  1227     History  Chief Complaint  Patient presents with   Cough    Lindsay [name] is a 82 y.o. female.  HPI    82 year old female with a history of acute diastolic congestive heart failure, asthma, atrial fibrillation on Eliquis, hypertension, hyperlipidemia, chronic respiratory failure on 2 L of oxygen who presents with concern for 3 weeks of worsening cough, shortness of breath, also with episodes of dysphagia, and difficulty swallowing last night after having a muffin resulting in regurgitation throughout the night.    3 weeks ago, coughing, sounds wet, trying nebs, fluid pills, steroids. Orthopnea. Has had swelling in the legs that has been worse but not today-thinks diuretic has been helping.  Chest pain, felt like couldn't breath, pressure like pain, dull pain.  Not having it now,when eat or drink will cough, then with coughing has chest pain. Not exertional or positional pain.  Upper airway secretions for over a year, has trouble coughing up stuff. Ordered swallow test, scoped her in the office.  Secretions never better.    Last night began to have difficulty swallowing fluids Worse night in life, felt short of breath Today would have been last prednisone pill Last week coughing up green sputum, yesterday coughed up bloody sputum for first time  After having muffin last night, felt like it was getting stuck, chewed it well but felt like it was stuck.  Does not have gastroenterologist here, lived here for 2 years  ON 2l  Past Medical History:  Diagnosis Date   Acute diastolic CHF (congestive heart failure) (HCC) 08/03/2020   Acute non-recurrent pansinusitis 10/21/2020   Acute on chronic respiratory failure with hypoxia (HCC) 08/02/2020   Asthma    Atrial fibrillation (HCC)    Dysphagia 08/02/2020   Elevated troponin 09/14/2020   High cholesterol    HTN  (hypertension)    Hyperkalemia 09/14/2020   Hyponatremia 08/02/2020   Low grade squamous intraepithelial lesion on cytologic smear of cervix (LGSIL) 12/02/2019   Respiratory failure with hypoxia (HCC)     Home Medications Prior to Admission medications   Medication Sig Start Date End Date Taking? Authorizing Provider  albuterol (PROVENTIL) (5 MG/ML) 0.5% nebulizer solution Take 0.5 mLs by nebulization every 6 (six) hours as needed for wheezing or shortness of breath.    [provider]  alendronate (FOSAMAX) 70 MG tablet TAKE 1 TABLET BY MOUTH EVERY SUNDAY 01/06/22   Early, Sung Amabile, NP  apixaban (ELIQUIS) 5 MG TABS tablet TAKE 1 TABLET BY MOUTH 2 TIMES DAILY 05/15/22   Alver Sorrow, NP  atorvastatin (LIPITOR) 20 MG tablet TAKE 1 TABLET BY MOUTH EVERY EVENING 01/06/22   Early, Sung Amabile, NP  benzonatate (TESSALON) 200 MG capsule Take 1 capsule (200 mg total) by mouth 3 (three) times daily as needed for cough. 02/04/21   Tollie Eth, NP  Calcium-Magnesium-Vitamin D (CALCIUM 1200+D3 PO) Take 1 tablet by mouth in the morning.    [provider]  cetirizine (ZYRTEC) 10 MG tablet TAKE 1 TABLET BY MOUTH EVERY DAY 02/06/22   Martina Sinner, MD  Elastic Bandages & Supports (MEDICAL COMPRESSION STOCKINGS) MISC Two pair knee high 30-50mmHg compression stockings. Sized XL. As covered by insurance for ICD-10: I50.33, R60.0. 03/09/21   Early, Sung Amabile, NP  Fluticasone-Umeclidin-Vilant (TRELEGY ELLIPTA) 100-62.5-25 MCG/ACT AEPB Inhale 1 puff into the lungs daily. 12/20/21  Martina Sinner, MD  guaiFENesin (MUCINEX) 600 MG 12 hr tablet TAKE 1 TABLET BY MOUTH 2 TIMES DAILY 03/22/22   Cobb, Ruby Cola, NP  ipratropium-albuterol (DUONEB) 0.5-2.5 (3) MG/3ML SOLN Inhale 3 mLs into the lungs every 6 (six) hours as needed (wheezing). 05/09/21   Martina Sinner, MD  LORazepam (ATIVAN) 1 MG tablet Take 1 tablet (1 mg total) by mouth 2 (two) times daily as needed for anxiety. 02/27/22   Tollie Eth,  NP  Menthol (COUGH DROPS) 5.8 MG LOZG Use as directed 1 lozenge (5.8 mg total) in the mouth or throat as needed (for dry, sore, or scratchy throat). 11/16/20   Tollie Eth, NP  metoprolol tartrate (LOPRESSOR) 25 MG tablet Take 25 mg by mouth 2 (two) times daily. 12/26/21   [provider]  mirtazapine (REMERON) 45 MG tablet TAKE 1 TABLET BY MOUTH AT BEDTIME 10/27/21   Early, Sung Amabile, NP  Multiple Vitamin (MULTIVITAMIN WITH MINERALS) TABS tablet Take 1 tablet by mouth in the morning.    [provider]  OXYGEN Inhale 2 L into the lungs daily as needed (shortness of breath or activity level).    [provider]  pantoprazole (PROTONIX) 20 MG tablet TAKE 1 TABLET BY MOUTH EVERY DAY 06/07/21   Early, Sung Amabile, NP  Phenazopyridine HCl 99.5 MG TABS Take 2 tablets by mouth 3 (three) times daily as needed (urinary pain).    [provider]  potassium chloride 20 MEQ TBCR Take 20 mEq by mouth daily. 01/02/22 02/01/22  Alver Sorrow, NP  sertraline (ZOLOFT) 100 MG tablet TAKE 1 TABLET BY MOUTH AT BEDTIME 01/06/22   Early, Sung Amabile, NP  torsemide (DEMADEX) 20 MG tablet TAKE 2 TABLETS DAILY EVERY MORNING. TAKE AN ADDITIONAL 2 TABLETS AS NEEDED FOR WEIGHT GAIN OF 2 POUNDS OVERNIGHT OR 5 POUNDS IN ONE WEEK. 09/01/21   Early, Sung Amabile, NP      Allergies    Acetaminophen-codeine and Codeine    Review of Systems   Review of Systems  Physical Exam Updated Vital Signs BP 110/79 (BP Location: Right Arm)   Pulse 86   Temp 97.6 F (36.4 C) (Oral)   Resp 20   Ht 5\' 9"  (1.753 m)   Wt 106.1 kg   SpO2 93%   BMI 34.56 kg/m  Physical Exam Vitals and nursing note reviewed.  Constitutional:      General: She is not in acute distress.    Appearance: She is well-developed. She is not diaphoretic.  HENT:     Head: Normocephalic and atraumatic.  Eyes:     Conjunctiva/sclera: Conjunctivae normal.  Cardiovascular:     Rate and Rhythm: Normal rate and regular rhythm.     Heart  sounds: Normal heart sounds. No murmur heard.    No friction rub. No gallop.  Pulmonary:     Effort: Pulmonary effort is normal. No respiratory distress.     Breath sounds: Normal breath sounds. No wheezing or rales.  Abdominal:     General: There is no distension.     Palpations: Abdomen is soft.     Tenderness: There is no abdominal tenderness. There is no guarding.  Musculoskeletal:        General: No tenderness.     Cervical back: Normal range of motion.  Skin:    General: Skin is warm and dry.     Findings: No erythema or rash.  Neurological:     Mental Status: She is  alert and oriented to person, place, and time.     ED Results / Procedures / Treatments   Labs (all labs ordered are listed, but only abnormal results are displayed) Labs Reviewed  CBC WITH DIFFERENTIAL/PLATELET  COMPREHENSIVE METABOLIC PANEL  BRAIN NATRIURETIC PEPTIDE  TROPONIN I (HIGH SENSITIVITY)  TROPONIN I (HIGH SENSITIVITY)    EKG EKG Interpretation  Date/Time:  Sunday May 21 2022 14:59:26 EDT Ventricular Rate:  63 PR Interval:    QRS Duration: 85 QT Interval:  397 QTC Calculation: 407 R Axis:   -70 Text Interpretation: Atrial fibrillation Left anterior fascicular block Low voltage, precordial leads Consider right ventricular hypertrophy Nonspecific T abnormalities, inferior leads No significant change since last tracing Confirmed by Donathan Buller ([id number]) on 05/21/2022 3:06:23 PM  Radiology DG Chest 2 View  Result Date: 05/21/2022 CLINICAL DATA:  Chest pain and persistent cough. EXAM: CHEST - 2 VIEW COMPARISON:  December 07, 2021 FINDINGS: There is stable mild to moderate severity cardiac silhouette enlargement. Stable elevation of the left hemidiaphragm and mild left basilar scarring and/or atelectasis is noted. There is a very small left pleural effusion. No pneumothorax is identified. Chronic right sided rib fractures are seen. IMPRESSION: 1. Stable cardiomegaly with mild left basilar  atelectasis. 2. Very small left pleural effusion. Electronically Signed   By: Thaddeus  Houston M.D.   On: 05/21/2022 13:59    Procedures Procedures    Medications Ordered in ED Medications - No data to display  ED Course/ Medical Decision Making/ A&P                              81  year old female with a history of acute diastolic congestive heart failure, asthma, atrial fibrillation on Eliquis, hypertension, hyperlipidemia, chronic respiratory failure on 2 L of oxygen who presents with concern for 3 weeks of worsening cough, shortness of breath, also with episodes of dysphagia, and difficulty swallowing last night after having a muffin resulting in regurgitation throughout the night.    Presenting with both ongoing cough/dyspnea/chest pain, symptoms of aspiration, and dysphagia as well as acute on chronic difficulty swallowing last night.  Dysphagia:: Symptoms described last night do sound consistent with a food impaction after she had had a muffin, with continuous regurgitation after she had had liquids, however at this time those symptoms have resolved, she is able to tolerate p.o.  Do suspect some type of esophageal stricture given history, consider also esophageal fistula, achalasia.   Recommend GI follow up, soft or pureed diet, continue PPI.   Dyspnea:   Differential diagnosis for dyspnea includes ACS, PE, COPD exacerbation, CHF exacerbation, anemia, pneumonia, viral etiology such as COVID 19 infection, metabolic abnormality.    Chest x-ray was done which showed no significant change in comparison to prior.   EKG was evaluated by me which showed atrial fibrillation with known history of same.   Exam/history not clearly COPD althought did improve with steroids some and consider this, leg swelling improving iwht torsemide, however given continue dyspnea/chest pain/episode of hemoptysis ordered CT PE study to evaluate for this as well as occult pneumonia or other etiology of  worsening cough/dyspnea.    Signed out to Dr. Lockie Mola with labs and imaging pending.   Of note-also has small 1cm cystic lesion to scalp, no sign of infection/abscess, recommend outpt follow up with PCP.         Final Clinical Impression(s) / ED Diagnoses Final diagnoses:  Cough, unspecified type  Dyspnea, unspecified type  Dysphagia, unspecified type    Rx / DC Orders ED Discharge Orders     None         Alvira Monday, MD 05/21/22 1536

## 2022-05-21 NOTE — ED Provider Notes (Signed)
Patient here with chest pain.  Workup unremarkable.  Troponin normal.  BMP unremarkable.  PE scan showed no pneumonia or pulmonary embolism.  Overall patient having issues with swallowing and dysphagia symptoms.  Sounds like this is likely GI related.  She does not have food impaction.  Will have her take Carafate along with her PPI.  Will refer her to GI.  Discharged in good condition.  Recommend that she make sure she chews her food really well and make sure she is having soft diet and avoiding any things like steak.  This chart was dictated using voice recognition software.  Despite best efforts to proofread,  errors can occur which can change the documentation meaning.    Virgina Norfolk, DO 05/21/22 1746

## 2022-05-25 ENCOUNTER — Ambulatory Visit (HOSPITAL_COMMUNITY)
Admission: RE | Admit: 2022-05-25 | Discharge: 2022-05-25 | Disposition: A | Discharge status: Home / Self Care | Payer: Medicare HMO | Source: Ambulatory Visit | Attending: Physician Assistant | Admitting: Physician Assistant

## 2022-05-25 DIAGNOSIS — R1310 Dysphagia, unspecified: Secondary | ICD-10-CM | POA: Diagnosis not present

## 2022-05-25 DIAGNOSIS — K224 Dyskinesia of esophagus: Secondary | ICD-10-CM | POA: Diagnosis not present

## 2022-05-26 ENCOUNTER — Telehealth: Payer: Self-pay

## 2022-05-26 ENCOUNTER — Ambulatory Visit (INDEPENDENT_AMBULATORY_CARE_PROVIDER_SITE_OTHER): Payer: Medicare HMO | Admitting: Nurse Practitioner

## 2022-05-26 ENCOUNTER — Encounter: Payer: Self-pay | Admitting: Nurse Practitioner

## 2022-05-26 VITALS — BP 120/74 | HR 84 | Wt 236.2 lb

## 2022-05-26 DIAGNOSIS — I480 Paroxysmal atrial fibrillation: Secondary | ICD-10-CM | POA: Diagnosis not present

## 2022-05-26 DIAGNOSIS — I10 Essential (primary) hypertension: Secondary | ICD-10-CM

## 2022-05-26 DIAGNOSIS — R600 Localized edema: Secondary | ICD-10-CM | POA: Diagnosis not present

## 2022-05-26 DIAGNOSIS — R399 Unspecified symptoms and signs involving the genitourinary system: Secondary | ICD-10-CM

## 2022-05-26 DIAGNOSIS — L0291 Cutaneous abscess, unspecified: Secondary | ICD-10-CM

## 2022-05-26 DIAGNOSIS — J449 Chronic obstructive pulmonary disease, unspecified: Secondary | ICD-10-CM

## 2022-05-26 DIAGNOSIS — L723 Sebaceous cyst: Secondary | ICD-10-CM | POA: Diagnosis not present

## 2022-05-26 DIAGNOSIS — Z9981 Dependence on supplemental oxygen: Secondary | ICD-10-CM

## 2022-05-26 DIAGNOSIS — R052 Subacute cough: Secondary | ICD-10-CM

## 2022-05-26 DIAGNOSIS — I5032 Chronic diastolic (congestive) heart failure: Secondary | ICD-10-CM

## 2022-05-26 DIAGNOSIS — J9611 Chronic respiratory failure with hypoxia: Secondary | ICD-10-CM | POA: Diagnosis not present

## 2022-05-26 DIAGNOSIS — I4819 Other persistent atrial fibrillation: Secondary | ICD-10-CM

## 2022-05-26 MED ORDER — DOXYCYCLINE HYCLATE 100 MG PO TABS: 100.0000 mg | ORAL_TABLET | Freq: Two times a day (BID) | ORAL | 0 refills | Status: DC

## 2022-05-26 MED ORDER — FLUCONAZOLE 150 MG PO TABS: 150.0000 mg | ORAL_TABLET | Freq: Once | ORAL | 0 refills | Status: AC

## 2022-05-26 NOTE — Telephone Encounter (Signed)
Transition Care Management Follow-up Telephone Call Date of discharge and from where: 05/21/2022, [facility]. How have you been since you were released from the hospital? Patient is able to swallow a little easier. Any questions or concerns? No  Items Reviewed: Did the pt receive and understand the discharge instructions provided? Yes  Medications obtained and verified? Yes  Other? No  Any new allergies since your discharge? No  Dietary orders reviewed? Yes Do you have support at home? Yes    Follow up appointments reviewed:  PCP Hospital f/u appt confirmed? Yes  Scheduled to see Lindsay Skeens NP on 05/26/2022 @ [facility]. Specialist Hospital f/u appt confirmed? Yes  Scheduled to see Lindsay Hacker [name] on 05/25/2022 @ [facility] Rad[facility]. Are transportation arrangements needed?  Patient has submitted SCAT application but has not gotten a response. I will follow up and contact the patient's daughter Lindsay [name]. If their condition worsens, is the pt aware to call PCP or go to the Emergency Dept.? Yes Was the patient provided with contact information for the PCP's office or ED? Yes Was to pt encouraged to call back with questions or concerns? Yes   [name]  [facility] Population Health Community Resource Care Guide   ??[name]  ?? [phone]   Website: [email or web address]  [email or web address]

## 2022-05-26 NOTE — Patient Instructions (Signed)
It was so good to see you!!  I have sent the antibiotic to [facility] for you. If you are not feeling better by Monday I want you to let me know.

## 2022-05-26 NOTE — Progress Notes (Signed)
Lindsay Clamp, DNP, AGNP-c [facility]  [address] [place], [place] [address] [phone]  ESTABLISHED PATIENT- Chronic Health and/or Follow-Up Visit  Blood pressure 120/74, pulse 84, weight 236 lb 3.2 oz (107.1 kg), SpO2 90 %.    Lindsay [name] is a 82 y.o. year old female presenting today for evaluation and management of chronic conditions.   [name] presents today with chief complaints of a persistent cough and chest congestion for approximately three weeks. She has been taking Mucinex and Flonase to manage her symptoms, but reports that her breathing is okay, although the cough and congestion have not improved. [name] has been prescribed prednisone in the past, which helps while she is on it, but the symptoms return once she stops taking it.  [name] also reports possible urinary tract infection (UTI) symptoms, experiencing stinging during urination and frequent urges to urinate. She has a history of UTI symptoms.  [name] is currently taking Eliquis for anticoagulation therapy. The patient denies any new bruising or issues related to her anticoagulation therapy.  Additionally, [name] has an abscess on her head, which has been present for at least three weeks. She would like this removed today, if possible as it is tender to the touch. She denies any drainage from the area.   All ROS negative with exception of what is listed above.   PHYSICAL EXAM Physical Exam Vitals and nursing note reviewed.  Constitutional:      Appearance: She is obese.  HENT:     Head:     Comments: Appx 1.5 cm firm, round, cyst noted on the superior portion of the head, right of the midline. No erythema present. No punctum visible. Tenderness on palpation. Consistent with sebaceous cyst.  Eyes:     Pupils: Pupils are equal, round, and reactive to light.  Neck:     Thyroid: Thyromegaly present.     Vascular: No carotid bruit.  Cardiovascular:     Rate and Rhythm: Normal rate. Rhythm irregular.      Pulses: Normal pulses.     Heart sounds: Murmur heard.  Pulmonary:     Breath sounds: Normal breath sounds. No wheezing, rhonchi or rales.     Comments: Increased WOB with movement. On O2. At baseline.  Abdominal:     General: Bowel sounds are normal. There is no distension.     Palpations: Abdomen is soft.     Tenderness: There is no abdominal tenderness. There is no right CVA tenderness, left CVA tenderness or guarding.  Musculoskeletal:        General: Normal range of motion.     Cervical back: Normal range of motion.     Right lower leg: 1+ Pitting Edema present.     Left lower leg: 1+ Pitting Edema present.     Comments: Non pitting edema present bilaterally. No worse than baseline.   Skin:    General: Skin is warm and dry.     Capillary Refill: Capillary refill takes less than 2 seconds.  Neurological:     General: No focal deficit present.     Mental Status: She is alert and oriented to person, place, and time.     Motor: Weakness present.     Gait: Gait abnormal.  Psychiatric:        Mood and Affect: Mood normal.     PLAN Problem List Items Addressed This Visit     Chronic respiratory failure with hypoxia (HCC)    Chronic respiratory failure in the setting of COPD  on 2 L nasal cannula at baseline.  There does not appear to be significant exacerbation of her symptoms at this time, although she continues to experience dyspnea with exertion.  At this time recommend continuation of Mucinex and Trelegy for management.      Persistent atrial fibrillation (HCC)    Persistent atrial fibrillation currently anticoagulated with Eliquis.  She has no signs of bleeding present at this time.  She is experiencing some shortness of breath however this is attributed to chronic respiratory failure and is not new or worsening to the point to suspect PE.  Will continue to monitor closely.      Chronic CHF (congestive heart failure) (HCC)    Chronic congestive heart failure with no signs  of exacerbation at this time.  Unfortunately she is chronically short of breath at baseline with 1+ bilateral edema.  This does not appear to be worse at this time.  She is on 2 L of oxygen with nasal cannula in the office today.  No rhonchi noted in her lungs.  Recommend continuation of current medication regimen and following closely with cardiology.  Recommend follow-up immediately for any worsening shortness of breath, lower extremity swelling, weakness, fatigue, fevers or chills.      Morbid obesity with body mass index (BMI) of 40.0 or higher (HCC)    Chronic morbid obesity in the setting of hypertension, hyperlipidemia, chronic heart failure, and COPD.  Unfortunately her respiratory and cardiac status limit her ability for physical activity.  Dietary recommendations of low calorie, low carbohydrate, low-fat diet are recommended.  Will continue to monitor closely.      Oxygen dependent    She continues on 2 L nasal cannula at baseline.  No need for increased oxygen at this time.  Continue to follow with pulmonology closely.      UTI symptoms    Patient expresses concern for dysuria.  Unfortunately we were unable to obtain a urine sample today.  We are starting doxycycline for management of prophylactic treatment for sebaceous cyst removal which may also provide relief for possible urinary tract infection.  The patient does have a history of cystocele and chronic urinary symptoms.  She is followed by urogynecology for management.  Recommend close monitoring and follow-up if symptoms worsen or fail to improve.      Bilateral lower extremity edema    1+ bilateral lower extremity edema present, this is consistent with her baseline.  She is experiencing increased shortness of breath with exertion, however, is not requiring increased oxygenation to maintain O2 saturations.  She does not appear to be in any distress today.  There is some congestion noted within the lungs.  She is currently managing  this with Mucinex daily and Trelegy.  She has a follow-up with pulmonology in the near future.  No fevers, chills, respiratory distress noted at this time.  I do not feel that her symptoms warrant steroids at this time as she is not in any distress.  We are starting doxycycline for prophylaxis following the extraction of sebaceous cyst on the scalp.  I am hopeful this will additionally help with her breathing, if needed.  We will plan to follow-up very closely and she is aware to contact the office or report to the hospital with any severe dyspnea.      Essential hypertension    Chronic hypertension currently not requiring medication.  Blood pressures are well-controlled today.  Will continue to monitor.      Sebaceous cyst -  Primary    Sebaceous cyst noted to the superior portion of the scalp right of the midline.  No indications of infection present at this time however the area is tender to the touch.  Patient requested removal today. Verbal consent obtained. Area cleansed and routine fashion with topical antiseptic (Betadine).  Surrounding tissue infiltrated with 1% lidocaine creating a block.  Sterile drape utilized.  In normal sterile fashion #11 blade utilized to create a 1 cm incision.  Thick, gray and white exudate drained from opening.  Cystic sac friable with difficulty removing in tact.  Sterile swab and curette utilized to release and remove as much of the cystic sac as possible.  Bleeding controlled with pressure over 5 minutes.  Skin glue utilized to close incision.  Patient tolerated procedure well with no adverse events.  Will start on doxycycline for prophylactic management of infection given the patient's chronic comorbidities and increased risk of infection.  She has been advised to follow-up immediately if she notes any symptoms of drainage, redness, pain, warmth, or return of lesion.      Relevant Medications   doxycycline (VIBRA-TABS) 100 MG tablet   Subacute cough   Relevant  Medications   doxycycline (VIBRA-TABS) 100 MG tablet   COPD (chronic obstructive pulmonary disease) (HCC)   Other Visit Diagnoses     AF (paroxysmal atrial fibrillation) (HCC)           Return in about 3 months (around 08/26/2022) for med management.   Lindsay Clamp, DNP, AGNP-c 05/26/2022  9:45 AM

## 2022-05-29 ENCOUNTER — Telehealth: Payer: Self-pay

## 2022-05-29 NOTE — Telephone Encounter (Signed)
   Telephone encounter was:  Unsuccessful.  05/29/2022 Name: Lindsay [name] MRN: [id number] DOB: [birth year]  Unsuccessful outbound call made today to assist with:  Left message on voicemail for [name] at [facility] to return my call regarding the status of patient's application.  Outreach Attempt:  2nd Attempt  A HIPAA compliant voice message was left requesting a return call.  Instructed patient to call back at [phone].  [name] [facility]  [facility] Population Health Community Resource Care Guide   [email or web address]  ?? [phone]   Website: [email or web address]  [email or web address]

## 2022-05-30 ENCOUNTER — Encounter: Payer: Self-pay | Admitting: Pulmonary Disease

## 2022-05-30 ENCOUNTER — Telehealth: Payer: Self-pay

## 2022-05-30 ENCOUNTER — Ambulatory Visit (INDEPENDENT_AMBULATORY_CARE_PROVIDER_SITE_OTHER): Payer: Medicare HMO | Admitting: Pulmonary Disease

## 2022-05-30 VITALS — BP 118/80 | HR 73 | Ht 69.0 in | Wt 235.0 lb

## 2022-05-30 DIAGNOSIS — J9611 Chronic respiratory failure with hypoxia: Secondary | ICD-10-CM | POA: Diagnosis not present

## 2022-05-30 DIAGNOSIS — J4540 Moderate persistent asthma, uncomplicated: Secondary | ICD-10-CM | POA: Diagnosis not present

## 2022-05-30 NOTE — Patient Instructions (Addendum)
Continue trelegy 1 puff daily - rinse mouth out after each use  Use albuterol as needed  Recommend using neosporin for 5- 7 days for your leg wound  Follow up in 4 months

## 2022-05-30 NOTE — Progress Notes (Signed)
Synopsis: Referred in January 2022 for chronic respiratory failure  Subjective:   PATIENT ID: Lindsay [name] GENDER: female DOB: 06/23/1940, MRN: [id number]  HPI  Chief Complaint  Patient presents with   Follow-up    6 mo f/u. Increased wheezing and SOB for the past few weeks.    Lindsay [name] is a 82 year old woman, never smoker with chronic respiratory failure on 2L of supplemental oxygen who returns to pulmonary clinic for follow up.  She went to ER 05/21/22 for cough, dysphagia. Referred to GI. Placed on soft diet. She is feeling better with less cough and upper airway congestion.   She has skin tear on left shin from 3 days ago.   OV 12/20/21 She was treated for asthma exacerbation 12/07/21 by Rhunette Croft, NP likely secondary to viral infeciton. She was treated with prednisone taper and azithromycin. She is not feeling any better. She continues to have chest congestion and is having trouble coughing it out. She is using zyrtec and mucinex. She continues on trelegy ellipta 1 puff daily. She also complains of decreased hearing of her right ear and sinus pressure/headaches.   OV 09/22/21 She was admitted to the hospital after last visit, 4/17 to 4/21 heart failure exacerbation. She was metapneumovirus postive on extended respiratory viral panel testing leading to acute on chronic resp failure and acute on chronic heart failure.  She reports feeling bad over the past 4-5 days with chest congestion, cough, fatigue, nausea and lower than normal blood pressure. She reports 2 covid cases at her [facility] center but denies direct exposure or contact. She denies fevers or chills. She has increased wheezing.   She is requesting a handicap placard.  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 There was also concern for asthma in which she was started on trelegy ellipta.   She reports increasing dyspnea and wheezing over the past 3 days. She has  noted O2 saturations in the 70s when walking at home on 2L O2. She has not been able to walk as far recently and required a wheelchair to enter clinic today. Her SpO2 was confirmed in clinic to drop in the 70s with ambulation this morning. She has been using duoneb treatments every 6 hours and taking extra lasix doses without improvement in her breathing. She also complains of painful urination and inreased urinary frequency over the past 2 days concerning for UTI. She reports falling at home and scraping her left elbow. She has been feeling lightheaded.   OV 02/17/2020 She reports she started having issues with her breathing 2 years ago after she suffered compound fractures in her back that led to an increase in the curvature of her thoracic spine. She was started on supplemental oxygen shortly after these events occurred. Per her chart she has exertional oxygen desaturations to 87% from her physical therapy notes. She also reports cough and wheezing intermittently. She denies sputum production. She is a never smoker but lived with a smoker for 20 years. She denies history of pneumonias.   She has been started on trelegy ellipta and she felt this helped her for a period of time but doesn't notice lasting relief from it anymore.   She has atrial fibrillation. She does have leg swelling and is taking torsemide.   She has received 2 covid vaccines. She received her pneumonia vaccine 2 weeks ago and she had the influenza vaccine this past fall.     She lived in [place] before but moved to [place] as she had frequent falls and lived on her own. Her daughter lives here in town.   Past Medical History:  Diagnosis Date   Acute diastolic CHF (congestive heart failure) (HCC) 08/03/2020   Acute non-recurrent pansinusitis 10/21/2020   Acute on chronic respiratory failure with hypoxia (HCC) 08/02/2020   Asthma    Atrial fibrillation (HCC)    Dysphagia 08/02/2020   Elevated troponin 09/14/2020   High  cholesterol    HTN (hypertension)    Hyperkalemia 09/14/2020   Hyponatremia 08/02/2020   Low grade squamous intraepithelial lesion on cytologic smear of cervix (LGSIL) 12/02/2019   Respiratory failure with hypoxia (HCC)      Family History  Problem Relation Age of Onset   Breast cancer Sister    Hypertension Other      Social History   Socioeconomic History   Marital status: Divorced    Spouse name: Not on file   Number of children: Not on file   Years of education: Not on file   Highest education level: Not on file  Occupational History   Not on file  Tobacco Use   Smoking status: Former    Types: Cigarettes   Smokeless tobacco: Never   Tobacco comments:    Quit remotely 1970s  Vaping Use   Vaping Use: Never used  Substance and Sexual Activity   Alcohol use: Not Currently   Drug use: Never   Sexual activity: Not Currently  Other Topics Concern   Not on file  Social History Narrative   Not on file   Social Determinants of Health   Financial Resource Strain: Not on file  Food Insecurity: Not on file  Transportation Needs: No Transportation Needs (05/26/2022)   PRAPARE - Administrator, Civil Service (Medical): No    Lack of Transportation (Non-Medical): No  Physical Activity: Not on file  Stress: Not on file  Social Connections: Not on file  Intimate Partner Violence: Not on file     Allergies  Allergen Reactions   Acetaminophen-Codeine Other (See Comments)    Agitation   Codeine Other (See Comments)    hallucinations     Outpatient Medications Prior to Visit  Medication Sig Dispense Refill   albuterol (PROVENTIL) (5 MG/ML) 0.5% nebulizer solution Take 0.5 mLs by nebulization every 6 (six) hours as needed for wheezing or shortness of breath.     alendronate (FOSAMAX) 70 MG tablet TAKE 1 TABLET BY MOUTH EVERY SUNDAY 12 tablet 3   apixaban (ELIQUIS) 5 MG TABS tablet TAKE 1 TABLET BY MOUTH 2 TIMES DAILY 60 tablet 2   atorvastatin (LIPITOR) 20 MG  tablet TAKE 1 TABLET BY MOUTH EVERY EVENING 90 tablet 1   Calcium-Magnesium-Vitamin D (CALCIUM 1200+D3 PO) Take 1 tablet by mouth in the morning.     cetirizine (ZYRTEC) 10 MG tablet TAKE 1 TABLET BY MOUTH EVERY DAY 30 tablet 11   doxycycline (VIBRA-TABS) 100 MG tablet Take 1 tablet (100 mg total) by mouth 2 (two) times daily. Please deliver today (Friday) 14 tablet 0   Elastic Bandages & Supports (MEDICAL COMPRESSION STOCKINGS) MISC Two pair knee high 30-41mmHg compression stockings. Sized XL. As covered by insurance for ICD-10: I50.33, R60.0. 2 each 1   Fluticasone-Umeclidin-Vilant (TRELEGY ELLIPTA) 100-62.5-25 MCG/ACT AEPB Inhale 1 puff into the lungs daily. 180 each 3   guaiFENesin (MUCINEX) 600 MG 12 hr tablet TAKE 1 TABLET BY MOUTH 2 TIMES DAILY 30 tablet 0   ipratropium-albuterol (DUONEB)  0.5-2.5 (3) MG/3ML SOLN Inhale 3 mLs into the lungs every 6 (six) hours as needed (wheezing). 1080 each 3   LORazepam (ATIVAN) 1 MG tablet Take 1 tablet (1 mg total) by mouth 2 (two) times daily as needed for anxiety. 60 tablet 2   Menthol (COUGH DROPS) 5.8 MG LOZG Use as directed 1 lozenge (5.8 mg total) in the mouth or throat as needed (for dry, sore, or scratchy throat). 36 lozenge 11   metoprolol tartrate (LOPRESSOR) 25 MG tablet Take 25 mg by mouth 2 (two) times daily.     mirtazapine (REMERON) 45 MG tablet TAKE 1 TABLET BY MOUTH AT BEDTIME 90 tablet 3   Multiple Vitamin (MULTIVITAMIN WITH MINERALS) TABS tablet Take 1 tablet by mouth in the morning.     OXYGEN Inhale 2 L into the lungs daily as needed (shortness of breath or activity level).     pantoprazole (PROTONIX) 20 MG tablet TAKE 1 TABLET BY MOUTH EVERY DAY 90 tablet 3   Phenazopyridine HCl 99.5 MG TABS Take 2 tablets by mouth 3 (three) times daily as needed (urinary pain).     sertraline (ZOLOFT) 100 MG tablet TAKE 1 TABLET BY MOUTH AT BEDTIME 90 tablet 1   sucralfate (CARAFATE) 1 g tablet Take 1 tablet (1 g total) by mouth 4 (four) times daily  -  with meals and at bedtime for 14 days. 56 tablet 0   torsemide (DEMADEX) 20 MG tablet TAKE 2 TABLETS DAILY EVERY MORNING. TAKE AN ADDITIONAL 2 TABLETS AS NEEDED FOR WEIGHT GAIN OF 2 POUNDS OVERNIGHT OR 5 POUNDS IN ONE WEEK. 225 tablet 5   potassium chloride 20 MEQ TBCR Take 20 mEq by mouth daily. 90 tablet 3   benzonatate (TESSALON) 200 MG capsule Take 1 capsule (200 mg total) by mouth 3 (three) times daily as needed for cough. (Patient not taking: Reported on 05/26/2022) 30 capsule 0   No facility-administered medications prior to visit.   Review of Systems  Constitutional:  Negative for chills, fever, malaise/fatigue and weight loss.  HENT:  Positive for congestion and hearing loss. Negative for sinus pain and sore throat.   Eyes: Negative.   Respiratory:  Positive for cough, sputum production, shortness of breath and wheezing. Negative for hemoptysis.   Cardiovascular:  Negative for chest pain, palpitations, orthopnea, claudication and leg swelling.  Gastrointestinal:  Negative for abdominal pain, heartburn, nausea and vomiting.  Genitourinary: Negative.   Musculoskeletal:  Negative for back pain, falls, joint pain and myalgias.  Skin:  Negative for rash.  Neurological:  Positive for headaches. Negative for dizziness and weakness.  Endo/Heme/Allergies: Negative.   Psychiatric/Behavioral: Negative.      Objective:   Vitals:   05/30/22 1044  BP: 118/80  Pulse: 73  SpO2: 96%  Weight: 235 lb (106.6 kg)  Height: 5\' 9"  (1.753 m)    Physical Exam Constitutional:      General: She is not in acute distress.    Appearance: She is obese.  HENT:     Head: Normocephalic and atraumatic.  Eyes:     General: No scleral icterus.    Conjunctiva/sclera: Conjunctivae normal.  Cardiovascular:     Rate and Rhythm: Normal rate and regular rhythm.     Pulses: Normal pulses.     Heart sounds: Normal heart sounds. No murmur heard. Pulmonary:     Breath sounds: Decreased air movement present.  No wheezing, rhonchi or rales.  Musculoskeletal:     Right lower leg: No edema.  Left lower leg: No edema.     Comments: Scoliosis noted  Skin:    General: Skin is warm and dry.     Findings: Lesion (skin tear, left shin, no erythema or purulent drainage) present.  Neurological:     General: No focal deficit present.     Mental Status: She is alert.    CBC    Component Value Date/Time   WBC 11.8 (H) 05/21/2022 1545   RBC 4.73 05/21/2022 1545   HGB 14.1 05/21/2022 1545   HGB 12.8 07/11/2021 1409   HGB 13.0 10/21/2020 1104   HCT 43.6 05/21/2022 1545   HCT 39.5 10/21/2020 1104   PLT 314 05/21/2022 1545   PLT 273 07/11/2021 1409   PLT 320 10/21/2020 1104   MCV 92.2 05/21/2022 1545   MCV 94 10/21/2020 1104   MCH 29.8 05/21/2022 1545   MCHC 32.3 05/21/2022 1545   RDW 14.5 05/21/2022 1545   RDW 12.8 10/21/2020 1104   LYMPHSABS 1.4 05/21/2022 1545   LYMPHSABS 1.2 10/21/2020 1104   MONOABS 0.8 05/21/2022 1545   EOSABS 0.0 05/21/2022 1545   EOSABS 0.1 10/21/2020 1104   BASOSABS 0.0 05/21/2022 1545   BASOSABS 0.1 10/21/2020 1104      Latest Ref Rng & Units 05/21/2022    3:45 PM 07/11/2021    2:09 PM 05/13/2021    5:39 AM  BMP  Glucose 70 - 99 mg/dL 85  782  956   BUN 8 - 23 mg/dL 34  21  37   Creatinine 0.44 - 1.00 mg/dL 2.13  0.86  5.78   Sodium 135 - 145 mmol/L 144  145  141   Potassium 3.5 - 5.1 mmol/L 3.0  4.0  4.1   Chloride 98 - 111 mmol/L 97  99  96   CO2 22 - 32 mmol/L 37  36  36   Calcium 8.9 - 10.3 mg/dL 9.2  46.9  9.2    Chest imaging: CTA Chest 05/21/22 1. No pulmonary embolus. 2. Cardiomegaly 3. Stable aneurysmal aortic arch (4 cm). Recommend semi-annual imaging followup by CTA or MRA and referral to cardiothoracic surgery if not already obtained. This recommendation follows 2010 ACCF/AHA/AATS/ACR/ASA/SCA/SCAI/SIR/STS/SVM Guidelines for the Diagnosis and Management of Patients With Thoracic Aortic Disease. Circulation. 2010; 121: G295-M84. Aortic  aneurysm NOS (ICD10-I71.9). 4. No acute intrapulmonary abnormality. 5. Aortic Atherosclerosis (ICD10-I70.0) with at least 2 vessel coronary artery calcifications.  CXR 12/07/21 Frontal and lateral views of the chest demonstrate an enlarged cardiac silhouette. There is ectasia of the thoracic aorta. There is increased central vascular congestion without overt edema. No effusion or pneumothorax. The lungs remain hyperinflated. Multiple thoracic compression deformities are again seen, with exaggerated thoracic kyphosis.  CXR 12/16/19 The lungs are under aerated with bronchovascular crowding and interstitial prominence. Increased AP diameter on the lateral view. No focal airspace opacities.  Minimal blunting of both costophrenic angles.  Mild enlargement of the cardiac silhouette.  Prominent central vessels. The thoracic aorta is markedly tortuous. Moderate scattered bony degenerative changes. Increased kyphosis with decreased height of numerous thoracic vertebral bodies. Levoscoliosis of the upper thoracic spine. Diffuse decreased bone density.  PFT:     No data to display          Labs: Reviewed from [facility] 1.11 on 12/02/19 Normal CBC  Assessment & Plan:   Chronic respiratory failure with hypoxia (HCC)  Moderate persistent asthma without complication  Discussion: Lindsay [name] is a 82 year old woman, never smoker with  chronic respiratory failure on 2L of supplemental oxygen who returns to pulmonary clinic for asthma.  She is to continue trelegy ellipta 1 puff daily. She is to continue supplemental oxygen.   She has referral in place to see GI for her esophageal dysmotility. Recommend she continue her modified diet.   Her skin tear on left shin does not appear infected at this time. Recommend neosporin ointment daily and keep wound dry.   Follow up in 4 months.  Melody Comas, MD [facility] Office: [phone]    Current  Outpatient Medications:    albuterol (PROVENTIL) (5 MG/ML) 0.5% nebulizer solution, Take 0.5 mLs by nebulization every 6 (six) hours as needed for wheezing or shortness of breath., Disp: , Rfl:    alendronate (FOSAMAX) 70 MG tablet, TAKE 1 TABLET BY MOUTH EVERY SUNDAY, Disp: 12 tablet, Rfl: 3   apixaban (ELIQUIS) 5 MG TABS tablet, TAKE 1 TABLET BY MOUTH 2 TIMES DAILY, Disp: 60 tablet, Rfl: 2   atorvastatin (LIPITOR) 20 MG tablet, TAKE 1 TABLET BY MOUTH EVERY EVENING, Disp: 90 tablet, Rfl: 1   Calcium-Magnesium-Vitamin D (CALCIUM 1200+D3 PO), Take 1 tablet by mouth in the morning., Disp: , Rfl:    cetirizine (ZYRTEC) 10 MG tablet, TAKE 1 TABLET BY MOUTH EVERY DAY, Disp: 30 tablet, Rfl: 11   doxycycline (VIBRA-TABS) 100 MG tablet, Take 1 tablet (100 mg total) by mouth 2 (two) times daily. Please deliver today (Friday), Disp: 14 tablet, Rfl: 0   Elastic Bandages & Supports (MEDICAL COMPRESSION STOCKINGS) MISC, Two pair knee high 30-12mmHg compression stockings. Sized XL. As covered by insurance for ICD-10: I50.33, R60.0., Disp: 2 each, Rfl: 1   Fluticasone-Umeclidin-Vilant (TRELEGY ELLIPTA) 100-62.5-25 MCG/ACT AEPB, Inhale 1 puff into the lungs daily., Disp: 180 each, Rfl: 3   guaiFENesin (MUCINEX) 600 MG 12 hr tablet, TAKE 1 TABLET BY MOUTH 2 TIMES DAILY, Disp: 30 tablet, Rfl: 0   ipratropium-albuterol (DUONEB) 0.5-2.5 (3) MG/3ML SOLN, Inhale 3 mLs into the lungs every 6 (six) hours as needed (wheezing)., Disp: 1080 each, Rfl: 3   LORazepam (ATIVAN) 1 MG tablet, Take 1 tablet (1 mg total) by mouth 2 (two) times daily as needed for anxiety., Disp: 60 tablet, Rfl: 2   Menthol (COUGH DROPS) 5.8 MG LOZG, Use as directed 1 lozenge (5.8 mg total) in the mouth or throat as needed (for dry, sore, or scratchy throat)., Disp: 36 lozenge, Rfl: 11   metoprolol tartrate (LOPRESSOR) 25 MG tablet, Take 25 mg by mouth 2 (two) times daily., Disp: , Rfl:    mirtazapine (REMERON) 45 MG tablet, TAKE 1 TABLET BY MOUTH AT  BEDTIME, Disp: 90 tablet, Rfl: 3   Multiple Vitamin (MULTIVITAMIN WITH MINERALS) TABS tablet, Take 1 tablet by mouth in the morning., Disp: , Rfl:    OXYGEN, Inhale 2 L into the lungs daily as needed (shortness of breath or activity level)., Disp: , Rfl:    pantoprazole (PROTONIX) 20 MG tablet, TAKE 1 TABLET BY MOUTH EVERY DAY, Disp: 90 tablet, Rfl: 3   Phenazopyridine HCl 99.5 MG TABS, Take 2 tablets by mouth 3 (three) times daily as needed (urinary pain)., Disp: , Rfl:    sertraline (ZOLOFT) 100 MG tablet, TAKE 1 TABLET BY MOUTH AT BEDTIME, Disp: 90 tablet, Rfl: 1   sucralfate (CARAFATE) 1 g tablet, Take 1 tablet (1 g total) by mouth 4 (four) times daily -  with meals and at bedtime for 14 days., Disp: 56 tablet, Rfl: 0   torsemide (  DEMADEX) 20 MG tablet, TAKE 2 TABLETS DAILY EVERY MORNING. TAKE AN ADDITIONAL 2 TABLETS AS NEEDED FOR WEIGHT GAIN OF 2 POUNDS OVERNIGHT OR 5 POUNDS IN ONE WEEK., Disp: 225 tablet, Rfl: 5   potassium chloride 20 MEQ TBCR, Take 20 mEq by mouth daily., Disp: 90 tablet, Rfl: 3

## 2022-05-30 NOTE — Telephone Encounter (Signed)
   Telephone encounter was:  Successful.  05/30/2022 Name: Lindsay [name] MRN: [id number] DOB: [birth year]  Lindsay [name] is a 82 y.o. year old female who is a primary care patient of Early, Sung Amabile, NP . The community resource team was consulted for assistance with Transportation Needs   Care guide performed the following interventions: Spoke to patient's daughter Lindsay [name], informed her that I have not received a response from [facility].  Verified Lindsay [name]'s email address to send [facility] brochure, contact information and physical address. Lindsay [name] stated she would continue to try to contact them and may visit in-person.  Follow Up Plan:  No further follow up planned at this time. The patient has been provided with needed resources.  Lindsay [name] [name]  [facility] Population Health Community Resource Care Guide   ??[name]  ?? [phone]   Website: [email or web address]  [email or web address]

## 2022-06-05 DIAGNOSIS — M79672 Pain in left foot: Secondary | ICD-10-CM | POA: Diagnosis not present

## 2022-06-05 DIAGNOSIS — B351 Tinea unguium: Secondary | ICD-10-CM | POA: Diagnosis not present

## 2022-06-05 DIAGNOSIS — M79671 Pain in right foot: Secondary | ICD-10-CM | POA: Diagnosis not present

## 2022-06-05 DIAGNOSIS — L600 Ingrowing nail: Secondary | ICD-10-CM | POA: Diagnosis not present

## 2022-06-07 DIAGNOSIS — I509 Heart failure, unspecified: Secondary | ICD-10-CM | POA: Diagnosis not present

## 2022-06-07 DIAGNOSIS — M810 Age-related osteoporosis without current pathological fracture: Secondary | ICD-10-CM | POA: Diagnosis not present

## 2022-06-07 DIAGNOSIS — E785 Hyperlipidemia, unspecified: Secondary | ICD-10-CM | POA: Diagnosis not present

## 2022-06-07 DIAGNOSIS — F419 Anxiety disorder, unspecified: Secondary | ICD-10-CM | POA: Diagnosis not present

## 2022-06-07 DIAGNOSIS — J449 Chronic obstructive pulmonary disease, unspecified: Secondary | ICD-10-CM | POA: Diagnosis not present

## 2022-06-07 DIAGNOSIS — E119 Type 2 diabetes mellitus without complications: Secondary | ICD-10-CM | POA: Diagnosis not present

## 2022-06-07 DIAGNOSIS — D518 Other vitamin B12 deficiency anemias: Secondary | ICD-10-CM | POA: Diagnosis not present

## 2022-06-07 DIAGNOSIS — I4891 Unspecified atrial fibrillation: Secondary | ICD-10-CM | POA: Diagnosis not present

## 2022-06-07 DIAGNOSIS — I10 Essential (primary) hypertension: Secondary | ICD-10-CM | POA: Diagnosis not present

## 2022-06-11 ENCOUNTER — Encounter: Payer: Self-pay | Admitting: Nurse Practitioner

## 2022-06-11 NOTE — Assessment & Plan Note (Signed)
Persistent atrial fibrillation currently anticoagulated with Eliquis.  She has no signs of bleeding present at this time.  She is experiencing some shortness of breath however this is attributed to chronic respiratory failure and is not new or worsening to the point to suspect PE.  Will continue to monitor closely.

## 2022-06-11 NOTE — Assessment & Plan Note (Signed)
Chronic hypertension currently not requiring medication.  Blood pressures are well-controlled today.  Will continue to monitor.

## 2022-06-11 NOTE — Assessment & Plan Note (Signed)
Chronic respiratory failure in the setting of COPD on 2 L nasal cannula at baseline.  There does not appear to be significant exacerbation of her symptoms at this time, although she continues to experience dyspnea with exertion.  At this time recommend continuation of Mucinex and Trelegy for management.

## 2022-06-11 NOTE — Assessment & Plan Note (Signed)
Sebaceous cyst noted to the superior portion of the scalp right of the midline.  No indications of infection present at this time however the area is tender to the touch.  Patient requested removal today. Verbal consent obtained. Area cleansed and routine fashion with topical antiseptic (Betadine).  Surrounding tissue infiltrated with 1% lidocaine creating a block.  Sterile drape utilized.  In normal sterile fashion #11 blade utilized to create a 1 cm incision.  Thick, gray and white exudate drained from opening.  Cystic sac friable with difficulty removing in tact.  Sterile swab and curette utilized to release and remove as much of the cystic sac as possible.  Bleeding controlled with pressure over 5 minutes.  Skin glue utilized to close incision.  Patient tolerated procedure well with no adverse events.  Will start on doxycycline for prophylactic management of infection given the patient's chronic comorbidities and increased risk of infection.  She has been advised to follow-up immediately if she notes any symptoms of drainage, redness, pain, warmth, or return of lesion.

## 2022-06-11 NOTE — Assessment & Plan Note (Signed)
Chronic morbid obesity in the setting of hypertension, hyperlipidemia, chronic heart failure, and COPD.  Unfortunately her respiratory and cardiac status limit her ability for physical activity.  Dietary recommendations of low calorie, low carbohydrate, low-fat diet are recommended.  Will continue to monitor closely.

## 2022-06-11 NOTE — Assessment & Plan Note (Signed)
1+ bilateral lower extremity edema present, this is consistent with her baseline.  She is experiencing increased shortness of breath with exertion, however, is not requiring increased oxygenation to maintain O2 saturations.  She does not appear to be in any distress today.  There is some congestion noted within the lungs.  She is currently managing this with Mucinex daily and Trelegy.  She has a follow-up with pulmonology in the near future.  No fevers, chills, respiratory distress noted at this time.  I do not feel that her symptoms warrant steroids at this time as she is not in any distress.  We are starting doxycycline for prophylaxis following the extraction of sebaceous cyst on the scalp.  I am hopeful this will additionally help with her breathing, if needed.  We will plan to follow-up very closely and she is aware to contact the office or report to the hospital with any severe dyspnea.

## 2022-06-11 NOTE — Assessment & Plan Note (Signed)
Patient expresses concern for dysuria.  Unfortunately we were unable to obtain a urine sample today.  We are starting doxycycline for management of prophylactic treatment for sebaceous cyst removal which may also provide relief for possible urinary tract infection.  The patient does have a history of cystocele and chronic urinary symptoms.  She is followed by urogynecology for management.  Recommend close monitoring and follow-up if symptoms worsen or fail to improve.

## 2022-06-11 NOTE — Assessment & Plan Note (Signed)
She continues on 2 L nasal cannula at baseline.  No need for increased oxygen at this time.  Continue to follow with pulmonology closely.

## 2022-06-11 NOTE — Assessment & Plan Note (Signed)
Chronic congestive heart failure with no signs of exacerbation at this time.  Unfortunately she is chronically short of breath at baseline with 1+ bilateral edema.  This does not appear to be worse at this time.  She is on 2 L of oxygen with nasal cannula in the office today.  No rhonchi noted in her lungs.  Recommend continuation of current medication regimen and following closely with cardiology.  Recommend follow-up immediately for any worsening shortness of breath, lower extremity swelling, weakness, fatigue, fevers or chills.

## 2022-06-13 ENCOUNTER — Ambulatory Visit: Payer: Medicare HMO | Admitting: Nurse Practitioner

## 2022-06-14 ENCOUNTER — Emergency Department (HOSPITAL_BASED_OUTPATIENT_CLINIC_OR_DEPARTMENT_OTHER)
Admission: EM | Admit: 2022-06-14 | Discharge: 2022-06-14 | Disposition: A | Discharge status: Home / Self Care | Payer: Medicare HMO | Attending: Emergency Medicine | Admitting: Emergency Medicine

## 2022-06-14 ENCOUNTER — Other Ambulatory Visit: Payer: Self-pay

## 2022-06-14 DIAGNOSIS — X58XXXA Exposure to other specified factors, initial encounter: Secondary | ICD-10-CM | POA: Insufficient documentation

## 2022-06-14 DIAGNOSIS — S81802A Unspecified open wound, left lower leg, initial encounter: Secondary | ICD-10-CM | POA: Diagnosis not present

## 2022-06-14 DIAGNOSIS — R600 Localized edema: Secondary | ICD-10-CM | POA: Diagnosis not present

## 2022-06-14 DIAGNOSIS — Z7901 Long term (current) use of anticoagulants: Secondary | ICD-10-CM | POA: Diagnosis not present

## 2022-06-14 LAB — URINALYSIS, ROUTINE W REFLEX MICROSCOPIC
Bacteria, UA: NONE SEEN
Bilirubin Urine: NEGATIVE
Glucose, UA: NEGATIVE mg/dL
Hgb urine dipstick: NEGATIVE
Ketones, ur: NEGATIVE mg/dL
Nitrite: NEGATIVE
Protein, ur: NEGATIVE mg/dL
Specific Gravity, Urine: 1.018 (ref 1.005–1.030)
pH: 5.5 (ref 5.0–8.0)

## 2022-06-14 LAB — CBC WITH DIFFERENTIAL/PLATELET
Abs Immature Granulocytes: 0.04 10*3/uL (ref 0.00–0.07)
Basophils Absolute: 0.1 10*3/uL (ref 0.0–0.1)
Basophils Relative: 1 %
Eosinophils Absolute: 0.2 10*3/uL (ref 0.0–0.5)
Eosinophils Relative: 2 %
HCT: 38.1 % (ref 36.0–46.0)
Hemoglobin: 12.6 g/dL (ref 12.0–15.0)
Immature Granulocytes: 1 %
Lymphocytes Relative: 14 %
Lymphs Abs: 1.1 10*3/uL (ref 0.7–4.0)
MCH: 29.9 pg (ref 26.0–34.0)
MCHC: 33.1 g/dL (ref 30.0–36.0)
MCV: 90.3 fL (ref 80.0–100.0)
Monocytes Absolute: 0.6 10*3/uL (ref 0.1–1.0)
Monocytes Relative: 7 %
Neutro Abs: 6.1 10*3/uL (ref 1.7–7.7)
Neutrophils Relative %: 75 %
Platelets: 313 10*3/uL (ref 150–400)
RBC: 4.22 MIL/uL (ref 3.87–5.11)
RDW: 14.4 % (ref 11.5–15.5)
WBC: 8 10*3/uL (ref 4.0–10.5)
nRBC: 0 % (ref 0.0–0.2)

## 2022-06-14 LAB — COMPREHENSIVE METABOLIC PANEL
ALT: 14 U/L (ref 0–44)
AST: 20 U/L (ref 15–41)
Albumin: 4 g/dL (ref 3.5–5.0)
Alkaline Phosphatase: 73 U/L (ref 38–126)
Anion gap: 11 (ref 5–15)
BUN: 18 mg/dL (ref 8–23)
CO2: 30 mmol/L (ref 22–32)
Calcium: 9.3 mg/dL (ref 8.9–10.3)
Chloride: 99 mmol/L (ref 98–111)
Creatinine, Ser: 0.98 mg/dL (ref 0.44–1.00)
GFR, Estimated: 58 mL/min — ABNORMAL LOW (ref >60)
Glucose, Bld: 121 mg/dL — ABNORMAL HIGH (ref 70–99)
Potassium: 3.4 mmol/L — ABNORMAL LOW (ref 3.5–5.1)
Sodium: 140 mmol/L (ref 135–145)
Total Bilirubin: 0.5 mg/dL (ref 0.3–1.2)
Total Protein: 7.2 g/dL (ref 6.5–8.1)

## 2022-06-14 LAB — LACTIC ACID, PLASMA: Lactic Acid, Venous: 1.8 mmol/L (ref 0.5–1.9)

## 2022-06-14 NOTE — Discharge Instructions (Addendum)
Change the bandage next on Friday.  Elevate your leg as often as you can.  Use the Ace wrap to help with the swelling.  Complete the antibiotic you are on but you do not need any further antibiotics.  Avoid the Betadine and Neosporin.

## 2022-06-14 NOTE — ED Notes (Signed)
Left shin wound dressed.  Vaseline gauze then 4x4 gauze applied.   Wrapped with Kerlix.  Leg wrapped with ace wrap from foot to upper shin.  Teaching to patient and daughter given on application.  Gave verbal understanding of teaching

## 2022-06-14 NOTE — ED Notes (Signed)
Pt attempted to urinate. Unsuccessful. Pt has cup.

## 2022-06-14 NOTE — ED Provider Notes (Signed)
[place] EMERGENCY DEPARTMENT AT [facility] Provider Note   CSN: [id number] Arrival date & time: 06/14/22  1837     History  Chief Complaint  Patient presents with   Laceration    Lindsay [name] is a 82 y.o. female.  The history is provided by the patient.  Laceration      Home Medications Prior to Admission medications   Medication Sig Start Date End Date Taking? Authorizing Provider  albuterol (PROVENTIL) (5 MG/ML) 0.5% nebulizer solution Take 0.5 mLs by nebulization every 6 (six) hours as needed for wheezing or shortness of breath.    [provider]  alendronate (FOSAMAX) 70 MG tablet TAKE 1 TABLET BY MOUTH EVERY SUNDAY 01/06/22   Early, Sung Amabile, NP  apixaban (ELIQUIS) 5 MG TABS tablet TAKE 1 TABLET BY MOUTH 2 TIMES DAILY 05/15/22   Alver Sorrow, NP  atorvastatin (LIPITOR) 20 MG tablet TAKE 1 TABLET BY MOUTH EVERY EVENING 01/06/22   Early, Sung Amabile, NP  Calcium-Magnesium-Vitamin D (CALCIUM 1200+D3 PO) Take 1 tablet by mouth in the morning.    [provider]  cetirizine (ZYRTEC) 10 MG tablet TAKE 1 TABLET BY MOUTH EVERY DAY 02/06/22   Martina Sinner, MD  doxycycline (VIBRA-TABS) 100 MG tablet Take 1 tablet (100 mg total) by mouth 2 (two) times daily. Please deliver today (Friday) 05/26/22   Early, Sung Amabile, NP  Elastic Bandages & Supports (MEDICAL COMPRESSION STOCKINGS) MISC Two pair knee high 30-28mmHg compression stockings. Sized XL. As covered by insurance for ICD-10: I50.33, R60.0. 03/09/21   Early, Sung Amabile, NP  Fluticasone-Umeclidin-Vilant (TRELEGY ELLIPTA) 100-62.5-25 MCG/ACT AEPB Inhale 1 puff into the lungs daily. 12/20/21   Martina Sinner, MD  guaiFENesin (MUCINEX) 600 MG 12 hr tablet TAKE 1 TABLET BY MOUTH 2 TIMES DAILY 03/22/22   Cobb, Ruby Cola, NP  ipratropium-albuterol (DUONEB) 0.5-2.5 (3) MG/3ML SOLN Inhale 3 mLs into the lungs every 6 (six) hours as needed (wheezing). 05/09/21   Martina Sinner, MD  LORazepam (ATIVAN) 1 MG tablet  Take 1 tablet (1 mg total) by mouth 2 (two) times daily as needed for anxiety. 02/27/22   Tollie Eth, NP  Menthol (COUGH DROPS) 5.8 MG LOZG Use as directed 1 lozenge (5.8 mg total) in the mouth or throat as needed (for dry, sore, or scratchy throat). 11/16/20   Tollie Eth, NP  metoprolol tartrate (LOPRESSOR) 25 MG tablet Take 25 mg by mouth 2 (two) times daily. 12/26/21   [provider]  mirtazapine (REMERON) 45 MG tablet TAKE 1 TABLET BY MOUTH AT BEDTIME 10/27/21   Early, Sung Amabile, NP  Multiple Vitamin (MULTIVITAMIN WITH MINERALS) TABS tablet Take 1 tablet by mouth in the morning.    [provider]  OXYGEN Inhale 2 L into the lungs daily as needed (shortness of breath or activity level).    [provider]  pantoprazole (PROTONIX) 20 MG tablet TAKE 1 TABLET BY MOUTH EVERY DAY 06/07/21   Early, Sung Amabile, NP  Phenazopyridine HCl 99.5 MG TABS Take 2 tablets by mouth 3 (three) times daily as needed (urinary pain).    [provider]  potassium chloride 20 MEQ TBCR Take 20 mEq by mouth daily. 01/02/22 02/01/22  Alver Sorrow, NP  sertraline (ZOLOFT) 100 MG tablet TAKE 1 TABLET BY MOUTH AT BEDTIME 01/06/22   Early, Sung Amabile, NP  sucralfate (CARAFATE) 1 g tablet Take 1 tablet (1 g total) by mouth 4 (four) times daily -  with  meals and at bedtime for 14 days. 05/21/22 06/04/22  Curatolo, Adam, DO  torsemide (DEMADEX) 20 MG tablet TAKE 2 TABLETS DAILY EVERY MORNING. TAKE AN ADDITIONAL 2 TABLETS AS NEEDED FOR WEIGHT GAIN OF 2 POUNDS OVERNIGHT OR 5 POUNDS IN ONE WEEK. 09/01/21   Early, Sung Amabile, NP      Allergies    Acetaminophen-codeine, Naproxen-esomeprazole mg, and Codeine    Review of Systems   Review of Systems  Physical Exam Updated Vital Signs BP 97/67 (BP Location: Left Arm)   Pulse 99   Temp 98.1 F (36.7 C) (Oral)   Resp 20   Ht 5\' 9"  (1.753 m)   Wt 106.6 kg   SpO2 93%   BMI 34.71 kg/m  Physical Exam Vitals and nursing note reviewed.  Pulmonary:      Effort: Pulmonary effort is normal.  Musculoskeletal:     Right lower leg: Edema present.     Left lower leg: Edema present.       Legs:     Comments: 3+ pitting edema in the left lower extremity 1+ in the right lower extremity  Skin:    General: Skin is warm and dry.  Neurological:     Mental Status: She is alert. Mental status is at baseline.     ED Results / Procedures / Treatments   Labs (all labs ordered are listed, but only abnormal results are displayed) Labs Reviewed  COMPREHENSIVE METABOLIC PANEL - Abnormal; Notable for the following components:      Result Value   Potassium 3.4 (*)    Glucose, Bld 121 (*)    GFR, Estimated 58 (*)    All other components within normal limits  LACTIC ACID, PLASMA  CBC WITH DIFFERENTIAL/PLATELET  LACTIC ACID, PLASMA  URINALYSIS, ROUTINE W REFLEX MICROSCOPIC    EKG None  Radiology No results found.  Procedures Procedures    Medications Ordered in ED Medications - No data to display  ED Course/ Medical Decision Making/ A&P                             Medical Decision Making Amount and/or Complexity of Data Reviewed Independent Historian:     Details: Daughter External Data Reviewed: notes. Labs: ordered. Decision-making details documented in ED Course.   Pt with multiple medical problems and comorbidities and presenting today with a complaint that caries a high risk for morbidity and mortality.  Patient here today with a persistent wound on the shin of her left leg.  It is now been there for 3 weeks and is not improving.  She was seen by a podiatrist on 516 and at that time they told her to put Betadine on it and she started on Augmentin.  She has not noticed any improvement since then.  Prior to that she was putting Neosporin on the area.  Patient's wound does not appear infected at this time.  She does have some mild erythema of the skin on the lower portion of the leg but there is no warmth or pain.  She has significant  edema in the leg as well.  Discussed with the patient and her daughter who is present at bedside that this is an area that takes a long time to heal and concern she will take months to heal her wound.  I independently interpreted patient's labs today and lactic acid, CBC, CMP are all within normal limits.  She can finish the Augmentin  but do not feel that she needs to start any other antibiotics.  Will have her stop Betadine and Neosporin and have her just use Vaseline impregnated gauze.  Also will avoid any tape products and Band-Aids as concerned it may rip her skin.  Also an Ace wrap was placed to help with the swelling as she does not have any compression socks.  It would be important for her to follow-up with wound care.  Her daughter can help her change the bandages and will contact wound care in the morning.  No indication for further testing at this time.  Patient does take Xarelto and low suspicion for blood clots.  No indication for admission at this time.         Final Clinical Impression(s) / ED Diagnoses Final diagnoses:  Wound of left lower extremity, initial encounter    Rx / DC Orders ED Discharge Orders     None         Gwyneth Sprout, MD 06/14/22 2054

## 2022-06-14 NOTE — ED Notes (Signed)
DC papers reviewed. No questions or concerns. No signs of distress. Pt assisted to wheelchair and out to lobby. Appropriate measures for safety taken. 

## 2022-06-14 NOTE — ED Triage Notes (Signed)
Pt via pov from home with laceration to her left shin from 3 weeks ago. She began an antibiotic on 5/16 (augmentin) and states it is not better. Wound is bandaged and not bleeding. Pt's lower leg and foot are swollen; foot is mottled in appearance; warm to touch, not overly tender. Pt alert & oriented, nad noted.

## 2022-06-16 ENCOUNTER — Encounter: Payer: Self-pay | Admitting: Nurse Practitioner

## 2022-06-18 DIAGNOSIS — I10 Essential (primary) hypertension: Secondary | ICD-10-CM | POA: Diagnosis not present

## 2022-06-20 ENCOUNTER — Other Ambulatory Visit: Payer: Self-pay

## 2022-06-20 DIAGNOSIS — L989 Disorder of the skin and subcutaneous tissue, unspecified: Secondary | ICD-10-CM

## 2022-06-22 ENCOUNTER — Telehealth: Payer: Self-pay | Admitting: Family Medicine

## 2022-06-22 ENCOUNTER — Encounter: Payer: Self-pay | Admitting: Nurse Practitioner

## 2022-06-22 ENCOUNTER — Other Ambulatory Visit (HOSPITAL_BASED_OUTPATIENT_CLINIC_OR_DEPARTMENT_OTHER): Payer: Self-pay | Admitting: Nurse Practitioner

## 2022-06-22 ENCOUNTER — Other Ambulatory Visit: Payer: Self-pay

## 2022-06-22 DIAGNOSIS — I110 Hypertensive heart disease with heart failure: Secondary | ICD-10-CM | POA: Diagnosis not present

## 2022-06-22 DIAGNOSIS — L989 Disorder of the skin and subcutaneous tissue, unspecified: Secondary | ICD-10-CM

## 2022-06-22 DIAGNOSIS — F411 Generalized anxiety disorder: Secondary | ICD-10-CM | POA: Diagnosis not present

## 2022-06-22 DIAGNOSIS — I5033 Acute on chronic diastolic (congestive) heart failure: Secondary | ICD-10-CM

## 2022-06-22 DIAGNOSIS — F3341 Major depressive disorder, recurrent, in partial remission: Secondary | ICD-10-CM

## 2022-06-22 DIAGNOSIS — I4819 Other persistent atrial fibrillation: Secondary | ICD-10-CM | POA: Diagnosis not present

## 2022-06-22 DIAGNOSIS — S81812D Laceration without foreign body, left lower leg, subsequent encounter: Secondary | ICD-10-CM | POA: Diagnosis not present

## 2022-06-22 DIAGNOSIS — I509 Heart failure, unspecified: Secondary | ICD-10-CM | POA: Diagnosis not present

## 2022-06-22 DIAGNOSIS — M810 Age-related osteoporosis without current pathological fracture: Secondary | ICD-10-CM | POA: Diagnosis not present

## 2022-06-22 DIAGNOSIS — J449 Chronic obstructive pulmonary disease, unspecified: Secondary | ICD-10-CM | POA: Diagnosis not present

## 2022-06-22 DIAGNOSIS — J9611 Chronic respiratory failure with hypoxia: Secondary | ICD-10-CM | POA: Diagnosis not present

## 2022-06-22 NOTE — Telephone Encounter (Signed)
Refill request last apt 05/27/22 next apt 06/28/22.

## 2022-06-22 NOTE — Telephone Encounter (Signed)
Lillia Abed with [facility] wants Verbal orders for skilled nursing frequency 1 week 1, 2 weeks 3, 1 week 3 with 2 PRN.  Also needs to place Zero form quaze to her lower left leg wound which she says is black and she needs to be seen.  Lillia Abed states pt said she hit her leg 2 weeks ago and it turned black.  She asked that I call pt daughter Dimitri Hinderman [phone] to set up appt with patient as she would be bringing her.

## 2022-06-23 NOTE — Telephone Encounter (Signed)
OK to send verbal order for requested skilled nursing and Xeroform Gauze.   Pt has appt on Wednesday of next week, but order has been sent for wound care referral.

## 2022-06-26 ENCOUNTER — Other Ambulatory Visit (HOSPITAL_BASED_OUTPATIENT_CLINIC_OR_DEPARTMENT_OTHER): Payer: Self-pay | Admitting: Nurse Practitioner

## 2022-06-26 ENCOUNTER — Telehealth: Payer: Self-pay | Admitting: Nurse Practitioner

## 2022-06-26 ENCOUNTER — Ambulatory Visit (HOSPITAL_COMMUNITY): Payer: Medicare HMO | Attending: Nurse Practitioner | Admitting: Physical Therapy

## 2022-06-26 ENCOUNTER — Encounter (HOSPITAL_COMMUNITY): Payer: Self-pay | Admitting: Physical Therapy

## 2022-06-26 DIAGNOSIS — L989 Disorder of the skin and subcutaneous tissue, unspecified: Secondary | ICD-10-CM | POA: Diagnosis present

## 2022-06-26 DIAGNOSIS — R2689 Other abnormalities of gait and mobility: Secondary | ICD-10-CM | POA: Insufficient documentation

## 2022-06-26 NOTE — Telephone Encounter (Signed)
Lindsay [name] from [facility] health called and says she wont be admitted for Home Health because she is being admitted to [facility] patient [place] [facility].

## 2022-06-26 NOTE — Therapy (Signed)
OUTPATIENT PHYSICAL THERAPY WOUND EVALUATION   [name] MRN: [id number] DOB:[birth year], 82 y.o., female Today's Date: 06/26/2022   PCP: Tollie Eth, NP REFERRING PROVIDER: Tollie Eth, NP  END OF SESSION:  PT End of Session - 06/26/22 1120     Visit Number 1    Number of Visits 12    Date for PT Re-Evaluation 08/07/22    Authorization Type [employer]    Authorization Time Period 12 visits requested - check auth    Progress Note Due on Visit 10    PT Start Time 1035    PT Stop Time 1117    PT Time Calculation (min) 42 min    Activity Tolerance Patient tolerated treatment well    Behavior During Therapy [facility] for tasks assessed/performed             Past Medical History:  Diagnosis Date   Acute diastolic CHF (congestive heart failure) (HCC) 08/03/2020   Acute non-recurrent pansinusitis 10/21/2020   Acute on chronic respiratory failure with hypoxia (HCC) 08/02/2020   Asthma    Asthma exacerbation 08/02/2020   Atrial fibrillation (HCC)    Dysphagia 08/02/2020   Dyspnea 05/09/2021   Elevated troponin 09/14/2020   High cholesterol    HTN (hypertension)    Hyperkalemia 09/14/2020   Hyponatremia 08/02/2020   Low grade squamous intraepithelial lesion on cytologic smear of cervix (LGSIL) 12/02/2019   Moderate persistent asthma without complication 12/02/2019   Respiratory failure with hypoxia (HCC)    Sore throat 11/16/2020   Past Surgical History:  Procedure Laterality Date   CARDIOVERSION N/A 09/13/2020   Procedure: CARDIOVERSION;  Surgeon: Parke Poisson, MD;  Location: [facility] ENDOSCOPY;  Service: Cardiovascular;  Laterality: N/A;   ESOPHAGOGASTRODUODENOSCOPY N/A 08/05/2020   Procedure: ESOPHAGOGASTRODUODENOSCOPY (EGD);  Surgeon: Kathi Der, MD;  Location: [facility] ENDOSCOPY;  Service: Gastroenterology;  Laterality: N/A;   Patient Active Problem List   Diagnosis Date Noted   Sebaceous cyst 05/26/2022   Subacute cough 05/26/2022   Bilateral  hearing loss 05/11/2022   Essential hypertension 05/11/2021   GERD (gastroesophageal reflux disease) 05/09/2021   HLD (hyperlipidemia) 05/09/2021   Hyperglycemia 05/09/2021   Bilateral lower extremity edema 03/09/2021   Physical deconditioning 02/18/2021   COPD (chronic obstructive pulmonary disease) (HCC) 11/11/2020   GAD (generalized anxiety disorder) 10/21/2020   UTI symptoms 10/21/2020   Urinary incontinence in female 09/28/2020   Chronic CHF (congestive heart failure) (HCC) 09/14/2020   Achalasia of esophagus 08/06/2020   Chronic respiratory failure with hypoxia (HCC) 08/02/2020   Hypokalemia 08/02/2020   Chronic anticoagulation 08/02/2020   Acquired thrombophilia (HCC) 08/02/2020   Persistent atrial fibrillation (HCC) 08/02/2020   Age-related osteoporosis without current pathological fracture 01/15/2020   History of compression fracture of spine 12/02/2019   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   Morbid obesity with body mass index (BMI) of 40.0 or higher (HCC) 12/02/2019   Oxygen dependent 12/02/2019   Recurrent major depressive disorder, in partial remission (HCC) 12/02/2019   Vitamin D deficiency 12/02/2019   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    Patient and Family Stated Goals wound to heal    Date of Onset 05/29/22    Prior Treatments has tried betadine, neosporin    Pain Scale 0-10    Pain Score 0-No pain    Evaluation and Treatment Procedures Explained to  Patient/Family Yes    Evaluation and Treatment Procedures agreed to    Wound Properties Date First Assessed: 06/26/22 Time First Assessed: 1040 Wound Type: Skin tear Location: Pretibial Location Orientation: Left;Anterior Present on Admission: Yes   Wound Image Images linked: 1    Dressing Type Gauze (Comment)    Dressing Changed Changed    Dressing Status Old drainage    Dressing Change Frequency PRN    Site / Wound Assessment Yellow;Black;Red    % Wound base Red or Granulating 5%    % Wound base Yellow/Fibrinous Exudate 45%    % Wound base Black/Eschar 0%    % Wound base Other/Granulation Tissue (Comment) 50%    Peri-wound Assessment Maceration    Wound Length (cm) 3.8 cm    Wound Width (cm) 3.5 cm    Wound Surface Area (cm^2) 13.3 cm^2    Margins Unattached edges (unapproximated)    Drainage Amount Minimal    Drainage Description Serosanguineous    Treatment Cleansed;Debridement (Selective)    Selective Debridement (non-excisional) - Location Left anterior tibial wound    Selective Debridement (non-excisional) - Tools Used Forceps;Scalpel;Scissors    Selective Debridement (non-excisional) - Tissue Removed slough, peeling skin    Wound Therapy - Clinical Statement see below    Wound Therapy - Functional Problem List bathing, dressing    Factors Delaying/Impairing Wound Healing Multiple medical problems;Immobility    Hydrotherapy Plan Debridement;Dressing change;Electrical stimulation;Patient/family education;Pulsatile lavage with suction;Ultrasonic wound therapy @35  KHz (+/- 3 KHz)    Wound Therapy - Frequency 2X / week    Wound Therapy - Current Recommendations PT    Wound Plan debride and dressing changes PRN    Dressing  vaseline to perimeter, medihoney on 4x4, abd pads at ankle, kerlix, cotton, coban, #5 netting               PATIENT EDUCATION: Education details: Patient educated on exam findings, POC, dressing changes if soiled, removal of coban layer if  painful Person educated: Patient Education method: Explanation Education comprehension: verbalized understanding   HOME EXERCISE PROGRAM: N/a   GOALS: Goals reviewed with patient? Yes  SHORT TERM GOALS: Target date: 07/17/2022    Wound to be fully granulated to demonstrate wound healing.  Baseline: Goal status: INITIAL   LONG TERM GOALS: Target date: 08/07/2022    Wound to be healed to reduce risk of infection. Baseline:  Goal status: INITIAL    ASSESSMENT:  CLINICAL IMPRESSION: Patient with leg wound covered in slough and eschar at beginning of session with LLE edema. Able to debride remaining eschar and top layers of slough with continued slough and coagulated blood present. Patient will require additional debridement of slough/devitalized tissue in order to promote wound healing. Dressed wound with medihoney followed by compression wrap for edema. Patient will benefit from PT for wound care in order to promote wound healing.    OBJECTIVE IMPAIRMENTS: Abnormal gait, decreased activity tolerance, decreased balance, decreased mobility, difficulty walking, increased edema, pain, and skin integrity .   ACTIVITY LIMITATIONS: bathing, dressing, and locomotion level  PARTICIPATION LIMITATIONS: meal prep, cleaning, laundry,  and community activity  PERSONAL FACTORS: Age and 3+ comorbidities: CHF, afib, HTN, HLD  are also affecting patient's functional outcome.   REHAB POTENTIAL: Good  CLINICAL DECISION MAKING: Evolving/moderate complexity  EVALUATION COMPLEXITY: Moderate  PLAN: PT FREQUENCY: 2x/week  PT DURATION: 6 weeks  PLANNED INTERVENTIONS: Therapeutic exercises, Therapeutic activity, Neuromuscular re-education, Balance training, Gait training, Patient/Family education, Joint manipulation, Joint mobilization, Stair training, Orthotic/Fit training, DME instructions, Aquatic Therapy, Dry Needling, Electrical stimulation, Spinal manipulation, Spinal mobilization,  Cryotherapy, Moist heat, Compression bandaging, scar mobilization, Splintting, Taping, Traction, Ultrasound, Ionotophoresis 4mg /ml Dexamethasone, and Manual therapy  PLAN FOR NEXT SESSION: debride and dressing changes PRN, possibly transition to home health for wound care once debridement is no longer needed as transportation is an issue for patient   Wyman Songster, PT 06/26/2022, 11:35 AM

## 2022-06-27 ENCOUNTER — Telehealth: Payer: Self-pay | Admitting: Nurse Practitioner

## 2022-06-27 NOTE — Telephone Encounter (Signed)
Lindsay [name] called and expressed that Lindsay [name] is a drive for her wound care specialist and was wondering if it was somewhere closer. She said she did go to her appointment yesterday with them but has transportation issues and isn't sure if she can make future appointments but will try her best. Also she asks if you can give her a call, I advised you were seeing patients and it may be Lindsay [name] who will give her a call.

## 2022-06-28 ENCOUNTER — Ambulatory Visit: Payer: Medicare HMO | Admitting: Nurse Practitioner

## 2022-06-29 ENCOUNTER — Ambulatory Visit (HOSPITAL_COMMUNITY): Payer: Medicare HMO | Admitting: Physical Therapy

## 2022-06-30 ENCOUNTER — Ambulatory Visit (HOSPITAL_BASED_OUTPATIENT_CLINIC_OR_DEPARTMENT_OTHER): Payer: Medicare HMO | Admitting: Family

## 2022-06-30 ENCOUNTER — Encounter (HOSPITAL_BASED_OUTPATIENT_CLINIC_OR_DEPARTMENT_OTHER): Payer: Self-pay | Admitting: Family

## 2022-06-30 VITALS — BP 92/64 | HR 102 | Ht 68.0 in | Wt 243.0 lb

## 2022-06-30 DIAGNOSIS — E785 Hyperlipidemia, unspecified: Secondary | ICD-10-CM | POA: Diagnosis not present

## 2022-06-30 DIAGNOSIS — E876 Hypokalemia: Secondary | ICD-10-CM

## 2022-06-30 DIAGNOSIS — D6859 Other primary thrombophilia: Secondary | ICD-10-CM

## 2022-06-30 DIAGNOSIS — I5032 Chronic diastolic (congestive) heart failure: Secondary | ICD-10-CM

## 2022-06-30 DIAGNOSIS — I480 Paroxysmal atrial fibrillation: Secondary | ICD-10-CM

## 2022-06-30 DIAGNOSIS — I4819 Other persistent atrial fibrillation: Secondary | ICD-10-CM

## 2022-06-30 MED ORDER — APIXABAN 5 MG PO TABS
5.0000 mg | ORAL_TABLET | Freq: Two times a day (BID) | ORAL | 5 refills | Status: DC
Start: 2022-06-30 — End: 2023-03-07

## 2022-06-30 NOTE — Patient Instructions (Signed)
Medication Instructions:  Continue your current medications.   *If you need a refill on your cardiac medications before your next appointment, please call your pharmacy*   Lab Work: Your physician recommends that you return for lab work today: direct LDL, BMP  If you have labs (blood work) drawn today and your tests are completely normal, you will receive your results only by: MyChart Message (if you have MyChart) OR A paper copy in the mail If you have any lab test that is abnormal or we need to change your treatment, we will call you to review the results.  Follow-Up: At [facility], you and your health needs are our priority.  As part of our continuing mission to provide you with exceptional heart care, we have created designated Provider Care Teams.  These Care Teams include your primary Cardiologist (physician) and Advanced Practice Providers (APPs -  Physician Assistants and Nurse Practitioners) who all work together to provide you with the care you need, when you need it.  We recommend signing up for the patient portal called "MyChart".  Sign up information is provided on this After Visit Summary.  MyChart is used to connect with patients for Virtual Visits (Telemedicine).  Patients are able to view lab/test results, encounter notes, upcoming appointments, etc.  Non-urgent messages can be sent to your provider as well.   To learn more about what you can do with MyChart, go to [email or web address].    Your next appointment:   4-6 month(s)  Provider:   K. Italy Hilty, MD or Gillian Shields, NP    Other Instructions  Recommend calling the phone number on the back of your insurance card to inquire about rides.   We will let you know about the [facility] and additional rides through [facility]

## 2022-06-30 NOTE — Progress Notes (Unsigned)
Office Visit    [name] Date of Encounter: 06/30/2022  PCP:  Tollie Eth, NP   [facility]  Cardiologist:  Chrystie Nose, MD  Advanced Practice Provider:  No care team member to display Electrophysiologist:  None   Chief Complaint    Lindsay [name] is a 82 y.o. female  presents today for heart failure follow up   Past Medical History    Past Medical History:  Diagnosis Date   Acute diastolic CHF (congestive heart failure) (HCC) 08/03/2020   Acute non-recurrent pansinusitis 10/21/2020   Acute on chronic respiratory failure with hypoxia (HCC) 08/02/2020   Asthma    Asthma exacerbation 08/02/2020   Atrial fibrillation (HCC)    Dysphagia 08/02/2020   Dyspnea 05/09/2021   Elevated troponin 09/14/2020   High cholesterol    HTN (hypertension)    Hyperkalemia 09/14/2020   Hyponatremia 08/02/2020   Low grade squamous intraepithelial lesion on cytologic smear of cervix (LGSIL) 12/02/2019   Moderate persistent asthma without complication 12/02/2019   Respiratory failure with hypoxia (HCC)    Sore throat 11/16/2020   Past Surgical History:  Procedure Laterality Date   CARDIOVERSION N/A 09/13/2020   Procedure: CARDIOVERSION;  Surgeon: Parke Poisson, MD;  Location: [facility] ENDOSCOPY;  Service: Cardiovascular;  Laterality: N/A;   ESOPHAGOGASTRODUODENOSCOPY N/A 08/05/2020   Procedure: ESOPHAGOGASTRODUODENOSCOPY (EGD);  Surgeon: Kathi Der, MD;  Location: [facility] ENDOSCOPY;  Service: Gastroenterology;  Laterality: N/A;    Allergies  Allergies  Allergen Reactions   Acetaminophen-Codeine Other (See Comments)    Agitation   Naproxen-Esomeprazole Mg Diarrhea   Codeine Other (See Comments)    hallucinations    History of Present Illness    Lindsay [name] is a 82 y.o. 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  She has been predominantly managed by primary care. Her daughter is an Char[employer] fundraiser. Atrial fibrillation diagnosed many years ago and Eliquis initiated by primary care. Managed on Torsemide for chronic edema with improvement in fall 2021 after addition of Metolazone.   She was admitted 08/02/20 with acute on chronic hypoxic respiratory failure due to acute on chronic diastolic heart failure. 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 Treated with IV diuresis. She was evaluated by speech therapy and also underwent endoscopy with achalasia recommended for soft diet. She was discharged with [facility].  Seen in clinic 08/24/2020.  She subsequently underwent successful cardioversion 09/13/2020.  She has not been seen in clinic since that time.  Admitted 04/2021 with acute on chronic respiratory failure and asthma exacerbation.  Echocardiogram 05/10/2021 during admission with LVEF 60 to 65%, no RWMA, normal pulmonary pressure, left atrium mildly dilated, trivial MR.  She was seen 11/50/23 by pulmonology and treated for asthma exacerbation without much improvement at follow-up 12/20/2021.  She was treated with prednisone taper and Augmentin for sinus issues and also recommended for CT of the sinuses.  Seen 01/02/2022 with rate controlled atrial fibrillation with minimal symptomology recommended for continued rate control.  She was provided fluconazole for oral thrush.  ED visit 05/01/2022 for cough, dysphagia and was referred to GI.  She was placed on soft diet due to esophageal dysmotility.  ED visit 06/14/22 for persistent wound to left shin.  Podiatrist had recommended Betadine and treat  with Augmentin.  She was recommended to utilize Vaseline impregnated gauze.  No infection noted. Has since been referred to wound clinic. Unfortunately the only clinic with openings prior to  September 2024 was in [place] which has made transportation difficult due to distance and multiple appointments.   She presents today for follow up with her daughter.  She resides in independent living facility and very much enjoys the activities the new Psychologist, counselling as added such as daily seated exercise class, chair beach volleyball.  Taking 2 torsemide and potassium every morning. Out of a week takes the extra Torsemide dose 3-4 times. Unaware of atrial fibrillation. Denies bleeding complications on Eliquis.  Denies chest pain, pressure, tightness.  No orthopnea, PND.    EKGs/Labs/Other Studies Reviewed:   The following studies were reviewed today:  Cardiac Studies & Procedures       ECHOCARDIOGRAM  ECHOCARDIOGRAM COMPLETE 05/10/2021  Narrative ECHOCARDIOGRAM REPORT    Patient Name:   Lindsay [name] Date of Exam: 05/10/2021 Medical Rec #:  [id number]  Height:       68.0 in Accession #:    [id number] Weight:       232.8 lb Date of Birth:  [birth year]  BSA:          2.180 m Patient Age:    80 years   BP:           103/63 mmHg Patient Gender: F          HR:           91 bpm. Exam Location:  Inpatient  Procedure: 2D Echo, Color Doppler and Cardiac Doppler  Indications:    CHF  History:        Patient has prior history of Echocardiogram examinations. CHF, COPD; Arrythmias:Atrial Fibrillation.  Sonographer:    Cleatis Polka Referring Phys: [id number] Teddy Spike   Sonographer Comments: Suboptimal parasternal window. Image acquisition challenging due to patient body habitus. IMPRESSIONS   1. Left ventricular ejection fraction, by estimation, is 60 to 65%. The left ventricle has normal function. The left ventricle has no regional wall motion abnormalities. Left ventricular diastolic function could not be evaluated. 2. Right ventricular systolic function is normal. The right ventricular size is normal. There is normal pulmonary artery systolic pressure. The estimated right  ventricular systolic pressure is 31.8 mmHg. 3. Left atrial size was mildly dilated. 4. The mitral valve is grossly normal. Trivial mitral valve regurgitation. No evidence of mitral stenosis. 5. The aortic valve was not well visualized. Aortic valve regurgitation is not visualized. Aortic valve sclerosis is present, with no evidence of aortic valve stenosis. 6. The inferior vena cava is dilated in size with >50% respiratory variability, suggesting right atrial pressure of 8 mmHg.  Comparison(s): No significant change from prior study.  FINDINGS Left Ventricle: Left ventricular ejection fraction, by estimation, is 60 to 65%. The left ventricle has normal function. The left ventricle has no regional wall motion abnormalities. The left ventricular internal cavity size was normal in size. There is no left ventricular hypertrophy. Left ventricular diastolic function could not be evaluated due to atrial fibrillation. Left ventricular diastolic function could not be evaluated.  Right Ventricle: The right ventricular size is normal. No increase in right ventricular wall thickness. Right ventricular systolic function is normal. There is normal pulmonary artery systolic pressure. The tricuspid regurgitant velocity is 2.44 m/s, and with an assumed right atrial pressure of 8 mmHg, the estimated right ventricular systolic pressure is 31.8 mmHg.  Left Atrium:  Left atrial size was mildly dilated.  Right Atrium: Right atrial size was normal in size.  Pericardium: There is no evidence of pericardial effusion. Presence of epicardial fat layer.  Mitral Valve: The mitral valve is grossly normal. Trivial mitral valve regurgitation. No evidence of mitral valve stenosis.  Tricuspid Valve: The tricuspid valve is grossly normal. Tricuspid valve regurgitation is mild . No evidence of tricuspid stenosis.  Aortic Valve: The aortic valve was not well visualized. Aortic valve regurgitation is not visualized. Aortic valve  sclerosis is present, with no evidence of aortic valve stenosis. Aortic valve peak gradient measures 3.9 mmHg.  Pulmonic Valve: The pulmonic valve was grossly normal. Pulmonic valve regurgitation is trivial. No evidence of pulmonic stenosis.  Aorta: The aortic root and ascending aorta are structurally normal, with no evidence of dilitation.  Venous: The inferior vena cava is dilated in size with greater than 50% respiratory variability, suggesting right atrial pressure of 8 mmHg.  IAS/Shunts: The atrial septum is grossly normal.   LEFT VENTRICLE PLAX 2D LVIDd:         4.20 cm     Diastology LVIDs:         3.20 cm     LV e' medial:    5.87 cm/s LV PW:         1.10 cm     LV E/e' medial:  13.4 LV IVS:        1.20 cm     LV e' lateral:   9.79 cm/s LVOT diam:     2.00 cm     LV E/e' lateral: 8.0 LV SV:         60 LV SV Index:   28 LVOT Area:     3.14 cm  LV Volumes (MOD) LV vol d, MOD A2C: 69.8 ml LV vol d, MOD A4C: 83.9 ml LV vol s, MOD A2C: 27.7 ml LV vol s, MOD A4C: 31.4 ml LV SV MOD A2C:     42.1 ml LV SV MOD A4C:     83.9 ml LV SV MOD BP:      49.3 ml  RIGHT VENTRICLE            IVC RV Basal diam:  3.70 cm    IVC diam: 2.10 cm RV Mid diam:    3.10 cm RV S prime:     7.18 cm/s TAPSE (M-mode): 1.5 cm  LEFT ATRIUM             Index        RIGHT ATRIUM           Index LA diam:        4.00 cm 1.83 cm/m   RA Area:     21.60 cm LA Vol (A2C):   80.1 ml 36.74 ml/m  RA Volume:   56.50 ml  25.92 ml/m LA Vol (A4C):   77.3 ml 35.46 ml/m LA Biplane Vol: 80.3 ml 36.83 ml/m AORTIC VALVE AV Area (Vmax): 3.32 cm AV Vmax:        99.30 cm/s AV Peak Grad:   3.9 mmHg LVOT Vmax:      105.00 cm/s LVOT Vmean:     76.000 cm/s LVOT VTI:       0.192 m  AORTA Ao Root diam: 3.60 cm Ao Asc diam:  3.50 cm  MITRAL VALVE               TRICUSPID VALVE MV Area (PHT): 6.02 cm    TR Peak  grad:   23.8 mmHg MV Decel Time: 126 msec    TR Vmax:        244.00 cm/s MV E velocity: 78.80  cm/s MV A velocity: 32.60 cm/s  SHUNTS MV E/A ratio:  2.42        Systemic VTI:  0.19 m Systemic Diam: 2.00 cm  Lennie Odor MD Electronically signed by Lennie Odor MD Signature Date/Time: 05/10/2021/3:09:50 PM    Final                CHA2DS2-VASc Score = 6   This indicates a 9.7% annual risk of stroke. The patient's score is based upon: CHF History: 1 HTN History: 1 Diabetes History: 0 Stroke History: 0 Vascular Disease History: 1 Age Score: 2 Gender Score: 1      EKG:  EKG is not ordered today.    Recent Labs: 05/21/2022: B Natriuretic Peptide 150.1 06/14/2022: ALT 14; BUN 18; Creatinine, Ser 0.98; Hemoglobin 12.6; Platelets 313; Potassium 3.4; Sodium 140  Recent Lipid Panel    Component Value Date/Time   CHOL 179 08/04/2020 0359   TRIG 64 08/04/2020 0359   HDL 74 08/04/2020 0359   CHOLHDL 2.4 08/04/2020 0359   VLDL 13 08/04/2020 0359   LDLCALC 92 08/04/2020 0359   LDLDIRECT 113 (H) 09/09/2020 0945    Home Medications   Current Meds  Medication Sig   albuterol (PROVENTIL) (5 MG/ML) 0.5% nebulizer solution Take 0.5 mLs by nebulization every 6 (six) hours as needed for wheezing or shortness of breath.   alendronate (FOSAMAX) 70 MG tablet TAKE 1 TABLET BY MOUTH EVERY SUNDAY   apixaban (ELIQUIS) 5 MG TABS tablet TAKE 1 TABLET BY MOUTH 2 TIMES DAILY   atorvastatin (LIPITOR) 20 MG tablet TAKE 1 TABLET BY MOUTH EVERY EVENING   Calcium-Magnesium-Vitamin D (CALCIUM 1200+D3 PO) Take 1 tablet by mouth in the morning.   cetirizine (ZYRTEC) 10 MG tablet TAKE 1 TABLET BY MOUTH EVERY DAY   doxycycline (VIBRA-TABS) 100 MG tablet Take 1 tablet (100 mg total) by mouth 2 (two) times daily. Please deliver today (Friday)   Elastic Bandages & Supports (MEDICAL COMPRESSION STOCKINGS) MISC Two pair knee high 30-64mmHg compression stockings. Sized XL. As covered by insurance for ICD-10: I50.33, R60.0.   Fluticasone-Umeclidin-Vilant (TRELEGY ELLIPTA) 100-62.5-25 MCG/ACT AEPB Inhale  1 puff into the lungs daily.   guaiFENesin (MUCINEX) 600 MG 12 hr tablet TAKE 1 TABLET BY MOUTH 2 TIMES DAILY   ipratropium-albuterol (DUONEB) 0.5-2.5 (3) MG/3ML SOLN Inhale 3 mLs into the lungs every 6 (six) hours as needed (wheezing).   LORazepam (ATIVAN) 1 MG tablet TAKE 1 TABLET BY MOUTH 2 TIMES DAILY AS NEEDED FOR ANXIETY   Menthol (COUGH DROPS) 5.8 MG LOZG Use as directed 1 lozenge (5.8 mg total) in the mouth or throat as needed (for dry, sore, or scratchy throat).   metoprolol tartrate (LOPRESSOR) 25 MG tablet Take 25 mg by mouth 2 (two) times daily.   mirtazapine (REMERON) 45 MG tablet TAKE 1 TABLET BY MOUTH AT BEDTIME   Multiple Vitamin (MULTIVITAMIN WITH MINERALS) TABS tablet Take 1 tablet by mouth in the morning.   OXYGEN Inhale 2 L into the lungs daily as needed (shortness of breath or activity level).   pantoprazole (PROTONIX) 20 MG tablet TAKE 1 TABLET BY MOUTH EVERY DAY   Phenazopyridine HCl 99.5 MG TABS Take 2 tablets by mouth 3 (three) times daily as needed (urinary pain).   sertraline (ZOLOFT) 100 MG tablet TAKE 1 TABLET BY MOUTH AT  BEDTIME   torsemide (DEMADEX) 20 MG tablet TAKE 2 TABLETS DAILY EVERY MORNING. TAKE AN ADDITIONAL 2 TABLETS AS NEEDED FOR WEIGHT GAIN OF 2 POUNDS OVERNIGHT OR 5 POUNDS IN ONE WEEK.     Review of Systems     All other systems reviewed and are otherwise negative except as noted above.  Physical Exam    VS:  BP 92/64   Pulse (!) 102   Ht 5\' 8"  (1.727 m)   Wt 243 lb (110.2 kg)   SpO2 92%   BMI 36.95 kg/m  , BMI Body mass index is 36.95 kg/m.  Wt Readings from Last 3 Encounters:  06/30/22 243 lb (110.2 kg)  06/14/22 235 lb 0.2 oz (106.6 kg)  05/30/22 235 lb (106.6 kg)    GEN: Well nourished, overweight, well developed, in no acute distress. HEENT: normal. Neck: Supple, no JVD, carotid bruits, or masses. Cardiac: Irregularly irregular, no murmurs, rubs, or gallops. No clubbing, cyanosis, edema.  Radials/PT 2+ and equal bilaterally.   Respiratory:  Respirations regular and unlabored, clear to auscultation bilaterally. GI: Soft, nontender, nondistended. MS: No deformity or atrophy. Skin: Warm and dry, no rash. Neuro:  Strength and sensation are intact. Psych: Normal affect.  Assessment & Plan    Non healing LLE wound - Abrasian to left shin presently wrapped with nonadherent gauze, bandage, and ACE wrap. ACE wrap re-wrapped today. Following with wound clinic. NOtes appointments are difficult to get to. Previously completed [employer] application but status unknown, will message [facility] to inquire next steps. Discussed with Rutherford Nail, LCSW. She will call number on back of her insurance card to inquire about ride benefits. Will also reach out to [facility] to see if they have additional transportation resources given necessity of wound clinic appointments (unfortunately local centers with no availability for 3-4 months so is travelling to [place] for care).   Chronic hypoxic respiratory failure / HFpEF -Volume status difficult to ascertain based on body habitus.  Notes her exertional dyspnea and lower extremity edema are stable at baseline.  Continue torsemide 40 mg daily with additional 40 mg in the afternoon as needed for lower extremity edema. Low sodium diet, fluid restriction <2L, and daily weights encouraged. Educated to contact our office for weight gain of 2 lbs overnight or 5 lbs in one week. .   Atrial fibrillation / Chronic anticoagulation - Plan for continued rate control. Continue present dose of Toprol 25 mg daily.  Continue Eliquis 5 mg twice daily. CHA2DS2-VASc Score = 6 [CHF History: 1, HTN History: 1, Diabetes History: 0, Stroke History: 0, Vascular Disease History: 1, Age Score: 2, Gender Score: 1].   Echo 04/2021 normal LVEF.    Chronic calcification on CT/Aortic atherosclerosis/HLD, LDL goal <70 -continue Atorvastatin. Direct LDL today. 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 Continue optimal BP control and Toprol. Recommend avoidance of fluoroquinolones.  Repeat CT or MRA in 6 months for monitoring. This can be coordinated at follow up.   Hyokalemia -Likely due to diuretic therapy.  Continue present potassium supplement.  BMP for monitoring.  Obesity - Weight loss via diet and exercise encouraged. Discussed the impact being overweight would have on cardiovascular risk, atrial fibrillation, HLD, and HFpEF. Encouraged to continue participating in her chair exercise classes at [facility].   Disposition: Follow up  in 4-6 months  with Dr. Rennis Golden or APP.  Signed, Alver Sorrow, NP 06/30/2022, 3:31 PM [facility]

## 2022-07-01 LAB — BASIC METABOLIC PANEL
BUN/Creatinine Ratio: 21 (ref 12–28)
BUN: 20 mg/dL (ref 8–27)
CO2: 27 mmol/L (ref 20–29)
Calcium: 9.1 mg/dL (ref 8.7–10.3)
Chloride: 99 mmol/L (ref 96–106)
Creatinine, Ser: 0.97 mg/dL (ref 0.57–1.00)
Glucose: 90 mg/dL (ref 70–99)
Potassium: 4.2 mmol/L (ref 3.5–5.2)
Sodium: 142 mmol/L (ref 134–144)
eGFR: 59 mL/min/{1.73_m2} — ABNORMAL LOW (ref >59)

## 2022-07-01 LAB — LDL CHOLESTEROL, DIRECT: LDL Direct: 108 mg/dL — ABNORMAL HIGH (ref 0–99)

## 2022-07-02 ENCOUNTER — Encounter (HOSPITAL_BASED_OUTPATIENT_CLINIC_OR_DEPARTMENT_OTHER): Payer: Self-pay | Admitting: Family

## 2022-07-02 ENCOUNTER — Encounter: Payer: Self-pay | Admitting: Pulmonary Disease

## 2022-07-03 ENCOUNTER — Telehealth: Payer: Self-pay

## 2022-07-03 ENCOUNTER — Other Ambulatory Visit: Payer: Self-pay | Admitting: Nurse Practitioner

## 2022-07-03 ENCOUNTER — Telehealth (HOSPITAL_BASED_OUTPATIENT_CLINIC_OR_DEPARTMENT_OTHER): Payer: Self-pay

## 2022-07-03 ENCOUNTER — Ambulatory Visit (HOSPITAL_COMMUNITY): Payer: Medicare HMO | Admitting: Physical Therapy

## 2022-07-03 ENCOUNTER — Telehealth: Payer: Self-pay | Admitting: *Deleted

## 2022-07-03 ENCOUNTER — Encounter (HOSPITAL_COMMUNITY): Payer: Self-pay | Admitting: Physical Therapy

## 2022-07-03 DIAGNOSIS — I5033 Acute on chronic diastolic (congestive) heart failure: Secondary | ICD-10-CM

## 2022-07-03 DIAGNOSIS — R2689 Other abnormalities of gait and mobility: Secondary | ICD-10-CM

## 2022-07-03 DIAGNOSIS — L989 Disorder of the skin and subcutaneous tissue, unspecified: Secondary | ICD-10-CM

## 2022-07-03 DIAGNOSIS — E785 Hyperlipidemia, unspecified: Secondary | ICD-10-CM

## 2022-07-03 NOTE — Telephone Encounter (Addendum)
Seen by patient Lindsay [name] on 07/02/2022  8:41 PM; follow up my chart message sent to patient.   ----- Message from Alver Sorrow, NP sent at 07/02/2022  6:09 PM EDT ----- Stable kidney function. Normal electrolytes. Direct LDL of 108 which is not at goal of <70 to prevent progression of plaque build up. Recommend increase Atorvastatin to 40mg  daily with FLP/LFT in 3 months.

## 2022-07-03 NOTE — Telephone Encounter (Signed)
   Telephone encounter was:  Unsuccessful.  07/03/2022 Name: Lindsay [name] MRN: [id number] DOB: 07/27/1940  Unsuccessful outbound call made today to assist with:  Left message on voicemail for patient to return my call regarding transportation. I called [employer] to verify transportation benefit.  Also spoke with patient's daughter [name] who is aware of [employer] transportation benefit and has the contact number and patient also has number [phone].  [name] stated that the patient is able to call without assistance. I asked if she could call me back since [facility] requires 3 days notice and I want to make sure transportation is scheduled.  Outreach Attempt:  1st Attempt  A HIPAA compliant voice message was left requesting a return call.  Instructed patient to call back at [phone].  Orianna Biskup [facility]  [facility] Population Health Community Resource Care Guide   [email or web address]  ?? [phone]   Website: [email or web address]  [email or web address]

## 2022-07-03 NOTE — Therapy (Signed)
OUTPATIENT PHYSICAL THERAPY WOUND EVALUATION   [name] MRN: [id number] DOB:05/12/1940, 82 y.o., female Today's Date: 07/03/2022   PCP: Tollie Eth, NP REFERRING PROVIDER: Tollie Eth, NP  END OF SESSION:  PT End of Session - 07/03/22 1427     Visit Number 2    Number of Visits 12    Date for PT Re-Evaluation 08/07/22    Authorization Type [employer]    Authorization Time Period 12 visits and 1 reeval approved 6/3 -7/15    Progress Note Due on Visit 10    PT Start Time 1430    PT Stop Time 1516    PT Time Calculation (min) 46 min    Activity Tolerance Patient tolerated treatment well    Behavior During Therapy [facility] for tasks assessed/performed             Past Medical History:  Diagnosis Date   Acute diastolic CHF (congestive heart failure) (HCC) 08/03/2020   Acute non-recurrent pansinusitis 10/21/2020   Acute on chronic respiratory failure with hypoxia (HCC) 08/02/2020   Asthma    Asthma exacerbation 08/02/2020   Atrial fibrillation (HCC)    Dysphagia 08/02/2020   Dyspnea 05/09/2021   Elevated troponin 09/14/2020   High cholesterol    HTN (hypertension)    Hyperkalemia 09/14/2020   Hyponatremia 08/02/2020   Low grade squamous intraepithelial lesion on cytologic smear of cervix (LGSIL) 12/02/2019   Moderate persistent asthma without complication 12/02/2019   Respiratory failure with hypoxia (HCC)    Sore throat 11/16/2020   Past Surgical History:  Procedure Laterality Date   CARDIOVERSION N/A 09/13/2020   Procedure: CARDIOVERSION;  Surgeon: Parke Poisson, MD;  Location: [facility] ENDOSCOPY;  Service: Cardiovascular;  Laterality: N/A;   ESOPHAGOGASTRODUODENOSCOPY N/A 08/05/2020   Procedure: ESOPHAGOGASTRODUODENOSCOPY (EGD);  Surgeon: Kathi Der, MD;  Location: [facility] ENDOSCOPY;  Service: Gastroenterology;  Laterality: N/A;   Patient Active Problem List   Diagnosis Date Noted   Sebaceous cyst 05/26/2022   Subacute cough 05/26/2022    Bilateral hearing loss 05/11/2022   Essential hypertension 05/11/2021   GERD (gastroesophageal reflux disease) 05/09/2021   HLD (hyperlipidemia) 05/09/2021   Hyperglycemia 05/09/2021   Bilateral lower extremity edema 03/09/2021   Physical deconditioning 02/18/2021   COPD (chronic obstructive pulmonary disease) (HCC) 11/11/2020   GAD (generalized anxiety disorder) 10/21/2020   UTI symptoms 10/21/2020   Urinary incontinence in female 09/28/2020   Chronic CHF (congestive heart failure) (HCC) 09/14/2020   Achalasia of esophagus 08/06/2020   Chronic respiratory failure with hypoxia (HCC) 08/02/2020   Hypokalemia 08/02/2020   Chronic anticoagulation 08/02/2020   Acquired thrombophilia (HCC) 08/02/2020   Persistent atrial fibrillation (HCC) 08/02/2020   Age-related osteoporosis without current pathological fracture 01/15/2020   History of compression fracture of spine 12/02/2019   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   Morbid obesity with body mass index (BMI) of 40.0 or higher (HCC) 12/02/2019   Oxygen dependent 12/02/2019   Recurrent major depressive disorder, in partial remission (HCC) 12/02/2019   Vitamin D deficiency 12/02/2019   Second degree atrioventricular block 03/17/2014   Dyspnea on exertion 08/31/2005   Mitral valve stenosis 06/13/2004    ONSET DATE: May 2024  REFERRING DIAG: L98.9 (ICD-10-CM) - Sore on leg  THERAPY DIAG:  Other abnormalities of gait and mobility  Sore on leg  Rationale for Evaluation and Treatment: Rehabilitation     Wound Therapy - 07/03/22 1519     Subjective Patient had to take dressing  off after 3 days last time. Rewrapped her leg with an ace bandage    Patient and Family Stated Goals wound to heal    Date of Onset 05/29/22    Prior Treatments has tried betadine, neosporin    Evaluation and Treatment Procedures Explained to Patient/Family Yes    Evaluation and Treatment Procedures agreed to    Wound  Properties Date First Assessed: 06/26/22 Time First Assessed: 1040 Wound Type: Skin tear Location: Pretibial Location Orientation: Left;Anterior Present on Admission: Yes   Wound Image Images linked: 1    Dressing Type Gauze (Comment)    Dressing Changed Changed    Dressing Status Old drainage    Dressing Change Frequency PRN    Site / Wound Assessment Yellow;Red    % Wound base Red or Granulating 100%    % Wound base Yellow/Fibrinous Exudate 95%    % Wound base Black/Eschar 5%    Peri-wound Assessment Edema    Margins Unattached edges (unapproximated)    Drainage Amount Minimal    Drainage Description Serosanguineous    Treatment Cleansed;Debridement (Selective)    Selective Debridement (non-excisional) - Location Left anterior tibial wound    Selective Debridement (non-excisional) - Tools Used Forceps;Scalpel;Scissors    Selective Debridement (non-excisional) - Tissue Removed slough, peeling skin    Wound Therapy - Clinical Statement see below    Wound Therapy - Functional Problem List bathing, dressing    Factors Delaying/Impairing Wound Healing Multiple medical problems;Immobility    Hydrotherapy Plan Debridement;Dressing change;Electrical stimulation;Patient/family education;Pulsatile lavage with suction;Ultrasonic wound therapy @35  KHz (+/- 3 KHz)    Wound Therapy - Frequency 2X / week    Wound Therapy - Current Recommendations PT    Wound Plan debride and dressing changes PRN    Dressing  vaseline to perimeter, xeroform on 4x4, abd pads at ankle, kerlix,, coban, #5 netting               PATIENT EDUCATION: Education details: Patient educated on exam findings, POC, dressing changes if soiled, removal of coban layer if painful Person educated: Patient Education method: Explanation Education comprehension: verbalized understanding   HOME EXERCISE PROGRAM: N/a   GOALS: Goals reviewed with patient? Yes  SHORT TERM GOALS: Target date: 07/17/2022    Wound to be fully  granulated to demonstrate wound healing.  Baseline: Goal status: INITIAL   LONG TERM GOALS: Target date: 08/07/2022    Wound to be healed to reduce risk of infection. Baseline:  Goal status: INITIAL    ASSESSMENT:  CLINICAL IMPRESSION: Patient with wound wrapped with ace wrapped too tightly causing tourniquet affect with increased foot/ankle edema and erythema. Wound mostly covered in slough but able to debride majority of it with good granulation. Debrided margins as some were epibole. Foot edema and color improving with time during session today. Educated on elevation for LE if compression/ edema are irritating rather than removing whole dressing.  Changed dressing to xeroform followed by compression wrap for edema. Patient will benefit from PT for wound care in order to promote wound healing.    OBJECTIVE IMPAIRMENTS: Abnormal gait, decreased activity tolerance, decreased balance, decreased mobility, difficulty walking, increased edema, pain, and skin integrity .   ACTIVITY LIMITATIONS: bathing, dressing, and locomotion level  PARTICIPATION LIMITATIONS: meal prep, cleaning, laundry, and community activity  PERSONAL FACTORS: Age and 3+ comorbidities: CHF, afib, HTN, HLD  are also affecting patient's functional outcome.   REHAB POTENTIAL: Good  CLINICAL DECISION MAKING: Evolving/moderate complexity  EVALUATION COMPLEXITY: Moderate  PLAN:  PT FREQUENCY: 2x/week  PT DURATION: 6 weeks  PLANNED INTERVENTIONS: Therapeutic exercises, Therapeutic activity, Neuromuscular re-education, Balance training, Gait training, Patient/Family education, Joint manipulation, Joint mobilization, Stair training, Orthotic/Fit training, DME instructions, Aquatic Therapy, Dry Needling, Electrical stimulation, Spinal manipulation, Spinal mobilization, Cryotherapy, Moist heat, Compression bandaging, scar mobilization, Splintting, Taping, Traction, Ultrasound, Ionotophoresis 4mg /ml Dexamethasone, and Manual  therapy  PLAN FOR NEXT SESSION: debride and dressing changes PRN, possibly transition to home health for wound care once debridement is no longer needed as transportation is an issue for patient   Wyman Songster, PT 07/03/2022, 3:21 PM

## 2022-07-03 NOTE — Telephone Encounter (Signed)
   Telephone encounter was:  Unsuccessful.  07/03/2022 Name: Tasia Liz MRN: [id number] DOB: 04/05/1940  Unsuccessful outbound call made today to assist with:  Left message for Warnell Forester at [employer] to return my call regarding status of application 4th attempt.    Outreach Attempt:  3rd Attempt.  Referral closed unable to contact patient.  A HIPAA compliant voice message was left requesting a return call.  Instructed patient to call back at [phone].  Mearl Harewood [facility]  [facility] Population Health Community Resource Care Guide   [email or web address]  ?? [phone]   Website: [email or web address]  [email or web address]

## 2022-07-03 NOTE — Progress Notes (Signed)
  Care Coordination   Note   07/03/2022 Name: Lindsay [name] MRN: [id number] DOB: 09/19/1940  Lindsay [name] is a 82 y.o. year old female who sees Early, Sung Amabile, NP for primary care. I reached out to [employer] by phone today to offer care coordination services.  Lindsay [name] was given information about Care Coordination services today including:   The Care Coordination services include support from the care team which includes your Nurse Coordinator, Clinical Social Worker, or Pharmacist.  The Care Coordination team is here to help remove barriers to the health concerns and goals most important to you. Care Coordination services are voluntary, and the patient may decline or stop services at any time by request to their care team member.   Care Coordination Consent Status: Patient agreed to services and verbal consent obtained.   Follow up plan:  Telephone appointment with care coordination team member scheduled for:  07/07/22  Encounter Outcome:  Pt. Scheduled [facility] Coordination Care Guide  Direct Dial: [phone]

## 2022-07-03 NOTE — Telephone Encounter (Signed)
   Telephone encounter was:  Unsuccessful.  07/03/2022 Name: Lashell Moffitt MRN: [id number] DOB: Feb 05, 1940  Unsuccessful outbound call made today to assist with:  Left message on voicemail for patient to return my call regarding transportation for6/13/24. Spoke with patient's daughter [name] to let her know if have been able to contact her mom and she will have to call [employer] today to schedule transportation for 07/06/22.  Outreach Attempt:  2nd Attempt  A HIPAA compliant voice message was left requesting a return call.  Instructed patient to call back at [phone].  Quantel Mcinturff [facility]  [facility] Population Health Community Resource Care Guide   [email or web address]  ?? [phone]   Website: [email or web address]  [email or web address]

## 2022-07-04 ENCOUNTER — Telehealth: Payer: Self-pay

## 2022-07-04 MED ORDER — ATORVASTATIN CALCIUM 40 MG PO TABS: 40.0000 mg | ORAL_TABLET | Freq: Every day | ORAL | 3 refills | Status: DC

## 2022-07-04 NOTE — Telephone Encounter (Signed)
   Telephone encounter was:  Successful.  07/04/2022 Name: Oprah Camarena MRN: [id number] DOB: Dec 11, 1940  Tesneem Dufrane is a 82 y.o. year old female who is a primary care patient of Early, Sung Amabile, NP . The community resource team was consulted for assistance with Transportation Needs   Care guide performed the following interventions: Spoke with patient  she confirmed that she called [employer] yesterday to arrange her transportation and is aware of the three day notice required before the appointment.  Also reminded her to give them information about her oxygen and walker.  Follow Up Plan:  No further follow up planned at this time. The patient has been provided with needed resources.  Vlada Uriostegui [facility]  [facility] Population Health Community Resource Care Guide   [email or web address]  ?? [phone]   Website: [email or web address]  [email or web address]

## 2022-07-04 NOTE — Telephone Encounter (Signed)
Dr. [name], Patient sent you this message below.  Montelukast is not on patient medication list or in previous notes.    Hello,  Do I need to keep taking the montelukast nightly since I also take Mucinex twice a day?  Thank you  Message routed to Dr. [name] to advise

## 2022-07-04 NOTE — Addendum Note (Signed)
Addended by: Marlene Lard on: 07/04/2022 04:34 PM   Modules accepted: Orders

## 2022-07-05 ENCOUNTER — Encounter: Payer: Self-pay | Admitting: Pulmonary Disease

## 2022-07-06 ENCOUNTER — Ambulatory Visit (HOSPITAL_COMMUNITY): Payer: Medicare HMO

## 2022-07-06 DIAGNOSIS — H9113 Presbycusis, bilateral: Secondary | ICD-10-CM | POA: Insufficient documentation

## 2022-07-07 ENCOUNTER — Ambulatory Visit: Payer: Self-pay

## 2022-07-07 NOTE — Patient Outreach (Signed)
  Care Coordination   Initial Visit Note   07/07/2022 Name: Casarah Garrick MRN: [id number] DOB: 08/07/1940  Zoeyjane Holstad is a 82 y.o. year old female who sees Early, Sung Amabile, NP for primary care. I spoke with  [name] by phone today.  What matters to the patients health and wellness today?  Patient would like to get her breathing under better control. She would also like to change her diet to help lower her LDL.     Goals Addressed             This Visit's Progress    Continue Eliquis to help prevent stroke       Care Coordination Interventions: Counseled on increased risk of stroke due to Afib and benefits of anticoagulation for stroke prevention Reviewed importance of adherence to anticoagulant exactly as prescribed Assessed social determinant of health barriers     To get shortness of breath under control       Care Coordination Interventions: Provided patient with basic written and verbal COPD education on self care/management/and exacerbation prevention Advised patient to track and manage COPD triggers Provided written and verbal instructions on pursed lip breathing and utilized returned demonstration as teach back     To lower my LDL       Care Coordination Interventions: Provider established cholesterol goals reviewed Reviewed role and benefits of statin for ASCVD risk reduction Reviewed importance of limiting foods high in cholesterol Mailed printed educational materials related to Cholesterol management     Interventions Today    Flowsheet Row Most Recent Value  Chronic Disease   Chronic disease during today's visit Chronic Obstructive Pulmonary Disease (COPD), Atrial Fibrillation (AFib), Other  [Hyperlipidemia]  General Interventions   General Interventions Discussed/Reviewed General Interventions Discussed, General Interventions Reviewed, Labs, Doctor Visits, Durable Medical Equipment (DME)  Doctor Visits Discussed/Reviewed Doctor Visits Discussed, Doctor Visits  Reviewed, Specialist  Durable Medical Equipment (DME) Oxygen, Walker  Education Interventions   Education Provided Provided Education, Provided Therapist, sports  Provided Verbal Education On Labs, When to see the doctor  Pharmacy Interventions   Pharmacy Dicussed/Reviewed Pharmacy Topics Discussed, Pharmacy Topics Reviewed, Medications and their functions  Safety Interventions   Safety Discussed/Reviewed Safety Reviewed, Safety Discussed          SDOH assessments and interventions completed:  No     Care Coordination Interventions:  Yes, provided   Follow up plan: Follow up call scheduled for 07/21/22 @10 :30 AM    Encounter Outcome:  Pt. Visit Completed

## 2022-07-07 NOTE — Patient Instructions (Signed)
Visit Information  Thank you for taking time to visit with me today. Please don't hesitate to contact me if I can be of assistance to you.   Following are the goals we discussed today:   Goals Addressed             This Visit's Progress    Continue Eliquis to help prevent stroke       Care Coordination Interventions: Counseled on increased risk of stroke due to Afib and benefits of anticoagulation for stroke prevention Reviewed importance of adherence to anticoagulant exactly as prescribed Assessed social determinant of health barriers        To get shortness of breath under control       Care Coordination Interventions: Provided patient with basic written and verbal COPD education on self care/management/and exacerbation prevention Advised patient to track and manage COPD triggers Provided written and verbal instructions on pursed lip breathing and utilized returned demonstration as teach back        To lower my LDL       Care Coordination Interventions: Provider established cholesterol goals reviewed Reviewed role and benefits of statin for ASCVD risk reduction Reviewed importance of limiting foods high in cholesterol Mailed printed educational materials related to Cholesterol management            Our next appointment is by telephone on 07/21/22 at 10:30 AM  Please call the care guide team at [phone] if you need to cancel or reschedule your appointment.   If you are experiencing a Mental Health or Behavioral Health Crisis or need someone to talk to, please call 1-800-273-TALK (toll free, 24 hour hotline)  Patient verbalizes understanding of instructions and care plan provided today and agrees to view in MyChart. Active MyChart status and patient understanding of how to access instructions and care plan via MyChart confirmed with patient.     [name], RN, BSN, CCM Care Management Coordinator [facility] Care Management  Direct Phone: [phone]

## 2022-07-11 ENCOUNTER — Ambulatory Visit (INDEPENDENT_AMBULATORY_CARE_PROVIDER_SITE_OTHER): Payer: Medicare HMO

## 2022-07-11 VITALS — Ht 68.0 in | Wt 240.0 lb

## 2022-07-11 DIAGNOSIS — E2839 Other primary ovarian failure: Secondary | ICD-10-CM | POA: Diagnosis not present

## 2022-07-11 DIAGNOSIS — Z0000 Encounter for general adult medical examination without abnormal findings: Secondary | ICD-10-CM

## 2022-07-11 NOTE — Progress Notes (Addendum)
Subjective:   Lindsay [name] is a 82 y.o. female who presents for Medicare Annual (Subsequent) preventive examination.  Visit Complete: Virtual  I connected with  Lindsay [name] on 07/11/22 by a audio enabled telemedicine application and verified that I am speaking with the correct person using two identifiers.  Patient Location: Home  Provider Location: Office/Clinic  I discussed the limitations of evaluation and management by telemedicine. The patient expressed understanding and agreed to proceed.  Patient Medicare AWV questionnaire was completed by the patient on 07/10/2022; I have confirmed that all information answered by patient is correct and no changes since this date.  Review of Systems     Cardiac Risk Factors include: advanced age (>74men, >23 women);hypertension;obesity (BMI >30kg/m2);dyslipidemia     Objective:    Today's Vitals   07/11/22 1112  Weight: 240 lb (108.9 kg)  Height: 5\' 8"  (1.727 m)   Body mass index is 36.49 kg/m.     07/11/2022   11:25 AM 06/14/2022    6:54 PM 05/21/2022    1:03 PM 07/11/2021    2:47 PM 05/09/2021   10:00 PM 05/09/2021    6:50 PM 05/09/2021   10:53 AM  Advanced Directives  Does Patient Have a Medical Advance Directive? Yes No Yes No  Yes Yes  Type of Estate agent of [place];Living will  Healthcare Power of [employer] Power of [employer] Power of [place];Living will  Does patient want to make changes to medical advance directive?   No - Patient declined  No - Patient declined    Copy of Healthcare Power of Attorney in Chart? No - copy requested     No - copy requested No - copy requested  Would patient like information on creating a medical advance directive?   No - Patient declined No - Patient declined       Current Medications (verified) Outpatient Encounter Medications as of 07/11/2022  Medication Sig   albuterol (PROVENTIL) (5 MG/ML) 0.5% nebulizer solution Take 0.5 mLs by nebulization  every 6 (six) hours as needed for wheezing or shortness of breath.   alendronate (FOSAMAX) 70 MG tablet TAKE 1 TABLET BY MOUTH EVERY SUNDAY   apixaban (ELIQUIS) 5 MG TABS tablet Take 1 tablet (5 mg total) by mouth 2 (two) times daily.   atorvastatin (LIPITOR) 40 MG tablet Take 1 tablet (40 mg total) by mouth daily.   Calcium-Magnesium-Vitamin D (CALCIUM 1200+D3 PO) Take 1 tablet by mouth in the morning.   cetirizine (ZYRTEC) 10 MG tablet TAKE 1 TABLET BY MOUTH EVERY DAY   Fluticasone-Umeclidin-Vilant (TRELEGY ELLIPTA) 100-62.5-25 MCG/ACT AEPB Inhale 1 puff into the lungs daily.   guaiFENesin (MUCINEX) 600 MG 12 hr tablet TAKE 1 TABLET BY MOUTH 2 TIMES DAILY   ipratropium-albuterol (DUONEB) 0.5-2.5 (3) MG/3ML SOLN Inhale 3 mLs into the lungs every 6 (six) hours as needed (wheezing).   LORazepam (ATIVAN) 1 MG tablet TAKE 1 TABLET BY MOUTH 2 TIMES DAILY AS NEEDED FOR ANXIETY   Menthol (COUGH DROPS) 5.8 MG LOZG Use as directed 1 lozenge (5.8 mg total) in the mouth or throat as needed (for dry, sore, or scratchy throat).   metoprolol tartrate (LOPRESSOR) 25 MG tablet Take 25 mg by mouth 2 (two) times daily.   mirtazapine (REMERON) 45 MG tablet TAKE 1 TABLET BY MOUTH AT BEDTIME   Multiple Vitamin (MULTIVITAMIN WITH MINERALS) TABS tablet Take 1 tablet by mouth in the morning.   OXYGEN Inhale 2 L into the lungs daily  as needed (shortness of breath or activity level).   pantoprazole (PROTONIX) 20 MG tablet TAKE 1 TABLET BY MOUTH EVERY DAY   sertraline (ZOLOFT) 100 MG tablet TAKE 1 TABLET BY MOUTH AT BEDTIME   torsemide (DEMADEX) 20 MG tablet TAKE 2 TABLETS DAILY EVERY MORNING. TAKE AN ADDITIONAL 2 TABLETS AS NEEDED FOR WEIGHT GAIN OF 2 POUNDS OVERNIGHT OR 5 POUNDS IN ONE WEEK.   Elastic Bandages & Supports (MEDICAL COMPRESSION STOCKINGS) MISC Two pair knee high 30-19mmHg compression stockings. Sized XL. As covered by insurance for ICD-10: I50.33, R60.0. (Patient not taking: Reported on 07/11/2022)    Phenazopyridine HCl 99.5 MG TABS Take 2 tablets by mouth 3 (three) times daily as needed (urinary pain).   potassium chloride 20 MEQ TBCR Take 20 mEq by mouth daily.   sucralfate (CARAFATE) 1 g tablet Take 1 tablet (1 g total) by mouth 4 (four) times daily -  with meals and at bedtime for 14 days.   No facility-administered encounter medications on file as of 07/11/2022.    Allergies (verified) Acetaminophen-codeine, Naproxen-esomeprazole mg, and Codeine   History: Past Medical History:  Diagnosis Date   Acute diastolic CHF (congestive heart failure) (HCC) 08/03/2020   Acute non-recurrent pansinusitis 10/21/2020   Acute on chronic respiratory failure with hypoxia (HCC) 08/02/2020   Asthma    Asthma exacerbation 08/02/2020   Atrial fibrillation (HCC)    Dysphagia 08/02/2020   Dyspnea 05/09/2021   Elevated troponin 09/14/2020   High cholesterol    HTN (hypertension)    Hyperkalemia 09/14/2020   Hyponatremia 08/02/2020   Low grade squamous intraepithelial lesion on cytologic smear of cervix (LGSIL) 12/02/2019   Moderate persistent asthma without complication 12/02/2019   Respiratory failure with hypoxia (HCC)    Sore throat 11/16/2020   Past Surgical History:  Procedure Laterality Date   CARDIOVERSION N/A 09/13/2020   Procedure: CARDIOVERSION;  Surgeon: Parke Poisson, MD;  Location: [facility] ENDOSCOPY;  Service: Cardiovascular;  Laterality: N/A;   ESOPHAGOGASTRODUODENOSCOPY N/A 08/05/2020   Procedure: ESOPHAGOGASTRODUODENOSCOPY (EGD);  Surgeon: Kathi Der, MD;  Location: [facility] ENDOSCOPY;  Service: Gastroenterology;  Laterality: N/A;   Family History  Problem Relation Age of Onset   Breast cancer Sister    Hypertension Other    Social History   Socioeconomic History   Marital status: Divorced    Spouse name: Not on file   Number of children: Not on file   Years of education: Not on file   Highest education level: Not on file  Occupational History   Not on file   Tobacco Use   Smoking status: Former    Types: Cigarettes   Smokeless tobacco: Never   Tobacco comments:    Quit remotely 1970s  Vaping Use   Vaping Use: Never used  Substance and Sexual Activity   Alcohol use: Not Currently   Drug use: Never   Sexual activity: Not Currently  Other Topics Concern   Not on file  Social History Narrative   Not on file   Social Determinants of Health   Financial Resource Strain: Low Risk  (07/10/2022)   Overall Financial Resource Strain (CARDIA)    Difficulty of Paying Living Expenses: Not very hard  Food Insecurity: No Food Insecurity (07/10/2022)   Hunger Vital Sign    Worried About Running Out of Food in the Last Year: Never true    Ran Out of Food in the Last Year: Never true  Transportation Needs: Unmet Transportation Needs (07/10/2022)   PRAPARE - Transportation  Lack of Transportation (Medical): Yes    Lack of Transportation (Non-Medical): Yes  Physical Activity: Inactive (07/10/2022)   Exercise Vital Sign    Days of Exercise per Week: 0 days    Minutes of Exercise per Session: 0 min  Stress: Stress Concern Present (07/10/2022)   [employer] of Occupational Health - Occupational Stress Questionnaire    Feeling of Stress : To some extent  Social Connections: Unknown (07/10/2022)   Social Connection and Isolation Panel [NHANES]    Frequency of Communication with Friends and Family: Once a week    Frequency of Social Gatherings with Friends and Family: Once a week    Attends Religious Services: Not on Marketing executive or Organizations: No    Attends Banker Meetings: Never    Marital Status: Divorced    Tobacco Counseling Counseling given: Not Answered Tobacco comments: Quit remotely 1970s   Clinical Intake:  Pre-visit preparation completed: Yes  Pain : No/denies pain     Nutritional Status: BMI > 30  Obese Nutritional Risks: None Diabetes: No  How often do you need to have someone help  you when you read instructions, pamphlets, or other written materials from your doctor or pharmacy?: 1 - Never  Interpreter Needed?: No  Information entered by :: NAllen LPN   Activities of Daily Living    07/10/2022   11:22 AM  In your present state of health, do you have any difficulty performing the following activities:  Hearing? 1  Comment no hearing aids  Vision? 0  Difficulty concentrating or making decisions? 0  Walking or climbing stairs? 1  Dressing or bathing? 0  Doing errands, shopping? 1  Comment daughter goes with, does not drive  Quarry manager and eating ? N  Using the Toilet? N  In the past six months, have you accidently leaked urine? Y  Do you have problems with loss of bowel control? N  Managing your Medications? N  Managing your Finances? N  Housekeeping or managing your Housekeeping? N    Patient Care Team: Early, Sung Amabile, NP as PCP - General (Nurse Practitioner) Chrystie Nose, MD as PCP - Cardiology (Cardiology) Clarene Duke, Karma Lew, RN as [facility] Care Management  Indicate any recent Medical Services you may have received from other than [facility] providers in the past year (date may be approximate).     Assessment:   This is a routine wellness examination for Nekeshia.  Hearing/Vision screen Vision Screening - Comments:: Regular eye exams,   Dietary issues and exercise activities discussed:     Goals Addressed             This Visit's Progress    Patient Stated       07/11/2022, work on edurance       Depression Screen    07/11/2022   11:27 AM 05/26/2022    9:43 AM 10/21/2020    9:48 AM  PHQ 2/9 Scores  PHQ - 2 Score 0 0 2  PHQ- 9 Score 0  11    Fall Risk    07/10/2022   11:22 AM 05/26/2022    9:43 AM 10/21/2020    9:48 AM  Fall Risk   Falls in the past year? 0 0 0  Number falls in past yr: 0 0 0  Injury with Fall? 0 0 0  Risk for fall due to : Medication side effect;Impaired mobility;Impaired balance/gait No Fall Risks  Impaired mobility  Follow  up Falls prevention discussed;Education provided;Falls evaluation completed Falls evaluation completed Falls evaluation completed    MEDICARE RISK AT HOME:  Medicare Risk at Home - 07/11/22 1126     Any stairs in or around the home? No    If so, are there any without handrails? No    Home free of loose throw rugs in walkways, pet beds, electrical cords, etc? Yes    Adequate lighting in your home to reduce risk of falls? Yes    Life alert? Yes    Use of a cane, walker or w/c? Yes    Grab bars in the bathroom? Yes    Shower chair or bench in shower? Yes    Elevated toilet seat or a handicapped toilet? Yes             TIMED UP AND GO:  Was the test performed?  No    Cognitive Function:        07/11/2022   11:28 AM  6CIT Screen  What Year? 0 points  What month? 0 points  What time? 0 points  Count back from 20 0 points  Months in reverse 0 points  Repeat phrase 0 points  Total Score 0 points    Immunizations Immunization History  Administered Date(s) Administered   DT (Pediatric) 07/23/1992, 09/09/2007   DTaP 08/06/1992   Influenza Nasal 11/25/2014   Influenza Split 11/21/1996, 12/06/2004, 12/12/2004, 11/23/2005, 11/28/2005, 10/24/2006, 11/29/2006, 10/24/2007, 01/09/2008, 10/23/2008, 11/09/2008, 10/14/2009, 11/03/2009, 10/23/2012   Influenza, High Dose Seasonal PF 11/03/2012, 10/15/2013, 10/19/2013, 10/28/2018, 10/04/2019   Influenza, Seasonal, Injecte, Preservative Fre 10/25/2010   Influenza,inj,Quad PF,6+ Mos 11/10/2011   Influenza,trivalent, recombinat, inj, PF 10/21/2015, 10/26/2016, 10/30/2017   Influenza-Unspecified 11/16/1999, 11/13/2000, 11/22/2001, 12/10/2003, 10/26/2016   Moderna SARS-COV2 Booster Vaccination 02/22/2020   Moderna Sars-Covid-2 Vaccination 02/14/2019, 03/14/2019   Pneumococcal Conjugate-13 07/10/2013, 02/05/2020   Pneumococcal Polysaccharide-23 04/09/2006, 02/18/2021   Td 07/23/1992, 09/09/2007, 10/21/2020   Td  (Adult),5 Lf Tetanus Toxid, Preservative Free 08/06/1992   Td (Adult),unspecified 09/09/2007   Zoster Recombinat (Shingrix) 04/12/2020, 01/04/2021   Zoster, Live 02/23/2012    TDAP status: Up to date  Flu Vaccine status: Up to date  Pneumococcal vaccine status: Up to date  Covid-19 vaccine status: Completed vaccines  Qualifies for Shingles Vaccine? Yes   Zostavax completed Yes   Shingrix Completed?: Yes  Screening Tests Health Maintenance  Topic Date Due   DEXA SCAN  Never done   COVID-19 Vaccine (3 - Moderna risk series) 03/21/2020   Medicare Annual Wellness (AWV)  02/04/2021   INFLUENZA VACCINE  08/24/2022   DTaP/Tdap/Td (9 - Tdap) 10/22/2030   Pneumonia Vaccine 40+ Years old  Completed   Zoster Vaccines- Shingrix  Completed   HPV VACCINES  Aged Out    Health Maintenance  Health Maintenance Due  Topic Date Due   DEXA SCAN  Never done   COVID-19 Vaccine (3 - Moderna risk series) 03/21/2020   Medicare Annual Wellness (AWV)  02/04/2021    Colorectal cancer screening: No longer required.   Mammogram status: No longer required due to age.  Bone Density status: ordered today  Lung Cancer Screening: (Low Dose CT Chest recommended if Age 73-80 years, 20 pack-year currently smoking OR have quit w/in 15years.) does not qualify.   Lung Cancer Screening Referral: no  Additional Screening:  Hepatitis C Screening: does not qualify;   Vision Screening: Recommended annual ophthalmology exams for early detection of glaucoma and other disorders of the eye. Is the patient up to date  with their annual eye exam?  Yes  Who is the provider or what is the name of the office in which the patient attends annual eye exams? Can't remember name If pt is not established with a provider, would they like to be referred to a provider to establish care? No .   Dental Screening: Recommended annual dental exams for proper oral hygiene  Diabetic Foot Exam: n/a  Community Resource Referral  / Chronic Care Management: CRR required this visit?  No   CCM required this visit?  No     Plan:     I have personally reviewed and noted the following in the patient's chart:   Medical and social history Use of alcohol, tobacco or illicit drugs  Current medications and supplements including opioid prescriptions. Patient is not currently taking opioid prescriptions. Functional ability and status Nutritional status Physical activity Advanced directives List of other physicians Hospitalizations, surgeries, and ER visits in previous 12 months Vitals Screenings to include cognitive, depression, and falls Referrals and appointments  In addition, I have reviewed and discussed with patient certain preventive protocols, quality metrics, and best practice recommendations. A written personalized care plan for preventive services as well as general preventive health recommendations were provided to patient.     Barb Merino, LPN   [id number]/61/[id number]   After Visit Summary: (MyChart) Due to this being a telephonic visit, the after visit summary with patients personalized plan was offered to patient via MyChart   Nurse Notes: none

## 2022-07-11 NOTE — Patient Instructions (Addendum)
Lindsay [name] , Thank you for taking time to come for your Medicare Wellness Visit. I appreciate your ongoing commitment to your health goals. Please review the following plan we discussed and let me know if I can assist you in the future.   These are the goals we discussed:  Goals      Continue Eliquis to help prevent stroke     Care Coordination Interventions: Counseled on increased risk of stroke due to Afib and benefits of anticoagulation for stroke prevention Reviewed importance of adherence to anticoagulant exactly as prescribed Assessed social determinant of health barriers        Patient Stated     07/11/2022, work on [employer]     To get shortness of breath under control     Care Coordination Interventions: Provided patient with basic written and verbal COPD education on self care/management/and exacerbation prevention Advised patient to track and manage COPD triggers Provided written and verbal instructions on pursed lip breathing and utilized returned demonstration as teach back        To lower my LDL     Care Coordination Interventions: Provider established cholesterol goals reviewed Reviewed role and benefits of statin for ASCVD risk reduction Reviewed importance of limiting foods high in cholesterol Mailed printed educational materials related to Cholesterol management            This is a list of the screening recommended for you and due dates:  Health Maintenance  Topic Date Due   DEXA scan (bone density measurement)  Never done   COVID-19 Vaccine (3 - Moderna risk series) 03/21/2020   Flu Shot  08/24/2022   Medicare Annual Wellness Visit  07/11/2023   DTaP/Tdap/Td vaccine (9 - Tdap) 10/22/2030   Pneumonia Vaccine  Completed   Zoster (Shingles) Vaccine  Completed   HPV Vaccine  Aged Out    Advanced directives: Please bring a copy of your POA (Power of [place]) and/or Living Will to your next appointment.   Conditions/risks identified: none  Next  appointment: Follow up in one year for your annual wellness visit   You have an order for:  []   2D Mammogram  []   3D Mammogram  [x]   Bone Density     Please call for appointment:      [facility] [address] [place], [place] [address] [phone]   Make sure to wear two-piece clothing.  No lotions powders or deodorants the day of the appointment Make sure to bring picture ID and insurance card.  Bring list of medications you are currently taking including any supplements.        Preventive Care 44 Years and Older, Female Preventive care refers to lifestyle choices and visits with your health care provider that can promote health and wellness. What does preventive care include? A yearly physical exam. This is also called an annual well check. Dental exams once or twice a year. Routine eye exams. Ask your health care provider how often you should have your eyes checked. Personal lifestyle choices, including: Daily care of your teeth and gums. Regular physical activity. Eating a healthy diet. Avoiding tobacco and drug use. Limiting alcohol use. Practicing safe sex. Taking low-dose aspirin every day. Taking vitamin and mineral supplements as recommended by your health care provider. What happens during an annual well check? The services and screenings done by your health care provider during your annual well check will depend on your age, overall health, lifestyle risk factors, and family history  of disease. Counseling  Your health care provider may ask you questions about your: Alcohol use. Tobacco use. Drug use. Emotional well-being. Home and relationship well-being. Sexual activity. Eating habits. History of falls. Memory and ability to understand (cognition). Work and work Astronomer. Reproductive health. Screening  You may have the following tests or measurements: Height, weight, and BMI. Blood pressure. Lipid and cholesterol  levels. These may be checked every 5 years, or more frequently if you are over 43 years old. Skin check. Lung cancer screening. You may have this screening every year starting at age 56 if you have a 30-pack-year history of smoking and currently smoke or have quit within the past 15 years. Fecal occult blood test (FOBT) of the stool. You may have this test every year starting at age 46. Flexible sigmoidoscopy or colonoscopy. You may have a sigmoidoscopy every 5 years or a colonoscopy every 10 years starting at age 60. Hepatitis C blood test. Hepatitis B blood test. Sexually transmitted disease (STD) testing. Diabetes screening. This is done by checking your blood sugar (glucose) after you have not eaten for a while (fasting). You may have this done every 1-3 years. Bone density scan. This is done to screen for osteoporosis. You may have this done starting at age 68. Mammogram. This may be done every 1-2 years. Talk to your health care provider about how often you should have regular mammograms. Talk with your health care provider about your test results, treatment options, and if necessary, the need for more tests. Vaccines  Your health care provider may recommend certain vaccines, such as: Influenza vaccine. This is recommended every year. Tetanus, diphtheria, and acellular pertussis (Tdap, Td) vaccine. You may need a Td booster every 10 years. Zoster vaccine. You may need this after age 1. Pneumococcal 13-valent conjugate (PCV13) vaccine. One dose is recommended after age 1. Pneumococcal polysaccharide (PPSV23) vaccine. One dose is recommended after age 56. Talk to your health care provider about which screenings and vaccines you need and how often you need them. This information is not intended to replace advice given to you by your health care provider. Make sure you discuss any questions you have with your health care provider. Document Released: 02/05/2015 Document Revised: 09/29/2015  Document Reviewed: 11/10/2014 Elsevier Interactive Patient Education  2017 [employer].  Fall Prevention in the Home Falls can cause injuries. They can happen to people of all ages. There are many things you can do to make your home safe and to help prevent falls. What can I do on the outside of my home? Regularly fix the edges of walkways and driveways and fix any cracks. Remove anything that might make you trip as you walk through a door, such as a raised step or threshold. Trim any bushes or trees on the path to your home. Use bright outdoor lighting. Clear any walking paths of anything that might make someone trip, such as rocks or tools. Regularly check to see if handrails are loose or broken. Make sure that both sides of any steps have handrails. Any raised decks and porches should have guardrails on the edges. Have any leaves, snow, or ice cleared regularly. Use sand or salt on walking paths during winter. Clean up any spills in your garage right away. This includes oil or grease spills. What can I do in the bathroom? Use night lights. Install grab bars by the toilet and in the tub and shower. Do not use towel bars as grab bars. Use non-skid mats or  decals in the tub or shower. If you need to sit down in the shower, use a plastic, non-slip stool. Keep the floor dry. Clean up any water that spills on the floor as soon as it happens. Remove soap buildup in the tub or shower regularly. Attach bath mats securely with double-sided non-slip rug tape. Do not have throw rugs and other things on the floor that can make you trip. What can I do in the bedroom? Use night lights. Make sure that you have a light by your bed that is easy to reach. Do not use any sheets or blankets that are too big for your bed. They should not hang down onto the floor. Have a firm chair that has side arms. You can use this for support while you get dressed. Do not have throw rugs and other things on the floor  that can make you trip. What can I do in the kitchen? Clean up any spills right away. Avoid walking on wet floors. Keep items that you use a lot in easy-to-reach places. If you need to reach something above you, use a strong step stool that has a grab bar. Keep electrical cords out of the way. Do not use floor polish or wax that makes floors slippery. If you must use wax, use non-skid floor wax. Do not have throw rugs and other things on the floor that can make you trip. What can I do with my stairs? Do not leave any items on the stairs. Make sure that there are handrails on both sides of the stairs and use them. Fix handrails that are broken or loose. Make sure that handrails are as long as the stairways. Check any carpeting to make sure that it is firmly attached to the stairs. Fix any carpet that is loose or worn. Avoid having throw rugs at the top or bottom of the stairs. If you do have throw rugs, attach them to the floor with carpet tape. Make sure that you have a light switch at the top of the stairs and the bottom of the stairs. If you do not have them, ask someone to add them for you. What else can I do to help prevent falls? Wear shoes that: Do not have high heels. Have rubber bottoms. Are comfortable and fit you well. Are closed at the toe. Do not wear sandals. If you use a stepladder: Make sure that it is fully opened. Do not climb a closed stepladder. Make sure that both sides of the stepladder are locked into place. Ask someone to hold it for you, if possible. Clearly mark and make sure that you can see: Any grab bars or handrails. First and last steps. Where the edge of each step is. Use tools that help you move around (mobility aids) if they are needed. These include: Canes. Walkers. Scooters. Crutches. Turn on the lights when you go into a dark area. Replace any light bulbs as soon as they burn out. Set up your furniture so you have a clear path. Avoid moving your  furniture around. If any of your floors are uneven, fix them. If there are any pets around you, be aware of where they are. Review your medicines with your doctor. Some medicines can make you feel dizzy. This can increase your chance of falling. Ask your doctor what other things that you can do to help prevent falls. This information is not intended to replace advice given to you by your health care provider. Make sure  you discuss any questions you have with your health care provider. Document Released: 11/05/2008 Document Revised: 06/17/2015 Document Reviewed: 02/13/2014 Elsevier Interactive Patient Education  2017 [employer].

## 2022-07-12 ENCOUNTER — Ambulatory Visit (HOSPITAL_COMMUNITY): Payer: Medicare HMO

## 2022-07-12 ENCOUNTER — Encounter (HOSPITAL_COMMUNITY): Payer: Self-pay

## 2022-07-12 DIAGNOSIS — L989 Disorder of the skin and subcutaneous tissue, unspecified: Secondary | ICD-10-CM

## 2022-07-12 DIAGNOSIS — R2689 Other abnormalities of gait and mobility: Secondary | ICD-10-CM

## 2022-07-12 NOTE — Therapy (Signed)
OUTPATIENT PHYSICAL THERAPY WOUND EVALUATION   [name] MRN: [id number] DOB:May 08, 1940, 82 y.o., female Today's Date: 07/12/2022   PCP: Tollie Eth, NP REFERRING PROVIDER: Tollie Eth, NP  END OF SESSION:  PT End of Session - 07/12/22 0837     Visit Number 3    Number of Visits 12    Date for PT Re-Evaluation 08/07/22    Authorization Type [employer]    Authorization Time Period 12 visits and 1 reeval approved 6/3 -7/15    Progress Note Due on Visit 10    PT Start Time 0800    PT Stop Time 0828    PT Time Calculation (min) 28 min    Activity Tolerance Patient tolerated treatment well    Behavior During Therapy [facility] for tasks assessed/performed             Past Medical History:  Diagnosis Date   Acute diastolic CHF (congestive heart failure) (HCC) 08/03/2020   Acute non-recurrent pansinusitis 10/21/2020   Acute on chronic respiratory failure with hypoxia (HCC) 08/02/2020   Asthma    Asthma exacerbation 08/02/2020   Atrial fibrillation (HCC)    Dysphagia 08/02/2020   Dyspnea 05/09/2021   Elevated troponin 09/14/2020   High cholesterol    HTN (hypertension)    Hyperkalemia 09/14/2020   Hyponatremia 08/02/2020   Low grade squamous intraepithelial lesion on cytologic smear of cervix (LGSIL) 12/02/2019   Moderate persistent asthma without complication 12/02/2019   Respiratory failure with hypoxia (HCC)    Sore throat 11/16/2020   Past Surgical History:  Procedure Laterality Date   CARDIOVERSION N/A 09/13/2020   Procedure: CARDIOVERSION;  Surgeon: Parke Poisson, MD;  Location: [facility] ENDOSCOPY;  Service: Cardiovascular;  Laterality: N/A;   ESOPHAGOGASTRODUODENOSCOPY N/A 08/05/2020   Procedure: ESOPHAGOGASTRODUODENOSCOPY (EGD);  Surgeon: Kathi Der, MD;  Location: [facility] ENDOSCOPY;  Service: Gastroenterology;  Laterality: N/A;   Patient Active Problem List   Diagnosis Date Noted   Sebaceous cyst 05/26/2022   Subacute cough 05/26/2022    Bilateral hearing loss 05/11/2022   Essential hypertension 05/11/2021   GERD (gastroesophageal reflux disease) 05/09/2021   HLD (hyperlipidemia) 05/09/2021   Hyperglycemia 05/09/2021   Bilateral lower extremity edema 03/09/2021   Physical deconditioning 02/18/2021   COPD (chronic obstructive pulmonary disease) (HCC) 11/11/2020   GAD (generalized anxiety disorder) 10/21/2020   UTI symptoms 10/21/2020   Urinary incontinence in female 09/28/2020   Chronic CHF (congestive heart failure) (HCC) 09/14/2020   Achalasia of esophagus 08/06/2020   Chronic respiratory failure with hypoxia (HCC) 08/02/2020   Hypokalemia 08/02/2020   Chronic anticoagulation 08/02/2020   Acquired thrombophilia (HCC) 08/02/2020   Persistent atrial fibrillation (HCC) 08/02/2020   Age-related osteoporosis without current pathological fracture 01/15/2020   History of compression fracture of spine 12/02/2019   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   Morbid obesity with body mass index (BMI) of 40.0 or higher (HCC) 12/02/2019   Oxygen dependent 12/02/2019   Recurrent major depressive disorder, in partial remission (HCC) 12/02/2019   Vitamin D deficiency 12/02/2019   Second degree atrioventricular block 03/17/2014   Dyspnea on exertion 08/31/2005   Mitral valve stenosis 06/13/2004    ONSET DATE: May 2024  REFERRING DIAG: L98.9 (ICD-10-CM) - Sore on leg  THERAPY DIAG:  Other abnormalities of gait and mobility  Sore on leg  Rationale for Evaluation and Treatment: Rehabilitation     Wound Therapy - 07/12/22 0001     Subjective Pt removed dressings and arrived  wiht bandaid and ace bandage on LE, reports it was itchy.  Pt stated it's difficult to make it to therapy as her daughter has to work, had to get [employer] this morning and lives in [place].  Pt stated she is intersted in home health.  (Secretary told me later that transportation is availabe at the facility she lives at).     Patient and Family Stated Goals wound to heal    Date of Onset 05/29/22    Prior Treatments has tried betadine, neosporin    Pain Scale 0-10    Pain Score 0-No pain    Evaluation and Treatment Procedures Explained to Patient/Family Yes    Evaluation and Treatment Procedures agreed to    Wound Properties Date First Assessed: 06/26/22 Time First Assessed: 1040 Wound Type: Skin tear Location: Pretibial Location Orientation: Left;Anterior Present on Admission: Yes   Wound Image Images linked: 1    Dressing Type Impregnated gauze (bismuth);Gauze (Comment);Compression wrap    Dressing Changed Changed    Dressing Status Old drainage    Dressing Change Frequency PRN    Site / Wound Assessment Granulation tissue    % Wound base Red or Granulating 100%    Peri-wound Assessment Edema    Wound Length (cm) 2.7 cm    Wound Width (cm) 2.8 cm    Wound Surface Area (cm^2) 7.56 cm^2    Margins Unattached edges (unapproximated)    Drainage Amount Minimal    Drainage Description Serosanguineous    Treatment Cleansed;Debridement (Selective)    Selective Debridement (non-excisional) - Location Left anterior tibial wound    Selective Debridement (non-excisional) - Tools Used Forceps;Scalpel;Scissors    Selective Debridement (non-excisional) - Tissue Removed slough, peeling skin    Wound Therapy - Clinical Statement see below    Wound Therapy - Functional Problem List bathing, dressing    Factors Delaying/Impairing Wound Healing Multiple medical problems;Immobility    Hydrotherapy Plan Debridement;Dressing change;Electrical stimulation;Patient/family education;Pulsatile lavage with suction;Ultrasonic wound therapy @35  KHz (+/- 3 KHz)    Wound Therapy - Frequency 2X / week    Wound Therapy - Current Recommendations PT    Wound Plan debride and dressing changes PRN    Dressing  vaseline to perimeter, xeroform on 4x4, abd pads at ankle, kerlix, coban, #5 netting               PATIENT  EDUCATION: Education details: Patient educated on exam findings, POC, dressing changes if soiled, removal of coban layer if painful Person educated: Patient Education method: Explanation Education comprehension: verbalized understanding   HOME EXERCISE PROGRAM: N/a   GOALS: Goals reviewed with patient? Yes  SHORT TERM GOALS: Target date: 07/17/2022    Wound to be fully granulated to demonstrate wound healing.  Baseline: Goal status: INITIAL   LONG TERM GOALS: Target date: 08/07/2022    Wound to be healed to reduce risk of infection. Baseline:  Goal status: INITIAL    ASSESSMENT:  CLINICAL IMPRESSION: Pt arrived with ace wrap and band aid that did not allow any air, wound macerated perimeter.  Selective debridement for removal of slough from wound bed and devitalized tissue perimeter to promoted healing.  LE well cleansed and moisturized prior reapplication of xeroform and kerlix, coban to address edema present LE.   Pt stated transportation is difficult for her as her daughter has to work and had to get [place] from [place] this morning, requesting home health.  Secretary informed later that transportation is available at facility she is living at.  OBJECTIVE IMPAIRMENTS: Abnormal gait, decreased activity tolerance, decreased balance, decreased mobility, difficulty walking, increased edema, pain, and skin integrity .   ACTIVITY LIMITATIONS: bathing, dressing, and locomotion level  PARTICIPATION LIMITATIONS: meal prep, cleaning, laundry, and community activity  PERSONAL FACTORS: Age and 3+ comorbidities: CHF, afib, HTN, HLD  are also affecting patient's functional outcome.   REHAB POTENTIAL: Good  CLINICAL DECISION MAKING: Evolving/moderate complexity  EVALUATION COMPLEXITY: Moderate  PLAN: PT FREQUENCY: 2x/week  PT DURATION: 6 weeks  PLANNED INTERVENTIONS: Therapeutic exercises, Therapeutic activity, Neuromuscular re-education, Balance training, Gait  training, Patient/Family education, Joint manipulation, Joint mobilization, Stair training, Orthotic/Fit training, DME instructions, Aquatic Therapy, Dry Needling, Electrical stimulation, Spinal manipulation, Spinal mobilization, Cryotherapy, Moist heat, Compression bandaging, scar mobilization, Splintting, Taping, Traction, Ultrasound, Ionotophoresis 4mg /ml Dexamethasone, and Manual therapy  PLAN FOR NEXT SESSION: debride and dressing changes PRN, possibly transition to home health for wound care once debridement is no longer needed as transportation is an issue for patient  Becky Sax, LPTA/CLT; CBIS [phone]  Juel Burrow, PTA 07/12/2022, 8:37 AM

## 2022-07-14 ENCOUNTER — Other Ambulatory Visit: Payer: Self-pay | Admitting: Nurse Practitioner

## 2022-07-14 ENCOUNTER — Ambulatory Visit (HOSPITAL_COMMUNITY): Payer: Medicare HMO

## 2022-07-18 ENCOUNTER — Ambulatory Visit (HOSPITAL_COMMUNITY): Payer: Medicare HMO

## 2022-07-18 ENCOUNTER — Encounter (HOSPITAL_COMMUNITY): Payer: Self-pay

## 2022-07-18 DIAGNOSIS — R2689 Other abnormalities of gait and mobility: Secondary | ICD-10-CM

## 2022-07-18 DIAGNOSIS — L989 Disorder of the skin and subcutaneous tissue, unspecified: Secondary | ICD-10-CM

## 2022-07-18 NOTE — Therapy (Signed)
OUTPATIENT PHYSICAL THERAPY WOUND EVALUATION   [name] MRN: [id number] DOB:30-Sep-1940, 82 y.o., female Today's Date: 07/18/2022   PCP: Tollie Eth, NP REFERRING PROVIDER: Tollie Eth, NP  END OF SESSION:  PT End of Session - 07/18/22 1145     Visit Number 4    Number of Visits 12    Date for PT Re-Evaluation 08/07/22    Authorization Type [employer]    Authorization Time Period 12 visits and 1 reeval approved 6/3 -7/15    Progress Note Due on Visit 10    PT Start Time 1051    PT Stop Time 1125    PT Time Calculation (min) 34 min    Activity Tolerance Patient tolerated treatment well    Behavior During Therapy [facility] for tasks assessed/performed             Past Medical History:  Diagnosis Date   Acute diastolic CHF (congestive heart failure) (HCC) 08/03/2020   Acute non-recurrent pansinusitis 10/21/2020   Acute on chronic respiratory failure with hypoxia (HCC) 08/02/2020   Asthma    Asthma exacerbation 08/02/2020   Atrial fibrillation (HCC)    Dysphagia 08/02/2020   Dyspnea 05/09/2021   Elevated troponin 09/14/2020   High cholesterol    HTN (hypertension)    Hyperkalemia 09/14/2020   Hyponatremia 08/02/2020   Low grade squamous intraepithelial lesion on cytologic smear of cervix (LGSIL) 12/02/2019   Moderate persistent asthma without complication 12/02/2019   Respiratory failure with hypoxia (HCC)    Sore throat 11/16/2020   Past Surgical History:  Procedure Laterality Date   CARDIOVERSION N/A 09/13/2020   Procedure: CARDIOVERSION;  Surgeon: Parke Poisson, MD;  Location: [facility] ENDOSCOPY;  Service: Cardiovascular;  Laterality: N/A;   ESOPHAGOGASTRODUODENOSCOPY N/A 08/05/2020   Procedure: ESOPHAGOGASTRODUODENOSCOPY (EGD);  Surgeon: Kathi Der, MD;  Location: [facility] ENDOSCOPY;  Service: Gastroenterology;  Laterality: N/A;   Patient Active Problem List   Diagnosis Date Noted   Sebaceous cyst 05/26/2022   Subacute cough 05/26/2022    Bilateral hearing loss 05/11/2022   Essential hypertension 05/11/2021   GERD (gastroesophageal reflux disease) 05/09/2021   HLD (hyperlipidemia) 05/09/2021   Hyperglycemia 05/09/2021   Bilateral lower extremity edema 03/09/2021   Physical deconditioning 02/18/2021   COPD (chronic obstructive pulmonary disease) (HCC) 11/11/2020   GAD (generalized anxiety disorder) 10/21/2020   UTI symptoms 10/21/2020   Urinary incontinence in female 09/28/2020   Chronic CHF (congestive heart failure) (HCC) 09/14/2020   Achalasia of esophagus 08/06/2020   Chronic respiratory failure with hypoxia (HCC) 08/02/2020   Hypokalemia 08/02/2020   Chronic anticoagulation 08/02/2020   Acquired thrombophilia (HCC) 08/02/2020   Persistent atrial fibrillation (HCC) 08/02/2020   Age-related osteoporosis without current pathological fracture 01/15/2020   History of compression fracture of spine 12/02/2019   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   Morbid obesity with body mass index (BMI) of 40.0 or higher (HCC) 12/02/2019   Oxygen dependent 12/02/2019   Recurrent major depressive disorder, in partial remission (HCC) 12/02/2019   Vitamin D deficiency 12/02/2019   Second degree atrioventricular block 03/17/2014   Dyspnea on exertion 08/31/2005   Mitral valve stenosis 06/13/2004    ONSET DATE: May 2024  REFERRING DIAG: L98.9 (ICD-10-CM) - Sore on leg  THERAPY DIAG:  Other abnormalities of gait and mobility  Sore on leg  Rationale for Evaluation and Treatment: Rehabilitation   Wound Therapy - 07/18/22 0001     Subjective Pt arrived wiht dressings intact, no reports  of pain does c/o itching.    Patient and Family Stated Goals wound to heal    Date of Onset 05/29/22    Prior Treatments has tried betadine, neosporin    Pain Scale 0-10    Pain Score 0-No pain    Evaluation and Treatment Procedures Explained to Patient/Family Yes    Evaluation and Treatment Procedures agreed  to    Wound Properties Date First Assessed: 06/26/22 Time First Assessed: 1040 Wound Type: Skin tear Location: Pretibial Location Orientation: Left;Anterior Present on Admission: Yes   Wound Image Images linked: 1    Dressing Type Impregnated gauze (bismuth);Gauze (Comment);Compression wrap    Dressing Changed Changed    Dressing Status Old drainage    Dressing Change Frequency PRN    Site / Wound Assessment Granulation tissue    % Wound base Red or Granulating 100%    Peri-wound Assessment Edema    Wound Length (cm) 2 cm    Wound Width (cm) 2 cm    Wound Surface Area (cm^2) 4 cm^2    Margins Unattached edges (unapproximated)    Drainage Amount Minimal    Drainage Description Serosanguineous    Treatment Cleansed;Debridement (Selective)    Selective Debridement (non-excisional) - Location Left anterior tibial wound    Selective Debridement (non-excisional) - Tools Used Forceps;Scalpel;Scissors    Selective Debridement (non-excisional) - Tissue Removed slough, peeling skin    Wound Therapy - Clinical Statement see below    Wound Therapy - Functional Problem List bathing, dressing    Factors Delaying/Impairing Wound Healing Multiple medical problems;Immobility    Hydrotherapy Plan Debridement;Dressing change;Electrical stimulation;Patient/family education;Pulsatile lavage with suction;Ultrasonic wound therapy @35  KHz (+/- 3 KHz)    Wound Therapy - Frequency 2X / week    Wound Therapy - Current Recommendations PT    Wound Plan debride and dressing changes PRN    Dressing  vaseline to perimeter, xeroform on 4x4, foam, medipore tape, abd pads and cotton for shape at ankle, kerlix, coban, #5 netting                 PATIENT EDUCATION: Education details: Patient educated on exam findings, POC, dressing changes if soiled, removal of coban layer if painful Person educated: Patient Education method: Explanation Education comprehension: verbalized understanding   HOME EXERCISE  PROGRAM: N/a   GOALS: Goals reviewed with patient? Yes  SHORT TERM GOALS: Target date: 07/17/2022    Wound to be fully granulated to demonstrate wound healing.  Baseline: Goal status: INITIAL   LONG TERM GOALS: Target date: 08/07/2022    Wound to be healed to reduce risk of infection. Baseline:  Goal status: INITIAL    ASSESSMENT:  CLINICAL IMPRESSION: Wound is approximating nicely with reduction in overall size.  Noted wound bed hypergranulated, added foam with medipore tape.  Selective debridement for removal of slough from wound bed and dry skin perimeter of wound.  Dressings have slid down some, added ABD pad and cotton for cone shape to assist with sliding down.  Continued with xeroform, gauze, kerlix and coban with netting.  Reports of comfort at [facility].  Pt stated transportation continues to be difficult, stated transportation asked if a wound facility closer to [facility].  Secretary informed there is transportation available at facility she is living at.    OBJECTIVE IMPAIRMENTS: Abnormal gait, decreased activity tolerance, decreased balance, decreased mobility, difficulty walking, increased edema, pain, and skin integrity .   ACTIVITY LIMITATIONS: bathing, dressing, and locomotion level  PARTICIPATION LIMITATIONS: meal prep, cleaning,  laundry, and community activity  PERSONAL FACTORS: Age and 3+ comorbidities: CHF, afib, HTN, HLD  are also affecting patient's functional outcome.   REHAB POTENTIAL: Good  CLINICAL DECISION MAKING: Evolving/moderate complexity  EVALUATION COMPLEXITY: Moderate  PLAN: PT FREQUENCY: 2x/week  PT DURATION: 6 weeks  PLANNED INTERVENTIONS: Therapeutic exercises, Therapeutic activity, Neuromuscular re-education, Balance training, Gait training, Patient/Family education, Joint manipulation, Joint mobilization, Stair training, Orthotic/Fit training, DME instructions, Aquatic Therapy, Dry Needling, Electrical stimulation, Spinal manipulation,  Spinal mobilization, Cryotherapy, Moist heat, Compression bandaging, scar mobilization, Splintting, Taping, Traction, Ultrasound, Ionotophoresis 4mg /ml Dexamethasone, and Manual therapy  PLAN FOR NEXT SESSION: debride and dressing changes PRN, possibly transition to home health for wound care once debridement is no longer needed as transportation is an issue for patient  Becky Sax, LPTA/CLT; CBIS [phone]  Juel Burrow, PTA 07/18/2022, 11:51 AM

## 2022-07-21 ENCOUNTER — Ambulatory Visit: Payer: Self-pay

## 2022-07-21 ENCOUNTER — Ambulatory Visit (HOSPITAL_COMMUNITY): Payer: Medicare HMO

## 2022-07-21 ENCOUNTER — Encounter (HOSPITAL_COMMUNITY): Payer: Self-pay

## 2022-07-21 ENCOUNTER — Ambulatory Visit (HOSPITAL_COMMUNITY): Payer: Medicare HMO | Admitting: Physical Therapy

## 2022-07-21 ENCOUNTER — Telehealth: Payer: Self-pay | Admitting: Nurse Practitioner

## 2022-07-21 DIAGNOSIS — L989 Disorder of the skin and subcutaneous tissue, unspecified: Secondary | ICD-10-CM

## 2022-07-21 DIAGNOSIS — R2689 Other abnormalities of gait and mobility: Secondary | ICD-10-CM | POA: Diagnosis not present

## 2022-07-21 NOTE — Telephone Encounter (Signed)
Lindsay [name] called about Lindsay [name]. She says Lindsay [name] has been going to [name] for her wound that has been healing and is much smaller than it was but she is running into a transportation problem with getting to [name]. She is asking if a referral can be put in for [name] since it has gotten much smaller. She says she doesn't want the referral to be through [name] because last time they told her they dont take care of wounds. Lindsay [name] says she knows for sure [name] will take care of her wound if you think they are a good fit. Lindsay [name] asks if she can be called and updated on where Lindsay [name] is referred. She is a Lindsay [name] and works with [name] through chronic issues. She provided a call back number [phone].

## 2022-07-21 NOTE — Therapy (Signed)
OUTPATIENT PHYSICAL THERAPY WOUND TREATMENT   [name] MRN: [id number] DOB:October 05, 1940, 82 y.o., female Today's Date: 07/21/2022   PCP: Tollie Eth, NP REFERRING PROVIDER: Tollie Eth, NP  END OF SESSION:  PT End of Session - 07/21/22 1613     Visit Number 5    Number of Visits 12    Date for PT Re-Evaluation 08/07/22    Authorization Type [employer]    Authorization Time Period 12 visits and 1 reeval approved 6/3 -7/15    Progress Note Due on Visit 10    PT Start Time 1530    PT Stop Time 1608    PT Time Calculation (min) 38 min    Activity Tolerance Patient tolerated treatment well    Behavior During Therapy [facility] for tasks assessed/performed             Past Medical History:  Diagnosis Date   Acute diastolic CHF (congestive heart failure) (HCC) 08/03/2020   Acute non-recurrent pansinusitis 10/21/2020   Acute on chronic respiratory failure with hypoxia (HCC) 08/02/2020   Asthma    Asthma exacerbation 08/02/2020   Atrial fibrillation (HCC)    Dysphagia 08/02/2020   Dyspnea 05/09/2021   Elevated troponin 09/14/2020   High cholesterol    HTN (hypertension)    Hyperkalemia 09/14/2020   Hyponatremia 08/02/2020   Low grade squamous intraepithelial lesion on cytologic smear of cervix (LGSIL) 12/02/2019   Moderate persistent asthma without complication 12/02/2019   Respiratory failure with hypoxia (HCC)    Sore throat 11/16/2020   Past Surgical History:  Procedure Laterality Date   CARDIOVERSION N/A 09/13/2020   Procedure: CARDIOVERSION;  Surgeon: Parke Poisson, MD;  Location: [facility] ENDOSCOPY;  Service: Cardiovascular;  Laterality: N/A;   ESOPHAGOGASTRODUODENOSCOPY N/A 08/05/2020   Procedure: ESOPHAGOGASTRODUODENOSCOPY (EGD);  Surgeon: Kathi Der, MD;  Location: [facility] ENDOSCOPY;  Service: Gastroenterology;  Laterality: N/A;   Patient Active Problem List   Diagnosis Date Noted   Sebaceous cyst 05/26/2022   Subacute cough 05/26/2022    Bilateral hearing loss 05/11/2022   Essential hypertension 05/11/2021   GERD (gastroesophageal reflux disease) 05/09/2021   HLD (hyperlipidemia) 05/09/2021   Hyperglycemia 05/09/2021   Bilateral lower extremity edema 03/09/2021   Physical deconditioning 02/18/2021   COPD (chronic obstructive pulmonary disease) (HCC) 11/11/2020   GAD (generalized anxiety disorder) 10/21/2020   UTI symptoms 10/21/2020   Urinary incontinence in female 09/28/2020   Chronic CHF (congestive heart failure) (HCC) 09/14/2020   Achalasia of esophagus 08/06/2020   Chronic respiratory failure with hypoxia (HCC) 08/02/2020   Hypokalemia 08/02/2020   Chronic anticoagulation 08/02/2020   Acquired thrombophilia (HCC) 08/02/2020   Persistent atrial fibrillation (HCC) 08/02/2020   Age-related osteoporosis without current pathological fracture 01/15/2020   History of compression fracture of spine 12/02/2019   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   Morbid obesity with body mass index (BMI) of 40.0 or higher (HCC) 12/02/2019   Oxygen dependent 12/02/2019   Recurrent major depressive disorder, in partial remission (HCC) 12/02/2019   Vitamin D deficiency 12/02/2019   Second degree atrioventricular block 03/17/2014   Dyspnea on exertion 08/31/2005   Mitral valve stenosis 06/13/2004    ONSET DATE: May 2024  REFERRING DIAG: L98.9 (ICD-10-CM) - Sore on leg  THERAPY DIAG:  Other abnormalities of gait and mobility  Sore on leg  Rationale for Evaluation and Treatment: Rehabilitation   Wound Therapy - 07/21/22 0001     Subjective Pt arrived with dressings intact, no reports  of pain.    Patient and Family Stated Goals wound to heal    Date of Onset 05/29/22    Prior Treatments has tried betadine, neosporin    Pain Scale 0-10    Pain Score 0-No pain    Evaluation and Treatment Procedures Explained to Patient/Family Yes    Evaluation and Treatment Procedures agreed to    Wound  Properties Date First Assessed: 06/26/22 Time First Assessed: 1040 Wound Type: Skin tear Location: Pretibial Location Orientation: Left;Anterior Present on Admission: Yes   Wound Image Images linked: 1    Dressing Type Impregnated gauze (bismuth);Gauze (Comment);Compression wrap    Dressing Changed Changed    Dressing Status Old drainage    Dressing Change Frequency PRN    Site / Wound Assessment Granulation tissue    % Wound base Red or Granulating 100%    Peri-wound Assessment Edema    Wound Length (cm) 2 cm    Wound Width (cm) 1.7 cm    Wound Surface Area (cm^2) 3.4 cm^2    Margins Unattached edges (unapproximated)    Drainage Amount Minimal    Drainage Description Serosanguineous    Treatment Cleansed;Debridement (Selective)    Selective Debridement (non-excisional) - Location Left anterior tibial wound    Selective Debridement (non-excisional) - Tools Used Forceps;Scalpel;Scissors    Selective Debridement (non-excisional) - Tissue Removed slough, peeling skin    Wound Therapy - Clinical Statement see below    Wound Therapy - Functional Problem List bathing, dressing    Factors Delaying/Impairing Wound Healing Multiple medical problems;Immobility    Hydrotherapy Plan Debridement;Dressing change;Electrical stimulation;Patient/family education;Pulsatile lavage with suction;Ultrasonic wound therapy @35  KHz (+/- 3 KHz)    Wound Therapy - Frequency 2X / week    Wound Therapy - Current Recommendations PT    Wound Plan debride and dressing changes PRN    Dressing  vaseline to perimeter, xeroform on 4x4, foam, medipore tape, abd pads and cotton for shape at ankle, kerlix, coban, #5 netting                 PATIENT EDUCATION: Education details: Patient educated on exam findings, POC, dressing changes if soiled, removal of coban layer if painful Person educated: Patient Education method: Explanation Education comprehension: verbalized understanding   HOME EXERCISE  PROGRAM: N/a   GOALS: Goals reviewed with patient? Yes  SHORT TERM GOALS: Target date: 07/17/2022    Wound to be fully granulated to demonstrate wound healing.  Baseline: Goal status: INITIAL   LONG TERM GOALS: Target date: 08/07/2022    Wound to be healed to reduce risk of infection. Baseline:  Goal status: INITIAL    ASSESSMENT:  CLINICAL IMPRESSION: Wound is approximating nicely with reduction in width.  No hypergranulation present this session.  Selective debridement for removal of slough from wound bed and dry skin perimeter of wound.  Continued with xeroform, additional ABD pad with cotton for shape to prevent sliding down, kerlix and coban with netting over.  Reports of comfort at [facility].  OBJECTIVE IMPAIRMENTS: Abnormal gait, decreased activity tolerance, decreased balance, decreased mobility, difficulty walking, increased edema, pain, and skin integrity .   ACTIVITY LIMITATIONS: bathing, dressing, and locomotion level  PARTICIPATION LIMITATIONS: meal prep, cleaning, laundry, and community activity  PERSONAL FACTORS: Age and 3+ comorbidities: CHF, afib, HTN, HLD  are also affecting patient's functional outcome.   REHAB POTENTIAL: Good  CLINICAL DECISION MAKING: Evolving/moderate complexity  EVALUATION COMPLEXITY: Moderate  PLAN: PT FREQUENCY: 2x/week  PT DURATION: 6 weeks  PLANNED INTERVENTIONS:  Therapeutic exercises, Therapeutic activity, Neuromuscular re-education, Balance training, Gait training, Patient/Family education, Joint manipulation, Joint mobilization, Stair training, Orthotic/Fit training, DME instructions, Aquatic Therapy, Dry Needling, Electrical stimulation, Spinal manipulation, Spinal mobilization, Cryotherapy, Moist heat, Compression bandaging, scar mobilization, Splintting, Taping, Traction, Ultrasound, Ionotophoresis 4mg /ml Dexamethasone, and Manual therapy  PLAN FOR NEXT SESSION: debride and dressing changes PRN, possibly transition to home  health for wound care once debridement is no longer needed as transportation is an issue for patient  Becky Sax, LPTA/CLT; CBIS [phone]  Juel Burrow, PTA 07/21/2022, 4:14 PM

## 2022-07-24 ENCOUNTER — Ambulatory Visit (HOSPITAL_COMMUNITY): Payer: Medicare HMO | Admitting: Physical Therapy

## 2022-07-24 NOTE — Patient Instructions (Signed)
Visit Information  Thank you for taking time to visit with me today. Please don't hesitate to contact me if I can be of assistance to you.   Following are the goals we discussed today:   Goals Addressed             This Visit's Progress    To request home health for wound care       Care Coordination Interventions: Evaluation of current treatment plan related to right lower leg skin tear and patient's adherence to plan as established by provider Reviewed and discussed with patient she is currently receiving outpatient PT for wound care to her right lower leg Discussed with patient she initially was referred to home health, unfortunately the home health nurse was not trained to perform wound care, she referred the patient to the wound care clinic in [place] Discussed with patient that she no longer has reliable transportation, her wound is getting smaller and healing, therefore patient is requesting assistance with coordinating Home Health to resume her wound care Discussed with patient this RN will contact her PCP provider to request orders for [facility] for wound care, patient was encouraged to keep all scheduled wound care visits until [facility] has been approved/arranged and patient verbalizes understanding Placed outbound call to PCP office, spoke with Morrie Sheldon regarding patient's concern and request for home health to resume wound care, discussed with Morrie Sheldon, she will relay this information to the PCP provider Enid Skeens NP Scheduled RN CC follow up with patient on 08/01/22 @11 :30 AM        Our next appointment is by telephone on 08/01/22 at 11:30 AM  Please call the care guide team at [phone] if you need to cancel or reschedule your appointment.   If you are experiencing a Mental Health or Behavioral Health Crisis or need someone to talk to, please call 1-800-273-TALK (toll free, 24 hour hotline)  Patient verbalizes understanding of instructions and care plan provided today and agrees to  view in MyChart. Active MyChart status and patient understanding of how to access instructions and care plan via MyChart confirmed with patient.     [name], RN, BSN, CCM Care Management Coordinator [facility] Care Management Direct Phone: [phone]

## 2022-07-24 NOTE — Patient Outreach (Signed)
  Care Coordination   Follow Up Visit Note   07/21/2022 Name: Lindsay [name] MRN: [id number] DOB: 04/28/1940  Lindsay [name] is a 82 y.o. year old female who sees Early, Sung Amabile, NP for primary care. I spoke with  [name] by phone today.  What matters to the patients health and wellness today?  Patient would like to request home health to resume her wound care.     Goals Addressed             This Visit's Progress    To request home health for wound care       Care Coordination Interventions: Evaluation of current treatment plan related to right lower leg skin tear and patient's adherence to plan as established by provider Reviewed and discussed with patient she is currently receiving outpatient PT for wound care to her right lower leg Discussed with patient she initially was referred to home health, unfortunately the home health nurse was not trained to perform wound care, she referred the patient to the wound care clinic in [place] Discussed with patient that she no longer has reliable transportation, her wound is getting smaller and healing, therefore patient is requesting assistance with coordinating Home Health to resume her wound care Discussed with patient this RN will contact her PCP provider to request orders for [facility] for wound care, patient was encouraged to keep all scheduled wound care visits until [facility] has been approved/arranged and patient verbalizes understanding Placed outbound call to PCP office, spoke with Morrie Sheldon regarding patient's concern and request for home health to resume wound care, discussed with Morrie Sheldon, she will relay this information to the PCP provider Enid Skeens NP Scheduled RN CC follow up with patient on 08/01/22 @11 :30 AM    Interventions Today    Flowsheet Row Most Recent Value  Chronic Disease   Chronic disease during today's visit Other  [skin tear wound to left lower leg]  General Interventions   General Interventions Discussed/Reviewed General  Interventions Discussed, General Interventions Reviewed, Doctor Visits, Communication with  Doctor Visits Discussed/Reviewed Doctor Visits Discussed, Doctor Visits Reviewed, Specialist  Communication with PCP/Specialists  [spoke to [facility] for Enid Skeens NP]  Education Interventions   Education Provided Provided Education  Provided Verbal Education On Other  [wound care]          SDOH assessments and interventions completed:  No     Care Coordination Interventions:  Yes, provided   Follow up plan: Follow up call scheduled for 08/01/22 @11 :30 AM    Encounter Outcome:  Pt. Visit Completed

## 2022-07-28 ENCOUNTER — Encounter (HOSPITAL_COMMUNITY): Payer: Self-pay

## 2022-07-28 ENCOUNTER — Ambulatory Visit (HOSPITAL_COMMUNITY): Payer: Medicare HMO | Attending: Nurse Practitioner

## 2022-07-28 DIAGNOSIS — L989 Disorder of the skin and subcutaneous tissue, unspecified: Secondary | ICD-10-CM | POA: Diagnosis present

## 2022-07-28 DIAGNOSIS — R2689 Other abnormalities of gait and mobility: Secondary | ICD-10-CM | POA: Diagnosis present

## 2022-07-28 NOTE — Telephone Encounter (Signed)
Pt is at wound care right now and they will take measurements and pt will call back after appt to schedule a televisit

## 2022-07-28 NOTE — Telephone Encounter (Signed)
Per home health, she will need to have a visit with Huntley Dec to document the reason for needing home health before insurance will cover visits. This can be either video or in person. [facility] can't take the patient but [facility] is able to once visit with Huntley Dec is complete.

## 2022-07-28 NOTE — Telephone Encounter (Signed)
Referral placed.

## 2022-07-28 NOTE — Therapy (Signed)
OUTPATIENT PHYSICAL THERAPY WOUND TREATMENT   [name] MRN: [id number] DOB:[birth year], 82 y.o., female Today's Date: 07/28/2022   PCP: Tollie Eth, NP REFERRING PROVIDER: Tollie Eth, NP  END OF SESSION:  PT End of Session - 07/28/22 1222     Visit Number 6    Number of Visits 12    Date for PT Re-Evaluation 08/07/22    Authorization Type [employer]    Authorization Time Period 12 visits and 1 reeval approved 6/3 -7/15    Progress Note Due on Visit 10    PT Start Time 1118    PT Stop Time 1208    PT Time Calculation (min) 50 min    Activity Tolerance Patient tolerated treatment well    Behavior During Therapy [facility] for tasks assessed/performed              Past Medical History:  Diagnosis Date   Acute diastolic CHF (congestive heart failure) (HCC) 08/03/2020   Acute non-recurrent pansinusitis 10/21/2020   Acute on chronic respiratory failure with hypoxia (HCC) 08/02/2020   Asthma    Asthma exacerbation 08/02/2020   Atrial fibrillation (HCC)    Dysphagia 08/02/2020   Dyspnea 05/09/2021   Elevated troponin 09/14/2020   High cholesterol    HTN (hypertension)    Hyperkalemia 09/14/2020   Hyponatremia 08/02/2020   Low grade squamous intraepithelial lesion on cytologic smear of cervix (LGSIL) 12/02/2019   Moderate persistent asthma without complication 12/02/2019   Respiratory failure with hypoxia (HCC)    Sore throat 11/16/2020   Past Surgical History:  Procedure Laterality Date   CARDIOVERSION N/A 09/13/2020   Procedure: CARDIOVERSION;  Surgeon: Parke Poisson, MD;  Location: [facility] ENDOSCOPY;  Service: Cardiovascular;  Laterality: N/A;   ESOPHAGOGASTRODUODENOSCOPY N/A 08/05/2020   Procedure: ESOPHAGOGASTRODUODENOSCOPY (EGD);  Surgeon: Kathi Der, MD;  Location: [facility] ENDOSCOPY;  Service: Gastroenterology;  Laterality: N/A;   Patient Active Problem List   Diagnosis Date Noted   Sebaceous cyst 05/26/2022   Subacute cough 05/26/2022    Bilateral hearing loss 05/11/2022   Essential hypertension 05/11/2021   GERD (gastroesophageal reflux disease) 05/09/2021   HLD (hyperlipidemia) 05/09/2021   Hyperglycemia 05/09/2021   Bilateral lower extremity edema 03/09/2021   Physical deconditioning 02/18/2021   COPD (chronic obstructive pulmonary disease) (HCC) 11/11/2020   GAD (generalized anxiety disorder) 10/21/2020   UTI symptoms 10/21/2020   Urinary incontinence in female 09/28/2020   Chronic CHF (congestive heart failure) (HCC) 09/14/2020   Achalasia of esophagus 08/06/2020   Chronic respiratory failure with hypoxia (HCC) 08/02/2020   Hypokalemia 08/02/2020   Chronic anticoagulation 08/02/2020   Acquired thrombophilia (HCC) 08/02/2020   Persistent atrial fibrillation (HCC) 08/02/2020   Age-related osteoporosis without current pathological fracture 01/15/2020   History of compression fracture of spine 12/02/2019   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   Morbid obesity with body mass index (BMI) of 40.0 or higher (HCC) 12/02/2019   Oxygen dependent 12/02/2019   Recurrent major depressive disorder, in partial remission (HCC) 12/02/2019   Vitamin D deficiency 12/02/2019   Second degree atrioventricular block 03/17/2014   Dyspnea on exertion 08/31/2005   Mitral valve stenosis 06/13/2004    ONSET DATE: May 2024  REFERRING DIAG: L98.9 (ICD-10-CM) - Sore on leg  THERAPY DIAG:  Other abnormalities of gait and mobility  Sore on leg  Rationale for Evaluation and Treatment: Rehabilitation   Wound Therapy - 07/28/22 0001     Subjective Pt arrived with dressings intact, no  reoprts of pain though dressings have slid down.  Reports she continues to have difficulty with transportation to make it to therapy.    Patient and Family Stated Goals wound to heal    Date of Onset 05/29/22    Prior Treatments has tried betadine, neosporin    Pain Scale 0-10    Pain Score 0-No pain    Evaluation and  Treatment Procedures Explained to Patient/Family Yes    Evaluation and Treatment Procedures agreed to    Wound Properties Date First Assessed: 06/26/22 Time First Assessed: 1040 Wound Type: Skin tear Location: Pretibial Location Orientation: Left;Anterior Present on Admission: Yes   Wound Image Images linked: 1    Dressing Type Impregnated gauze (bismuth);Gauze (Comment);Compression wrap    Dressing Changed Changed    Dressing Status Old drainage    Dressing Change Frequency PRN    Site / Wound Assessment Granulation tissue    % Wound base Red or Granulating 100%    Peri-wound Assessment Edema    Wound Length (cm) 1.3 cm    Wound Width (cm) 1.2 cm    Wound Surface Area (cm^2) 1.56 cm^2    Margins Unattached edges (unapproximated)    Drainage Amount Scant    Drainage Description Serosanguineous    Treatment Cleansed;Debridement (Selective)    Selective Debridement (non-excisional) - Location Left anterior tibial wound    Selective Debridement (non-excisional) - Tools Used Forceps;Scalpel;Scissors    Selective Debridement (non-excisional) - Tissue Removed slough, peeling skin    Wound Therapy - Clinical Statement see below    Wound Therapy - Functional Problem List bathing, dressing    Factors Delaying/Impairing Wound Healing Multiple medical problems;Immobility    Hydrotherapy Plan Debridement;Dressing change;Electrical stimulation;Patient/family education;Pulsatile lavage with suction;Ultrasonic wound therapy @35  KHz (+/- 3 KHz)    Wound Therapy - Frequency 2X / week    Wound Therapy - Current Recommendations PT    Wound Plan debride and dressing changes PRN    Dressing  vaseline to perimeter, xeroform on 4x4, cotton with 1/2in foam, kerlix, coban, #5 netting                  PATIENT EDUCATION: Education details: Patient educated on exam findings, POC, dressing changes if soiled, removal of coban layer if painful Person educated: Patient Education method:  Explanation Education comprehension: verbalized understanding   HOME EXERCISE PROGRAM: N/a   GOALS: Goals reviewed with patient? Yes  SHORT TERM GOALS: Target date: 07/17/2022    Wound to be fully granulated to demonstrate wound healing.  Baseline: Goal status: INITIAL   LONG TERM GOALS: Target date: 08/07/2022    Wound to be healed to reduce risk of infection. Baseline:  Goal status: INITIAL    ASSESSMENT:  CLINICAL IMPRESSION: Dressings continue to slide down, added 1/2 foam for shape this treatment.  Wound continues to approximate nicely with reduction in both width and length (picture in media).  Minimal selective debridement necessary this session for removal of slough from wound bed and dry skin perimeter to promote healing.  Continued with xeroform, foam, kerlix and coban with netting.  Discussed transitioning to home health this session.  MD called during session and discussed that is appropriate to transition to home health at this point.  Office stated she needs to make virtual apt with doctor before able to make referral.  Pt also educated this session with benefits of compression garments for edema control.  Measurements taken and given handout for ETI to place order.  Shown butler  to assist with donning.   OBJECTIVE IMPAIRMENTS: Abnormal gait, decreased activity tolerance, decreased balance, decreased mobility, difficulty walking, increased edema, pain, and skin integrity .   ACTIVITY LIMITATIONS: bathing, dressing, and locomotion level  PARTICIPATION LIMITATIONS: meal prep, cleaning, laundry, and community activity  PERSONAL FACTORS: Age and 3+ comorbidities: CHF, afib, HTN, HLD  are also affecting patient's functional outcome.   REHAB POTENTIAL: Good  CLINICAL DECISION MAKING: Evolving/moderate complexity  EVALUATION COMPLEXITY: Moderate  PLAN: PT FREQUENCY: 2x/week  PT DURATION: 6 weeks  PLANNED INTERVENTIONS: Therapeutic exercises, Therapeutic  activity, Neuromuscular re-education, Balance training, Gait training, Patient/Family education, Joint manipulation, Joint mobilization, Stair training, Orthotic/Fit training, DME instructions, Aquatic Therapy, Dry Needling, Electrical stimulation, Spinal manipulation, Spinal mobilization, Cryotherapy, Moist heat, Compression bandaging, scar mobilization, Splintting, Taping, Traction, Ultrasound, Ionotophoresis 4mg /ml Dexamethasone, and Manual therapy  PLAN FOR NEXT SESSION: debride and dressing changes PRN, possibly transition to home health for wound care once debridement is no longer needed as transportation is an issue for patient  Becky Sax, LPTA/CLT; CBIS [phone]  Juel Burrow, PTA 07/28/2022, 12:22 PM

## 2022-07-31 ENCOUNTER — Ambulatory Visit (HOSPITAL_COMMUNITY): Payer: Medicare HMO

## 2022-08-01 ENCOUNTER — Ambulatory Visit: Payer: Self-pay

## 2022-08-01 ENCOUNTER — Encounter: Payer: Self-pay | Admitting: Nurse Practitioner

## 2022-08-01 ENCOUNTER — Telehealth (INDEPENDENT_AMBULATORY_CARE_PROVIDER_SITE_OTHER): Payer: Medicare HMO | Admitting: Nurse Practitioner

## 2022-08-01 VITALS — Wt 230.0 lb

## 2022-08-01 DIAGNOSIS — S81801A Unspecified open wound, right lower leg, initial encounter: Secondary | ICD-10-CM | POA: Diagnosis not present

## 2022-08-01 DIAGNOSIS — J0140 Acute pansinusitis, unspecified: Secondary | ICD-10-CM

## 2022-08-01 HISTORY — DX: Unspecified open wound, right lower leg, initial encounter: S81.801A

## 2022-08-01 MED ORDER — AZITHROMYCIN 250 MG PO TABS
ORAL_TABLET | ORAL | 0 refills | Status: AC
Start: 2022-08-01 — End: 2022-08-06

## 2022-08-01 NOTE — Assessment & Plan Note (Signed)
New onset of sinusitis in the setting of multiple co-morbid conditions. Due to the increased risks of respiratory compromise, I recommend starting antibiotic therapy today.  Plan: - Azithromycin 500mg  today with 250mg  a day thereafter.  - Contact the office if symptoms continue.

## 2022-08-01 NOTE — Patient Outreach (Signed)
  Care Coordination   Follow Up Visit Note   08/01/2022 Name: Raevin Wierenga MRN: [id number] DOB: January 15, 1941  Adaira Centola is a 82 y.o. year old female who sees Early, Sung Amabile, NP for primary care. I spoke with  [name] by phone today.  What matters to the patients health and wellness today?  Patient would like to receive wound care in her home to avoid having to travel outpatient.     Goals Addressed             This Visit's Progress    To request home health for wound care       Care Coordination Interventions: Evaluation of current treatment plan related to right lower leg skin tear and patient's adherence to plan as established by provider Dicussed with patient a message from PCP office was received advising patient is scheduled for a tele-visit with Georgeanne Nim NP today, 08/01/22 at 1:30 PM in order to provide updated clinical documentation for her wound care Determined [facility] will provide this care and has outreached to patient to schedule an initial visit for tomorrow, 08/02/22 Discussed with patient she will contact the outpatient wound clinic to cancel her upcoming appointment after meeting with the in home wound care nurse scheduled for tomorrow     Interventions Today    Flowsheet Row Most Recent Value  Chronic Disease   Chronic disease during today's visit Other  [wound]  General Interventions   General Interventions Discussed/Reviewed General Interventions Discussed, General Interventions Reviewed, Doctor Visits  Doctor Visits Discussed/Reviewed Doctor Visits Discussed, Doctor Visits Reviewed, PCP  Education Interventions   Education Provided Provided Education  Provided Verbal Education On When to see the doctor, Other  [facility] referral]          SDOH assessments and interventions completed:  No     Care Coordination Interventions:  Yes, provided   Follow up plan: Follow up call scheduled for 08/15/22 @2 :00 PM     Encounter Outcome:  Pt.  Visit Completed

## 2022-08-01 NOTE — Patient Instructions (Signed)
Visit Information  Thank you for taking time to visit with me today. Please don't hesitate to contact me if I can be of assistance to you.   Following are the goals we discussed today:   Goals Addressed             This Visit's Progress    To request home health for wound care       Care Coordination Interventions: Evaluation of current treatment plan related to right lower leg skin tear and patient's adherence to plan as established by provider Dicussed with patient a message from PCP office was received advising patient is scheduled for a tele-visit with Georgeanne Nim NP today, 08/01/22 at 1:30 PM in order to provide updated clinical documentation for her wound care Determined [facility] will provide this care and has outreached to patient to schedule an initial visit for tomorrow, 08/02/22 Discussed with patient she will contact the outpatient wound clinic to cancel her upcoming appointment after meeting with the in home wound care nurse scheduled for tomorrow         Our next appointment is by telephone on 08/15/22 at 2:00 PM  Please call the care guide team at [phone] if you need to cancel or reschedule your appointment.   If you are experiencing a Mental Health or Behavioral Health Crisis or need someone to talk to, please call 1-800-273-TALK (toll free, 24 hour hotline)  Patient verbalizes understanding of instructions and care plan provided today and agrees to view in MyChart. Active MyChart status and patient understanding of how to access instructions and care plan via MyChart confirmed with patient.     [name], RN, BSN, CCM Care Management Coordinator [facility] Care Management  Direct Phone: [phone]

## 2022-08-01 NOTE — Assessment & Plan Note (Signed)
Ongoing treatment required for right lower leg wound. Unfortunately, the travel to and from the wound center has become a hardship on her and her family. I do feel that in order to continue to maintain appropriate care home health is the best option at this time.  Plan: - Home health services ordered for wound care - Will continue to follow closely.

## 2022-08-01 NOTE — Progress Notes (Signed)
Virtual Visit Encounter mychart visit.   I connected with  Lindsay [name] on 08/01/22 at  1:30 PM EDT by secure video and audio telemedicine application. I verified that I am speaking with the correct person using two identifiers.   I introduced myself as a Publishing rights manager with the practice. The limitations of evaluation and management by telemedicine discussed with the patient and the availability of in person appointments. The patient expressed verbal understanding and consent to proceed.  Participating parties in this visit include: Myself and patient  The patient is: Patient Location: Home I am: Provider Location: Office/Clinic Subjective:    CC and HPI: Lindsay [name] is a 82 y.o. year old female presenting for follow up of right lower extremity wound.  Patient reports the following:  [name] has been going to the wound center 2 days a week for wound care on a regular basis for her right lower leg for several months. She has been seeing success with these treatments, but the travel has become quite bothersome and difficult. [name] tells me that she does not drive any more and must rely on family or friends to take her to her appointments. Her daughter has missed a significant amount of work in order to help with transportation, which is concerning to her. Amare also suffers from significant co-morbid conditions that limit her ability to get in and out of the house easily including oxygen dependence, gait instability,  congestive heart failure and persistent atrial fibrillation. She is requesting home health services to provide wound care due to these circumstances.   [name] also has concerns with congestion in her sinuses and throat that have been ongoing for about a week. She reports inability to cough up mucous, but she can feel it in her throat. She has been drinking plenty of water and using her mucinex twice a day. She denies fever or chills.   Past medical history, Surgical history, Family history  not pertinant except as noted below, Social history, Allergies, and medications have been entered into the medical record, reviewed, and corrections made.   Review of Systems:  All review of systems negative except what is listed in the HPI  Objective:    Alert and oriented x 4 Speaking in clear sentences with no shortness of breath. No distress.  Impression and Recommendations:    Problem List Items Addressed This Visit     Acute non-recurrent pansinusitis    New onset of sinusitis in the setting of multiple co-morbid conditions. Due to the increased risks of respiratory compromise, I recommend starting antibiotic therapy today.  Plan: - Azithromycin 500mg  today with 250mg  a day thereafter.  - Contact the office if symptoms continue.       Relevant Medications   azithromycin (ZITHROMAX) 250 MG tablet   Non-healing wound of right lower extremity - Primary    Ongoing treatment required for right lower leg wound. Unfortunately, the travel to and from the wound center has become a hardship on her and her family. I do feel that in order to continue to maintain appropriate care home health is the best option at this time.  Plan: - Home health services ordered for wound care - Will continue to follow closely.        orders and follow up as documented in EMR I discussed the assessment and treatment plan with the patient. The patient was provided an opportunity to ask questions and all were answered. The patient agreed with the plan and demonstrated an understanding of  the instructions.   The patient was advised to call back or seek an in-person evaluation if the symptoms worsen or if the condition fails to improve as anticipated.  Follow-Up: in 3 months  I provided 28 minutes of non-face-to-face interaction with this non face-to-face encounter including intake, same-day documentation, and chart review.   Tollie Eth, NP , DNP, AGNP-c [facility]  Medicine

## 2022-08-03 ENCOUNTER — Ambulatory Visit (HOSPITAL_COMMUNITY): Payer: Medicare HMO | Admitting: Physical Therapy

## 2022-08-03 ENCOUNTER — Ambulatory Visit (HOSPITAL_COMMUNITY): Payer: Medicare HMO

## 2022-08-03 ENCOUNTER — Telehealth: Payer: Self-pay | Admitting: Nurse Practitioner

## 2022-08-03 NOTE — Telephone Encounter (Signed)
Lindsay [name] from [facility] called and is requesting verbal orders 1 times a for 4 weeks, for wound care She can be reached at [phone]

## 2022-08-14 ENCOUNTER — Telehealth: Payer: Self-pay | Admitting: Pulmonary Disease

## 2022-08-14 DIAGNOSIS — J0100 Acute maxillary sinusitis, unspecified: Secondary | ICD-10-CM

## 2022-08-14 MED ORDER — PREDNISONE 10 MG PO TABS
ORAL_TABLET | ORAL | 0 refills | Status: AC
Start: 2022-08-14 — End: 2022-08-25

## 2022-08-14 MED ORDER — AZITHROMYCIN 250 MG PO TABS
ORAL_TABLET | ORAL | 0 refills | Status: DC
Start: 2022-08-14 — End: 2022-09-22

## 2022-08-14 NOTE — Telephone Encounter (Signed)
Spoke with the pt  She is c/o increased SOB, wheezing, prod cough with green sputum, HA/sinus pressure x 4 days  She is using trelegy daily  She has not been using her neb- "forgets about it"- I encouraged her to try using every 6 hours as needed for her SOB/wheezing  She has not had any fevers, aches  She is still taking her eliquis bid  Has not been using zyrtec but takes mucinex   Dr [name], please advise, thanks!  Allergies  Allergen Reactions   Acetaminophen-Codeine Other (See Comments)    Agitation   Naproxen-Esomeprazole Mg Diarrhea   Codeine Other (See Comments)    hallucinations

## 2022-08-14 NOTE — Telephone Encounter (Signed)
Called patient. She did not answer. Left message for her to call back.

## 2022-08-14 NOTE — Telephone Encounter (Signed)
Zpak and Steroid taper sent in for possible sinus infection. Agree with using nebulizer treatments every 6 hours as needed for her shortness of breath and wheezing.  Hope she feels better soon.  Dr. [name]

## 2022-08-15 ENCOUNTER — Ambulatory Visit: Payer: Self-pay

## 2022-08-15 NOTE — Patient Outreach (Signed)
  Care Coordination   08/15/2022 Name: Anesha Hackert MRN: [id number] DOB: [birth year]   Care Coordination Outreach Attempts:  An unsuccessful telephone outreach was attempted for a scheduled appointment today.  Follow Up Plan:  Additional outreach attempts will be made to offer the patient care coordination information and services.   Encounter Outcome:  No Answer   Care Coordination Interventions:  No, not indicated    [name], RN, BSN, CCM Care Management Coordinator [facility] Care Management Direct Phone: [phone]

## 2022-08-21 ENCOUNTER — Telehealth (HOSPITAL_BASED_OUTPATIENT_CLINIC_OR_DEPARTMENT_OTHER): Payer: Self-pay | Admitting: *Deleted

## 2022-08-21 ENCOUNTER — Other Ambulatory Visit: Payer: Self-pay

## 2022-08-21 ENCOUNTER — Telehealth: Payer: Self-pay | Admitting: Nurse Practitioner

## 2022-08-21 ENCOUNTER — Other Ambulatory Visit (HOSPITAL_BASED_OUTPATIENT_CLINIC_OR_DEPARTMENT_OTHER): Payer: Self-pay | Admitting: *Deleted

## 2022-08-21 DIAGNOSIS — I5033 Acute on chronic diastolic (congestive) heart failure: Secondary | ICD-10-CM

## 2022-08-21 DIAGNOSIS — M810 Age-related osteoporosis without current pathological fracture: Secondary | ICD-10-CM

## 2022-08-21 DIAGNOSIS — F411 Generalized anxiety disorder: Secondary | ICD-10-CM

## 2022-08-21 DIAGNOSIS — F3341 Major depressive disorder, recurrent, in partial remission: Secondary | ICD-10-CM

## 2022-08-21 MED ORDER — ALENDRONATE SODIUM 70 MG PO TABS
70.0000 mg | ORAL_TABLET | ORAL | 1 refills | Status: DC
Start: 2022-08-21 — End: 2023-03-26

## 2022-08-21 MED ORDER — ATORVASTATIN CALCIUM 40 MG PO TABS: 40.0000 mg | ORAL_TABLET | Freq: Every day | ORAL | 3 refills | Status: DC

## 2022-08-21 MED ORDER — TORSEMIDE 20 MG PO TABS
ORAL_TABLET | ORAL | 5 refills | Status: DC
Start: 2022-08-21 — End: 2022-11-16

## 2022-08-21 NOTE — Telephone Encounter (Signed)
Rx(s) sent to pharmacy electronically.  

## 2022-08-21 NOTE — Telephone Encounter (Signed)
Fax refill request   [facility]  Lorazepam Torsemide  Alendronate Sertralline

## 2022-08-22 ENCOUNTER — Telehealth: Payer: Self-pay | Admitting: Nurse Practitioner

## 2022-08-22 ENCOUNTER — Ambulatory Visit: Payer: Self-pay

## 2022-08-22 ENCOUNTER — Other Ambulatory Visit: Payer: Self-pay

## 2022-08-22 DIAGNOSIS — F3341 Major depressive disorder, recurrent, in partial remission: Secondary | ICD-10-CM

## 2022-08-22 DIAGNOSIS — F411 Generalized anxiety disorder: Secondary | ICD-10-CM

## 2022-08-22 MED ORDER — PANTOPRAZOLE SODIUM 20 MG PO TBEC: 20.0000 mg | DELAYED_RELEASE_TABLET | Freq: Every day | ORAL | 1 refills | Status: DC

## 2022-08-22 NOTE — Patient Outreach (Signed)
  Care Coordination   Follow Up Visit Note   08/22/2022 Name: Lindsay [name] MRN: [id number] DOB: March 07, 1940  Lindsay [name] is a 82 y.o. year old female who sees Early, Sung Amabile, NP for primary care. I spoke with  Lindsay [name] by phone today.  What matters to the patients health and wellness today?  Patient would like to stay healthy.     Goals Addressed             This Visit's Progress    COMPLETED: Continue Eliquis to help prevent stroke       Care Coordination Interventions: Counseled on increased risk of stroke due to Afib and benefits of anticoagulation for stroke prevention Reviewed importance of adherence to anticoagulant exactly as prescribed Assessed social determinant of health barriers     COMPLETED: To get shortness of breath under control       Care Coordination Interventions: Provided patient with basic written and verbal COPD education on self care/management/and exacerbation prevention Advised patient to track and manage COPD triggers Provided written and verbal instructions on pursed lip breathing and utilized returned demonstration as teach back     COMPLETED: To lower my LDL       Care Coordination Interventions: Provider established cholesterol goals reviewed Reviewed role and benefits of statin for ASCVD risk reduction Reviewed importance of limiting foods high in cholesterol Mailed printed educational materials related to Cholesterol management      COMPLETED: To request home health for wound care       Care Coordination Interventions: Evaluation of current treatment plan related to right lower leg skin tear and patient's adherence to plan as established by provider Determined patient's wound has healed without complications Educated patient on skin precautions and provided tips on how to prevent further breakdown  Educated on importance of adhering to a low sodium diet to help prevent edema which may increase risk for skin breakdown     Interventions Today     Flowsheet Row Most Recent Value  Chronic Disease   Chronic disease during today's visit Other  [open wound]  General Interventions   General Interventions Discussed/Reviewed General Interventions Discussed, General Interventions Reviewed  Doctor Visits Discussed/Reviewed Doctor Visits Reviewed, Doctor Visits Discussed, PCP  Education Interventions   Education Provided Provided Education  Provided Verbal Education On Other  [skin precautions]  Nutrition Interventions   Nutrition Discussed/Reviewed Nutrition Discussed, Nutrition Reviewed, Decreasing salt          SDOH assessments and interventions completed:  No     Care Coordination Interventions:  Yes, provided   Follow up plan: No further intervention required.   Encounter Outcome:  Pt. Visit Completed

## 2022-08-22 NOTE — Patient Instructions (Signed)
Visit Information  Thank you for taking time to visit with me today. Please don't hesitate to contact me if I can be of assistance to you.   Following are the goals we discussed today:   Goals Addressed             This Visit's Progress    COMPLETED: Continue Eliquis to help prevent stroke       Care Coordination Interventions: Counseled on increased risk of stroke due to Afib and benefits of anticoagulation for stroke prevention Reviewed importance of adherence to anticoagulant exactly as prescribed Assessed social determinant of health barriers     COMPLETED: To get shortness of breath under control       Care Coordination Interventions: Provided patient with basic written and verbal COPD education on self care/management/and exacerbation prevention Advised patient to track and manage COPD triggers Provided written and verbal instructions on pursed lip breathing and utilized returned demonstration as teach back     COMPLETED: To lower my LDL       Care Coordination Interventions: Provider established cholesterol goals reviewed Reviewed role and benefits of statin for ASCVD risk reduction Reviewed importance of limiting foods high in cholesterol Mailed printed educational materials related to Cholesterol management      COMPLETED: To request home health for wound care       Care Coordination Interventions: Evaluation of current treatment plan related to right lower leg skin tear and patient's adherence to plan as established by provider Determined patient's wound has healed without complications Educated patient on skin precautions and provided tips on how to prevent further breakdown  Educated on importance of adhering to a low sodium diet to help prevent edema which may increase risk for skin breakdown         If you are experiencing a Mental Health or Behavioral Health Crisis or need someone to talk to, please call 1-800-273-TALK (toll free, 24 hour hotline)  Patient  verbalizes understanding of instructions and care plan provided today and agrees to view in MyChart. Active MyChart status and patient understanding of how to access instructions and care plan via MyChart confirmed with patient.     [name], RN, BSN, CCM Care Management Coordinator [facility] Care Management  Direct Phone: [phone]

## 2022-08-22 NOTE — Telephone Encounter (Signed)
Fax refill request from [facility]  Mirtazapine  Pantoprazole  Lorazepam

## 2022-08-22 NOTE — Telephone Encounter (Signed)
Lindsay [name] with [facility] called  they discharged her yesterday wound looking great

## 2022-08-23 MED ORDER — SERTRALINE HCL 100 MG PO TABS: 100.0000 mg | ORAL_TABLET | Freq: Every day | ORAL | 3 refills | Status: DC

## 2022-08-23 MED ORDER — LORAZEPAM 1 MG PO TABS
1.0000 mg | ORAL_TABLET | Freq: Two times a day (BID) | ORAL | 2 refills | Status: DC | PRN
Start: 2022-08-23 — End: 2022-11-17

## 2022-08-23 MED ORDER — MIRTAZAPINE 45 MG PO TABS
45.0000 mg | ORAL_TABLET | Freq: Every day | ORAL | 3 refills | Status: DC
Start: 2022-08-23 — End: 2022-11-17

## 2022-09-01 ENCOUNTER — Encounter: Payer: Self-pay | Admitting: Pulmonary Disease

## 2022-09-01 DIAGNOSIS — J4540 Moderate persistent asthma, uncomplicated: Secondary | ICD-10-CM

## 2022-09-04 ENCOUNTER — Other Ambulatory Visit: Payer: Self-pay

## 2022-09-04 DIAGNOSIS — J4540 Moderate persistent asthma, uncomplicated: Secondary | ICD-10-CM

## 2022-09-04 MED ORDER — TRELEGY ELLIPTA 100-62.5-25 MCG/ACT IN AEPB
1.0000 | INHALATION_SPRAY | Freq: Every day | RESPIRATORY_TRACT | 3 refills | Status: DC
Start: 2022-09-04 — End: 2022-10-18

## 2022-09-04 MED ORDER — TRELEGY ELLIPTA 100-62.5-25 MCG/ACT IN AEPB
1.0000 | INHALATION_SPRAY | Freq: Every day | RESPIRATORY_TRACT | 3 refills | Status: DC
Start: 2022-09-04 — End: 2022-09-04

## 2022-09-04 NOTE — Telephone Encounter (Signed)
PT would like a more affordable substitute for Trelegy if possible.

## 2022-09-05 NOTE — Telephone Encounter (Signed)
Please check with her insurance company and see what a cheaper alternative to the Trelegy would be. Thank you!

## 2022-09-08 ENCOUNTER — Other Ambulatory Visit (HOSPITAL_COMMUNITY): Payer: Self-pay

## 2022-09-08 NOTE — Telephone Encounter (Signed)
Alternative triple therapy of Lindsay [name] is covered $151.69 due to patient being in Coverage Gap, also showing Trelegy is still filled so this price may go up due to the price of Trelegy going back to the insurance.

## 2022-09-13 ENCOUNTER — Telehealth: Payer: Self-pay | Admitting: Pulmonary Disease

## 2022-09-13 NOTE — Telephone Encounter (Signed)
Pt needs something other than Trelegy

## 2022-09-14 ENCOUNTER — Encounter: Payer: Self-pay | Admitting: Nurse Practitioner

## 2022-09-14 DIAGNOSIS — Z1231 Encounter for screening mammogram for malignant neoplasm of breast: Secondary | ICD-10-CM

## 2022-09-18 ENCOUNTER — Encounter: Payer: Self-pay | Admitting: Nurse Practitioner

## 2022-09-18 DIAGNOSIS — E042 Nontoxic multinodular goiter: Secondary | ICD-10-CM | POA: Insufficient documentation

## 2022-09-18 DIAGNOSIS — I77811 Abdominal aortic ectasia: Secondary | ICD-10-CM | POA: Insufficient documentation

## 2022-09-18 DIAGNOSIS — G5791 Unspecified mononeuropathy of right lower limb: Secondary | ICD-10-CM | POA: Insufficient documentation

## 2022-09-18 DIAGNOSIS — I6523 Occlusion and stenosis of bilateral carotid arteries: Secondary | ICD-10-CM | POA: Insufficient documentation

## 2022-09-18 DIAGNOSIS — I70223 Atherosclerosis of native arteries of extremities with rest pain, bilateral legs: Secondary | ICD-10-CM | POA: Insufficient documentation

## 2022-09-18 DIAGNOSIS — E039 Hypothyroidism, unspecified: Secondary | ICD-10-CM | POA: Insufficient documentation

## 2022-09-18 DIAGNOSIS — G4700 Insomnia, unspecified: Secondary | ICD-10-CM | POA: Insufficient documentation

## 2022-09-18 DIAGNOSIS — D518 Other vitamin B12 deficiency anemias: Secondary | ICD-10-CM | POA: Insufficient documentation

## 2022-09-21 NOTE — Telephone Encounter (Signed)
Can discuss at next visit

## 2022-09-22 ENCOUNTER — Telehealth: Payer: Self-pay | Admitting: Pulmonary Disease

## 2022-09-22 DIAGNOSIS — J441 Chronic obstructive pulmonary disease with (acute) exacerbation: Secondary | ICD-10-CM

## 2022-09-22 MED ORDER — AZITHROMYCIN 250 MG PO TABS
ORAL_TABLET | ORAL | 0 refills | Status: DC
Start: 2022-09-22 — End: 2022-11-16

## 2022-09-22 MED ORDER — PREDNISONE 10 MG PO TABS
40.0000 mg | ORAL_TABLET | Freq: Every day | ORAL | 0 refills | Status: DC
Start: 2022-09-22 — End: 2022-11-16

## 2022-09-22 NOTE — Telephone Encounter (Signed)
Pt. Is sick and needs med advise and wanting to see if Dr. Could call something in for her

## 2022-09-22 NOTE — Telephone Encounter (Signed)
Prednisone 40mg  daily x 5 days and Z[facility] sent [facility].  Also recommend she increase her torsemide as directed if she is gaining weight given the report of leg swelling.   Thanks, Dr. [name]

## 2022-09-22 NOTE — Telephone Encounter (Signed)
Called and spoke with the pt  She is c/o increased SOB, wheezing and chest congestion over the past 4 days  She is coughing some- non prod  Her feet have been swelling  She is taking her albuterol  inhaler, albuterol neb, trelegy and mucinex  She is scheduled for appt with Dr. [name] for f/u on 09/29/22 Please advise, thanks!  Allergies  Allergen Reactions   Acetaminophen-Codeine Other (See Comments)    Agitation   Naproxen-Esomeprazole Mg Diarrhea   Codeine Other (See Comments)    hallucinations

## 2022-09-22 NOTE — Telephone Encounter (Signed)
Spoke with patient regarding prior message . Per Dr.Dewald sent in Prednisone 40mg  daily x 5 days and [facility]pak [facility].   Also recommend she increase her torsemide as directed if she is gaining weight given the report of leg swelling.    Thanks, Dr. [name]  Patient's voice was understanding. Nothing else further needed.

## 2022-09-27 ENCOUNTER — Other Ambulatory Visit (HOSPITAL_BASED_OUTPATIENT_CLINIC_OR_DEPARTMENT_OTHER): Payer: Self-pay | Admitting: Nurse Practitioner

## 2022-09-27 DIAGNOSIS — F3341 Major depressive disorder, recurrent, in partial remission: Secondary | ICD-10-CM

## 2022-09-29 ENCOUNTER — Encounter: Payer: Self-pay | Admitting: Pulmonary Disease

## 2022-09-29 ENCOUNTER — Ambulatory Visit: Payer: Medicare HMO | Admitting: Pulmonary Disease

## 2022-09-29 VITALS — BP 118/74 | HR 79 | Temp 97.5°F | Ht 68.0 in | Wt 243.0 lb

## 2022-09-29 DIAGNOSIS — J441 Chronic obstructive pulmonary disease with (acute) exacerbation: Secondary | ICD-10-CM

## 2022-09-29 DIAGNOSIS — R6889 Other general symptoms and signs: Secondary | ICD-10-CM | POA: Diagnosis not present

## 2022-09-29 DIAGNOSIS — J4540 Moderate persistent asthma, uncomplicated: Secondary | ICD-10-CM

## 2022-09-29 DIAGNOSIS — Z23 Encounter for immunization: Secondary | ICD-10-CM | POA: Diagnosis not present

## 2022-09-29 MED ORDER — AEROCHAMBER MV MISC: 0 refills | Status: DC

## 2022-09-29 NOTE — Patient Instructions (Addendum)
We will transition you from trelegy to breztri inhaler and see if that helps with your voice hoarseness  Use breztri with spacer  Rinse mouth out after each use  Stop using trelegy ellipta while on breztri  Use albuterol inhaler or nebulizer as needed  We will give you the flu vaccine today  Please let us know if the breztri is working better for you and we will send in a prescription.   Follow up in 4 months

## 2022-09-29 NOTE — Progress Notes (Signed)
Synopsis: Referred in January 2022 for chronic respiratory failure  Subjective:   PATIENT ID: Lindsay [name] GENDER: female DOB: January 30, 1940, MRN: [id number]  HPI  Chief Complaint  Patient presents with   Follow-up    Can not clear throat  Breathing is good  O2 2l  ACT 19    Lindsay [name] is a 82 year old woman, never smoker with chronic respiratory failure on 2L of supplemental oxygen who returns to pulmonary clinic for follow up.  She was treated with prednisone 40mg  x 5 days and Zpak for recent call with increase of dyspnea, cough - mucous production and lower extremity edema. She was also instructed to increase her torsemide for 3 days due to swelling. She is feeling better. She is complaining of scratchy hoarse voice especially after talking for any period of time. She has not had issues with her voice since being on trelegy.   She is interested in getting her flu vaccine today.  OV 05/30/22 She went to ER 05/21/22 for cough, dysphagia. Referred to GI. Placed on soft diet. She is feeling better with less cough and upper airway congestion.   She has skin tear on left shin from 3 days ago.   OV 12/20/21 She was treated for asthma exacerbation 12/07/21 by Rhunette Croft, NP likely secondary to viral infeciton. She was treated with prednisone taper and azithromycin. She is not feeling any better. She continues to have chest congestion and is having trouble coughing it out. She is using zyrtec and mucinex. She continues on trelegy ellipta 1 puff daily. She also complains of decreased hearing of her right ear and sinus pressure/headaches.   OV 09/22/21 She was admitted to the hospital after last visit, 4/17 to 4/21 heart failure exacerbation. She was metapneumovirus postive on extended respiratory viral panel testing leading to acute on chronic resp failure and acute on chronic heart failure.  She reports feeling bad over the past 4-5 days with chest congestion, cough, fatigue, nausea and lower  than normal blood pressure. She reports 2 covid cases at her [facility] center but denies direct exposure or contact. She denies fevers or chills. She has increased wheezing.   She is requesting a handicap placard.  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 There was also concern for asthma in which she was started on trelegy ellipta.   She reports increasing dyspnea and wheezing over the past 3 days. She has noted O2 saturations in the 70s when walking at home on 2L O2. She has not been able to walk as far recently and required a wheelchair to enter clinic today. Her SpO2 was confirmed in clinic to drop in the 70s with ambulation this morning. She has been using duoneb treatments every 6 hours and taking extra lasix doses without improvement in her breathing. She also complains of painful urination and inreased urinary frequency over the past 2 days concerning for UTI. She reports falling at home and scraping her left elbow. She has been feeling lightheaded.   OV 02/17/2020 She reports she started having issues with her breathing 2 years ago after she suffered compound fractures in her back that led to an increase in the curvature of her thoracic spine. She was started on supplemental oxygen shortly after these events occurred. Per her chart she has exertional oxygen desaturations to 87% from her physical therapy notes. She also reports cough and wheezing intermittently. She denies sputum production. She is a never smoker but lived  with a smoker for 20 years. She denies history of pneumonias.   She has been started on trelegy ellipta and she felt this helped her for a period of time but doesn't notice lasting relief from it anymore.   She has atrial fibrillation. She does have leg swelling and is taking torsemide.   She has received 2 covid vaccines. She received her pneumonia vaccine 2 weeks ago and she had the influenza vaccine this past fall.      She lived in [place] before but moved to [place] as she had frequent falls and lived on her own. Her daughter lives here in town.   Past Medical History:  Diagnosis Date   Acute diastolic CHF (congestive heart failure) (HCC) 08/03/2020   Acute non-recurrent pansinusitis 10/21/2020   Acute on chronic respiratory failure with hypoxia (HCC) 08/02/2020   Asthma    Asthma exacerbation 08/02/2020   Atrial fibrillation (HCC)    Dysphagia 08/02/2020   Dyspnea 05/09/2021   Elevated troponin 09/14/2020   High cholesterol    HTN (hypertension)    Hyperkalemia 09/14/2020   Hyponatremia 08/02/2020   Low grade squamous intraepithelial lesion on cytologic smear of cervix (LGSIL) 12/02/2019   Moderate persistent asthma without complication 12/02/2019   Respiratory failure with hypoxia (HCC)    Sore throat 11/16/2020     Family History  Problem Relation Age of Onset   Breast cancer Sister    Hypertension Other      Social History   Socioeconomic History   Marital status: Divorced    Spouse name: Not on file   Number of children: Not on file   Years of education: Not on file   Highest education level: Not on file  Occupational History   Not on file  Tobacco Use   Smoking status: Former    Types: Cigarettes   Smokeless tobacco: Never   Tobacco comments:    Quit remotely 1970s  Vaping Use   Vaping status: Never Used  Substance and Sexual Activity   Alcohol use: Not Currently   Drug use: Never   Sexual activity: Not Currently  Other Topics Concern   Not on file  Social History Narrative   Not on file   Social Determinants of Health   Financial Resource Strain: Low Risk  (07/10/2022)   Overall Financial Resource Strain (CARDIA)    Difficulty of Paying Living Expenses: Not very hard  Food Insecurity: No Food Insecurity (07/10/2022)   Hunger Vital Sign    Worried About Running Out of Food in the Last Year: Never true    Ran Out of Food in the Last Year: Never true   Transportation Needs: Unmet Transportation Needs (07/10/2022)   PRAPARE - Transportation    Lack of Transportation (Medical): Yes    Lack of Transportation (Non-Medical): Yes  Physical Activity: Inactive (07/10/2022)   Exercise Vital Sign    Days of Exercise per Week: 0 days    Minutes of Exercise per Session: 0 min  Stress: Stress Concern Present (07/10/2022)   [employer] of Occupational Health - Occupational Stress Questionnaire    Feeling of Stress : To some extent  Social Connections: Unknown (07/10/2022)   Social Connection and Isolation Panel [NHANES]    Frequency of Communication with Friends and Family: Once a week    Frequency of Social Gatherings with Friends and Family: Once a week    Attends Religious Services: Not on Insurance claims handler of Clubs or Organizations: No  Attends Banker Meetings: Never    Marital Status: Divorced  Catering manager Violence: Not At Risk (07/11/2022)   Humiliation, Afraid, Rape, and Kick questionnaire    Fear of Current or Ex-Partner: No    Emotionally Abused: No    Physically Abused: No    Sexually Abused: No     Allergies  Allergen Reactions   Acetaminophen-Codeine Other (See Comments)    Agitation   Naproxen-Esomeprazole Mg Diarrhea   Codeine Other (See Comments)    hallucinations     Outpatient Medications Prior to Visit  Medication Sig Dispense Refill   albuterol (PROVENTIL) (5 MG/ML) 0.5% nebulizer solution Take 0.5 mLs by nebulization every 6 (six) hours as needed for wheezing or shortness of breath.     alendronate (FOSAMAX) 70 MG tablet Take 1 tablet (70 mg total) by mouth once a week. Take with a full glass of water on an empty stomach. 12 tablet 1   apixaban (ELIQUIS) 5 MG TABS tablet Take 1 tablet (5 mg total) by mouth 2 (two) times daily. 60 tablet 5   atorvastatin (LIPITOR) 40 MG tablet Take 1 tablet (40 mg total) by mouth daily. 90 tablet 3   Calcium-Magnesium-Vitamin D (CALCIUM 1200+D3 PO) Take 1  tablet by mouth in the morning.     cetirizine (ZYRTEC) 10 MG tablet TAKE 1 TABLET BY MOUTH EVERY DAY 30 tablet 11   Elastic Bandages & Supports (MEDICAL COMPRESSION STOCKINGS) MISC Two pair knee high 30-71mmHg compression stockings. Sized XL. As covered by insurance for ICD-10: I50.33, R60.0. 2 each 1   Fluticasone-Umeclidin-Vilant (TRELEGY ELLIPTA) 100-62.5-25 MCG/ACT AEPB Inhale 1 puff into the lungs daily. 180 each 3   guaiFENesin (MUCINEX) 600 MG 12 hr tablet TAKE 1 TABLET BY MOUTH 2 TIMES DAILY 30 tablet 0   ipratropium-albuterol (DUONEB) 0.5-2.5 (3) MG/3ML SOLN Inhale 3 mLs into the lungs every 6 (six) hours as needed (wheezing). 1080 each 3   LORazepam (ATIVAN) 1 MG tablet Take 1 tablet (1 mg total) by mouth 2 (two) times daily as needed for anxiety. 60 tablet 2   Menthol (COUGH DROPS) 5.8 MG LOZG Use as directed 1 lozenge (5.8 mg total) in the mouth or throat as needed (for dry, sore, or scratchy throat). 36 lozenge 11   metoprolol tartrate (LOPRESSOR) 25 MG tablet Take 25 mg by mouth 2 (two) times daily.     mirtazapine (REMERON) 45 MG tablet Take 1 tablet (45 mg total) by mouth at bedtime. 90 tablet 3   Multiple Vitamin (MULTIVITAMIN WITH MINERALS) TABS tablet Take 1 tablet by mouth in the morning.     OXYGEN Inhale 2 L into the lungs daily as needed (shortness of breath or activity level).     pantoprazole (PROTONIX) 20 MG tablet Take 1 tablet (20 mg total) by mouth daily. 90 tablet 1   sertraline (ZOLOFT) 100 MG tablet Take 1 tablet (100 mg total) by mouth at bedtime. 90 tablet 3   torsemide (DEMADEX) 20 MG tablet TAKE 2 TABLETS DAILY EVERY MORNING. TAKE AN ADDITIONAL 2 TABLETS AS NEEDED FOR WEIGHT GAIN OF 2 POUNDS OVERNIGHT OR 5 POUNDS IN ONE WEEK. 225 tablet 5   azithromycin (ZITHROMAX) 250 MG tablet Take as directed (Patient not taking: Reported on 09/29/2022) 6 tablet 0   potassium chloride 20 MEQ TBCR Take 20 mEq by mouth daily. 90 tablet 3   predniSONE (DELTASONE) 10 MG tablet Take  4 tablets (40 mg total) by mouth daily with breakfast. (Patient not taking: Reported  on 09/29/2022) 20 tablet 0   sucralfate (CARAFATE) 1 g tablet Take 1 tablet (1 g total) by mouth 4 (four) times daily -  with meals and at bedtime for 14 days. 56 tablet 0   No facility-administered medications prior to visit.   Review of Systems  Constitutional:  Negative for chills, fever, malaise/fatigue and weight loss.  HENT:  Negative for congestion, hearing loss, sinus pain and sore throat.   Eyes: Negative.   Respiratory:  Positive for cough, sputum production and shortness of breath. Negative for hemoptysis and wheezing.   Cardiovascular:  Negative for chest pain, palpitations, orthopnea, claudication and leg swelling.  Gastrointestinal:  Negative for abdominal pain, heartburn, nausea and vomiting.  Genitourinary: Negative.   Musculoskeletal:  Negative for back pain, falls, joint pain and myalgias.  Skin:  Negative for rash.  Neurological:  Negative for dizziness and weakness.  Endo/Heme/Allergies: Negative.   Psychiatric/Behavioral: Negative.      Objective:   Vitals:   09/29/22 1536  BP: 118/74  Pulse: 79  Temp: (!) 97.5 F (36.4 C)  TempSrc: Temporal  SpO2: 98%  Weight: 243 lb (110.2 kg)  Height: 5\' 8"  (1.727 m)     Physical Exam Constitutional:      General: She is not in acute distress.    Appearance: She is obese.  HENT:     Head: Normocephalic and atraumatic.  Eyes:     General: No scleral icterus.    Conjunctiva/sclera: Conjunctivae normal.  Cardiovascular:     Rate and Rhythm: Normal rate and regular rhythm.     Pulses: Normal pulses.     Heart sounds: Normal heart sounds. No murmur heard. Pulmonary:     Breath sounds: Decreased air movement present. No wheezing, rhonchi or rales.  Musculoskeletal:     Right lower leg: No edema.     Left lower leg: No edema.     Comments: Scoliosis noted  Skin:    General: Skin is warm and dry.  Neurological:     Mental Status:  She is alert.    CBC    Component Value Date/Time   WBC 8.0 06/14/2022 1859   RBC 4.22 06/14/2022 1859   HGB 12.6 06/14/2022 1859   HGB 12.8 07/11/2021 1409   HGB 13.0 10/21/2020 1104   HCT 38.1 06/14/2022 1859   HCT 39.5 10/21/2020 1104   PLT 313 06/14/2022 1859   PLT 273 07/11/2021 1409   PLT 320 10/21/2020 1104   MCV 90.3 06/14/2022 1859   MCV 94 10/21/2020 1104   MCH 29.9 06/14/2022 1859   MCHC 33.1 06/14/2022 1859   RDW 14.4 06/14/2022 1859   RDW 12.8 10/21/2020 1104   LYMPHSABS 1.1 06/14/2022 1859   LYMPHSABS 1.2 10/21/2020 1104   MONOABS 0.6 06/14/2022 1859   EOSABS 0.2 06/14/2022 1859   EOSABS 0.1 10/21/2020 1104   BASOSABS 0.1 06/14/2022 1859   BASOSABS 0.1 10/21/2020 1104      Latest Ref Rng & Units 06/30/2022    4:42 PM 06/14/2022    6:59 PM 05/21/2022    3:45 PM  BMP  Glucose 70 - 99 mg/dL 90  962  85   BUN 8 - 27 mg/dL 20  18  34   Creatinine 0.57 - 1.00 mg/dL 9.52  8.41  3.24   BUN/Creat Ratio 12 - 28 21     Sodium 134 - 144 mmol/L 142  140  144   Potassium 3.5 - 5.2 mmol/L 4.2  3.4  3.0  Chloride 96 - 106 mmol/L 99  99  97   CO2 20 - 29 mmol/L 27  30  37   Calcium 8.7 - 10.3 mg/dL 9.1  9.3  9.2    Chest imaging: CTA Chest 05/21/22 1. No pulmonary embolus. 2. Cardiomegaly 3. Stable aneurysmal aortic arch (4 cm). Recommend semi-annual imaging followup by CTA or MRA and referral to cardiothoracic surgery if not already obtained. This recommendation follows 2010 ACCF/AHA/AATS/ACR/ASA/SCA/SCAI/SIR/STS/SVM Guidelines for the Diagnosis and Management of Patients With Thoracic Aortic Disease. Circulation. 2010; 121: W295-A21. Aortic aneurysm NOS (ICD10-I71.9). 4. No acute intrapulmonary abnormality. 5. Aortic Atherosclerosis (ICD10-I70.0) with at least 2 vessel coronary artery calcifications.  CXR 12/07/21 Frontal and lateral views of the chest demonstrate an enlarged cardiac silhouette. There is ectasia of the thoracic aorta. There is increased  central vascular congestion without overt edema. No effusion or pneumothorax. The lungs remain hyperinflated. Multiple thoracic compression deformities are again seen, with exaggerated thoracic kyphosis.  CXR 12/16/19 The lungs are under aerated with bronchovascular crowding and interstitial prominence. Increased AP diameter on the lateral view. No focal airspace opacities.  Minimal blunting of both costophrenic angles.  Mild enlargement of the cardiac silhouette.  Prominent central vessels. The thoracic aorta is markedly tortuous. Moderate scattered bony degenerative changes. Increased kyphosis with decreased height of numerous thoracic vertebral bodies. Levoscoliosis of the upper thoracic spine. Diffuse decreased bone density.  PFT:     No data to display          Labs: Reviewed from [facility] at [facility] 1.11 on 12/02/19 Normal CBC  Assessment & Plan:   Need for immunization against influenza - Plan: CANCELED: Flu vaccine HIGH DOSE PF (Fluzone High dose)  Encounter for immunization - Plan: Flu Vaccine Trivalent High Dose (Fluad)  Moderate persistent asthma without complication - Plan: Spacer/Aero-Holding Chambers (AEROCHAMBER MV) inhaler  COPD with acute exacerbation (HCC) - Plan: Spacer/Aero-Holding Chambers (AEROCHAMBER MV) inhaler  Discussion: Lindsay [name] is a 82 year old woman, never smoker with chronic respiratory failure on 2L of supplemental oxygen who returns to pulmonary clinic for asthma.  She has hoarseness of her voice possible due to trelegy ellipta. We will transition her to breztri inhaler with spacer and monitor for improvement in her voice/throat symptoms.   She is to continue supplemental oxygen.   Will give flu vaccine today.  Follow up in 4 months.  Melody Comas, MD [facility] Office: [phone]    Current Outpatient Medications:    albuterol (PROVENTIL) (5 MG/ML) 0.5% nebulizer solution, Take 0.5 mLs by  nebulization every 6 (six) hours as needed for wheezing or shortness of breath., Disp: , Rfl:    alendronate (FOSAMAX) 70 MG tablet, Take 1 tablet (70 mg total) by mouth once a week. Take with a full glass of water on an empty stomach., Disp: 12 tablet, Rfl: 1   apixaban (ELIQUIS) 5 MG TABS tablet, Take 1 tablet (5 mg total) by mouth 2 (two) times daily., Disp: 60 tablet, Rfl: 5   atorvastatin (LIPITOR) 40 MG tablet, Take 1 tablet (40 mg total) by mouth daily., Disp: 90 tablet, Rfl: 3   Calcium-Magnesium-Vitamin D (CALCIUM 1200+D3 PO), Take 1 tablet by mouth in the morning., Disp: , Rfl:    cetirizine (ZYRTEC) 10 MG tablet, TAKE 1 TABLET BY MOUTH EVERY DAY, Disp: 30 tablet, Rfl: 11   Elastic Bandages & Supports (MEDICAL COMPRESSION STOCKINGS) MISC, Two pair knee high 30-7mmHg compression stockings. Sized XL. As covered by insurance for ICD-10:  I50.33, R60.0., Disp: 2 each, Rfl: 1   Fluticasone-Umeclidin-Vilant (TRELEGY ELLIPTA) 100-62.5-25 MCG/ACT AEPB, Inhale 1 puff into the lungs daily., Disp: 180 each, Rfl: 3   guaiFENesin (MUCINEX) 600 MG 12 hr tablet, TAKE 1 TABLET BY MOUTH 2 TIMES DAILY, Disp: 30 tablet, Rfl: 0   ipratropium-albuterol (DUONEB) 0.5-2.5 (3) MG/3ML SOLN, Inhale 3 mLs into the lungs every 6 (six) hours as needed (wheezing)., Disp: 1080 each, Rfl: 3   LORazepam (ATIVAN) 1 MG tablet, Take 1 tablet (1 mg total) by mouth 2 (two) times daily as needed for anxiety., Disp: 60 tablet, Rfl: 2   Menthol (COUGH DROPS) 5.8 MG LOZG, Use as directed 1 lozenge (5.8 mg total) in the mouth or throat as needed (for dry, sore, or scratchy throat)., Disp: 36 lozenge, Rfl: 11   metoprolol tartrate (LOPRESSOR) 25 MG tablet, Take 25 mg by mouth 2 (two) times daily., Disp: , Rfl:    mirtazapine (REMERON) 45 MG tablet, Take 1 tablet (45 mg total) by mouth at bedtime., Disp: 90 tablet, Rfl: 3   Multiple Vitamin (MULTIVITAMIN WITH MINERALS) TABS tablet, Take 1 tablet by mouth in the morning., Disp: , Rfl:     OXYGEN, Inhale 2 L into the lungs daily as needed (shortness of breath or activity level)., Disp: , Rfl:    pantoprazole (PROTONIX) 20 MG tablet, Take 1 tablet (20 mg total) by mouth daily., Disp: 90 tablet, Rfl: 1   sertraline (ZOLOFT) 100 MG tablet, Take 1 tablet (100 mg total) by mouth at bedtime., Disp: 90 tablet, Rfl: 3   Spacer/Aero-Holding Chambers (AEROCHAMBER MV) inhaler, Use as instructed, Disp: 1 each, Rfl: 0   torsemide (DEMADEX) 20 MG tablet, TAKE 2 TABLETS DAILY EVERY MORNING. TAKE AN ADDITIONAL 2 TABLETS AS NEEDED FOR WEIGHT GAIN OF 2 POUNDS OVERNIGHT OR 5 POUNDS IN ONE WEEK., Disp: 225 tablet, Rfl: 5   azithromycin (ZITHROMAX) 250 MG tablet, Take as directed (Patient not taking: Reported on 09/29/2022), Disp: 6 tablet, Rfl: 0   potassium chloride 20 MEQ TBCR, Take 20 mEq by mouth daily., Disp: 90 tablet, Rfl: 3   predniSONE (DELTASONE) 10 MG tablet, Take 4 tablets (40 mg total) by mouth daily with breakfast. (Patient not taking: Reported on 09/29/2022), Disp: 20 tablet, Rfl: 0   sucralfate (CARAFATE) 1 g tablet, Take 1 tablet (1 g total) by mouth 4 (four) times daily -  with meals and at bedtime for 14 days., Disp: 56 tablet, Rfl: 0

## 2022-10-03 ENCOUNTER — Other Ambulatory Visit (HOSPITAL_COMMUNITY): Payer: Self-pay

## 2022-10-04 ENCOUNTER — Ambulatory Visit: Payer: Medicare HMO | Admitting: Medical

## 2022-10-06 ENCOUNTER — Encounter: Payer: Self-pay | Admitting: Pulmonary Disease

## 2022-10-06 DIAGNOSIS — J4540 Moderate persistent asthma, uncomplicated: Secondary | ICD-10-CM

## 2022-10-10 DIAGNOSIS — M2011 Hallux valgus (acquired), right foot: Secondary | ICD-10-CM | POA: Diagnosis not present

## 2022-10-10 DIAGNOSIS — I739 Peripheral vascular disease, unspecified: Secondary | ICD-10-CM | POA: Diagnosis not present

## 2022-10-10 DIAGNOSIS — B351 Tinea unguium: Secondary | ICD-10-CM | POA: Diagnosis not present

## 2022-10-10 DIAGNOSIS — M79671 Pain in right foot: Secondary | ICD-10-CM | POA: Diagnosis not present

## 2022-10-10 DIAGNOSIS — M79672 Pain in left foot: Secondary | ICD-10-CM | POA: Diagnosis not present

## 2022-10-10 DIAGNOSIS — I83893 Varicose veins of bilateral lower extremities with other complications: Secondary | ICD-10-CM | POA: Diagnosis not present

## 2022-10-10 DIAGNOSIS — L600 Ingrowing nail: Secondary | ICD-10-CM | POA: Diagnosis not present

## 2022-10-10 DIAGNOSIS — M2012 Hallux valgus (acquired), left foot: Secondary | ICD-10-CM | POA: Diagnosis not present

## 2022-10-11 NOTE — Telephone Encounter (Signed)
Patient would like a prescription of her trelegy to be faxed to [facility]. It would be free that way. The fax number is [phone]

## 2022-10-17 ENCOUNTER — Telehealth: Payer: Self-pay | Admitting: Pulmonary Disease

## 2022-10-17 DIAGNOSIS — J441 Chronic obstructive pulmonary disease with (acute) exacerbation: Secondary | ICD-10-CM

## 2022-10-17 DIAGNOSIS — J4540 Moderate persistent asthma, uncomplicated: Secondary | ICD-10-CM

## 2022-10-17 MED ORDER — DOXYCYCLINE HYCLATE 100 MG PO TABS
100.0000 mg | ORAL_TABLET | Freq: Two times a day (BID) | ORAL | 0 refills | Status: DC
Start: 2022-10-17 — End: 2022-11-16

## 2022-10-17 MED ORDER — PREDNISONE 10 MG PO TABS
ORAL_TABLET | ORAL | 0 refills | Status: DC
Start: 2022-10-17 — End: 2023-03-22

## 2022-10-17 NOTE — Telephone Encounter (Signed)
Patient states having symptoms of shortness of breath and cough. Pharmacy is [facility]. Patient phone number is [phone].

## 2022-10-17 NOTE — Telephone Encounter (Signed)
Ok to send in script for Trelegy for manufacturer discount as Markus Daft was still going to be expensive for her.   I have sent in prescription for steroid taper and doxycyline for her increase in respiratory symptoms.   Dr. [name]

## 2022-10-17 NOTE — Telephone Encounter (Signed)
Having increase sob and cough for 2 days.  Can only walk short distances.  Used albuterol neb last night, may have helped some.  Using Trelegy inhaler daily.  Using mucinex DM, not helping.  Coughing up a lot of green mucous.  No fever, chills or body aches.  She has not had close contact with people that have covid.  Advised to have her daughter get her a covid test to take so we can rule that out. She verbalize understanding.  I let her know I would send a message to Dr. [name] and once we hear back from him we will call her back with his recommendations.  She said heard from the manufacturer of Trelegy and that she can get it for free instead of paying $150, all they need is a prescription.  I see from your last note on 9/6 she was to stop Trelegy and start Markus Daft, please advise on script being sent to manufacture of Trelelgy.  Dr. [name], please advise.  Thank you.

## 2022-10-18 MED ORDER — TRELEGY ELLIPTA 100-62.5-25 MCG/ACT IN AEPB
1.0000 | INHALATION_SPRAY | Freq: Every day | RESPIRATORY_TRACT | 3 refills | Status: DC
Start: 2022-10-18 — End: 2023-08-17

## 2022-10-18 NOTE — Telephone Encounter (Signed)
Spoke with patient and advised rx's were sent to pharmacy. She states she will receive Trelegy through [facility], needs rx sent to them.   Rx printed for Dewald to sign. Left in folder for front staff to fax.

## 2022-10-19 DIAGNOSIS — M62511 Muscle wasting and atrophy, not elsewhere classified, right shoulder: Secondary | ICD-10-CM | POA: Diagnosis not present

## 2022-10-19 DIAGNOSIS — M62512 Muscle wasting and atrophy, not elsewhere classified, left shoulder: Secondary | ICD-10-CM | POA: Diagnosis not present

## 2022-10-19 DIAGNOSIS — R296 Repeated falls: Secondary | ICD-10-CM | POA: Diagnosis not present

## 2022-10-19 DIAGNOSIS — R278 Other lack of coordination: Secondary | ICD-10-CM | POA: Diagnosis not present

## 2022-10-23 ENCOUNTER — Telehealth: Payer: Self-pay | Admitting: Nurse Practitioner

## 2022-10-23 NOTE — Telephone Encounter (Signed)
PT ASSISTANCE ELIQUIS- Received fax stating Alver Fisher had received application but was missing Provider portion, pt must have sent in application.  Provider portion was completed & faxed.

## 2022-10-23 NOTE — Telephone Encounter (Signed)
This has been sent on 9/25. Nfn

## 2022-10-24 DIAGNOSIS — R0602 Shortness of breath: Secondary | ICD-10-CM | POA: Diagnosis not present

## 2022-10-24 DIAGNOSIS — R059 Cough, unspecified: Secondary | ICD-10-CM | POA: Diagnosis not present

## 2022-10-24 DIAGNOSIS — R519 Headache, unspecified: Secondary | ICD-10-CM | POA: Diagnosis not present

## 2022-10-24 DIAGNOSIS — Z20822 Contact with and (suspected) exposure to covid-19: Secondary | ICD-10-CM | POA: Diagnosis not present

## 2022-10-25 DIAGNOSIS — M62512 Muscle wasting and atrophy, not elsewhere classified, left shoulder: Secondary | ICD-10-CM | POA: Diagnosis not present

## 2022-10-25 DIAGNOSIS — M62511 Muscle wasting and atrophy, not elsewhere classified, right shoulder: Secondary | ICD-10-CM | POA: Diagnosis not present

## 2022-10-25 DIAGNOSIS — R278 Other lack of coordination: Secondary | ICD-10-CM | POA: Diagnosis not present

## 2022-10-25 DIAGNOSIS — R296 Repeated falls: Secondary | ICD-10-CM | POA: Diagnosis not present

## 2022-10-26 DIAGNOSIS — R296 Repeated falls: Secondary | ICD-10-CM | POA: Diagnosis not present

## 2022-10-26 DIAGNOSIS — M62512 Muscle wasting and atrophy, not elsewhere classified, left shoulder: Secondary | ICD-10-CM | POA: Diagnosis not present

## 2022-10-26 DIAGNOSIS — M62511 Muscle wasting and atrophy, not elsewhere classified, right shoulder: Secondary | ICD-10-CM | POA: Diagnosis not present

## 2022-10-26 DIAGNOSIS — R278 Other lack of coordination: Secondary | ICD-10-CM | POA: Diagnosis not present

## 2022-10-27 DIAGNOSIS — M62511 Muscle wasting and atrophy, not elsewhere classified, right shoulder: Secondary | ICD-10-CM | POA: Diagnosis not present

## 2022-10-27 DIAGNOSIS — R296 Repeated falls: Secondary | ICD-10-CM | POA: Diagnosis not present

## 2022-10-27 DIAGNOSIS — R278 Other lack of coordination: Secondary | ICD-10-CM | POA: Diagnosis not present

## 2022-10-27 DIAGNOSIS — M62512 Muscle wasting and atrophy, not elsewhere classified, left shoulder: Secondary | ICD-10-CM | POA: Diagnosis not present

## 2022-10-30 ENCOUNTER — Ambulatory Visit (HOSPITAL_BASED_OUTPATIENT_CLINIC_OR_DEPARTMENT_OTHER): Payer: Medicare HMO | Admitting: Family

## 2022-10-30 ENCOUNTER — Encounter (HOSPITAL_BASED_OUTPATIENT_CLINIC_OR_DEPARTMENT_OTHER): Payer: Self-pay

## 2022-10-30 DIAGNOSIS — R278 Other lack of coordination: Secondary | ICD-10-CM | POA: Diagnosis not present

## 2022-10-30 DIAGNOSIS — M62511 Muscle wasting and atrophy, not elsewhere classified, right shoulder: Secondary | ICD-10-CM | POA: Diagnosis not present

## 2022-10-30 DIAGNOSIS — R296 Repeated falls: Secondary | ICD-10-CM | POA: Diagnosis not present

## 2022-10-30 DIAGNOSIS — M62512 Muscle wasting and atrophy, not elsewhere classified, left shoulder: Secondary | ICD-10-CM | POA: Diagnosis not present

## 2022-10-30 NOTE — Progress Notes (Deleted)
Cardiology Office Note:  .   Date:  10/30/2022  ID:  Lindsay [name], DOB Mar 21, 1940, MRN [id number] PCP: Tollie Eth, NP  [facility] Providers Cardiologist:  Chrystie Nose, MD { Click to update primary MD,subspecialty MD or APP then REFRESH:1}   History of Present Illness: .   Lindsay [name] is a 82 y.o. 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   She has been predominantly managed by primary care. Her daughter is an Char[employer] fundraiser. Atrial fibrillation diagnosed many years ago and Eliquis initiated by primary care. Managed on Torsemide for chronic edema with improvement in fall 2021 after addition of Metolazone.    She was admitted 08/02/20 with acute on chronic hypoxic respiratory failure due to acute on chronic diastolic heart failure. 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 Treated with IV diuresis. She was evaluated by speech therapy and also underwent endoscopy with achalasia recommended for soft diet. She was discharged with [facility].   Underwent successful cardioversion 09/13/2020.   Admitted 04/2021 with acute on chronic respiratory failure and asthma exacerbation.  Echocardiogram 05/10/2021 during admission with LVEF 60 to 65%, no RWMA, normal pulmonary pressure, left atrium mildly dilated, trivial MR.   Seen 01/02/2022 with rate controlled atrial fibrillation with minimal symptomology recommended for continued rate control.  She was provided fluconazole for oral thrush.   ED visit 05/01/2022 for cough, dysphagia and was referred to GI.  She was placed on soft diet due to esophageal dysmotility.   Last seen 06/30/22. She had abrasion of L shin followed by wound clinic.  She was euvolemic on present dose of torsemide 40 mg daily with  additional 40 mg as needed continued.  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   She presents today for follow up with her daughter.  She resides in independent living facility and very much enjoys the activities the new Psychologist, counselling as added such as daily seated exercise class, chair beach volleyball.  Taking 2 torsemide and potassium every morning. Out of a week takes the extra Torsemide dose 3-4 times. Unaware of atrial fibrillation. Denies bleeding complications on Eliquis.  Denies chest pain, pressure, tightness.  No orthopnea, PND.      ROS: Please see the history of present illness.    All other systems reviewed and are negative.   Studies Reviewed: .        Cardiac Studies & Procedures       ECHOCARDIOGRAM  ECHOCARDIOGRAM COMPLETE 05/10/2021  Narrative ECHOCARDIOGRAM REPORT    Patient Name:   Lindsay [name] Date of Exam: 05/10/2021 Medical Rec #:  [id number]  Height:       68.0 in Accession #:    [id number] Weight:       232.8 lb Date of Birth:  05/01/1940  BSA:          2.180 m Patient Age:    80 years   BP:           103/63 mmHg Patient Gender: F          HR:           91 bpm. Exam Location:  Inpatient  Procedure: 2D Echo,  Color Doppler and Cardiac Doppler  Indications:    CHF  History:        Patient has prior history of Echocardiogram examinations. CHF, COPD; Arrythmias:Atrial Fibrillation.  Sonographer:    Cleatis Polka Referring Phys: [id number] Teddy Spike   Sonographer Comments: Suboptimal parasternal window. Image acquisition challenging due to patient body habitus. IMPRESSIONS   1. Left ventricular ejection fraction, by estimation, is 60 to 65%. The left ventricle has normal function. The left ventricle has no regional wall motion abnormalities. Left ventricular diastolic function could not be  evaluated. 2. Right ventricular systolic function is normal. The right ventricular size is normal. There is normal pulmonary artery systolic pressure. The estimated right ventricular systolic pressure is 31.8 mmHg. 3. Left atrial size was mildly dilated. 4. The mitral valve is grossly normal. Trivial mitral valve regurgitation. No evidence of mitral stenosis. 5. The aortic valve was not well visualized. Aortic valve regurgitation is not visualized. Aortic valve sclerosis is present, with no evidence of aortic valve stenosis. 6. The inferior vena cava is dilated in size with >50% respiratory variability, suggesting right atrial pressure of 8 mmHg.  Comparison(s): No significant change from prior study.  FINDINGS Left Ventricle: Left ventricular ejection fraction, by estimation, is 60 to 65%. The left ventricle has normal function. The left ventricle has no regional wall motion abnormalities. The left ventricular internal cavity size was normal in size. There is no left ventricular hypertrophy. Left ventricular diastolic function could not be evaluated due to atrial fibrillation. Left ventricular diastolic function could not be evaluated.  Right Ventricle: The right ventricular size is normal. No increase in right ventricular wall thickness. Right ventricular systolic function is normal. There is normal pulmonary artery systolic pressure. The tricuspid regurgitant velocity is 2.44 m/s, and with an assumed right atrial pressure of 8 mmHg, the estimated right ventricular systolic pressure is 31.8 mmHg.  Left Atrium: Left atrial size was mildly dilated.  Right Atrium: Right atrial size was normal in size.  Pericardium: There is no evidence of pericardial effusion. Presence of epicardial fat layer.  Mitral Valve: The mitral valve is grossly normal. Trivial mitral valve regurgitation. No evidence of mitral valve stenosis.  Tricuspid Valve: The tricuspid valve is grossly normal. Tricuspid valve  regurgitation is mild . No evidence of tricuspid stenosis.  Aortic Valve: The aortic valve was not well visualized. Aortic valve regurgitation is not visualized. Aortic valve sclerosis is present, with no evidence of aortic valve stenosis. Aortic valve peak gradient measures 3.9 mmHg.  Pulmonic Valve: The pulmonic valve was grossly normal. Pulmonic valve regurgitation is trivial. No evidence of pulmonic stenosis.  Aorta: The aortic root and ascending aorta are structurally normal, with no evidence of dilitation.  Venous: The inferior vena cava is dilated in size with greater than 50% respiratory variability, suggesting right atrial pressure of 8 mmHg.  IAS/Shunts: The atrial septum is grossly normal.   LEFT VENTRICLE PLAX 2D LVIDd:         4.20 cm     Diastology LVIDs:         3.20 cm     LV e' medial:    5.87 cm/s LV PW:         1.10 cm     LV E/e' medial:  13.4 LV IVS:        1.20 cm     LV e' lateral:   9.79 cm/s LVOT diam:     2.00 cm     LV E/e' lateral:  8.0 LV SV:         60 LV SV Index:   28 LVOT Area:     3.14 cm  LV Volumes (MOD) LV vol d, MOD A2C: 69.8 ml LV vol d, MOD A4C: 83.9 ml LV vol s, MOD A2C: 27.7 ml LV vol s, MOD A4C: 31.4 ml LV SV MOD A2C:     42.1 ml LV SV MOD A4C:     83.9 ml LV SV MOD BP:      49.3 ml  RIGHT VENTRICLE            IVC RV Basal diam:  3.70 cm    IVC diam: 2.10 cm RV Mid diam:    3.10 cm RV S prime:     7.18 cm/s TAPSE (M-mode): 1.5 cm  LEFT ATRIUM             Index        RIGHT ATRIUM           Index LA diam:        4.00 cm 1.83 cm/m   RA Area:     21.60 cm LA Vol (A2C):   80.1 ml 36.74 ml/m  RA Volume:   56.50 ml  25.92 ml/m LA Vol (A4C):   77.3 ml 35.46 ml/m LA Biplane Vol: 80.3 ml 36.83 ml/m AORTIC VALVE AV Area (Vmax): 3.32 cm AV Vmax:        99.30 cm/s AV Peak Grad:   3.9 mmHg LVOT Vmax:      105.00 cm/s LVOT Vmean:     76.000 cm/s LVOT VTI:       0.192 m  AORTA Ao Root diam: 3.60 cm Ao Asc diam:  3.50  cm  MITRAL VALVE               TRICUSPID VALVE MV Area (PHT): 6.02 cm    TR Peak grad:   23.8 mmHg MV Decel Time: 126 msec    TR Vmax:        244.00 cm/s MV E velocity: 78.80 cm/s MV A velocity: 32.60 cm/s  SHUNTS MV E/A ratio:  2.42        Systemic VTI:  0.19 m Systemic Diam: 2.00 cm  Lennie Odor MD Electronically signed by Lennie Odor MD Signature Date/Time: 05/10/2021/3:09:50 PM    Final             Risk Assessment/Calculations:   {Does this patient have ATRIAL FIBRILLATION?[phone]} No BP recorded.  {Refresh Note OR Click here to enter BP  :1}***       Physical Exam:   VS:  There were no vitals taken for this visit.   Wt Readings from Last 3 Encounters:  09/29/22 243 lb (110.2 kg)  08/01/22 230 lb (104.3 kg)  07/11/22 240 lb (108.9 kg)    GEN: Well nourished, well developed in no acute distress NECK: No JVD; No carotid bruits CARDIAC: ***RRR, no murmurs, rubs, gallops RESPIRATORY:  Clear to auscultation without rales, wheezing or rhonchi  ABDOMEN: Soft, non-tender, non-distended EXTREMITIES:  No edema; No deformity   ASSESSMENT AND PLAN: .   Non healing LLE wound - Abrasian to left shin presently wrapped with nonadherent gauze, bandage, and ACE wrap. ACE wrap re-wrapped today. Following with wound clinic. NOtes appointments are difficult to get to. Previously completed [employer] application but status unknown, will message [facility] to inquire next steps. Discussed with Rutherford Nail, LCSW. She will call number on back of her insurance card to inquire  about ride benefits. Will also reach out to [facility] to see if they have additional transportation resources given necessity of wound clinic appointments (unfortunately local centers with no availability for 3-4 months so is travelling to [place] for care).    Chronic hypoxic respiratory failure / HFpEF -Volume status difficult to ascertain based on body habitus.  Notes her exertional dyspnea and lower extremity edema are stable at  baseline.  Continue torsemide 40 mg daily with additional 40 mg in the afternoon as needed for lower extremity edema. Low sodium diet, fluid restriction <2L, and daily weights encouraged. Educated to contact our office for weight gain of 2 lbs overnight or 5 lbs in one week. .    Atrial fibrillation / Chronic anticoagulation - Plan for continued rate control. Continue present dose of Toprol 25 mg daily.  Continue Eliquis 5 mg twice daily. CHA2DS2-VASc Score = 6 [CHF History: 1, HTN History: 1, Diabetes History: 0, Stroke History: 0, Vascular Disease History: 1, Age Score: 2, Gender Score: 1].   Echo 04/2021 normal LVEF.     Chronic calcification on CT/Aortic atherosclerosis/HLD, LDL goal <70 -continue Atorvastatin. Direct LDL today. 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 Continue optimal BP control and Toprol. Recommend avoidance of fluoroquinolones.  Repeat CT or MRA in 6 months for monitoring. This can be coordinated at follow up.    Hyokalemia -Likely due to diuretic therapy.  Continue present potassium supplement.  BMP for monitoring.   Obesity - Weight loss via diet and exercise encouraged. Discussed the impact being overweight would have on cardiovascular risk, atrial fibrillation, HLD, and HFpEF. Encouraged to continue participating in her chair exercise classes at [facility].       {Are you ordering a CV Procedure (e.g. stress test, cath, DCCV, TEE, etc)?   Press F2        :[id number]}  Dispo: ***  Signed, Alver Sorrow, NP

## 2022-11-01 DIAGNOSIS — M62511 Muscle wasting and atrophy, not elsewhere classified, right shoulder: Secondary | ICD-10-CM | POA: Diagnosis not present

## 2022-11-01 DIAGNOSIS — M62512 Muscle wasting and atrophy, not elsewhere classified, left shoulder: Secondary | ICD-10-CM | POA: Diagnosis not present

## 2022-11-01 DIAGNOSIS — R296 Repeated falls: Secondary | ICD-10-CM | POA: Diagnosis not present

## 2022-11-01 DIAGNOSIS — R278 Other lack of coordination: Secondary | ICD-10-CM | POA: Diagnosis not present

## 2022-11-02 DIAGNOSIS — R296 Repeated falls: Secondary | ICD-10-CM | POA: Diagnosis not present

## 2022-11-02 DIAGNOSIS — M62512 Muscle wasting and atrophy, not elsewhere classified, left shoulder: Secondary | ICD-10-CM | POA: Diagnosis not present

## 2022-11-02 DIAGNOSIS — M62511 Muscle wasting and atrophy, not elsewhere classified, right shoulder: Secondary | ICD-10-CM | POA: Diagnosis not present

## 2022-11-02 DIAGNOSIS — R278 Other lack of coordination: Secondary | ICD-10-CM | POA: Diagnosis not present

## 2022-11-02 NOTE — Telephone Encounter (Signed)
Response received from [facility] that need documentation of 3% out of pocket expenses.  Called pt and she says she has that & will have her daughter fax to [place] here.   Marylene Land I'm going ahead & sending pt to you & she is already approved for PAP for Trelegy

## 2022-11-04 NOTE — Progress Notes (Unsigned)
Cardiology Office Note:  .   Date:  11/06/2022  ID:  Lindsay [name], DOB [birth year], MRN [id number] PCP: Tollie Eth, NP  [facility] Providers Cardiologist:  Chrystie Nose, MD History of Present Illness: .   Lindsay [name] is a 82 y.o. 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  Per chart review, patient has been predominantly followed by her primary care provider.  Atrial fibrillation was diagnosed many years ago and she has been on Eliquis.  Also has been on torsemide for chronic edema.  Had significant improvement in edema in 2021 after addition of metolazone.  Patient was admitted in 07/2020 with acute on chronic hypoxic respiratory failure secondary to acute on chronic diastolic heart failure.  Echocardiogram on 08/03/2020 showed EF 60-65%, no regional wall motion abnormalities, normal RV function.  She was treated with IV diuresis.  During that admission, patient also had achalasia and dysphagia.  Was evaluated by sleep therapy and was recommended a soft diet.  Discharged with home health.  She was seen back in clinic on 08/24/2020.  At that time, patient remained in atrial fibrillation that was well rate controlled.  Recommended she undergo cardioversion to restore normal sinus rhythm.  She underwent outpatient DCCV on 09/13/2020 successful conversion to normal sinus rhythm.  After her cardioversion, patient was lost to follow-up.  Patient was admitted in 04/2021 with asthma exacerbation.  Echocardiogram/18/23 showed EF 60-65, no regional wall motion abnormalities, normal pulmonary pressure, trivial MR.  She was last seen by cardiology on 06/30/2022.  At that time, patient was doing well from a cardiac standpoint.  She did have a nonhealing left lower extremity wound  and has been followed with the wound clinic, but was having a difficult time getting to her wound care appointments.  She was on torsemide 40 mg daily with an additional 40 mg as needed for lower extremity edema.  Remained on Toprol 25 mg daily and Eliquis 5 mg twice daily for atrial fibrillation.  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  Recommended semiannual imaging follow-up with CTA versus MRI.  Today, patient overall feels well. When her oxygen level was first checked it was 83%. On recheck after she took a few deep breaths through her nose, it improved to 92%. Patient is usually on 2 L oxygen at home, and is only on 1 L today in office because she is worried that her tank would run out. She reports that about 2 weeks ago, she had an episode of shortness of breath, cough. Her pulmonologist started her on antibiotics and steroids, and her breathing improved. Breathing feels normal today. She denies chest pain, palpitations. She has been taking torsemide 40 mg daily. If her left swelling is not gone by the afternoon, she will take a second dose of torsemide 40 mg daily. Takes an extra dose once every few weeks or so.   ROS: Denies chest pain, palpitations, orthopnea. Has chronic shortness of breath and wears 2 L oxygen at home.   Studies Reviewed: .   Cardiac Studies & Procedures       ECHOCARDIOGRAM  ECHOCARDIOGRAM COMPLETE 05/10/2021  Narrative ECHOCARDIOGRAM REPORT    Patient Name:   Lindsay [name] Date of Exam: 05/10/2021 Medical Rec #:  [id number]  Height:  Cardiology Office Note:  .   Date:  11/06/2022  ID:  Lindsay [name], DOB [birth year], MRN [id number] PCP: Tollie Eth, NP  [facility] Providers Cardiologist:  Chrystie Nose, MD History of Present Illness: .   Lindsay [name] is a 82 y.o. 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  Per chart review, patient has been predominantly followed by her primary care provider.  Atrial fibrillation was diagnosed many years ago and she has been on Eliquis.  Also has been on torsemide for chronic edema.  Had significant improvement in edema in 2021 after addition of metolazone.  Patient was admitted in 07/2020 with acute on chronic hypoxic respiratory failure secondary to acute on chronic diastolic heart failure.  Echocardiogram on 08/03/2020 showed EF 60-65%, no regional wall motion abnormalities, normal RV function.  She was treated with IV diuresis.  During that admission, patient also had achalasia and dysphagia.  Was evaluated by sleep therapy and was recommended a soft diet.  Discharged with home health.  She was seen back in clinic on 08/24/2020.  At that time, patient remained in atrial fibrillation that was well rate controlled.  Recommended she undergo cardioversion to restore normal sinus rhythm.  She underwent outpatient DCCV on 09/13/2020 successful conversion to normal sinus rhythm.  After her cardioversion, patient was lost to follow-up.  Patient was admitted in 04/2021 with asthma exacerbation.  Echocardiogram/18/23 showed EF 60-65, no regional wall motion abnormalities, normal pulmonary pressure, trivial MR.  She was last seen by cardiology on 06/30/2022.  At that time, patient was doing well from a cardiac standpoint.  She did have a nonhealing left lower extremity wound  and has been followed with the wound clinic, but was having a difficult time getting to her wound care appointments.  She was on torsemide 40 mg daily with an additional 40 mg as needed for lower extremity edema.  Remained on Toprol 25 mg daily and Eliquis 5 mg twice daily for atrial fibrillation.  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  Recommended semiannual imaging follow-up with CTA versus MRI.  Today, patient overall feels well. When her oxygen level was first checked it was 83%. On recheck after she took a few deep breaths through her nose, it improved to 92%. Patient is usually on 2 L oxygen at home, and is only on 1 L today in office because she is worried that her tank would run out. She reports that about 2 weeks ago, she had an episode of shortness of breath, cough. Her pulmonologist started her on antibiotics and steroids, and her breathing improved. Breathing feels normal today. She denies chest pain, palpitations. She has been taking torsemide 40 mg daily. If her left swelling is not gone by the afternoon, she will take a second dose of torsemide 40 mg daily. Takes an extra dose once every few weeks or so.   ROS: Denies chest pain, palpitations, orthopnea. Has chronic shortness of breath and wears 2 L oxygen at home.   Studies Reviewed: .   Cardiac Studies & Procedures       ECHOCARDIOGRAM  ECHOCARDIOGRAM COMPLETE 05/10/2021  Narrative ECHOCARDIOGRAM REPORT    Patient Name:   Lindsay [name] Date of Exam: 05/10/2021 Medical Rec #:  [id number]  Height:  Cardiology Office Note:  .   Date:  11/06/2022  ID:  Lindsay [name], DOB [birth year], MRN [id number] PCP: Tollie Eth, NP  [facility] Providers Cardiologist:  Chrystie Nose, MD History of Present Illness: .   Lindsay [name] is a 82 y.o. 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  Per chart review, patient has been predominantly followed by her primary care provider.  Atrial fibrillation was diagnosed many years ago and she has been on Eliquis.  Also has been on torsemide for chronic edema.  Had significant improvement in edema in 2021 after addition of metolazone.  Patient was admitted in 07/2020 with acute on chronic hypoxic respiratory failure secondary to acute on chronic diastolic heart failure.  Echocardiogram on 08/03/2020 showed EF 60-65%, no regional wall motion abnormalities, normal RV function.  She was treated with IV diuresis.  During that admission, patient also had achalasia and dysphagia.  Was evaluated by sleep therapy and was recommended a soft diet.  Discharged with home health.  She was seen back in clinic on 08/24/2020.  At that time, patient remained in atrial fibrillation that was well rate controlled.  Recommended she undergo cardioversion to restore normal sinus rhythm.  She underwent outpatient DCCV on 09/13/2020 successful conversion to normal sinus rhythm.  After her cardioversion, patient was lost to follow-up.  Patient was admitted in 04/2021 with asthma exacerbation.  Echocardiogram/18/23 showed EF 60-65, no regional wall motion abnormalities, normal pulmonary pressure, trivial MR.  She was last seen by cardiology on 06/30/2022.  At that time, patient was doing well from a cardiac standpoint.  She did have a nonhealing left lower extremity wound  and has been followed with the wound clinic, but was having a difficult time getting to her wound care appointments.  She was on torsemide 40 mg daily with an additional 40 mg as needed for lower extremity edema.  Remained on Toprol 25 mg daily and Eliquis 5 mg twice daily for atrial fibrillation.  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  Recommended semiannual imaging follow-up with CTA versus MRI.  Today, patient overall feels well. When her oxygen level was first checked it was 83%. On recheck after she took a few deep breaths through her nose, it improved to 92%. Patient is usually on 2 L oxygen at home, and is only on 1 L today in office because she is worried that her tank would run out. She reports that about 2 weeks ago, she had an episode of shortness of breath, cough. Her pulmonologist started her on antibiotics and steroids, and her breathing improved. Breathing feels normal today. She denies chest pain, palpitations. She has been taking torsemide 40 mg daily. If her left swelling is not gone by the afternoon, she will take a second dose of torsemide 40 mg daily. Takes an extra dose once every few weeks or so.   ROS: Denies chest pain, palpitations, orthopnea. Has chronic shortness of breath and wears 2 L oxygen at home.   Studies Reviewed: .   Cardiac Studies & Procedures       ECHOCARDIOGRAM  ECHOCARDIOGRAM COMPLETE 05/10/2021  Narrative ECHOCARDIOGRAM REPORT    Patient Name:   Lindsay [name] Date of Exam: 05/10/2021 Medical Rec #:  [id number]  Height:  68.0 in Accession #:    [id number] Weight:       232.8 lb Date of Birth:  August 28, 1940  BSA:          2.180 m Patient Age:    80 years   BP:           103/63 mmHg Patient Gender: F          HR:           91 bpm. Exam Location:  Inpatient  Procedure: 2D Echo, Color Doppler and Cardiac Doppler  Indications:    CHF  History:        Patient has prior history of  Echocardiogram examinations. CHF, COPD; Arrythmias:Atrial Fibrillation.  Sonographer:    Cleatis Polka Referring Phys: [id number] Teddy Spike   Sonographer Comments: Suboptimal parasternal window. Image acquisition challenging due to patient body habitus. IMPRESSIONS   1. Left ventricular ejection fraction, by estimation, is 60 to 65%. The left ventricle has normal function. The left ventricle has no regional wall motion abnormalities. Left ventricular diastolic function could not be evaluated. 2. Right ventricular systolic function is normal. The right ventricular size is normal. There is normal pulmonary artery systolic pressure. The estimated right ventricular systolic pressure is 31.8 mmHg. 3. Left atrial size was mildly dilated. 4. The mitral valve is grossly normal. Trivial mitral valve regurgitation. No evidence of mitral stenosis. 5. The aortic valve was not well visualized. Aortic valve regurgitation is not visualized. Aortic valve sclerosis is present, with no evidence of aortic valve stenosis. 6. The inferior vena cava is dilated in size with >50% respiratory variability, suggesting right atrial pressure of 8 mmHg.  Comparison(s): No significant change from prior study.  FINDINGS Left Ventricle: Left ventricular ejection fraction, by estimation, is 60 to 65%. The left ventricle has normal function. The left ventricle has no regional wall motion abnormalities. The left ventricular internal cavity size was normal in size. There is no left ventricular hypertrophy. Left ventricular diastolic function could not be evaluated due to atrial fibrillation. Left ventricular diastolic function could not be evaluated.  Right Ventricle: The right ventricular size is normal. No increase in right ventricular wall thickness. Right ventricular systolic function is normal. There is normal pulmonary artery systolic pressure. The tricuspid regurgitant velocity is 2.44 m/s, and with an assumed right  atrial pressure of 8 mmHg, the estimated right ventricular systolic pressure is 31.8 mmHg.  Left Atrium: Left atrial size was mildly dilated.  Right Atrium: Right atrial size was normal in size.  Pericardium: There is no evidence of pericardial effusion. Presence of epicardial fat layer.  Mitral Valve: The mitral valve is grossly normal. Trivial mitral valve regurgitation. No evidence of mitral valve stenosis.  Tricuspid Valve: The tricuspid valve is grossly normal. Tricuspid valve regurgitation is mild . No evidence of tricuspid stenosis.  Aortic Valve: The aortic valve was not well visualized. Aortic valve regurgitation is not visualized. Aortic valve sclerosis is present, with no evidence of aortic valve stenosis. Aortic valve peak gradient measures 3.9 mmHg.  Pulmonic Valve: The pulmonic valve was grossly normal. Pulmonic valve regurgitation is trivial. No evidence of pulmonic stenosis.  Aorta: The aortic root and ascending aorta are structurally normal, with no evidence of dilitation.  Venous: The inferior vena cava is dilated in size with greater than 50% respiratory variability, suggesting right atrial pressure of 8 mmHg.  IAS/Shunts: The atrial septum is grossly normal.   LEFT VENTRICLE PLAX 2D LVIDd:

## 2022-11-06 ENCOUNTER — Ambulatory Visit: Payer: Medicare HMO | Attending: Family | Admitting: Cardiology

## 2022-11-06 ENCOUNTER — Encounter: Payer: Self-pay | Admitting: Cardiology

## 2022-11-06 VITALS — BP 98/60 | HR 73 | Ht 68.0 in | Wt 245.0 lb

## 2022-11-06 DIAGNOSIS — E785 Hyperlipidemia, unspecified: Secondary | ICD-10-CM | POA: Diagnosis not present

## 2022-11-06 DIAGNOSIS — I2510 Atherosclerotic heart disease of native coronary artery without angina pectoris: Secondary | ICD-10-CM

## 2022-11-06 DIAGNOSIS — I7122 Aneurysm of the aortic arch, without rupture: Secondary | ICD-10-CM | POA: Diagnosis not present

## 2022-11-06 DIAGNOSIS — R7303 Prediabetes: Secondary | ICD-10-CM | POA: Diagnosis not present

## 2022-11-06 MED ORDER — METOPROLOL TARTRATE 100 MG PO TABS: 100.0000 mg | ORAL_TABLET | Freq: Two times a day (BID) | ORAL | 0 refills | Status: DC

## 2022-11-06 NOTE — Patient Instructions (Signed)
Medication: Take One dose of Metoprolol Tartrate 100 Mg 2 hours prior to scan. *If you need a refill on your cardiac medications before your next appointment, please call your pharmacy*   Lab Work: Today: BMP A1C If you have labs (blood work) drawn today and your tests are completely normal, you will receive your results only by: MyChart Message (if you have MyChart) OR A paper copy in the mail If you have any lab test that is abnormal or we need to change your treatment, we will call you to review the results.   Testing:  Non-Cardiac CT Angiography (CTA), is a special type of CT scan that uses a computer to produce multi-dimensional views of major blood vessels throughout the body. In CT angiography, a contrast material is injected through an IV to help visualize the blood vessels  Follow-Up: At [facility], you and your health needs are our priority.  As part of our continuing mission to provide you with exceptional heart care, we have created designated Provider Care Teams.  These Care Teams include your primary Cardiologist (physician) and Advanced Practice Providers (APPs -  Physician Assistants and Nurse Practitioners) who all work together to provide you with the care you need, when you need it.  We recommend signing up for the patient portal called "MyChart".  Sign up information is provided on this After Visit Summary.  MyChart is used to connect with patients for Virtual Visits (Telemedicine).  Patients are able to view lab/test results, encounter notes, upcoming appointments, etc.  Non-urgent messages can be sent to your provider as well.   To learn more about what you can do with MyChart, go to [email or web address].    Your next appointment:   4 month(s)  Provider:   Gillian Shields, NP

## 2022-11-07 ENCOUNTER — Other Ambulatory Visit (HOSPITAL_COMMUNITY): Payer: Self-pay | Admitting: Cardiology

## 2022-11-07 DIAGNOSIS — I7121 Aneurysm of the ascending aorta, without rupture: Secondary | ICD-10-CM

## 2022-11-07 LAB — HEPATIC FUNCTION PANEL
ALT: 21 [IU]/L (ref 0–32)
AST: 16 [IU]/L (ref 0–40)
Albumin: 3.7 g/dL (ref 3.7–4.7)
Alkaline Phosphatase: 92 [IU]/L (ref 44–121)
Bilirubin Total: 0.4 mg/dL (ref 0.0–1.2)
Bilirubin, Direct: 0.14 mg/dL (ref 0.00–0.40)
Total Protein: 6.1 g/dL (ref 6.0–8.5)

## 2022-11-07 LAB — LIPID PANEL
Chol/HDL Ratio: 2.8 {ratio} (ref 0.0–4.4)
Cholesterol, Total: 189 mg/dL (ref 100–199)
HDL: 68 mg/dL (ref >39)
LDL Chol Calc (NIH): 105 mg/dL — ABNORMAL HIGH (ref 0–99)
Triglycerides: 89 mg/dL (ref 0–149)
VLDL Cholesterol Cal: 16 mg/dL (ref 5–40)

## 2022-11-07 LAB — BASIC METABOLIC PANEL
BUN/Creatinine Ratio: 20 (ref 12–28)
BUN: 18 mg/dL (ref 8–27)
CO2: 29 mmol/L (ref 20–29)
Calcium: 9 mg/dL (ref 8.7–10.3)
Chloride: 105 mmol/L (ref 96–106)
Creatinine, Ser: 0.88 mg/dL (ref 0.57–1.00)
Glucose: 88 mg/dL (ref 70–99)
Potassium: 4.4 mmol/L (ref 3.5–5.2)
Sodium: 146 mmol/L — ABNORMAL HIGH (ref 134–144)
eGFR: 66 mL/min/{1.73_m2} (ref >59)

## 2022-11-07 LAB — HEMOGLOBIN A1C
Est. average glucose Bld gHb Est-mCnc: 126 mg/dL
Hgb A1c MFr Bld: 6 % — ABNORMAL HIGH (ref 4.8–5.6)

## 2022-11-07 LAB — SPECIMEN STATUS REPORT

## 2022-11-07 NOTE — Telephone Encounter (Signed)
-----   Message from Jonita Albee sent at 11/07/2022  7:57 AM EDT ----- Please tell patient that her lab work yesterday showed normal kidney function. OK to proceed with CTA chest. Her sodium is minimally elevated, potassium normal. Her A1c is 6.0, which makes her prediabetic. She should follow up with her PCP to discuss further. Until she sees her PCP, she should decrease her intake of simple carbohydrates (white breads, pastas) and sugary foods/drinks.  Also, she does not need metoprolol prior to CTA   Thanks KJ

## 2022-11-07 NOTE — Telephone Encounter (Signed)
Please tell patient that her lab work yesterday showed normal kidney function. OK to proceed with CTA chest. Her sodium is minimally elevated, potassium normal. Her A1c is 6.0, which makes her prediabetic. She should follow up with her PCP to discuss further. Until she sees her PCP, she should decrease her intake of simple carbohydrates (white breads, pastas) and sugary foods/drinks.   Also, she does not need metoprolol prior to CTA   Thanks  KJ \  Advised Patient of above they verbalized understanding.

## 2022-11-08 DIAGNOSIS — M62512 Muscle wasting and atrophy, not elsewhere classified, left shoulder: Secondary | ICD-10-CM | POA: Diagnosis not present

## 2022-11-08 DIAGNOSIS — R278 Other lack of coordination: Secondary | ICD-10-CM | POA: Diagnosis not present

## 2022-11-08 DIAGNOSIS — M62511 Muscle wasting and atrophy, not elsewhere classified, right shoulder: Secondary | ICD-10-CM | POA: Diagnosis not present

## 2022-11-08 DIAGNOSIS — R296 Repeated falls: Secondary | ICD-10-CM | POA: Diagnosis not present

## 2022-11-09 ENCOUNTER — Telehealth (HOSPITAL_BASED_OUTPATIENT_CLINIC_OR_DEPARTMENT_OTHER): Payer: Self-pay

## 2022-11-09 ENCOUNTER — Telehealth: Payer: Self-pay | Admitting: Pulmonary Disease

## 2022-11-09 DIAGNOSIS — M62511 Muscle wasting and atrophy, not elsewhere classified, right shoulder: Secondary | ICD-10-CM | POA: Diagnosis not present

## 2022-11-09 DIAGNOSIS — R278 Other lack of coordination: Secondary | ICD-10-CM | POA: Diagnosis not present

## 2022-11-09 DIAGNOSIS — R296 Repeated falls: Secondary | ICD-10-CM | POA: Diagnosis not present

## 2022-11-09 DIAGNOSIS — M62512 Muscle wasting and atrophy, not elsewhere classified, left shoulder: Secondary | ICD-10-CM | POA: Diagnosis not present

## 2022-11-09 MED ORDER — PREDNISONE 20 MG PO TABS
40.0000 mg | ORAL_TABLET | Freq: Every day | ORAL | 0 refills | Status: DC
Start: 2022-11-09 — End: 2022-11-16

## 2022-11-09 NOTE — Telephone Encounter (Signed)
Prednisone sent to pharmacy

## 2022-11-09 NOTE — Telephone Encounter (Signed)
Update: Patient approved to receive Eliquis through 01/23/2023.  Received OOP prescription expenses for 2024 so far from patient. Submitted to [employer]. Pending response. Case ID: PAT - [id number]

## 2022-11-09 NOTE — Telephone Encounter (Signed)
Patient is aware of below message/recommendations and voiced her understanding.  Nothing further needed.

## 2022-11-09 NOTE — Telephone Encounter (Addendum)
Seen by patient Lindsay [name] on 11/09/2022  3:35 PM; follow up mychart message sent to patient.    ----- Message from Alver Sorrow sent at 11/08/2022  9:33 PM EDT ----- Normal liver. LDL (bad cholesterol) pf 105 which is not at goal of <70. Her LDL only decreased 3 points with doubling dose of Atorvastatin. Please ensure taking Atorvastatin 40mg  daily and that she was fasting for labs.   If fasting and taking regularly, increase Atorvastatin to 80mg  daily with FLP/LFT in 2 months.  If not taking, resume and FLP/LFT in 2 months. If not fasting, repeat labs when fasting.

## 2022-11-09 NOTE — Telephone Encounter (Signed)
Patient states having symptoms of cough and headache. Taking Muccinex with no help. No available appointments at this time. Pharmacy is [facility]. Patient phone number is [phone].

## 2022-11-09 NOTE — Telephone Encounter (Signed)
Spoke with the pt  She is c/o HA and cough x 3 days  Her cough is occ prod with clear sputum  She is wheezing and has had increased SOB  She did PT today and sats with exertion were 88-91%2lpm  She is using her albuterol nebs, trelegy, mucinex  No appts open until Dec 2024  Please advise, thanks!  Allergies  Allergen Reactions   Acetaminophen-Codeine Other (See Comments)    Agitation   Naproxen-Esomeprazole Mg Diarrhea   Codeine Other (See Comments)    hallucinations

## 2022-11-10 ENCOUNTER — Inpatient Hospital Stay (HOSPITAL_BASED_OUTPATIENT_CLINIC_OR_DEPARTMENT_OTHER)
Admission: EM | Admit: 2022-11-10 | Discharge: 2022-11-16 | DRG: 291 | Disposition: A | Discharge status: Home Health | Payer: Medicare HMO | Attending: Internal Medicine | Admitting: Internal Medicine

## 2022-11-10 ENCOUNTER — Encounter (HOSPITAL_BASED_OUTPATIENT_CLINIC_OR_DEPARTMENT_OTHER): Payer: Self-pay | Admitting: Family Medicine

## 2022-11-10 ENCOUNTER — Emergency Department (HOSPITAL_BASED_OUTPATIENT_CLINIC_OR_DEPARTMENT_OTHER): Payer: Medicare HMO

## 2022-11-10 ENCOUNTER — Other Ambulatory Visit: Payer: Self-pay

## 2022-11-10 DIAGNOSIS — E039 Hypothyroidism, unspecified: Secondary | ICD-10-CM | POA: Diagnosis present

## 2022-11-10 DIAGNOSIS — Z886 Allergy status to analgesic agent status: Secondary | ICD-10-CM

## 2022-11-10 DIAGNOSIS — I358 Other nonrheumatic aortic valve disorders: Secondary | ICD-10-CM | POA: Diagnosis present

## 2022-11-10 DIAGNOSIS — E7800 Pure hypercholesterolemia, unspecified: Secondary | ICD-10-CM | POA: Diagnosis present

## 2022-11-10 DIAGNOSIS — Z1152 Encounter for screening for COVID-19: Secondary | ICD-10-CM

## 2022-11-10 DIAGNOSIS — J449 Chronic obstructive pulmonary disease, unspecified: Secondary | ICD-10-CM | POA: Diagnosis present

## 2022-11-10 DIAGNOSIS — R7989 Other specified abnormal findings of blood chemistry: Secondary | ICD-10-CM | POA: Diagnosis present

## 2022-11-10 DIAGNOSIS — I152 Hypertension secondary to endocrine disorders: Secondary | ICD-10-CM | POA: Diagnosis not present

## 2022-11-10 DIAGNOSIS — D513 Other dietary vitamin B12 deficiency anemia: Secondary | ICD-10-CM | POA: Diagnosis present

## 2022-11-10 DIAGNOSIS — Z9981 Dependence on supplemental oxygen: Secondary | ICD-10-CM

## 2022-11-10 DIAGNOSIS — J4540 Moderate persistent asthma, uncomplicated: Secondary | ICD-10-CM | POA: Diagnosis present

## 2022-11-10 DIAGNOSIS — J811 Chronic pulmonary edema: Secondary | ICD-10-CM | POA: Diagnosis not present

## 2022-11-10 DIAGNOSIS — J9622 Acute and chronic respiratory failure with hypercapnia: Secondary | ICD-10-CM | POA: Diagnosis not present

## 2022-11-10 DIAGNOSIS — Z7952 Long term (current) use of systemic steroids: Secondary | ICD-10-CM

## 2022-11-10 DIAGNOSIS — E66812 Obesity, class 2: Secondary | ICD-10-CM | POA: Diagnosis present

## 2022-11-10 DIAGNOSIS — J9811 Atelectasis: Secondary | ICD-10-CM | POA: Diagnosis present

## 2022-11-10 DIAGNOSIS — Z5982 Transportation insecurity: Secondary | ICD-10-CM

## 2022-11-10 DIAGNOSIS — J9621 Acute and chronic respiratory failure with hypoxia: Principal | ICD-10-CM | POA: Diagnosis present

## 2022-11-10 DIAGNOSIS — R9431 Abnormal electrocardiogram [ECG] [EKG]: Secondary | ICD-10-CM | POA: Diagnosis not present

## 2022-11-10 DIAGNOSIS — J441 Chronic obstructive pulmonary disease with (acute) exacerbation: Secondary | ICD-10-CM

## 2022-11-10 DIAGNOSIS — I5033 Acute on chronic diastolic (congestive) heart failure: Principal | ICD-10-CM

## 2022-11-10 DIAGNOSIS — I4819 Other persistent atrial fibrillation: Secondary | ICD-10-CM | POA: Diagnosis present

## 2022-11-10 DIAGNOSIS — I2721 Secondary pulmonary arterial hypertension: Secondary | ICD-10-CM | POA: Diagnosis not present

## 2022-11-10 DIAGNOSIS — K219 Gastro-esophageal reflux disease without esophagitis: Secondary | ICD-10-CM | POA: Diagnosis present

## 2022-11-10 DIAGNOSIS — Z01818 Encounter for other preprocedural examination: Secondary | ICD-10-CM | POA: Diagnosis not present

## 2022-11-10 DIAGNOSIS — I509 Heart failure, unspecified: Secondary | ICD-10-CM

## 2022-11-10 DIAGNOSIS — I959 Hypotension, unspecified: Secondary | ICD-10-CM | POA: Diagnosis not present

## 2022-11-10 DIAGNOSIS — I110 Hypertensive heart disease with heart failure: Secondary | ICD-10-CM | POA: Diagnosis present

## 2022-11-10 DIAGNOSIS — Z6836 Body mass index (BMI) 36.0-36.9, adult: Secondary | ICD-10-CM | POA: Diagnosis not present

## 2022-11-10 DIAGNOSIS — R0989 Other specified symptoms and signs involving the circulatory and respiratory systems: Secondary | ICD-10-CM | POA: Diagnosis not present

## 2022-11-10 DIAGNOSIS — I3139 Other pericardial effusion (noninflammatory): Secondary | ICD-10-CM | POA: Diagnosis present

## 2022-11-10 DIAGNOSIS — J984 Other disorders of lung: Secondary | ICD-10-CM | POA: Diagnosis not present

## 2022-11-10 DIAGNOSIS — F32A Depression, unspecified: Secondary | ICD-10-CM | POA: Diagnosis present

## 2022-11-10 DIAGNOSIS — E785 Hyperlipidemia, unspecified: Secondary | ICD-10-CM | POA: Diagnosis present

## 2022-11-10 DIAGNOSIS — J324 Chronic pansinusitis: Secondary | ICD-10-CM | POA: Diagnosis present

## 2022-11-10 DIAGNOSIS — F411 Generalized anxiety disorder: Secondary | ICD-10-CM | POA: Diagnosis not present

## 2022-11-10 DIAGNOSIS — I2609 Other pulmonary embolism with acute cor pulmonale: Secondary | ICD-10-CM | POA: Diagnosis not present

## 2022-11-10 DIAGNOSIS — E1159 Type 2 diabetes mellitus with other circulatory complications: Secondary | ICD-10-CM | POA: Diagnosis present

## 2022-11-10 DIAGNOSIS — Z8249 Family history of ischemic heart disease and other diseases of the circulatory system: Secondary | ICD-10-CM

## 2022-11-10 DIAGNOSIS — Z79899 Other long term (current) drug therapy: Secondary | ICD-10-CM

## 2022-11-10 DIAGNOSIS — I4821 Permanent atrial fibrillation: Secondary | ICD-10-CM | POA: Diagnosis present

## 2022-11-10 DIAGNOSIS — R918 Other nonspecific abnormal finding of lung field: Secondary | ICD-10-CM | POA: Diagnosis not present

## 2022-11-10 DIAGNOSIS — I517 Cardiomegaly: Secondary | ICD-10-CM | POA: Diagnosis not present

## 2022-11-10 DIAGNOSIS — I4891 Unspecified atrial fibrillation: Secondary | ICD-10-CM | POA: Diagnosis not present

## 2022-11-10 DIAGNOSIS — R0602 Shortness of breath: Principal | ICD-10-CM

## 2022-11-10 DIAGNOSIS — Z87891 Personal history of nicotine dependence: Secondary | ICD-10-CM

## 2022-11-10 DIAGNOSIS — I2510 Atherosclerotic heart disease of native coronary artery without angina pectoris: Secondary | ICD-10-CM | POA: Diagnosis present

## 2022-11-10 DIAGNOSIS — J90 Pleural effusion, not elsewhere classified: Secondary | ICD-10-CM | POA: Diagnosis not present

## 2022-11-10 DIAGNOSIS — Z885 Allergy status to narcotic agent status: Secondary | ICD-10-CM

## 2022-11-10 DIAGNOSIS — I5032 Chronic diastolic (congestive) heart failure: Secondary | ICD-10-CM | POA: Diagnosis not present

## 2022-11-10 DIAGNOSIS — Z7901 Long term (current) use of anticoagulants: Secondary | ICD-10-CM

## 2022-11-10 DIAGNOSIS — I7122 Aneurysm of the aortic arch, without rupture: Secondary | ICD-10-CM | POA: Diagnosis not present

## 2022-11-10 DIAGNOSIS — E1169 Type 2 diabetes mellitus with other specified complication: Secondary | ICD-10-CM | POA: Diagnosis not present

## 2022-11-10 DIAGNOSIS — Z7983 Long term (current) use of bisphosphonates: Secondary | ICD-10-CM

## 2022-11-10 DIAGNOSIS — Z7951 Long term (current) use of inhaled steroids: Secondary | ICD-10-CM

## 2022-11-10 DIAGNOSIS — Z713 Dietary counseling and surveillance: Secondary | ICD-10-CM

## 2022-11-10 DIAGNOSIS — R0902 Hypoxemia: Secondary | ICD-10-CM

## 2022-11-10 HISTORY — DX: Acute and chronic respiratory failure with hypoxia: J96.22

## 2022-11-10 LAB — I-STAT VENOUS BLOOD GAS, ED
Acid-Base Excess: 7 mmol/L — ABNORMAL HIGH (ref 0.0–2.0)
Bicarbonate: 35.9 mmol/L — ABNORMAL HIGH (ref 20.0–28.0)
Calcium, Ion: 1.25 mmol/L (ref 1.15–1.40)
HCT: 35 % — ABNORMAL LOW (ref 36.0–46.0)
Hemoglobin: 11.9 g/dL — ABNORMAL LOW (ref 12.0–15.0)
O2 Saturation: 75 %
Patient temperature: 98.6
Potassium: 4.4 mmol/L (ref 3.5–5.1)
Sodium: 139 mmol/L (ref 135–145)
TCO2: 38 mmol/L — ABNORMAL HIGH (ref 22–32)
pCO2, Ven: 71.6 mm[Hg] (ref 44–60)
pH, Ven: 7.309 (ref 7.25–7.43)
pO2, Ven: 46 mm[Hg] — ABNORMAL HIGH (ref 32–45)

## 2022-11-10 LAB — CBC WITH DIFFERENTIAL/PLATELET
Abs Immature Granulocytes: 0.03 10*3/uL (ref 0.00–0.07)
Basophils Absolute: 0 10*3/uL (ref 0.0–0.1)
Basophils Relative: 0 %
Eosinophils Absolute: 0.1 10*3/uL (ref 0.0–0.5)
Eosinophils Relative: 1 %
HCT: 36.2 % (ref 36.0–46.0)
Hemoglobin: 11.4 g/dL — ABNORMAL LOW (ref 12.0–15.0)
Immature Granulocytes: 0 %
Lymphocytes Relative: 5 %
Lymphs Abs: 0.4 10*3/uL — ABNORMAL LOW (ref 0.7–4.0)
MCH: 29.2 pg (ref 26.0–34.0)
MCHC: 31.5 g/dL (ref 30.0–36.0)
MCV: 92.6 fL (ref 80.0–100.0)
Monocytes Absolute: 0.5 10*3/uL (ref 0.1–1.0)
Monocytes Relative: 5 %
Neutro Abs: 8.4 10*3/uL — ABNORMAL HIGH (ref 1.7–7.7)
Neutrophils Relative %: 89 %
Platelets: 247 10*3/uL (ref 150–400)
RBC: 3.91 MIL/uL (ref 3.87–5.11)
RDW: 15.9 % — ABNORMAL HIGH (ref 11.5–15.5)
WBC: 9.4 10*3/uL (ref 4.0–10.5)
nRBC: 0 % (ref 0.0–0.2)

## 2022-11-10 LAB — TROPONIN I (HIGH SENSITIVITY): Troponin I (High Sensitivity): 7 ng/L (ref <18)

## 2022-11-10 LAB — SARS CORONAVIRUS 2 BY RT PCR: SARS Coronavirus 2 by RT PCR: NEGATIVE

## 2022-11-10 LAB — BASIC METABOLIC PANEL
Anion gap: 5 (ref 5–15)
BUN: 11 mg/dL (ref 8–23)
CO2: 30 mmol/L (ref 22–32)
Calcium: 8.9 mg/dL (ref 8.9–10.3)
Chloride: 105 mmol/L (ref 98–111)
Creatinine, Ser: 0.8 mg/dL (ref 0.44–1.00)
GFR, Estimated: >60 mL/min (ref >60)
Glucose, Bld: 152 mg/dL — ABNORMAL HIGH (ref 70–99)
Potassium: 5 mmol/L (ref 3.5–5.1)
Sodium: 140 mmol/L (ref 135–145)

## 2022-11-10 LAB — BRAIN NATRIURETIC PEPTIDE: B Natriuretic Peptide: 360.2 pg/mL — ABNORMAL HIGH (ref 0.0–100.0)

## 2022-11-10 MED ORDER — IPRATROPIUM BROMIDE 0.02 % IN SOLN
0.5000 mg | Freq: Once | RESPIRATORY_TRACT | Status: DC
  Filled 2022-11-10: qty 2.5

## 2022-11-10 MED ORDER — FUROSEMIDE 10 MG/ML IJ SOLN
40.0000 mg | Freq: Two times a day (BID) | INTRAMUSCULAR | Status: DC
  Administered 2022-11-10: 40 mg via INTRAVENOUS
  Filled 2022-11-10: qty 4

## 2022-11-10 MED ORDER — FUROSEMIDE 10 MG/ML IJ SOLN
60.0000 mg | Freq: Once | INTRAMUSCULAR | Status: AC
  Administered 2022-11-10: 60 mg via INTRAVENOUS
  Filled 2022-11-10: qty 6

## 2022-11-10 MED ORDER — MAGNESIUM SULFATE 2 GM/50ML IV SOLN
2.0000 g | Freq: Once | INTRAVENOUS | Status: AC
  Administered 2022-11-10: 2 g via INTRAVENOUS
  Filled 2022-11-10: qty 50

## 2022-11-10 MED ORDER — ACETAMINOPHEN 650 MG RE SUPP: 650.0000 mg | Freq: Four times a day (QID) | RECTAL | Status: DC | PRN

## 2022-11-10 MED ORDER — IPRATROPIUM-ALBUTEROL 0.5-2.5 (3) MG/3ML IN SOLN
3.0000 mL | Freq: Once | RESPIRATORY_TRACT | Status: AC
  Administered 2022-11-10: 3 mL via RESPIRATORY_TRACT

## 2022-11-10 MED ORDER — APIXABAN 5 MG PO TABS
5.0000 mg | ORAL_TABLET | Freq: Two times a day (BID) | ORAL | Status: DC
  Administered 2022-11-10 – 2022-11-16 (×12): 5 mg via ORAL
  Filled 2022-11-10 (×12): qty 1

## 2022-11-10 MED ORDER — GUAIFENESIN ER 600 MG PO TB12
600.0000 mg | ORAL_TABLET | Freq: Two times a day (BID) | ORAL | Status: DC
  Administered 2022-11-10 – 2022-11-11 (×2): 600 mg via ORAL
  Filled 2022-11-10 (×2): qty 1

## 2022-11-10 MED ORDER — METOPROLOL TARTRATE 25 MG PO TABS
25.0000 mg | ORAL_TABLET | Freq: Two times a day (BID) | ORAL | Status: DC
  Administered 2022-11-11 (×2): 25 mg via ORAL
  Filled 2022-11-10 (×3): qty 1

## 2022-11-10 MED ORDER — METHYLPREDNISOLONE SODIUM SUCC 125 MG IJ SOLR
125.0000 mg | Freq: Once | INTRAMUSCULAR | Status: AC
  Administered 2022-11-10: 125 mg via INTRAVENOUS
  Filled 2022-11-10: qty 2

## 2022-11-10 MED ORDER — LORATADINE 10 MG PO TABS
10.0000 mg | ORAL_TABLET | Freq: Every day | ORAL | Status: DC
  Administered 2022-11-10 – 2022-11-15 (×6): 10 mg via ORAL
  Filled 2022-11-10 (×6): qty 1

## 2022-11-10 MED ORDER — PANTOPRAZOLE SODIUM 20 MG PO TBEC
20.0000 mg | DELAYED_RELEASE_TABLET | Freq: Every day | ORAL | Status: DC
  Administered 2022-11-10 – 2022-11-15 (×6): 20 mg via ORAL
  Filled 2022-11-10 (×7): qty 1

## 2022-11-10 MED ORDER — ALBUTEROL (5 MG/ML) CONTINUOUS INHALATION SOLN
10.0000 mg/h | INHALATION_SOLUTION | RESPIRATORY_TRACT | Status: DC
  Filled 2022-11-10: qty 20

## 2022-11-10 MED ORDER — MIRTAZAPINE 15 MG PO TABS
45.0000 mg | ORAL_TABLET | Freq: Every day | ORAL | Status: DC
  Administered 2022-11-10 – 2022-11-15 (×6): 45 mg via ORAL
  Filled 2022-11-10 (×6): qty 3

## 2022-11-10 MED ORDER — FUROSEMIDE 10 MG/ML IJ SOLN
40.0000 mg | Freq: Every day | INTRAMUSCULAR | Status: DC
  Administered 2022-11-11: 40 mg via INTRAVENOUS
  Filled 2022-11-10: qty 4

## 2022-11-10 MED ORDER — ONDANSETRON HCL 4 MG PO TABS: 4.0000 mg | ORAL_TABLET | Freq: Four times a day (QID) | ORAL | Status: DC | PRN

## 2022-11-10 MED ORDER — ONDANSETRON HCL 4 MG/2ML IJ SOLN: 4.0000 mg | Freq: Four times a day (QID) | INTRAMUSCULAR | Status: DC | PRN

## 2022-11-10 MED ORDER — ALBUTEROL SULFATE (2.5 MG/3ML) 0.083% IN NEBU
2.5000 mg | INHALATION_SOLUTION | Freq: Four times a day (QID) | RESPIRATORY_TRACT | Status: DC | PRN
  Administered 2022-11-12: 2.5 mg via RESPIRATORY_TRACT
  Filled 2022-11-10: qty 3

## 2022-11-10 MED ORDER — ATORVASTATIN CALCIUM 40 MG PO TABS
40.0000 mg | ORAL_TABLET | Freq: Every day | ORAL | Status: DC
  Administered 2022-11-10 – 2022-11-16 (×7): 40 mg via ORAL
  Filled 2022-11-10 (×7): qty 1

## 2022-11-10 MED ORDER — INSULIN ASPART 100 UNIT/ML IJ SOLN
0.0000 [IU] | Freq: Three times a day (TID) | INTRAMUSCULAR | Status: DC
  Administered 2022-11-11 (×2): 3 [IU] via SUBCUTANEOUS
  Administered 2022-11-12: 4 [IU] via SUBCUTANEOUS
  Administered 2022-11-13: 3 [IU] via SUBCUTANEOUS
  Administered 2022-11-14: 7 [IU] via SUBCUTANEOUS
  Administered 2022-11-15: 3 [IU] via SUBCUTANEOUS

## 2022-11-10 MED ORDER — IPRATROPIUM-ALBUTEROL 0.5-2.5 (3) MG/3ML IN SOLN
3.0000 mL | Freq: Four times a day (QID) | RESPIRATORY_TRACT | Status: DC
  Administered 2022-11-10 – 2022-11-13 (×10): 3 mL via RESPIRATORY_TRACT
  Filled 2022-11-10 (×10): qty 3

## 2022-11-10 MED ORDER — PREDNISONE 20 MG PO TABS
40.0000 mg | ORAL_TABLET | Freq: Every day | ORAL | Status: AC
  Administered 2022-11-11 – 2022-11-15 (×5): 40 mg via ORAL
  Filled 2022-11-10 (×5): qty 2

## 2022-11-10 MED ORDER — SERTRALINE HCL 100 MG PO TABS
100.0000 mg | ORAL_TABLET | Freq: Every day | ORAL | Status: DC
  Administered 2022-11-10 – 2022-11-15 (×6): 100 mg via ORAL
  Filled 2022-11-10 (×6): qty 1

## 2022-11-10 MED ORDER — LISINOPRIL 5 MG PO TABS
5.0000 mg | ORAL_TABLET | Freq: Every day | ORAL | Status: DC
  Administered 2022-11-11: 5 mg via ORAL
  Filled 2022-11-10 (×2): qty 1

## 2022-11-10 MED ORDER — ACETAMINOPHEN 325 MG PO TABS
650.0000 mg | ORAL_TABLET | Freq: Four times a day (QID) | ORAL | Status: DC | PRN
  Administered 2022-11-10 – 2022-11-12 (×2): 650 mg via ORAL
  Filled 2022-11-10 (×2): qty 2

## 2022-11-10 MED ORDER — LORAZEPAM 1 MG PO TABS
1.0000 mg | ORAL_TABLET | Freq: Two times a day (BID) | ORAL | Status: DC | PRN
  Administered 2022-11-10 – 2022-11-14 (×3): 1 mg via ORAL
  Filled 2022-11-10 (×3): qty 1

## 2022-11-10 NOTE — Progress Notes (Signed)
Pt states she does not use any machine bipap at home. Refused for tonight. Pt is O2 dependent and use neb tx at home.No resp distress noted at this time.

## 2022-11-10 NOTE — Progress Notes (Signed)
RT NOTE:  Pt arrived SOB on 3L [place]. In triage O2 saturations 76% on the 3L, pt taken to room and placed on 6L [place], saturations came up to 85%. RT then placed pt on salter high flow nasal cannula at 10L and pt current saturations are at 97-99%, will wean this down as pt tolerates. Pt has slight exp. Wheezing in right upper lobe, all other lung fields diminished. Verbal order given from Trifan, MD to give two back to back duonebs. Duoneb breathing treatments currently in progress.

## 2022-11-10 NOTE — Progress Notes (Signed)
RT NOTE:  VBG resulted and reported to EDP.

## 2022-11-10 NOTE — H&P (Signed)
History and Physical    Patient: Lindsay [name] ZYS:[id number] DOB: 04/03/1940 DOA: 11/10/2022 DOS: the patient was seen and examined on 11/10/2022 PCP: Tollie Eth, NP  Patient coming from: Home  Chief Complaint: Shortness of breath.  HPI: Lindsay [name] is a 82 y.o. female with medical history significant of diastolic CHF, pansinusitis, chronic respiratory failure on home oxygen, asthma, permanent atrial fibrillation, dysphagia, dyspnea, elevated troponin, hyperlipidemia, hypertension, hypokalemia, hyponatremia LGSIL, moderate persistent asthma has been having progressively worse dyspnea associated with wheezing, nonproductive cough and fatigue since Monday.  She stated that she was served a meal at the facility that felt salty to taste on Sunday or Monday.  No fever or chills.  No sore throat.  No travel history or sick contacts.  No fever, chills or night sweats. No sore throat, rhinorrhea, dyspnea, wheezing or hemoptysis.  No chest pain, palpitations, diaphoresis, PND, or orthopnea.  No abdominal pain, diarrhea, constipation, melena or hematochezia.  No flank pain, dysuria, frequency or hematuria.  No polyuria, polydipsia, polyphagia or blurred vision.  Lab work: CBC showed a white count of troponin was normal, 9.4, hemoglobin 11.4 g/dL and platelets 932. BNP 360.2 pg/mL.  BMP showed a glucose of 152 mg/dL, but was otherwise normal.  Coronavirus PCR negative.  Imaging: Portable 1 view chest radiograph showing stable cardiomegaly with bilateral pulmonary edema and pleural effusions.   ED course: Initial vital signs were temperature 97.6 F, pulse 79, respiration 22, BP 92/61 mmHg O2 sat 86% on room air.  The patient received furosemide 60 mg IVP, 2 DuoNebs and methylprednisolone 125 mg IVPB.  Review of Systems: As mentioned in the history of present illness. All other systems reviewed and are negative. Past Medical History:  Diagnosis Date   Acute diastolic CHF (congestive heart failure) (HCC)  08/03/2020   Acute non-recurrent pansinusitis 10/21/2020   Acute on chronic respiratory failure with hypoxia (HCC) 08/02/2020   Asthma    Asthma exacerbation 08/02/2020   Atrial fibrillation (HCC)    Dysphagia 08/02/2020   Dyspnea 05/09/2021   Elevated troponin 09/14/2020   High cholesterol    HTN (hypertension)    Hyperkalemia 09/14/2020   Hyponatremia 08/02/2020   Low grade squamous intraepithelial lesion on cytologic smear of cervix (LGSIL) 12/02/2019   Moderate persistent asthma without complication 12/02/2019   Respiratory failure with hypoxia (HCC)    Sore throat 11/16/2020   Past Surgical History:  Procedure Laterality Date   CARDIOVERSION N/A 09/13/2020   Procedure: CARDIOVERSION;  Surgeon: Parke Poisson, MD;  Location: [facility] ENDOSCOPY;  Service: Cardiovascular;  Laterality: N/A;   ESOPHAGOGASTRODUODENOSCOPY N/A 08/05/2020   Procedure: ESOPHAGOGASTRODUODENOSCOPY (EGD);  Surgeon: Kathi Der, MD;  Location: [facility] ENDOSCOPY;  Service: Gastroenterology;  Laterality: N/A;   Social History:  reports that she has quit smoking. Her smoking use included cigarettes. She has never used smokeless tobacco. She reports that she does not currently use alcohol. She reports that she does not use drugs.  Allergies  Allergen Reactions   Acetaminophen-Codeine Other (See Comments)    Agitation   Naproxen-Esomeprazole Mg Diarrhea   Codeine Other (See Comments)    hallucinations    Family History  Problem Relation Age of Onset   Breast cancer Sister    Hypertension Other     Prior to Admission medications   Medication Sig Start Date End Date Taking? Authorizing Provider  albuterol (PROVENTIL) (5 MG/ML) 0.5% nebulizer solution Take 0.5 mLs by nebulization every 6 (six) hours as needed for wheezing or shortness of breath.  [provider]  alendronate (FOSAMAX) 70 MG tablet Take 1 tablet (70 mg total) by mouth once a week. Take with a full glass of water on an empty  stomach. 08/21/22   Early, Sung Amabile, NP  apixaban (ELIQUIS) 5 MG TABS tablet Take 1 tablet (5 mg total) by mouth 2 (two) times daily. 06/30/22   Alver Sorrow, NP  atorvastatin (LIPITOR) 40 MG tablet Take 1 tablet (40 mg total) by mouth daily. 08/21/22 11/19/22  Alver Sorrow, NP  azithromycin (ZITHROMAX) 250 MG tablet Take as directed 09/22/22   Martina Sinner, MD  Calcium-Magnesium-Vitamin D (CALCIUM 1200+D3 PO) Take 1 tablet by mouth in the morning.    [provider]  cetirizine (ZYRTEC) 10 MG tablet TAKE 1 TABLET BY MOUTH EVERY DAY 02/06/22   Martina Sinner, MD  doxycycline (VIBRA-TABS) 100 MG tablet Take 1 tablet (100 mg total) by mouth 2 (two) times daily. 10/17/22   Martina Sinner, MD  Elastic Bandages & Supports (MEDICAL COMPRESSION STOCKINGS) MISC Two pair knee high 30-65mmHg compression stockings. Sized XL. As covered by insurance for ICD-10: I50.33, R60.0. 03/09/21   Early, Sung Amabile, NP  Fluticasone-Umeclidin-Vilant (TRELEGY ELLIPTA) 100-62.5-25 MCG/ACT AEPB Inhale 1 puff into the lungs daily. 10/18/22   Martina Sinner, MD  guaiFENesin (MUCINEX) 600 MG 12 hr tablet TAKE 1 TABLET BY MOUTH 2 TIMES DAILY 03/22/22   Cobb, Ruby Cola, NP  ipratropium-albuterol (DUONEB) 0.5-2.5 (3) MG/3ML SOLN Inhale 3 mLs into the lungs every 6 (six) hours as needed (wheezing). 05/09/21   Martina Sinner, MD  LORazepam (ATIVAN) 1 MG tablet Take 1 tablet (1 mg total) by mouth 2 (two) times daily as needed for anxiety. 08/23/22   Tollie Eth, NP  Menthol (COUGH DROPS) 5.8 MG LOZG Use as directed 1 lozenge (5.8 mg total) in the mouth or throat as needed (for dry, sore, or scratchy throat). 11/16/20   Tollie Eth, NP  mirtazapine (REMERON) 45 MG tablet Take 1 tablet (45 mg total) by mouth at bedtime. 08/23/22   Tollie Eth, NP  Multiple Vitamin (MULTIVITAMIN WITH MINERALS) TABS tablet Take 1 tablet by mouth in the morning.    [provider]  OXYGEN Inhale 2 L into the lungs daily  as needed (shortness of breath or activity level).    [provider]  pantoprazole (PROTONIX) 20 MG tablet Take 1 tablet (20 mg total) by mouth daily. 08/22/22   Tollie Eth, NP  potassium chloride 20 MEQ TBCR Take 20 mEq by mouth daily. 01/02/22 11/10/22  Alver Sorrow, NP  predniSONE (DELTASONE) 10 MG tablet Take 4 tablets (40 mg total) by mouth daily with breakfast. Patient not taking: Reported on 11/06/2022 09/22/22   Martina Sinner, MD  predniSONE (DELTASONE) 20 MG tablet Take 2 tablets (40 mg total) by mouth daily with breakfast. 11/09/22   Charlott Holler, MD  sertraline (ZOLOFT) 100 MG tablet Take 1 tablet (100 mg total) by mouth at bedtime. 08/23/22   Tollie Eth, NP  Spacer/Aero-Holding Chambers (AEROCHAMBER MV) inhaler Use as instructed 09/29/22   Martina Sinner, MD  sucralfate (CARAFATE) 1 g tablet Take 1 tablet (1 g total) by mouth 4 (four) times daily -  with meals and at bedtime for 14 days. 05/21/22 11/10/22  Curatolo, Adam, DO  torsemide (DEMADEX) 20 MG tablet TAKE 2 TABLETS DAILY EVERY MORNING. TAKE AN ADDITIONAL 2 TABLETS AS NEEDED FOR WEIGHT GAIN OF 2 POUNDS OVERNIGHT OR  5 POUNDS IN ONE WEEK. 08/21/22   Tollie Eth, NP    Physical Exam: Vitals:   11/10/22 1029 11/10/22 1030 11/10/22 1218 11/10/22 1534  BP:  (!) 101/41  105/77  Pulse: 82 77  81  Resp: (!) 27 (!) 27  18  Temp:   98.1 F (36.7 C) 98.2 F (36.8 C)  TempSrc:   Oral Oral  SpO2: 97% 96%  94%  Weight:      Height:       Physical Exam Vitals and nursing note reviewed.  Constitutional:      General: She is awake. She is not in acute distress.    Appearance: Normal appearance. She is obese. She is ill-appearing.     Interventions: Nasal cannula in place.  HENT:     Head: Normocephalic.     Nose: No rhinorrhea.     Mouth/Throat:     Mouth: Mucous membranes are moist.  Eyes:     General: No scleral icterus.    Pupils: Pupils are equal, round, and reactive to light.  Neck:      Vascular: JVD present.  Cardiovascular:     Rate and Rhythm: Normal rate and regular rhythm.     Heart sounds: S1 normal and S2 normal.  Pulmonary:     Breath sounds: Wheezing and rales present. No rhonchi.  Abdominal:     General: Bowel sounds are normal. There is no distension.     Palpations: Abdomen is soft.     Tenderness: There is no abdominal tenderness. There is no right CVA tenderness or left CVA tenderness.  Musculoskeletal:     Cervical back: Neck supple.     Right lower leg: 1+ Pitting Edema present.     Left lower leg: 1+ Pitting Edema present.  Skin:    General: Skin is warm and dry.  Neurological:     General: No focal deficit present.     Mental Status: She is alert and oriented to person, place, and time.  Psychiatric:        Behavior: Behavior is cooperative.   Data Reviewed:  Results are pending, will review when available.  April, 2023 echocardiogram IMPRESSIONS:   1. Left ventricular ejection fraction, by estimation, is 60 to 65%. The  left ventricle has normal function. The left ventricle has no regional  wall motion abnormalities. Left ventricular diastolic function could not  be evaluated.   2. Right ventricular systolic function is normal. The right ventricular  size is normal. There is normal pulmonary artery systolic pressure. The  estimated right ventricular systolic pressure is 31.8 mmHg.   3. Left atrial size was mildly dilated.   4. The mitral valve is grossly normal. Trivial mitral valve  regurgitation. No evidence of mitral stenosis.   5. The aortic valve was not well visualized. Aortic valve regurgitation  is not visualized. Aortic valve sclerosis is present, with no evidence of  aortic valve stenosis.   6. The inferior vena cava is dilated in size with >50% respiratory  variability, suggesting right atrial pressure of 8 mmHg.   EKG: Vent. rate 86 BPM PR interval * ms QRS duration 88 ms QT/QTcB 370/443 ms P-R-T axes * -6 13 Atrial  fibrillation Ventricular premature complex Low voltage, precordial leads Abnormal R-wave progression, early transition Borderline T abnormalities, anterior leads  Assessment and Plan: Principal Problem:   Acute on chronic respiratory failure with hypoxia and hypercapnia (HCC) In the setting of:   Acute on chronic diastolic CHF (  congestive heart failure) (HCC) Superimposed on:   Chronic obstructive pulmonary disease (HCC) Observation/telemetry. Supplemental oxygen as needed. Scheduled and as needed bronchodilators. Sodium and fluid restriction. Continue furosemide 40 mg IVP daily. Begin lisinopril 5 mg p.o. daily. Monitor daily weights, intake and output. Monitor renal function/electrolytes.  Active Problems:   Hypertension associated with diabetes (HCC) Holding metoprolol. Started low-dose lisinopril for tomorrow morning. Continue furosemide 40 mg IVP daily.    Hyperlipidemia associated with type 2 diabetes mellitus (HCC) Continue atorvastatin 40 mg p.o. daily.    GERD (gastroesophageal reflux disease) Continue home PPI.    Persistent atrial fibrillation (HCC) Continue apixaban 5 mg p.o. twice daily. Resume metoprolol in the morning.    GAD (generalized anxiety disorder) Continue lorazepam 1 mg p.o. twice daily as needed.    Hypothyroidism Check TSH level. Not on levothyroxine.    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 Will obtain while in the hospital.    Advance Care Planning:   Code Status: Full Code   Consults:   Family Communication:   Severity of Illness: The appropriate patient status for this patient is INPATIENT. Inpatient status is judged to be reasonable and necessary in order to provide the required intensity of service to ensure the patient's safety. The patient's presenting symptoms, physical exam findings, and initial radiographic and laboratory data in the context of their chronic comorbidities is felt to place them at  high risk for further clinical deterioration. Furthermore, it is not anticipated that the patient will be medically stable for discharge from the hospital within 2 midnights of admission.   * I certify that at the point of admission it is my clinical judgment that the patient will require inpatient hospital care spanning beyond 2 midnights from the point of admission due to high intensity of service, high risk for further deterioration and high frequency of surveillance required.*  Author: Bobette Mo, MD 11/10/2022 4:04 PM  For on call review [email or web address].   This document was prepared using Dragon voice recognition software and may contain some unintended transcription errors.

## 2022-11-10 NOTE — Plan of Care (Signed)
[facility] Tonsillectomy/Adenoidectomy, Postoperative PEDS care plan entered in error.

## 2022-11-10 NOTE — ED Provider Notes (Signed)
[place] EMERGENCY DEPARTMENT AT [facility] Provider Note   CSN: [id number] Arrival date & time: 11/10/22  3086     History  No chief complaint on file.   Lindsay [name] is a 82 y.o. female with a history of COPD on 2 to 3 L at baseline, history of chronic hypoxic respiratory failure, history congestive heart failure, hypertension, hyperlipidemia, coronary artery disease, paroxysmal A-fib on Eliquis, presented to ED with complaint of shortness of breath.  Patient lives in assisted living facility, reports that for the past 4 to 5 days she has a progressive decline in her function and ability to breathe.  She normally walks with a walker.  She said yesterday she felt so dry and she could not get up or walk around.  She denies chest pain, but reports that she just feels extremely fatigued.  There was concern that she was hypoxic today and she was brought into the ED.  She takes torsemide 40 mg daily for chronic edema of the lower extremities.  Last echo 05/10/21: 1. Left ventricular ejection fraction, by estimation, is 60 to 65%. The  left ventricle has normal function. The left ventricle has no regional  wall motion abnormalities. Left ventricular diastolic function could not  be evaluated.   2. Right ventricular systolic function is normal. The right ventricular  size is normal. There is normal pulmonary artery systolic pressure. The  estimated right ventricular systolic pressure is 31.8 mmHg.   3. Left atrial size was mildly dilated.   4. The mitral valve is grossly normal. Trivial mitral valve  regurgitation. No evidence of mitral stenosis.   5. The aortic valve was not well visualized. Aortic valve regurgitation  is not visualized. Aortic valve sclerosis is present, with no evidence of  aortic valve stenosis.   6. The inferior vena cava is dilated in size with >50% respiratory  variability, suggesting right atrial pressure of 8 mmHg.   HPI     Home Medications Prior to  Admission medications   Medication Sig Start Date End Date Taking? Authorizing Provider  albuterol (PROVENTIL) (5 MG/ML) 0.5% nebulizer solution Take 0.5 mLs by nebulization every 6 (six) hours as needed for wheezing or shortness of breath. Patient not taking: Reported on 11/06/2022    [provider]  alendronate (FOSAMAX) 70 MG tablet Take 1 tablet (70 mg total) by mouth once a week. Take with a full glass of water on an empty stomach. 08/21/22   Early, Sung Amabile, NP  apixaban (ELIQUIS) 5 MG TABS tablet Take 1 tablet (5 mg total) by mouth 2 (two) times daily. 06/30/22   Alver Sorrow, NP  atorvastatin (LIPITOR) 40 MG tablet Take 1 tablet (40 mg total) by mouth daily. 08/21/22 11/19/22  Alver Sorrow, NP  azithromycin (ZITHROMAX) 250 MG tablet Take as directed Patient not taking: Reported on 11/06/2022 09/22/22   Martina Sinner, MD  Calcium-Magnesium-Vitamin D (CALCIUM 1200+D3 PO) Take 1 tablet by mouth in the morning.    [provider]  cetirizine (ZYRTEC) 10 MG tablet TAKE 1 TABLET BY MOUTH EVERY DAY 02/06/22   Martina Sinner, MD  doxycycline (VIBRA-TABS) 100 MG tablet Take 1 tablet (100 mg total) by mouth 2 (two) times daily. Patient not taking: Reported on 11/06/2022 10/17/22   Martina Sinner, MD  Elastic Bandages & Supports (MEDICAL COMPRESSION STOCKINGS) MISC Two pair knee high 30-68mmHg compression stockings. Sized XL. As covered by insurance for ICD-10: I50.33, R60.0. 03/09/21   Early, Sung Amabile,  NP  Fluticasone-Umeclidin-Vilant (TRELEGY ELLIPTA) 100-62.5-25 MCG/ACT AEPB Inhale 1 puff into the lungs daily. 10/18/22   Martina Sinner, MD  guaiFENesin (MUCINEX) 600 MG 12 hr tablet TAKE 1 TABLET BY MOUTH 2 TIMES DAILY 03/22/22   Cobb, Ruby Cola, NP  ipratropium-albuterol (DUONEB) 0.5-2.5 (3) MG/3ML SOLN Inhale 3 mLs into the lungs every 6 (six) hours as needed (wheezing). 05/09/21   Martina Sinner, MD  LORazepam (ATIVAN) 1 MG tablet Take 1 tablet (1 mg total) by  mouth 2 (two) times daily as needed for anxiety. 08/23/22   Tollie Eth, NP  Menthol (COUGH DROPS) 5.8 MG LOZG Use as directed 1 lozenge (5.8 mg total) in the mouth or throat as needed (for dry, sore, or scratchy throat). 11/16/20   Tollie Eth, NP  mirtazapine (REMERON) 45 MG tablet Take 1 tablet (45 mg total) by mouth at bedtime. 08/23/22   Tollie Eth, NP  Multiple Vitamin (MULTIVITAMIN WITH MINERALS) TABS tablet Take 1 tablet by mouth in the morning.    [provider]  OXYGEN Inhale 2 L into the lungs daily as needed (shortness of breath or activity level).    [provider]  pantoprazole (PROTONIX) 20 MG tablet Take 1 tablet (20 mg total) by mouth daily. 08/22/22   Tollie Eth, NP  potassium chloride 20 MEQ TBCR Take 20 mEq by mouth daily. 01/02/22 02/01/22  Alver Sorrow, NP  predniSONE (DELTASONE) 10 MG tablet Take 4 tablets (40 mg total) by mouth daily with breakfast. Patient not taking: Reported on 11/06/2022 09/22/22   Martina Sinner, MD  predniSONE (DELTASONE) 20 MG tablet Take 2 tablets (40 mg total) by mouth daily with breakfast. 11/09/22   Charlott Holler, MD  sertraline (ZOLOFT) 100 MG tablet Take 1 tablet (100 mg total) by mouth at bedtime. 08/23/22   Tollie Eth, NP  Spacer/Aero-Holding Chambers (AEROCHAMBER MV) inhaler Use as instructed 09/29/22   Martina Sinner, MD  sucralfate (CARAFATE) 1 g tablet Take 1 tablet (1 g total) by mouth 4 (four) times daily -  with meals and at bedtime for 14 days. 05/21/22 06/04/22  Curatolo, Adam, DO  torsemide (DEMADEX) 20 MG tablet TAKE 2 TABLETS DAILY EVERY MORNING. TAKE AN ADDITIONAL 2 TABLETS AS NEEDED FOR WEIGHT GAIN OF 2 POUNDS OVERNIGHT OR 5 POUNDS IN ONE WEEK. 08/21/22   Early, Sung Amabile, NP      Allergies    Acetaminophen-codeine, Naproxen-esomeprazole mg, and Codeine    Review of Systems   Review of Systems  Physical Exam Updated Vital Signs BP 94/71   Pulse 97   Temp 97.6 F (36.4 C) (Oral)   Resp  (!) 26   Ht 5\' 8"  (1.727 m)   Wt 110.2 kg   SpO2 95%   BMI 36.95 kg/m  Physical Exam Constitutional:      General: She is not in acute distress.    Appearance: She is obese.  HENT:     Head: Normocephalic and atraumatic.  Eyes:     Conjunctiva/sclera: Conjunctivae normal.     Pupils: Pupils are equal, round, and reactive to light.  Cardiovascular:     Rate and Rhythm: Normal rate. Rhythm irregular.  Pulmonary:     Effort: Pulmonary effort is normal. No respiratory distress.     Comments: 70% on room air, 95% on 10 L HFNC Rhonchi and wheezing bilaterally Abdominal:     General: There is no distension.     Tenderness: There  is no abdominal tenderness.  Musculoskeletal:     Right lower leg: Edema present.     Left lower leg: Edema present.  Skin:    General: Skin is warm and dry.  Neurological:     General: No focal deficit present.     Mental Status: She is alert. Mental status is at baseline.  Psychiatric:        Mood and Affect: Mood normal.        Behavior: Behavior normal.     ED Results / Procedures / Treatments   Labs (all labs ordered are listed, but only abnormal results are displayed) Labs Reviewed  BASIC METABOLIC PANEL - Abnormal; Notable for the following components:      Result Value   Glucose, Bld 152 (*)    All other components within normal limits  CBC WITH DIFFERENTIAL/PLATELET - Abnormal; Notable for the following components:   Hemoglobin 11.4 (*)    RDW 15.9 (*)    Neutro Abs 8.4 (*)    Lymphs Abs 0.4 (*)    All other components within normal limits  BRAIN NATRIURETIC PEPTIDE - Abnormal; Notable for the following components:   B Natriuretic Peptide 360.2 (*)    All other components within normal limits  I-STAT VENOUS BLOOD GAS, ED - Abnormal; Notable for the following components:   pCO2, Ven 71.6 (*)    pO2, Ven 46 (*)    Bicarbonate 35.9 (*)    TCO2 38 (*)    Acid-Base Excess 7.0 (*)    HCT 35.0 (*)    Hemoglobin 11.9 (*)    All other  components within normal limits  SARS CORONAVIRUS 2 BY RT PCR  TROPONIN I (HIGH SENSITIVITY)    EKG EKG Interpretation Date/Time:  Friday November 10 2022 08:16:32 EDT Ventricular Rate:  86 PR Interval:    QRS Duration:  88 QT Interval:  370 QTC Calculation: 443 R Axis:   -6  Text Interpretation: Atrial fibrillation Ventricular premature complex Low voltage, precordial leads Abnormal R-wave progression, early transition Borderline T abnormalities, anterior leads Confirmed by Alvester Chou [phone]) on 11/10/2022 10:11:17 AM  Radiology DG Chest Portable 1 View  Result Date: 11/10/2022 CLINICAL DATA:  Shortness of breath. EXAM: PORTABLE CHEST 1 VIEW COMPARISON:  May 21, 2022. FINDINGS: Stable cardiomegaly with central pulmonary vascular congestion. Bilateral pulmonary edema is noted with associated pleural effusions. Bony thorax is unremarkable. IMPRESSION: Stable cardiomegaly with bilateral pulmonary edema and pleural effusions. Electronically Signed   By: Lupita Raider M.D.   On: 11/10/2022 10:14    Procedures .Critical Care  Performed by: Terald Sleeper, MD Authorized by: Terald Sleeper, MD   Critical care provider statement:    Critical care time (minutes):  30   Critical care time was exclusive of:  Separately billable procedures and treating other patients   Critical care was necessary to treat or prevent imminent or life-threatening deterioration of the following conditions:  Respiratory failure   Critical care was time spent personally by me on the following activities:  Ordering and performing treatments and interventions, ordering and review of laboratory studies, ordering and review of radiographic studies, pulse oximetry, review of old charts, examination of patient and evaluation of patient's response to treatment     Medications Ordered in ED Medications  ipratropium-albuterol (DUONEB) 0.5-2.5 (3) MG/3ML nebulizer solution 3 mL (3 mLs Nebulization Given  11/10/22 0831)  ipratropium-albuterol (DUONEB) 0.5-2.5 (3) MG/3ML nebulizer solution 3 mL (3 mLs Nebulization Given 11/10/22 0831)  furosemide (LASIX) injection 60 mg (60 mg Intravenous Given 11/10/22 1010)  methylPREDNISolone sodium succinate (SOLU-MEDROL) 125 mg/2 mL injection 125 mg (125 mg Intravenous Given 11/10/22 1007)    ED Course/ Medical Decision Making/ A&P Clinical Course as of 11/10/22 1028  Fri Nov 10, 2022  6295 Now weaned back to baseline 2L [place] [MT]  1027 Now up to 5L [place], RR 30's, will admit to hospital [MT]    Clinical Course User Index [MT] Davey Limas, Kermit Balo, MD                                 Medical Decision Making Amount and/or Complexity of Data Reviewed Labs: ordered. Radiology: ordered.  Risk Prescription drug management. Decision regarding hospitalization.   This patient presents to the ED with concern for shortness of breath. This involves an extensive number of treatment options, and is a complaint that carries with it a high risk of complications and morbidity.  The differential diagnosis includes COPD vs CHF vs pulmonary edema vs pleural effusion vs pneumonia vs other  Co-morbidities that complicate the patient evaluation: hx of COPD, CHF at risk of exacerbation  Additional history obtained from family member at bedside  External records from outside source obtained and reviewed including cardiology note, most recent echocardiogram report  I ordered and personally interpreted labs.  The pertinent results include:  trop 7, bnp 360, VBG with no acidosis, suspect chronic pco2 retention at 71.6 with bicarb 35.9, CBC unremarkable, covid negative  I ordered imaging studies including dg chest I independently visualized and interpreted imaging which showed pulmonary edema, small pleural effusion I agree with the radiologist interpretation  The patient was maintained on a cardiac monitor.  I personally viewed and interpreted the cardiac monitored which  showed an underlying rhythm of: A fib rate controlled  Per my interpretation the patient's ECG shows A fib rate controlled without acute ischemia  I ordered medication including duonebs, solumedrol for suspected COPD exacerbation, IV lasix for CHF  I have reviewed the patients home medicines and have made adjustments as needed  Test Considered: lower suspicion for acute PE in this clinical setting   After the interventions noted above, I reevaluated the patient and found that they have: stayed the same   At this point the patient has new oxygen requirement requiring 5 L of nasal cannula.  She continues to have some tachypnea.  I suspect she likely has a combined congestive heart failure and COPD exacerbation with small bilateral pleural effusions, and she would benefit from IV diuresis and breathing treatments in the hospital.  The patient and her daughter are in agreement with this plan.  Will page hospitalist for admission  Dispostion:  After consideration of the diagnostic results and the patients response to treatment, I feel that the patent would benefit from medical admission.         Final Clinical Impression(s) / ED Diagnoses Final diagnoses:  Shortness of breath  Hypoxia  COPD exacerbation (HCC)  Acute on chronic congestive heart failure, unspecified heart failure type [facility])    Rx / DC Orders ED Discharge Orders     None         Terald Sleeper, MD 11/10/22 1724

## 2022-11-10 NOTE — ED Notes (Signed)
VBG obtained and given to RT.

## 2022-11-10 NOTE — Progress Notes (Signed)
RT NOTE:  Pt weaned down from 10L to 2L with saturations of 92-94%.

## 2022-11-10 NOTE — Progress Notes (Signed)
Plan of Care Note for accepted transfer   Patient: Lindsay [name] MRN: [id number]   DOA: 11/10/2022  Facility requesting transfer: [facility] Requesting Provider: Dr. Renaye Rakers  Reason for transfer: acute on chronic respiratory failure with hypoxia  Facility course: 82 yo with history of asthma with chronic resp. Failure on 2-3L oxygen, chronic diastolic CHF, HLD, atrial fib on eliquis, HTN, anxiety/depression, hypothyroidism, GERD who presented to ED with complaints of shortness of  breath x 5 days   Vitals: 93% on 10L HFNC, afebrile, bP: 92/61, HR 79, RR: 16 In triage she was 76% on 3L [place]  Pertinent labs: BNP: 360, covid negative, troponin wnlx1, vbg pH: 7.3, CO2: 71.6 CXR stable cardiomegaly with bilateral pulmonary edema and pleural effusions.  In ED: given 60mg  of lasix, duonebs, steroids. [facility] asked to admit for CHF and COPD exacerbation.   Plan of care: The patient is accepted for admission to Progressive unit, at [facility]..    Author: Orland Mustard, MD 11/10/2022  Check [email or web address] for on-call coverage.  Nursing staff, Please call [facility] Admits & Consults System-Wide number on Amion as soon as patient's arrival, so appropriate admitting provider can evaluate the pt.

## 2022-11-10 NOTE — ED Notes (Signed)
[place] with  cl called for transport 

## 2022-11-10 NOTE — ED Notes (Signed)
Report given to the next RN.Marland KitchenMarland Kitchen

## 2022-11-11 ENCOUNTER — Inpatient Hospital Stay (HOSPITAL_COMMUNITY): Payer: Medicare HMO

## 2022-11-11 DIAGNOSIS — I4891 Unspecified atrial fibrillation: Secondary | ICD-10-CM | POA: Diagnosis not present

## 2022-11-11 DIAGNOSIS — J9621 Acute and chronic respiratory failure with hypoxia: Secondary | ICD-10-CM

## 2022-11-11 DIAGNOSIS — E1159 Type 2 diabetes mellitus with other circulatory complications: Secondary | ICD-10-CM

## 2022-11-11 DIAGNOSIS — E1169 Type 2 diabetes mellitus with other specified complication: Secondary | ICD-10-CM

## 2022-11-11 DIAGNOSIS — I5032 Chronic diastolic (congestive) heart failure: Secondary | ICD-10-CM

## 2022-11-11 DIAGNOSIS — R9431 Abnormal electrocardiogram [ECG] [EKG]: Secondary | ICD-10-CM

## 2022-11-11 DIAGNOSIS — E785 Hyperlipidemia, unspecified: Secondary | ICD-10-CM

## 2022-11-11 DIAGNOSIS — J9622 Acute and chronic respiratory failure with hypercapnia: Secondary | ICD-10-CM

## 2022-11-11 DIAGNOSIS — I5033 Acute on chronic diastolic (congestive) heart failure: Secondary | ICD-10-CM

## 2022-11-11 DIAGNOSIS — I152 Hypertension secondary to endocrine disorders: Secondary | ICD-10-CM

## 2022-11-11 DIAGNOSIS — J449 Chronic obstructive pulmonary disease, unspecified: Secondary | ICD-10-CM

## 2022-11-11 DIAGNOSIS — I7122 Aneurysm of the aortic arch, without rupture: Secondary | ICD-10-CM

## 2022-11-11 DIAGNOSIS — I4819 Other persistent atrial fibrillation: Secondary | ICD-10-CM

## 2022-11-11 DIAGNOSIS — F411 Generalized anxiety disorder: Secondary | ICD-10-CM

## 2022-11-11 DIAGNOSIS — K219 Gastro-esophageal reflux disease without esophagitis: Secondary | ICD-10-CM

## 2022-11-11 LAB — CBC
HCT: 40.7 % (ref 36.0–46.0)
Hemoglobin: 12.5 g/dL (ref 12.0–15.0)
MCH: 29.2 pg (ref 26.0–34.0)
MCHC: 30.7 g/dL (ref 30.0–36.0)
MCV: 95.1 fL (ref 80.0–100.0)
Platelets: 224 10*3/uL (ref 150–400)
RBC: 4.28 MIL/uL (ref 3.87–5.11)
RDW: 15.8 % — ABNORMAL HIGH (ref 11.5–15.5)
WBC: 11.2 10*3/uL — ABNORMAL HIGH (ref 4.0–10.5)
nRBC: 0 % (ref 0.0–0.2)

## 2022-11-11 LAB — ECHOCARDIOGRAM COMPLETE
AR max vel: 2.19 cm2
AV Area VTI: 1.91 cm2
AV Area mean vel: 2.18 cm2
AV Mean grad: 3 mm[Hg]
AV Peak grad: 5 mm[Hg]
Ao pk vel: 1.12 m/s
Area-P 1/2: 4.26 cm2
Height: 68 in
S' Lateral: 2.4 cm
Weight: 3830.71 [oz_av]

## 2022-11-11 LAB — COMPREHENSIVE METABOLIC PANEL
ALT: 20 U/L (ref 0–44)
AST: 17 U/L (ref 15–41)
Albumin: 3.5 g/dL (ref 3.5–5.0)
Alkaline Phosphatase: 77 U/L (ref 38–126)
Anion gap: 10 (ref 5–15)
BUN: 17 mg/dL (ref 8–23)
CO2: 33 mmol/L — ABNORMAL HIGH (ref 22–32)
Calcium: 9 mg/dL (ref 8.9–10.3)
Chloride: 100 mmol/L (ref 98–111)
Creatinine, Ser: 0.81 mg/dL (ref 0.44–1.00)
GFR, Estimated: >60 mL/min (ref >60)
Glucose, Bld: 119 mg/dL — ABNORMAL HIGH (ref 70–99)
Potassium: 4.2 mmol/L (ref 3.5–5.1)
Sodium: 143 mmol/L (ref 135–145)
Total Bilirubin: 0.7 mg/dL (ref 0.3–1.2)
Total Protein: 7.1 g/dL (ref 6.5–8.1)

## 2022-11-11 LAB — TSH: TSH: 0.37 u[IU]/mL (ref 0.350–4.500)

## 2022-11-11 LAB — GLUCOSE, CAPILLARY
Glucose-Capillary: 122 mg/dL — ABNORMAL HIGH (ref 70–99)
Glucose-Capillary: 120 mg/dL — ABNORMAL HIGH (ref 70–99)
Glucose-Capillary: 138 mg/dL — ABNORMAL HIGH (ref 70–99)
Glucose-Capillary: 117 mg/dL — ABNORMAL HIGH (ref 70–99)

## 2022-11-11 MED ORDER — GUAIFENESIN ER 600 MG PO TB12
1200.0000 mg | ORAL_TABLET | Freq: Two times a day (BID) | ORAL | Status: DC
  Administered 2022-11-11 – 2022-11-16 (×10): 1200 mg via ORAL
  Filled 2022-11-11 (×10): qty 2

## 2022-11-11 MED ORDER — SODIUM CHLORIDE (PF) 0.9 % IJ SOLN
INTRAMUSCULAR | Status: AC
  Filled 2022-11-11: qty 50

## 2022-11-11 MED ORDER — FUROSEMIDE 10 MG/ML IJ SOLN
40.0000 mg | Freq: Two times a day (BID) | INTRAMUSCULAR | Status: DC
  Administered 2022-11-11 – 2022-11-13 (×5): 40 mg via INTRAVENOUS
  Filled 2022-11-11 (×6): qty 4

## 2022-11-11 MED ORDER — PERFLUTREN LIPID MICROSPHERE
1.0000 mL | INTRAVENOUS | Status: AC | PRN
  Administered 2022-11-11: 2 mL via INTRAVENOUS

## 2022-11-11 MED ORDER — IOHEXOL 350 MG/ML SOLN
100.0000 mL | Freq: Once | INTRAVENOUS | Status: AC | PRN
  Administered 2022-11-11: 100 mL via INTRAVENOUS

## 2022-11-11 NOTE — Plan of Care (Signed)

## 2022-11-11 NOTE — Hospital Course (Addendum)
Lindsay [name] is a 82 y.o. female with medical history significant of diastolic CHF, pansinusitis, chronic respiratory failure on home oxygen, asthma, permanent atrial fibrillation, dysphagia, dyspnea, elevated troponin, hyperlipidemia, hypertension, hypokalemia, hyponatremia, LGSIL, moderate persistent asthma.  She presented with worsening dyspnea with associated wheezing, cough, and fatigue. She was started on lasix for CHF and treatment for suspected COPD exacerbation as well.   Assessment and Plan:  Acute on chronic respiratory failure with hypoxia and hypercapnia Acute on Chronic Diastolic CHF COPD exacerbation -Continuous pulse oximetry maintain O2 saturation greater than 90%; on 2L chronic O2 at home - s/p lasix; possibly overdiuresed given some hypoTN and dizziness now; small fluid bolus today - continue lopressor; might need dose reduced still -Evaluated by cardiology recently but new findings on echo from recent visit; now with reduced RV systolic function, enlarged RV, and small pericardial effusion - with hypotension, dizziness, echo changes, and mildly increased O2 demand, would think possible PE but this was note noted on CTA chest on 11/11/22; also considered cardiac tamponade but effusion appears small per echo - will ask for cardiology input while patient still hospitalized  -Continue breathing treatments - Continue doxycycline and Mucinex - Prednisone course completed -BNP has down trended.  Repeat CXR shows persistent perihilar and peripheral interstitial edema vs infiltrate   HTN - continue lopressor for now - small fluid bolus given low BP still and symptomatic  - will need to resume home meds as able   Hyperlipidemia associated with type 2 diabetes mellitus (HCC) -Continue Atorvastatin 40 mg p.o. daily.    Persistent Atrial Fibrillation (HCC) -Continue Apixaban 5 mg p.o. twice daily. - continue lopressor  Depression  GAD (generalized anxiety disorder) -Continue  Lorazepam 1 mg p.o. twice daily as needed. -C/w Sertraline 100 mg po Daily    Hypothyroidism -TSH level 0.370 -Not on Levothyroxine.   Aortic Arch Aneurysm (HCC) -Obtained CTA chest while in the hospital and showed "Mild dilatation of the aortic arch a 4 cm, stable"   Normocytic Anemia - now back to normal range?  - B12 low and on supplementation  -Folate low/normal -Iron stores adequate  GERD/GI Prophylaxis -C/w Pantoprazole 20 mg po Daily   Obesity -Complicates overall prognosis and care -Estimated body mass index is 37.81 kg/m as calculated from the following:   Height as of this encounter: 5\' 8"  (1.727 m).   Weight as of this encounter: 112.8 kg.  -Weight Loss and Dietary Counseling given

## 2022-11-11 NOTE — Progress Notes (Signed)
PROGRESS NOTE    Cellia Reik  FUX:[id number] DOB: 1940-10-18 DOA: 11/10/2022 PCP: Tollie Eth, NP   Brief Narrative:  HPI per Dr. Sanda Klein   Lindsay [name] is a 82 y.o. female with medical history significant of diastolic CHF, pansinusitis, chronic respiratory failure on home oxygen, asthma, permanent atrial fibrillation, dysphagia, dyspnea, elevated troponin, hyperlipidemia, hypertension, hypokalemia, hyponatremia LGSIL, moderate persistent asthma has been having progressively worse dyspnea associated with wheezing, nonproductive cough and fatigue since Monday.  She stated that she was served a meal at the facility that felt salty to taste on Sunday or Monday.  No fever or chills.  No sore throat.  No travel history or sick contacts.  No fever, chills or night sweats. No sore throat, rhinorrhea, dyspnea, wheezing or hemoptysis.  No chest pain, palpitations, diaphoresis, PND, or orthopnea.  No abdominal pain, diarrhea, constipation, melena or hematochezia.  No flank pain, dysuria, frequency or hematuria.  No polyuria, polydipsia, polyphagia or blurred vision.   **Interim History   Continuing Diuresis and increased the dose to Lasix 40 mg po BID. Will need repeat CXR in the AM   Assessment and Plan:  Acute on chronic respiratory failure with hypoxia and hypercapnia (HCC) in the setting of Acute on Chronic Diastolic CHF (congestive heart failure) (HCC) and superimposed on Chronic obstructive pulmonary disease (HCC) -Changed to Inpatient Progressive -Continuous pulse oximetry maintain O2 saturation greater than 90% Continue supplemental oxygen via nasal cannula and wean to home regimen  -Continue with scheduled and as needed bronchodilators. -Strict I's and O's and daily weights with sodium and fluid restriction.  Intake/Output Summary (Last 24 hours) at 11/11/2022 1822 Last data filed at 11/11/2022 1755 Gross per 24 hour  Intake 680 ml  Output 1300 ml  Net -620 ml  -Continue Furosemide  but changed from 40 mg IV daily to twice daily -C/w Lisiniopril 5 mg po Daily and Currently Holding Metoprolol  -BNP was elevated at 360.2 -Repeat ECHOCardiogram was done and showed "Left ventricular ejection fraction, by estimation, is 55 to 60%. The left ventricle has normal function. The left ventricle has no regional wall motion abnormalities. There is mild concentric left ventricular hypertrophy. Left ventricular diastolic parameters are indeterminate.  RV McConnell's Sign oted. Right ventricular systolic function is mildly reduced. The right ventricular size is severely enlarged.  Left atrial size was moderately dilated. Right atrial size was moderately dilated. A small pericardial effusion is present. The pericardial effusion is surrounding the apex. The mitral valve was not well visualized. No evidence of mitral valve regurgitation. No evidence of mitral stenosis. Tricuspid valve regurgitation is mild to moderate. The aortic valve was not well visualized. There is mild calcification of the aortic valve. There is mild thickening of the aortic valve. Aortic valve regurgitation is not visualized. 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 Aortic atherosclerosis.  No acute aortic findings. Multi chamber cardiomegaly. Moderate right and small to moderate left pleural effusions with associated compressive atelectasis. Heterogeneous areas of ground-glass in both lungs may represent edema. Chronic compression deformities of T7, T8, T11, T12, and upper lumbar spine." -Continue to monitor for signs and symptoms of volume overload and repeat chest x-ray in a.m.   Hypertension associated with diabetes (HCC) -Holding metoprolol. -Started low-dose lisinopril for tomorrow morning. -Continue furosemide  PROGRESS NOTE    Cellia Reik  FUX:[id number] DOB: 1940-10-18 DOA: 11/10/2022 PCP: Tollie Eth, NP   Brief Narrative:  HPI per Dr. Sanda Klein   Lindsay [name] is a 82 y.o. female with medical history significant of diastolic CHF, pansinusitis, chronic respiratory failure on home oxygen, asthma, permanent atrial fibrillation, dysphagia, dyspnea, elevated troponin, hyperlipidemia, hypertension, hypokalemia, hyponatremia LGSIL, moderate persistent asthma has been having progressively worse dyspnea associated with wheezing, nonproductive cough and fatigue since Monday.  She stated that she was served a meal at the facility that felt salty to taste on Sunday or Monday.  No fever or chills.  No sore throat.  No travel history or sick contacts.  No fever, chills or night sweats. No sore throat, rhinorrhea, dyspnea, wheezing or hemoptysis.  No chest pain, palpitations, diaphoresis, PND, or orthopnea.  No abdominal pain, diarrhea, constipation, melena or hematochezia.  No flank pain, dysuria, frequency or hematuria.  No polyuria, polydipsia, polyphagia or blurred vision.   **Interim History   Continuing Diuresis and increased the dose to Lasix 40 mg po BID. Will need repeat CXR in the AM   Assessment and Plan:  Acute on chronic respiratory failure with hypoxia and hypercapnia (HCC) in the setting of Acute on Chronic Diastolic CHF (congestive heart failure) (HCC) and superimposed on Chronic obstructive pulmonary disease (HCC) -Changed to Inpatient Progressive -Continuous pulse oximetry maintain O2 saturation greater than 90% Continue supplemental oxygen via nasal cannula and wean to home regimen  -Continue with scheduled and as needed bronchodilators. -Strict I's and O's and daily weights with sodium and fluid restriction.  Intake/Output Summary (Last 24 hours) at 11/11/2022 1822 Last data filed at 11/11/2022 1755 Gross per 24 hour  Intake 680 ml  Output 1300 ml  Net -620 ml  -Continue Furosemide  but changed from 40 mg IV daily to twice daily -C/w Lisiniopril 5 mg po Daily and Currently Holding Metoprolol  -BNP was elevated at 360.2 -Repeat ECHOCardiogram was done and showed "Left ventricular ejection fraction, by estimation, is 55 to 60%. The left ventricle has normal function. The left ventricle has no regional wall motion abnormalities. There is mild concentric left ventricular hypertrophy. Left ventricular diastolic parameters are indeterminate.  RV McConnell's Sign oted. Right ventricular systolic function is mildly reduced. The right ventricular size is severely enlarged.  Left atrial size was moderately dilated. Right atrial size was moderately dilated. A small pericardial effusion is present. The pericardial effusion is surrounding the apex. The mitral valve was not well visualized. No evidence of mitral valve regurgitation. No evidence of mitral stenosis. Tricuspid valve regurgitation is mild to moderate. The aortic valve was not well visualized. There is mild calcification of the aortic valve. There is mild thickening of the aortic valve. Aortic valve regurgitation is not visualized. 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 Aortic atherosclerosis.  No acute aortic findings. Multi chamber cardiomegaly. Moderate right and small to moderate left pleural effusions with associated compressive atelectasis. Heterogeneous areas of ground-glass in both lungs may represent edema. Chronic compression deformities of T7, T8, T11, T12, and upper lumbar spine." -Continue to monitor for signs and symptoms of volume overload and repeat chest x-ray in a.m.   Hypertension associated with diabetes (HCC) -Holding metoprolol. -Started low-dose lisinopril for tomorrow morning. -Continue furosemide  is tortuous. No aortic dissection or acute aortic findings. Multi chamber cardiomegaly. No pericardial effusion. No central pulmonary embolus. Mediastinum/Nodes: No mediastinal or hilar adenopathy. Patulous esophagus but no wall thickening. Lungs/Pleura: Moderate right and small to moderate left pleural effusion. There is associated compressive atelectasis. Heterogeneous areas of ground-glass in both lungs may represent edema. Upper Abdomen: Chronic splenic calcifications. No acute upper abdominal findings. Musculoskeletal: Chronic severe compression deformities of T7 and T8. Chronic moderate to severe compression deformities of T11 and T12. Chronic upper lumbar compression deformities. There are no acute or suspicious osseous abnormalities. Review of the MIP images confirms the above findings. IMPRESSION: 1. 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 2. Aortic atherosclerosis.  No acute aortic findings. 3. Multi chamber cardiomegaly. Moderate right and small to moderate left pleural effusions with associated compressive atelectasis.  Heterogeneous areas of ground-glass in both lungs may represent edema. 4. Chronic compression deformities of T7, T8, T11, T12, and upper lumbar spine. Electronically Signed   By: Narda Rutherford M.D.   On: 11/11/2022 15:25   ECHOCARDIOGRAM COMPLETE  Result Date: 11/11/2022    ECHOCARDIOGRAM REPORT   Patient Name:   MALEIGHA FOWLER Date of Exam: 11/11/2022 Medical Rec #:  [id number]  Height:       68.0 in Accession #:    [id number] Weight:       239.4 lb Date of Birth:  1940/06/13  BSA:          2.206 m Patient Age:    82 years   BP:           98/60 mmHg Patient Gender: F          HR:           85 bpm. Exam Location:  Inpatient Procedure: 2D Echo, Cardiac Doppler, Color Doppler and Intracardiac            Opacification Agent Indications:    Congestive Heart Failure I50.9                 Abnormal ECG R94.31                 Atrial Fibrillation I48.91  History:        Patient has prior history of Echocardiogram examinations, most                 recent 05/10/2021. CHF, COPD, Signs/Symptoms:Dyspnea; Risk                 Factors:Hypertension.  Sonographer:    Darlys Gales Referring Phys: [id number] DAVID MANUEL ORTIZ IMPRESSIONS  1. Left ventricular ejection fraction, by estimation, is 55 to 60%. The left ventricle has normal function. The left ventricle has no regional wall motion abnormalities. There is mild concentric left ventricular hypertrophy. Left ventricular diastolic parameters are indeterminate.  2. RV McConnell's Sign oted. Right ventricular systolic function is mildly reduced. The right ventricular size is severely enlarged.  3. Left atrial size was moderately dilated.  4. Right atrial size was moderately dilated.  5. A small pericardial effusion is present. The pericardial effusion is surrounding the apex.  6. The mitral valve was not well visualized. No evidence of mitral valve regurgitation. No evidence of mitral stenosis.  7. Tricuspid valve regurgitation is mild to moderate.  8. The aortic valve was not well  visualized. There is mild calcification of the aortic valve. There is mild thickening of the aortic valve. Aortic valve regurgitation is not visualized. Comparison(s): Prior images reviewed  is tortuous. No aortic dissection or acute aortic findings. Multi chamber cardiomegaly. No pericardial effusion. No central pulmonary embolus. Mediastinum/Nodes: No mediastinal or hilar adenopathy. Patulous esophagus but no wall thickening. Lungs/Pleura: Moderate right and small to moderate left pleural effusion. There is associated compressive atelectasis. Heterogeneous areas of ground-glass in both lungs may represent edema. Upper Abdomen: Chronic splenic calcifications. No acute upper abdominal findings. Musculoskeletal: Chronic severe compression deformities of T7 and T8. Chronic moderate to severe compression deformities of T11 and T12. Chronic upper lumbar compression deformities. There are no acute or suspicious osseous abnormalities. Review of the MIP images confirms the above findings. IMPRESSION: 1. 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 2. Aortic atherosclerosis.  No acute aortic findings. 3. Multi chamber cardiomegaly. Moderate right and small to moderate left pleural effusions with associated compressive atelectasis.  Heterogeneous areas of ground-glass in both lungs may represent edema. 4. Chronic compression deformities of T7, T8, T11, T12, and upper lumbar spine. Electronically Signed   By: Narda Rutherford M.D.   On: 11/11/2022 15:25   ECHOCARDIOGRAM COMPLETE  Result Date: 11/11/2022    ECHOCARDIOGRAM REPORT   Patient Name:   MALEIGHA FOWLER Date of Exam: 11/11/2022 Medical Rec #:  [id number]  Height:       68.0 in Accession #:    [id number] Weight:       239.4 lb Date of Birth:  1940/06/13  BSA:          2.206 m Patient Age:    82 years   BP:           98/60 mmHg Patient Gender: F          HR:           85 bpm. Exam Location:  Inpatient Procedure: 2D Echo, Cardiac Doppler, Color Doppler and Intracardiac            Opacification Agent Indications:    Congestive Heart Failure I50.9                 Abnormal ECG R94.31                 Atrial Fibrillation I48.91  History:        Patient has prior history of Echocardiogram examinations, most                 recent 05/10/2021. CHF, COPD, Signs/Symptoms:Dyspnea; Risk                 Factors:Hypertension.  Sonographer:    Darlys Gales Referring Phys: [id number] DAVID MANUEL ORTIZ IMPRESSIONS  1. Left ventricular ejection fraction, by estimation, is 55 to 60%. The left ventricle has normal function. The left ventricle has no regional wall motion abnormalities. There is mild concentric left ventricular hypertrophy. Left ventricular diastolic parameters are indeterminate.  2. RV McConnell's Sign oted. Right ventricular systolic function is mildly reduced. The right ventricular size is severely enlarged.  3. Left atrial size was moderately dilated.  4. Right atrial size was moderately dilated.  5. A small pericardial effusion is present. The pericardial effusion is surrounding the apex.  6. The mitral valve was not well visualized. No evidence of mitral valve regurgitation. No evidence of mitral stenosis.  7. Tricuspid valve regurgitation is mild to moderate.  8. The aortic valve was not well  visualized. There is mild calcification of the aortic valve. There is mild thickening of the aortic valve. Aortic valve regurgitation is not visualized. Comparison(s): Prior images reviewed  PROGRESS NOTE    Cellia Reik  FUX:[id number] DOB: 1940-10-18 DOA: 11/10/2022 PCP: Tollie Eth, NP   Brief Narrative:  HPI per Dr. Sanda Klein   Lindsay [name] is a 82 y.o. female with medical history significant of diastolic CHF, pansinusitis, chronic respiratory failure on home oxygen, asthma, permanent atrial fibrillation, dysphagia, dyspnea, elevated troponin, hyperlipidemia, hypertension, hypokalemia, hyponatremia LGSIL, moderate persistent asthma has been having progressively worse dyspnea associated with wheezing, nonproductive cough and fatigue since Monday.  She stated that she was served a meal at the facility that felt salty to taste on Sunday or Monday.  No fever or chills.  No sore throat.  No travel history or sick contacts.  No fever, chills or night sweats. No sore throat, rhinorrhea, dyspnea, wheezing or hemoptysis.  No chest pain, palpitations, diaphoresis, PND, or orthopnea.  No abdominal pain, diarrhea, constipation, melena or hematochezia.  No flank pain, dysuria, frequency or hematuria.  No polyuria, polydipsia, polyphagia or blurred vision.   **Interim History   Continuing Diuresis and increased the dose to Lasix 40 mg po BID. Will need repeat CXR in the AM   Assessment and Plan:  Acute on chronic respiratory failure with hypoxia and hypercapnia (HCC) in the setting of Acute on Chronic Diastolic CHF (congestive heart failure) (HCC) and superimposed on Chronic obstructive pulmonary disease (HCC) -Changed to Inpatient Progressive -Continuous pulse oximetry maintain O2 saturation greater than 90% Continue supplemental oxygen via nasal cannula and wean to home regimen  -Continue with scheduled and as needed bronchodilators. -Strict I's and O's and daily weights with sodium and fluid restriction.  Intake/Output Summary (Last 24 hours) at 11/11/2022 1822 Last data filed at 11/11/2022 1755 Gross per 24 hour  Intake 680 ml  Output 1300 ml  Net -620 ml  -Continue Furosemide  but changed from 40 mg IV daily to twice daily -C/w Lisiniopril 5 mg po Daily and Currently Holding Metoprolol  -BNP was elevated at 360.2 -Repeat ECHOCardiogram was done and showed "Left ventricular ejection fraction, by estimation, is 55 to 60%. The left ventricle has normal function. The left ventricle has no regional wall motion abnormalities. There is mild concentric left ventricular hypertrophy. Left ventricular diastolic parameters are indeterminate.  RV McConnell's Sign oted. Right ventricular systolic function is mildly reduced. The right ventricular size is severely enlarged.  Left atrial size was moderately dilated. Right atrial size was moderately dilated. A small pericardial effusion is present. The pericardial effusion is surrounding the apex. The mitral valve was not well visualized. No evidence of mitral valve regurgitation. No evidence of mitral stenosis. Tricuspid valve regurgitation is mild to moderate. The aortic valve was not well visualized. There is mild calcification of the aortic valve. There is mild thickening of the aortic valve. Aortic valve regurgitation is not visualized. 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 Aortic atherosclerosis.  No acute aortic findings. Multi chamber cardiomegaly. Moderate right and small to moderate left pleural effusions with associated compressive atelectasis. Heterogeneous areas of ground-glass in both lungs may represent edema. Chronic compression deformities of T7, T8, T11, T12, and upper lumbar spine." -Continue to monitor for signs and symptoms of volume overload and repeat chest x-ray in a.m.   Hypertension associated with diabetes (HCC) -Holding metoprolol. -Started low-dose lisinopril for tomorrow morning. -Continue furosemide  PROGRESS NOTE    Cellia Reik  FUX:[id number] DOB: 1940-10-18 DOA: 11/10/2022 PCP: Tollie Eth, NP   Brief Narrative:  HPI per Dr. Sanda Klein   Lindsay [name] is a 82 y.o. female with medical history significant of diastolic CHF, pansinusitis, chronic respiratory failure on home oxygen, asthma, permanent atrial fibrillation, dysphagia, dyspnea, elevated troponin, hyperlipidemia, hypertension, hypokalemia, hyponatremia LGSIL, moderate persistent asthma has been having progressively worse dyspnea associated with wheezing, nonproductive cough and fatigue since Monday.  She stated that she was served a meal at the facility that felt salty to taste on Sunday or Monday.  No fever or chills.  No sore throat.  No travel history or sick contacts.  No fever, chills or night sweats. No sore throat, rhinorrhea, dyspnea, wheezing or hemoptysis.  No chest pain, palpitations, diaphoresis, PND, or orthopnea.  No abdominal pain, diarrhea, constipation, melena or hematochezia.  No flank pain, dysuria, frequency or hematuria.  No polyuria, polydipsia, polyphagia or blurred vision.   **Interim History   Continuing Diuresis and increased the dose to Lasix 40 mg po BID. Will need repeat CXR in the AM   Assessment and Plan:  Acute on chronic respiratory failure with hypoxia and hypercapnia (HCC) in the setting of Acute on Chronic Diastolic CHF (congestive heart failure) (HCC) and superimposed on Chronic obstructive pulmonary disease (HCC) -Changed to Inpatient Progressive -Continuous pulse oximetry maintain O2 saturation greater than 90% Continue supplemental oxygen via nasal cannula and wean to home regimen  -Continue with scheduled and as needed bronchodilators. -Strict I's and O's and daily weights with sodium and fluid restriction.  Intake/Output Summary (Last 24 hours) at 11/11/2022 1822 Last data filed at 11/11/2022 1755 Gross per 24 hour  Intake 680 ml  Output 1300 ml  Net -620 ml  -Continue Furosemide  but changed from 40 mg IV daily to twice daily -C/w Lisiniopril 5 mg po Daily and Currently Holding Metoprolol  -BNP was elevated at 360.2 -Repeat ECHOCardiogram was done and showed "Left ventricular ejection fraction, by estimation, is 55 to 60%. The left ventricle has normal function. The left ventricle has no regional wall motion abnormalities. There is mild concentric left ventricular hypertrophy. Left ventricular diastolic parameters are indeterminate.  RV McConnell's Sign oted. Right ventricular systolic function is mildly reduced. The right ventricular size is severely enlarged.  Left atrial size was moderately dilated. Right atrial size was moderately dilated. A small pericardial effusion is present. The pericardial effusion is surrounding the apex. The mitral valve was not well visualized. No evidence of mitral valve regurgitation. No evidence of mitral stenosis. Tricuspid valve regurgitation is mild to moderate. The aortic valve was not well visualized. There is mild calcification of the aortic valve. There is mild thickening of the aortic valve. Aortic valve regurgitation is not visualized. 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 Aortic atherosclerosis.  No acute aortic findings. Multi chamber cardiomegaly. Moderate right and small to moderate left pleural effusions with associated compressive atelectasis. Heterogeneous areas of ground-glass in both lungs may represent edema. Chronic compression deformities of T7, T8, T11, T12, and upper lumbar spine." -Continue to monitor for signs and symptoms of volume overload and repeat chest x-ray in a.m.   Hypertension associated with diabetes (HCC) -Holding metoprolol. -Started low-dose lisinopril for tomorrow morning. -Continue furosemide  is tortuous. No aortic dissection or acute aortic findings. Multi chamber cardiomegaly. No pericardial effusion. No central pulmonary embolus. Mediastinum/Nodes: No mediastinal or hilar adenopathy. Patulous esophagus but no wall thickening. Lungs/Pleura: Moderate right and small to moderate left pleural effusion. There is associated compressive atelectasis. Heterogeneous areas of ground-glass in both lungs may represent edema. Upper Abdomen: Chronic splenic calcifications. No acute upper abdominal findings. Musculoskeletal: Chronic severe compression deformities of T7 and T8. Chronic moderate to severe compression deformities of T11 and T12. Chronic upper lumbar compression deformities. There are no acute or suspicious osseous abnormalities. Review of the MIP images confirms the above findings. IMPRESSION: 1. 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 2. Aortic atherosclerosis.  No acute aortic findings. 3. Multi chamber cardiomegaly. Moderate right and small to moderate left pleural effusions with associated compressive atelectasis.  Heterogeneous areas of ground-glass in both lungs may represent edema. 4. Chronic compression deformities of T7, T8, T11, T12, and upper lumbar spine. Electronically Signed   By: Narda Rutherford M.D.   On: 11/11/2022 15:25   ECHOCARDIOGRAM COMPLETE  Result Date: 11/11/2022    ECHOCARDIOGRAM REPORT   Patient Name:   MALEIGHA FOWLER Date of Exam: 11/11/2022 Medical Rec #:  [id number]  Height:       68.0 in Accession #:    [id number] Weight:       239.4 lb Date of Birth:  1940/06/13  BSA:          2.206 m Patient Age:    82 years   BP:           98/60 mmHg Patient Gender: F          HR:           85 bpm. Exam Location:  Inpatient Procedure: 2D Echo, Cardiac Doppler, Color Doppler and Intracardiac            Opacification Agent Indications:    Congestive Heart Failure I50.9                 Abnormal ECG R94.31                 Atrial Fibrillation I48.91  History:        Patient has prior history of Echocardiogram examinations, most                 recent 05/10/2021. CHF, COPD, Signs/Symptoms:Dyspnea; Risk                 Factors:Hypertension.  Sonographer:    Darlys Gales Referring Phys: [id number] DAVID MANUEL ORTIZ IMPRESSIONS  1. Left ventricular ejection fraction, by estimation, is 55 to 60%. The left ventricle has normal function. The left ventricle has no regional wall motion abnormalities. There is mild concentric left ventricular hypertrophy. Left ventricular diastolic parameters are indeterminate.  2. RV McConnell's Sign oted. Right ventricular systolic function is mildly reduced. The right ventricular size is severely enlarged.  3. Left atrial size was moderately dilated.  4. Right atrial size was moderately dilated.  5. A small pericardial effusion is present. The pericardial effusion is surrounding the apex.  6. The mitral valve was not well visualized. No evidence of mitral valve regurgitation. No evidence of mitral stenosis.  7. Tricuspid valve regurgitation is mild to moderate.  8. The aortic valve was not well  visualized. There is mild calcification of the aortic valve. There is mild thickening of the aortic valve. Aortic valve regurgitation is not visualized. Comparison(s): Prior images reviewed  is tortuous. No aortic dissection or acute aortic findings. Multi chamber cardiomegaly. No pericardial effusion. No central pulmonary embolus. Mediastinum/Nodes: No mediastinal or hilar adenopathy. Patulous esophagus but no wall thickening. Lungs/Pleura: Moderate right and small to moderate left pleural effusion. There is associated compressive atelectasis. Heterogeneous areas of ground-glass in both lungs may represent edema. Upper Abdomen: Chronic splenic calcifications. No acute upper abdominal findings. Musculoskeletal: Chronic severe compression deformities of T7 and T8. Chronic moderate to severe compression deformities of T11 and T12. Chronic upper lumbar compression deformities. There are no acute or suspicious osseous abnormalities. Review of the MIP images confirms the above findings. IMPRESSION: 1. 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 2. Aortic atherosclerosis.  No acute aortic findings. 3. Multi chamber cardiomegaly. Moderate right and small to moderate left pleural effusions with associated compressive atelectasis.  Heterogeneous areas of ground-glass in both lungs may represent edema. 4. Chronic compression deformities of T7, T8, T11, T12, and upper lumbar spine. Electronically Signed   By: Narda Rutherford M.D.   On: 11/11/2022 15:25   ECHOCARDIOGRAM COMPLETE  Result Date: 11/11/2022    ECHOCARDIOGRAM REPORT   Patient Name:   MALEIGHA FOWLER Date of Exam: 11/11/2022 Medical Rec #:  [id number]  Height:       68.0 in Accession #:    [id number] Weight:       239.4 lb Date of Birth:  1940/06/13  BSA:          2.206 m Patient Age:    82 years   BP:           98/60 mmHg Patient Gender: F          HR:           85 bpm. Exam Location:  Inpatient Procedure: 2D Echo, Cardiac Doppler, Color Doppler and Intracardiac            Opacification Agent Indications:    Congestive Heart Failure I50.9                 Abnormal ECG R94.31                 Atrial Fibrillation I48.91  History:        Patient has prior history of Echocardiogram examinations, most                 recent 05/10/2021. CHF, COPD, Signs/Symptoms:Dyspnea; Risk                 Factors:Hypertension.  Sonographer:    Darlys Gales Referring Phys: [id number] DAVID MANUEL ORTIZ IMPRESSIONS  1. Left ventricular ejection fraction, by estimation, is 55 to 60%. The left ventricle has normal function. The left ventricle has no regional wall motion abnormalities. There is mild concentric left ventricular hypertrophy. Left ventricular diastolic parameters are indeterminate.  2. RV McConnell's Sign oted. Right ventricular systolic function is mildly reduced. The right ventricular size is severely enlarged.  3. Left atrial size was moderately dilated.  4. Right atrial size was moderately dilated.  5. A small pericardial effusion is present. The pericardial effusion is surrounding the apex.  6. The mitral valve was not well visualized. No evidence of mitral valve regurgitation. No evidence of mitral stenosis.  7. Tricuspid valve regurgitation is mild to moderate.  8. The aortic valve was not well  visualized. There is mild calcification of the aortic valve. There is mild thickening of the aortic valve. Aortic valve regurgitation is not visualized. Comparison(s): Prior images reviewed

## 2022-11-11 NOTE — Progress Notes (Signed)
   11/11/22 2009  BiPAP/CPAP/SIPAP  Reason BIPAP/CPAP not in use Other(comment) (pt doesnt use anything at home only O2. refused again tonight)  BiPAP/CPAP /SiPAP Vitals  Resp 17  MEWS Score/Color  MEWS Score 0  MEWS Score Color [place]

## 2022-11-11 NOTE — Plan of Care (Signed)
  Problem: Education: Goal: Knowledge of General Education information will improve Description: Including pain rating scale, medication(s)/side effects and non-pharmacologic comfort measures Outcome: Progressing   Problem: Clinical Measurements: Goal: Ability to maintain clinical measurements within normal limits will improve Outcome: Progressing   

## 2022-11-12 ENCOUNTER — Inpatient Hospital Stay (HOSPITAL_COMMUNITY): Payer: Medicare HMO

## 2022-11-12 ENCOUNTER — Encounter (HOSPITAL_BASED_OUTPATIENT_CLINIC_OR_DEPARTMENT_OTHER): Payer: Self-pay

## 2022-11-12 DIAGNOSIS — E1159 Type 2 diabetes mellitus with other circulatory complications: Secondary | ICD-10-CM | POA: Diagnosis not present

## 2022-11-12 DIAGNOSIS — K219 Gastro-esophageal reflux disease without esophagitis: Secondary | ICD-10-CM | POA: Diagnosis not present

## 2022-11-12 DIAGNOSIS — E039 Hypothyroidism, unspecified: Secondary | ICD-10-CM

## 2022-11-12 DIAGNOSIS — E1169 Type 2 diabetes mellitus with other specified complication: Secondary | ICD-10-CM | POA: Diagnosis not present

## 2022-11-12 DIAGNOSIS — J9621 Acute and chronic respiratory failure with hypoxia: Secondary | ICD-10-CM | POA: Diagnosis not present

## 2022-11-12 LAB — CBC WITH DIFFERENTIAL/PLATELET
Abs Immature Granulocytes: 0.05 10*3/uL (ref 0.00–0.07)
Basophils Absolute: 0 10*3/uL (ref 0.0–0.1)
Basophils Relative: 0 %
Eosinophils Absolute: 0 10*3/uL (ref 0.0–0.5)
Eosinophils Relative: 0 %
HCT: 38.6 % (ref 36.0–46.0)
Hemoglobin: 11.7 g/dL — ABNORMAL LOW (ref 12.0–15.0)
Immature Granulocytes: 1 %
Lymphocytes Relative: 8 %
Lymphs Abs: 0.8 10*3/uL (ref 0.7–4.0)
MCH: 29 pg (ref 26.0–34.0)
MCHC: 30.3 g/dL (ref 30.0–36.0)
MCV: 95.5 fL (ref 80.0–100.0)
Monocytes Absolute: 0.6 10*3/uL (ref 0.1–1.0)
Monocytes Relative: 6 %
Neutro Abs: 8.4 10*3/uL — ABNORMAL HIGH (ref 1.7–7.7)
Neutrophils Relative %: 85 %
Platelets: 257 10*3/uL (ref 150–400)
RBC: 4.04 MIL/uL (ref 3.87–5.11)
RDW: 16.2 % — ABNORMAL HIGH (ref 11.5–15.5)
WBC: 9.9 10*3/uL (ref 4.0–10.5)
nRBC: 0 % (ref 0.0–0.2)

## 2022-11-12 LAB — GLUCOSE, CAPILLARY
Glucose-Capillary: 102 mg/dL — ABNORMAL HIGH (ref 70–99)
Glucose-Capillary: 112 mg/dL — ABNORMAL HIGH (ref 70–99)
Glucose-Capillary: 164 mg/dL — ABNORMAL HIGH (ref 70–99)
Glucose-Capillary: 120 mg/dL — ABNORMAL HIGH (ref 70–99)

## 2022-11-12 LAB — COMPREHENSIVE METABOLIC PANEL
ALT: 19 U/L (ref 0–44)
AST: 17 U/L (ref 15–41)
Albumin: 3.5 g/dL (ref 3.5–5.0)
Alkaline Phosphatase: 71 U/L (ref 38–126)
Anion gap: 9 (ref 5–15)
BUN: 25 mg/dL — ABNORMAL HIGH (ref 8–23)
CO2: 33 mmol/L — ABNORMAL HIGH (ref 22–32)
Calcium: 9 mg/dL (ref 8.9–10.3)
Chloride: 101 mmol/L (ref 98–111)
Creatinine, Ser: 0.95 mg/dL (ref 0.44–1.00)
GFR, Estimated: 60 mL/min — ABNORMAL LOW (ref >60)
Glucose, Bld: 92 mg/dL (ref 70–99)
Potassium: 3.9 mmol/L (ref 3.5–5.1)
Sodium: 143 mmol/L (ref 135–145)
Total Bilirubin: 0.8 mg/dL (ref 0.3–1.2)
Total Protein: 7.1 g/dL (ref 6.5–8.1)

## 2022-11-12 LAB — MAGNESIUM: Magnesium: 2.5 mg/dL — ABNORMAL HIGH (ref 1.7–2.4)

## 2022-11-12 LAB — PHOSPHORUS: Phosphorus: 4.6 mg/dL (ref 2.5–4.6)

## 2022-11-12 LAB — IRON AND TIBC
Iron: 45 ug/dL (ref 28–170)
Saturation Ratios: 11 % (ref 10.4–31.8)
TIBC: 409 ug/dL (ref 250–450)
UIBC: 364 ug/dL

## 2022-11-12 LAB — FERRITIN: Ferritin: 27 ng/mL (ref 11–307)

## 2022-11-12 LAB — VITAMIN B12: Vitamin B-12: 163 pg/mL — ABNORMAL LOW (ref 180–914)

## 2022-11-12 LAB — RETICULOCYTES
Immature Retic Fract: 23.1 % — ABNORMAL HIGH (ref 2.3–15.9)
RBC.: 4.03 MIL/uL (ref 3.87–5.11)
Retic Count, Absolute: 100.8 10*3/uL (ref 19.0–186.0)
Retic Ct Pct: 2.5 % (ref 0.4–3.1)

## 2022-11-12 LAB — FOLATE: Folate: 6.4 ng/mL (ref >5.9)

## 2022-11-12 MED ORDER — CYANOCOBALAMIN 1000 MCG/ML IJ SOLN
1000.0000 ug | Freq: Once | INTRAMUSCULAR | Status: AC
  Administered 2022-11-12: 1000 ug via INTRAMUSCULAR
  Filled 2022-11-12: qty 1

## 2022-11-12 MED ORDER — VITAMIN B-12 1000 MCG PO TABS
1000.0000 ug | ORAL_TABLET | Freq: Every day | ORAL | Status: DC
  Administered 2022-11-13 – 2022-11-16 (×4): 1000 ug via ORAL
  Filled 2022-11-12 (×4): qty 1

## 2022-11-12 NOTE — Progress Notes (Signed)
Lungs/Pleura: Moderate right and small to moderate left pleural effusion. There is associated compressive atelectasis. Heterogeneous areas of ground-glass in both lungs may represent edema. Upper Abdomen: Chronic splenic calcifications. No acute upper abdominal findings. Musculoskeletal: Chronic severe compression deformities of T7 and T8. Chronic moderate to severe compression deformities of T11 and T12. Chronic upper lumbar compression deformities. There are no acute or suspicious osseous abnormalities. Review of the MIP images confirms the above findings. IMPRESSION: 1. 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 2. Aortic atherosclerosis.  No acute aortic findings. 3. Multi chamber cardiomegaly. Moderate right and small to moderate left pleural effusions with associated compressive atelectasis. Heterogeneous areas of ground-glass in both lungs may represent edema. 4. Chronic compression deformities of T7, T8, T11, T12, and upper lumbar spine. Electronically Signed   By: Narda Rutherford M.D.   On: 11/11/2022 15:25   ECHOCARDIOGRAM COMPLETE  Result Date: 11/11/2022     ECHOCARDIOGRAM REPORT   Patient Name:   Lindsay [name] Date of Exam: 11/11/2022 Medical Rec #:  [id number]  Height:       68.0 in Accession #:    [id number] Weight:       239.4 lb Date of Birth:  08-26-1940  BSA:          2.206 m Patient Age:    82 years   BP:           98/60 mmHg Patient Gender: F          HR:           85 bpm. Exam Location:  Inpatient Procedure: 2D Echo, Cardiac Doppler, Color Doppler and Intracardiac            Opacification Agent Indications:    Congestive Heart Failure I50.9                 Abnormal ECG R94.31                 Atrial Fibrillation I48.91  History:        Patient has prior history of Echocardiogram examinations, most                 recent 05/10/2021. CHF, COPD, Signs/Symptoms:Dyspnea; Risk                 Factors:Hypertension.  Sonographer:    Darlys Gales Referring Phys: [id number] DAVID MANUEL ORTIZ IMPRESSIONS  1. Left ventricular ejection fraction, by estimation, is 55 to 60%. The left ventricle has normal function. The left ventricle has no regional wall motion abnormalities. There is mild concentric left ventricular hypertrophy. Left ventricular diastolic parameters are indeterminate.  2. RV McConnell's Sign oted. Right ventricular systolic function is mildly reduced. The right ventricular size is severely enlarged.  3. Left atrial size was moderately dilated.  4. Right atrial size was moderately dilated.  5. A small pericardial effusion is present. The pericardial effusion is surrounding the apex.  6. The mitral valve was not well visualized. No evidence of mitral valve regurgitation. No evidence of mitral stenosis.  7. Tricuspid valve regurgitation is mild to moderate.  8. The aortic valve was not well visualized. There is mild calcification of the aortic valve. There is mild thickening of the aortic valve. Aortic valve regurgitation is not visualized. Comparison(s): Prior images reviewed side by side. RV not well visualized in prior study (basal function normal). FINDINGS  Left  Ventricle: Left ventricular ejection fraction, by estimation, is 55 to 60%. The left ventricle has  Lungs/Pleura: Moderate right and small to moderate left pleural effusion. There is associated compressive atelectasis. Heterogeneous areas of ground-glass in both lungs may represent edema. Upper Abdomen: Chronic splenic calcifications. No acute upper abdominal findings. Musculoskeletal: Chronic severe compression deformities of T7 and T8. Chronic moderate to severe compression deformities of T11 and T12. Chronic upper lumbar compression deformities. There are no acute or suspicious osseous abnormalities. Review of the MIP images confirms the above findings. IMPRESSION: 1. 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 2. Aortic atherosclerosis.  No acute aortic findings. 3. Multi chamber cardiomegaly. Moderate right and small to moderate left pleural effusions with associated compressive atelectasis. Heterogeneous areas of ground-glass in both lungs may represent edema. 4. Chronic compression deformities of T7, T8, T11, T12, and upper lumbar spine. Electronically Signed   By: Narda Rutherford M.D.   On: 11/11/2022 15:25   ECHOCARDIOGRAM COMPLETE  Result Date: 11/11/2022     ECHOCARDIOGRAM REPORT   Patient Name:   Lindsay [name] Date of Exam: 11/11/2022 Medical Rec #:  [id number]  Height:       68.0 in Accession #:    [id number] Weight:       239.4 lb Date of Birth:  08-26-1940  BSA:          2.206 m Patient Age:    82 years   BP:           98/60 mmHg Patient Gender: F          HR:           85 bpm. Exam Location:  Inpatient Procedure: 2D Echo, Cardiac Doppler, Color Doppler and Intracardiac            Opacification Agent Indications:    Congestive Heart Failure I50.9                 Abnormal ECG R94.31                 Atrial Fibrillation I48.91  History:        Patient has prior history of Echocardiogram examinations, most                 recent 05/10/2021. CHF, COPD, Signs/Symptoms:Dyspnea; Risk                 Factors:Hypertension.  Sonographer:    Darlys Gales Referring Phys: [id number] DAVID MANUEL ORTIZ IMPRESSIONS  1. Left ventricular ejection fraction, by estimation, is 55 to 60%. The left ventricle has normal function. The left ventricle has no regional wall motion abnormalities. There is mild concentric left ventricular hypertrophy. Left ventricular diastolic parameters are indeterminate.  2. RV McConnell's Sign oted. Right ventricular systolic function is mildly reduced. The right ventricular size is severely enlarged.  3. Left atrial size was moderately dilated.  4. Right atrial size was moderately dilated.  5. A small pericardial effusion is present. The pericardial effusion is surrounding the apex.  6. The mitral valve was not well visualized. No evidence of mitral valve regurgitation. No evidence of mitral stenosis.  7. Tricuspid valve regurgitation is mild to moderate.  8. The aortic valve was not well visualized. There is mild calcification of the aortic valve. There is mild thickening of the aortic valve. Aortic valve regurgitation is not visualized. Comparison(s): Prior images reviewed side by side. RV not well visualized in prior study (basal function normal). FINDINGS  Left  Ventricle: Left ventricular ejection fraction, by estimation, is 55 to 60%. The left ventricle has  PROGRESS NOTE    Cera Pfost  UJW:[id number] DOB: [birth year] DOA: 11/10/2022 PCP: Tollie Eth, NP   Brief Narrative:  HPI per Dr. Sanda Klein   Lindsay [name] is a 82 y.o. female with medical history significant of diastolic CHF, pansinusitis, chronic respiratory failure on home oxygen, asthma, permanent atrial fibrillation, dysphagia, dyspnea, elevated troponin, hyperlipidemia, hypertension, hypokalemia, hyponatremia LGSIL, moderate persistent asthma has been having progressively worse dyspnea associated with wheezing, nonproductive cough and fatigue since Monday.  She stated that she was served a meal at the facility that felt salty to taste on Sunday or Monday.  No fever or chills.  No sore throat.  No travel history or sick contacts.  No fever, chills or night sweats. No sore throat, rhinorrhea, dyspnea, wheezing or hemoptysis.  No chest pain, palpitations, diaphoresis, PND, or orthopnea.  No abdominal pain, diarrhea, constipation, melena or hematochezia.  No flank pain, dysuria, frequency or hematuria.  No polyuria, polydipsia, polyphagia or blurred vision.   **Interim History   Continuing Diuresis and increased the dose to Lasix 40 mg po BID. Repeat CXR this AM done and showed "Patient positioning limits evaluation. Cardiac and mediastinal contours grossly are unchanged. Unchanged small bilateral pleural effusions and atelectasis. No visible pneumothorax."  Assessment and Plan:  Acute on chronic respiratory failure with hypoxia and hypercapnia (HCC) in the setting of Acute on Chronic Diastolic CHF (congestive heart failure) (HCC) and superimposed on Chronic obstructive pulmonary disease (HCC) -Changed to Inpatient Progressive -Continuous pulse oximetry maintain O2 saturation greater than 90% Continue supplemental oxygen via nasal cannula and wean to home regimen  -Continue with scheduled and as needed bronchodilators. -Strict I's and O's and daily weights with sodium and fluid restriction.   Intake/Output Summary (Last 24 hours) at 11/12/2022 2003 Last data filed at 11/12/2022 1726 Gross per 24 hour  Intake 840 ml  Output 1850 ml  Net -1010 ml  -Continue Furosemide but changed from 40 mg IV daily to twice daily -C/w Lisiniopril 5 mg po Daily and Currently Holding Metoprolol  -BNP was elevated at 360.2 -Repeat ECHOCardiogram was done and showed "Left ventricular ejection fraction, by estimation, is 55 to 60%. The left ventricle has normal function. The left ventricle has no regional wall motion abnormalities. There is mild concentric left ventricular hypertrophy. Left ventricular diastolic parameters are indeterminate.  RV McConnell's Sign oted. Right ventricular systolic function is mildly reduced. The right ventricular size is severely enlarged.  Left atrial size was moderately dilated. Right atrial size was moderately dilated. A small pericardial effusion is present. The pericardial effusion is surrounding the apex. The mitral valve was not well visualized. No evidence of mitral valve regurgitation. No evidence of mitral stenosis. Tricuspid valve regurgitation is mild to moderate. The aortic valve was not well visualized. There is mild calcification of the aortic valve. There is mild thickening of the aortic valve. Aortic valve regurgitation is not visualized. 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 Aortic atherosclerosis.  No acute aortic findings. Multi chamber cardiomegaly. Moderate right and small to moderate left pleural effusions with associated compressive atelectasis. Heterogeneous areas of ground-glass in both lungs may represent edema. Chronic compression deformities of T7, T8, T11, T12, and upper lumbar spine." -Repeat CXR this AM done and showed "Patient positioning limits  PROGRESS NOTE    Cera Pfost  UJW:[id number] DOB: [birth year] DOA: 11/10/2022 PCP: Tollie Eth, NP   Brief Narrative:  HPI per Dr. Sanda Klein   Lindsay [name] is a 82 y.o. female with medical history significant of diastolic CHF, pansinusitis, chronic respiratory failure on home oxygen, asthma, permanent atrial fibrillation, dysphagia, dyspnea, elevated troponin, hyperlipidemia, hypertension, hypokalemia, hyponatremia LGSIL, moderate persistent asthma has been having progressively worse dyspnea associated with wheezing, nonproductive cough and fatigue since Monday.  She stated that she was served a meal at the facility that felt salty to taste on Sunday or Monday.  No fever or chills.  No sore throat.  No travel history or sick contacts.  No fever, chills or night sweats. No sore throat, rhinorrhea, dyspnea, wheezing or hemoptysis.  No chest pain, palpitations, diaphoresis, PND, or orthopnea.  No abdominal pain, diarrhea, constipation, melena or hematochezia.  No flank pain, dysuria, frequency or hematuria.  No polyuria, polydipsia, polyphagia or blurred vision.   **Interim History   Continuing Diuresis and increased the dose to Lasix 40 mg po BID. Repeat CXR this AM done and showed "Patient positioning limits evaluation. Cardiac and mediastinal contours grossly are unchanged. Unchanged small bilateral pleural effusions and atelectasis. No visible pneumothorax."  Assessment and Plan:  Acute on chronic respiratory failure with hypoxia and hypercapnia (HCC) in the setting of Acute on Chronic Diastolic CHF (congestive heart failure) (HCC) and superimposed on Chronic obstructive pulmonary disease (HCC) -Changed to Inpatient Progressive -Continuous pulse oximetry maintain O2 saturation greater than 90% Continue supplemental oxygen via nasal cannula and wean to home regimen  -Continue with scheduled and as needed bronchodilators. -Strict I's and O's and daily weights with sodium and fluid restriction.   Intake/Output Summary (Last 24 hours) at 11/12/2022 2003 Last data filed at 11/12/2022 1726 Gross per 24 hour  Intake 840 ml  Output 1850 ml  Net -1010 ml  -Continue Furosemide but changed from 40 mg IV daily to twice daily -C/w Lisiniopril 5 mg po Daily and Currently Holding Metoprolol  -BNP was elevated at 360.2 -Repeat ECHOCardiogram was done and showed "Left ventricular ejection fraction, by estimation, is 55 to 60%. The left ventricle has normal function. The left ventricle has no regional wall motion abnormalities. There is mild concentric left ventricular hypertrophy. Left ventricular diastolic parameters are indeterminate.  RV McConnell's Sign oted. Right ventricular systolic function is mildly reduced. The right ventricular size is severely enlarged.  Left atrial size was moderately dilated. Right atrial size was moderately dilated. A small pericardial effusion is present. The pericardial effusion is surrounding the apex. The mitral valve was not well visualized. No evidence of mitral valve regurgitation. No evidence of mitral stenosis. Tricuspid valve regurgitation is mild to moderate. The aortic valve was not well visualized. There is mild calcification of the aortic valve. There is mild thickening of the aortic valve. Aortic valve regurgitation is not visualized. 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 Aortic atherosclerosis.  No acute aortic findings. Multi chamber cardiomegaly. Moderate right and small to moderate left pleural effusions with associated compressive atelectasis. Heterogeneous areas of ground-glass in both lungs may represent edema. Chronic compression deformities of T7, T8, T11, T12, and upper lumbar spine." -Repeat CXR this AM done and showed "Patient positioning limits  PROGRESS NOTE    Cera Pfost  UJW:[id number] DOB: [birth year] DOA: 11/10/2022 PCP: Tollie Eth, NP   Brief Narrative:  HPI per Dr. Sanda Klein   Lindsay [name] is a 82 y.o. female with medical history significant of diastolic CHF, pansinusitis, chronic respiratory failure on home oxygen, asthma, permanent atrial fibrillation, dysphagia, dyspnea, elevated troponin, hyperlipidemia, hypertension, hypokalemia, hyponatremia LGSIL, moderate persistent asthma has been having progressively worse dyspnea associated with wheezing, nonproductive cough and fatigue since Monday.  She stated that she was served a meal at the facility that felt salty to taste on Sunday or Monday.  No fever or chills.  No sore throat.  No travel history or sick contacts.  No fever, chills or night sweats. No sore throat, rhinorrhea, dyspnea, wheezing or hemoptysis.  No chest pain, palpitations, diaphoresis, PND, or orthopnea.  No abdominal pain, diarrhea, constipation, melena or hematochezia.  No flank pain, dysuria, frequency or hematuria.  No polyuria, polydipsia, polyphagia or blurred vision.   **Interim History   Continuing Diuresis and increased the dose to Lasix 40 mg po BID. Repeat CXR this AM done and showed "Patient positioning limits evaluation. Cardiac and mediastinal contours grossly are unchanged. Unchanged small bilateral pleural effusions and atelectasis. No visible pneumothorax."  Assessment and Plan:  Acute on chronic respiratory failure with hypoxia and hypercapnia (HCC) in the setting of Acute on Chronic Diastolic CHF (congestive heart failure) (HCC) and superimposed on Chronic obstructive pulmonary disease (HCC) -Changed to Inpatient Progressive -Continuous pulse oximetry maintain O2 saturation greater than 90% Continue supplemental oxygen via nasal cannula and wean to home regimen  -Continue with scheduled and as needed bronchodilators. -Strict I's and O's and daily weights with sodium and fluid restriction.   Intake/Output Summary (Last 24 hours) at 11/12/2022 2003 Last data filed at 11/12/2022 1726 Gross per 24 hour  Intake 840 ml  Output 1850 ml  Net -1010 ml  -Continue Furosemide but changed from 40 mg IV daily to twice daily -C/w Lisiniopril 5 mg po Daily and Currently Holding Metoprolol  -BNP was elevated at 360.2 -Repeat ECHOCardiogram was done and showed "Left ventricular ejection fraction, by estimation, is 55 to 60%. The left ventricle has normal function. The left ventricle has no regional wall motion abnormalities. There is mild concentric left ventricular hypertrophy. Left ventricular diastolic parameters are indeterminate.  RV McConnell's Sign oted. Right ventricular systolic function is mildly reduced. The right ventricular size is severely enlarged.  Left atrial size was moderately dilated. Right atrial size was moderately dilated. A small pericardial effusion is present. The pericardial effusion is surrounding the apex. The mitral valve was not well visualized. No evidence of mitral valve regurgitation. No evidence of mitral stenosis. Tricuspid valve regurgitation is mild to moderate. The aortic valve was not well visualized. There is mild calcification of the aortic valve. There is mild thickening of the aortic valve. Aortic valve regurgitation is not visualized. 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 Aortic atherosclerosis.  No acute aortic findings. Multi chamber cardiomegaly. Moderate right and small to moderate left pleural effusions with associated compressive atelectasis. Heterogeneous areas of ground-glass in both lungs may represent edema. Chronic compression deformities of T7, T8, T11, T12, and upper lumbar spine." -Repeat CXR this AM done and showed "Patient positioning limits  Lungs/Pleura: Moderate right and small to moderate left pleural effusion. There is associated compressive atelectasis. Heterogeneous areas of ground-glass in both lungs may represent edema. Upper Abdomen: Chronic splenic calcifications. No acute upper abdominal findings. Musculoskeletal: Chronic severe compression deformities of T7 and T8. Chronic moderate to severe compression deformities of T11 and T12. Chronic upper lumbar compression deformities. There are no acute or suspicious osseous abnormalities. Review of the MIP images confirms the above findings. IMPRESSION: 1. 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 2. Aortic atherosclerosis.  No acute aortic findings. 3. Multi chamber cardiomegaly. Moderate right and small to moderate left pleural effusions with associated compressive atelectasis. Heterogeneous areas of ground-glass in both lungs may represent edema. 4. Chronic compression deformities of T7, T8, T11, T12, and upper lumbar spine. Electronically Signed   By: Narda Rutherford M.D.   On: 11/11/2022 15:25   ECHOCARDIOGRAM COMPLETE  Result Date: 11/11/2022     ECHOCARDIOGRAM REPORT   Patient Name:   Lindsay [name] Date of Exam: 11/11/2022 Medical Rec #:  [id number]  Height:       68.0 in Accession #:    [id number] Weight:       239.4 lb Date of Birth:  08-26-1940  BSA:          2.206 m Patient Age:    82 years   BP:           98/60 mmHg Patient Gender: F          HR:           85 bpm. Exam Location:  Inpatient Procedure: 2D Echo, Cardiac Doppler, Color Doppler and Intracardiac            Opacification Agent Indications:    Congestive Heart Failure I50.9                 Abnormal ECG R94.31                 Atrial Fibrillation I48.91  History:        Patient has prior history of Echocardiogram examinations, most                 recent 05/10/2021. CHF, COPD, Signs/Symptoms:Dyspnea; Risk                 Factors:Hypertension.  Sonographer:    Darlys Gales Referring Phys: [id number] DAVID MANUEL ORTIZ IMPRESSIONS  1. Left ventricular ejection fraction, by estimation, is 55 to 60%. The left ventricle has normal function. The left ventricle has no regional wall motion abnormalities. There is mild concentric left ventricular hypertrophy. Left ventricular diastolic parameters are indeterminate.  2. RV McConnell's Sign oted. Right ventricular systolic function is mildly reduced. The right ventricular size is severely enlarged.  3. Left atrial size was moderately dilated.  4. Right atrial size was moderately dilated.  5. A small pericardial effusion is present. The pericardial effusion is surrounding the apex.  6. The mitral valve was not well visualized. No evidence of mitral valve regurgitation. No evidence of mitral stenosis.  7. Tricuspid valve regurgitation is mild to moderate.  8. The aortic valve was not well visualized. There is mild calcification of the aortic valve. There is mild thickening of the aortic valve. Aortic valve regurgitation is not visualized. Comparison(s): Prior images reviewed side by side. RV not well visualized in prior study (basal function normal). FINDINGS  Left  Ventricle: Left ventricular ejection fraction, by estimation, is 55 to 60%. The left ventricle has  PROGRESS NOTE    Cera Pfost  UJW:[id number] DOB: [birth year] DOA: 11/10/2022 PCP: Tollie Eth, NP   Brief Narrative:  HPI per Dr. Sanda Klein   Lindsay [name] is a 82 y.o. female with medical history significant of diastolic CHF, pansinusitis, chronic respiratory failure on home oxygen, asthma, permanent atrial fibrillation, dysphagia, dyspnea, elevated troponin, hyperlipidemia, hypertension, hypokalemia, hyponatremia LGSIL, moderate persistent asthma has been having progressively worse dyspnea associated with wheezing, nonproductive cough and fatigue since Monday.  She stated that she was served a meal at the facility that felt salty to taste on Sunday or Monday.  No fever or chills.  No sore throat.  No travel history or sick contacts.  No fever, chills or night sweats. No sore throat, rhinorrhea, dyspnea, wheezing or hemoptysis.  No chest pain, palpitations, diaphoresis, PND, or orthopnea.  No abdominal pain, diarrhea, constipation, melena or hematochezia.  No flank pain, dysuria, frequency or hematuria.  No polyuria, polydipsia, polyphagia or blurred vision.   **Interim History   Continuing Diuresis and increased the dose to Lasix 40 mg po BID. Repeat CXR this AM done and showed "Patient positioning limits evaluation. Cardiac and mediastinal contours grossly are unchanged. Unchanged small bilateral pleural effusions and atelectasis. No visible pneumothorax."  Assessment and Plan:  Acute on chronic respiratory failure with hypoxia and hypercapnia (HCC) in the setting of Acute on Chronic Diastolic CHF (congestive heart failure) (HCC) and superimposed on Chronic obstructive pulmonary disease (HCC) -Changed to Inpatient Progressive -Continuous pulse oximetry maintain O2 saturation greater than 90% Continue supplemental oxygen via nasal cannula and wean to home regimen  -Continue with scheduled and as needed bronchodilators. -Strict I's and O's and daily weights with sodium and fluid restriction.   Intake/Output Summary (Last 24 hours) at 11/12/2022 2003 Last data filed at 11/12/2022 1726 Gross per 24 hour  Intake 840 ml  Output 1850 ml  Net -1010 ml  -Continue Furosemide but changed from 40 mg IV daily to twice daily -C/w Lisiniopril 5 mg po Daily and Currently Holding Metoprolol  -BNP was elevated at 360.2 -Repeat ECHOCardiogram was done and showed "Left ventricular ejection fraction, by estimation, is 55 to 60%. The left ventricle has normal function. The left ventricle has no regional wall motion abnormalities. There is mild concentric left ventricular hypertrophy. Left ventricular diastolic parameters are indeterminate.  RV McConnell's Sign oted. Right ventricular systolic function is mildly reduced. The right ventricular size is severely enlarged.  Left atrial size was moderately dilated. Right atrial size was moderately dilated. A small pericardial effusion is present. The pericardial effusion is surrounding the apex. The mitral valve was not well visualized. No evidence of mitral valve regurgitation. No evidence of mitral stenosis. Tricuspid valve regurgitation is mild to moderate. The aortic valve was not well visualized. There is mild calcification of the aortic valve. There is mild thickening of the aortic valve. Aortic valve regurgitation is not visualized. 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 Aortic atherosclerosis.  No acute aortic findings. Multi chamber cardiomegaly. Moderate right and small to moderate left pleural effusions with associated compressive atelectasis. Heterogeneous areas of ground-glass in both lungs may represent edema. Chronic compression deformities of T7, T8, T11, T12, and upper lumbar spine." -Repeat CXR this AM done and showed "Patient positioning limits

## 2022-11-12 NOTE — Evaluation (Signed)
Occupational Therapy Evaluation Patient Details Name: Lindsay [name] MRN: [id number] DOB: [birth year] Today's Date: 11/12/2022   History of Present Illness Pt is an 82 year old woman admitted on 10/18 with shortness of breath and fatigue. + acute on chronic respiratory failure. PMH: COPD on 2-3L at baseline, CHF, HTN, HLD, CAD, PAF, asthma, hypothyroid, GAD.   Clinical Impression   Pt lives at [facility]. She walks with a rollator and is modified independent in self care and IADLs. Her meals and heavy cleaning are provided by the ILF. Pt reports residents just received call button pendants. She has been participating in HHPT to increase her endurance. Pt presents with generalized weakness and decreased activity tolerance. She progressed OOB with RW and supervision for lines and safety. Pt requires set up to supervision for ADLs. SpO2 92% on 6L O2. Will follow acutely. Recommending HHOT.       If plan is discharge home, recommend the following: A little help with bathing/dressing/bathroom;Assistance with cooking/housework;Assist for transportation;Help with stairs or ramp for entrance;A little help with walking and/or transfers    Functional Status Assessment  Patient has had a recent decline in their functional status and demonstrates the ability to make significant improvements in function in a reasonable and predictable amount of time.  Equipment Recommendations  None recommended by OT    Recommendations for Other Services       Precautions / Restrictions Precautions Precautions: Fall Precaution Comments: watch sats and BP Restrictions Weight Bearing Restrictions: No      Mobility Bed Mobility Overal bed mobility: Needs Assistance Bed Mobility: Supine to Sit     Supine to sit: Supervision, HOB elevated     General bed mobility comments: supervision for lines    Transfers Overall transfer level: Needs assistance Equipment used: Rolling walker (2  wheels) Transfers: Sit to/from Stand Sit to Stand: Supervision           General transfer comment: good technique, cues to slow pace due to low BP      Balance Overall balance assessment: Needs assistance   Sitting balance-Leahy Scale: Good       Standing balance-Leahy Scale: Poor Standing balance comment: reliant on RW                           ADL either performed or assessed with clinical judgement   ADL Overall ADL's : Needs assistance/impaired Eating/Feeding: Independent;Sitting   Grooming: Set up;Wash/dry hands;Wash/dry face;Sitting   Upper Body Bathing: Set up;Sitting   Lower Body Bathing: Supervison/ safety;Sit to/from stand   Upper Body Dressing : Set up;Sitting   Lower Body Dressing: Supervision/safety;Sit to/from stand   Toilet Transfer: Supervision/safety;Stand-pivot;Rolling walker (2 wheels)   Toileting- Architect and Hygiene: Supervision/safety;Sit to/from stand       Functional mobility during ADLs: Supervision/safety;Rolling walker (2 wheels)       Vision Baseline Vision/History: 1 Wears glasses Ability to See in Adequate Light: 0 Adequate Patient Visual Report: No change from baseline       Perception         Praxis         Pertinent Vitals/Pain Pain Assessment Pain Assessment: No/denies pain     Extremity/Trunk Assessment Upper Extremity Assessment Upper Extremity Assessment: Generalized weakness (limited shoulders)   Lower Extremity Assessment Lower Extremity Assessment: Defer to PT evaluation   Cervical / Trunk Assessment Cervical / Trunk Assessment: Kyphotic   Communication Communication Communication: Hearing impairment   Cognition Arousal:  Alert Behavior During Therapy: WFL for tasks assessed/performed Overall Cognitive Status: Within Functional Limits for tasks assessed                                       General Comments       Exercises     Shoulder Instructions       Home Living Family/patient expects to be discharged to:: Private residence Living Arrangements: Alone   Type of Home: Independent living facility [facility]) Home Access: Level entry     Home Layout: One level     Bathroom Shower/Tub: Producer, television/film/video: Standard     Home Equipment: Rollator (4 wheels);BSC/3in1;Shower seat;Hand held shower head;Grab bars - toilet;Grab bars - tub/shower;Other (comment) (O2)   Additional Comments: keeps 3 in 1 over her toilet      Prior Functioning/Environment Prior Level of Function : Independent/Modified Independent             Mobility Comments: walks with a rollator ADLs Comments: sits to shower, struggles with laundry, meals and heavy housekeeping provided by ILF        OT Problem List: Decreased strength;Decreased activity tolerance;Impaired balance (sitting and/or standing);Decreased coordination;Obesity;Cardiopulmonary status limiting activity      OT Treatment/Interventions: Self-care/ADL training;Energy conservation;Therapeutic activities;Patient/family education;Balance training    OT Goals(Current goals can be found in the care plan section) Acute Rehab OT Goals OT Goal Formulation: With patient Time For Goal Achievement: 11/26/22 Potential to Achieve Goals: Good  OT Frequency: Min 1X/week    Co-evaluation              AM-PAC OT "6 Clicks" Daily Activity     Outcome Measure Help from another person eating meals?: None Help from another person taking care of personal grooming?: A Little Help from another person toileting, which includes using toliet, bedpan, or urinal?: A Little Help from another person bathing (including washing, rinsing, drying)?: A Little Help from another person to put on and taking off regular upper body clothing?: A Little Help from another person to put on and taking off regular lower body clothing?: A Little 6 Click Score: 19   End of Session Equipment Utilized  During Treatment: Gait belt;Rolling walker (2 wheels);Oxygen (6L) Nurse Communication: Mobility status;Other (comment) (no symptoms of hypotension)  Activity Tolerance: Patient tolerated treatment well Patient left: in chair;with call bell/phone within reach;Other (comment) (RT in room)  OT Visit Diagnosis: Unsteadiness on feet (R26.81);Other abnormalities of gait and mobility (R26.89);Muscle weakness (generalized) (M62.81);Other (comment) (decreased activity tolerance)                Time: 4132-4401 OT Time Calculation (min): 22 min Charges:  OT General Charges $OT Visit: 1 Visit OT Evaluation $OT Eval Moderate Complexity: 1 Mod  Berna Spare, OTR/L Acute Rehabilitation Services Office: [phone]  Evern Bio 11/12/2022, 11:55 AM

## 2022-11-13 ENCOUNTER — Inpatient Hospital Stay (HOSPITAL_COMMUNITY): Payer: Medicare HMO

## 2022-11-13 ENCOUNTER — Encounter (HOSPITAL_COMMUNITY): Payer: Self-pay

## 2022-11-13 ENCOUNTER — Ambulatory Visit (HOSPITAL_COMMUNITY): Payer: Medicare HMO

## 2022-11-13 DIAGNOSIS — E1159 Type 2 diabetes mellitus with other circulatory complications: Secondary | ICD-10-CM | POA: Diagnosis not present

## 2022-11-13 DIAGNOSIS — K219 Gastro-esophageal reflux disease without esophagitis: Secondary | ICD-10-CM | POA: Diagnosis not present

## 2022-11-13 DIAGNOSIS — J9621 Acute and chronic respiratory failure with hypoxia: Secondary | ICD-10-CM | POA: Diagnosis not present

## 2022-11-13 DIAGNOSIS — E1169 Type 2 diabetes mellitus with other specified complication: Secondary | ICD-10-CM | POA: Diagnosis not present

## 2022-11-13 LAB — COMPREHENSIVE METABOLIC PANEL
ALT: 18 U/L (ref 0–44)
AST: 17 U/L (ref 15–41)
Albumin: 3.4 g/dL — ABNORMAL LOW (ref 3.5–5.0)
Alkaline Phosphatase: 69 U/L (ref 38–126)
Anion gap: 10 (ref 5–15)
BUN: 32 mg/dL — ABNORMAL HIGH (ref 8–23)
CO2: 33 mmol/L — ABNORMAL HIGH (ref 22–32)
Calcium: 8.6 mg/dL — ABNORMAL LOW (ref 8.9–10.3)
Chloride: 96 mmol/L — ABNORMAL LOW (ref 98–111)
Creatinine, Ser: 1.09 mg/dL — ABNORMAL HIGH (ref 0.44–1.00)
GFR, Estimated: 51 mL/min — ABNORMAL LOW (ref >60)
Glucose, Bld: 104 mg/dL — ABNORMAL HIGH (ref 70–99)
Potassium: 3.8 mmol/L (ref 3.5–5.1)
Sodium: 139 mmol/L (ref 135–145)
Total Bilirubin: 0.7 mg/dL (ref 0.3–1.2)
Total Protein: 6.7 g/dL (ref 6.5–8.1)

## 2022-11-13 LAB — CBC WITH DIFFERENTIAL/PLATELET
Abs Immature Granulocytes: 0.05 10*3/uL (ref 0.00–0.07)
Basophils Absolute: 0 10*3/uL (ref 0.0–0.1)
Basophils Relative: 0 %
Eosinophils Absolute: 0 10*3/uL (ref 0.0–0.5)
Eosinophils Relative: 0 %
HCT: 37.5 % (ref 36.0–46.0)
Hemoglobin: 11.5 g/dL — ABNORMAL LOW (ref 12.0–15.0)
Immature Granulocytes: 1 %
Lymphocytes Relative: 8 %
Lymphs Abs: 0.8 10*3/uL (ref 0.7–4.0)
MCH: 28.8 pg (ref 26.0–34.0)
MCHC: 30.7 g/dL (ref 30.0–36.0)
MCV: 94 fL (ref 80.0–100.0)
Monocytes Absolute: 0.8 10*3/uL (ref 0.1–1.0)
Monocytes Relative: 8 %
Neutro Abs: 8 10*3/uL — ABNORMAL HIGH (ref 1.7–7.7)
Neutrophils Relative %: 83 %
Platelets: 238 10*3/uL (ref 150–400)
RBC: 3.99 MIL/uL (ref 3.87–5.11)
RDW: 16.1 % — ABNORMAL HIGH (ref 11.5–15.5)
WBC: 9.7 10*3/uL (ref 4.0–10.5)
nRBC: 0 % (ref 0.0–0.2)

## 2022-11-13 LAB — PHOSPHORUS: Phosphorus: 4.5 mg/dL (ref 2.5–4.6)

## 2022-11-13 LAB — GLUCOSE, CAPILLARY
Glucose-Capillary: 107 mg/dL — ABNORMAL HIGH (ref 70–99)
Glucose-Capillary: 114 mg/dL — ABNORMAL HIGH (ref 70–99)
Glucose-Capillary: 126 mg/dL — ABNORMAL HIGH (ref 70–99)
Glucose-Capillary: 130 mg/dL — ABNORMAL HIGH (ref 70–99)

## 2022-11-13 LAB — MAGNESIUM: Magnesium: 2.4 mg/dL (ref 1.7–2.4)

## 2022-11-13 MED ORDER — IPRATROPIUM-ALBUTEROL 0.5-2.5 (3) MG/3ML IN SOLN
3.0000 mL | Freq: Three times a day (TID) | RESPIRATORY_TRACT | Status: DC
  Administered 2022-11-13 – 2022-11-14 (×4): 3 mL via RESPIRATORY_TRACT
  Filled 2022-11-13 (×4): qty 3

## 2022-11-13 NOTE — TOC Initial Note (Signed)
Transition of Care [facility]) - Initial/Assessment Note    Patient Details  Name: Lindsay [name] MRN: [id number] Date of Birth: 1940/03/16  [facility]) CM/SW Contact:    Lanier Clam, RN Phone Number: 11/13/2022, 1:42 PM  Clinical Narrative: From [facility] living.[facility] for HHPT/OT-faxed orders with confirmation. Already has home 02. [facility] program referal called for [facility] to contact the dtr [facility]. Has own transport home.                   Expected Discharge Plan: Home w Home Health Services Barriers to Discharge: Continued Medical Work up   Patient Goals and CMS Choice     Choice offered to / list presented to : Patient [place] ownership interest in [facility].provided to:: Patient    Expected Discharge Plan and Services   Discharge Planning Services: CM Consult Post Acute Care Choice: Home Health Living arrangements for the past 2 months: Independent Living Facility                           HH Arranged: PT, OT HH Agency: Other - See comment International aid/development worker with [facility] living)        Prior Living Arrangements/Services Living arrangements for the past 2 months: Independent Living Facility Lives with:: Self Patient language and need for interpreter reviewed:: Yes Do you feel safe going back to the place where you live?: Yes      Need for Family Participation in Patient Care: Yes (Comment) Care giver support system in place?: Yes (comment) Current home services: DME ([facility] home 02,rw) Criminal Activity/Legal Involvement Pertinent to Current Situation/Hospitalization: No - Comment as needed  Activities of Daily Living   ADL Screening (condition at time of admission) Independently performs ADLs?: Yes (appropriate for developmental age) Is the patient deaf or have difficulty hearing?: No Does the patient have difficulty seeing, even when wearing glasses/contacts?: No Does the patient have difficulty concentrating,  remembering, or making decisions?: No  Permission Sought/Granted Permission sought to share information with : Case Manager Permission granted to share information with : Yes, Verbal Permission Granted  Share Information with NAME: Case Manager           Emotional Assessment Appearance:: Appears stated age Attitude/Demeanor/Rapport: Gracious Affect (typically observed): Accepting Orientation: : Oriented to Self, Oriented to Place, Oriented to  Time, Oriented to Situation Alcohol / Substance Use: Not Applicable Psych Involvement: No (comment)  Admission diagnosis:  Shortness of breath [R06.02] Hypoxia [R09.02] COPD exacerbation (HCC) [J44.1] Acute on chronic respiratory failure with hypoxia and hypercapnia (HCC) [L87.56, J96.22] Acute on chronic congestive heart failure, unspecified heart failure type (HCC) [I50.9] Patient Active Problem List   Diagnosis Date Noted   Acute on chronic respiratory failure with hypoxia and hypercapnia (HCC) 11/10/2022   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   Abdominal aortic ectasia (HCC) 09/18/2022   Atherosclerosis of native arteries of extremities with rest pain, bilateral legs (HCC) 09/18/2022   Hypothyroidism 09/18/2022   Insomnia 09/18/2022   Non-toxic multinodular goiter 09/18/2022   Occlusion and stenosis of bilateral carotid arteries 09/18/2022   Other vitamin B12 deficiency anemias 09/18/2022   Unspecified mononeuropathy of right lower limb 09/18/2022   Non-healing wound of right lower extremity 08/01/2022   Presbycusis of both ears 07/06/2022   Sebaceous cyst 05/26/2022   Bilateral hearing loss 05/11/2022   Hypertension associated with diabetes (HCC) 05/11/2021   GERD (gastroesophageal reflux disease) 05/09/2021   Hyperlipidemia associated with type  2 diabetes mellitus (HCC) 05/09/2021   Bilateral lower extremity edema 03/09/2021   Physical deconditioning 02/18/2021   GAD (generalized anxiety disorder) 10/21/2020   Urinary  incontinence in female 09/28/2020   Chronic CHF (congestive heart failure) (HCC) 09/14/2020   Achalasia of esophagus 08/06/2020   Acute on chronic diastolic CHF (congestive heart failure) (HCC) 08/03/2020   Chronic respiratory failure with hypoxia (HCC) 08/02/2020   Hypokalemia 08/02/2020   Chronic anticoagulation 08/02/2020   Acquired thrombophilia (HCC) 08/02/2020   Persistent atrial fibrillation (HCC) 08/02/2020   Age-related osteoporosis without current pathological fracture 01/15/2020   History of compression fracture of spine 12/02/2019   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   Morbid obesity with body mass index (BMI) of 40.0 or higher (HCC) 12/02/2019   Oxygen dependent 12/02/2019   Recurrent major depressive disorder, in partial remission (HCC) 12/02/2019   Vitamin D deficiency 12/02/2019   Second degree atrioventricular block 03/17/2014   Chronic obstructive pulmonary disease (HCC) 11/11/2012   Mitral valve stenosis 06/13/2004   PCP:  Tollie Eth, NP Pharmacy:   [facility] [place], [place] - [address] [place] [address] Phone: [phone] Fax: [phone]     Social Determinants of Health (SDOH) Social History: SDOH Screenings   Food Insecurity: No Food Insecurity (11/10/2022)  Housing: Low Risk  (11/10/2022)  Transportation Needs: Unmet Transportation Needs (11/10/2022)  Utilities: Not At Risk (11/10/2022)  Depression (PHQ2-9): Low Risk  (07/11/2022)  Financial Resource Strain: Low Risk  (07/10/2022)  Physical Activity: Inactive (07/10/2022)  Social Connections: Unknown (07/10/2022)  Stress: Stress Concern Present (07/10/2022)  Tobacco Use: Medium Risk (11/10/2022)   SDOH Interventions:     Readmission Risk Interventions    05/12/2021    2:16 PM  Readmission Risk Prevention Plan  Transportation Screening Complete  PCP or Specialist Appt within 3-5 Days Complete  HRI or Home Care  Consult Complete  Social Work Consult for Recovery Care Planning/Counseling Complete  Palliative Care Screening Not Applicable  Medication Review Oceanographer) Complete

## 2022-11-13 NOTE — Telephone Encounter (Signed)
Fyi and ok to cancel current CT?

## 2022-11-13 NOTE — Progress Notes (Signed)
Mobility Specialist - Progress Note   11/13/22 1515  Mobility  Activity Transferred to/from [facility]  Level of Assistance Standby assist, set-up cues, supervision of patient - no hands on  Assistive Device Front wheel walker  Distance Ambulated (ft) 40 ft  Range of Motion/Exercises Active  Activity Response Tolerated fair  Mobility Referral Yes  $Mobility charge 1 Mobility  Mobility Specialist Start Time (ACUTE ONLY) 1500  Mobility Specialist Stop Time (ACUTE ONLY) 1515  Mobility Specialist Time Calculation (min) (ACUTE ONLY) 15 min   Pt was found in bed and agreeable to ambulate after BSC. Pt grew SOB with session. Unable to get a valid SPO2 reading. HR was 119 bpm upon returning to room. At EOS was left in bed with all needs met. Call bell in reach and bed alarm on.  Billey Chang Mobility Specialist

## 2022-11-13 NOTE — Progress Notes (Signed)
   11/13/22 2205  BiPAP/CPAP/SIPAP  Reason BIPAP/CPAP not in use Non-compliant  BiPAP/CPAP /SiPAP Vitals  Bilateral Breath Sounds Diminished  MEWS Score/Color  MEWS Score 0  MEWS Score Color Green   Pt. Decline CPAP/BiPAP placement, does not use one, declined placement at this time.

## 2022-11-13 NOTE — Progress Notes (Signed)
PROGRESS NOTE    Lindsay [name]  NUU:[id number] DOB: 27-Aug-1940 DOA: 11/10/2022 PCP: Tollie Eth, NP   Brief Narrative:  HPI per Dr. Sanda Klein   Lindsay [name] is a 82 y.o. female with medical history significant of diastolic CHF, pansinusitis, chronic respiratory failure on home oxygen, asthma, permanent atrial fibrillation, dysphagia, dyspnea, elevated troponin, hyperlipidemia, hypertension, hypokalemia, hyponatremia LGSIL, moderate persistent asthma has been having progressively worse dyspnea associated with wheezing, nonproductive cough and fatigue since Monday.  She stated that she was served a meal at the facility that felt salty to taste on Sunday or Monday.  No fever or chills.  No sore throat.  No travel history or sick contacts.  No fever, chills or night sweats. No sore throat, rhinorrhea, dyspnea, wheezing or hemoptysis.  No chest pain, palpitations, diaphoresis, PND, or orthopnea.  No abdominal pain, diarrhea, constipation, melena or hematochezia.  No flank pain, dysuria, frequency or hematuria.  No polyuria, polydipsia, polyphagia or blurred vision.   **Interim History   Continuing Diuresis and increased the dose to Lasix 40 mg po BID. Repeat CXR this AM done and showed "Patient positioning limits evaluation. Cardiac and mediastinal contours grossly are unchanged. Unchanged small bilateral pleural effusions and atelectasis. No visible pneumothorax."  Assessment and Plan:  Acute on chronic respiratory failure with hypoxia and hypercapnia (HCC) in the setting of Acute on Chronic Diastolic CHF (congestive heart failure) (HCC) and superimposed on Chronic obstructive pulmonary disease (HCC) -Changed to Inpatient Progressive -Continuous pulse oximetry maintain O2 saturation greater than 90% Continue supplemental oxygen via nasal cannula and wean to home regimen  -Continue with scheduled and as needed bronchodilators. -Strict I's and O's and daily weights with sodium and fluid restriction.   Intake/Output Summary (Last 24 hours) at 11/12/2022 2003 Last data filed at 11/12/2022 1726 Gross per 24 hour  Intake 840 ml  Output 1850 ml  Net -1010 ml  -Continue Furosemide but changed from 40 mg IV daily to twice daily -C/w Lisiniopril 5 mg po Daily and Currently Holding Metoprolol  -BNP was elevated at 360.2 -Repeat ECHOCardiogram was done and showed "Left ventricular ejection fraction, by estimation, is 55 to 60%. The left ventricle has normal function. The left ventricle has no regional wall motion abnormalities. There is mild concentric left ventricular hypertrophy. Left ventricular diastolic parameters are indeterminate.  RV McConnell's Sign oted. Right ventricular systolic function is mildly reduced. The right ventricular size is severely enlarged.  Left atrial size was moderately dilated. Right atrial size was moderately dilated. A small pericardial effusion is present. The pericardial effusion is surrounding the apex. The mitral valve was not well visualized. No evidence of mitral valve regurgitation. No evidence of mitral stenosis. Tricuspid valve regurgitation is mild to moderate. The aortic valve was not well visualized. There is mild calcification of the aortic valve. There is mild thickening of the aortic valve. Aortic valve regurgitation is not visualized. 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 Aortic atherosclerosis.  No acute aortic findings. Multi chamber cardiomegaly. Moderate right and small to moderate left pleural effusions with associated compressive atelectasis. Heterogeneous areas of ground-glass in both lungs may represent edema. Chronic compression deformities of T7, T8, T11, T12, and upper lumbar spine." -Repeat CXR this AM done and showed "Patient positioning limits  evaluation. Cardiac and mediastinal contours grossly are unchanged. Unchanged small bilateral pleural effusions and atelectasis. No visible pneumothorax." -Continue to monitor for signs and symptoms of volume overload and repeat chest x-ray in a.m.   Hypertension associated with diabetes (HCC) but having softer BP -Holding metoprolol. -Started low-dose Lisinopril but will hold given lower BP -Continue furosemide 40 mg IV BID -Continue to Monitor BP per Protocol -Last BP Reading was on the softer side at 90/55   Hyperlipidemia associated with type 2 diabetes mellitus (HCC) -Continue Atorvastatin 40 mg p.o. daily.    Persistent Atrial Fibrillation (HCC) -Continue Apixaban 5 mg p.o. twice daily. -Had resumed Metoprolol but will hold given Softer BP -Continue to Monitor on Telemetry   Depression  GAD (generalized anxiety disorder) -Continue Lorazepam 1 mg p.o. twice daily as needed. -C/w Sertraline 100 mg po Daily    Hypothyroidism -Checked TSH level and was 0.370 -Not on Levothyroxine.   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 -Obtained while in the hospital and showed "Mild dilatation of the aortic arch a 4 cm, stable"   Normocytic Anemia -Hgb/Hct Trend: Recent Labs  Lab 11/10/22 0827 11/10/22 0913 11/11/22 0542 11/12/22 0503  HGB 11.4* 11.9* 12.5 11.7*  HCT 36.2 35.0* 40.7 38.6  MCV 92.6  --  95.1 95.5  -Anemia panel done and showed an iron level of 45, UIBC of 264, TIBC of 409, saturation ratios of 11%, ferritin level 27, follow-up 6.4 and vitamin B12 163 -Will start vitamin B12 supplementation and give her a dose of IM B12 -Continue to Monitor for S/Sx of Bleeding; No overt bleeding noted -Repeat CBC in the AM   GERD/GI Prophylaxis -C/w Prednisone 20 mg po Daily   Obesity -Complicates overall prognosis and care -Estimated body mass index is 36.64 kg/m as calculated from the following:   Height as of this encounter: 5\' 8"  (1.727 m).    Weight as of this encounter: 109.3 kg.  -Weight Loss and Dietary Counseling given  DVT prophylaxis:  apixaban (ELIQUIS) tablet 5 mg    Code Status: Full Code Family Communication: No family at bedside  Disposition Plan:  Level of care: Progressive Status is: Inpatient Remains inpatient appropriate because: Needs further clinical improvement and will need an Ambulatory Home O2 Screen   Consultants:  None  Procedures:  As delineated as above  Antimicrobials:  Anti-infectives (From admission, onward)    None       Subjective: Seen and examined at bedside and thinks she is doing about 50% better since coming.  Breathing is little bit better and able to bring up some more of her sputum.  No nausea or vomiting.  Still feels short of breath but not as much.  No other concerns or complaints this time and asking if she can go home.  Objective: Vitals:   11/13/22 0801 11/13/22 0940 11/13/22 0955 11/13/22 1145  BP:  105/80  106/69  Pulse:    (!) 58  Resp:   (!) 22 18  Temp:    98 F (36.7 C)  TempSrc:    Oral  SpO2: 94% 95%  93%  Weight:      Height:        Intake/Output Summary (Last 24 hours) at 11/13/2022 1937 Last data filed at 11/13/2022 1700 Gross per 24 hour  Intake 960 ml  Output 1850 ml  Net -890 ml   Filed Weights   11/11/22 0500 11/12/22 0529 11/13/22 0510  Weight: 108.6 kg 109.3 kg 109 kg  Examination: Physical Exam:  Constitutional: WN/WD obese elderly Caucasian female sitting in the chair appears okay and not as tachypneic Respiratory: Diminished to auscultation bilaterally with coarse breath sounds and has some slight crackles and some mild rhonchi but no appreciable wheezing or rales. Normal respiratory effort and patient is not tachypenic. No accessory muscle use.  Wearing supplemental oxygen nasal cannula Cardiovascular: RRR, no murmurs / rubs / gallops. S1 and S2 auscultated.  Mild extremity edema.  Abdomen: Soft, non-tender, distended secondary to  body habitus. Bowel sounds positive.  GU: Deferred. Musculoskeletal: No clubbing / cyanosis of digits/nails. No joint deformity upper and lower extremities.  Skin: No rashes, lesions, ulcers on limited skin evaluation. No induration; Warm and dry.  Neurologic: CN 2-12 grossly intact with no focal deficits. Romberg sign and cerebellar reflexes not assessed.  Psychiatric: Normal judgment and insight. Alert and oriented x 3. Normal mood and appropriate affect.   Data Reviewed: I have personally reviewed following labs and imaging studies  CBC: Recent Labs  Lab 11/10/22 0827 11/10/22 0913 11/11/22 0542 11/12/22 0503 11/13/22 0452  WBC 9.4  --  11.2* 9.9 9.7  NEUTROABS 8.4*  --   --  8.4* 8.0*  HGB 11.4* 11.9* 12.5 11.7* 11.5*  HCT 36.2 35.0* 40.7 38.6 37.5  MCV 92.6  --  95.1 95.5 94.0  PLT 247  --  224 257 238   Basic Metabolic Panel: Recent Labs  Lab 11/10/22 0827 11/10/22 0913 11/11/22 0542 11/12/22 0503 11/13/22 0452  NA 140 139 143 143 139  K 5.0 4.4 4.2 3.9 3.8  CL 105  --  100 101 96*  CO2 30  --  33* 33* 33*  GLUCOSE 152*  --  119* 92 104*  BUN 11  --  17 25* 32*  CREATININE 0.80  --  0.81 0.95 1.09*  CALCIUM 8.9  --  9.0 9.0 8.6*  MG  --   --   --  2.5* 2.4  PHOS  --   --   --  4.6 4.5   GFR: Estimated Creatinine Clearance: 51.4 mL/min (A) (by C-G formula based on SCr of 1.09 mg/dL (H)). Liver Function Tests: Recent Labs  Lab 11/11/22 0542 11/12/22 0503 11/13/22 0452  AST 17 17 17   ALT 20 19 18   ALKPHOS 77 71 69  BILITOT 0.7 0.8 0.7  PROT 7.1 7.1 6.7  ALBUMIN 3.5 3.5 3.4*   No results for input(s): "LIPASE", "AMYLASE" in the last 168 hours. No results for input(s): "AMMONIA" in the last 168 hours. Coagulation Profile: No results for input(s): "INR", "PROTIME" in the last 168 hours. Cardiac Enzymes: No results for input(s): "CKTOTAL", "CKMB", "CKMBINDEX", "TROPONINI" in the last 168 hours. BNP (last 3 results) No results for input(s): "PROBNP" in  the last 8760 hours. HbA1C: No results for input(s): "HGBA1C" in the last 72 hours. CBG: Recent Labs  Lab 11/12/22 1648 11/12/22 2056 11/13/22 0723 11/13/22 1143 11/13/22 1648  GLUCAP 164* 120* 107* 114* 126*   Lipid Profile: No results for input(s): "CHOL", "HDL", "LDLCALC", "TRIG", "CHOLHDL", "LDLDIRECT" in the last 72 hours. Thyroid Function Tests: Recent Labs    11/11/22 0542  TSH 0.370   Anemia Panel: Recent Labs    11/12/22 0503 11/12/22 0504  VITAMINB12 163*  --   FOLATE 6.4  --   FERRITIN 27  --   TIBC 409  --   IRON 45  --   RETICCTPCT  --  2.5   Sepsis Labs: No results for input(s): "  PROCALCITON", "LATICACIDVEN" in the last 168 hours.  Recent Results (from the past 240 hour(s))  SARS Coronavirus 2 by RT PCR (hospital order, performed in [facility] hospital lab) *cepheid single result test* Anterior Nasal Swab     Status: None   Collection Time: 11/10/22  8:22 AM   Specimen: Anterior Nasal Swab  Result Value Ref Range Status   SARS Coronavirus 2 by RT PCR NEGATIVE NEGATIVE Final    Comment: (NOTE) SARS-CoV-2 target nucleic acids are NOT DETECTED.  The SARS-CoV-2 RNA is generally detectable in upper and lower respiratory specimens during the acute phase of infection. The lowest concentration of SARS-CoV-2 viral copies this assay can detect is 250 copies / mL. A negative result does not preclude SARS-CoV-2 infection and should not be used as the sole basis for treatment or other patient management decisions.  A negative result may occur with improper specimen collection / handling, submission of specimen other than nasopharyngeal swab, presence of viral mutation(s) within the areas targeted by this assay, and inadequate number of viral copies (<250 copies / mL). A negative result must be combined with clinical observations, patient history, and epidemiological information.  Fact Sheet for Patients:   RoadLapTop.co.za  Fact  Sheet for Healthcare Providers: http://kim-miller.com/  This test is not yet approved or  cleared by the [place] FDA and has been authorized for detection and/or diagnosis of SARS-CoV-2 by FDA under an Emergency Use Authorization (EUA).  This EUA will remain in effect (meaning this test can be used) for the duration of the COVID-19 declaration under Section 564(b)(1) of the Act, 21 U.S.C. section 360bbb-3(b)(1), unless the authorization is terminated or revoked sooner.  Performed at [facility], [address], [place], [place] [address]      Radiology Studies: DG CHEST PORT 1 VIEW  Result Date: 11/13/2022 CLINICAL DATA:  Shortness of breath. EXAM: PORTABLE CHEST 1 VIEW COMPARISON:  November 12, 2022. FINDINGS: Stable cardiomegaly. Minimal bibasilar subsegmental atelectasis is noted with small pleural effusions. Bony thorax is unremarkable. IMPRESSION: Minimal bibasilar subsegmental atelectasis with small pleural effusions. Electronically Signed   By: Lupita Raider M.D.   On: 11/13/2022 10:33   DG CHEST PORT 1 VIEW  Result Date: 11/12/2022 CLINICAL DATA:  Shortness of breath EXAM: PORTABLE CHEST 1 VIEW COMPARISON:  Chest x-ray dated November 10, 2022 FINDINGS: Patient positioning limits evaluation. Cardiac and mediastinal contours grossly are unchanged. Unchanged small bilateral pleural effusions and atelectasis. No visible pneumothorax. IMPRESSION: Unchanged small bilateral pleural effusions and atelectasis. Electronically Signed   By: Allegra Lai M.D.   On: 11/12/2022 10:16    Scheduled Meds:  apixaban  5 mg Oral BID   atorvastatin  40 mg Oral Daily   vitamin B-12  1,000 mcg Oral Daily   guaiFENesin  1,200 mg Oral BID   insulin aspart  0-20 Units Subcutaneous TID WC   ipratropium-albuterol  3 mL Nebulization TID   loratadine  10 mg Oral QHS   mirtazapine  45 mg Oral QHS   pantoprazole  20 mg Oral QHS   predniSONE  40 mg Oral Q  breakfast   sertraline  100 mg Oral QHS   Continuous Infusions:   LOS: 3 days   Marguerita Merles, DO [facility] Available via Epic secure chat 7am-7pm After these hours, please refer to coverage provider listed on [email or web address] 11/13/2022, 7:37 PM

## 2022-11-13 NOTE — Evaluation (Signed)
Physical Therapy Evaluation Patient Details Name: Lindsay [name] MRN: [id number] DOB: 1940-05-06 Today's Date: 11/13/2022  History of Present Illness  Pt is an 82 year old woman admitted on 10/18 with shortness of breath and fatigue. + acute on chronic respiratory failure. PMH: COPD on 2-3L at baseline, CHF, HTN, HLD, CAD, PAF, asthma, hypothyroid, GAD.  Clinical Impression  Pt admitted with above diagnosis. Pt ind with self care and facility ambulation using rollator at baseline at [facility]. Facility provides cooking and cleaning assistance. Pt currently reports weakness, needing increased time to complete transfers, declines ambulation due to weakness complaints. Pt tolerates seated exercises, encouraged to increase time OOB, amb to restroom with nursing as able and pt in agreement. Pt on 4L O2 with SpO2 92-96%, audible wheezing noted, encouraged pursed lip breathing and seated rest breaks as appropriate. Recommend HHPT at d/c. Pt currently with functional limitations due to the deficits listed below (see PT Problem List). Pt will benefit from acute skilled PT to increase their independence and safety with mobility to allow discharge.           If plan is discharge home, recommend the following: Assistance with cooking/housework;Assist for transportation   Can travel by private vehicle        Equipment Recommendations None recommended by PT  Recommendations for Other Services       Functional Status Assessment Patient has had a recent decline in their functional status and demonstrates the ability to make significant improvements in function in a reasonable and predictable amount of time.     Precautions / Restrictions        Mobility  Bed Mobility Overal bed mobility: Needs Assistance Bed Mobility: Supine to Sit     Supine to sit: Supervision, HOB elevated, Used rails     General bed mobility comments: supv, pt using elevated HOB and bedrails to upright trunk and  scoot out to EOB, therapist managing lines for safety    Transfers Overall transfer level: Needs assistance Equipment used: Rolling walker (2 wheels) Transfers: Sit to/from Stand, Bed to chair/wheelchair/BSC Sit to Stand: Supervision   Step pivot transfers: Supervision       General transfer comment: step pivot with RW, pt maintains trunk slightly flexed, good awareness, declines amb due to SOB and "weak" complaints    Ambulation/Gait                  Stairs            Wheelchair Mobility     Tilt Bed    Modified Rankin (Stroke Patients Only)       Balance Overall balance assessment: Mild deficits observed, not formally tested   Sitting balance-Leahy Scale: Good       Standing balance-Leahy Scale: Poor Standing balance comment: reliant on RW                             Pertinent Vitals/Pain Pain Assessment Pain Assessment: No/denies pain    Home Living Family/patient expects to be discharged to:: Private residence Living Arrangements: Alone Available Help at Discharge: Family;Friend(s);Available PRN/intermittently Type of Home: Independent living facility [facility]) Home Access: Licensed conveyancer to 2nd floor apartment)       Home Layout: One level Home Equipment: Rollator (4 wheels);BSC/3in1;Shower seat;Hand held shower head;Grab bars - toilet;Grab bars - tub/shower;Other (comment) (O2) Additional Comments: keeps 3 in 1 over her toilet    Prior Function Prior Level of  Function : Independent/Modified Independent             Mobility Comments: walks with a rollator, active with exercises classes (chair yoga, chair volleyball) at facility ADLs Comments: sits to shower, struggles with laundry, meals and heavy housekeeping provided by ILF, facility and family provides transportation     Extremity/Trunk Assessment   Upper Extremity Assessment Upper Extremity Assessment: Defer to OT evaluation    Lower Extremity  Assessment Lower Extremity Assessment: Generalized weakness (AROM WFL, strength grossly 3+/5, denies numbness/tingling throughout)    Cervical / Trunk Assessment Cervical / Trunk Assessment: Kyphotic  Communication   Communication Communication: No apparent difficulties  Cognition Arousal: Alert Behavior During Therapy: WFL for tasks assessed/performed Overall Cognitive Status: Within Functional Limits for tasks assessed                                          General Comments      Exercises General Exercises - Lower Extremity Long Arc Quad: Seated, AROM, Strengthening, Both, 10 reps Hip Flexion/Marching: Seated, AROM, Strengthening, Both, 10 reps Toe Raises: Seated, AROM, Strengthening, Both, 10 reps Heel Raises: Seated, AROM, Strengthening, Both, 10 reps   Assessment/Plan    PT Assessment Patient needs continued PT services  PT Problem List Decreased strength;Decreased activity tolerance;Decreased balance;Cardiopulmonary status limiting activity       PT Treatment Interventions DME instruction;Gait training;Functional mobility training;Therapeutic activities;Therapeutic exercise;Balance training;Neuromuscular re-education;Patient/family education    PT Goals (Current goals can be found in the Care Plan section)  Acute Rehab PT Goals Patient Stated Goal: return home PT Goal Formulation: With patient Time For Goal Achievement: 11/27/22 Potential to Achieve Goals: Good    Frequency Min 1X/week     Co-evaluation               AM-PAC PT "6 Clicks" Mobility  Outcome Measure Help needed turning from your back to your side while in a flat bed without using bedrails?: A Little Help needed moving from lying on your back to sitting on the side of a flat bed without using bedrails?: A Little Help needed moving to and from a bed to a chair (including a wheelchair)?: A Little Help needed standing up from a chair using your arms (e.g., wheelchair or  bedside chair)?: A Little Help needed to walk in hospital room?: A Little Help needed climbing 3-5 steps with a railing? : A Lot 6 Click Score: 17    End of Session Equipment Utilized During Treatment: Oxygen Activity Tolerance: Patient tolerated treatment well Patient left: in chair;with call bell/phone within reach Nurse Communication: Mobility status PT Visit Diagnosis: Other abnormalities of gait and mobility (R26.89);Muscle weakness (generalized) (M62.81)    Time: 9147-8295 PT Time Calculation (min) (ACUTE ONLY): 31 min   Charges:   PT Evaluation $PT Eval Low Complexity: 1 Low PT Treatments $Therapeutic Exercise: 8-22 mins PT General Charges $$ ACUTE PT VISIT: 1 Visit        Tori Verba Ainley PT, DPT 11/13/22, 11:22 AM

## 2022-11-13 NOTE — Telephone Encounter (Signed)
So sorry to hear she is feeling poorly and hope she feels better. Okay to cancel CT.   Alver Sorrow, NP

## 2022-11-13 NOTE — Plan of Care (Signed)
  Problem: Education: Goal: Knowledge of General Education information will improve Description: Including pain rating scale, medication(s)/side effects and non-pharmacologic comfort measures Outcome: Progressing   Problem: Coping: Goal: Level of anxiety will decrease Outcome: Progressing   Problem: Pain Managment: Goal: General experience of comfort will improve Outcome: Progressing   

## 2022-11-14 ENCOUNTER — Inpatient Hospital Stay (HOSPITAL_COMMUNITY): Payer: Medicare HMO

## 2022-11-14 DIAGNOSIS — E1159 Type 2 diabetes mellitus with other circulatory complications: Secondary | ICD-10-CM | POA: Diagnosis not present

## 2022-11-14 DIAGNOSIS — K219 Gastro-esophageal reflux disease without esophagitis: Secondary | ICD-10-CM | POA: Diagnosis not present

## 2022-11-14 DIAGNOSIS — J9621 Acute and chronic respiratory failure with hypoxia: Secondary | ICD-10-CM | POA: Diagnosis not present

## 2022-11-14 DIAGNOSIS — E1169 Type 2 diabetes mellitus with other specified complication: Secondary | ICD-10-CM | POA: Diagnosis not present

## 2022-11-14 LAB — CBC WITH DIFFERENTIAL/PLATELET
Abs Immature Granulocytes: 0.03 10*3/uL (ref 0.00–0.07)
Basophils Absolute: 0 10*3/uL (ref 0.0–0.1)
Basophils Relative: 0 %
Eosinophils Absolute: 0 10*3/uL (ref 0.0–0.5)
Eosinophils Relative: 1 %
HCT: 40.8 % (ref 36.0–46.0)
Hemoglobin: 12.4 g/dL (ref 12.0–15.0)
Immature Granulocytes: 0 %
Lymphocytes Relative: 9 %
Lymphs Abs: 0.8 10*3/uL (ref 0.7–4.0)
MCH: 28.8 pg (ref 26.0–34.0)
MCHC: 30.4 g/dL (ref 30.0–36.0)
MCV: 94.7 fL (ref 80.0–100.0)
Monocytes Absolute: 0.7 10*3/uL (ref 0.1–1.0)
Monocytes Relative: 8 %
Neutro Abs: 6.9 10*3/uL (ref 1.7–7.7)
Neutrophils Relative %: 82 %
Platelets: 250 10*3/uL (ref 150–400)
RBC: 4.31 MIL/uL (ref 3.87–5.11)
RDW: 16 % — ABNORMAL HIGH (ref 11.5–15.5)
WBC: 8.4 10*3/uL (ref 4.0–10.5)
nRBC: 0 % (ref 0.0–0.2)

## 2022-11-14 LAB — COMPREHENSIVE METABOLIC PANEL
ALT: 19 U/L (ref 0–44)
AST: 18 U/L (ref 15–41)
Albumin: 3.5 g/dL (ref 3.5–5.0)
Alkaline Phosphatase: 73 U/L (ref 38–126)
Anion gap: 12 (ref 5–15)
BUN: 31 mg/dL — ABNORMAL HIGH (ref 8–23)
CO2: 34 mmol/L — ABNORMAL HIGH (ref 22–32)
Calcium: 9.2 mg/dL (ref 8.9–10.3)
Chloride: 96 mmol/L — ABNORMAL LOW (ref 98–111)
Creatinine, Ser: 0.99 mg/dL (ref 0.44–1.00)
GFR, Estimated: 57 mL/min — ABNORMAL LOW (ref >60)
Glucose, Bld: 94 mg/dL (ref 70–99)
Potassium: 3.8 mmol/L (ref 3.5–5.1)
Sodium: 142 mmol/L (ref 135–145)
Total Bilirubin: 0.9 mg/dL (ref 0.3–1.2)
Total Protein: 7.2 g/dL (ref 6.5–8.1)

## 2022-11-14 LAB — GLUCOSE, CAPILLARY
Glucose-Capillary: 93 mg/dL (ref 70–99)
Glucose-Capillary: 93 mg/dL (ref 70–99)
Glucose-Capillary: 216 mg/dL — ABNORMAL HIGH (ref 70–99)
Glucose-Capillary: 125 mg/dL — ABNORMAL HIGH (ref 70–99)

## 2022-11-14 LAB — PHOSPHORUS: Phosphorus: 5.3 mg/dL — ABNORMAL HIGH (ref 2.5–4.6)

## 2022-11-14 LAB — MAGNESIUM: Magnesium: 2.5 mg/dL — ABNORMAL HIGH (ref 1.7–2.4)

## 2022-11-14 MED ORDER — IPRATROPIUM-ALBUTEROL 0.5-2.5 (3) MG/3ML IN SOLN
3.0000 mL | Freq: Two times a day (BID) | RESPIRATORY_TRACT | Status: DC
  Administered 2022-11-14 – 2022-11-16 (×4): 3 mL via RESPIRATORY_TRACT
  Filled 2022-11-14 (×4): qty 3

## 2022-11-14 MED ORDER — METOPROLOL TARTRATE 25 MG PO TABS
25.0000 mg | ORAL_TABLET | Freq: Two times a day (BID) | ORAL | Status: DC
  Administered 2022-11-14 – 2022-11-16 (×4): 25 mg via ORAL
  Filled 2022-11-14 (×4): qty 1

## 2022-11-14 MED ORDER — DOXYCYCLINE HYCLATE 100 MG PO TABS
100.0000 mg | ORAL_TABLET | Freq: Two times a day (BID) | ORAL | Status: DC
  Administered 2022-11-14 – 2022-11-16 (×4): 100 mg via ORAL
  Filled 2022-11-14 (×4): qty 1

## 2022-11-14 NOTE — Plan of Care (Signed)
  Problem: Clinical Measurements: Goal: Will remain free from infection Outcome: Progressing Goal: Diagnostic test results will improve Outcome: Progressing Goal: Respiratory complications will improve Outcome: Progressing Goal: Cardiovascular complication will be avoided Outcome: Progressing   Problem: Activity: Goal: Risk for activity intolerance will decrease Outcome: Progressing   

## 2022-11-14 NOTE — Progress Notes (Signed)
Mobility Specialist - Progress Note  Pre-mobility: 100 bpm HR, 94% SpO2 During mobility: 152 bpm HR, 88% SpO2 Post-mobility: 96 bpm HR, 94% SPO2   11/14/22 1030  Oxygen Therapy  O2 Device Nasal Cannula  O2 Flow Rate (L/min) 3 L/min  Patient Activity (if Appropriate) Ambulating  Mobility  Activity Transferred to/from [facility]  Level of Assistance Contact guard assist, steadying assist  Assistive Device Front wheel walker  Distance Ambulated (ft) 10 ft  Range of Motion/Exercises Active Assistive  Activity Response Tolerated poorly  Mobility Referral Yes  $Mobility charge 1 Mobility  Mobility Specialist Start Time (ACUTE ONLY) 1010  Mobility Specialist Stop Time (ACUTE ONLY) 1030  Mobility Specialist Time Calculation (min) (ACUTE ONLY) 20 min   Pt was found on recliner chair and agreeable to ambulate after using BSC. Assisted to [facility] with CG. Afterwards pt brushed her teeth and ready to ambulate. After taking ~5 steps into hallway pt stated feeling dizzy. Returned back to Production designer, theatre/television/film. Pt able to increase SPO2 >90% within 1 min and stated her heart racing HR checked to be 152 bpm. HR also able to decrease to low 100s within 1 min. At EOS was left on recliner chair with all needs met. Call bell in reach.  Billey Chang Mobility Specialist

## 2022-11-14 NOTE — Progress Notes (Signed)
Occupational Therapy Treatment Patient Details Name: Lindsay [name] MRN: [id number] DOB: 09-12-1940 Today's Date: 11/14/2022   History of present illness Pt is an 82 year old woman admitted on 10/18 with shortness of breath and fatigue. + acute on chronic respiratory failure. PMH: COPD on 2-3L at baseline, CHF, HTN, HLD, CAD, PAF, asthma, hypothyroid, GAD.   OT comments  Patient was educated on deep diaphragmatic breathing and taking rest breaks during session. Patient verbalized understanding. Patient was able to participate in toileting tasks in bathroom with HR noted to flash up to 175 bpm with patient educated on rest breaks and then returned to recliner in room. Nurse made aware. Patient would need increased A at ILF in next level of care with HR limiting participation in all ADL tasks. Patient's discharge plan remains appropriate at this time. OT will continue to follow acutely.        If plan is discharge home, recommend the following:  A little help with bathing/dressing/bathroom;Assistance with cooking/housework;Assist for transportation;Help with stairs or ramp for entrance;A little help with walking and/or transfers   Equipment Recommendations  None recommended by OT       Precautions / Restrictions Precautions Precautions: Fall Precaution Comments: watch sats and BP Restrictions Weight Bearing Restrictions: No       Mobility Bed Mobility Overal bed mobility: Needs Assistance Bed Mobility: Supine to Sit     Supine to sit: Supervision, HOB elevated, Used rails                 Balance Overall balance assessment: Mild deficits observed, not formally tested   Sitting balance-Leahy Scale: Good       Standing balance-Leahy Scale: Fair     ADL either performed or assessed with clinical judgement   ADL Overall ADL's : Needs assistance/impaired       Toilet Transfer: Supervision/safety;Rolling walker (2 wheels);Ambulation Toilet Transfer Details (indicate cue  type and reason): with HR up to 175 bpm with transition to bathroom. patient reporting having increased HR Toileting- Clothing Manipulation and Hygiene: Supervision/safety;Sit to/from stand Toileting - Clothing Manipulation Details (indicate cue type and reason): with increased time.              Cognition Arousal: Alert Behavior During Therapy: WFL for tasks assessed/performed Overall Cognitive Status: Within Functional Limits for tasks assessed                             Pertinent Vitals/ Pain       Pain Assessment Pain Assessment: No/denies pain         Frequency  Min 1X/week        Progress Toward Goals  OT Goals(current goals can now be found in the care plan section)  Progress towards OT goals: Progressing toward goals     Plan         AM-PAC OT "6 Clicks" Daily Activity     Outcome Measure   Help from another person eating meals?: None Help from another person taking care of personal grooming?: A Little Help from another person toileting, which includes using toliet, bedpan, or urinal?: A Little Help from another person bathing (including washing, rinsing, drying)?: A Little Help from another person to put on and taking off regular upper body clothing?: A Little Help from another person to put on and taking off regular lower body clothing?: A Little 6 Click Score: 19    End of Session Equipment Utilized During Treatment:  Gait belt;Rolling walker (2 wheels);Oxygen  OT Visit Diagnosis: Unsteadiness on feet (R26.81);Other abnormalities of gait and mobility (R26.89);Muscle weakness (generalized) (M62.81);Other (comment)   Activity Tolerance Patient tolerated treatment well   Patient Left in chair;with call bell/phone within reach   Nurse Communication Mobility status;Other (comment) (HR during session)        Time: 6644-0347 OT Time Calculation (min): 27 min  Charges: OT General Charges $OT Visit: 1 Visit OT Treatments $Self  Care/Home Management : 23-37 mins  Rosalio Loud, MS Acute Rehabilitation Department Office# [phone]   Selinda Flavin 11/14/2022, 4:42 PM

## 2022-11-14 NOTE — Progress Notes (Signed)
PROGRESS NOTE    Lindsay [name]  UJW:[id number] DOB: 03/07/1940 DOA: 11/10/2022 PCP: Tollie Eth, NP   Brief Narrative:  HPI per Dr. Sanda Klein   Lindsay [name] is a 82 y.o. female with medical history significant of diastolic CHF, pansinusitis, chronic respiratory failure on home oxygen, asthma, permanent atrial fibrillation, dysphagia, dyspnea, elevated troponin, hyperlipidemia, hypertension, hypokalemia, hyponatremia LGSIL, moderate persistent asthma has been having progressively worse dyspnea associated with wheezing, nonproductive cough and fatigue since Monday.  She stated that she was served a meal at the facility that felt salty to taste on Sunday or Monday.  No fever or chills.  No sore throat.  No travel history or sick contacts.  No fever, chills or night sweats. No sore throat, rhinorrhea, dyspnea, wheezing or hemoptysis.  No chest pain, palpitations, diaphoresis, PND, or orthopnea.  No abdominal pain, diarrhea, constipation, melena or hematochezia.  No flank pain, dysuria, frequency or hematuria.  No polyuria, polydipsia, polyphagia or blurred vision.   **Interim History   We were continuing Diuresis and had increased the dose to Lasix 40 mg IV BID and stopped yesterday due to her worsening creatinine.  Repeat chest x-ray showed bibasilar opacities in the right upper lobe opacity and small bilateral pleural effusions.  Will add doxycycline and have her ambulate again in the morning as her heart rate up to the 160s and she became dizzy.  She will need another ambulatory home O2 screen prior to discharge and further monitoring of her heart rate and respiratory status.  Assessment and Plan:  Acute on chronic respiratory failure with hypoxia and hypercapnia (HCC) in the setting of Acute on Chronic Diastolic CHF (congestive heart failure) (HCC) and superimposed on Chronic obstructive pulmonary disease (HCC); slowly improving -Changed to Inpatient Progressive -Continuous pulse oximetry  maintain O2 saturation greater than 90% Continue supplemental oxygen via nasal cannula and wean to home regimen  -Continue with scheduled and as needed bronchodilators. -Strict I's and O's and daily weights with sodium and fluid restriction.  Intake/Output Summary (Last 24 hours) at 11/14/2022 1930 Last data filed at 11/14/2022 1858 Gross per 24 hour  Intake 960 ml  Output 1000 ml  Net -40 ml  -Was being diuresed with IV furosemide 40 mg twice daily but now stopped -Stopped lisinopril and resume metoprolol -BNP was elevated at 360.2 and will repeat in the morning -Repeat ECHOCardiogram was done and showed "Left ventricular ejection fraction, by estimation, is 55 to 60%. The left ventricle has normal function. The left ventricle has no regional wall motion abnormalities. There is mild concentric left ventricular hypertrophy. Left ventricular diastolic parameters are indeterminate.  RV McConnell's Sign oted. Right ventricular systolic function is mildly reduced. The right ventricular size is severely enlarged.  Left atrial size was moderately dilated. Right atrial size was moderately dilated. A small pericardial effusion is present. The pericardial effusion is surrounding the apex. The mitral valve was not well visualized. No evidence of mitral valve regurgitation. No evidence of mitral stenosis. Tricuspid valve regurgitation is mild to moderate. The aortic valve was not well visualized. There is mild calcification of the aortic valve. There is mild thickening of the aortic valve. Aortic valve regurgitation is not visualized. " -WBC Trend: Recent Labs  Lab 11/10/22 0827 11/11/22 0542 11/12/22 0503 11/13/22 0452 11/14/22 0543  WBC 9.4 11.2* 9.9 9.7 8.4  -On DuoNeb 4x Daily and now on Prednisone 40 mg po Daily and will continue for now -Patient felt like she was getting food and medication stuck  in her chest and order SLP and they are recommending regular diet -CTA Chest done and showed "Mild  dilatation of the aortic arch a 4 cm, stable. Aortic atherosclerosis.  No acute aortic findings. Multi chamber cardiomegaly. Moderate right and small to moderate left pleural effusions with associated compressive atelectasis. Heterogeneous areas of ground-glass in both lungs may represent edema. Chronic compression deformities of T7, T8, T11, T12, and upper lumbar spine." -CXR done this AM showed "No significant interval change in lung aeration with unchanged small bilateral pleural effusions, bibasilar airspace opacities, and patchy right upper lobe opacities." -Will add doxycycline 100 mg p.o. twice daily for her COPD -Continue to monitor for signs and symptoms of volume overload and repeat chest x-ray in a.m and she will need an amatory home O2 screen prior to discharge; patient was about 50% better and repeat chest x-ray in a.m.   Hypertension associated with diabetes (HCC) but having softer BP -Holding metoprolol due to softer blood pressures initially but now will resume. -During this admission she was started low-dose Lisinopril but I discontinue this -We have discontinued furosemide 40 mg IV BID as she appears euvolemic -Continue to Monitor BP per Protocol and will resume the metoprolol tartrate 25 mg twice daily given her tachycardia -Last BP Reading was on the softer side at 107/68   Hyperlipidemia associated with type 2 diabetes mellitus (HCC) -Continue Atorvastatin 40 mg p.o. daily.    Persistent Atrial Fibrillation (HCC) -Continue Apixaban 5 mg p.o. twice daily. -Metoprolol Held due to Softer BP but will resume given Tachycardia while ambulating -Continue to Monitor on Telemetry heart rates went up to almost 160s today  Depression  GAD (generalized anxiety disorder) -Continue Lorazepam 1 mg p.o. twice daily as needed. -C/w Sertraline 100 mg po Daily    Hypothyroidism -Checked TSH level and was 0.370 -Not on Levothyroxine.   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 -Obtained while in the hospital and showed "Mild dilatation of the aortic arch a 4 cm, stable"   Normocytic Anemia -Hgb/Hct Trend improved and Hgb/Hct is now 12.4/40.8 -Anemia panel done and showed an iron level of 45, UIBC of 264, TIBC of 409, saturation ratios of 11%, ferritin level 27, follow-up 6.4 and vitamin B12 163 -Will start vitamin B12 supplementation and give her a dose of IM B12 -Continue to Monitor for S/Sx of Bleeding; No overt bleeding noted -Repeat CBC in the AM   GERD/GI Prophylaxis -C/w Pantoprazole 20 mg po Daily   Obesity -Complicates overall prognosis and care -Estimated body mass index is 37.81 kg/m as calculated from the following:   Height as of this encounter: 5\' 8"  (1.727 m).   Weight as of this encounter: 112.8 kg.  -Weight Loss and Dietary Counseling given   DVT prophylaxis:  apixaban (ELIQUIS) tablet 5 mg    Code Status: Full Code Family Communication: No family present at bedside  Disposition Plan:  Level of care: Progressive Status is: Inpatient Remains inpatient appropriate because: Improvement and when she ambulated today her heart rate went up to 160s so we will resume her metoprolol.  She also felt a little lightheaded and dizzy and will need to ensure that she is not prior to discharge and obtain orthostatics in the morning   Consultants:  None  Procedures:  As delineated as above  Antimicrobials:  Anti-infectives (From admission, onward)    Start     Dose/Rate Route Frequency Ordered Stop   11/14/22 2200  doxycycline (VIBRA-TABS) tablet 100 mg  100 mg Oral Every 12 hours 11/14/22 1934         Subjective: Seen and examined at bedside and her respiratory status is slowly improving thinks that she is breathing little bit better.  States that she is she able to cough up some sputum today.  States that she ambulated but became dizzy upon ambulation and heart rates In the 150s -160s and she became short of breath.   Wanting to walk again after lunch to see.  No other concerns or complaints this time.  Objective: Vitals:   11/14/22 0528 11/14/22 0750 11/14/22 1152 11/14/22 1857  BP:   105/72 107/68  Pulse:   99   Resp:   18   Temp:   97.8 F (36.6 C)   TempSrc:   Oral   SpO2:  93% 95%   Weight: 112.8 kg     Height:        Intake/Output Summary (Last 24 hours) at 11/14/2022 1937 Last data filed at 11/14/2022 1858 Gross per 24 hour  Intake 960 ml  Output 1000 ml  Net -40 ml   Filed Weights   11/12/22 0529 11/13/22 0510 11/14/22 0528  Weight: 109.3 kg 109 kg 112.8 kg   Examination: Physical Exam:  Constitutional: WN/WD obese Caucasian female in no acute distress sitting up in the chair Respiratory: Diminished to auscultation bilaterally with some coarse breath sounds and some slight crackles and rhonchi.  No appreciable rales or wheezing. Normal respiratory effort and patient is not tachypenic. No accessory muscle use.  Wearing supplemental oxygen via nasal cannula Cardiovascular: Irregularly irregular, no murmurs / rubs / gallops. S1 and S2 auscultated.  Trace lower extremity edema.  Abdomen: Soft, non-tender, distended secondary to body habitus. Bowel sounds positive.  GU: Deferred. Musculoskeletal: No clubbing / cyanosis of digits/nails. No joint deformity upper and lower extremities.  Skin: No rashes, lesions, ulcers on limited skin evaluation. No induration; Warm and dry.  Neurologic: CN 2-12 grossly intact with no focal deficits. Romberg sign and cerebellar reflexes not assessed.  Psychiatric: Normal judgment and insight. Alert and oriented x 3. Normal mood and appropriate affect.   Data Reviewed: I have personally reviewed following labs and imaging studies  CBC: Recent Labs  Lab 11/10/22 0827 11/10/22 0913 11/11/22 0542 11/12/22 0503 11/13/22 0452 11/14/22 0543  WBC 9.4  --  11.2* 9.9 9.7 8.4  NEUTROABS 8.4*  --   --  8.4* 8.0* 6.9  HGB 11.4* 11.9* 12.5 11.7* 11.5* 12.4   HCT 36.2 35.0* 40.7 38.6 37.5 40.8  MCV 92.6  --  95.1 95.5 94.0 94.7  PLT 247  --  224 257 238 250   Basic Metabolic Panel: Recent Labs  Lab 11/10/22 0827 11/10/22 0913 11/11/22 0542 11/12/22 0503 11/13/22 0452 11/14/22 0543  NA 140 139 143 143 139 142  K 5.0 4.4 4.2 3.9 3.8 3.8  CL 105  --  100 101 96* 96*  CO2 30  --  33* 33* 33* 34*  GLUCOSE 152*  --  119* 92 104* 94  BUN 11  --  17 25* 32* 31*  CREATININE 0.80  --  0.81 0.95 1.09* 0.99  CALCIUM 8.9  --  9.0 9.0 8.6* 9.2  MG  --   --   --  2.5* 2.4 2.5*  PHOS  --   --   --  4.6 4.5 5.3*   GFR: Estimated Creatinine Clearance: 57.8 mL/min (by C-G formula based on SCr of 0.99 mg/dL). Liver Function Tests:  Recent Labs  Lab 11/11/22 0542 11/12/22 0503 11/13/22 0452 11/14/22 0543  AST 17 17 17 18   ALT 20 19 18 19   ALKPHOS 77 71 69 73  BILITOT 0.7 0.8 0.7 0.9  PROT 7.1 7.1 6.7 7.2  ALBUMIN 3.5 3.5 3.4* 3.5   No results for input(s): "LIPASE", "AMYLASE" in the last 168 hours. No results for input(s): "AMMONIA" in the last 168 hours. Coagulation Profile: No results for input(s): "INR", "PROTIME" in the last 168 hours. Cardiac Enzymes: No results for input(s): "CKTOTAL", "CKMB", "CKMBINDEX", "TROPONINI" in the last 168 hours. BNP (last 3 results) No results for input(s): "PROBNP" in the last 8760 hours. HbA1C: No results for input(s): "HGBA1C" in the last 72 hours. CBG: Recent Labs  Lab 11/13/22 1648 11/13/22 2038 11/14/22 0726 11/14/22 1149 11/14/22 1639  GLUCAP 126* 130* 93 93 216*   Lipid Profile: No results for input(s): "CHOL", "HDL", "LDLCALC", "TRIG", "CHOLHDL", "LDLDIRECT" in the last 72 hours. Thyroid Function Tests: No results for input(s): "TSH", "T4TOTAL", "FREET4", "T3FREE", "THYROIDAB" in the last 72 hours. Anemia Panel: Recent Labs    11/12/22 0503 11/12/22 0504  VITAMINB12 163*  --   FOLATE 6.4  --   FERRITIN 27  --   TIBC 409  --   IRON 45  --   RETICCTPCT  --  2.5   Sepsis  Labs: No results for input(s): "PROCALCITON", "LATICACIDVEN" in the last 168 hours.  Recent Results (from the past 240 hour(s))  SARS Coronavirus 2 by RT PCR (hospital order, performed in [facility] hospital lab) *cepheid single result test* Anterior Nasal Swab     Status: None   Collection Time: 11/10/22  8:22 AM   Specimen: Anterior Nasal Swab  Result Value Ref Range Status   SARS Coronavirus 2 by RT PCR NEGATIVE NEGATIVE Final    Comment: (NOTE) SARS-CoV-2 target nucleic acids are NOT DETECTED.  The SARS-CoV-2 RNA is generally detectable in upper and lower respiratory specimens during the acute phase of infection. The lowest concentration of SARS-CoV-2 viral copies this assay can detect is 250 copies / mL. A negative result does not preclude SARS-CoV-2 infection and should not be used as the sole basis for treatment or other patient management decisions.  A negative result may occur with improper specimen collection / handling, submission of specimen other than nasopharyngeal swab, presence of viral mutation(s) within the areas targeted by this assay, and inadequate number of viral copies (<250 copies / mL). A negative result must be combined with clinical observations, patient history, and epidemiological information.  Fact Sheet for Patients:   RoadLapTop.co.za  Fact Sheet for Healthcare Providers: http://kim-miller.com/  This test is not yet approved or  cleared by the [place] FDA and has been authorized for detection and/or diagnosis of SARS-CoV-2 by FDA under an Emergency Use Authorization (EUA).  This EUA will remain in effect (meaning this test can be used) for the duration of the COVID-19 declaration under Section 564(b)(1) of the Act, 21 U.S.C. section 360bbb-3(b)(1), unless the authorization is terminated or revoked sooner.  Performed at [facility], [address] [place], [place] [address]      Radiology Studies: DG CHEST PORT 1 VIEW  Result Date: 11/14/2022 CLINICAL DATA:  Shortness of breath EXAM: PORTABLE CHEST 1 VIEW COMPARISON:  Chest radiograph 11/13/2022 FINDINGS: The cardiomediastinal silhouette is grossly unchanged. Bilateral pleural effusions with adjacent bibasilar airspace opacity as well as patchy opacities in the right upper lobe are overall unchanged. There is no definite  pneumothorax There is no acute osseous abnormality. IMPRESSION: No significant interval change in lung aeration with unchanged small bilateral pleural effusions, bibasilar airspace opacities, and patchy right upper lobe opacities. Electronically Signed   By: Lesia Hausen M.D.   On: 11/14/2022 10:28   DG CHEST PORT 1 VIEW  Result Date: 11/13/2022 CLINICAL DATA:  Shortness of breath. EXAM: PORTABLE CHEST 1 VIEW COMPARISON:  November 12, 2022. FINDINGS: Stable cardiomegaly. Minimal bibasilar subsegmental atelectasis is noted with small pleural effusions. Bony thorax is unremarkable. IMPRESSION: Minimal bibasilar subsegmental atelectasis with small pleural effusions. Electronically Signed   By: Lupita Raider M.D.   On: 11/13/2022 10:33    Scheduled Meds:  apixaban  5 mg Oral BID   atorvastatin  40 mg Oral Daily   vitamin B-12  1,000 mcg Oral Daily   doxycycline  100 mg Oral Q12H   guaiFENesin  1,200 mg Oral BID   insulin aspart  0-20 Units Subcutaneous TID WC   ipratropium-albuterol  3 mL Nebulization BID   loratadine  10 mg Oral QHS   metoprolol tartrate  25 mg Oral BID   mirtazapine  45 mg Oral QHS   pantoprazole  20 mg Oral QHS   predniSONE  40 mg Oral Q breakfast   sertraline  100 mg Oral QHS   Continuous Infusions:   LOS: 4 days   Marguerita Merles, DO [facility] Available via Epic secure chat 7am-7pm After these hours, please refer to coverage provider listed on [email or web address] 11/14/2022, 7:37 PM

## 2022-11-14 NOTE — Evaluation (Addendum)
Clinical/Bedside Swallow Evaluation Patient Details  Name: Lindsay [name] MRN: [id number] Date of Birth: [birth year]  Today's Date: 11/14/2022 Time: SLP Start Time (ACUTE ONLY): 1725 SLP Stop Time (ACUTE ONLY): 1800 SLP Time Calculation (min) (ACUTE ONLY): 35 min  Past Medical History:  Past Medical History:  Diagnosis Date   Acute diastolic CHF (congestive heart failure) (HCC) 08/03/2020   Acute non-recurrent pansinusitis 10/21/2020   Acute on chronic respiratory failure with hypoxia (HCC) 08/02/2020   Asthma    Asthma exacerbation 08/02/2020   Atrial fibrillation (HCC)    Dysphagia 08/02/2020   Dyspnea 05/09/2021   Elevated troponin 09/14/2020   High cholesterol    HTN (hypertension)    Hyperkalemia 09/14/2020   Hyponatremia 08/02/2020   Low grade squamous intraepithelial lesion on cytologic smear of cervix (LGSIL) 12/02/2019   Moderate persistent asthma without complication 12/02/2019   Respiratory failure with hypoxia (HCC)    Sore throat 11/16/2020   Past Surgical History:  Past Surgical History:  Procedure Laterality Date   CARDIOVERSION N/A 09/13/2020   Procedure: CARDIOVERSION;  Surgeon: Parke Poisson, MD;  Location: [facility] ENDOSCOPY;  Service: Cardiovascular;  Laterality: N/A;   ESOPHAGOGASTRODUODENOSCOPY N/A 08/05/2020   Procedure: ESOPHAGOGASTRODUODENOSCOPY (EGD);  Surgeon: Kathi Der, MD;  Location: [facility] ENDOSCOPY;  Service: Gastroenterology;  Laterality: N/A;   HPI:  82 year old female admitted to [facility] long 1018 with shortness of breath and fatigue.  Patient had history of COPD on 3 L of oxygen at baseline, CHF, hypertension, hyperlipidemia, coronary artery disease, PAF, asthma, hypothyroidism, GAD.  Patient was placed on bronchodilators, steroids and diuretics.  She reported to MD issues with swallowing multiple pills at once, sensing they were sticking -pointing to the distal esophagus causing significant discomfort.  Patient has known history of severe  esophageal dysmotility with barium tablet halting at midesophagus suspected due to dysmotility and not narrowing per esophagram 05/25/2022.  She is on a PPI that she takes nightly.    Assessment / Plan / Recommendation  Clinical Impression  Patient presents with functional oropharyngeal swallow ability based on clinical swallow evaluation.  Subtle labial asymmetry on the left which patient reports is baseline noted.  Patient to self-feed and is observed to swallow multiple times with liquid boluses which she advises is due to breaking up the liquid.  Patient is describing piecemealing which is functional for her.  Inhalation noted post swallow with straw sips not observed via cup thus advised patient drink from a cup if she tolerates this better, especially given known history of dysmotility.   Lindsay [name] uses caution and takes small bites and sips and attempts to [facility] thoroughly without significant oral retention.  Suspect her dysphagia symptoms are due to known severe esophageal dysmotility and thus provided her with written and verbal compensation strategies.  Due to her xerostomia advise she start all of her intake with liquids, eat small frequent meals and take medications 1 to 2 pills at a time.  Encouraged her to continue to use her flutter valve as MD ordered but advised she use it before she eats given amount of esophageal retention on DG esoph 05/2022.  Using teach back patient educated and will follow-up x 1 to assure patient is tolerating intake as optimally as possible. SLP Visit Diagnosis: Other (comment) (Patient with known esophageal dysmotility per esophagram May 2024)    Aspiration Risk  Mild aspiration risk    Diet Recommendation Regular;Thin liquid    Liquid Administration via: Cup;Straw Medication Administration: Other (Comment) (1-2 at a time) Supervision:  Patient able to self feed Compensations: Slow rate;Small sips/bites;Other (Comment) (Start intake with liquids)    Other   Recommendations Oral Care Recommendations: Oral care BID    Recommendations for follow up therapy are one component of a multi-disciplinary discharge planning process, led by the attending physician.  Recommendations may be updated based on patient status, additional functional criteria and insurance authorization.  Follow up Recommendations Follow physician's recommendations for discharge plan and follow up therapies      Assistance Recommended at Discharge    Functional Status Assessment Patient has had a recent decline in their functional status and demonstrates the ability to make significant improvements in function in a reasonable and predictable amount of time.  Frequency and Duration min 1 x/week  1 week       Prognosis Prognosis for improved oropharyngeal function: Good Barriers to Reach Goals: Time post onset      Swallow Study   General HPI: 82 year old female admitted to [facility] long 1018 with shortness of breath and fatigue.  Patient had history of COPD on 3 L of oxygen at baseline, CHF, hypertension, hyperlipidemia, coronary artery disease, PAF, asthma, hypothyroidism, GAD.  Patient was placed on bronchodilators, steroids and diuretics.  She reported to MD issues with swallowing multiple pills at once, sensing they were sticking -pointing to the distal esophagus causing significant discomfort.  Patient has known history of severe esophageal dysmotility with barium tablet halting at midesophagus suspected due to dysmotility and not narrowing per esophagram 05/25/2022.  She is on a PPI that she takes nightly. Type of Study: Bedside Swallow Evaluation Previous Swallow Assessment: See HPI Diet Prior to this Study: Regular;Thin liquids (Level 0) Temperature Spikes Noted: No Respiratory Status: Nasal cannula History of Recent Intubation: No Behavior/Cognition: Alert;Cooperative;Pleasant mood Oral Cavity Assessment: Dry;Other (comment) (Few areas of slightly erythemic patches on hard  palate, patient denies pain but admits to sensitivity.  She advises that she does not rinse after she uses her inhalers.) Oral Care Completed by SLP: No Oral Cavity - Dentition: Missing dentition Vision: Functional for self-feeding Self-Feeding Abilities: Able to feed self Patient Positioning: Upright in chair Baseline Vocal Quality: Hoarse Volitional Cough: Strong Volitional Swallow: Able to elicit    Oral/Motor/Sensory Function Overall Oral Motor/Sensory Function:  (Slight labial asymmetry which patient reports is normal)   Ice Chips Ice chips: Not tested   Thin Liquid Thin Liquid: Impaired Presentation: Cup;Straw;Self Fed Pharyngeal  Phase Impairments: Multiple swallows Other Comments: Inhalation observed post swallow of liquids via straw    Nectar Thick Nectar Thick Liquid: Not tested   Honey Thick Honey Thick Liquid: Not tested   Puree Puree: Not tested   Solid     Solid: Within functional limits Presentation: Self Fed      Erythema areas on hard palate that pt reports is sensitive but is not painful.    Chales Abrahams 11/14/2022,6:55 PM  Rolena Infante, MS [facility] SLP Acute Rehab Services Office [phone]

## 2022-11-14 NOTE — Progress Notes (Signed)
   11/14/22 1932  BiPAP/CPAP/SIPAP  BiPAP/CPAP/SIPAP Pt Type Adult  Reason BIPAP/CPAP not in use Non-compliant

## 2022-11-15 ENCOUNTER — Inpatient Hospital Stay (HOSPITAL_COMMUNITY): Payer: Medicare HMO

## 2022-11-15 DIAGNOSIS — I5033 Acute on chronic diastolic (congestive) heart failure: Secondary | ICD-10-CM | POA: Diagnosis not present

## 2022-11-15 DIAGNOSIS — J9621 Acute and chronic respiratory failure with hypoxia: Secondary | ICD-10-CM | POA: Diagnosis not present

## 2022-11-15 DIAGNOSIS — J9622 Acute and chronic respiratory failure with hypercapnia: Secondary | ICD-10-CM | POA: Diagnosis not present

## 2022-11-15 LAB — CBC WITH DIFFERENTIAL/PLATELET
Abs Immature Granulocytes: 0.03 10*3/uL (ref 0.00–0.07)
Basophils Absolute: 0 10*3/uL (ref 0.0–0.1)
Basophils Relative: 0 %
Eosinophils Absolute: 0.1 10*3/uL (ref 0.0–0.5)
Eosinophils Relative: 1 %
HCT: 43.7 % (ref 36.0–46.0)
Hemoglobin: 13.4 g/dL (ref 12.0–15.0)
Immature Granulocytes: 0 %
Lymphocytes Relative: 9 %
Lymphs Abs: 0.8 10*3/uL (ref 0.7–4.0)
MCH: 29.2 pg (ref 26.0–34.0)
MCHC: 30.7 g/dL (ref 30.0–36.0)
MCV: 95.2 fL (ref 80.0–100.0)
Monocytes Absolute: 0.6 10*3/uL (ref 0.1–1.0)
Monocytes Relative: 7 %
Neutro Abs: 6.9 10*3/uL (ref 1.7–7.7)
Neutrophils Relative %: 83 %
Platelets: 265 10*3/uL (ref 150–400)
RBC: 4.59 MIL/uL (ref 3.87–5.11)
RDW: 15.7 % — ABNORMAL HIGH (ref 11.5–15.5)
WBC: 8.4 10*3/uL (ref 4.0–10.5)
nRBC: 0 % (ref 0.0–0.2)

## 2022-11-15 LAB — COMPREHENSIVE METABOLIC PANEL
ALT: 19 U/L (ref 0–44)
AST: 18 U/L (ref 15–41)
Albumin: 3.4 g/dL — ABNORMAL LOW (ref 3.5–5.0)
Alkaline Phosphatase: 68 U/L (ref 38–126)
Anion gap: 10 (ref 5–15)
BUN: 28 mg/dL — ABNORMAL HIGH (ref 8–23)
CO2: 33 mmol/L — ABNORMAL HIGH (ref 22–32)
Calcium: 9.5 mg/dL (ref 8.9–10.3)
Chloride: 99 mmol/L (ref 98–111)
Creatinine, Ser: 0.86 mg/dL (ref 0.44–1.00)
GFR, Estimated: >60 mL/min (ref >60)
Glucose, Bld: 80 mg/dL (ref 70–99)
Potassium: 4.1 mmol/L (ref 3.5–5.1)
Sodium: 142 mmol/L (ref 135–145)
Total Bilirubin: 0.9 mg/dL (ref 0.3–1.2)
Total Protein: 7.2 g/dL (ref 6.5–8.1)

## 2022-11-15 LAB — GLUCOSE, CAPILLARY
Glucose-Capillary: 92 mg/dL (ref 70–99)
Glucose-Capillary: 112 mg/dL — ABNORMAL HIGH (ref 70–99)
Glucose-Capillary: 122 mg/dL — ABNORMAL HIGH (ref 70–99)
Glucose-Capillary: 128 mg/dL — ABNORMAL HIGH (ref 70–99)

## 2022-11-15 LAB — BRAIN NATRIURETIC PEPTIDE: B Natriuretic Peptide: 120 pg/mL — ABNORMAL HIGH (ref 0.0–100.0)

## 2022-11-15 LAB — PHOSPHORUS: Phosphorus: 4.6 mg/dL (ref 2.5–4.6)

## 2022-11-15 LAB — MAGNESIUM: Magnesium: 2.6 mg/dL — ABNORMAL HIGH (ref 1.7–2.4)

## 2022-11-15 MED ORDER — SODIUM CHLORIDE 0.9 % IV BOLUS
500.0000 mL | Freq: Once | INTRAVENOUS | Status: AC
  Administered 2022-11-15: 500 mL via INTRAVENOUS

## 2022-11-15 NOTE — Progress Notes (Signed)
Physical Therapy Treatment Patient Details Name: Lindsay [name] MRN: [id number] DOB: [birth year] Today's Date: 11/15/2022   History of Present Illness Pt is an 82 year old woman admitted on 10/18 with shortness of breath and fatigue. + acute on chronic respiratory failure. PMH: COPD on 2-3L at baseline, CHF, HTN, HLD, CAD, PAF, asthma, hypothyroid, GAD.    PT Comments  Pt hopeful to amb further with therapy today. Per RN, requesting therapist check orthostatics, HR and O2 with mobility. Pt's BP detailed below, noted to be orthostatic with position change from supine to sitting, reports lightheadedness, doesn't improve with time so returned to supine and BP returned to pt's normal. RN notified. Hopeful pt can continue to mobilize with therapy and return to [facility] with HHPT.   If plan is discharge home, recommend the following: Assistance with cooking/housework;Assist for transportation   Can travel by private vehicle        Equipment Recommendations  None recommended by PT    Recommendations for Other Services       Precautions / Restrictions Precautions Precautions: Fall Precaution Comments: watch sats and BP Restrictions Weight Bearing Restrictions: No     Mobility  Bed Mobility Overal bed mobility: Needs Assistance Bed Mobility: Supine to Sit, Sit to Supine     Supine to sit: Supervision, HOB elevated, Used rails Sit to supine: Supervision, HOB elevated, Used rails   General bed mobility comments: increased time and effort to upright into sitting EOB    Transfers                   General transfer comment: not attempted due to BP    Ambulation/Gait                   Stairs             Wheelchair Mobility     Tilt Bed    Modified Rankin (Stroke Patients Only)       Balance Overall balance assessment: Mild deficits observed, not formally tested   Sitting balance-Leahy Scale: Good                                       Cognition Arousal: Alert Behavior During Therapy: WFL for tasks assessed/performed Overall Cognitive Status: Within Functional Limits for tasks assessed                                          Exercises      General Comments        Pertinent Vitals/Pain Pain Assessment Pain Assessment: No/denies pain    Home Living                          Prior Function            PT Goals (current goals can now be found in the care plan section) Acute Rehab PT Goals Patient Stated Goal: return home PT Goal Formulation: With patient Time For Goal Achievement: 11/27/22 Potential to Achieve Goals: Good Progress towards PT goals: Progressing toward goals    Frequency    Min 1X/week      PT Plan      Co-evaluation              AM-PAC PT "6  Clicks" Mobility   Outcome Measure  Help needed turning from your back to your side while in a flat bed without using bedrails?: A Little Help needed moving from lying on your back to sitting on the side of a flat bed without using bedrails?: A Little Help needed moving to and from a bed to a chair (including a wheelchair)?: A Little Help needed standing up from a chair using your arms (e.g., wheelchair or bedside chair)?: A Little Help needed to walk in hospital room?: A Little Help needed climbing 3-5 steps with a railing? : A Lot 6 Click Score: 17    End of Session Equipment Utilized During Treatment: Oxygen Activity Tolerance: Treatment limited secondary to medical complications (Comment) (orthostatic) Patient left: in bed;with call bell/phone within reach;with bed alarm set Nurse Communication: Mobility status;Other (comment) (BP) PT Visit Diagnosis: Other abnormalities of gait and mobility (R26.89);Muscle weakness (generalized) (M62.81)     Time: 1610-9604 PT Time Calculation (min) (ACUTE ONLY): 18 min  Charges:    $Therapeutic Activity: 8-22 mins PT General Charges $$ ACUTE PT VISIT: 1  Visit                     Tori Kelsen [place] PT, DPT 11/15/22, 11:05 AM

## 2022-11-15 NOTE — Progress Notes (Signed)
Progress Note    Lindsay [name]   VHQ:[id number]  DOB: August 29, 1940  DOA: 11/10/2022     5 PCP: Tollie Eth, NP  Initial CC: cough, SOB  Hospital Course: Lindsay [name] is a 82 y.o. female with medical history significant of diastolic CHF, pansinusitis, chronic respiratory failure on home oxygen, asthma, permanent atrial fibrillation, dysphagia, dyspnea, elevated troponin, hyperlipidemia, hypertension, hypokalemia, hyponatremia, LGSIL, moderate persistent asthma.  She presented with worsening dyspnea with associated wheezing, cough, and fatigue. She was started on lasix for CHF and treatment for suspected COPD exacerbation as well.   Assessment and Plan:  Acute on chronic respiratory failure with hypoxia and hypercapnia Acute on Chronic Diastolic CHF COPD exacerbation -Continuous pulse oximetry maintain O2 saturation greater than 90%; on 2L chronic O2 at home - s/p lasix; possibly overdiuresed given some hypoTN and dizziness now; small fluid bolus today - continue lopressor; might need dose reduced still -Evaluated by cardiology recently but new findings on echo from recent visit; now with reduced RV systolic function, enlarged RV, and small pericardial effusion - with hypotension, dizziness, echo changes, and mildly increased O2 demand, would think possible PE but this was note noted on CTA chest on 11/11/22; also considered cardiac tamponade but effusion appears small per echo - will ask for cardiology input while patient still hospitalized  -Continue breathing treatments - Continue doxycycline and Mucinex - Prednisone course completed -BNP has down trended.  Repeat CXR shows persistent perihilar and peripheral interstitial edema vs infiltrate   HTN - continue lopressor for now - small fluid bolus given low BP still and symptomatic  - will need to resume home meds as able   Hyperlipidemia associated with type 2 diabetes mellitus (HCC) -Continue Atorvastatin 40 mg p.o. daily.     Persistent Atrial Fibrillation (HCC) -Continue Apixaban 5 mg p.o. twice daily. - continue lopressor  Depression  GAD (generalized anxiety disorder) -Continue Lorazepam 1 mg p.o. twice daily as needed. -C/w Sertraline 100 mg po Daily    Hypothyroidism -TSH level 0.370 -Not on Levothyroxine.   Aortic Arch Aneurysm (HCC) -Obtained CTA chest while in the hospital and showed "Mild dilatation of the aortic arch a 4 cm, stable"   Normocytic Anemia - now back to normal range?  - B12 low and on supplementation  -Folate low/normal -Iron stores adequate  GERD/GI Prophylaxis -C/w Pantoprazole 20 mg po Daily   Obesity -Complicates overall prognosis and care -Estimated body mass index is 37.81 kg/m as calculated from the following:   Height as of this encounter: 5\' 8"  (1.727 m).   Weight as of this encounter: 112.8 kg.  -Weight Loss and Dietary Counseling given   Interval History:  No events overnight.  Felt dizzy this morning when attempting to ambulate with therapy.  Blood pressure remains on the soft side. Cough still present but she states has overall improved.  Old records reviewed in assessment of this patient  Antimicrobials: Doxycycline 11/14/2022 >> current  DVT prophylaxis:   apixaban (ELIQUIS) tablet 5 mg   Code Status:   Code Status: Full Code  Mobility Assessment (Last 72 Hours)     Mobility Assessment     Row Name 11/15/22 1138 11/15/22 1102 11/15/22 0754 11/14/22 2123 11/14/22 1639   Does patient have an order for bedrest or is patient medically unstable -- -- No - Continue assessment No - Continue assessment --   What is the highest level of mobility based on the progressive mobility assessment? -- Level 2 (Chairfast) - Balance while  sitting on edge of bed and cannot stand Level 4 (Walks with assist in room) - Balance while marching in place and cannot step forward and back - Complete Level 4 (Walks with assist in room) - Balance while marching in place and  cannot step forward and back - Complete Level 4 (Walks with assist in room) - Balance while marching in place and cannot step forward and back - Complete   Is the above level different from baseline mobility prior to current illness? Yes - Recommend PT order -- -- -- --    Row Name 11/14/22 0948 11/13/22 2205 11/13/22 1119 11/13/22 0813 11/12/22 2015   Does patient have an order for bedrest or is patient medically unstable No - Continue assessment No - Continue assessment -- No - Continue assessment No - Continue assessment   What is the highest level of mobility based on the progressive mobility assessment? Level 4 (Walks with assist in room) - Balance while marching in place and cannot step forward and back - Complete Level 4 (Walks with assist in room) - Balance while marching in place and cannot step forward and back - Complete Level 4 (Walks with assist in room) - Balance while marching in place and cannot step forward and back - Complete Level 5 (Walks with assist in room/hall) - Balance while stepping forward/back and can walk in room with assist - Complete Level 4 (Walks with assist in room) - Balance while marching in place and cannot step forward and back - Complete            Barriers to discharge: none Disposition Plan:  Home Status is: Inpt  Objective: Blood pressure (!) 89/67, pulse 93, temperature 98.4 F (36.9 C), temperature source Oral, resp. rate 18, height 5\' 8"  (1.727 m), weight 111 kg, SpO2 92%.  Examination:  Physical Exam Constitutional:      Appearance: Normal appearance.  HENT:     Head: Normocephalic and atraumatic.     Mouth/Throat:     Mouth: Mucous membranes are moist.  Eyes:     Extraocular Movements: Extraocular movements intact.  Cardiovascular:     Rate and Rhythm: Normal rate.  Pulmonary:     Effort: Pulmonary effort is normal. No respiratory distress.     Breath sounds: No wheezing.     Comments: Coarse sounds bilaterally Abdominal:      General: Bowel sounds are normal. There is no distension.     Palpations: Abdomen is soft.     Tenderness: There is no abdominal tenderness.  Musculoskeletal:        General: Normal range of motion.     Cervical back: Normal range of motion and neck supple.     Right lower leg: Edema (1-2+) present.     Left lower leg: Edema (1-2+) present.  Skin:    General: Skin is warm and dry.  Neurological:     General: No focal deficit present.     Mental Status: She is alert.  Psychiatric:        Mood and Affect: Mood normal.      Consultants:    Procedures:    Data Reviewed: Results for orders placed or performed during the hospital encounter of 11/10/22 (from the past 24 hour(s))  Glucose, capillary     Status: Abnormal   Collection Time: 11/14/22  8:58 PM  Result Value Ref Range   Glucose-Capillary 125 (H) 70 - 99 mg/dL  Brain natriuretic peptide     Status: Abnormal  Collection Time: 11/15/22  5:24 AM  Result Value Ref Range   B Natriuretic Peptide 120.0 (H) 0.0 - 100.0 pg/mL  CBC with Differential/Platelet     Status: Abnormal   Collection Time: 11/15/22  5:24 AM  Result Value Ref Range   WBC 8.4 4.0 - 10.5 K/uL   RBC 4.59 3.87 - 5.11 MIL/uL   Hemoglobin 13.4 12.0 - 15.0 g/dL   HCT 95.6 21.3 - 08.6 %   MCV 95.2 80.0 - 100.0 fL   MCH 29.2 26.0 - 34.0 pg   MCHC 30.7 30.0 - 36.0 g/dL   RDW 57.8 (H) 46.9 - 62.9 %   Platelets 265 150 - 400 K/uL   nRBC 0.0 0.0 - 0.2 %   Neutrophils Relative % 83 %   Neutro Abs 6.9 1.7 - 7.7 K/uL   Lymphocytes Relative 9 %   Lymphs Abs 0.8 0.7 - 4.0 K/uL   Monocytes Relative 7 %   Monocytes Absolute 0.6 0.1 - 1.0 K/uL   Eosinophils Relative 1 %   Eosinophils Absolute 0.1 0.0 - 0.5 K/uL   Basophils Relative 0 %   Basophils Absolute 0.0 0.0 - 0.1 K/uL   Immature Granulocytes 0 %   Abs Immature Granulocytes 0.03 0.00 - 0.07 K/uL  Comprehensive metabolic panel     Status: Abnormal   Collection Time: 11/15/22  5:24 AM  Result Value Ref  Range   Sodium 142 135 - 145 mmol/L   Potassium 4.1 3.5 - 5.1 mmol/L   Chloride 99 98 - 111 mmol/L   CO2 33 (H) 22 - 32 mmol/L   Glucose, Bld 80 70 - 99 mg/dL   BUN 28 (H) 8 - 23 mg/dL   Creatinine, Ser 5.28 0.44 - 1.00 mg/dL   Calcium 9.5 8.9 - 41.3 mg/dL   Total Protein 7.2 6.5 - 8.1 g/dL   Albumin 3.4 (L) 3.5 - 5.0 g/dL   AST 18 15 - 41 U/L   ALT 19 0 - 44 U/L   Alkaline Phosphatase 68 38 - 126 U/L   Total Bilirubin 0.9 0.3 - 1.2 mg/dL   GFR, Estimated >24 >40 mL/min   Anion gap 10 5 - 15  Phosphorus     Status: None   Collection Time: 11/15/22  5:24 AM  Result Value Ref Range   Phosphorus 4.6 2.5 - 4.6 mg/dL  Magnesium     Status: Abnormal   Collection Time: 11/15/22  5:24 AM  Result Value Ref Range   Magnesium 2.6 (H) 1.7 - 2.4 mg/dL  Glucose, capillary     Status: None   Collection Time: 11/15/22  7:25 AM  Result Value Ref Range   Glucose-Capillary 92 70 - 99 mg/dL  Glucose, capillary     Status: Abnormal   Collection Time: 11/15/22 11:33 AM  Result Value Ref Range   Glucose-Capillary 112 (H) 70 - 99 mg/dL  Glucose, capillary     Status: Abnormal   Collection Time: 11/15/22  4:23 PM  Result Value Ref Range   Glucose-Capillary 122 (H) 70 - 99 mg/dL    I have reviewed pertinent nursing notes, vitals, labs, and images as necessary. I have ordered labwork to follow up on as indicated.  I have reviewed the last notes from staff over past 24 hours. I have discussed patient's care plan and test results with nursing staff, CM/SW, and other staff as appropriate.  Time spent: Greater than 50% of the 55 minute visit was spent in counseling/coordination of care  for the patient as laid out in the A&P.   LOS: 5 days   Lewie Chamber, MD [facility] 11/15/2022, 6:18 PM

## 2022-11-15 NOTE — Progress Notes (Signed)
After bolus, checked VS, BP stable, pt asymptomatic. Ambulated in hallway pt tolerated well, HR stable in 80's and oxygen stable [place] at 3 L/min. See flowsheet. Will continue to monitor.

## 2022-11-15 NOTE — Progress Notes (Signed)
   11/15/22 1956  BiPAP/CPAP/SIPAP  Reason BIPAP/CPAP not in use Non-compliant  BiPAP/CPAP /SiPAP Vitals  Resp 18  MEWS Score/Color  MEWS Score 1  MEWS Score Color Green

## 2022-11-16 DIAGNOSIS — I2721 Secondary pulmonary arterial hypertension: Secondary | ICD-10-CM | POA: Diagnosis not present

## 2022-11-16 DIAGNOSIS — I2609 Other pulmonary embolism with acute cor pulmonale: Secondary | ICD-10-CM

## 2022-11-16 DIAGNOSIS — I5032 Chronic diastolic (congestive) heart failure: Secondary | ICD-10-CM

## 2022-11-16 DIAGNOSIS — J9621 Acute and chronic respiratory failure with hypoxia: Secondary | ICD-10-CM | POA: Diagnosis not present

## 2022-11-16 DIAGNOSIS — J9622 Acute and chronic respiratory failure with hypercapnia: Secondary | ICD-10-CM | POA: Diagnosis not present

## 2022-11-16 DIAGNOSIS — E1159 Type 2 diabetes mellitus with other circulatory complications: Secondary | ICD-10-CM | POA: Diagnosis not present

## 2022-11-16 DIAGNOSIS — I5033 Acute on chronic diastolic (congestive) heart failure: Secondary | ICD-10-CM | POA: Diagnosis not present

## 2022-11-16 LAB — BASIC METABOLIC PANEL
Anion gap: 10 (ref 5–15)
BUN: 30 mg/dL — ABNORMAL HIGH (ref 8–23)
CO2: 31 mmol/L (ref 22–32)
Calcium: 9.2 mg/dL (ref 8.9–10.3)
Chloride: 99 mmol/L (ref 98–111)
Creatinine, Ser: 1.04 mg/dL — ABNORMAL HIGH (ref 0.44–1.00)
GFR, Estimated: 54 mL/min — ABNORMAL LOW (ref >60)
Glucose, Bld: 89 mg/dL (ref 70–99)
Potassium: 4.5 mmol/L (ref 3.5–5.1)
Sodium: 140 mmol/L (ref 135–145)

## 2022-11-16 LAB — GLUCOSE, CAPILLARY
Glucose-Capillary: 82 mg/dL (ref 70–99)
Glucose-Capillary: 84 mg/dL (ref 70–99)
Glucose-Capillary: 91 mg/dL (ref 70–99)

## 2022-11-16 LAB — MAGNESIUM: Magnesium: 2.5 mg/dL — ABNORMAL HIGH (ref 1.7–2.4)

## 2022-11-16 MED ORDER — TORSEMIDE 20 MG PO TABS: ORAL_TABLET | ORAL | Status: DC

## 2022-11-16 MED ORDER — CYANOCOBALAMIN 1000 MCG PO TABS: 1000.0000 ug | ORAL_TABLET | Freq: Every day | ORAL | Status: DC

## 2022-11-16 NOTE — Progress Notes (Signed)
Mobility Specialist - Progress Note   11/16/22 1014  Oxygen Therapy  O2 Device Nasal Cannula  O2 Flow Rate (L/min) 2 L/min  Mobility  Activity Ambulated with assistance in hallway  Level of Assistance Standby assist, set-up cues, supervision of patient - no hands on  Assistive Device Front wheel walker  Distance Ambulated (ft) 230 ft  Activity Response Tolerated well  Mobility Referral Yes  $Mobility charge 1 Mobility  Mobility Specialist Start Time (ACUTE ONLY) [id number]  Mobility Specialist Stop Time (ACUTE ONLY) 1013  Mobility Specialist Time Calculation (min) (ACUTE ONLY) 14 min   Pt received in bed and agreeable to mobility. Ambulation cut short d/t pt c/o feeling dizzy. Unable to obtain accurate SpO2 reading due to pulse ox malfunction. Upon returning to room, pt stated feeling better after sitting. No other complaints during session. Pt to recliner after session with all needs met. NT in room.    [facility]

## 2022-11-16 NOTE — Discharge Summary (Signed)
tortuous. No aortic dissection or acute aortic findings. Multi chamber cardiomegaly. No pericardial effusion. No central pulmonary embolus. Mediastinum/Nodes: No mediastinal or hilar adenopathy. Patulous esophagus but no wall thickening. Lungs/Pleura: Moderate right and small to moderate left pleural effusion. There is associated compressive atelectasis. Heterogeneous areas of ground-glass in both lungs may represent edema. Upper  Abdomen: Chronic splenic calcifications. No acute upper abdominal findings. Musculoskeletal: Chronic severe compression deformities of T7 and T8. Chronic moderate to severe compression deformities of T11 and T12. Chronic upper lumbar compression deformities. There are no acute or suspicious osseous abnormalities. Review of the MIP images confirms the above findings. IMPRESSION: 1. 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 2. Aortic atherosclerosis.  No acute aortic findings. 3. Multi chamber cardiomegaly. Moderate right and small to moderate left pleural effusions with associated compressive atelectasis. Heterogeneous areas of ground-glass in both lungs may represent edema. 4. Chronic compression deformities of T7, T8, T11, T12, and upper lumbar spine. Electronically Signed   By: Narda Rutherford M.D.   On: 11/11/2022 15:25   ECHOCARDIOGRAM COMPLETE  Result Date: 11/11/2022    ECHOCARDIOGRAM REPORT   Patient Name:   Lindsay [name] Date of Exam: 11/11/2022 Medical Rec #:  [id number]  Height:       68.0 in Accession #:    [id number] Weight:       239.4 lb Date of Birth:  Jun 03, 1940  BSA:          2.206 m Patient Age:    82 years   BP:           98/60 mmHg Patient Gender: F          HR:           85 bpm. Exam Location:  Inpatient Procedure: 2D Echo, Cardiac Doppler, Color Doppler and Intracardiac            Opacification Agent Indications:    Congestive Heart Failure I50.9                 Abnormal ECG R94.31                 Atrial Fibrillation I48.91  History:        Patient has prior history of Echocardiogram examinations, most                 recent 05/10/2021. CHF, COPD, Signs/Symptoms:Dyspnea; Risk                 Factors:Hypertension.  Sonographer:    Darlys Gales Referring Phys: [id number] Rudy Luhmann MANUEL ORTIZ IMPRESSIONS  1. Left ventricular ejection fraction, by estimation, is 55 to 60%. The left ventricle has normal function. The left ventricle has no regional wall motion abnormalities. There is mild  concentric left ventricular hypertrophy. Left ventricular diastolic parameters are indeterminate.  2. RV McConnell's Sign oted. Right ventricular systolic function is mildly reduced. The right ventricular size is severely enlarged.  3. Left atrial size was moderately dilated.  4. Right atrial size was moderately dilated.  5. A small pericardial effusion is present. The pericardial effusion is surrounding the apex.  6. The mitral valve was not well visualized. No evidence of mitral valve regurgitation. No evidence of mitral stenosis.  7. Tricuspid valve regurgitation is mild to moderate.  8. The aortic valve was not well visualized. There is mild calcification of the aortic valve. There is mild thickening of the aortic valve. Aortic valve regurgitation is not visualized. Comparison(s): Prior images reviewed side  tortuous. No aortic dissection or acute aortic findings. Multi chamber cardiomegaly. No pericardial effusion. No central pulmonary embolus. Mediastinum/Nodes: No mediastinal or hilar adenopathy. Patulous esophagus but no wall thickening. Lungs/Pleura: Moderate right and small to moderate left pleural effusion. There is associated compressive atelectasis. Heterogeneous areas of ground-glass in both lungs may represent edema. Upper  Abdomen: Chronic splenic calcifications. No acute upper abdominal findings. Musculoskeletal: Chronic severe compression deformities of T7 and T8. Chronic moderate to severe compression deformities of T11 and T12. Chronic upper lumbar compression deformities. There are no acute or suspicious osseous abnormalities. Review of the MIP images confirms the above findings. IMPRESSION: 1. 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 2. Aortic atherosclerosis.  No acute aortic findings. 3. Multi chamber cardiomegaly. Moderate right and small to moderate left pleural effusions with associated compressive atelectasis. Heterogeneous areas of ground-glass in both lungs may represent edema. 4. Chronic compression deformities of T7, T8, T11, T12, and upper lumbar spine. Electronically Signed   By: Narda Rutherford M.D.   On: 11/11/2022 15:25   ECHOCARDIOGRAM COMPLETE  Result Date: 11/11/2022    ECHOCARDIOGRAM REPORT   Patient Name:   Lindsay [name] Date of Exam: 11/11/2022 Medical Rec #:  [id number]  Height:       68.0 in Accession #:    [id number] Weight:       239.4 lb Date of Birth:  Jun 03, 1940  BSA:          2.206 m Patient Age:    82 years   BP:           98/60 mmHg Patient Gender: F          HR:           85 bpm. Exam Location:  Inpatient Procedure: 2D Echo, Cardiac Doppler, Color Doppler and Intracardiac            Opacification Agent Indications:    Congestive Heart Failure I50.9                 Abnormal ECG R94.31                 Atrial Fibrillation I48.91  History:        Patient has prior history of Echocardiogram examinations, most                 recent 05/10/2021. CHF, COPD, Signs/Symptoms:Dyspnea; Risk                 Factors:Hypertension.  Sonographer:    Darlys Gales Referring Phys: [id number] Rudy Luhmann MANUEL ORTIZ IMPRESSIONS  1. Left ventricular ejection fraction, by estimation, is 55 to 60%. The left ventricle has normal function. The left ventricle has no regional wall motion abnormalities. There is mild  concentric left ventricular hypertrophy. Left ventricular diastolic parameters are indeterminate.  2. RV McConnell's Sign oted. Right ventricular systolic function is mildly reduced. The right ventricular size is severely enlarged.  3. Left atrial size was moderately dilated.  4. Right atrial size was moderately dilated.  5. A small pericardial effusion is present. The pericardial effusion is surrounding the apex.  6. The mitral valve was not well visualized. No evidence of mitral valve regurgitation. No evidence of mitral stenosis.  7. Tricuspid valve regurgitation is mild to moderate.  8. The aortic valve was not well visualized. There is mild calcification of the aortic valve. There is mild thickening of the aortic valve. Aortic valve regurgitation is not visualized. Comparison(s): Prior images reviewed side  Physician Discharge Summary   Lindsay [name] [id number]:[id number] DOB: 11/16/1940 DOA: 11/10/2022  PCP: Tollie Eth, NP  Admit date: 11/10/2022 Discharge date: 11/16/2022  Admitted From: Home Disposition:  Home Discharging physician: Lewie Chamber, MD Barriers to discharge: none  Recommendations at discharge: Follow up with pulmonology and cardiology Repeat CBC and BMP If further hypotension, may need to adjust lopressor  Discharge Condition: stable CODE STATUS: Full Diet recommendation:  Diet Orders (From admission, onward)     Start     Ordered   11/16/22 0000  Diet - low sodium heart healthy        11/16/22 1529   11/16/22 0000  Diet Carb Modified        11/16/22 1529   11/10/22 1610  Diet heart healthy/carb modified Room service appropriate? Yes; Fluid consistency: Thin; Fluid restriction: 1200 mL Fluid  Diet effective now       Question Answer Comment  Diet-HS Snack? Nothing   Room service appropriate? Yes   Fluid consistency: Thin   Fluid restriction: 1200 mL Fluid      11/10/22 1611            Hospital Course: Lindsay [name] is a 82 y.o. female with medical history significant of diastolic CHF, pansinusitis, chronic respiratory failure on home oxygen, asthma, permanent atrial fibrillation, dysphagia, dyspnea, elevated troponin, hyperlipidemia, hypertension, hypokalemia, hyponatremia, LGSIL, moderate persistent asthma.  She presented with worsening dyspnea with associated wheezing, cough, and fatigue. She was started on lasix for CHF and treatment for suspected COPD exacerbation as well.   Assessment and Plan:  Acute on chronic respiratory failure with hypoxia and hypercapnia Acute on Chronic Diastolic CHF COPD exacerbation -Continuous pulse oximetry maintain O2 saturation greater than 90%; on 2L chronic O2 at home - s/p lasix; possibly overdiuresed given some hypoTN and dizziness now; small fluid bolus 10/23 with improvement in pressure - continue lopressor; might  need dose reduced but tolerating for now -Evaluated by cardiology recently but new findings on echo from recent visit; now with reduced RV systolic function, enlarged RV, and small pericardial effusion - with hypotension, dizziness, echo changes, and mildly increased O2 demand, would think possible PE but this was not noted on CTA chest on 11/11/22; also considered cardiac tamponade but effusion appears small per echo and not likely to cause symptoms - appreciate cardiology evaluation inpatient as well; seen on 10/24; stable for d/c home still and will resume her home torsemide at discharge  -Continue breathing treatments - s/p doxy inpatient and steroid course    HTN - continue lopressor for now - s/p diuresis on admission then developed hypoTN; small fluid bolus given 10/23 and improved   Hyperlipidemia associated with type 2 diabetes mellitus (HCC) -Continue Atorvastatin 40 mg p.o. daily.    Persistent Atrial Fibrillation (HCC) -Continue Apixaban 5 mg p.o. twice daily. - continue lopressor  Depression  GAD (generalized anxiety disorder) -Continue Lorazepam -C/w Sertraline 100 mg po Daily    Hypothyroidism -TSH level 0.370 -Not on Levothyroxine   Aortic Arch Aneurysm (HCC) -Obtained CTA chest while in the hospital and showed "Mild dilatation of the aortic arch a 4 cm, stable"   Normocytic Anemia - now back to normal range?  - B12 low and on supplementation  -Folate low/normal -Iron stores adequate  GERD/GI Prophylaxis -C/w Pantoprazole 20 mg po Daily   Obesity -Complicates overall prognosis and care -Estimated body mass index is 37.81 kg/m as calculated from the following:   Height as of this encounter: 5\' 8"  (  tortuous. No aortic dissection or acute aortic findings. Multi chamber cardiomegaly. No pericardial effusion. No central pulmonary embolus. Mediastinum/Nodes: No mediastinal or hilar adenopathy. Patulous esophagus but no wall thickening. Lungs/Pleura: Moderate right and small to moderate left pleural effusion. There is associated compressive atelectasis. Heterogeneous areas of ground-glass in both lungs may represent edema. Upper  Abdomen: Chronic splenic calcifications. No acute upper abdominal findings. Musculoskeletal: Chronic severe compression deformities of T7 and T8. Chronic moderate to severe compression deformities of T11 and T12. Chronic upper lumbar compression deformities. There are no acute or suspicious osseous abnormalities. Review of the MIP images confirms the above findings. IMPRESSION: 1. 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 2. Aortic atherosclerosis.  No acute aortic findings. 3. Multi chamber cardiomegaly. Moderate right and small to moderate left pleural effusions with associated compressive atelectasis. Heterogeneous areas of ground-glass in both lungs may represent edema. 4. Chronic compression deformities of T7, T8, T11, T12, and upper lumbar spine. Electronically Signed   By: Narda Rutherford M.D.   On: 11/11/2022 15:25   ECHOCARDIOGRAM COMPLETE  Result Date: 11/11/2022    ECHOCARDIOGRAM REPORT   Patient Name:   Lindsay [name] Date of Exam: 11/11/2022 Medical Rec #:  [id number]  Height:       68.0 in Accession #:    [id number] Weight:       239.4 lb Date of Birth:  Jun 03, 1940  BSA:          2.206 m Patient Age:    82 years   BP:           98/60 mmHg Patient Gender: F          HR:           85 bpm. Exam Location:  Inpatient Procedure: 2D Echo, Cardiac Doppler, Color Doppler and Intracardiac            Opacification Agent Indications:    Congestive Heart Failure I50.9                 Abnormal ECG R94.31                 Atrial Fibrillation I48.91  History:        Patient has prior history of Echocardiogram examinations, most                 recent 05/10/2021. CHF, COPD, Signs/Symptoms:Dyspnea; Risk                 Factors:Hypertension.  Sonographer:    Darlys Gales Referring Phys: [id number] Rudy Luhmann MANUEL ORTIZ IMPRESSIONS  1. Left ventricular ejection fraction, by estimation, is 55 to 60%. The left ventricle has normal function. The left ventricle has no regional wall motion abnormalities. There is mild  concentric left ventricular hypertrophy. Left ventricular diastolic parameters are indeterminate.  2. RV McConnell's Sign oted. Right ventricular systolic function is mildly reduced. The right ventricular size is severely enlarged.  3. Left atrial size was moderately dilated.  4. Right atrial size was moderately dilated.  5. A small pericardial effusion is present. The pericardial effusion is surrounding the apex.  6. The mitral valve was not well visualized. No evidence of mitral valve regurgitation. No evidence of mitral stenosis.  7. Tricuspid valve regurgitation is mild to moderate.  8. The aortic valve was not well visualized. There is mild calcification of the aortic valve. There is mild thickening of the aortic valve. Aortic valve regurgitation is not visualized. Comparison(s): Prior images reviewed side  Physician Discharge Summary   Lindsay [name] [id number]:[id number] DOB: 11/16/1940 DOA: 11/10/2022  PCP: Tollie Eth, NP  Admit date: 11/10/2022 Discharge date: 11/16/2022  Admitted From: Home Disposition:  Home Discharging physician: Lewie Chamber, MD Barriers to discharge: none  Recommendations at discharge: Follow up with pulmonology and cardiology Repeat CBC and BMP If further hypotension, may need to adjust lopressor  Discharge Condition: stable CODE STATUS: Full Diet recommendation:  Diet Orders (From admission, onward)     Start     Ordered   11/16/22 0000  Diet - low sodium heart healthy        11/16/22 1529   11/16/22 0000  Diet Carb Modified        11/16/22 1529   11/10/22 1610  Diet heart healthy/carb modified Room service appropriate? Yes; Fluid consistency: Thin; Fluid restriction: 1200 mL Fluid  Diet effective now       Question Answer Comment  Diet-HS Snack? Nothing   Room service appropriate? Yes   Fluid consistency: Thin   Fluid restriction: 1200 mL Fluid      11/10/22 1611            Hospital Course: Lindsay [name] is a 82 y.o. female with medical history significant of diastolic CHF, pansinusitis, chronic respiratory failure on home oxygen, asthma, permanent atrial fibrillation, dysphagia, dyspnea, elevated troponin, hyperlipidemia, hypertension, hypokalemia, hyponatremia, LGSIL, moderate persistent asthma.  She presented with worsening dyspnea with associated wheezing, cough, and fatigue. She was started on lasix for CHF and treatment for suspected COPD exacerbation as well.   Assessment and Plan:  Acute on chronic respiratory failure with hypoxia and hypercapnia Acute on Chronic Diastolic CHF COPD exacerbation -Continuous pulse oximetry maintain O2 saturation greater than 90%; on 2L chronic O2 at home - s/p lasix; possibly overdiuresed given some hypoTN and dizziness now; small fluid bolus 10/23 with improvement in pressure - continue lopressor; might  need dose reduced but tolerating for now -Evaluated by cardiology recently but new findings on echo from recent visit; now with reduced RV systolic function, enlarged RV, and small pericardial effusion - with hypotension, dizziness, echo changes, and mildly increased O2 demand, would think possible PE but this was not noted on CTA chest on 11/11/22; also considered cardiac tamponade but effusion appears small per echo and not likely to cause symptoms - appreciate cardiology evaluation inpatient as well; seen on 10/24; stable for d/c home still and will resume her home torsemide at discharge  -Continue breathing treatments - s/p doxy inpatient and steroid course    HTN - continue lopressor for now - s/p diuresis on admission then developed hypoTN; small fluid bolus given 10/23 and improved   Hyperlipidemia associated with type 2 diabetes mellitus (HCC) -Continue Atorvastatin 40 mg p.o. daily.    Persistent Atrial Fibrillation (HCC) -Continue Apixaban 5 mg p.o. twice daily. - continue lopressor  Depression  GAD (generalized anxiety disorder) -Continue Lorazepam -C/w Sertraline 100 mg po Daily    Hypothyroidism -TSH level 0.370 -Not on Levothyroxine   Aortic Arch Aneurysm (HCC) -Obtained CTA chest while in the hospital and showed "Mild dilatation of the aortic arch a 4 cm, stable"   Normocytic Anemia - now back to normal range?  - B12 low and on supplementation  -Folate low/normal -Iron stores adequate  GERD/GI Prophylaxis -C/w Pantoprazole 20 mg po Daily   Obesity -Complicates overall prognosis and care -Estimated body mass index is 37.81 kg/m as calculated from the following:   Height as of this encounter: 5\' 8"  (  Physician Discharge Summary   Lindsay [name] [id number]:[id number] DOB: 11/16/1940 DOA: 11/10/2022  PCP: Tollie Eth, NP  Admit date: 11/10/2022 Discharge date: 11/16/2022  Admitted From: Home Disposition:  Home Discharging physician: Lewie Chamber, MD Barriers to discharge: none  Recommendations at discharge: Follow up with pulmonology and cardiology Repeat CBC and BMP If further hypotension, may need to adjust lopressor  Discharge Condition: stable CODE STATUS: Full Diet recommendation:  Diet Orders (From admission, onward)     Start     Ordered   11/16/22 0000  Diet - low sodium heart healthy        11/16/22 1529   11/16/22 0000  Diet Carb Modified        11/16/22 1529   11/10/22 1610  Diet heart healthy/carb modified Room service appropriate? Yes; Fluid consistency: Thin; Fluid restriction: 1200 mL Fluid  Diet effective now       Question Answer Comment  Diet-HS Snack? Nothing   Room service appropriate? Yes   Fluid consistency: Thin   Fluid restriction: 1200 mL Fluid      11/10/22 1611            Hospital Course: Lindsay [name] is a 82 y.o. female with medical history significant of diastolic CHF, pansinusitis, chronic respiratory failure on home oxygen, asthma, permanent atrial fibrillation, dysphagia, dyspnea, elevated troponin, hyperlipidemia, hypertension, hypokalemia, hyponatremia, LGSIL, moderate persistent asthma.  She presented with worsening dyspnea with associated wheezing, cough, and fatigue. She was started on lasix for CHF and treatment for suspected COPD exacerbation as well.   Assessment and Plan:  Acute on chronic respiratory failure with hypoxia and hypercapnia Acute on Chronic Diastolic CHF COPD exacerbation -Continuous pulse oximetry maintain O2 saturation greater than 90%; on 2L chronic O2 at home - s/p lasix; possibly overdiuresed given some hypoTN and dizziness now; small fluid bolus 10/23 with improvement in pressure - continue lopressor; might  need dose reduced but tolerating for now -Evaluated by cardiology recently but new findings on echo from recent visit; now with reduced RV systolic function, enlarged RV, and small pericardial effusion - with hypotension, dizziness, echo changes, and mildly increased O2 demand, would think possible PE but this was not noted on CTA chest on 11/11/22; also considered cardiac tamponade but effusion appears small per echo and not likely to cause symptoms - appreciate cardiology evaluation inpatient as well; seen on 10/24; stable for d/c home still and will resume her home torsemide at discharge  -Continue breathing treatments - s/p doxy inpatient and steroid course    HTN - continue lopressor for now - s/p diuresis on admission then developed hypoTN; small fluid bolus given 10/23 and improved   Hyperlipidemia associated with type 2 diabetes mellitus (HCC) -Continue Atorvastatin 40 mg p.o. daily.    Persistent Atrial Fibrillation (HCC) -Continue Apixaban 5 mg p.o. twice daily. - continue lopressor  Depression  GAD (generalized anxiety disorder) -Continue Lorazepam -C/w Sertraline 100 mg po Daily    Hypothyroidism -TSH level 0.370 -Not on Levothyroxine   Aortic Arch Aneurysm (HCC) -Obtained CTA chest while in the hospital and showed "Mild dilatation of the aortic arch a 4 cm, stable"   Normocytic Anemia - now back to normal range?  - B12 low and on supplementation  -Folate low/normal -Iron stores adequate  GERD/GI Prophylaxis -C/w Pantoprazole 20 mg po Daily   Obesity -Complicates overall prognosis and care -Estimated body mass index is 37.81 kg/m as calculated from the following:   Height as of this encounter: 5\' 8"  (  Physician Discharge Summary   Lindsay [name] [id number] DOB: 11/16/1940 DOA: 11/10/2022  PCP: Tollie Eth, NP  Admit date: 11/10/2022 Discharge date: 11/16/2022  Admitted From: Home Disposition:  Home Discharging physician: Lewie Chamber, MD Barriers to discharge: none  Recommendations at discharge: Follow up with pulmonology and cardiology Repeat CBC and BMP If further hypotension, may need to adjust lopressor  Discharge Condition: stable CODE STATUS: Full Diet recommendation:  Diet Orders (From admission, onward)     Start     Ordered   11/16/22 0000  Diet - low sodium heart healthy        11/16/22 1529   11/16/22 0000  Diet Carb Modified        11/16/22 1529   11/10/22 1610  Diet heart healthy/carb modified Room service appropriate? Yes; Fluid consistency: Thin; Fluid restriction: 1200 mL Fluid  Diet effective now       Question Answer Comment  Diet-HS Snack? Nothing   Room service appropriate? Yes   Fluid consistency: Thin   Fluid restriction: 1200 mL Fluid      11/10/22 1611            Hospital Course: Lindsay [name] is a 82 y.o. female with medical history significant of diastolic CHF, pansinusitis, chronic respiratory failure on home oxygen, asthma, permanent atrial fibrillation, dysphagia, dyspnea, elevated troponin, hyperlipidemia, hypertension, hypokalemia, hyponatremia, LGSIL, moderate persistent asthma.  She presented with worsening dyspnea with associated wheezing, cough, and fatigue. She was started on lasix for CHF and treatment for suspected COPD exacerbation as well.   Assessment and Plan:  Acute on chronic respiratory failure with hypoxia and hypercapnia Acute on Chronic Diastolic CHF COPD exacerbation -Continuous pulse oximetry maintain O2 saturation greater than 90%; on 2L chronic O2 at home - s/p lasix; possibly overdiuresed given some hypoTN and dizziness now; small fluid bolus 10/23 with improvement in pressure - continue lopressor; might  need dose reduced but tolerating for now -Evaluated by cardiology recently but new findings on echo from recent visit; now with reduced RV systolic function, enlarged RV, and small pericardial effusion - with hypotension, dizziness, echo changes, and mildly increased O2 demand, would think possible PE but this was not noted on CTA chest on 11/11/22; also considered cardiac tamponade but effusion appears small per echo and not likely to cause symptoms - appreciate cardiology evaluation inpatient as well; seen on 10/24; stable for d/c home still and will resume her home torsemide at discharge  -Continue breathing treatments - s/p doxy inpatient and steroid course    HTN - continue lopressor for now - s/p diuresis on admission then developed hypoTN; small fluid bolus given 10/23 and improved   Hyperlipidemia associated with type 2 diabetes mellitus (HCC) -Continue Atorvastatin 40 mg p.o. daily.    Persistent Atrial Fibrillation (HCC) -Continue Apixaban 5 mg p.o. twice daily. - continue lopressor  Depression  GAD (generalized anxiety disorder) -Continue Lorazepam -C/w Sertraline 100 mg po Daily    Hypothyroidism -TSH level 0.370 -Not on Levothyroxine   Aortic Arch Aneurysm (HCC) -Obtained CTA chest while in the hospital and showed "Mild dilatation of the aortic arch a 4 cm, stable"   Normocytic Anemia - now back to normal range?  - B12 low and on supplementation  -Folate low/normal -Iron stores adequate  GERD/GI Prophylaxis -C/w Pantoprazole 20 mg po Daily   Obesity -Complicates overall prognosis and care -Estimated body mass index is 37.81 kg/m as calculated from the following:   Height as of this encounter: 5\' 8"  (  tortuous. No aortic dissection or acute aortic findings. Multi chamber cardiomegaly. No pericardial effusion. No central pulmonary embolus. Mediastinum/Nodes: No mediastinal or hilar adenopathy. Patulous esophagus but no wall thickening. Lungs/Pleura: Moderate right and small to moderate left pleural effusion. There is associated compressive atelectasis. Heterogeneous areas of ground-glass in both lungs may represent edema. Upper  Abdomen: Chronic splenic calcifications. No acute upper abdominal findings. Musculoskeletal: Chronic severe compression deformities of T7 and T8. Chronic moderate to severe compression deformities of T11 and T12. Chronic upper lumbar compression deformities. There are no acute or suspicious osseous abnormalities. Review of the MIP images confirms the above findings. IMPRESSION: 1. 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 2. Aortic atherosclerosis.  No acute aortic findings. 3. Multi chamber cardiomegaly. Moderate right and small to moderate left pleural effusions with associated compressive atelectasis. Heterogeneous areas of ground-glass in both lungs may represent edema. 4. Chronic compression deformities of T7, T8, T11, T12, and upper lumbar spine. Electronically Signed   By: Narda Rutherford M.D.   On: 11/11/2022 15:25   ECHOCARDIOGRAM COMPLETE  Result Date: 11/11/2022    ECHOCARDIOGRAM REPORT   Patient Name:   Lindsay [name] Date of Exam: 11/11/2022 Medical Rec #:  [id number]  Height:       68.0 in Accession #:    [id number] Weight:       239.4 lb Date of Birth:  Jun 03, 1940  BSA:          2.206 m Patient Age:    82 years   BP:           98/60 mmHg Patient Gender: F          HR:           85 bpm. Exam Location:  Inpatient Procedure: 2D Echo, Cardiac Doppler, Color Doppler and Intracardiac            Opacification Agent Indications:    Congestive Heart Failure I50.9                 Abnormal ECG R94.31                 Atrial Fibrillation I48.91  History:        Patient has prior history of Echocardiogram examinations, most                 recent 05/10/2021. CHF, COPD, Signs/Symptoms:Dyspnea; Risk                 Factors:Hypertension.  Sonographer:    Darlys Gales Referring Phys: [id number] Rudy Luhmann MANUEL ORTIZ IMPRESSIONS  1. Left ventricular ejection fraction, by estimation, is 55 to 60%. The left ventricle has normal function. The left ventricle has no regional wall motion abnormalities. There is mild  concentric left ventricular hypertrophy. Left ventricular diastolic parameters are indeterminate.  2. RV McConnell's Sign oted. Right ventricular systolic function is mildly reduced. The right ventricular size is severely enlarged.  3. Left atrial size was moderately dilated.  4. Right atrial size was moderately dilated.  5. A small pericardial effusion is present. The pericardial effusion is surrounding the apex.  6. The mitral valve was not well visualized. No evidence of mitral valve regurgitation. No evidence of mitral stenosis.  7. Tricuspid valve regurgitation is mild to moderate.  8. The aortic valve was not well visualized. There is mild calcification of the aortic valve. There is mild thickening of the aortic valve. Aortic valve regurgitation is not visualized. Comparison(s): Prior images reviewed side  Physician Discharge Summary   Lindsay [name] XLK:[id number] DOB: 11/16/1940 DOA: 11/10/2022  PCP: Tollie Eth, NP  Admit date: 11/10/2022 Discharge date: 11/16/2022  Admitted From: Home Disposition:  Home Discharging physician: Lewie Chamber, MD Barriers to discharge: none  Recommendations at discharge: Follow up with pulmonology and cardiology Repeat CBC and BMP If further hypotension, may need to adjust lopressor  Discharge Condition: stable CODE STATUS: Full Diet recommendation:  Diet Orders (From admission, onward)     Start     Ordered   11/16/22 0000  Diet - low sodium heart healthy        11/16/22 1529   11/16/22 0000  Diet Carb Modified        11/16/22 1529   11/10/22 1610  Diet heart healthy/carb modified Room service appropriate? Yes; Fluid consistency: Thin; Fluid restriction: 1200 mL Fluid  Diet effective now       Question Answer Comment  Diet-HS Snack? Nothing   Room service appropriate? Yes   Fluid consistency: Thin   Fluid restriction: 1200 mL Fluid      11/10/22 1611            Hospital Course: Lindsay [name] is a 82 y.o. female with medical history significant of diastolic CHF, pansinusitis, chronic respiratory failure on home oxygen, asthma, permanent atrial fibrillation, dysphagia, dyspnea, elevated troponin, hyperlipidemia, hypertension, hypokalemia, hyponatremia, LGSIL, moderate persistent asthma.  She presented with worsening dyspnea with associated wheezing, cough, and fatigue. She was started on lasix for CHF and treatment for suspected COPD exacerbation as well.   Assessment and Plan:  Acute on chronic respiratory failure with hypoxia and hypercapnia Acute on Chronic Diastolic CHF COPD exacerbation -Continuous pulse oximetry maintain O2 saturation greater than 90%; on 2L chronic O2 at home - s/p lasix; possibly overdiuresed given some hypoTN and dizziness now; small fluid bolus 10/23 with improvement in pressure - continue lopressor; might  need dose reduced but tolerating for now -Evaluated by cardiology recently but new findings on echo from recent visit; now with reduced RV systolic function, enlarged RV, and small pericardial effusion - with hypotension, dizziness, echo changes, and mildly increased O2 demand, would think possible PE but this was not noted on CTA chest on 11/11/22; also considered cardiac tamponade but effusion appears small per echo and not likely to cause symptoms - appreciate cardiology evaluation inpatient as well; seen on 10/24; stable for d/c home still and will resume her home torsemide at discharge  -Continue breathing treatments - s/p doxy inpatient and steroid course    HTN - continue lopressor for now - s/p diuresis on admission then developed hypoTN; small fluid bolus given 10/23 and improved   Hyperlipidemia associated with type 2 diabetes mellitus (HCC) -Continue Atorvastatin 40 mg p.o. daily.    Persistent Atrial Fibrillation (HCC) -Continue Apixaban 5 mg p.o. twice daily. - continue lopressor  Depression  GAD (generalized anxiety disorder) -Continue Lorazepam -C/w Sertraline 100 mg po Daily    Hypothyroidism -TSH level 0.370 -Not on Levothyroxine   Aortic Arch Aneurysm (HCC) -Obtained CTA chest while in the hospital and showed "Mild dilatation of the aortic arch a 4 cm, stable"   Normocytic Anemia - now back to normal range?  - B12 low and on supplementation  -Folate low/normal -Iron stores adequate  GERD/GI Prophylaxis -C/w Pantoprazole 20 mg po Daily   Obesity -Complicates overall prognosis and care -Estimated body mass index is 37.81 kg/m as calculated from the following:   Height as of this encounter: 5\' 8"  (

## 2022-11-16 NOTE — Consult Note (Addendum)
Cardiology Consultation   Patient ID: Jaqua Kopplin MRN: [id number]; DOB: 1940/02/28  Admit date: 11/10/2022 Date of Consult: 11/16/2022  PCP:  Tollie Eth, NP   [facility] Providers Cardiologist:  Chrystie Nose, MD   Patient Profile:   Lindsay [name] is a 82 y.o. 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  History of Present Illness:   Ms. [name] has had previous successful cardioversion and 09/13/2020 with conversion to normal sinus rhythm and had been lost in follow-up for a since then, plans have been to continue with conservative measures and control strategies.  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  Currently patient is being evaluated for worsening shortness of breath with wheezing, cough, fatigue.  She is being treated for acute on chronic respiratory failure thought secondary to mix of her chronic HFpEF and COPD exacerbation.  During this admission she was diuresed on IV Lasix 40 mg twice daily however no longer on this since 21st.  Cardiology has been asked to see due to new newly reduced RV systolic dysfunction and small pericardial effusion thought relating to her shortness of breath.  Discussed with patient and she feels like she is back at baseline and does not need any more IV diuretics.  She states that the peripheral edema that she has now is chronic for her and at baseline.  She denies being significantly short of breath and ambulated today.  She stated that she had some mild lightheadedness/dizziness but feels okay currently.  She even states that she feels that she would be safe to discharge  today.  No other significant complaints and denies any chest pain.  Most recent echocardiogram shows persistent interstitial edema or infiltrates.  Creatinine 1.04.  BUN 30.  BNP 120 downtrending from 360.  Past Medical History:  Diagnosis Date   Acute diastolic CHF (congestive heart failure) (HCC) 08/03/2020   Acute non-recurrent pansinusitis 10/21/2020   Acute on chronic respiratory failure with hypoxia (HCC) 08/02/2020   Asthma    Asthma exacerbation 08/02/2020   Atrial fibrillation (HCC)    Dysphagia 08/02/2020   Dyspnea 05/09/2021   Elevated troponin 09/14/2020   High cholesterol    HTN (hypertension)    Hyperkalemia 09/14/2020   Hyponatremia 08/02/2020   Low grade squamous intraepithelial lesion on cytologic smear of cervix (LGSIL) 12/02/2019   Moderate persistent asthma without complication 12/02/2019   Respiratory failure with hypoxia (HCC)    Sore throat 11/16/2020    Past Surgical History:  Procedure Laterality Date   CARDIOVERSION N/A 09/13/2020   Procedure: CARDIOVERSION;  Surgeon: Parke Poisson, MD;  Location: [facility] ENDOSCOPY;  Service: Cardiovascular;  Laterality: N/A;   ESOPHAGOGASTRODUODENOSCOPY N/A 08/05/2020   Procedure: ESOPHAGOGASTRODUODENOSCOPY (EGD);  Surgeon: Kathi Der, MD;  Location: [facility] ENDOSCOPY;  Service: Gastroenterology;  Laterality: N/A;     Inpatient Medications: Scheduled Meds:  apixaban  5 mg Oral BID   atorvastatin  40 mg Oral Daily   vitamin B-12  1,000 mcg Oral Daily   doxycycline  100 mg Oral Q12H   guaiFENesin  1,200 mg Oral BID   insulin aspart  0-20 Units Subcutaneous TID WC   ipratropium-albuterol  3 mL Nebulization BID  loratadine  10 mg Oral QHS   metoprolol tartrate  25 mg Oral BID   mirtazapine  45 mg Oral QHS   pantoprazole  20 mg Oral QHS   sertraline  100 mg Oral QHS   Continuous Infusions:  PRN Meds: acetaminophen **OR** acetaminophen, albuterol, LORazepam, ondansetron **OR** ondansetron (ZOFRAN)  IV  Allergies:    Allergies  Allergen Reactions   Acetaminophen-Codeine Other (See Comments)    Agitation   Naproxen-Esomeprazole Mg Diarrhea   Codeine Other (See Comments)    Hallucinations     Social History:   Social History   Socioeconomic History   Marital status: Divorced    Spouse name: Not on file   Number of children: Not on file   Years of education: Not on file   Highest education level: Not on file  Occupational History   Not on file  Tobacco Use   Smoking status: Former    Types: Cigarettes   Smokeless tobacco: Never   Tobacco comments:    Quit remotely 1970s  Vaping Use   Vaping status: Never Used  Substance and Sexual Activity   Alcohol use: Not Currently   Drug use: Never   Sexual activity: Not Currently  Other Topics Concern   Not on file  Social History Narrative   Not on file   Social Determinants of Health   Financial Resource Strain: Low Risk  (07/10/2022)   Overall Financial Resource Strain (CARDIA)    Difficulty of Paying Living Expenses: Not very hard  Food Insecurity: No Food Insecurity (11/10/2022)   Hunger Vital Sign    Worried About Running Out of Food in the Last Year: Never true    Ran Out of Food in the Last Year: Never true  Transportation Needs: Unmet Transportation Needs (11/10/2022)   PRAPARE - Transportation    Lack of Transportation (Medical): Yes    Lack of Transportation (Non-Medical): Yes  Physical Activity: Inactive (07/10/2022)   Exercise Vital Sign    Days of Exercise per Week: 0 days    Minutes of Exercise per Session: 0 min  Stress: Stress Concern Present (07/10/2022)   [employer] of Occupational Health - Occupational Stress Questionnaire    Feeling of Stress : To some extent  Social Connections: Unknown (07/10/2022)   Social Connection and Isolation Panel [NHANES]    Frequency of Communication with Friends and Family: Once a week    Frequency of Social Gatherings with Friends and Family: Once a week     Attends Religious Services: Not on Marketing executive or Organizations: No    Attends Banker Meetings: Never    Marital Status: Divorced  Catering manager Violence: Not At Risk (11/10/2022)   Humiliation, Afraid, Rape, and Kick questionnaire    Fear of Current or Ex-Partner: No    Emotionally Abused: No    Physically Abused: No    Sexually Abused: No    Family History:   Family History  Problem Relation Age of Onset   Breast cancer Sister    Hypertension Other      ROS:  Please see the history of present illness.  All other ROS reviewed and negative.     Physical Exam/Data:   Vitals:   11/16/22 0113 11/16/22 0435 11/16/22 0829 11/16/22 0957  BP: 104/71 101/68  105/64  Pulse: 80 65  68  Resp: 19 20    Temp: 98.5 F (36.9 C) 98.2 F (36.8 C)    TempSrc:  Oral Oral    SpO2: 99% 92% 94%   Weight:      Height:        Intake/Output Summary (Last 24 hours) at 11/16/2022 1102 Last data filed at 11/16/2022 1020 Gross per 24 hour  Intake 1180 ml  Output 1600 ml  Net -420 ml      11/15/2022    5:27 AM 11/14/2022    5:28 AM 11/13/2022    5:10 AM  Last 3 Weights  Weight (lbs) 244 lb 11.4 oz 248 lb 10.9 oz 240 lb 4.8 oz  Weight (kg) 111 kg 112.8 kg 109 kg     Body mass index is 37.21 kg/m.  General:  Well nourished, well developed, in no acute distres HEENT: normal Neck: no JVD Vascular: No carotid bruits; Distal pulses 2+ bilaterally Cardiac: Irregularly irregular, no murmurs Lungs: Crackles at may represent interstitial lung disease Abd: soft, nontender, no hepatomegaly  Ext: mild edema Musculoskeletal:  No deformities, BUE and BLE strength normal and equal Skin: warm and dry  Neuro:  CNs 2-12 intact, no focal abnormalities noted Psych:  Normal affect   EKG:  The EKG was personally reviewed and demonstrates: Atrial fibrillation heart rate in the 80s.  Nonspecific T wave changes.  Low voltage, poor R wave progression. Telemetry:   Telemetry was personally reviewed and demonstrates: Atrial fibrillation heart rate in the 80s  Relevant CV Studies: Echocardiogram 11/11/2022 1. Left ventricular ejection fraction, by estimation, is 55 to 60%. The  left ventricle has normal function. The left ventricle has no regional  wall motion abnormalities. There is mild concentric left ventricular  hypertrophy. Left ventricular diastolic  parameters are indeterminate.   2. RV McConnell's Sign oted. Right ventricular systolic function is  mildly reduced. The right ventricular size is severely enlarged.   3. Left atrial size was moderately dilated.   4. Right atrial size was moderately dilated.   5. A small pericardial effusion is present. The pericardial effusion is  surrounding the apex.   6. The mitral valve was not well visualized. No evidence of mitral valve  regurgitation. No evidence of mitral stenosis.   7. Tricuspid valve regurgitation is mild to moderate.   8. The aortic valve was not well visualized. There is mild calcification  of the aortic valve. There is mild thickening of the aortic valve. Aortic  valve regurgitation is not visualized.   Comparison(s): Prior images reviewed side by side. RV not well visualized  in prior study (basal function normal).   Laboratory Data:  High Sensitivity Troponin:   Recent Labs  Lab 11/10/22 0827  TROPONINIHS 7     Chemistry Recent Labs  Lab 11/14/22 0543 11/15/22 0524 11/16/22 0506  NA 142 142 140  K 3.8 4.1 4.5  CL 96* 99 99  CO2 34* 33* 31  GLUCOSE 94 80 89  BUN 31* 28* 30*  CREATININE 0.99 0.86 1.04*  CALCIUM 9.2 9.5 9.2  MG 2.5* 2.6* 2.5*  GFRNONAA 57* >60 54*  ANIONGAP 12 10 10     Recent Labs  Lab 11/13/22 0452 11/14/22 0543 11/15/22 0524  PROT 6.7 7.2 7.2  ALBUMIN 3.4* 3.5 3.4*  AST 17 18 18   ALT 18 19 19   ALKPHOS 69 73 68  BILITOT 0.7 0.9 0.9   Lipids No results for input(s): "CHOL", "TRIG", "HDL", "LABVLDL", "LDLCALC", "CHOLHDL" in the last  168 hours.  Hematology Recent Labs  Lab 11/13/22 0452 11/14/22 0543 11/15/22 0524  WBC 9.7 8.4 8.4  RBC 3.99  4.31 4.59  HGB 11.5* 12.4 13.4  HCT 37.5 40.8 43.7  MCV 94.0 94.7 95.2  MCH 28.8 28.8 29.2  MCHC 30.7 30.4 30.7  RDW 16.1* 16.0* 15.7*  PLT 238 250 265   Thyroid  Recent Labs  Lab 11/11/22 0542  TSH 0.370    BNP Recent Labs  Lab 11/10/22 0827 11/15/22 0524  BNP 360.2* 120.0*    DDimer No results for input(s): "DDIMER" in the last 168 hours.   Radiology/Studies:  DG CHEST PORT 1 VIEW  Result Date: 11/15/2022 CLINICAL DATA:  Shortness of breath EXAM: PORTABLE CHEST - 1 VIEW COMPARISON:  the previous day's study FINDINGS: Extensive perihilar and right upper lung opacities , and peripheral interstitial edema or infiltrates, largely stable. Probable bilateral effusions, left worse than right. Heart size upper limits normal. Visualized bones unremarkable. IMPRESSION: Persistent perihilar and peripheral interstitial edema or infiltrates. Electronically Signed   By: Corlis Leak M.D.   On: 11/15/2022 10:56   DG CHEST PORT 1 VIEW  Result Date: 11/14/2022 CLINICAL DATA:  Shortness of breath EXAM: PORTABLE CHEST 1 VIEW COMPARISON:  Chest radiograph 11/13/2022 FINDINGS: The cardiomediastinal silhouette is grossly unchanged. Bilateral pleural effusions with adjacent bibasilar airspace opacity as well as patchy opacities in the right upper lobe are overall unchanged. There is no definite pneumothorax There is no acute osseous abnormality. IMPRESSION: No significant interval change in lung aeration with unchanged small bilateral pleural effusions, bibasilar airspace opacities, and patchy right upper lobe opacities. Electronically Signed   By: Lesia Hausen M.D.   On: 11/14/2022 10:28   DG CHEST PORT 1 VIEW  Result Date: 11/13/2022 CLINICAL DATA:  Shortness of breath. EXAM: PORTABLE CHEST 1 VIEW COMPARISON:  November 12, 2022. FINDINGS: Stable cardiomegaly. Minimal bibasilar  subsegmental atelectasis is noted with small pleural effusions. Bony thorax is unremarkable. IMPRESSION: Minimal bibasilar subsegmental atelectasis with small pleural effusions. Electronically Signed   By: Lupita Raider M.D.   On: 11/13/2022 10:33     Assessment and Plan:   Chronic HFpEF Mild to moderate TR Early in her admission received IV Lasix however has been off this for several days now.  She states that she is not short of breath and feels back at baseline and that her peripheral edema is also at baseline.  She actually states that she feels safe to discharge.  Echocardiogram shows EF 55 to 60% with mild concentric LVH.  RV McConell sign noted with mildly reduced function and was severely enlarged.  Findings may be suggestive of acute PE, but may be related to underlying pulmonary disease, will last MD to comment on this.  Otherwise volume status appears to be okay for her. Labs may suggest sufficient diuresis given elevated BUN and creatinine.  May be able to transition tomorrow back to oral diuretics.  Additionally, BNP is trending down and is having some dizziness today.  She is taking 40 mg daily of torsemide at home Small pericardial effusion was noted surrounding the apex.  Given size, I do not think this is contributing to her symptoms.  Longstanding persistent atrial fibrillation She is rate controlled with heart rates in the 80s.  Asymptomatic and does well. Continue Eliquis 5 mg twice daily and Lopressor 25 mg twice daily  Hyperlipidemia Coronary calcifications on CT LDL 105 10 days ago.  Will increase atorvastatin 80 mg here.  Aneurysm of the aortic arch Noted on CT in April measuring 4 cm which was stable.  She is due for repeat CTA in  6 months  COPD exacerbation Maintaining home oxygen requirements 2 L.  Further treatment per primary team.  Chest x-ray showed persistent interstitial edema versus infiltrate.  Risk Assessment/Risk Scores:    New York Heart Association  (NYHA) Functional Class NYHA Class II  CHA2DS2-VASc Score = 6  1} This indicates a 9.7% annual risk of stroke. The patient's score is based upon: CHF History: 1 HTN History: 1 Diabetes History: 0 Stroke History: 0 Vascular Disease History: 1 Age Score: 2 Gender Score: 1    For questions or updates, please contact [facility] Please consult [email or web address] for contact info under    Signed, Abagail Kitchens, [name]  11/16/2022 11:02 AM   I have seen and examined the patient along with Abagail Kitchens, [name] .  I have reviewed the chart, notes and new data.  I agree with PA/NP's note.  Key new complaints: feels back to baseline Key examination changes: emphysematous chest, scanty dry basal crackles Key new findings / data: reviewed the echo. There is clear evidence of cor pulmonale and PAP is higher and RV is dysfunctional compared to 2023 echo. However the findings are not pathognomonic for pulmonary embolism and could also be explained by an episode of sudden hypoxemia due to COPD exacerbation. The pericardial effusion is not a significant abnormality.  PLAN: She is fully anticoagulated with Eliquis. Pulmonary embolism is therefore unlikely. Suspect RV dysfunction due to sudden increase in pulmonary vascular resistance with COPD exacerbation. She has improved. No plans for additional cardiac workup at this time.  Thurmon Fair, MD, [facility] [phone] 11/16/2022, 3:07 PM

## 2022-11-16 NOTE — TOC Transition Note (Signed)
Transition of Care [facility]) - CM/SW Discharge Note   Patient Details  Name: Lindsay [name] MRN: [id number] Date of Birth: 1940/09/03  Transition of Care [facility]) CM/SW Contact:  Lanier Clam, RN Phone Number: 11/16/2022, 3:13 PM   Clinical Narrative: d/c back to home-[place] living-HHPT/OT-[facility]-faxed orders-No HHC. Has own transport home.      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)        Social Determinants of Health (SDOH) Interventions SDOH Screenings   Food Insecurity: No Food Insecurity (11/10/2022)  Housing: Low Risk  (11/10/2022)  Transportation Needs: Unmet Transportation Needs (11/10/2022)  Utilities: Not At Risk (11/10/2022)  Depression (PHQ2-9): Low Risk  (07/11/2022)  Financial Resource Strain: Low Risk  (07/10/2022)  Physical Activity: Inactive (07/10/2022)  Social Connections: Unknown (07/10/2022)  Stress: Stress Concern Present (07/10/2022)  Tobacco Use: Medium Risk (11/10/2022)     Readmission Risk Interventions    05/12/2021    2:16 PM  Readmission Risk Prevention Plan  Transportation Screening Complete  PCP or Specialist Appt within 3-5 Days Complete  HRI or Home Care Consult Complete  Social Work Consult for Recovery Care Planning/Counseling Complete  Palliative Care Screening Not Applicable  Medication Review Oceanographer) Complete

## 2022-11-16 NOTE — Progress Notes (Signed)
Speech Language Pathology Treatment: Dysphagia  Patient Details Name: Lindsay [name] MRN: [id number] DOB: March 26, 1940 Today's Date: 11/16/2022 Time: 8295-6213 SLP Time Calculation (min) (ACUTE ONLY): 10 min  Assessment / Plan / Recommendation Clinical Impression  Pt greeted re: her swallow/esophageal precautions - She reports she is to dc today - Pt recalled 3/4 dysmotlity compensation strategies SLP reviewed with her 2 days ago. RSI *reflux symptom index* administered with pt scoring 28/45, highly indicative of laryngopharyngeal reflux. She advises she has not been given a reflux prudent diet information previously - therefore SLP provided written list and advised to avoid all MINT products. Pt states she is following her swallowing precautions and is finding it helpful to compensate. Encouraged pt to use flutter valve - but not immediately after eating meals due to risk of refluxing/aspirating. Using teach back, education to importance of oral care completed. Pt has a remarkable memory and is trying to follow precautions for [name]. No SLP follow up indicated - as all education completed.     HPI HPI: 82 year old female admitted to [name] with shortness of breath and fatigue.  Patient had history of COPD on 3 L of oxygen at baseline, CHF, hypertension, hyperlipidemia, coronary artery disease, PAF, asthma, hypothyroidism, GAD.  Patient was placed on bronchodilators, steroids and diuretics.  She reported to MD issues with swallowing multiple pills at once, sensing they were sticking -pointing to the distal esophagus causing significant discomfort.  Patient has known history of severe esophageal dysmotility with barium tablet halting at midesophagus suspected due to dysmotility and not narrowing per esophagram 05/25/2022.  She is on a PPI that she takes nightly.      SLP Plan  All goals met      Recommendations for follow up therapy are one component of a multi-disciplinary discharge planning  process, led by the attending physician.  Recommendations may be updated based on patient status, additional functional criteria and insurance authorization.    Recommendations  Diet recommendations: Regular;Thin liquid Liquids provided via: Cup;Straw Medication Administration: Whole meds with liquid Supervision: Patient able to self feed Compensations: Slow rate;Small sips/bites Postural Changes and/or Swallow Maneuvers: Out of bed for meals;Seated upright 90 degrees                  Oral care BID   None       All goals met    Rolena Infante, MS [facility] SLP Acute Rehab Services Office [phone]  Chales Abrahams  11/16/2022, 2:27 PM

## 2022-11-16 NOTE — Progress Notes (Signed)
Patient to be discharged to home this afternoon. All discharge teaching including all discharge Medications and schedules for these Medications reviewed with the Patient. Patient verbalizes understanding of all discharge instructions. Discharge AVS with the Patient at time of discharge

## 2022-11-16 NOTE — Plan of Care (Signed)
Monitor labs and vital signs. Continue oxygen at baseline of 2-3L via nasal cannula. Monitor po intake. Continue to check blood glucose before meals and at bedtime.  Assist with pericare after toiletling . Medicate for complaint of pain or anxiety as needed. Reinforce education. Continue current plan of care.

## 2022-11-17 ENCOUNTER — Other Ambulatory Visit (HOSPITAL_BASED_OUTPATIENT_CLINIC_OR_DEPARTMENT_OTHER): Payer: Self-pay | Admitting: Nurse Practitioner

## 2022-11-17 ENCOUNTER — Telehealth: Payer: Self-pay

## 2022-11-17 DIAGNOSIS — F411 Generalized anxiety disorder: Secondary | ICD-10-CM

## 2022-11-17 DIAGNOSIS — M62512 Muscle wasting and atrophy, not elsewhere classified, left shoulder: Secondary | ICD-10-CM | POA: Diagnosis not present

## 2022-11-17 DIAGNOSIS — R278 Other lack of coordination: Secondary | ICD-10-CM | POA: Diagnosis not present

## 2022-11-17 DIAGNOSIS — M62511 Muscle wasting and atrophy, not elsewhere classified, right shoulder: Secondary | ICD-10-CM | POA: Diagnosis not present

## 2022-11-17 DIAGNOSIS — R296 Repeated falls: Secondary | ICD-10-CM | POA: Diagnosis not present

## 2022-11-17 DIAGNOSIS — F3341 Major depressive disorder, recurrent, in partial remission: Secondary | ICD-10-CM

## 2022-11-17 NOTE — Telephone Encounter (Signed)
Last apt 08/01/22 next apt 11/23/22.

## 2022-11-17 NOTE — Transitions of Care (Post Inpatient/ED Visit) (Signed)
11/17/2022  Name: Lindsay [name] MRN: [id number] DOB: 26-Oct-1940  Today's TOC FU Call Status: Today's TOC FU Call Status:: Successful TOC FU Call Completed TOC FU Call Complete Date: 11/17/22 Patient's Name and Date of Birth confirmed.  Transition Care Management Follow-up Telephone Call Date of Discharge: 11/16/22 Discharge Facility: [facility]) Type of Discharge: Inpatient Admission Primary Inpatient Discharge Diagnosis:: "SOB" How have you been since you were released from the hospital?: Better (Pt states she is doing well-rested fairly well-getting ready to eat lunch, she has been up walking & moving around-wearing oxygen 2L/min via [place]-states sats have been in the 90s) Any questions or concerns?: No  Items Reviewed: Did you receive and understand the discharge instructions provided?: Yes Medications obtained,verified, and reconciled?: Yes (Medications Reviewed) (pt was not aware she was to begin taking Cyanocobalamin- states she will pick some up from OTC and begin taking as ordered) Any new allergies since your discharge?: No Dietary orders reviewed?: Yes Type of Diet Ordered:: low salt/heart healthy/carb modified Do you have support at home?: Yes People in Home: alone Name of Support/Comfort Primary Source: lives at [facility]-dauhgter checks on her as well  Medications Reviewed Today: Medications Reviewed Today     Reviewed by Charlyn Minerva, RN (Registered Nurse) on 11/17/22 at 1246  Med List Status: <None>   Medication Order Taking? Sig Documenting Provider Last Dose Status Informant  albuterol (PROVENTIL) (5 MG/ML) 0.5% nebulizer solution [id number] Yes Take 0.5 mLs by nebulization every 6 (six) hours as needed for wheezing or shortness of breath. [provider] Taking Active   alendronate (FOSAMAX) 70 MG tablet [id number] Yes Take 1 tablet (70 mg total) by mouth once a week. Take with a full glass of water on an empty stomach.  Patient  taking differently: Take 70 mg by mouth every Saturday. Take with a full glass of water on an empty stomach.   Tollie Eth, NP Taking Active Self  apixaban (ELIQUIS) 5 MG TABS tablet [id number] Yes Take 1 tablet (5 mg total) by mouth 2 (two) times daily. Alver Sorrow, NP Taking Active Self  atorvastatin (LIPITOR) 40 MG tablet [id number] Yes Take 1 tablet (40 mg total) by mouth daily. Alver Sorrow, NP Taking Active Self  Calcium-Magnesium-Vitamin D (CALCIUM 1200+D3 PO) [id number] Yes Take 1 tablet by mouth in the morning. [provider] Taking Active Self  cetirizine (ZYRTEC) 10 MG tablet [id number] Yes TAKE 1 TABLET BY MOUTH EVERY DAY  Patient taking differently: Take 10 mg by mouth at bedtime.   Martina Sinner, MD Taking Active Self  cyanocobalamin 1000 MCG tablet [id number]  Take 1 tablet (1,000 mcg total) by mouth daily. Lewie Chamber, MD  Active   Elastic Bandages & Supports (MEDICAL COMPRESSION [place]) MISC [id number]  Two pair knee high 30-25mmHg compression stockings. Sized XL. As covered by insurance for ICD-10: I50.33, R60.0. Tollie Eth, NP  Active   fluticasone (FLONASE) 50 MCG/ACT nasal spray [id number] Yes Place 1 spray into both nostrils 2 (two) times daily as needed for allergies or rhinitis. [provider] Taking Active Self  Fluticasone-Umeclidin-Vilant (TRELEGY ELLIPTA) 100-62.5-25 MCG/ACT AEPB [id number] Yes Inhale 1 puff into the lungs daily. Martina Sinner, MD Taking Active Self  guaiFENesin (MUCINEX) 600 MG 12 hr tablet [id number] Yes TAKE 1 TABLET BY MOUTH 2 TIMES DAILY  Patient taking differently: Take 600 mg by mouth 2 (two) times daily as needed for to loosen phlegm or cough.   Noemi Chapel, NP  Taking Active Self  ipratropium-albuterol (DUONEB) 0.5-2.5 (3) MG/3ML SOLN [id number] Yes Inhale 3 mLs into the lungs every 6 (six) hours as needed (wheezing). Martina Sinner, MD Taking Active Self  LORazepam (ATIVAN) 1 MG tablet [id number]  Yes Take 1 tablet (1 mg total) by mouth 2 (two) times daily as needed for anxiety.  Patient taking differently: Take 1 mg by mouth in the morning and at bedtime.   Tollie Eth, NP Taking Active Self  Menthol (COUGH DROPS) 5.8 MG LOZG [id number] Yes Use as directed 1 lozenge (5.8 mg total) in the mouth or throat as needed (for dry, sore, or scratchy throat). Tollie Eth, NP Taking Active Self  metoprolol tartrate (LOPRESSOR) 25 MG tablet [id number] Yes Take 25 mg by mouth in the morning and at bedtime. [provider] Taking Active Self  mirtazapine (REMERON) 45 MG tablet [id number] Yes Take 1 tablet (45 mg total) by mouth at bedtime. Tollie Eth, NP Taking Active Self  montelukast (SINGULAIR) 10 MG tablet [id number] Yes Take 10 mg by mouth at bedtime. [provider] Taking Active Self  Multiple Vitamin (MULTIVITAMIN WITH MINERALS) TABS tablet [id number] Yes Take 1 tablet by mouth daily with breakfast. [provider] Taking Active Self  OXYGEN [id number] Yes Inhale 2 L/min into the lungs continuous. [provider] Taking Active Self  pantoprazole (PROTONIX) 20 MG tablet [id number] Yes Take 1 tablet (20 mg total) by mouth daily. Tollie Eth, NP Taking Active Self  sertraline (ZOLOFT) 100 MG tablet [id number] Yes Take 1 tablet (100 mg total) by mouth at bedtime. Tollie Eth, NP Taking Active Self  Spacer/Aero-Holding Chambers (AEROCHAMBER MV) inhaler [id number] Yes Use as instructed Martina Sinner, MD Taking Active   torsemide (DEMADEX) 20 MG tablet [id number] Yes TAKE 2 TABLETS DAILY EVERY MORNING. TAKE AN ADDITIONAL 2 TABLETS AS NEEDED FOR WEIGHT GAIN OF 2 POUNDS OVERNIGHT OR 5 POUNDS IN ONE WEEK. Lewie Chamber, MD Taking Active             Home Care and Equipment/Supplies: Were Home Health Services Ordered?: Yes Name of Home Health Agency:: [facility] where pt resides-she is aware that should start working with her and knows how to  reach/contact them if needed Has Agency set up a time to come to your home?: No Any new equipment or medical supplies ordered?: NA  Functional Questionnaire: Do you need assistance with bathing/showering or dressing?: No Do you need assistance with meal preparation?: No Do you need assistance with eating?: No Do you have difficulty maintaining continence: No Do you need assistance with getting out of bed/getting out of a chair/moving?: No Do you have difficulty managing or taking your medications?: No  Follow up appointments reviewed: PCP Follow-up appointment confirmed?: Yes Date of PCP follow-up appointment?: 11/23/22 (care guide assisted with making PCP hosp f/u appt during call) Follow-up Provider: Llana Aliment Specialist [facility] Follow-up appointment confirmed?: No Reason Specialist Follow-Up Not Confirmed: Patient has Specialist Provider Number and will Call for Appointment (reviewed with pt to make f/u appts with cardiology and pulmonology-she will call the offices) Do you need transportation to your follow-up appointment?: No (pt states she uses transportation provided by ILF to get to appts) Do you understand care options if your condition(s) worsen?: Yes-patient verbalized understanding  SDOH Interventions Today    Flowsheet Row Most Recent Value  SDOH Interventions   Food Insecurity Interventions Intervention Not Indicated  Transportation Interventions Intervention Not Indicated  [pt voices she uses  transportation provided by ILF where she lives-denies needing other resources]       Antionette Fairy, RN,BSN,CCM RN Care Manager Transitions of Care  [place]/Population Health  Direct Phone: [phone] Toll Free: [phone] Fax: [phone]

## 2022-11-23 ENCOUNTER — Ambulatory Visit: Payer: Self-pay

## 2022-11-23 ENCOUNTER — Inpatient Hospital Stay: Payer: Medicare HMO | Admitting: Nurse Practitioner

## 2022-11-23 DIAGNOSIS — R278 Other lack of coordination: Secondary | ICD-10-CM | POA: Diagnosis not present

## 2022-11-23 DIAGNOSIS — M62511 Muscle wasting and atrophy, not elsewhere classified, right shoulder: Secondary | ICD-10-CM | POA: Diagnosis not present

## 2022-11-23 DIAGNOSIS — R296 Repeated falls: Secondary | ICD-10-CM | POA: Diagnosis not present

## 2022-11-23 DIAGNOSIS — M62512 Muscle wasting and atrophy, not elsewhere classified, left shoulder: Secondary | ICD-10-CM | POA: Diagnosis not present

## 2022-11-23 NOTE — Patient Instructions (Signed)
Visit Information  Thank you for taking time to visit with me today. Please don't hesitate to contact me if I can be of assistance to you.   Following are the goals we discussed today:   Goals Addressed             This Visit's Progress    RN Care Coordination Activities - further follow up needed       Care Coordination Interventions: Provided education on low sodium diet Reviewed Heart Failure Action Plan in depth and provided written copy Advised patient to weigh each morning after emptying bladder Discussed importance of daily weight and advised patient to weigh and record daily Reviewed role of diuretics in prevention of fluid overload and management of heart failure; Discussed the importance of keeping all appointments with provider Provided patient with basic written and verbal COPD education on self care/management/and exacerbation prevention Provided written and verbal instructions on pursed lip breathing and utilized returned demonstration as teach back Provided instruction about proper use of medications used for management of COPD including inhalers Advised patient to self assesses COPD action plan zone and make appointment with provider if in the yellow zone for 48 hours without improvement Provided education about and advised patient to utilize infection prevention strategies to reduce risk of respiratory infection Assisted patient with scheduling her post discharge follow up appointments with [facility] and [facility]  Reviewed and discussed with patient her upcoming scheduled PCP follow up with Enid Skeens NP scheduled for 11/28/22 @2 :00 PM         Our next appointment is by telephone on 12/08/22 at 10:30 AM  Please call the care guide team at [phone] if you need to cancel or reschedule your appointment.   If you are experiencing a Mental Health or Behavioral Health Crisis or need someone to talk to, please call  1-800-273-TALK (toll free, 24 hour hotline)  Patient verbalizes understanding of instructions and care plan provided today and agrees to view in MyChart. Active MyChart status and patient understanding of how to access instructions and care plan via MyChart confirmed with patient.     [name] RN BSN CCM [place]  [facility], [facility] Nurse Care Coordinator  Direct Dial: [phone] Website: [email or web address]

## 2022-11-23 NOTE — Patient Outreach (Signed)
[name] Coordination   Follow Up Visit Note   11/23/2022 Name: Lindsay [name] MRN: [id number] DOB: 1940/05/23  Lindsay [name] is a 82 y.o. year old female who sees Early, Sung Amabile, NP for primary [name]. I spoke with  Lindsay [name] by phone today.  What matters to the patients health and wellness today?  Patient would like to keep her breathing and CHF under good control and stay out of the hospital.    Goals Addressed             This Visit's Progress    RN [name] Coordination Activities - further follow up needed       [name] Coordination Interventions: Provided education on low sodium diet Reviewed Heart Failure Action Plan in depth and provided written copy Advised patient to weigh each morning after emptying bladder Discussed importance of daily weight and advised patient to weigh and record daily Reviewed role of diuretics in prevention of fluid overload and management of heart failure; Discussed the importance of keeping all appointments with provider Provided patient with basic written and verbal COPD education on self [name]/management/and exacerbation prevention Provided written and verbal instructions on pursed lip breathing and utilized returned demonstration as teach back Provided instruction about proper use of medications used for management of COPD including inhalers Advised patient to self assesses COPD action plan zone and make appointment with provider if in the yellow zone for 48 hours without improvement Provided education about and advised patient to utilize infection prevention strategies to reduce risk of respiratory infection Assisted patient with scheduling her post discharge follow up appointments with [name] and [name] at [name]  Reviewed and discussed with patient her upcoming scheduled PCP follow up with Lindsay Skeens NP scheduled for 11/28/22 @2 :00 PM    Interventions Today    Flowsheet Row Most Recent Value  Chronic Disease    Chronic disease during today's visit Chronic Obstructive Pulmonary Disease (COPD), Congestive Heart Failure (CHF)  General Interventions   General Interventions Discussed/Reviewed General Interventions Discussed, General Interventions Reviewed, Doctor Visits, Communication with  Doctor Visits Discussed/Reviewed Doctor Visits Discussed, Doctor Visits Reviewed, PCP, Specialist  Communication with PCP/Specialists  [name] [name],  [name]]  Education Interventions   Education Provided Provided Education  Provided Verbal Education On When to see the doctor, Medication, Nutrition  Nutrition Interventions   Nutrition Discussed/Reviewed Nutrition Discussed, Nutrition Reviewed  Pharmacy Interventions   Pharmacy Dicussed/Reviewed Pharmacy Topics Reviewed, Pharmacy Topics Discussed, Medications and their functions          SDOH assessments and interventions completed:  No     [name] Coordination Interventions:  Yes, provided   Follow up plan: Follow up call scheduled for 12/08/22 @10 :30 AM    Encounter Outcome:  Patient Visit Completed

## 2022-11-27 DIAGNOSIS — M62511 Muscle wasting and atrophy, not elsewhere classified, right shoulder: Secondary | ICD-10-CM | POA: Diagnosis not present

## 2022-11-27 DIAGNOSIS — M62512 Muscle wasting and atrophy, not elsewhere classified, left shoulder: Secondary | ICD-10-CM | POA: Diagnosis not present

## 2022-11-27 DIAGNOSIS — R278 Other lack of coordination: Secondary | ICD-10-CM | POA: Diagnosis not present

## 2022-11-27 DIAGNOSIS — R296 Repeated falls: Secondary | ICD-10-CM | POA: Diagnosis not present

## 2022-11-28 ENCOUNTER — Ambulatory Visit (INDEPENDENT_AMBULATORY_CARE_PROVIDER_SITE_OTHER): Payer: Medicare HMO | Admitting: Nurse Practitioner

## 2022-11-28 ENCOUNTER — Encounter: Payer: Self-pay | Admitting: Nurse Practitioner

## 2022-11-28 VITALS — BP 122/74 | HR 85 | Wt 231.0 lb

## 2022-11-28 DIAGNOSIS — D518 Other vitamin B12 deficiency anemias: Secondary | ICD-10-CM

## 2022-11-28 DIAGNOSIS — I4819 Other persistent atrial fibrillation: Secondary | ICD-10-CM | POA: Diagnosis not present

## 2022-11-28 DIAGNOSIS — I5032 Chronic diastolic (congestive) heart failure: Secondary | ICD-10-CM

## 2022-11-28 DIAGNOSIS — Z7901 Long term (current) use of anticoagulants: Secondary | ICD-10-CM

## 2022-11-28 DIAGNOSIS — F411 Generalized anxiety disorder: Secondary | ICD-10-CM | POA: Diagnosis not present

## 2022-11-28 DIAGNOSIS — Z09 Encounter for follow-up examination after completed treatment for conditions other than malignant neoplasm: Secondary | ICD-10-CM

## 2022-11-28 DIAGNOSIS — R5381 Other malaise: Secondary | ICD-10-CM

## 2022-11-28 DIAGNOSIS — Z9981 Dependence on supplemental oxygen: Secondary | ICD-10-CM

## 2022-11-28 DIAGNOSIS — R600 Localized edema: Secondary | ICD-10-CM

## 2022-11-28 DIAGNOSIS — R7301 Impaired fasting glucose: Secondary | ICD-10-CM | POA: Diagnosis not present

## 2022-11-28 DIAGNOSIS — F3341 Major depressive disorder, recurrent, in partial remission: Secondary | ICD-10-CM

## 2022-11-28 DIAGNOSIS — J441 Chronic obstructive pulmonary disease with (acute) exacerbation: Secondary | ICD-10-CM

## 2022-11-28 DIAGNOSIS — R0981 Nasal congestion: Secondary | ICD-10-CM | POA: Diagnosis not present

## 2022-11-28 DIAGNOSIS — R238 Other skin changes: Secondary | ICD-10-CM

## 2022-11-28 DIAGNOSIS — J9611 Chronic respiratory failure with hypoxia: Secondary | ICD-10-CM

## 2022-11-28 DIAGNOSIS — E876 Hypokalemia: Secondary | ICD-10-CM

## 2022-11-28 HISTORY — DX: Encounter for follow-up examination after completed treatment for conditions other than malignant neoplasm: Z09

## 2022-11-28 MED ORDER — POTASSIUM CHLORIDE CRYS ER 20 MEQ PO TBCR: 20.0000 meq | EXTENDED_RELEASE_TABLET | Freq: Every day | ORAL | 3 refills | Status: DC

## 2022-11-28 MED ORDER — B-12 1000 MCG SL SUBL
1.0000 | SUBLINGUAL_TABLET | Freq: Every day | SUBLINGUAL | 3 refills | Status: DC
Start: 2022-11-28 — End: 2023-08-28

## 2022-11-28 NOTE — Patient Instructions (Addendum)
Wear compression stockings while up during the day to help prevent swelling from getting back in your legs.   I have made a list of all of the medications you should be taking, what time you should take these, and what these are for.   Oxygen 2L all the time.  I will see if we can order compression stockings for you to be sent to your home from one of the medical companies so your insurance can cover these.

## 2022-11-28 NOTE — Progress Notes (Unsigned)
  Lindsay Clamp, DNP, AGNP-c [facility] [address] [place], [place] [address] Main Office [phone]  ESTABLISHED PATIENT- Hospital Follow-Up Visit  There were no vitals taken for this visit.  HPI  Lindsay [name]  is a 82 y.o. year old female presenting today for evaluation and management following hospitalization.   Date of Hospitalization: 11/10/2022 Admitted: Yes Primary Diagnosis: Acute on chronic respiratory failure with hypoxia and hypercapnia, acute on chronic CHF, COPD exacerbation, Afib New Medications: Yes: torsemide, b12 ,  D/C'd Chronic Medications: No New Providers: No Speciality F/U Scheduled: Yes Lab/Imaging Concerns that need further evaluation: Yes: CMP and BMP  -Adjust lopressor for ongoing hypotension -new findings on echo from recent visit; now with reduced RV systolic function, enlarged RV, and small pericardial effusion. 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

## 2022-11-29 ENCOUNTER — Encounter: Payer: Self-pay | Admitting: Nurse Practitioner

## 2022-11-29 DIAGNOSIS — M62512 Muscle wasting and atrophy, not elsewhere classified, left shoulder: Secondary | ICD-10-CM | POA: Diagnosis not present

## 2022-11-29 DIAGNOSIS — R296 Repeated falls: Secondary | ICD-10-CM | POA: Diagnosis not present

## 2022-11-29 DIAGNOSIS — M62511 Muscle wasting and atrophy, not elsewhere classified, right shoulder: Secondary | ICD-10-CM | POA: Diagnosis not present

## 2022-11-29 DIAGNOSIS — R0981 Nasal congestion: Secondary | ICD-10-CM | POA: Insufficient documentation

## 2022-11-29 DIAGNOSIS — R278 Other lack of coordination: Secondary | ICD-10-CM | POA: Diagnosis not present

## 2022-11-29 DIAGNOSIS — R238 Other skin changes: Secondary | ICD-10-CM | POA: Insufficient documentation

## 2022-11-29 HISTORY — DX: Nasal congestion: R09.81

## 2022-11-29 HISTORY — DX: Other skin changes: R23.8

## 2022-11-29 LAB — COMPREHENSIVE METABOLIC PANEL
ALT: 18 [IU]/L (ref 0–32)
AST: 23 [IU]/L (ref 0–40)
Albumin: 3.8 g/dL (ref 3.7–4.7)
Alkaline Phosphatase: 94 IU/L (ref 44–121)
BUN/Creatinine Ratio: 19 (ref 12–28)
BUN: 18 mg/dL (ref 8–27)
Bilirubin Total: 0.5 mg/dL (ref 0.0–1.2)
CO2: 29 mmol/L (ref 20–29)
Calcium: 9 mg/dL (ref 8.7–10.3)
Chloride: 96 mmol/L (ref 96–106)
Creatinine, Ser: 0.96 mg/dL (ref 0.57–1.00)
Globulin, Total: 2.5 g/dL (ref 1.5–4.5)
Glucose: 94 mg/dL (ref 70–99)
Potassium: 3.1 mmol/L — ABNORMAL LOW (ref 3.5–5.2)
Sodium: 142 mmol/L (ref 134–144)
Total Protein: 6.3 g/dL (ref 6.0–8.5)
eGFR: 59 mL/min/{1.73_m2} — ABNORMAL LOW (ref >59)

## 2022-11-29 LAB — CBC WITH DIFFERENTIAL/PLATELET
Basophils Absolute: 0 10*3/uL (ref 0.0–0.2)
Basos: 1 %
EOS (ABSOLUTE): 0.1 10*3/uL (ref 0.0–0.4)
Eos: 1 %
Hematocrit: 37.2 % (ref 34.0–46.6)
Hemoglobin: 12.3 g/dL (ref 11.1–15.9)
Immature Grans (Abs): 0 10*3/uL (ref 0.0–0.1)
Immature Granulocytes: 1 %
Lymphocytes Absolute: 1 10*3/uL (ref 0.7–3.1)
Lymphs: 11 %
MCH: 29.4 pg (ref 26.6–33.0)
MCHC: 33.1 g/dL (ref 31.5–35.7)
MCV: 89 fL (ref 79–97)
Monocytes Absolute: 0.6 10*3/uL (ref 0.1–0.9)
Monocytes: 7 %
Neutrophils Absolute: 6.7 10*3/uL (ref 1.4–7.0)
Neutrophils: 79 %
Platelets: 255 10*3/uL (ref 150–450)
RBC: 4.18 x10E6/uL (ref 3.77–5.28)
RDW: 15.1 % (ref 11.7–15.4)
WBC: 8.4 10*3/uL (ref 3.4–10.8)

## 2022-11-29 MED ORDER — BLOOD GLUCOSE TEST VI STRP: ORAL_STRIP | 99 refills | Status: DC

## 2022-11-29 MED ORDER — BLOOD GLUCOSE MONITORING SUPPL DEVI: 1.0000 | Freq: Three times a day (TID) | 0 refills | Status: DC

## 2022-11-29 MED ORDER — LANCET DEVICE MISC: 99 refills | Status: DC

## 2022-11-29 MED ORDER — AZITHROMYCIN 250 MG PO TABS: ORAL_TABLET | ORAL | 0 refills | Status: AC

## 2022-11-29 MED ORDER — LANCETS MISC. MISC: 99 refills | Status: DC

## 2022-11-29 NOTE — Assessment & Plan Note (Signed)
Chronic. Previous consistent improvement with the use of potassium supplement daily while on diuretic. Will plan to restart this as she is still on the diuretic on a daily basis. Will monitor labs in a few weeks.

## 2022-11-29 NOTE — Assessment & Plan Note (Signed)
Review of hospitalization, medication changes, labs, and new directions for care. All questions answered.

## 2022-11-29 NOTE — Assessment & Plan Note (Signed)
Chronic, significant anxiety with chronic use of benzodiazepines for many years. Previous attempts at weaning unsuccessful. She did experience a time without this in the hospital and reports significant anxiety symptoms. Given her chronic breathing issues and chronic use of the medication I do not recommend cessation of the medication at this time.

## 2022-11-29 NOTE — Assessment & Plan Note (Signed)
Glucometer sent in for patient. She was on insulin during her recent hospital stay and experienced high blood sugar levels. We will recheck the labs today.

## 2022-11-29 NOTE — Assessment & Plan Note (Signed)
Sinus pressure with congestion present. Will start azithromycin in an effort to avoid worsening illness and prevention of COPD flair.

## 2022-11-29 NOTE — Assessment & Plan Note (Signed)
PT initiated at ALF. Recommend continuation of this for strengthening and improved gait.

## 2022-11-29 NOTE — Assessment & Plan Note (Signed)
Chronic. No alarm symptoms are present at this time. No crackles or LE edema present. Torsemide appears to be helping significantly. Plan to continue current therapy.

## 2022-11-29 NOTE — Assessment & Plan Note (Signed)
Patient reports improved breathing but now requires continuous oxygen at 2L [name]. No wheezing noted. No distress.  -Continue current respiratory medications including Trelegy and mucinex daily.  Ipratropium Albuterol (DuoNeb) and albuterol nebulizer as needed, and Fluticasone (Flonase) nasal spray as needed. -Continue oxygen therapy as currently prescribed. -Start physical therapy. -If oxygen demands increase or you  begin to feel like your shortness of breath is worse, call me immediately.

## 2022-11-29 NOTE — Assessment & Plan Note (Signed)
Now requiring oxygen therapy continuously. No alarm symptoms are present. She seems to have shown improvement in her acute state but a new baseline at this time. We will continue current medications and close monitoring. PT for deconditioning has been ordered.

## 2022-11-29 NOTE — Assessment & Plan Note (Signed)
>>  ASSESSMENT AND PLAN FOR RECURRENT MAJOR DEPRESSIVE DISORDER, IN PARTIAL REMISSION WRITTEN ON 11/29/2022  8:38 AM BY Desha Bitner E, NP  Chronic. Stable. No concerns. Continue current regimen.

## 2022-11-29 NOTE — Assessment & Plan Note (Signed)
Unclear if this is concerning. Given the coloration, recommend pathology. Area scraped and papule sent for examination. If needed will complete a punch biopsy, but this was not possible due to time constraints with the visit.

## 2022-11-29 NOTE — Assessment & Plan Note (Signed)
Noted low B12 level (163). Patient reports fatigue. Recently received B12 injection in hospital. Consider deficiency a causative factor for her ongoing fatigue.  -Start B12 1000mg  daily. -Will recheck in 3 months and consider injections if this remains low.

## 2022-11-29 NOTE — Assessment & Plan Note (Signed)
No signs of bleeding or concerns present.

## 2022-11-29 NOTE — Assessment & Plan Note (Signed)
Chronic. Stable. No concerns. Continue current regimen.

## 2022-11-29 NOTE — Assessment & Plan Note (Signed)
Significant improvement of edema with diuresis. We discussed the importance of wearing her compression stockings during the day while she is up and moving around, but taking these off at bedtime. Recommend elevating the legs everytime she is seated.

## 2022-11-29 NOTE — Assessment & Plan Note (Signed)
>>  ASSESSMENT AND PLAN FOR ELEVATED FASTING BLOOD SUGAR WRITTEN ON 11/29/2022  8:33 AM BY Tej Murdaugh E, NP  Glucometer sent in for patient. She was on insulin  during her recent hospital stay and experienced high blood sugar levels. We will recheck the labs today.

## 2022-11-29 NOTE — Assessment & Plan Note (Signed)
>>  ASSESSMENT AND PLAN FOR GENERALIZED ANXIETY DISORDER WRITTEN ON 11/29/2022  8:36 AM BY Emersen Mascari E, NP  Chronic, significant anxiety with chronic use of benzodiazepines for many years. Previous attempts at weaning unsuccessful. She did experience a time without this in the hospital and reports significant anxiety symptoms. Given her chronic breathing issues and chronic use of the medication I do not recommend cessation of the medication at this time.

## 2022-11-29 NOTE — Assessment & Plan Note (Signed)
Exacerbation of chronic illness with progression of disease present now requiring oxygen therapy on continuous basis, but appears stable at this time. We discussed the importance of monitoring for worsening breathing and her medication management. Continue current treatment options and close monitoring. Azithromycin sent today for new sinus congestion. I am hopeful this will prevent worsening of her symptoms.

## 2022-11-29 NOTE — Assessment & Plan Note (Signed)
Chronic. Stable. Currently on eliquis twice a day. No alarm symptoms.  -Continue current management.

## 2022-11-30 LAB — ANATOMIC PATHOLOGY REPORT

## 2022-12-01 DIAGNOSIS — M62512 Muscle wasting and atrophy, not elsewhere classified, left shoulder: Secondary | ICD-10-CM | POA: Diagnosis not present

## 2022-12-01 DIAGNOSIS — R296 Repeated falls: Secondary | ICD-10-CM | POA: Diagnosis not present

## 2022-12-01 DIAGNOSIS — M62511 Muscle wasting and atrophy, not elsewhere classified, right shoulder: Secondary | ICD-10-CM | POA: Diagnosis not present

## 2022-12-01 DIAGNOSIS — R278 Other lack of coordination: Secondary | ICD-10-CM | POA: Diagnosis not present

## 2022-12-04 ENCOUNTER — Telehealth: Payer: Self-pay | Admitting: Licensed Clinical Social Worker

## 2022-12-04 ENCOUNTER — Encounter: Payer: Self-pay | Admitting: Licensed Clinical Social Worker

## 2022-12-04 NOTE — Patient Outreach (Signed)
  Care Coordination   12/04/2022 Name: Lindsay [name] MRN: [id number] DOB: Apr 05, 1940   Care Coordination Outreach Attempts:  An unsuccessful telephone outreach was attempted for a scheduled appointment today.  Follow Up Plan:  Additional outreach attempts will be made to offer the patient care coordination information and services.   Encounter Outcome:  No Answer   Care Coordination Interventions:  No, not indicated    [name], MSW, LCSW [facility] Care Management [place]  [facility] [email or web address] Phone [phone] 4:15 PM

## 2022-12-05 DIAGNOSIS — R296 Repeated falls: Secondary | ICD-10-CM | POA: Diagnosis not present

## 2022-12-05 DIAGNOSIS — R278 Other lack of coordination: Secondary | ICD-10-CM | POA: Diagnosis not present

## 2022-12-05 DIAGNOSIS — M62512 Muscle wasting and atrophy, not elsewhere classified, left shoulder: Secondary | ICD-10-CM | POA: Diagnosis not present

## 2022-12-05 DIAGNOSIS — M62511 Muscle wasting and atrophy, not elsewhere classified, right shoulder: Secondary | ICD-10-CM | POA: Diagnosis not present

## 2022-12-07 ENCOUNTER — Telehealth: Payer: Self-pay | Admitting: Licensed Clinical Social Worker

## 2022-12-07 ENCOUNTER — Encounter (HOSPITAL_COMMUNITY): Payer: Self-pay | Admitting: Physical Therapy

## 2022-12-07 DIAGNOSIS — R296 Repeated falls: Secondary | ICD-10-CM | POA: Diagnosis not present

## 2022-12-07 DIAGNOSIS — R278 Other lack of coordination: Secondary | ICD-10-CM | POA: Diagnosis not present

## 2022-12-07 DIAGNOSIS — R2689 Other abnormalities of gait and mobility: Secondary | ICD-10-CM | POA: Diagnosis not present

## 2022-12-07 DIAGNOSIS — M6281 Muscle weakness (generalized): Secondary | ICD-10-CM | POA: Diagnosis not present

## 2022-12-07 DIAGNOSIS — M62511 Muscle wasting and atrophy, not elsewhere classified, right shoulder: Secondary | ICD-10-CM | POA: Diagnosis not present

## 2022-12-07 DIAGNOSIS — M62512 Muscle wasting and atrophy, not elsewhere classified, left shoulder: Secondary | ICD-10-CM | POA: Diagnosis not present

## 2022-12-07 NOTE — Therapy (Signed)
PHYSICAL THERAPY DISCHARGE SUMMARY  Visits from Start of Care: 6  Current functional level related to goals / functional outcomes: Decreased size and improved granulation   Remaining deficits: Small wound remains   Education / Equipment: Wound care   Patient agrees to discharge. Patient goals were partially met. Patient is being discharged due to not returning since the last visit.  Virgina Organ, PT CLT [phone]

## 2022-12-07 NOTE — Patient Outreach (Signed)
  Care Coordination   Initial Visit Note   12/05/2022 Name: Lindsay [name] MRN: [id number] DOB: 09-20-1940  Lindsay [name] is a 82 y.o. year old female who sees Early, Sung Amabile, NP for primary care. I spoke with  Lindsay [name] by phone today.  What matters to the patients health and wellness today?  Patient agreed to r/s initial appt with LCSW   SDOH assessments and interventions completed:  No     Care Coordination Interventions:  Yes, provided  Interventions Today    Flowsheet Row Most Recent Value  Chronic Disease   Chronic disease during today's visit Diabetes, Hypertension (HTN), Atrial Fibrillation (AFib), Other  [MDD and GAD]  General Interventions   General Interventions Discussed/Reviewed General Interventions Discussed  [LCSW introduced self and explained role in complex care coordination. Pt agreed to r/s initial appt]       Follow up plan: Follow up call scheduled for 12/11/22    Encounter Outcome:  Patient Scheduled   Lindsay [name], MSW, LCSW [facility] Care Management [facility]  [facility] [name] Phone [phone] 9:17 AM

## 2022-12-08 ENCOUNTER — Ambulatory Visit: Payer: Self-pay

## 2022-12-08 DIAGNOSIS — M62512 Muscle wasting and atrophy, not elsewhere classified, left shoulder: Secondary | ICD-10-CM | POA: Diagnosis not present

## 2022-12-08 DIAGNOSIS — R296 Repeated falls: Secondary | ICD-10-CM | POA: Diagnosis not present

## 2022-12-08 DIAGNOSIS — R278 Other lack of coordination: Secondary | ICD-10-CM | POA: Diagnosis not present

## 2022-12-08 DIAGNOSIS — M62511 Muscle wasting and atrophy, not elsewhere classified, right shoulder: Secondary | ICD-10-CM | POA: Diagnosis not present

## 2022-12-11 ENCOUNTER — Ambulatory Visit: Payer: Self-pay | Admitting: Licensed Clinical Social Worker

## 2022-12-11 DIAGNOSIS — R296 Repeated falls: Secondary | ICD-10-CM | POA: Diagnosis not present

## 2022-12-11 DIAGNOSIS — M62511 Muscle wasting and atrophy, not elsewhere classified, right shoulder: Secondary | ICD-10-CM | POA: Diagnosis not present

## 2022-12-11 DIAGNOSIS — R278 Other lack of coordination: Secondary | ICD-10-CM | POA: Diagnosis not present

## 2022-12-11 DIAGNOSIS — M62512 Muscle wasting and atrophy, not elsewhere classified, left shoulder: Secondary | ICD-10-CM | POA: Diagnosis not present

## 2022-12-11 NOTE — Patient Outreach (Signed)
  Care Coordination   Follow Up Visit Note   12/08/2022 Name: Lindsay [name] MRN: [id number] DOB: 1940/09/30  Lindsay [name] is a 82 y.o. year old female who sees Early, Sung Amabile, NP for primary care. I spoke with  Lindsay [name] by phone today.  What matters to the patients health and wellness today?  Patient would like to continue to manage her CHF and COPD and stay out of the hospital.     Goals Addressed             This Visit's Progress    RN Care Coordination Activities - further follow up needed   On track    Care Coordination Interventions: Evaluation of current treatment plan related to CHF and COPD and patient's adherence to plan as established by provider Patient will keep her post discharge follow up appointments with [place] [facility] and [facility] at [facility]  Patient will continue to wear her Oxygen as directed by her daughter Patient will continue to follow her Heart Failure Action Plan as directed Patient will use her inhalers as instructed to help manage her COPD Patient will continue to ambulate with her walker and use fall precautions Patient will contact her health care provider to report new symptoms or concerns  Discussed plans with patient for ongoing care coordination follow up and provided patient with direct contact information for nurse care coordinator    Interventions Today    Flowsheet Row Most Recent Value  Chronic Disease   Chronic disease during today's visit Congestive Heart Failure (CHF), Chronic Obstructive Pulmonary Disease (COPD), Diabetes  General Interventions   General Interventions Discussed/Reviewed General Interventions Discussed, General Interventions Reviewed, Doctor Visits, Durable Medical Equipment (DME)  Doctor Visits Discussed/Reviewed Doctor Visits Discussed, Doctor Visits Reviewed, PCP, Specialist  Durable Medical Equipment (DME) Oxygen, Other  [emergency alert pendant]  Exercise Interventions   Exercise  Discussed/Reviewed Physical Activity, Exercise Reviewed, Exercise Discussed  Physical Activity Discussed/Reviewed Physical Activity Discussed, Physical Activity Reviewed, Types of exercise, Home Exercise Program (HEP)  Education Interventions   Education Provided Provided Education, Provided Printed Education  Provided Verbal Education On Nutrition, Exercise, When to see the doctor, Mental Health/Coping with Illness  Mental Health Interventions   Mental Health Discussed/Reviewed Mental Health Discussed, Mental Health Reviewed, Coping Strategies, Anxiety  Nutrition Interventions   Nutrition Discussed/Reviewed Nutrition Discussed, Nutrition Reviewed, Decreasing salt, Carbohydrate meal planning, Portion sizes, Adding fruits and vegetables  Pharmacy Interventions   Pharmacy Dicussed/Reviewed Pharmacy Topics Discussed, Pharmacy Topics Reviewed, Affording Medications  Safety Interventions   Safety Discussed/Reviewed Home Safety, Fall Risk, Safety Reviewed, Safety Discussed  Home Safety Assistive Devices          SDOH assessments and interventions completed:  Yes  SDOH Interventions Today    Flowsheet Row Most Recent Value  SDOH Interventions   Food Insecurity Interventions Intervention Not Indicated  Housing Interventions Intervention Not Indicated  Transportation Interventions AMB Referral  [call scheduled with [name] LCSW on 12/11/22]  Utilities Interventions Intervention Not Indicated  Physical Activity Interventions Other (Comments)  [patient is working with PT three times weekly]  Stress Interventions Intervention Not Indicated        Care Coordination Interventions:  Yes, provided   Follow up plan: Follow up call scheduled for 01/05/23 @9 :00 AM    Encounter Outcome:  Patient Visit Completed

## 2022-12-11 NOTE — Patient Instructions (Addendum)
Visit Information  Thank you for taking time to visit with me today. Please don't hesitate to contact me if I can be of assistance to you.   Following are the goals we discussed today:   Goals Addressed             This Visit's Progress    RN Care Coordination Activities - further follow up needed   On track    Care Coordination Interventions: Evaluation of current treatment plan related to CHF and COPD and patient's adherence to plan as established by provider Patient will keep her post discharge follow up appointments with [place] [facility] and [facility] at [facility]  Patient will continue to wear her Oxygen as directed  Patient will continue to follow her Heart Failure Action Plan as directed Patient will use her inhalers as instructed to help manage her COPD Patient will continue to ambulate with her walker and use fall precautions Patient will contact her health care provider to report new symptoms or concerns  Discussed plans with patient for ongoing care coordination follow up and provided patient with direct contact information for nurse care coordinator        Our next appointment is by telephone on 01/05/23 at 09:00 AM  Please call the care guide team at [phone] if you need to cancel or reschedule your appointment.   If you are experiencing a Mental Health or Behavioral Health Crisis or need someone to talk to, please call 1-800-273-TALK (toll free, 24 hour hotline)  Patient verbalizes understanding of instructions and care plan provided today and agrees to view in MyChart. Active MyChart status and patient understanding of how to access instructions and care plan via MyChart confirmed with patient.     [name] RN BSN CCM [place]  [facility] Health Nurse Care Coordinator  Direct Dial: [phone] Website: [email or web address]

## 2022-12-12 ENCOUNTER — Telehealth: Payer: Self-pay | Admitting: Internal Medicine

## 2022-12-12 ENCOUNTER — Inpatient Hospital Stay: Payer: Medicare HMO | Admitting: Student in an Organized Health Care Education/Training Program

## 2022-12-12 NOTE — Patient Instructions (Signed)
Visit Information  Thank you for taking time to visit with me today. Please don't hesitate to contact me if I can be of assistance to you.   Following are the goals we discussed today:   Goals Addressed             This Visit's Progress    Obtain Supportive Resources-Management of Mental Health Symptoms   On track    Activities and task to complete in order to accomplish goals.   Keep all upcoming appointments discussed today Continue with compliance of taking medication prescribed by Doctor Implement healthy coping skills discussed to assist with management of symptoms Continue working with [facility] care team to assist with goals identified         Our next appointment is by telephone on 12/16 at 3:30 pm  Please call the care guide team at [phone] if you need to cancel or reschedule your appointment.   If you are experiencing a Mental Health or Behavioral Health Crisis or need someone to talk to, please call the Suicide and Crisis Lifeline: 988 call 911   Patient verbalizes understanding of instructions and care plan provided today and agrees to view in MyChart. Active MyChart status and patient understanding of how to access instructions and care plan via MyChart confirmed with patient.     [name], MSW, LCSW [facility] Care Management [employer]  [facility] [email or web address] Phone [phone] 5:54 PM

## 2022-12-12 NOTE — Telephone Encounter (Signed)
On 11/6 we received a fax from the patient's daughter, Pinkie Pool, with a request for Dr. [name] to complete a form that will help her get transportation assistance for the patient.  Dr. [name] has completed the form and I have faxed it to [facility] fax# [phone].  Also emailed a copy to the patient's daughter at [email or web address].

## 2022-12-12 NOTE — Patient Outreach (Signed)
  Care Coordination   Initial Visit Note   12/11/2022 Name: Lindsay [name] MRN: [id number] DOB: 1940-12-22  Lindsay [name] is a 82 y.o. year old female who sees Early, Sung Amabile, NP for primary care. I spoke with  Lindsay [name] by phone today.  What matters to the patients health and wellness today?  Symptom Management    Goals Addressed             This Visit's Progress    Obtain Supportive Resources-Management of Mental Health Symptoms   On track    Activities and task to complete in order to accomplish goals.   Keep all upcoming appointments discussed today Continue with compliance of taking medication prescribed by Doctor Implement healthy coping skills discussed to assist with management of symptoms Continue working with [facility] care team to assist with goals identified         SDOH assessments and interventions completed:  Yes  SDOH Interventions Today    Flowsheet Row Most Recent Value  SDOH Interventions   Food Insecurity Interventions Intervention Not Indicated  Housing Interventions Intervention Not Indicated        Care Coordination Interventions:  Yes, provided  Interventions Today    Flowsheet Row Most Recent Value  Chronic Disease   Chronic disease during today's visit Chronic Obstructive Pulmonary Disease (COPD), Congestive Heart Failure (CHF), Diabetes  General Interventions   General Interventions Discussed/Reviewed General Interventions Discussed, Doctor Visits, Durable Medical Equipment (DME)  Doctor Visits Discussed/Reviewed Doctor Visits Discussed  Durable Medical Equipment (DME) Oxygen  Mental Health Interventions   Mental Health Discussed/Reviewed Mental Health Discussed, Coping Strategies, Anxiety, Depression  [Pt identified stressors that negatively impacted anxiety/depression symptoms. States symptoms have since decreased and she is utilizing healthy coping skills. Support from adult daughter]  Nutrition Interventions   Nutrition Discussed/Reviewed  Nutrition Discussed, Decreasing sugar intake  Pharmacy Interventions   Pharmacy Dicussed/Reviewed Pharmacy Topics Discussed, Medication Adherence  [RNCM will f/up with pharmacy regarding insulin and education for diet modifications for pre-diabetes, per pt]  Safety Interventions   Safety Discussed/Reviewed Safety Discussed       Follow up plan: Follow up call scheduled for 2-6 weeks    Encounter Outcome:  Patient Visit Completed   Lindsay [name], MSW, LCSW [facility] Care Management [facility] Health  [facility] [name] Phone [phone] 5:53 PM

## 2022-12-13 DIAGNOSIS — M62512 Muscle wasting and atrophy, not elsewhere classified, left shoulder: Secondary | ICD-10-CM | POA: Diagnosis not present

## 2022-12-13 DIAGNOSIS — M62511 Muscle wasting and atrophy, not elsewhere classified, right shoulder: Secondary | ICD-10-CM | POA: Diagnosis not present

## 2022-12-13 DIAGNOSIS — R296 Repeated falls: Secondary | ICD-10-CM | POA: Diagnosis not present

## 2022-12-13 DIAGNOSIS — R278 Other lack of coordination: Secondary | ICD-10-CM | POA: Diagnosis not present

## 2022-12-14 DIAGNOSIS — M6281 Muscle weakness (generalized): Secondary | ICD-10-CM | POA: Diagnosis not present

## 2022-12-14 DIAGNOSIS — R2689 Other abnormalities of gait and mobility: Secondary | ICD-10-CM | POA: Diagnosis not present

## 2022-12-15 DIAGNOSIS — M6281 Muscle weakness (generalized): Secondary | ICD-10-CM | POA: Diagnosis not present

## 2022-12-15 DIAGNOSIS — R296 Repeated falls: Secondary | ICD-10-CM | POA: Diagnosis not present

## 2022-12-15 DIAGNOSIS — R2689 Other abnormalities of gait and mobility: Secondary | ICD-10-CM | POA: Diagnosis not present

## 2022-12-15 DIAGNOSIS — M62512 Muscle wasting and atrophy, not elsewhere classified, left shoulder: Secondary | ICD-10-CM | POA: Diagnosis not present

## 2022-12-15 DIAGNOSIS — M62511 Muscle wasting and atrophy, not elsewhere classified, right shoulder: Secondary | ICD-10-CM | POA: Diagnosis not present

## 2022-12-15 DIAGNOSIS — R278 Other lack of coordination: Secondary | ICD-10-CM | POA: Diagnosis not present

## 2022-12-18 DIAGNOSIS — R296 Repeated falls: Secondary | ICD-10-CM | POA: Diagnosis not present

## 2022-12-18 DIAGNOSIS — M62511 Muscle wasting and atrophy, not elsewhere classified, right shoulder: Secondary | ICD-10-CM | POA: Diagnosis not present

## 2022-12-18 DIAGNOSIS — R278 Other lack of coordination: Secondary | ICD-10-CM | POA: Diagnosis not present

## 2022-12-18 DIAGNOSIS — M62512 Muscle wasting and atrophy, not elsewhere classified, left shoulder: Secondary | ICD-10-CM | POA: Diagnosis not present

## 2022-12-19 DIAGNOSIS — R2689 Other abnormalities of gait and mobility: Secondary | ICD-10-CM | POA: Diagnosis not present

## 2022-12-19 DIAGNOSIS — M6281 Muscle weakness (generalized): Secondary | ICD-10-CM | POA: Diagnosis not present

## 2022-12-20 DIAGNOSIS — R278 Other lack of coordination: Secondary | ICD-10-CM | POA: Diagnosis not present

## 2022-12-20 DIAGNOSIS — M62511 Muscle wasting and atrophy, not elsewhere classified, right shoulder: Secondary | ICD-10-CM | POA: Diagnosis not present

## 2022-12-20 DIAGNOSIS — M62512 Muscle wasting and atrophy, not elsewhere classified, left shoulder: Secondary | ICD-10-CM | POA: Diagnosis not present

## 2022-12-20 DIAGNOSIS — M6281 Muscle weakness (generalized): Secondary | ICD-10-CM | POA: Diagnosis not present

## 2022-12-20 DIAGNOSIS — R296 Repeated falls: Secondary | ICD-10-CM | POA: Diagnosis not present

## 2022-12-20 DIAGNOSIS — R2689 Other abnormalities of gait and mobility: Secondary | ICD-10-CM | POA: Diagnosis not present

## 2022-12-26 DIAGNOSIS — R2689 Other abnormalities of gait and mobility: Secondary | ICD-10-CM | POA: Diagnosis not present

## 2022-12-26 DIAGNOSIS — M6281 Muscle weakness (generalized): Secondary | ICD-10-CM | POA: Diagnosis not present

## 2022-12-27 DIAGNOSIS — R296 Repeated falls: Secondary | ICD-10-CM | POA: Diagnosis not present

## 2022-12-27 DIAGNOSIS — R278 Other lack of coordination: Secondary | ICD-10-CM | POA: Diagnosis not present

## 2022-12-27 DIAGNOSIS — M62511 Muscle wasting and atrophy, not elsewhere classified, right shoulder: Secondary | ICD-10-CM | POA: Diagnosis not present

## 2022-12-27 DIAGNOSIS — M62512 Muscle wasting and atrophy, not elsewhere classified, left shoulder: Secondary | ICD-10-CM | POA: Diagnosis not present

## 2022-12-28 ENCOUNTER — Telehealth: Payer: Self-pay | Admitting: Nurse Practitioner

## 2022-12-28 DIAGNOSIS — R2689 Other abnormalities of gait and mobility: Secondary | ICD-10-CM | POA: Diagnosis not present

## 2022-12-28 DIAGNOSIS — M6281 Muscle weakness (generalized): Secondary | ICD-10-CM | POA: Diagnosis not present

## 2022-12-28 NOTE — Telephone Encounter (Signed)
Please call   Pt called to check on compression stockings that she said we were going to order for her

## 2023-01-04 ENCOUNTER — Inpatient Hospital Stay: Payer: Medicare HMO | Admitting: Pulmonary Disease

## 2023-01-05 ENCOUNTER — Telehealth: Payer: Medicare HMO | Admitting: Nurse Practitioner

## 2023-01-05 ENCOUNTER — Telehealth: Payer: Self-pay | Admitting: Pulmonary Disease

## 2023-01-05 ENCOUNTER — Ambulatory Visit: Payer: Self-pay

## 2023-01-05 DIAGNOSIS — M62511 Muscle wasting and atrophy, not elsewhere classified, right shoulder: Secondary | ICD-10-CM | POA: Diagnosis not present

## 2023-01-05 DIAGNOSIS — J42 Unspecified chronic bronchitis: Secondary | ICD-10-CM

## 2023-01-05 DIAGNOSIS — J9611 Chronic respiratory failure with hypoxia: Secondary | ICD-10-CM

## 2023-01-05 DIAGNOSIS — R296 Repeated falls: Secondary | ICD-10-CM | POA: Diagnosis not present

## 2023-01-05 DIAGNOSIS — I5032 Chronic diastolic (congestive) heart failure: Secondary | ICD-10-CM

## 2023-01-05 DIAGNOSIS — R278 Other lack of coordination: Secondary | ICD-10-CM | POA: Diagnosis not present

## 2023-01-05 DIAGNOSIS — M62512 Muscle wasting and atrophy, not elsewhere classified, left shoulder: Secondary | ICD-10-CM | POA: Diagnosis not present

## 2023-01-05 NOTE — Telephone Encounter (Signed)
Called the pt and there was no answer- LMTCB    

## 2023-01-05 NOTE — Telephone Encounter (Signed)
Beth please advise on if its for pt to take prednisone during the weekend

## 2023-01-05 NOTE — Telephone Encounter (Signed)
Called patient, no answer. Left message. Advised if she had prednisone on hand she could go ahead and take it, otherwise I can not send in a prescription without speaking with her and our office is currently closed. She should continue her inhalers as directed and keep apt with [place] on December 16th.

## 2023-01-05 NOTE — Patient Instructions (Signed)
Visit Information  Thank you for taking time to visit with me today. Please don't hesitate to contact me if I can be of assistance to you.   Following are the goals we discussed today:   Goals Addressed             This Visit's Progress    RN Care Coordination Activities - further follow up needed   On track    Care Coordination Interventions: Evaluation of current treatment plan related to CHF and COPD and patient's adherence to plan as established by provider Determined patient was unable to keep her post hospital Pulmonology follow up visit due to her transportation did not show up, this was scheduled through her [facility] health plan  Discussed patient is experiencing slightly worsening congestion this morning, she has not used her nebulizer  Reviewed medications with patient and discussed importance of medication adherence  Instructed patient to use her Albuterol nebulizer now and ongoing every 6 hours as needed and patient verbalizes understanding  Instructed patient to use her flutter valve 10 times per hour to help expand her lungs and help moved trapped air Discussed patient is feeling down and has lack of motivation to participate in social activities Active listening / Reflection utilized  Emotional Support Provided  Sent in basket message to [name] LCSW with patient status update related to reported depression  Discussed patient has an upcoming scheduled telephone visit with [name] LCSW scheduled for Monday, 01/08/23 @3 :30 PM Reviewed and discussed with patient her next scheduled Pulmonology follow up in person is scheduled for 01/08/23 @09 :30 AM, her transportation has been arranged by her health plan  Discussed plans with patient for ongoing care coordination follow up and provided patient with direct contact information for nurse care coordinator Patient will keep her post discharge follow up appointments with [place] [facility] and [facility]  at [facility]  Patient will continue to wear her Oxygen as directed  Patient will continue to follow her Heart Failure Action Plan as directed Patient will use her inhalers as instructed to help manage her COPD Patient will use her flutter valve as directed  Patient will continue to ambulate with her walker and use fall precautions Patient will contact her health care provider to report new symptoms or concerns  Patient will continue to work with LCSW for counseling and support  Patient will continue to work with nurse care coordinator for chronic disease management and care coordinator needs         Our next appointment is by telephone on 01/11/23 at 09:30 AM  Please call the care guide team at [phone] if you need to cancel or reschedule your appointment.   If you are experiencing a Mental Health or Behavioral Health Crisis or need someone to talk to, please call 1-800-273-TALK (toll free, 24 hour hotline)  Patient verbalizes understanding of instructions and care plan provided today and agrees to view in MyChart. Active MyChart status and patient understanding of how to access instructions and care plan via MyChart confirmed with patient.     [name] RN BSN CCM [place]  [facility], [facility] Health Nurse Care Coordinator  Direct Dial: [phone] Website: [email or web address]

## 2023-01-05 NOTE — Progress Notes (Unsigned)
Patient ID: Lindsay [name], female     DOB: 1940/08/15, 82 y.o.      MRN: [id number]  No chief complaint on file.   Virtual Visit via Video Note  I connected with Lindsay [name] on 01/08/23 at  3:30 PM EST by a video enabled telemedicine application and verified that I am speaking with the correct person using two identifiers.  Location: Patient: Home Provider: Office   I discussed the limitations of evaluation and management by telemedicine and the availability of in person appointments. The patient expressed understanding and agreed to proceed.  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 She is a patient of Dr. Lanora Manis and last seen in office 09/29/2022. Past medical history significant for PAF on Eliquis, CHF, HTN, GERD, obesity, MDD, GAD, HLD.    TEST/EVENTS:  02/05/2021 CT chest w/o contrast: enlarged heart size. No LAD. Lungs are clear. Mild persistent bibasilar pleural thickening. RLL atelectasis. Chronic compression fx of thoracic spine with kyphosis.  05/21/2022 CTA chest: no PE. Cardiomegaly. Stable aneurysmal aortic arch (4 cm). No acute process. Atherosclerosis.  11/11/2022 CTA chest: no PE. No LAD. Patulous esophagus but no wall thickening. Moderate right and small left effusion. Associated compressive atelectasis. Ground-glass in both lungs, likely edema. Atherosclerosis. Cardiomegaly. Chronic compression deformities.  11/15/2022 CXR persistent perihilar and peripheral interstitial edema.   09/29/2022: OV with Dr. [name]. Treated with prednisone and z pack for recent call with increased dyspnea, mucous production and LE edema. Also advised to increased torsemide for 3 days. Complaining of scratchy voice. No issues with her voice since being on Trelegy. Wants flu shot.   01/05/2023: Today - follow up Patient presents today for follow up. 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 She was treated with diuretics, abx and steroids. She was discharged on torsemide and doxycycline. She was weaned to 2 lpm supplemental O2 on discharge, which is her baseline.  She has been feeling better since she got home. She did start having some congestion a few days ago. She has been coughing here and there with clear phlegm. Doesn't necessarily feel like she's coughing more than usual. Breathing feels like it's at her baseline. She notices an occasional wheeze. She denies any fevers, chills, hemoptysis. She hasn't had any increased weight or worsening lower extremity edema. She started her neb treatments today. She has been taking mucinex twice a day. Still using her Trelegy. No missed doses of Eliquis. She does live at a facility and some people have tested positive for COVID. No sinus symptoms. Oxygen levels have been stable on 2 lpm.   Allergies  Allergen Reactions   Acetaminophen-Codeine Other (See Comments)    Agitation   Naproxen-Esomeprazole Mg Diarrhea   Codeine Other (See Comments)    Hallucinations    Immunization History  Administered Date(s) Administered   DT (Pediatric) 07/23/1992, 09/09/2007   DTaP 08/06/1992   Fluad Trivalent(High Dose 65+) 10/21/2015, 10/26/2016, 10/30/2017, 09/29/2022   Influenza Nasal 11/25/2014   Influenza Split 11/21/1996, 12/06/2004, 12/12/2004, 11/23/2005, 11/28/2005, 10/24/2006, 11/29/2006, 10/24/2007, 01/09/2008, 10/23/2008, 11/09/2008, 10/14/2009, 11/03/2009, 10/23/2012   Influenza, High Dose Seasonal PF 11/03/2012, 10/15/2013, 10/19/2013, 10/28/2018, 10/04/2019   Influenza, Seasonal, Injecte, Preservative Fre 10/25/2010   Influenza,inj,Quad PF,6+ Mos 11/10/2011   Influenza,trivalent, recombinat, inj, PF 10/21/2015, 10/26/2016, 10/30/2017   Influenza-Unspecified 11/16/1999, 11/13/2000, 11/22/2001, 12/10/2003, 10/26/2016  Moderna SARS-COV2 Booster Vaccination 02/22/2020   Moderna Sars-Covid-2 Vaccination 02/14/2019, 03/14/2019   Pneumococcal Conjugate-13 07/10/2013, 02/05/2020   Pneumococcal Polysaccharide-23 04/09/2006, 02/18/2021   Td 07/23/1992, 09/09/2007, 10/21/2020   Td (Adult),5 Lf Tetanus Toxid, Preservative Free 08/06/1992   Td (Adult),unspecified 09/09/2007   Zoster Recombinant(Shingrix) 04/12/2020, 01/04/2021   Zoster, Live 02/23/2012   Past Medical History:  Diagnosis Date   Acute diastolic CHF (congestive heart failure) (HCC) 08/03/2020   Acute non-recurrent pansinusitis 10/21/2020   Acute on chronic respiratory failure with hypoxia (HCC) 08/02/2020   Asthma    Asthma exacerbation 08/02/2020   Atrial fibrillation (HCC)    Dysphagia 08/02/2020   Dyspnea 05/09/2021   Elevated troponin 09/14/2020   High cholesterol    HTN (hypertension)    Hyperkalemia 09/14/2020   Hyponatremia 08/02/2020   Low grade squamous intraepithelial lesion on cytologic smear of cervix (LGSIL) 12/02/2019   Moderate persistent asthma without complication 12/02/2019   Non-healing wound of right lower extremity 08/01/2022   Respiratory failure with hypoxia (HCC)    Sore throat 11/16/2020    Tobacco History: Social History   Tobacco Use  Smoking Status Former   Types: Cigarettes  Smokeless Tobacco Never  Tobacco Comments   Quit remotely 1970s   Counseling given: Not Answered Tobacco comments: Quit remotely 1970s   Outpatient Medications Prior to Visit  Medication Sig Dispense Refill   albuterol (PROVENTIL) (5 MG/ML) 0.5% nebulizer solution Take 0.5 mLs by nebulization every 6 (six) hours as needed for wheezing or shortness of breath.     alendronate (FOSAMAX) 70 MG tablet Take 1 tablet (70 mg total) by mouth once a week. Take with a full glass of water on an empty stomach. (Patient taking differently: Take 70 mg by mouth every Saturday. Take with a full glass of water on an empty stomach.) 12 tablet 1    apixaban (ELIQUIS) 5 MG TABS tablet Take 1 tablet (5 mg total) by mouth 2 (two) times daily. 60 tablet 5   atorvastatin (LIPITOR) 40 MG tablet Take 1 tablet (40 mg total) by mouth daily. 90 tablet 3   Blood Glucose Monitoring Suppl DEVI 1 each by Does not apply route in the morning, at noon, and at bedtime. May substitute to any manufacturer covered by patient's insurance. 1 each 0   Calcium-Magnesium-Vitamin D (CALCIUM 1200+D3 PO) Take 1 tablet by mouth in the morning.     cetirizine (ZYRTEC) 10 MG tablet TAKE 1 TABLET BY MOUTH EVERY DAY (Patient taking differently: Take 10 mg by mouth at bedtime.) 30 tablet 11   Cyanocobalamin (B-12) 1000 MCG SUBL Place 1 tablet under the tongue daily at 6 (six) AM. 90 tablet 3   Elastic Bandages & Supports (MEDICAL COMPRESSION STOCKINGS) MISC Two pair knee high 30-27mmHg compression stockings. Sized XL. As covered by insurance for ICD-10: I50.33, R60.0. 2 each 1   fluticasone (FLONASE) 50 MCG/ACT nasal spray Place 1 spray into both nostrils 2 (two) times daily as needed for allergies or rhinitis.     Fluticasone-Umeclidin-Vilant (TRELEGY ELLIPTA) 100-62.5-25 MCG/ACT AEPB Inhale 1 puff into the lungs daily. 180 each 3   Glucose Blood (BLOOD GLUCOSE TEST STRIPS) STRP For monitoring blood sugar levels up to 4 times a day. May substitute to any manufacturer covered by patient's insurance. 200 strip 99   guaiFENesin (MUCINEX) 600 MG 12 hr tablet TAKE 1 TABLET BY MOUTH 2 TIMES DAILY (Patient taking differently: Take 600 mg by mouth 2 (two) times daily as needed for to loosen phlegm or  cough.) 30 tablet 0   ipratropium-albuterol (DUONEB) 0.5-2.5 (3) MG/3ML SOLN Inhale 3 mLs into the lungs every 6 (six) hours as needed (wheezing). 1080 each 3   Lancet Device MISC For monitoring blood sugar up to 4 times a day. May substitute to any manufacturer covered by patient's insurance. 200 each 99   Lancets Misc. MISC For monitoring blood sugar up to 4 times a day. May substitute  to any manufacturer covered by patient's insurance. 200 each 99   LORazepam (ATIVAN) 1 MG tablet TAKE 1 TABLET BY MOUTH 2 TIMES DAILY AS NEEDED FOR ANXIETY 60 tablet 2   Menthol (COUGH DROPS) 5.8 MG LOZG Use as directed 1 lozenge (5.8 mg total) in the mouth or throat as needed (for dry, sore, or scratchy throat). 36 lozenge 11   metoprolol tartrate (LOPRESSOR) 25 MG tablet Take 25 mg by mouth in the morning and at bedtime.     mirtazapine (REMERON) 45 MG tablet TAKE 1 TABLET BY MOUTH AT BEDTIME 90 tablet 0   montelukast (SINGULAIR) 10 MG tablet Take 10 mg by mouth at bedtime.     Multiple Vitamin (MULTIVITAMIN WITH MINERALS) TABS tablet Take 1 tablet by mouth daily with breakfast.     OXYGEN Inhale 2 L/min into the lungs continuous.     pantoprazole (PROTONIX) 20 MG tablet Take 1 tablet (20 mg total) by mouth daily. 90 tablet 1   potassium chloride (KLOR-CON M) 20 MEQ tablet Take 1 tablet (20 mEq total) by mouth daily. 90 tablet 3   sertraline (ZOLOFT) 100 MG tablet Take 1 tablet (100 mg total) by mouth at bedtime. 90 tablet 3   Spacer/Aero-Holding Chambers (AEROCHAMBER MV) inhaler Use as instructed 1 each 0   torsemide (DEMADEX) 20 MG tablet TAKE 2 TABLETS DAILY EVERY MORNING. TAKE AN ADDITIONAL 2 TABLETS AS NEEDED FOR WEIGHT GAIN OF 2 POUNDS OVERNIGHT OR 5 POUNDS IN ONE WEEK.     No facility-administered medications prior to visit.     Review of Systems:   Constitutional: No weight loss or gain, night sweats, fevers, chills, fatigue, or lassitude. HEENT: No headaches, difficulty swallowing, tooth/dental problems, or sore throat. No sneezing, itching, ear ache, nasal congestion, or post nasal drip CV:  +swelling in lower extremities (baseline). No chest pain, orthopnea, PND, anasarca, dizziness, palpitations, syncope Resp: +shortness of breath with exertion (baseline); chest congestion; productive cough; occasional wheeze. No excess mucus or change in color of mucus. No hemoptysis. No chest  wall deformity GI:  No heartburn, indigestion GU: No dysuria, change in color of urine, urgency or frequency.  Skin: No rash, lesions, ulcerations MSK:  No joint pain or swelling.  Neuro: No dizziness or lightheadedness.  Psych: No depression or anxiety. Mood stable.   Observations/Objective: Unable to visualize due to technical difficulties. A&Ox3. Unlabored breathing. Speech is clear and coherent with logical content. Speaks in complete sentences without difficulties.    Assessment and Plan: Chronic obstructive pulmonary disease (HCC) Possible mild exacerbation related to viral illness. Advised to COVID test at home and notify if positive. She will start prednisone taper. Optimize mucociliary and bronchodilator therapies. Strict return/ED precautions. Action plan in place.   Patient Instructions  Continue Albuterol inhaler 2 puffs or 3 mL neb every 6 hours as needed for shortness of breath or wheezing. Notify if symptoms persist despite rescue inhaler/neb use. Use neb at least twice daily until symptoms improve. Use nebs 2-3 times a day until symptoms improve  Continue Trelegy 1 puff daily. Brush tongue  and rinse mouth afterwards Continue Eliquis 1 tab Twice daily as directed by cardiology. Monitor for any excessive bruising or bleeding Continue cetirizine 1 tab daily  Continue supplemental oxygen 2 lpm for goal >88-90%  Continue protonix 1 tab daily  Continue torsemide 2 tabs every morning. Monitor weights - notify your cardiologist of 2-3 lb weight gain overnight or 5 lb in a week    Prednisone taper. 4 tabs for 2 days, then 3 tabs for 2 days, 2 tabs for 2 days, then 1 tab for 2 days, then stop. Take in AM with food. You have this already at home but call if you need a new prescription  Continue guaifenesin 1 tab Twice daily for congestion   Chest x ray when you come back   COVID test and call me back if positive    Follow up in 4 weeks with Dr. [name] or [name]. If  symptoms do not improve or worsen, please contact office for sooner follow up or seek emergency care.    Chronic respiratory failure with hypoxia (HCC) Stable without increased O2 requirement. Goal >88-90%  Chronic CHF (congestive heart failure) (HCC) Per her history, sounds to be euvolemic. Advised to continue monitoring weights/swelling. Will obtain follow up imaging at next OV to ensure radiographic improvement. Follow up with cardiology as scheduled.      I discussed the assessment and treatment plan with the patient. The patient was provided an opportunity to ask questions and all were answered. The patient agreed with the plan and demonstrated an understanding of the instructions.   The patient was advised to call back or seek an in-person evaluation if the symptoms worsen or if the condition fails to improve as anticipated.  I provided 32 minutes of non-face-to-face time during this encounter.   Noemi Chapel, NP

## 2023-01-05 NOTE — Patient Outreach (Signed)
Care Coordination   Follow Up Visit Note   01/05/2023 Name: Lindsay [name] MRN: [id number] DOB: 11/10/1940  Lindsay [name] is a 82 y.o. year old female who sees Early, Sung Amabile, NP for primary care. I spoke with  Lindsay [name] by phone today.  What matters to the patients health and wellness today?  Patient would like to improve her breathing.     Goals Addressed             This Visit's Progress    RN Care Coordination Activities - further follow up needed   On track    Care Coordination Interventions: Evaluation of current treatment plan related to CHF and COPD and patient's adherence to plan as established by provider Determined patient was unable to keep her post hospital Pulmonology follow up visit due to her transportation did not show up, this was scheduled through her [facility] health plan  Discussed patient is experiencing slightly worsening congestion this morning, she has not used her nebulizer  Reviewed medications with patient and discussed importance of medication adherence  Instructed patient to use her Albuterol nebulizer now and ongoing every 6 hours as needed and patient verbalizes understanding  Instructed patient to use her flutter valve 10 times per hour to help expand her lungs and help moved trapped air Discussed patient is feeling down and has lack of motivation to participate in social activities Active listening / Reflection utilized  Emotional Support Provided  Sent in basket message to [name] LCSW with patient status update related to reported depression  Discussed patient has an upcoming scheduled telephone visit with [name] LCSW scheduled for Monday, 01/08/23 @3 :30 PM Reviewed and discussed with patient her next scheduled Pulmonology follow up in person is scheduled for 01/08/23 @09 :30 AM, her transportation has been arranged by her health plan  Discussed plans with patient for ongoing care coordination follow up and provided patient with direct contact  information for nurse care coordinator Patient will keep her post discharge follow up appointments with [facility] and [facility]  Patient will continue to wear her Oxygen as directed  Patient will continue to follow her Heart Failure Action Plan as directed Patient will use her inhalers as instructed to help manage her COPD Patient will use her flutter valve as directed  Patient will continue to ambulate with her walker and use fall precautions Patient will contact her health care provider to report new symptoms or concerns  Patient will continue to work with LCSW for counseling and support  Patient will continue to work with nurse care coordinator for chronic disease management and care coordinator needs     Interventions Today    Flowsheet Row Most Recent Value  Chronic Disease   Chronic disease during today's visit Chronic Obstructive Pulmonary Disease (COPD), Congestive Heart Failure (CHF)  General Interventions   General Interventions Discussed/Reviewed General Interventions Discussed, General Interventions Reviewed, Doctor Visits, Durable Medical Equipment (DME), Communication with  Doctor Visits Discussed/Reviewed Doctor Visits Discussed, Doctor Visits Reviewed, Specialist  Durable Medical Equipment (DME) Other  [Nebulizer]  Communication with Social Work  [name] LCSW]  Education Interventions   Education Provided Provided Education  Provided Verbal Education On When to see the doctor, Medication, Mental Health/Coping with Illness  Mental Health Interventions   Mental Health Discussed/Reviewed Mental Health Discussed, Mental Health Reviewed, Coping Strategies, Depression, Refer to Social Work for counseling, Refer to Social Work for resources  Refer to Social Work for counseling regarding  Depression  Refer to Social Work for resources regarding Mental Health  Pharmacy Interventions   Pharmacy Dicussed/Reviewed Pharmacy  Topics Discussed, Pharmacy Topics Reviewed, Medications and their functions          SDOH assessments and interventions completed:  No     Care Coordination Interventions:  Yes, provided   Follow up plan: Follow up call scheduled for 01/11/23 @09 :30 AM    Encounter Outcome:  Patient Visit Completed

## 2023-01-08 ENCOUNTER — Encounter: Payer: Self-pay | Admitting: Nurse Practitioner

## 2023-01-08 ENCOUNTER — Inpatient Hospital Stay: Payer: Medicare HMO | Admitting: Nurse Practitioner

## 2023-01-08 ENCOUNTER — Ambulatory Visit: Payer: Self-pay | Admitting: Licensed Clinical Social Worker

## 2023-01-08 ENCOUNTER — Telehealth: Payer: Self-pay | Admitting: Nurse Practitioner

## 2023-01-08 NOTE — Assessment & Plan Note (Signed)
Stable without increased O2 requirement. Goal >88-90% 

## 2023-01-08 NOTE — Telephone Encounter (Signed)
Created in error

## 2023-01-08 NOTE — Patient Instructions (Addendum)
Continue Albuterol inhaler 2 puffs or 3 mL neb every 6 hours as needed for shortness of breath or wheezing. Notify if symptoms persist despite rescue inhaler/neb use. Use neb at least twice daily until symptoms improve. Use nebs 2-3 times a day until symptoms improve  Continue Trelegy 1 puff daily. Brush tongue and rinse mouth afterwards Continue Eliquis 1 tab Twice daily as directed by cardiology. Monitor for any excessive bruising or bleeding Continue cetirizine 1 tab daily  Continue supplemental oxygen 2 lpm for goal >88-90%  Continue protonix 1 tab daily  Continue torsemide 2 tabs every morning. Monitor weights - notify your cardiologist of 2-3 lb weight gain overnight or 5 lb in a week    Prednisone taper. 4 tabs for 2 days, then 3 tabs for 2 days, 2 tabs for 2 days, then 1 tab for 2 days, then stop. Take in AM with food. You have this already at home but call if you need a new prescription  Continue guaifenesin 1 tab Twice daily for congestion   Chest x ray when you come back   COVID test and call me back if positive    Follow up in 4 weeks with Dr. [name] or [name]. If symptoms do not improve or worsen, please contact office for sooner follow up or seek emergency care.

## 2023-01-08 NOTE — Assessment & Plan Note (Addendum)
Per her history, sounds to be euvolemic. Advised to continue monitoring weights/swelling. Will obtain follow up imaging at next OV to ensure radiographic improvement. Follow up with cardiology as scheduled.

## 2023-01-08 NOTE — Assessment & Plan Note (Signed)
Possible mild exacerbation related to viral illness. Advised to COVID test at home and notify if positive. She will start prednisone taper. Optimize mucociliary and bronchodilator therapies. Strict return/ED precautions. Action plan in place.   Patient Instructions  Continue Albuterol inhaler 2 puffs or 3 mL neb every 6 hours as needed for shortness of breath or wheezing. Notify if symptoms persist despite rescue inhaler/neb use. Use neb at least twice daily until symptoms improve. Use nebs 2-3 times a day until symptoms improve  Continue Trelegy 1 puff daily. Brush tongue and rinse mouth afterwards Continue Eliquis 1 tab Twice daily as directed by cardiology. Monitor for any excessive bruising or bleeding Continue cetirizine 1 tab daily  Continue supplemental oxygen 2 lpm for goal >88-90%  Continue protonix 1 tab daily  Continue torsemide 2 tabs every morning. Monitor weights - notify your cardiologist of 2-3 lb weight gain overnight or 5 lb in a week    Prednisone taper. 4 tabs for 2 days, then 3 tabs for 2 days, 2 tabs for 2 days, then 1 tab for 2 days, then stop. Take in AM with food. You have this already at home but call if you need a new prescription  Continue guaifenesin 1 tab Twice daily for congestion   Chest x ray when you come back   COVID test and call me back if positive    Follow up in 4 weeks with Dr. [name] or [name]. If symptoms do not improve or worsen, please contact office for sooner follow up or seek emergency care.

## 2023-01-09 DIAGNOSIS — R2689 Other abnormalities of gait and mobility: Secondary | ICD-10-CM | POA: Diagnosis not present

## 2023-01-09 DIAGNOSIS — M6281 Muscle weakness (generalized): Secondary | ICD-10-CM | POA: Diagnosis not present

## 2023-01-10 ENCOUNTER — Telehealth: Payer: Self-pay

## 2023-01-10 ENCOUNTER — Other Ambulatory Visit (HOSPITAL_BASED_OUTPATIENT_CLINIC_OR_DEPARTMENT_OTHER): Payer: Self-pay | Admitting: Nurse Practitioner

## 2023-01-10 DIAGNOSIS — F3341 Major depressive disorder, recurrent, in partial remission: Secondary | ICD-10-CM

## 2023-01-10 DIAGNOSIS — R278 Other lack of coordination: Secondary | ICD-10-CM | POA: Diagnosis not present

## 2023-01-10 DIAGNOSIS — M6281 Muscle weakness (generalized): Secondary | ICD-10-CM | POA: Diagnosis not present

## 2023-01-10 DIAGNOSIS — R296 Repeated falls: Secondary | ICD-10-CM | POA: Diagnosis not present

## 2023-01-10 DIAGNOSIS — F411 Generalized anxiety disorder: Secondary | ICD-10-CM

## 2023-01-10 DIAGNOSIS — M62511 Muscle wasting and atrophy, not elsewhere classified, right shoulder: Secondary | ICD-10-CM | POA: Diagnosis not present

## 2023-01-10 DIAGNOSIS — M62512 Muscle wasting and atrophy, not elsewhere classified, left shoulder: Secondary | ICD-10-CM | POA: Diagnosis not present

## 2023-01-10 DIAGNOSIS — R2689 Other abnormalities of gait and mobility: Secondary | ICD-10-CM | POA: Diagnosis not present

## 2023-01-10 NOTE — Telephone Encounter (Signed)
Spoke with pt she ask to mail PAP for Trelegy and Elequis (GSK and BMS) pt mention she has to meet her deductible of $600 before we can apply again.Have not fax PAP to Dr office at this time.

## 2023-01-11 ENCOUNTER — Ambulatory Visit: Payer: Self-pay

## 2023-01-11 NOTE — Telephone Encounter (Signed)
Last apt 11/28/22

## 2023-01-11 NOTE — Patient Outreach (Signed)
Care Coordination   Follow Up Visit Note   01/11/2023 Name: Lindsay [name] MRN: [id number] DOB: [birth year]  Lindsay [name] is a 82 y.o. year old female who sees Early, Sung Amabile, NP for primary care. I spoke with  Lindsay [name] by phone today.  What matters to the patients health and wellness today?  Patient would like to continue to manage her chronic health conditions and stay out of the hospital.     Goals Addressed             This Visit's Progress    RN Care Coordination Activities - further follow up needed   On track    Care Coordination Interventions: Evaluation of current treatment plan related to CHF and COPD and patient's adherence to plan as established by provider Reviewed and discussed with patient she was able to complete a video visit with [name] for evaluation of post hospital and new onset of cough and congestion Review of patient status, including review of consultant's reports, relevant laboratory and other test results, and medications completed Assessment and Plan: Chronic obstructive pulmonary disease (HCC) Possible mild exacerbation related to viral illness. Advised to COVID test at home and notify if positive. She will start prednisone taper. Optimize mucociliary and bronchodilator therapies. Strict return/ED precautions. Action plan in place.  Patient Instructions  Continue Albuterol inhaler 2 puffs or 3 mL neb every 6 hours as needed for shortness of breath or wheezing. Notify if symptoms persist despite rescue inhaler/neb use. Use neb at least twice daily until symptoms improve. Use nebs 2-3 times a day until symptoms improve  Continue Trelegy 1 puff daily. Brush tongue and rinse mouth afterwards Continue Eliquis 1 tab Twice daily as directed by cardiology. Monitor for any excessive bruising or bleeding Continue cetirizine 1 tab daily  Continue supplemental oxygen 2 lpm for goal >88-90%  Continue protonix 1 tab daily  Continue torsemide 2 tabs every  morning. Monitor weights - notify your cardiologist of 2-3 lb weight gain overnight or 5 lb in a week  Prednisone taper. 4 tabs for 2 days, then 3 tabs for 2 days, 2 tabs for 2 days, then 1 tab for 2 days, then stop. Take in AM with food. You have this already at home but call if you need a new prescription  Continue guaifenesin 1 tab Twice daily for congestion Chest x ray when you come back  COVID test and call me back if positive (patient reports COVID test was negative) Follow up in 4 weeks with Dr. [name] or [name]. If symptoms do not improve or worsen, please contact office for sooner follow up or seek emergency care.  Chronic respiratory failure with hypoxia (HCC) Stable without increased O2 requirement. Goal >88-90% Chronic CHF (congestive heart failure) (HCC) Per her history, sounds to be euvolemic. Advised to continue monitoring weights/swelling. Will obtain follow up imaging at next OV to ensure radiographic improvement. Follow up with cardiology as scheduled.   Determined patient is able to verbalize understanding of her prescribed treatment plan  Instructed patient to contact her doctor to report new or worsening symptoms promptly and patient is agreeable  Patient will continue to wear her Oxygen as directed  Patient will continue to follow her COPD and Heart Failure Action Plan as directed Patient will use her inhalers as instructed to help manage her COPD Patient will use her flutter valve as directed  Patient will continue to ambulate with her walker and use fall precautions Patient will contact her health  care provider to report new symptoms or concerns  Patient will continue to work with LCSW for counseling and support  Patient will continue to work with nurse care coordinator for chronic disease management and care coordinator needs     Interventions Today    Flowsheet Row Most Recent Value  Chronic Disease   Chronic disease during today's visit Congestive Heart  Failure (CHF), Chronic Obstructive Pulmonary Disease (COPD)  General Interventions   General Interventions Discussed/Reviewed General Interventions Discussed, General Interventions Reviewed, Doctor Visits, Durable Medical Equipment (DME)  Doctor Visits Discussed/Reviewed Doctor Visits Discussed, Doctor Visits Reviewed, Specialist  Durable Medical Equipment (DME) Oxygen  Education Interventions   Provided Verbal Education On When to see the doctor, Medication  Pharmacy Interventions   Pharmacy Dicussed/Reviewed Pharmacy Topics Discussed, Pharmacy Topics Reviewed, Medications and their functions, Medication Adherence          SDOH assessments and interventions completed:  No     Care Coordination Interventions:  Yes, provided   Follow up plan: Follow up call scheduled for 02/01/23 @1 :15 PM    Encounter Outcome:  Patient Visit Completed

## 2023-01-11 NOTE — Patient Instructions (Signed)
Visit Information  Thank you for taking time to visit with me today. Please don't hesitate to contact me if I can be of assistance to you.   Following are the goals we discussed today:   Goals Addressed             This Visit's Progress    RN Care Coordination Activities - further follow up needed   On track    Care Coordination Interventions: Evaluation of current treatment plan related to CHF and COPD and patient's adherence to plan as established by provider Reviewed and discussed with patient she was able to complete a video visit with [facility] for evaluation of post hospital and new onset of cough and congestion Review of patient status, including review of consultant's reports, relevant laboratory and other test results, and medications completed Assessment and Plan: Chronic obstructive pulmonary disease (HCC) Possible mild exacerbation related to viral illness. Advised to COVID test at home and notify if positive. She will start prednisone taper. Optimize mucociliary and bronchodilator therapies. Strict return/ED precautions. Action plan in place.  Patient Instructions  Continue Albuterol inhaler 2 puffs or 3 mL neb every 6 hours as needed for shortness of breath or wheezing. Notify if symptoms persist despite rescue inhaler/neb use. Use neb at least twice daily until symptoms improve. Use nebs 2-3 times a day until symptoms improve  Continue Trelegy 1 puff daily. Brush tongue and rinse mouth afterwards Continue Eliquis 1 tab Twice daily as directed by cardiology. Monitor for any excessive bruising or bleeding Continue cetirizine 1 tab daily  Continue supplemental oxygen 2 lpm for goal >88-90%  Continue protonix 1 tab daily  Continue torsemide 2 tabs every morning. Monitor weights - notify your cardiologist of 2-3 lb weight gain overnight or 5 lb in a week  Prednisone taper. 4 tabs for 2 days, then 3 tabs for 2 days, 2 tabs for 2 days, then 1 tab for 2 days, then stop.  Take in AM with food. You have this already at home but call if you need a new prescription  Continue guaifenesin 1 tab Twice daily for congestion Chest x ray when you come back  COVID test and call me back if positive (patient reports COVID test was negative) Follow up in 4 weeks with Dr. [name] or [name]. If symptoms do not improve or worsen, please contact office for sooner follow up or seek emergency care.  Chronic respiratory failure with hypoxia (HCC) Stable without increased O2 requirement. Goal >88-90% Chronic CHF (congestive heart failure) (HCC) Per her history, sounds to be euvolemic. Advised to continue monitoring weights/swelling. Will obtain follow up imaging at next OV to ensure radiographic improvement. Follow up with cardiology as scheduled.   Determined patient is able to verbalize understanding of her prescribed treatment plan  Instructed patient to contact her doctor to report new or worsening symptoms promptly and patient is agreeable  Patient will continue to wear her Oxygen as directed  Patient will continue to follow her COPD and Heart Failure Action Plan as directed Patient will use her inhalers as instructed to help manage her COPD Patient will use her flutter valve as directed  Patient will continue to ambulate with her walker and use fall precautions Patient will contact her health care provider to report new symptoms or concerns  Patient will continue to work with LCSW for counseling and support  Patient will continue to work with nurse care coordinator for chronic disease management and care coordinator needs  Our next appointment is by telephone on 02/01/23 at 1:15 PM  Please call the care guide team at [phone] if you need to cancel or reschedule your appointment.   If you are experiencing a Mental Health or Behavioral Health Crisis or need someone to talk to, please call 1-800-273-TALK (toll free, 24 hour hotline)  Patient verbalizes  understanding of instructions and care plan provided today and agrees to view in MyChart. Active MyChart status and patient understanding of how to access instructions and care plan via MyChart confirmed with patient.     [name] RN BSN CCM [place]  [facility], [facility] Nurse Care Coordinator  Direct Dial: [phone] Website: [email or web address]

## 2023-01-12 NOTE — Patient Instructions (Signed)
Visit Information  Thank you for taking time to visit with me today. Please don't hesitate to contact me if I can be of assistance to you.   Following are the goals we discussed today:   Goals Addressed             This Visit's Progress    Obtain Supportive Resources-Management of Mental Health Symptoms   On track    Activities and task to complete in order to accomplish goals.   Keep all upcoming appointments discussed today Continue with compliance of taking medication prescribed by Doctor Implement healthy coping skills discussed to assist with management of symptoms Continue working with [facility] care team to assist with goals identified         Please call the care guide team at [phone] if you need to cancel or reschedule your appointment.   If you are experiencing a Mental Health or Behavioral Health Crisis or need someone to talk to, please call the Suicide and Crisis Lifeline: 988 call 911   Patient verbalizes understanding of instructions and care plan provided today and agrees to view in MyChart. Active MyChart status and patient understanding of how to access instructions and care plan via MyChart confirmed with patient.     [name], MSW, LCSW [facility] Care Management [place]  [facility] [email or web address] Phone [phone] 6:25 AM

## 2023-01-12 NOTE — Patient Outreach (Signed)
  Care Coordination   Follow Up Visit Note   01/08/2023 Name: Lindsay [name] MRN: [id number] DOB: [birth year]  Lindsay [name] is a 82 y.o. year old female who sees Lindsay [name], Lindsay Amabile, NP for primary care. I spoke with  Lindsay [name] by phone today.  What matters to the patients health and wellness today?  Transportation    Goals Addressed             This Visit's Progress    Obtain Supportive Resources-Management of Mental Health Symptoms   On track    Activities and task to complete in order to accomplish goals.   Keep all upcoming appointments discussed today Continue with compliance of taking medication prescribed by Doctor Implement healthy coping skills discussed to assist with management of symptoms Continue working with [facility] care team to assist with goals identified         SDOH assessments and interventions completed:  No     Care Coordination Interventions:  Yes, provided  Interventions Today    Flowsheet Row Most Recent Value  Chronic Disease   Chronic disease during today's visit Atrial Fibrillation (AFib), Congestive Heart Failure (CHF), Chronic Obstructive Pulmonary Disease (COPD), Other  [Depression and Anxiety]  General Interventions   General Interventions Discussed/Reviewed General Interventions Reviewed, [facility], Doctor Visits  [Transportation barriers, insurance benefit is not reliable, discussed Access [place]]  Doctor Visits Discussed/Reviewed Doctor Visits Reviewed  Mental Health Interventions   Mental Health Discussed/Reviewed Mental Health Reviewed, Coping Strategies       Follow up plan: Follow up call scheduled for 1-2 weeks    Encounter Outcome:  Patient Visit Completed   Lindsay [name], MSW, Lindsay [name] [facility] Care Management [facility]  [facility] [email or web address] Phone [phone] 6:25 AM

## 2023-01-13 DIAGNOSIS — H524 Presbyopia: Secondary | ICD-10-CM | POA: Diagnosis not present

## 2023-01-15 ENCOUNTER — Inpatient Hospital Stay: Payer: Medicare HMO | Admitting: Pulmonary Disease

## 2023-01-16 DIAGNOSIS — R2689 Other abnormalities of gait and mobility: Secondary | ICD-10-CM | POA: Diagnosis not present

## 2023-01-16 DIAGNOSIS — M6281 Muscle weakness (generalized): Secondary | ICD-10-CM | POA: Diagnosis not present

## 2023-01-22 NOTE — Telephone Encounter (Signed)
Pt fax back her PAP for Al[employer] and [employer] pt explain she has not met $600 deductible.at this time

## 2023-01-30 ENCOUNTER — Ambulatory Visit: Payer: Medicare HMO | Admitting: Nurse Practitioner

## 2023-02-07 ENCOUNTER — Telehealth: Payer: Self-pay | Admitting: Pulmonary Disease

## 2023-02-07 ENCOUNTER — Other Ambulatory Visit: Payer: Self-pay | Admitting: Pulmonary Disease

## 2023-02-07 DIAGNOSIS — J4521 Mild intermittent asthma with (acute) exacerbation: Secondary | ICD-10-CM

## 2023-02-07 DIAGNOSIS — J441 Chronic obstructive pulmonary disease with (acute) exacerbation: Secondary | ICD-10-CM

## 2023-02-07 DIAGNOSIS — J302 Other seasonal allergic rhinitis: Secondary | ICD-10-CM

## 2023-02-07 DIAGNOSIS — J9621 Acute and chronic respiratory failure with hypoxia: Secondary | ICD-10-CM

## 2023-02-07 NOTE — Telephone Encounter (Signed)
 Patient has a cough,congestion and low oxygen  level of 88. Patient would like something to be called in for this. [phone]  Pharmacy: [facility]

## 2023-02-08 ENCOUNTER — Ambulatory Visit: Payer: Self-pay

## 2023-02-08 DIAGNOSIS — I5033 Acute on chronic diastolic (congestive) heart failure: Secondary | ICD-10-CM

## 2023-02-08 DIAGNOSIS — J4489 Other specified chronic obstructive pulmonary disease: Secondary | ICD-10-CM

## 2023-02-08 MED ORDER — PREDNISONE 10 MG PO TABS: ORAL_TABLET | ORAL | 0 refills | Status: AC

## 2023-02-08 MED ORDER — AZITHROMYCIN 250 MG PO TABS: ORAL_TABLET | ORAL | 0 refills | Status: DC

## 2023-02-08 MED ORDER — IPRATROPIUM-ALBUTEROL 0.5-2.5 (3) MG/3ML IN SOLN: 3.0000 mL | Freq: Four times a day (QID) | RESPIRATORY_TRACT | 12 refills | Status: DC | PRN

## 2023-02-08 NOTE — Telephone Encounter (Signed)
Duonebs refilled.  Sent in Zpak and extended prednisone taper.

## 2023-02-08 NOTE — Telephone Encounter (Signed)
I called and spoke with the pt. Pt was informed of Dr Lanora Manis message. Pt verbalized understanding. NFN.

## 2023-02-08 NOTE — Patient Outreach (Signed)
  Care Coordination   Follow Up Visit Note   02/08/2023 Name: Lindsay [name] MRN: [id number] DOB: 12/16/1940  Lindsay [name] is a 83 y.o. year old female who sees Early, Sung Amabile, NP for primary care. I spoke with  Lindsay [name] by phone today.  What matters to the patients health and wellness today?  Patient would like to get assistance with the cost of her Eliquis.     Goals Addressed             This Visit's Progress    RN Care Coordination Activities - further follow up needed   On track    Care Coordination Interventions: Evaluation of current treatment plan related to CHF and COPD and patient's adherence to plan as established by provider Reviewed and discussed with patient, worsening symptoms of cough, congestion and Oxygen sats down to 88% x 1 occurrence Provided patient with basic written and verbal COPD education on self care/management/and exacerbation prevention Advised patient to track and manage COPD triggers Provided instruction about proper use of medications used for management of COPD including inhalers Reviewed and discussed upcoming scheduled follow up with [facility] V[facility] at [facility] scheduled with Lindsay Shields NP on 02/09/23 @3 :35 PM Discussed plans with patient for ongoing care coordination follow up and provided patient with direct contact information for nurse care coordinator [facility] referral to assist with cost of Eliquis       Interventions Today    Flowsheet Row Most Recent Value  Chronic Disease   Chronic disease during today's visit Congestive Heart Failure (CHF), Chronic Obstructive Pulmonary Disease (COPD)  General Interventions   General Interventions Discussed/Reviewed General Interventions Discussed, General Interventions Reviewed, Doctor Visits, Durable Medical Equipment (DME)  Doctor Visits Discussed/Reviewed Doctor Visits Reviewed, Doctor Visits Discussed, Specialist, PCP  Durable Medical Equipment (DME) Other, Oxygen  [nebulizer]   Education Interventions   Education Provided Provided Education  Provided Verbal Education On When to see the doctor, Medication  Pharmacy Interventions   Pharmacy Dicussed/Reviewed Pharmacy Topics Discussed, Pharmacy Topics Reviewed, Medications and their functions, Affording Medications, Referral to Pharmacist  Referral to Pharmacist Cannot afford medications  [Eliquis]          SDOH assessments and interventions completed:  No     Care Coordination Interventions:  Yes, provided   Follow up plan: Referral made to [facility] pharmacy to assist with cost of Eliquis   Follow up call scheduled for 02/22/23 @09 :30 AM    Encounter Outcome:  Patient Visit Completed

## 2023-02-08 NOTE — Telephone Encounter (Signed)
Pt states she has a lot of chest congestion and coughing. Pt states she is more sob than usual. Pt states she does not feel like doing anything, and would like to know if a rx could be sent in. Pt states she is also seeing her cardiologist tomorrow and would like to know if she just needs to wait to see what the cardiologist says. Pt states her chest does feel tight due to the congestion. Pt states the cough is dry and has been hard to cough up, but the times she has, the mucous is clear. Pt complains the sob has worsened in the last 2 days. Pt states she is on 2L O2 constantly. Pt states yesterday she checked her O2 sats, and the O2 was 88%. Pt states she was on oxygen when she checked this. Pt states her nebulizer medication also expired, and needs new nebulizer medication. Pt states she has been wheezing, and was using her expired neb solution. Please advise. Pt states her fingers were cold when she did her pulse ox, and I advised her that she needed to make sure her fingers were warm first, and that O2 reading may not have been correct.

## 2023-02-08 NOTE — Patient Instructions (Signed)
Visit Information  Thank you for taking time to visit with me today. Please don't hesitate to contact me if I can be of assistance to you.   Following are the goals we discussed today:   Goals Addressed             This Visit's Progress    RN Care Coordination Activities - further follow up needed   On track    Care Coordination Interventions: Evaluation of current treatment plan related to CHF and COPD and patient's adherence to plan as established by provider Reviewed and discussed with patient, worsening symptoms of cough, congestion and Oxygen sats down to 88% x 1 occurrence Provided patient with basic written and verbal COPD education on self care/management/and exacerbation prevention Advised patient to track and manage COPD triggers Provided instruction about proper use of medications used for management of COPD including inhalers Reviewed and discussed upcoming scheduled follow up with [facility] at [facility] scheduled with Gillian Shields NP on 02/09/23 @3 :35 PM Discussed plans with patient for ongoing care coordination follow up and provided patient with direct contact information for nurse care coordinator [facility] referral to assist with cost of Eliquis           Our next appointment is by telephone on 02/22/23 at 09:30 AM  Please call the care guide team at [phone] if you need to cancel or reschedule your appointment.   If you are experiencing a Mental Health or Behavioral Health Crisis or need someone to talk to, please call 1-800-273-TALK (toll free, 24 hour hotline)  Patient verbalizes understanding of instructions and care plan provided today and agrees to view in MyChart. Active MyChart status and patient understanding of how to access instructions and care plan via MyChart confirmed with patient.     [name] RN BSN CCM [place]  [facility], [facility] Nurse Care Coordinator  Direct Dial:  [phone] Website: [email or web address]

## 2023-02-09 ENCOUNTER — Ambulatory Visit (HOSPITAL_BASED_OUTPATIENT_CLINIC_OR_DEPARTMENT_OTHER): Payer: Medicare Other | Admitting: Family

## 2023-02-09 VITALS — BP 140/72 | HR 94 | Ht 68.0 in | Wt 239.5 lb

## 2023-02-09 DIAGNOSIS — D6859 Other primary thrombophilia: Secondary | ICD-10-CM

## 2023-02-09 DIAGNOSIS — I5033 Acute on chronic diastolic (congestive) heart failure: Secondary | ICD-10-CM

## 2023-02-09 DIAGNOSIS — I5032 Chronic diastolic (congestive) heart failure: Secondary | ICD-10-CM

## 2023-02-09 DIAGNOSIS — I7122 Aneurysm of the aortic arch, without rupture: Secondary | ICD-10-CM | POA: Diagnosis not present

## 2023-02-09 DIAGNOSIS — E785 Hyperlipidemia, unspecified: Secondary | ICD-10-CM

## 2023-02-09 DIAGNOSIS — I4819 Other persistent atrial fibrillation: Secondary | ICD-10-CM

## 2023-02-09 MED ORDER — TORSEMIDE 20 MG PO TABS: ORAL_TABLET | ORAL | 1 refills | Status: DC

## 2023-02-09 NOTE — Patient Instructions (Addendum)
Take all your Prednisone and antibiotics as prescribed   Start using your flutter valve three times a day.   Next time you purchase, just purchase Guafenesin (Muccinex) without DM as the 'DM' can make your blood pressure high.   Follow up in couple months with Ronn Melena NP

## 2023-02-09 NOTE — Progress Notes (Unsigned)
Cardiology Office Note:  .   Date:  02/11/2023  ID:  [name], DOB 07/13/1940, MRN [id number] PCP: Tollie Eth, NP  [facility] Providers Cardiologist:  Chrystie Nose, MD    History of Present Illness: .   Lindsay [name] is a 83 y.o. 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   Atrial fibrillation diagnosed many years ago and Eliquis initiated by primary care. Managed on Torsemide for chronic edema with improvement in fall 2021 after addition of Metolazone.    She was admitted 08/02/20 with acute on chronic hypoxic respiratory failure due to acute on chronic diastolic heart failure. Treated with IV diuresis. She was evaluated by speech therapy and also underwent endoscopy with achalasia recommended for soft diet.    Underwent successful cardioversion 09/13/2020.  Then lost to follow up til admitted 04/2021 with acute on chronic respiratory failure and asthma exacerbation.  Echocardiogram 05/10/2021 during admission with LVEF 60 to 65%, no RWMA, normal pulmonary pressure, left atrium mildly dilated, trivial MR.    Seen 01/02/2022 with rate controlled atrial fibrillation with minimal symptomology recommended for continued rate control.    At visit 06/2022 continued to follow with wound clinic for slow healing wound to left shin. Torsemide 40mg  daily continued with additional dose PRN.   Seen 11/06/22 requiring extra Torsemide once every few weeks. 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 11/11/22 stable mild dilation aortic arch 4cm. Also noted moderate R and small to moderate L pleural effusions and ground glass in both lungs as well as chronic deformities of the spine.  Admitted 10/18-10/24/24 with acute on chronic respiratory failure and cute on  chronic diastolic heart failure.    She presents today for follow up.  She resides in independent living facility though spending much of her time in her room. More short of breath over the last 4 days. Worse with exertion. Notes productive cough which is white. She has noticed some wheezing. She has been taking Muccinex-DM twice per day. We discussed  'DM' can make blood pressure little higher. She called pulmonology yesterday and and was started on prednisone, Zpack, and duoneb refilled. She is doing her nebulizer TID. She feels bit worse today - encouraged to complete course of antibiotics and prednisone. Her oxygen level yesterday at home was in the 80s but is appropriate in clinic. Taking extra Torsemide every few weeks with improvement in edema. Does note her left ear is "stopped up" impacting her hearing and encouraged to discuss with PCP.    ROS: Please see the history of present illness.    All other systems reviewed and are negative.   Studies Reviewed: .        Cardiac Studies & Procedures      ECHOCARDIOGRAM  ECHOCARDIOGRAM COMPLETE 11/11/2022  Narrative ECHOCARDIOGRAM REPORT    Patient Name:   Lindsay [name] Date of Exam: 11/11/2022 Medical Rec #:  [id number]  Height:       68.0 in Accession #:    [id number] Weight:       239.4 lb Date of Birth:  04-Jul-1940  BSA:          2.206 m Patient Age:    82 years   BP:           98/60 mmHg Patient Gender: F  HR:           85 bpm. Exam Location:  Inpatient  Procedure: 2D Echo, Cardiac Doppler, Color Doppler and Intracardiac Opacification Agent  Indications:    Congestive Heart Failure I50.9 Abnormal ECG R94.31 Atrial Fibrillation I48.91  History:        Patient has prior history of Echocardiogram examinations, most recent 05/10/2021. CHF, COPD, Signs/Symptoms:Dyspnea; Risk Factors:Hypertension.  Sonographer:    Darlys Gales Referring Phys: [id number] DAVID MANUEL ORTIZ  IMPRESSIONS   1. Left ventricular ejection  fraction, by estimation, is 55 to 60%. The left ventricle has normal function. The left ventricle has no regional wall motion abnormalities. There is mild concentric left ventricular hypertrophy. Left ventricular diastolic parameters are indeterminate. 2. RV McConnell's Sign oted. Right ventricular systolic function is mildly reduced. The right ventricular size is severely enlarged. 3. Left atrial size was moderately dilated. 4. Right atrial size was moderately dilated. 5. A small pericardial effusion is present. The pericardial effusion is surrounding the apex. 6. The mitral valve was not well visualized. No evidence of mitral valve regurgitation. No evidence of mitral stenosis. 7. Tricuspid valve regurgitation is mild to moderate. 8. The aortic valve was not well visualized. There is mild calcification of the aortic valve. There is mild thickening of the aortic valve. Aortic valve regurgitation is not visualized.  Comparison(s): Prior images reviewed side by side. RV not well visualized in prior study (basal function normal).  FINDINGS Left Ventricle: Left ventricular ejection fraction, by estimation, is 55 to 60%. The left ventricle has normal function. The left ventricle has no regional wall motion abnormalities. The left ventricular internal cavity size was normal in size. There is mild concentric left ventricular hypertrophy. Left ventricular diastolic parameters are indeterminate.  Right Ventricle: RV McConnell's Sign oted. The right ventricular size is severely enlarged. No increase in right ventricular wall thickness. Right ventricular systolic function is mildly reduced.  Left Atrium: Left atrial size was moderately dilated.  Right Atrium: Right atrial size was moderately dilated.  Pericardium: A small pericardial effusion is present. The pericardial effusion is surrounding the apex. Presence of epicardial fat layer.  Mitral Valve: The mitral valve was not well visualized. No  evidence of mitral valve regurgitation. No evidence of mitral valve stenosis.  Tricuspid Valve: The tricuspid valve is normal in structure. Tricuspid valve regurgitation is mild to moderate.  Aortic Valve: The aortic valve was not well visualized. There is mild calcification of the aortic valve. There is mild thickening of the aortic valve. There is mild aortic valve annular calcification. Aortic valve regurgitation is not visualized. Aortic valve mean gradient measures 3.0 mmHg. Aortic valve peak gradient measures 5.0 mmHg. Aortic valve area, by VTI measures 1.91 cm.  Pulmonic Valve: The pulmonic valve was not well visualized. Pulmonic valve regurgitation is mild. No evidence of pulmonic stenosis.  Aorta: The aortic root and ascending aorta are structurally normal, with no evidence of dilitation.  Venous: The inferior vena cava was not well visualized.  IAS/Shunts: The interatrial septum was not well visualized.   LEFT VENTRICLE PLAX 2D LVIDd:         4.60 cm LVIDs:         2.40 cm LV PW:         1.10 cm LV IVS:        1.10 cm LVOT diam:     1.80 cm LV SV:         46 LV SV Index:   21 LVOT Area:  2.54 cm   RIGHT VENTRICLE RV S prime:     11.70 cm/s TAPSE (M-mode): 1.8 cm  LEFT ATRIUM             Index        RIGHT ATRIUM           Index LA Vol (A2C):   92.0 ml 41.70 ml/m  RA Area:     28.70 cm LA Vol (A4C):   58.0 ml 26.29 ml/m  RA Volume:   91.70 ml  41.57 ml/m LA Biplane Vol: 79.7 ml 36.13 ml/m AORTIC VALVE AV Area (Vmax):    2.19 cm AV Area (Vmean):   2.18 cm AV Area (VTI):     1.91 cm AV Vmax:           112.00 cm/s AV Vmean:          84.300 cm/s AV VTI:            0.239 m AV Peak Grad:      5.0 mmHg AV Mean Grad:      3.0 mmHg LVOT Vmax:         96.60 cm/s LVOT Vmean:        72.300 cm/s LVOT VTI:          0.179 m LVOT/AV VTI ratio: 0.75  AORTA Ao Root diam: 3.20 cm Ao Asc diam:  3.60 cm  MITRAL VALVE                TRICUSPID VALVE MV Area  (PHT): 4.26 cm     TR Peak grad:   49.6 mmHg MV Decel Time: 178 msec     TR Vmax:        352.00 cm/s MV E velocity: 130.00 cm/s SHUNTS Systemic VTI:  0.18 m Systemic Diam: 1.80 cm  Riley Lam MD Electronically signed by Riley Lam MD Signature Date/Time: 11/11/2022/1:16:44 PM    Final             Risk Assessment/Calculations:      CHA2DS2-VASc Score = 6   This indicates a 9.7% annual risk of stroke. The patient's score is based upon: CHF History: 1 HTN History: 1 Diabetes History: 0 Stroke History: 0 Vascular Disease History: 1 Age Score: 2 Gender Score: 1         Physical Exam:   VS:  BP (!) 140/72   Pulse 94   Ht 5\' 8"  (1.727 m)   Wt 239 lb 8 oz (108.6 kg)   SpO2 91% Comment: 2 liters of O2  BMI 36.42 kg/m    Wt Readings from Last 3 Encounters:  02/09/23 239 lb 8 oz (108.6 kg)  11/28/22 231 lb (104.8 kg)  11/15/22 244 lb 11.4 oz (111 kg)    GEN: Well nourished, overweight, well developed in no acute distress NECK: No JVD; No carotid bruits CARDIAC: IRIR, no murmurs, rubs, gallops RESPIRATORY:  Clear to auscultation without rales, wheezing or rhonchi. On 2L O2. ABDOMEN: Soft, non-tender, non-distended EXTREMITIES:  No edema; No deformity   ASSESSMENT AND PLAN: .     Chronic hypoxic respiratory failure / HFpEF - 4 day history of dyspnea for which pulmonology has started Prednisone and abx. No adventitious lung sounds on exam. Recommend Muccinex rather than Muccinex-DM. Volume status difficult to ascertain based on body habitus however overall euvolemic on exam.  Continue torsemide 40 mg daily with additional 40 mg in the afternoon as needed for lower extremity edema. Low sodium diet, fluid restriction <2L,  and daily weights encouraged. Educated to contact our office for weight gain of 2 lbs overnight or 5 lbs in one week. .    Atrial fibrillation / Chronic anticoagulation - Plan for continued rate control. Continue present dose of Toprol  25 mg daily.  Continue Eliquis 5 mg twice daily. Denies bleeding complications. CHA2DS2-VASc Score = 6 [CHF History: 1, HTN History: 1, Diabetes History: 0, Stroke History: 0, Vascular Disease History: 1, Age Score: 2, Gender Score: 1].  Therefore, the patient's annual risk of stroke is 9.7 %.    Does not meet dose reduction criteria.    Chronic calcification on CT/Aortic atherosclerosis/HLD, LDL goal <70 -continue Atorvastatin. Stable with no anginal symptoms. No indication for ischemic evaluation.     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 Continue optimal BP control and Toprol. Recommend avoidance of fluoroquinolones.  Consider repeat CTA vs MRA for monitoring 10/2023.   Elevated BP - Likely due to illness and use of Muccinex-DM. Discussed to monitor BP at home at least 2 hours after medications and sitting for 5-10 minutes. Reassess at follow up.          Dispo: follow up in 2-3 mos  Signed, Alver Sorrow, NP

## 2023-02-11 ENCOUNTER — Encounter (HOSPITAL_BASED_OUTPATIENT_CLINIC_OR_DEPARTMENT_OTHER): Payer: Self-pay | Admitting: Family

## 2023-02-13 ENCOUNTER — Other Ambulatory Visit: Payer: Medicare Other

## 2023-02-13 ENCOUNTER — Telehealth: Payer: Self-pay

## 2023-02-13 DIAGNOSIS — I4819 Other persistent atrial fibrillation: Secondary | ICD-10-CM

## 2023-02-13 NOTE — Progress Notes (Signed)
Care Guide Pharmacy Note  02/13/2023 Name: Lindsay [name] MRN: [id number] DOB: 1940-06-01  Referred By: Tollie Eth, NP Reason for referral: Care Coordination (Outreach to schedule with Pharm d )   Lindsay [name] is a 83 y.o. year old female who is a primary care patient of Early, Sung Amabile, NP.  Lindsay [name] was referred to the pharmacist for assistance related to: CHF  Successful contact was made with the patient to discuss pharmacy services including being ready for the pharmacist to call at least 5 minutes before the scheduled appointment time and to have medication bottles and any blood pressure readings ready for review. The patient agreed to meet with the pharmacist via telephone visit on (date/time).02/13/2023 [name] , RMA     [place]  [facility], [facility] Guide  Direct Dial: [phone]  Website: [email or web address]

## 2023-02-13 NOTE — Progress Notes (Signed)
   02/13/2023  Patient ID: [name], female   DOB: April 27, 1940, 83 y.o.   MRN: [id number]  Contacted patient via telephone at the request of PCP to follow up on cost concerns with Eliquis.  Patient does not qualify for any patient assistance at this time as BMS requires 3% of household income be spent OOP on rx costs first. Xarelto requires 4% OOP and is also not an option. Other DOACs non-formulary with insurance and do not have assistance programs.  Contacted patient's insurance company. Patient's current cost to fill eliquis at this time is $420 dollars. $375 of that cost is a one time deductible that needs to be met for the new year. Once met, Eliquis would go down to $45/month.  Made patient aware that insurance company of this info and that insurance does offer a payment plan to help with cost of medications this year, patient would need to contact insurance company to setup.   Other option would be switching to Warfarin, which is not a tier 3/4/5 med and deductible should not apply.   Patient has 7-10 days of Eliquis on hand and wants to discuss with her daughter before proceeding. Will follow up in 2 days.  Sherrill Raring, PharmD Clinical Pharmacist [phone]

## 2023-02-15 ENCOUNTER — Other Ambulatory Visit: Payer: Medicare Other

## 2023-02-15 DIAGNOSIS — I4819 Other persistent atrial fibrillation: Secondary | ICD-10-CM

## 2023-02-15 DIAGNOSIS — I4891 Unspecified atrial fibrillation: Secondary | ICD-10-CM | POA: Diagnosis not present

## 2023-02-15 NOTE — Progress Notes (Signed)
   02/15/2023  Patient ID: Lindsay [name], female   DOB: 07-Nov-1940, 83 y.o.   MRN: [id number]  Reviewed the info below with patient. She has decided to setup a payment plan for her Eliquis with her insurance company and will contact us with any other needs or concerns.  Contacted patient via telephone at the request of PCP to follow up on cost concerns with Eliquis.  Patient does not qualify for any patient assistance at this time as BMS requires 3% of household income be spent OOP on rx costs first. Xarelto requires 4% OOP and is also not an option. Other DOACs non-formulary with insurance and do not have assistance programs.  Contacted patient's insurance company. Patient's current cost to fill eliquis at this time is $420 dollars. $375 of that cost is a one time deductible that needs to be met for the new year. Once met, Eliquis would go down to $45/month.  Made patient aware that insurance company of this info and that insurance does offer a payment plan to help with cost of medications this year, patient would need to contact insurance company to setup.   Other option would be switching to Warfarin, which is not a tier 3/4/5 med and deductible should not apply.     Sherrill Raring, PharmD Clinical Pharmacist [phone]

## 2023-02-16 ENCOUNTER — Telehealth: Payer: Self-pay | Admitting: Nurse Practitioner

## 2023-02-16 NOTE — Telephone Encounter (Signed)
Pt called to let you know that she was not feeling well last night and called EMS, they came out but did not feel she needed to go to hospital but told her to let you know that she was in A Fib Pt was due for follow up, scheduled for follow up and to check A Fib on 02/28/23. She will call with any issues before then

## 2023-02-16 NOTE — Telephone Encounter (Signed)
Pt informed

## 2023-02-22 ENCOUNTER — Encounter: Payer: Self-pay | Admitting: Nurse Practitioner

## 2023-02-22 ENCOUNTER — Ambulatory Visit: Payer: Self-pay

## 2023-02-22 DIAGNOSIS — L600 Ingrowing nail: Secondary | ICD-10-CM | POA: Diagnosis not present

## 2023-02-22 DIAGNOSIS — M79671 Pain in right foot: Secondary | ICD-10-CM | POA: Diagnosis not present

## 2023-02-22 DIAGNOSIS — I739 Peripheral vascular disease, unspecified: Secondary | ICD-10-CM | POA: Diagnosis not present

## 2023-02-22 DIAGNOSIS — B351 Tinea unguium: Secondary | ICD-10-CM | POA: Diagnosis not present

## 2023-02-23 NOTE — Patient Outreach (Signed)
  Care Coordination   Follow Up Visit Note   02/22/2023 Name: Lindsay [name] MRN: [id number] DOB: June 29, 1940  Lindsay [name] is a 83 y.o. year old female who sees Early, Sung Amabile, NP for primary care. I spoke with  Lindsay [name] by phone today.  What matters to the patients health and wellness today?  Patient would like to discuss several health related concerns with her PCP at her next scheduled visit.     Goals Addressed             This Visit's Progress    RN Care Coordination Activities - further follow up needed   On track    Care Coordination Interventions: Evaluation of current treatment plan related to CHF and COPD and patient's adherence to plan as established by provider Confirmed patient completed her in person f/u with Heart and Vascular as scheduled Review of patient status, including review of consultant's reports, relevant laboratory and other test results, and medications completed Discussed recent episode with patient when she called 911 due to having worsening shortness of breath and weakness, she was advised she was in atrial fib, she declined to go to the hospital for evaluation, her symptoms improved with rest Reviewed and discussed patient's upcoming scheduled follow up with PCP scheduled for 02/28/23 to have symptoms further evaluated  Counseled on increased risk of stroke due to Afib and benefits of anticoagulation for stroke prevention Reviewed importance of adherence to anticoagulant exactly as prescribed Counseled on avoidance of NSAIDs due to increased bleeding risk with anticoagulants Afib action plan reviewed Sent in basket message to PCP to advise of patient's request for GI referral and hearing aids Confirmed patient received a call from the [facility] Pharmacist to discuss financial options for cost of Eliquis, patient will pay her copay in 2 split payments in order to have her new monthly cost drop to $45 for a monthly supply of Eliquis Discussed plans with patient for ongoing  care coordination follow up and provided patient with direct contact information for nurse care coordinator      Interventions Today    Flowsheet Row Most Recent Value  Chronic Disease   Chronic disease during today's visit Other, Atrial Fibrillation (AFib)  [hearing loss,  swallowing]  General Interventions   General Interventions Discussed/Reviewed General Interventions Discussed, General Interventions Reviewed, Doctor Visits, Communication with, Durable Medical Equipment (DME)  Doctor Visits Discussed/Reviewed Doctor Visits Discussed, Doctor Visits Reviewed, PCP, Specialist  Durable Medical Equipment (DME) Oxygen  Communication with PCP/Specialists  Lindsay Nim, NP]  Education Interventions   Education Provided Provided Education  Provided Verbal Education On Nutrition, When to see the doctor, Medication  Nutrition Interventions   Nutrition Discussed/Reviewed Nutrition Discussed, Nutrition Reviewed  Pharmacy Interventions   Pharmacy Dicussed/Reviewed Pharmacy Topics Reviewed, Pharmacy Topics Discussed, Medications and their functions, Affording Medications, Medication Adherence          SDOH assessments and interventions completed:  Yes  SDOH Interventions Today    Flowsheet Row Most Recent Value  SDOH Interventions   Food Insecurity Interventions Intervention Not Indicated  Housing Interventions Intervention Not Indicated  Transportation Interventions Intervention Not Indicated  Utilities Interventions Intervention Not Indicated        Care Coordination Interventions:  Yes, provided   Follow up plan: Follow up call scheduled for 03/15/23 @09 :45 AM    Encounter Outcome:  Patient Visit Completed

## 2023-02-23 NOTE — Patient Instructions (Signed)
Visit Information  Thank you for taking time to visit with me today. Please don't hesitate to contact me if I can be of assistance to you.   Following are the goals we discussed today:   Goals Addressed             This Visit's Progress    RN Care Coordination Activities - further follow up needed   On track    Care Coordination Interventions: Evaluation of current treatment plan related to CHF and COPD and patient's adherence to plan as established by provider Confirmed patient completed her in person f/u with Heart and Vascular as scheduled Review of patient status, including review of consultant's reports, relevant laboratory and other test results, and medications completed Discussed recent episode with patient when she called 911 due to having worsening shortness of breath and weakness, she was advised she was in atrial fib, she declined to go to the hospital for evaluation, her symptoms improved with rest Reviewed and discussed patient's upcoming scheduled follow up with PCP scheduled for 02/28/23 to have symptoms further evaluated  Counseled on increased risk of stroke due to Afib and benefits of anticoagulation for stroke prevention Reviewed importance of adherence to anticoagulant exactly as prescribed Counseled on avoidance of NSAIDs due to increased bleeding risk with anticoagulants Afib action plan reviewed Sent in basket message to PCP to advise of patient's request for GI referral and hearing aids Confirmed patient received a call from the [facility] Pharmacist to discuss financial options for cost of Eliquis, patient will pay her copay in 2 split payments in order to have her new monthly cost drop to $45 for a monthly supply of Eliquis Discussed plans with patient for ongoing care coordination follow up and provided patient with direct contact information for nurse care coordinator          Our next appointment is by telephone on 03/15/23 at 09:45 AM  Please call the care guide  team at [phone] if you need to cancel or reschedule your appointment.   If you are experiencing a Mental Health or Behavioral Health Crisis or need someone to talk to, please call 1-800-273-TALK (toll free, 24 hour hotline)  Patient verbalizes understanding of instructions and care plan provided today and agrees to view in MyChart. Active MyChart status and patient understanding of how to access instructions and care plan via MyChart confirmed with patient.     Lindsay Sale RN BSN CCM [place]  [facility], [facility] Nurse Care Coordinator  Direct Dial: [phone] Website: [name]

## 2023-02-27 ENCOUNTER — Telehealth: Payer: Self-pay | Admitting: Pulmonary Disease

## 2023-02-27 NOTE — Telephone Encounter (Signed)
Spoke with the pt and scheduled for acute visit with Dr [name] for 4 pm tomorrow ok per JD.

## 2023-02-27 NOTE — Telephone Encounter (Signed)
Patient has chest congestion and a dry cough and would like something to be called in for it.   Pharmacy: [facility]

## 2023-02-28 ENCOUNTER — Ambulatory Visit: Payer: Medicare Other | Admitting: Primary Care

## 2023-02-28 ENCOUNTER — Encounter: Payer: Self-pay | Admitting: Pulmonary Disease

## 2023-02-28 ENCOUNTER — Ambulatory Visit: Payer: Medicare Other | Admitting: Pulmonary Disease

## 2023-02-28 ENCOUNTER — Encounter: Payer: Medicare Other | Admitting: Nurse Practitioner

## 2023-02-28 VITALS — BP 112/64 | HR 84 | Ht 69.0 in | Wt 234.8 lb

## 2023-02-28 DIAGNOSIS — J441 Chronic obstructive pulmonary disease with (acute) exacerbation: Secondary | ICD-10-CM

## 2023-02-28 DIAGNOSIS — J9611 Chronic respiratory failure with hypoxia: Secondary | ICD-10-CM | POA: Diagnosis not present

## 2023-02-28 DIAGNOSIS — I5032 Chronic diastolic (congestive) heart failure: Secondary | ICD-10-CM | POA: Diagnosis not present

## 2023-02-28 MED ORDER — DOXYCYCLINE HYCLATE 100 MG PO TABS
100.0000 mg | ORAL_TABLET | Freq: Two times a day (BID) | ORAL | 0 refills | Status: DC
Start: 2023-02-28 — End: 2023-03-22

## 2023-02-28 MED ORDER — PREDNISONE 10 MG PO TABS: ORAL_TABLET | ORAL | 0 refills | Status: AC

## 2023-02-28 NOTE — Patient Instructions (Addendum)
 Start extended steroid taper 40mg  daily x 3 days 30mg  daily x 3 days 20mg  daily x 3 days 10mg  daily x 3 days  Start doxycycline  1 tab twice daily  Increase your torsemide  to 2 tablets twice daily for the next 2-3 days, then back to 2 tablets daily  We will refer you to the Cardiology team for evaluation of your heart failure  Follow up in 2 months

## 2023-02-28 NOTE — Progress Notes (Signed)
 Synopsis: Referred in January 2022 for chronic respiratory failure  Subjective:   PATIENT ID: Dagoberto Pass GENDER: female DOB: [birth year], MRN: [id number]  HPI  Chief Complaint  Patient presents with   Acute Visit    Pt is having sob during exertion w/ pain in right side when coughing or breathing. Pt has congestion in chest (clear). 5 days experiencing symptoms. Poc & continuous oxygen  2L   Cyan Clippinger is a 83 year old woman, never smoker with chronic respiratory failure on 2L of supplemental oxygen  who returns to pulmonary clinic for follow up.  She has been experiencing a worsening cough and shortness of breath for the past five days. Initially, she delayed seeking medical attention, hoping for improvement, but now experiences significant shortness of breath with minimal exertion, such as walking from her recliner to the kitchen or bathroom. She describes right-sided chest pain that worsens with coughing or movement.  In January, she was prescribed an antibiotic and a steroid, which provided relief, but her symptoms have recurred after completing the course. She has been off these medications for two to three weeks, and her symptoms have intensified over the last five days. She brings up clear mucus and has not experienced significant weight changes, noting only a fluctuation of about two pounds.  She uses a Trelegy inhaler once daily and nebulizer treatments, though she is uncertain of their effectiveness. She occasionally increases her oxygen  level to three when feeling breathless. No recent changes in her oxygen  needs and she is able to sleep on her side without difficulty.  She experiences leg swelling, which she manages with torsemide , taking 40 mg daily in the morning. She sometimes takes additional doses at night if her feet are swollen, but this is infrequent.  She moved from [place]  and has been experiencing challenges in accessing medical care in her new location. She does not have  a current cardiologist and has been managing her torsemide  with her primary care provider. She has difficulty accessing a doctor since moving to the area, often seeing physician assistants instead.  OV 09/29/22 She was treated with prednisone  40mg  x 5 days and Zpak for recent call with increase of dyspnea, cough - mucous production and lower extremity edema. She was also instructed to increase her torsemide  for 3 days due to swelling. She is feeling better. She is complaining of scratchy hoarse voice especially after talking for any period of time. She has not had issues with her voice since being on trelegy.   She is interested in getting her flu vaccine today.  OV 05/30/22 She went to ER 05/21/22 for cough, dysphagia. Referred to GI. Placed on soft diet. She is feeling better with less cough and upper airway congestion.   She has skin tear on left shin from 3 days ago.   OV 12/20/21 She was treated for asthma exacerbation 12/07/21 by Izetta Rouleau, NP likely secondary to viral infeciton. She was treated with prednisone  taper and azithromycin . She is not feeling any better. She continues to have chest congestion and is having trouble coughing it out. She is using zyrtec  and mucinex . She continues on trelegy ellipta  1 puff daily. She also complains of decreased hearing of her right ear and sinus pressure/headaches.   OV 09/22/21 She was admitted to the hospital after last visit, 4/17 to 4/21 heart failure exacerbation. She was metapneumovirus postive on extended respiratory viral panel testing leading to acute on chronic resp failure and acute on chronic heart failure.  She reports  feeling bad over the past 4-5 days with chest congestion, cough, fatigue, nausea and lower than normal blood pressure. She reports 2 covid cases at her [facility] center but denies direct exposure or contact. She denies fevers or chills. She has increased wheezing.   She is requesting a handicap placard.  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 There was also concern for asthma in which she was started on trelegy ellipta .   She reports increasing dyspnea and wheezing over the past 3 days. She has noted O2 saturations in the 70s when walking at home on 2L O2. She has not been able to walk as far recently and required a wheelchair to enter clinic today. Her SpO2 was confirmed in clinic to drop in the 70s with ambulation this morning. She has been using duoneb treatments every 6 hours and taking extra lasix  doses without improvement in her breathing. She also complains of painful urination and inreased urinary frequency over the past 2 days concerning for UTI. She reports falling at home and scraping her left elbow. She has been feeling lightheaded.   OV 02/17/2020 She reports she started having issues with her breathing 2 years ago after she suffered compound fractures in her back that led to an increase in the curvature of her thoracic spine. She was started on supplemental oxygen  shortly after these events occurred. Per her chart she has exertional oxygen  desaturations to 87% from her physical therapy notes. She also reports cough and wheezing intermittently. She denies sputum production. She is a never smoker but lived with a smoker for 20 years. She denies history of pneumonias.   She has been started on trelegy ellipta  and she felt this helped her for a period of time but doesn't notice lasting relief from it anymore.   She has atrial fibrillation. She does have leg swelling and is taking torsemide .   She has received 2 covid vaccines. She received her pneumonia vaccine 2 weeks ago and she had the influenza vaccine this past fall.     She lived in [place]  before but moved to [place] as she had frequent falls and lived on her own. Her daughter lives here in town.   Past Medical History:  Diagnosis Date   Acute diastolic CHF (congestive heart failure)  (HCC) 08/03/2020   Acute non-recurrent pansinusitis 10/21/2020   Acute on chronic respiratory failure with hypoxia (HCC) 08/02/2020   Asthma    Asthma exacerbation 08/02/2020   Atrial fibrillation (HCC)    Dysphagia 08/02/2020   Dyspnea 05/09/2021   Elevated troponin 09/14/2020   High cholesterol    HTN (hypertension)    Hyperkalemia 09/14/2020   Hyponatremia 08/02/2020   Low grade squamous intraepithelial lesion on cytologic smear of cervix (LGSIL) 12/02/2019   Moderate persistent asthma without complication 12/02/2019   Non-healing wound of right lower extremity 08/01/2022   Respiratory failure with hypoxia (HCC)    Sore throat 11/16/2020     Family History  Problem Relation Age of Onset   Breast cancer Sister    Hypertension Other      Social History   Socioeconomic History   Marital status: Divorced    Spouse name: Not on file   Number of children: Not on file   Years of education: Not on file   Highest education level: Not on file  Occupational History   Not on file  Tobacco Use   Smoking status: Former    Types: Cigarettes  Smokeless tobacco: Never   Tobacco comments:    Quit remotely 1970s  Vaping Use   Vaping status: Never Used  Substance and Sexual Activity   Alcohol use: Not Currently   Drug use: Never   Sexual activity: Not Currently  Other Topics Concern   Not on file  Social History Narrative   Not on file   Social Drivers of Health   Financial Resource Strain: Low Risk  (07/10/2022)   Overall Financial Resource Strain (CARDIA)    Difficulty of Paying Living Expenses: Not very hard  Food Insecurity: No Food Insecurity (02/22/2023)   Hunger Vital Sign    Worried About Running Out of Food in the Last Year: Never true    Ran Out of Food in the Last Year: Never true  Transportation Needs: No Transportation Needs (02/22/2023)   PRAPARE - Administrator, Civil Service (Medical): No    Lack of Transportation (Non-Medical): No  Recent  Concern: Transportation Needs - Unmet Transportation Needs (12/08/2022)   PRAPARE - Transportation    Lack of Transportation (Medical): Yes    Lack of Transportation (Non-Medical): Yes  Physical Activity: Insufficiently Active (12/08/2022)   Exercise Vital Sign    Days of Exercise per Week: 7 days    Minutes of Exercise per Session: 20 min  Stress: No Stress Concern Present (12/08/2022)   [employer] of Occupational Health - Occupational Stress Questionnaire    Feeling of Stress : Only a little  Social Connections: Unknown (07/10/2022)   Social Connection and Isolation Panel [NHANES]    Frequency of Communication with Friends and Family: Once a week    Frequency of Social Gatherings with Friends and Family: Once a week    Attends Religious Services: Not on Marketing Executive or Organizations: No    Attends Banker Meetings: Never    Marital Status: Divorced  Catering Manager Violence: Not At Risk (02/22/2023)   Humiliation, Afraid, Rape, and Kick questionnaire    Fear of Current or Ex-Partner: No    Emotionally Abused: No    Physically Abused: No    Sexually Abused: No     Allergies  Allergen Reactions   Acetaminophen -Codeine Other (See Comments)    Agitation   Naproxen-Esomeprazole Mg Diarrhea   Codeine Other (See Comments)    Hallucinations      Outpatient Medications Prior to Visit  Medication Sig Dispense Refill   alendronate  (FOSAMAX ) 70 MG tablet Take 1 tablet (70 mg total) by mouth once a week. Take with a full glass of water on an empty stomach. (Patient taking differently: Take 70 mg by mouth every Saturday. Take with a full glass of water on an empty stomach.) 12 tablet 1   azithromycin  (ZITHROMAX ) 250 MG tablet Take as directed 6 tablet 0   Blood Glucose Monitoring Suppl DEVI 1 each by Does not apply route in the morning, at noon, and at bedtime. May substitute to any manufacturer covered by patient's insurance. 1 each 0    Calcium -Magnesium -Vitamin D  (CALCIUM  1200+D3 PO) Take 1 tablet by mouth in the morning.     cetirizine  (ZYRTEC ) 10 MG tablet TAKE 1 TABLET BY MOUTH EVERY DAY 30 tablet 11   Cyanocobalamin  (B-12) 1000 MCG SUBL Place 1 tablet under the tongue daily at 6 (six) AM. 90 tablet 3   Elastic Bandages & Supports (MEDICAL COMPRESSION STOCKINGS) MISC Two pair knee high 30-37mmHg compression stockings. Sized XL. As covered by insurance for ICD-10:  I50.33, R60.0. 2 each 1   fluticasone  (FLONASE ) 50 MCG/ACT nasal spray Place 1 spray into both nostrils 2 (two) times daily as needed for allergies or rhinitis.     Fluticasone -Umeclidin-Vilant (TRELEGY ELLIPTA ) 100-62.5-25 MCG/ACT AEPB Inhale 1 puff into the lungs daily. 180 each 3   Glucose Blood (BLOOD GLUCOSE TEST STRIPS) STRP For monitoring blood sugar levels up to 4 times a day. May substitute to any manufacturer covered by patient's insurance. 200 strip 99   guaiFENesin  (MUCINEX ) 600 MG 12 hr tablet TAKE 1 TABLET BY MOUTH 2 TIMES DAILY (Patient taking differently: Take 600 mg by mouth 2 (two) times daily as needed for to loosen phlegm or cough.) 30 tablet 0   ipratropium-albuterol  (DUONEB) 0.5-2.5 (3) MG/3ML SOLN Inhale 3 mLs into the lungs every 6 (six) hours as needed (wheezing). 75 mL 12   Lancet Device MISC For monitoring blood sugar up to 4 times a day. May substitute to any manufacturer covered by patient's insurance. 200 each 99   Lancets Misc. MISC For monitoring blood sugar up to 4 times a day. May substitute to any manufacturer covered by patient's insurance. 200 each 99   LORazepam  (ATIVAN ) 1 MG tablet TAKE 1 TABLET BY MOUTH 2 TIMES DAILY as needed for anxiety 60 tablet 2   Menthol (COUGH DROPS) 5.8 MG LOZG Use as directed 1 lozenge (5.8 mg total) in the mouth or throat as needed (for dry, sore, or scratchy throat). 36 lozenge 11   mirtazapine  (REMERON ) 45 MG tablet TAKE 1 TABLET BY MOUTH AT BEDTIME 90 tablet 0   montelukast  (SINGULAIR ) 10 MG tablet Take  10 mg by mouth at bedtime.     Multiple Vitamin (MULTIVITAMIN WITH MINERALS) TABS tablet Take 1 tablet by mouth daily with breakfast.     OXYGEN  Inhale 2 L/min into the lungs continuous.     pantoprazole  (PROTONIX ) 20 MG tablet TAKE 1 TABLET BY MOUTH EVERY DAY 90 tablet 1   potassium chloride  (KLOR-CON  M) 20 MEQ tablet Take 1 tablet (20 mEq total) by mouth daily. 90 tablet 3   sertraline  (ZOLOFT ) 100 MG tablet TAKE 1 TABLET BY MOUTH AT BEDTIME 90 tablet 1   Spacer/Aero-Holding Chambers (AEROCHAMBER MV) inhaler Use as instructed 1 each 0   torsemide  (DEMADEX ) 20 MG tablet TAKE 2 TABLETS DAILY EVERY MORNING. TAKE AN ADDITIONAL 2 TABLETS AS NEEDED FOR WEIGHT GAIN OF 2 POUNDS OVERNIGHT OR 5 POUNDS IN ONE WEEK. 230 tablet 1   apixaban  (ELIQUIS ) 5 MG TABS tablet Take 1 tablet (5 mg total) by mouth 2 (two) times daily. 60 tablet 5   metoprolol  tartrate (LOPRESSOR ) 25 MG tablet Take 25 mg by mouth in the morning and at bedtime.     atorvastatin  (LIPITOR) 40 MG tablet Take 1 tablet (40 mg total) by mouth daily. 90 tablet 3   No facility-administered medications prior to visit.   Review of Systems  Constitutional:  Negative for chills, fever, malaise/fatigue and weight loss.  HENT:  Negative for congestion, hearing loss, sinus pain and sore throat.   Eyes: Negative.   Respiratory:  Positive for cough, sputum production and shortness of breath. Negative for hemoptysis and wheezing.   Cardiovascular:  Negative for chest pain, palpitations, orthopnea, claudication and leg swelling.  Gastrointestinal:  Negative for abdominal pain, heartburn, nausea and vomiting.  Genitourinary: Negative.   Musculoskeletal:  Negative for back pain, falls, joint pain and myalgias.  Skin:  Negative for rash.  Neurological:  Negative for dizziness and weakness.  Endo/Heme/Allergies: Negative.   Psychiatric/Behavioral: Negative.      Objective:   Vitals:   02/28/23 1547  BP: 112/64  Pulse: 84  SpO2: 91%  Weight: 234  lb 12.8 oz (106.5 kg)  Height: 5' 9 (1.753 m)      Physical Exam Constitutional:      General: She is not in acute distress.    Appearance: She is obese.  HENT:     Head: Normocephalic and atraumatic.  Eyes:     General: No scleral icterus.    Conjunctiva/sclera: Conjunctivae normal.  Cardiovascular:     Rate and Rhythm: Normal rate and regular rhythm.     Pulses: Normal pulses.     Heart sounds: Normal heart sounds. No murmur heard. Pulmonary:     Breath sounds: Decreased air movement present. No wheezing, rhonchi or rales.  Musculoskeletal:     Right lower leg: No edema.     Left lower leg: No edema.     Comments: Scoliosis noted  Skin:    General: Skin is warm and dry.  Neurological:     Mental Status: She is alert.    CBC    Component Value Date/Time   WBC 8.1 03/01/2023 1044   RBC 4.28 03/01/2023 1044   HGB 12.2 03/01/2023 1044   HGB 12.3 11/28/2022 1452   HCT 37.3 03/01/2023 1044   HCT 37.2 11/28/2022 1452   PLT 276.0 03/01/2023 1044   PLT 255 11/28/2022 1452   MCV 87.1 03/01/2023 1044   MCV 89 11/28/2022 1452   MCH 29.4 11/28/2022 1452   MCH 29.2 11/15/2022 0524   MCHC 32.9 03/01/2023 1044   RDW 16.6 (H) 03/01/2023 1044   RDW 15.1 11/28/2022 1452   LYMPHSABS 0.5 (L) 03/01/2023 1044   LYMPHSABS 1.0 11/28/2022 1452   MONOABS 0.4 03/01/2023 1044   EOSABS 0.2 03/01/2023 1044   EOSABS 0.1 11/28/2022 1452   BASOSABS 0.0 03/01/2023 1044   BASOSABS 0.0 11/28/2022 1452      Latest Ref Rng & Units 03/01/2023   10:44 AM 11/28/2022    2:52 PM 11/16/2022    5:06 AM  BMP  Glucose 70 - 99 mg/dL 889  94  89   BUN 6 - 23 mg/dL 17  18  30    Creatinine 0.40 - 1.20 mg/dL 9.00  9.03  8.95   BUN/Creat Ratio 12 - 28  19    Sodium 135 - 145 mEq/L 141  142  140   Potassium 3.5 - 5.1 mEq/L 3.5  3.1  4.5   Chloride 96 - 112 mEq/L 98  96  99   CO2 19 - 32 mEq/L 33  29  31   Calcium  8.4 - 10.5 mg/dL 9.1  9.0  9.2    Chest imaging: CTA Chest 05/21/22 1. No pulmonary  embolus. 2. Cardiomegaly 3. Stable aneurysmal aortic arch (4 cm). Recommend semi-annual imaging followup by CTA or MRA and referral to cardiothoracic surgery if not already obtained. This recommendation follows 2010 ACCF/AHA/AATS/ACR/ASA/SCA/SCAI/SIR/STS/SVM Guidelines for the Diagnosis and Management of Patients With Thoracic Aortic Disease. Circulation. 2010; 121: Z733-z63. Aortic aneurysm NOS (ICD10-I71.9). 4. No acute intrapulmonary abnormality. 5. Aortic Atherosclerosis (ICD10-I70.0) with at least 2 vessel coronary artery calcifications.  CXR 12/07/21 Frontal and lateral views of the chest demonstrate an enlarged cardiac silhouette. There is ectasia of the thoracic aorta. There is increased central vascular congestion without overt edema. No effusion or pneumothorax. The lungs remain hyperinflated. Multiple thoracic compression deformities are again seen, with  exaggerated thoracic kyphosis.  CXR 12/16/19 The lungs are under aerated with bronchovascular crowding and interstitial prominence. Increased AP diameter on the lateral view. No focal airspace opacities.  Minimal blunting of both costophrenic angles.  Mild enlargement of the cardiac silhouette.  Prominent central vessels. The thoracic aorta is markedly tortuous. Moderate scattered bony degenerative changes. Increased kyphosis with decreased height of numerous thoracic vertebral bodies. Levoscoliosis of the upper thoracic spine. Diffuse decreased bone density.  PFT:     No data to display          Labs: Reviewed from [facility] 1.11 on 12/02/19 Normal CBC  Assessment & Plan:   Chronic respiratory failure with hypoxia (HCC)  COPD with acute exacerbation (HCC) - Plan: predniSONE  (DELTASONE ) 10 MG tablet, doxycycline  (VIBRA -TABS) 100 MG tablet  Chronic diastolic congestive heart failure (HCC) - Plan: Ambulatory referral to Cardiology, CBC with Differential/Platelet, Comp Met (CMET), B Nat Peptide, B  Nat Peptide, Comp Met (CMET), CBC with Differential/Platelet  Discussion: Shamiah Kahler is a 83 year old woman, never smoker with chronic respiratory failure on 2L of supplemental oxygen  who returns to pulmonary clinic for asthma.  Chronic Obstructive Pulmonary Disease with exacerbation Patient is on Trelegy inhaler and nebulizer treatments, but reports no significant improvement in symptoms. -Continue current inhaler and nebulizer treatments. -start extended prednisone  taper - start doxycycline  1 tab twice daily  Congestive Heart Failure Patient reports increased shortness of breath and leg swelling, currently managed with 40mg  of Torsemide  daily. No significant weight changes noted. -Increase Torsemide  to three tablets in the morning and two at night for the next two days to manage fluid retention and assess response. -Referl to a cardiologist for further management. -Attempt to obtain labs to check kidney function in relation to increased diuretic use, if feasible.  Follow up in 2 months  Dorn Chill, MD [facility] Office: [phone]    Current Outpatient Medications:    alendronate  (FOSAMAX ) 70 MG tablet, Take 1 tablet (70 mg total) by mouth once a week. Take with a full glass of water on an empty stomach. (Patient taking differently: Take 70 mg by mouth every Saturday. Take with a full glass of water on an empty stomach.), Disp: 12 tablet, Rfl: 1   azithromycin  (ZITHROMAX ) 250 MG tablet, Take as directed, Disp: 6 tablet, Rfl: 0   Blood Glucose Monitoring Suppl DEVI, 1 each by Does not apply route in the morning, at noon, and at bedtime. May substitute to any manufacturer covered by patient's insurance., Disp: 1 each, Rfl: 0   Calcium -Magnesium -Vitamin D  (CALCIUM  1200+D3 PO), Take 1 tablet by mouth in the morning., Disp: , Rfl:    cetirizine  (ZYRTEC ) 10 MG tablet, TAKE 1 TABLET BY MOUTH EVERY DAY, Disp: 30 tablet, Rfl: 11   Cyanocobalamin  (B-12) 1000 MCG  SUBL, Place 1 tablet under the tongue daily at 6 (six) AM., Disp: 90 tablet, Rfl: 3   doxycycline  (VIBRA -TABS) 100 MG tablet, Take 1 tablet (100 mg total) by mouth 2 (two) times daily., Disp: 14 tablet, Rfl: 0   Elastic Bandages & Supports (MEDICAL COMPRESSION STOCKINGS) MISC, Two pair knee high 30-66mmHg compression stockings. Sized XL. As covered by insurance for ICD-10: I50.33, R60.0., Disp: 2 each, Rfl: 1   fluticasone  (FLONASE ) 50 MCG/ACT nasal spray, Place 1 spray into both nostrils 2 (two) times daily as needed for allergies or rhinitis., Disp: , Rfl:    Fluticasone -Umeclidin-Vilant (TRELEGY ELLIPTA ) 100-62.5-25 MCG/ACT AEPB, Inhale 1 puff into the lungs daily.,  Disp: 180 each, Rfl: 3   Glucose Blood (BLOOD GLUCOSE TEST STRIPS) STRP, For monitoring blood sugar levels up to 4 times a day. May substitute to any manufacturer covered by patient's insurance., Disp: 200 strip, Rfl: 99   guaiFENesin  (MUCINEX ) 600 MG 12 hr tablet, TAKE 1 TABLET BY MOUTH 2 TIMES DAILY (Patient taking differently: Take 600 mg by mouth 2 (two) times daily as needed for to loosen phlegm or cough.), Disp: 30 tablet, Rfl: 0   ipratropium-albuterol  (DUONEB) 0.5-2.5 (3) MG/3ML SOLN, Inhale 3 mLs into the lungs every 6 (six) hours as needed (wheezing)., Disp: 75 mL, Rfl: 12   Lancet Device MISC, For monitoring blood sugar up to 4 times a day. May substitute to any manufacturer covered by patient's insurance., Disp: 200 each, Rfl: 99   Lancets Misc. MISC, For monitoring blood sugar up to 4 times a day. May substitute to any manufacturer covered by patient's insurance., Disp: 200 each, Rfl: 99   LORazepam  (ATIVAN ) 1 MG tablet, TAKE 1 TABLET BY MOUTH 2 TIMES DAILY as needed for anxiety, Disp: 60 tablet, Rfl: 2   Menthol (COUGH DROPS) 5.8 MG LOZG, Use as directed 1 lozenge (5.8 mg total) in the mouth or throat as needed (for dry, sore, or scratchy throat)., Disp: 36 lozenge, Rfl: 11   mirtazapine  (REMERON ) 45 MG tablet, TAKE 1 TABLET  BY MOUTH AT BEDTIME, Disp: 90 tablet, Rfl: 0   montelukast  (SINGULAIR ) 10 MG tablet, Take 10 mg by mouth at bedtime., Disp: , Rfl:    Multiple Vitamin (MULTIVITAMIN WITH MINERALS) TABS tablet, Take 1 tablet by mouth daily with breakfast., Disp: , Rfl:    OXYGEN , Inhale 2 L/min into the lungs continuous., Disp: , Rfl:    pantoprazole  (PROTONIX ) 20 MG tablet, TAKE 1 TABLET BY MOUTH EVERY DAY, Disp: 90 tablet, Rfl: 1   potassium chloride  (KLOR-CON  M) 20 MEQ tablet, Take 1 tablet (20 mEq total) by mouth daily., Disp: 90 tablet, Rfl: 3   predniSONE  (DELTASONE ) 10 MG tablet, Take 4 tablets (40 mg total) by mouth daily with breakfast for 3 days, THEN 3 tablets (30 mg total) daily with breakfast for 3 days, THEN 2 tablets (20 mg total) daily with breakfast for 3 days, THEN 1 tablet (10 mg total) daily with breakfast for 3 days., Disp: 30 tablet, Rfl: 0   sertraline  (ZOLOFT ) 100 MG tablet, TAKE 1 TABLET BY MOUTH AT BEDTIME, Disp: 90 tablet, Rfl: 1   Spacer/Aero-Holding Chambers (AEROCHAMBER MV) inhaler, Use as instructed, Disp: 1 each, Rfl: 0   torsemide  (DEMADEX ) 20 MG tablet, TAKE 2 TABLETS DAILY EVERY MORNING. TAKE AN ADDITIONAL 2 TABLETS AS NEEDED FOR WEIGHT GAIN OF 2 POUNDS OVERNIGHT OR 5 POUNDS IN ONE WEEK., Disp: 230 tablet, Rfl: 1   apixaban  (ELIQUIS ) 5 MG TABS tablet, Take 1 tablet (5 mg total) by mouth 2 (two) times daily., Disp: 180 tablet, Rfl: 1   atorvastatin  (LIPITOR) 40 MG tablet, Take 1 tablet (40 mg total) by mouth daily., Disp: 90 tablet, Rfl: 3   metoprolol  tartrate (LOPRESSOR ) 25 MG tablet, TAKE 1 TABLET BY MOUTH 2 TIMES DAILY, Disp: 180 tablet, Rfl: 1

## 2023-03-01 LAB — COMPREHENSIVE METABOLIC PANEL
ALT: 15 U/L (ref 0–35)
AST: 17 U/L (ref 0–37)
Albumin: 3.7 g/dL (ref 3.5–5.2)
Alkaline Phosphatase: 87 U/L (ref 39–117)
BUN: 17 mg/dL (ref 6–23)
CO2: 33 meq/L — ABNORMAL HIGH (ref 19–32)
Calcium: 9.1 mg/dL (ref 8.4–10.5)
Chloride: 98 meq/L (ref 96–112)
Creatinine, Ser: 0.99 mg/dL (ref 0.40–1.20)
GFR: 53.06 mL/min — ABNORMAL LOW (ref >60.00)
Glucose, Bld: 110 mg/dL — ABNORMAL HIGH (ref 70–99)
Potassium: 3.5 meq/L (ref 3.5–5.1)
Sodium: 141 meq/L (ref 135–145)
Total Bilirubin: 0.8 mg/dL (ref 0.2–1.2)
Total Protein: 7.1 g/dL (ref 6.0–8.3)

## 2023-03-01 LAB — CBC WITH DIFFERENTIAL/PLATELET
Basophils Absolute: 0 10*3/uL (ref 0.0–0.1)
Basophils Relative: 0.5 % (ref 0.0–3.0)
Eosinophils Absolute: 0.2 10*3/uL (ref 0.0–0.7)
Eosinophils Relative: 2.9 % (ref 0.0–5.0)
HCT: 37.3 % (ref 36.0–46.0)
Hemoglobin: 12.2 g/dL (ref 12.0–15.0)
Lymphocytes Relative: 6.6 % — ABNORMAL LOW (ref 12.0–46.0)
Lymphs Abs: 0.5 10*3/uL — ABNORMAL LOW (ref 0.7–4.0)
MCHC: 32.9 g/dL (ref 30.0–36.0)
MCV: 87.1 fL (ref 78.0–100.0)
Monocytes Absolute: 0.4 10*3/uL (ref 0.1–1.0)
Monocytes Relative: 5.5 % (ref 3.0–12.0)
Neutro Abs: 6.9 10*3/uL (ref 1.4–7.7)
Neutrophils Relative %: 84.5 % — ABNORMAL HIGH (ref 43.0–77.0)
Platelets: 276 10*3/uL (ref 150.0–400.0)
RBC: 4.28 Mil/uL (ref 3.87–5.11)
RDW: 16.6 % — ABNORMAL HIGH (ref 11.5–15.5)
WBC: 8.1 10*3/uL (ref 4.0–10.5)

## 2023-03-01 LAB — BRAIN NATRIURETIC PEPTIDE: Pro B Natriuretic peptide (BNP): 195 pg/mL — ABNORMAL HIGH (ref 0.0–100.0)

## 2023-03-05 ENCOUNTER — Other Ambulatory Visit: Payer: Self-pay | Admitting: Nurse Practitioner

## 2023-03-07 ENCOUNTER — Telehealth: Payer: Self-pay | Admitting: Pulmonary Disease

## 2023-03-07 ENCOUNTER — Telehealth: Payer: Self-pay | Admitting: Family

## 2023-03-07 ENCOUNTER — Other Ambulatory Visit (HOSPITAL_BASED_OUTPATIENT_CLINIC_OR_DEPARTMENT_OTHER): Payer: Self-pay | Admitting: Family

## 2023-03-07 DIAGNOSIS — I480 Paroxysmal atrial fibrillation: Secondary | ICD-10-CM

## 2023-03-07 MED ORDER — APIXABAN 5 MG PO TABS: 5.0000 mg | ORAL_TABLET | Freq: Two times a day (BID) | ORAL | 1 refills | Status: DC

## 2023-03-07 NOTE — Telephone Encounter (Signed)
Patient calling the office for samples of medication:   1.  What medication and dosage are you requesting samples for? apixaban (ELIQUIS) 5 MG TABS tablet   2.  Are you currently out of this medication? No but pt will be out on 2/15 and needs samples to get her through 2/24. She'd like a call once ready for pick up.  Please advise.

## 2023-03-07 NOTE — Telephone Encounter (Signed)
Prescription refill request for Eliquis received. Indication: Afib  Last office visit: 02/09/23 Dan Humphreys)  Scr: 0.99 (03/01/23)  Age: 84 Weight: 106.5kg  Appropriate dose. Refill sent.

## 2023-03-07 NOTE — Telephone Encounter (Signed)
Patient states needs to be requalified for oxygen. Patient uses [facility]. Patient phone number is [phone].

## 2023-03-07 NOTE — Telephone Encounter (Signed)
*  STAT* If patient is at the pharmacy, call can be transferred to refill team.   1. Which medications need to be refilled? (please list name of each medication and dose if known) apixaban (ELIQUIS) 5 MG TABS tablet   2. Which pharmacy/location (including street and city if local pharmacy) is medication to be sent to?  [facility] - [place], [place] - [address]      3. Do they need a 30 day or 90 day supply? 90 day

## 2023-03-08 ENCOUNTER — Other Ambulatory Visit: Payer: Self-pay

## 2023-03-08 ENCOUNTER — Other Ambulatory Visit (HOSPITAL_BASED_OUTPATIENT_CLINIC_OR_DEPARTMENT_OTHER): Payer: Self-pay

## 2023-03-08 MED ORDER — APIXABAN 5 MG PO TABS: 5.0000 mg | ORAL_TABLET | Freq: Two times a day (BID) | ORAL | 1 refills | Status: DC

## 2023-03-08 MED ORDER — APIXABAN 5 MG PO TABS: 5.0000 mg | ORAL_TABLET | Freq: Two times a day (BID) | ORAL | 0 refills | Status: DC

## 2023-03-08 NOTE — Telephone Encounter (Signed)
Received faxed refill request from [facility] for Eliquis rx.  Prescription refill request for Eliquis received. Indication: Afib  Last office visit: 02/09/23 Dan Humphreys)  Scr: 0.99 (03/01/23)  Age: 83 Weight: 106.5kg   Appropriate dose. Refill sent

## 2023-03-08 NOTE — Telephone Encounter (Signed)
Called and spoke to patient. Pt made aware of samples and to come to [facility] office for pick up. She verbalized understanding.

## 2023-03-08 NOTE — Telephone Encounter (Signed)
Pt called back and said she doesn't drive but [facility] was going to stop by our office and pick them up for her so they can bring them to her

## 2023-03-09 NOTE — Telephone Encounter (Signed)
Pt would need an appointment. See if pt can be seen sooner than April and schedule appointment.

## 2023-03-11 ENCOUNTER — Encounter: Payer: Self-pay | Admitting: Pulmonary Disease

## 2023-03-14 DIAGNOSIS — J449 Chronic obstructive pulmonary disease, unspecified: Secondary | ICD-10-CM | POA: Diagnosis not present

## 2023-03-15 ENCOUNTER — Ambulatory Visit: Payer: Self-pay

## 2023-03-15 NOTE — Patient Outreach (Signed)
Care Coordination   Follow Up Visit Note   03/15/2023 Name: Coutney Wildermuth MRN: [id number] DOB: May 09, 1940  Nikya Busler is a 83 y.o. year old female who sees Early, Sung Amabile, NP for primary care. I spoke with  [name] by phone today.  What matters to the patients health and wellness today?  Patient would like to f/u with a Cardiology MD at her next appointment.     Goals Addressed             This Visit's Progress    RN Care Coordination Activities - further follow up needed   On track    Care Coordination Interventions: Evaluation of current treatment plan related to CHF and COPD and patient's adherence to plan as established by provider Discussed with patient she completed an in person visit with Dr. [name], Pulmonologist for evaluation of increased shortness of breath and lower extremity edema  Review of patient status, including review of consultant's reports, relevant laboratory and other test results, and medication reconciliation completed Placed outbound call to [facility] to confirm patient is taking Atorvastatin as directed Chronic Obstructive Pulmonary Disease with exacerbation Patient is on Trelegy inhaler and nebulizer treatments, but reports no significant improvement in symptoms. -Continue current inhaler and nebulizer treatments. -start extended prednisone taper - start doxycycline 1 tab twice daily Congestive Heart Failure Patient reports increased shortness of breath and leg swelling, currently managed with 40mg  of Torsemide daily. No significant weight changes noted. -Increase Torsemide to three tablets in the morning and two at night for the next two days to manage fluid retention and assess response. -Referral to a cardiologist for further management. -Attempt to obtain labs to check kidney function in relation to increased diuretic use, if feasible. Follow up in 2 months Reviewed and discussed patient's next upcoming scheduled PCP follow up set for 03/22/23;  next Pulmonology follow up with Dr. [name] set for 05/01/23; next scheduled Cardiology follow up with Gillian Shields NP set for 05/08/23; patient will call to reschedule her Dexa Scan set for 03/21/23 due to only having transportation on Tues/Thurs through her retirement community  Discussed plans with patient for ongoing care coordination follow up and provided patient with direct contact information for nurse care coordinator with next planned telephone visit set for 03/27/23 @2 :00 PM Patient will start using her DUONEB exactly as directed to help improve shortness of breath  Patient will keep all scheduled follow up appointments as directed  Patient will call [facility] to reschedule her Dexa Scan  Patient will notify her doctor promptly of new or worsening symptoms  Patient will continue to work with nurse case manager for ongoing support of chronic conditions and care coordination      Interventions Today    Flowsheet Row Most Recent Value  Chronic Disease   Chronic disease during today's visit Congestive Heart Failure (CHF), Chronic Obstructive Pulmonary Disease (COPD), Atrial Fibrillation (AFib)  General Interventions   General Interventions Discussed/Reviewed General Interventions Discussed, General Interventions Reviewed, Labs, Doctor Visits, Communication with, Durable Medical Equipment (DME)  Doctor Visits Discussed/Reviewed Doctor Visits Discussed, Doctor Visits Reviewed, Specialist, PCP  Durable Medical Equipment (DME) Oxygen  Communication with Pharmacists  [[facility]]  Education Interventions   Education Provided Provided Education  Provided Verbal Education On When to see the doctor, Medication  Pharmacy Interventions   Pharmacy Dicussed/Reviewed Pharmacy Topics Discussed, Pharmacy Topics Reviewed, Medications and their functions, Medication Adherence          SDOH assessments and interventions completed:  No     Care Coordination Interventions:  Yes,  provided   Follow up plan: Follow up call scheduled for 03/27/23 @2 :00 PM    Encounter Outcome:  Patient Visit Completed

## 2023-03-15 NOTE — Patient Instructions (Signed)
Visit Information  Thank you for taking time to visit with me today. Please don't hesitate to contact me if I can be of assistance to you.   Following are the goals we discussed today:   Goals Addressed             This Visit's Progress    RN Care Coordination Activities - further follow up needed   On track    Care Coordination Interventions: Evaluation of current treatment plan related to CHF and COPD and patient's adherence to plan as established by provider Discussed with patient she completed an in person visit with Dr. [name], Pulmonologist for evaluation of increased shortness of breath and lower extremity edema  Review of patient status, including review of consultant's reports, relevant laboratory and other test results, and medication reconciliation completed Placed outbound call to [facility] to confirm patient is taking Atorvastatin as directed Chronic Obstructive Pulmonary Disease with exacerbation Patient is on Trelegy inhaler and nebulizer treatments, but reports no significant improvement in symptoms. -Continue current inhaler and nebulizer treatments. -start extended prednisone taper - start doxycycline 1 tab twice daily Congestive Heart Failure Patient reports increased shortness of breath and leg swelling, currently managed with 40mg  of Torsemide daily. No significant weight changes noted. -Increase Torsemide to three tablets in the morning and two at night for the next two days to manage fluid retention and assess response. -Referral to a cardiologist for further management. -Attempt to obtain labs to check kidney function in relation to increased diuretic use, if feasible. Follow up in 2 months Reviewed and discussed patient's next upcoming scheduled PCP follow up set for 03/22/23; next Pulmonology follow up with Dr. [name] set for 05/01/23; next scheduled Cardiology follow up with Gillian Shields NP set for 05/08/23; patient will call to reschedule her Dexa Scan  set for 03/21/23 due to only having transportation on Tues/Thurs through her retirement community  Discussed plans with patient for ongoing care coordination follow up and provided patient with direct contact information for nurse care coordinator with next planned telephone visit set for 03/27/23 @2 :00 PM Patient will start using her DUONEB exactly as directed to help improve shortness of breath  Patient will keep all scheduled follow up appointments as directed  Patient will call [facility] to reschedule her Dexa Scan  Patient will notify her doctor promptly of new or worsening symptoms  Patient will continue to work with nurse case manager for ongoing support of chronic conditions and care coordination          Our next appointment is by telephone on 03/27/23 at 2:00 PM  Please call the care guide team at [phone] if you need to cancel or reschedule your appointment.   If you are experiencing a Mental Health or Behavioral Health Crisis or need someone to talk to, please call 1-800-273-TALK (toll free, 24 hour hotline)  Patient verbalizes understanding of instructions and care plan provided today and agrees to view in MyChart. Active MyChart status and patient understanding of how to access instructions and care plan via MyChart confirmed with patient.     [name] RN BSN CCM [place]  [facility], [facility] Nurse Care Coordinator  Direct Dial: [phone] Website: [email or web address]

## 2023-03-16 ENCOUNTER — Encounter: Payer: Self-pay | Admitting: Nurse Practitioner

## 2023-03-16 ENCOUNTER — Other Ambulatory Visit: Payer: Self-pay | Admitting: Nurse Practitioner

## 2023-03-16 DIAGNOSIS — Z13820 Encounter for screening for osteoporosis: Secondary | ICD-10-CM

## 2023-03-21 ENCOUNTER — Other Ambulatory Visit: Payer: Medicare HMO

## 2023-03-22 ENCOUNTER — Emergency Department (HOSPITAL_COMMUNITY): Payer: Medicare Other

## 2023-03-22 ENCOUNTER — Emergency Department (HOSPITAL_COMMUNITY)
Admission: EM | Admit: 2023-03-22 | Discharge: 2023-03-23 | Disposition: A | Discharge status: Home / Self Care | Payer: Medicare Other | Attending: Student | Admitting: Student

## 2023-03-22 ENCOUNTER — Ambulatory Visit: Payer: Medicare Other | Admitting: Nurse Practitioner

## 2023-03-22 ENCOUNTER — Other Ambulatory Visit: Payer: Self-pay

## 2023-03-22 ENCOUNTER — Encounter (HOSPITAL_COMMUNITY): Payer: Self-pay

## 2023-03-22 VITALS — BP 124/80 | HR 67

## 2023-03-22 DIAGNOSIS — R0689 Other abnormalities of breathing: Secondary | ICD-10-CM | POA: Diagnosis not present

## 2023-03-22 DIAGNOSIS — D518 Other vitamin B12 deficiency anemias: Secondary | ICD-10-CM

## 2023-03-22 DIAGNOSIS — I5032 Chronic diastolic (congestive) heart failure: Secondary | ICD-10-CM

## 2023-03-22 DIAGNOSIS — K220 Achalasia of cardia: Secondary | ICD-10-CM

## 2023-03-22 DIAGNOSIS — Z79899 Other long term (current) drug therapy: Secondary | ICD-10-CM | POA: Insufficient documentation

## 2023-03-22 DIAGNOSIS — E559 Vitamin D deficiency, unspecified: Secondary | ICD-10-CM

## 2023-03-22 DIAGNOSIS — I110 Hypertensive heart disease with heart failure: Secondary | ICD-10-CM | POA: Insufficient documentation

## 2023-03-22 DIAGNOSIS — R0602 Shortness of breath: Secondary | ICD-10-CM | POA: Diagnosis not present

## 2023-03-22 DIAGNOSIS — J90 Pleural effusion, not elsewhere classified: Secondary | ICD-10-CM | POA: Diagnosis not present

## 2023-03-22 DIAGNOSIS — J441 Chronic obstructive pulmonary disease with (acute) exacerbation: Secondary | ICD-10-CM | POA: Insufficient documentation

## 2023-03-22 DIAGNOSIS — Z87891 Personal history of nicotine dependence: Secondary | ICD-10-CM | POA: Insufficient documentation

## 2023-03-22 DIAGNOSIS — R918 Other nonspecific abnormal finding of lung field: Secondary | ICD-10-CM | POA: Diagnosis not present

## 2023-03-22 DIAGNOSIS — R0600 Dyspnea, unspecified: Secondary | ICD-10-CM | POA: Diagnosis not present

## 2023-03-22 DIAGNOSIS — R531 Weakness: Secondary | ICD-10-CM | POA: Diagnosis not present

## 2023-03-22 DIAGNOSIS — R296 Repeated falls: Secondary | ICD-10-CM | POA: Insufficient documentation

## 2023-03-22 DIAGNOSIS — Z7901 Long term (current) use of anticoagulants: Secondary | ICD-10-CM | POA: Insufficient documentation

## 2023-03-22 DIAGNOSIS — J9611 Chronic respiratory failure with hypoxia: Secondary | ICD-10-CM

## 2023-03-22 DIAGNOSIS — K224 Dyskinesia of esophagus: Secondary | ICD-10-CM | POA: Diagnosis not present

## 2023-03-22 DIAGNOSIS — I4891 Unspecified atrial fibrillation: Secondary | ICD-10-CM | POA: Diagnosis not present

## 2023-03-22 DIAGNOSIS — R600 Localized edema: Secondary | ICD-10-CM

## 2023-03-22 DIAGNOSIS — J45909 Unspecified asthma, uncomplicated: Secondary | ICD-10-CM | POA: Diagnosis not present

## 2023-03-22 DIAGNOSIS — R0989 Other specified symptoms and signs involving the circulatory and respiratory systems: Secondary | ICD-10-CM | POA: Diagnosis not present

## 2023-03-22 DIAGNOSIS — J42 Unspecified chronic bronchitis: Secondary | ICD-10-CM

## 2023-03-22 DIAGNOSIS — Z9981 Dependence on supplemental oxygen: Secondary | ICD-10-CM

## 2023-03-22 DIAGNOSIS — E1169 Type 2 diabetes mellitus with other specified complication: Secondary | ICD-10-CM | POA: Diagnosis not present

## 2023-03-22 DIAGNOSIS — I152 Hypertension secondary to endocrine disorders: Secondary | ICD-10-CM

## 2023-03-22 DIAGNOSIS — G5791 Unspecified mononeuropathy of right lower limb: Secondary | ICD-10-CM

## 2023-03-22 DIAGNOSIS — R09A2 Foreign body sensation, throat: Secondary | ICD-10-CM

## 2023-03-22 DIAGNOSIS — E1159 Type 2 diabetes mellitus with other circulatory complications: Secondary | ICD-10-CM | POA: Diagnosis not present

## 2023-03-22 DIAGNOSIS — I4819 Other persistent atrial fibrillation: Secondary | ICD-10-CM

## 2023-03-22 DIAGNOSIS — R0902 Hypoxemia: Secondary | ICD-10-CM | POA: Diagnosis not present

## 2023-03-22 DIAGNOSIS — I509 Heart failure, unspecified: Secondary | ICD-10-CM | POA: Diagnosis not present

## 2023-03-22 DIAGNOSIS — H9193 Unspecified hearing loss, bilateral: Secondary | ICD-10-CM

## 2023-03-22 LAB — TROPONIN I (HIGH SENSITIVITY)
Troponin I (High Sensitivity): 10 ng/L (ref <18)
Troponin I (High Sensitivity): 9 ng/L (ref <18)

## 2023-03-22 LAB — CBC
HCT: 36.9 % (ref 36.0–46.0)
Hemoglobin: 12 g/dL (ref 12.0–15.0)
MCH: 28.5 pg (ref 26.0–34.0)
MCHC: 32.5 g/dL (ref 30.0–36.0)
MCV: 87.6 fL (ref 80.0–100.0)
Platelets: 255 10*3/uL (ref 150–400)
RBC: 4.21 MIL/uL (ref 3.87–5.11)
RDW: 16.4 % — ABNORMAL HIGH (ref 11.5–15.5)
WBC: 9.2 10*3/uL (ref 4.0–10.5)
nRBC: 0 % (ref 0.0–0.2)

## 2023-03-22 LAB — BASIC METABOLIC PANEL
Anion gap: 13 (ref 5–15)
BUN: 19 mg/dL (ref 8–23)
CO2: 31 mmol/L (ref 22–32)
Calcium: 9 mg/dL (ref 8.9–10.3)
Chloride: 94 mmol/L — ABNORMAL LOW (ref 98–111)
Creatinine, Ser: 1.03 mg/dL — ABNORMAL HIGH (ref 0.44–1.00)
GFR, Estimated: 54 mL/min — ABNORMAL LOW (ref >60)
Glucose, Bld: 123 mg/dL — ABNORMAL HIGH (ref 70–99)
Potassium: 2.9 mmol/L — ABNORMAL LOW (ref 3.5–5.1)
Sodium: 138 mmol/L (ref 135–145)

## 2023-03-22 LAB — BRAIN NATRIURETIC PEPTIDE: B Natriuretic Peptide: 160 pg/mL — ABNORMAL HIGH (ref 0.0–100.0)

## 2023-03-22 LAB — I-STAT CG4 LACTIC ACID, ED: Lactic Acid, Venous: 1.5 mmol/L (ref 0.5–1.9)

## 2023-03-22 MED ORDER — DOXYCYCLINE HYCLATE 100 MG PO TABS
100.0000 mg | ORAL_TABLET | Freq: Two times a day (BID) | ORAL | Status: DC
  Administered 2023-03-22: 100 mg via ORAL
  Filled 2023-03-22: qty 1

## 2023-03-22 MED ORDER — POTASSIUM CHLORIDE CRYS ER 20 MEQ PO TBCR
40.0000 meq | EXTENDED_RELEASE_TABLET | Freq: Once | ORAL | Status: AC
  Administered 2023-03-22: 40 meq via ORAL
  Filled 2023-03-22: qty 2

## 2023-03-22 MED ORDER — FUROSEMIDE 10 MG/ML IJ SOLN
40.0000 mg | Freq: Once | INTRAMUSCULAR | Status: AC
  Administered 2023-03-22: 40 mg via INTRAVENOUS
  Filled 2023-03-22: qty 4

## 2023-03-22 MED ORDER — GUAIFENESIN ER 600 MG PO TB12
600.0000 mg | ORAL_TABLET | Freq: Two times a day (BID) | ORAL | Status: DC
Start: 2023-03-22 — End: 2023-03-23
  Administered 2023-03-22: 600 mg via ORAL
  Filled 2023-03-22: qty 1

## 2023-03-22 MED ORDER — LORAZEPAM 1 MG PO TABS
1.0000 mg | ORAL_TABLET | Freq: Two times a day (BID) | ORAL | Status: DC | PRN
  Administered 2023-03-22: 1 mg via ORAL
  Filled 2023-03-22: qty 1

## 2023-03-22 MED ORDER — MAGNESIUM OXIDE -MG SUPPLEMENT 400 (240 MG) MG PO TABS
800.0000 mg | ORAL_TABLET | Freq: Once | ORAL | Status: AC
Start: 2023-03-22 — End: 2023-03-22
  Administered 2023-03-22: 800 mg via ORAL
  Filled 2023-03-22: qty 2

## 2023-03-22 MED ORDER — PREDNISONE 10 MG PO TABS: ORAL_TABLET | ORAL | 0 refills | Status: AC

## 2023-03-22 MED ORDER — DOXYCYCLINE HYCLATE 100 MG PO TABS: 100.0000 mg | ORAL_TABLET | Freq: Two times a day (BID) | ORAL | 0 refills | Status: DC

## 2023-03-22 MED ORDER — AZITHROMYCIN 250 MG PO TABS: ORAL_TABLET | ORAL | 0 refills | Status: AC

## 2023-03-22 MED ORDER — APIXABAN 5 MG PO TABS
5.0000 mg | ORAL_TABLET | Freq: Two times a day (BID) | ORAL | Status: DC
  Administered 2023-03-22: 5 mg via ORAL
  Filled 2023-03-22: qty 1

## 2023-03-22 MED ORDER — METOPROLOL TARTRATE 25 MG PO TABS
25.0000 mg | ORAL_TABLET | Freq: Two times a day (BID) | ORAL | Status: DC
  Administered 2023-03-22: 25 mg via ORAL
  Filled 2023-03-22: qty 1

## 2023-03-22 NOTE — Patient Instructions (Addendum)
 I will request speech therapy at [facility] to see if we can get help with the swallowing difficulties you have been having.  I am also going to send a referral to a gastrointestinal doctor to see if they have any recommendations on how to best manage the lack of movement in the esophagus.    I will request physical therapy to help with strength training and gait training at [facility]. This can help you get around better and not feel so weak in the legs.    I am treating you for exacerbation of your COPD and suspected pneumonia. I have sent in azithromycin and doxycycline as well as steroids for treatment.    I will see if we can get you in with Dr. Rennis Golden to meet him.

## 2023-03-22 NOTE — ED Triage Notes (Signed)
 Pt bib ems from [facility] assisted living; c/o sob and gen weakness x 2 weeks; recently prescribed steroids and abx; decreased lower right; no fever, c/o sob, 91% RA; 98% 8L duoneb, 125 mg solumedrol given pta, improvement in lung sounds and work of breathing; 12 lead unremarkable; 126/74, HR 97, afib hx same; rr 29, 99% 8L, capnog 46, 98.52F; pt states she wear 2L at baseline; chf hx, endorses swelling in bilateral feet

## 2023-03-22 NOTE — ED Provider Notes (Signed)
 [place] EMERGENCY DEPARTMENT AT [facility] Provider Note  CSN: [id number] Arrival date & time: 03/22/23 1611  Chief Complaint(s) No chief complaint on file.  HPI Lindsay [name] is a 83 y.o. female with PMH CHF, permanent A-fib on Eliquis, HTN, chronic respiratory failure on 2 L home O2 who presents emergency room for evaluation of shortness of breath.  Patient arrives from [place] who had concern about the patient's generalized weakness and shortness of breath of the last 2 weeks.  Patient was seen by her primary care practitioner this morning who started her on doxycycline and steroids for suspected pneumonia/bronchitis.  No x-ray was performed at that time.  Patient received 1 DuoNeb and 125 Solu-Medrol prior to arrival.  She states that her respiratory symptoms have significantly improved since receiving these treatments and states that she feels completely back to baseline.  Currently denying shortness of breath, chest pain, abdominal pain, nausea, vomiting or other systemic symptoms.   Past Medical History Past Medical History:  Diagnosis Date   Acute diastolic CHF (congestive heart failure) (HCC) 08/03/2020   Acute non-recurrent pansinusitis 10/21/2020   Acute on chronic respiratory failure with hypoxia (HCC) 08/02/2020   Asthma    Asthma exacerbation 08/02/2020   Atrial fibrillation (HCC)    Dysphagia 08/02/2020   Dyspnea 05/09/2021   Elevated troponin 09/14/2020   High cholesterol    HTN (hypertension)    Hyperkalemia 09/14/2020   Hyponatremia 08/02/2020   Low grade squamous intraepithelial lesion on cytologic smear of cervix (LGSIL) 12/02/2019   Moderate persistent asthma without complication 12/02/2019   Non-healing wound of right lower extremity 08/01/2022   Respiratory failure with hypoxia (HCC)    Sore throat 11/16/2020   Patient Active Problem List   Diagnosis Date Noted   Weakness 03/22/2023   COPD with acute exacerbation (HCC) 03/22/2023    Falls 03/22/2023   Papule of skin 11/29/2022   Sinus congestion 11/29/2022   Hospital discharge follow-up 11/28/2022   Acute on chronic respiratory failure with hypoxia and hypercapnia (HCC) 11/10/2022   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   Abdominal aortic ectasia (HCC) 09/18/2022   Atherosclerosis of native arteries of extremities with rest pain, bilateral legs (HCC) 09/18/2022   Hypothyroidism 09/18/2022   Insomnia 09/18/2022   Non-toxic multinodular goiter 09/18/2022   Occlusion and stenosis of bilateral carotid arteries 09/18/2022   Other vitamin B12 deficiency anemias 09/18/2022   Unspecified mononeuropathy of right lower limb 09/18/2022   Presbycusis of both ears 07/06/2022   Sebaceous cyst 05/26/2022   Bilateral hearing loss 05/11/2022   Hypertension associated with diabetes (HCC) 05/11/2021   GERD (gastroesophageal reflux disease) 05/09/2021   Hyperlipidemia associated with type 2 diabetes mellitus (HCC) 05/09/2021   Elevated fasting blood sugar 05/09/2021   Bilateral lower extremity edema 03/09/2021   Physical deconditioning 02/18/2021   Generalized anxiety disorder 10/21/2020   Urinary incontinence in female 09/28/2020   Chronic CHF (congestive heart failure) (HCC) 09/14/2020   Achalasia of esophagus 08/06/2020   Acute on chronic diastolic CHF (congestive heart failure) (HCC) 08/03/2020   Chronic respiratory failure with hypoxia (HCC) 08/02/2020   Hypokalemia 08/02/2020   Chronic anticoagulation 08/02/2020   Acquired thrombophilia (HCC) 08/02/2020   Persistent atrial fibrillation (HCC) 08/02/2020   Age-related osteoporosis without current pathological fracture 01/15/2020   History of compression fracture of spine 12/02/2019   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 Authorizing Provider  alendronate (FOSAMAX) 70 MG tablet Take 1 tablet (70 mg total) by mouth once a week. Take with a full glass of water on an empty stomach. 08/21/22  Yes Early, Sung Amabile, NP  apixaban (ELIQUIS) 5 MG TABS tablet Take 1 tablet (5 mg total) by mouth 2 (two) times daily. 03/08/23  Yes Hilty, Lisette Abu, MD  atorvastatin (LIPITOR) 40 MG tablet Take 1 tablet (40 mg total) by mouth daily. 08/21/22 03/22/23 Yes Alver Sorrow, NP  Calcium-Magnesium-Vitamin D (CALCIUM 1200+D3 PO) Take 1 tablet by mouth in the morning.   Yes [provider]  cetirizine (ZYRTEC) 10 MG tablet TAKE 1 TABLET BY MOUTH EVERY DAY 02/12/23  Yes Martina Sinner, MD  Cyanocobalamin (B-12) 1000 MCG SUBL Place 1 tablet under the tongue daily at 6 (six) AM. 11/28/22  Yes Early, Sung Amabile, NP  fluticasone (FLONASE) 50 MCG/ACT nasal spray Place 1 spray into both nostrils 2 (two) times daily as needed for allergies or rhinitis.   Yes [provider]  Fluticasone-Umeclidin-Vilant (TRELEGY ELLIPTA) 100-62.5-25 MCG/ACT AEPB Inhale 1 puff into the lungs daily. 10/18/22  Yes Martina Sinner, MD  guaiFENesin (MUCINEX) 600 MG 12 hr tablet TAKE 1 TABLET BY MOUTH 2 TIMES DAILY 03/22/22  Yes Cobb, Ruby Cola, NP  ipratropium-albuterol (DUONEB) 0.5-2.5 (3) MG/3ML SOLN Inhale 3 mLs into the lungs every 6 (six) hours as needed (wheezing). 02/08/23  Yes Martina Sinner, MD  LORazepam (ATIVAN) 1 MG tablet TAKE 1 TABLET BY MOUTH 2 TIMES DAILY as needed for anxiety 01/15/23  Yes Early, Sung Amabile, NP  Menthol (COUGH DROPS) 5.8 MG LOZG Use as directed 1 lozenge (5.8 mg total) in the mouth or throat as needed (for dry, sore, or scratchy  throat). 11/16/20  Yes Early, Sung Amabile, NP  metoprolol tartrate (LOPRESSOR) 25 MG tablet TAKE 1 TABLET BY MOUTH 2 TIMES DAILY 03/05/23  Yes Early, Sung Amabile, NP  mirtazapine (REMERON) 45 MG tablet TAKE 1 TABLET BY MOUTH AT BEDTIME Patient taking differently: Take 45 mg by mouth at bedtime as needed (sleep). 01/11/23  Yes Early, Sung Amabile, NP  montelukast (SINGULAIR) 10 MG tablet Take 10 mg by mouth at bedtime.   Yes [provider]  Multiple Vitamin (MULTIVITAMIN WITH MINERALS) TABS tablet Take 1 tablet by mouth daily with breakfast.   Yes [provider]  pantoprazole (PROTONIX) 20 MG tablet TAKE 1 TABLET BY MOUTH EVERY DAY 01/11/23  Yes Early, Sung Amabile, NP  potassium chloride (KLOR-CON M) 20 MEQ tablet Take 1 tablet (20 mEq total) by mouth daily. 11/28/22  Yes Early, Sung Amabile, NP  sertraline (ZOLOFT) 100 MG tablet TAKE 1 TABLET BY MOUTH AT BEDTIME 01/15/23  Yes Early, Sung Amabile, NP  torsemide (DEMADEX) 20 MG tablet TAKE 2 TABLETS DAILY EVERY MORNING. TAKE AN ADDITIONAL 2 TABLETS AS NEEDED FOR WEIGHT GAIN OF 2 POUNDS OVERNIGHT OR 5 POUNDS IN ONE WEEK. 02/09/23  Yes Alver Sorrow, NP  azithromycin (ZITHROMAX) 250 MG tablet Take 2 tablets on day 1, then 1 tablet daily on days 2 through 5 Patient not taking: Reported on 03/22/2023 03/22/23 03/27/23  Enid Skeens  E, NP  Blood Glucose Monitoring Suppl DEVI 1 each by Does not apply route in the morning, at noon, and at bedtime. May substitute to any manufacturer covered by patient's insurance. 11/29/22   Tollie Eth, NP  doxycycline (VIBRA-TABS) 100 MG tablet Take 1 tablet (100 mg total) by mouth 2 (two) times daily. Patient not taking: Reported on 03/22/2023 03/22/23   Early, Sung Amabile, NP  Elastic Bandages & Supports (MEDICAL COMPRESSION STOCKINGS) MISC Two pair knee high 30-27mmHg compression stockings. Sized XL. As covered by insurance for ICD-10: I50.33, R60.0. 03/09/21   Early, Sung Amabile, NP  Glucose Blood (BLOOD GLUCOSE TEST STRIPS) STRP For monitoring  blood sugar levels up to 4 times a day. May substitute to any manufacturer covered by patient's insurance. 11/29/22   Tollie Eth, NP  Lancet Device MISC For monitoring blood sugar up to 4 times a day. May substitute to any manufacturer covered by patient's insurance. 11/29/22   Tollie Eth, NP  Lancets Misc. MISC For monitoring blood sugar up to 4 times a day. May substitute to any manufacturer covered by patient's insurance. 11/29/22   Tollie Eth, NP  OXYGEN Inhale 2 L/min into the lungs continuous.    [provider]  predniSONE (DELTASONE) 10 MG tablet Take 4 tablets (40 mg total) by mouth daily with breakfast for 3 days, THEN 3 tablets (30 mg total) daily with breakfast for 3 days, THEN 2 tablets (20 mg total) daily with breakfast for 3 days, THEN 1 tablet (10 mg total) daily with breakfast for 3 days. Patient not taking: Reported on 03/22/2023 03/22/23 04/03/23  Tollie Eth, NP  Spacer/Aero-Holding Deretha Emory (AEROCHAMBER MV) inhaler Use as instructed 09/29/22   Martina Sinner, MD                                                                                                                                    Past Surgical History Past Surgical History:  Procedure Laterality Date   CARDIOVERSION N/A 09/13/2020   Procedure: CARDIOVERSION;  Surgeon: Parke Poisson, MD;  Location: [facility] ENDOSCOPY;  Service: Cardiovascular;  Laterality: N/A;   ESOPHAGOGASTRODUODENOSCOPY N/A 08/05/2020   Procedure: ESOPHAGOGASTRODUODENOSCOPY (EGD);  Surgeon: Kathi Der, MD;  Location: [facility];  Service: Gastroenterology;  Laterality: N/A;   Family History Family History  Problem Relation Age of Onset   Breast cancer Sister    Hypertension Other     Social History Social History   Tobacco Use   Smoking status: Former    Types: Cigarettes   Smokeless tobacco: Never   Tobacco comments:    Quit remotely 1970s  Vaping Use   Vaping status: Never Used  Substance Use Topics    Alcohol use: Not Currently   Drug use: Never   Allergies Acetaminophen-codeine, Naproxen-esomeprazole mg, and Codeine  Review of Systems Review of Systems  Respiratory:  Positive for cough and shortness of breath.  Physical Exam Vital Signs  I have reviewed the triage vital signs BP 105/74   Pulse 90   Temp 98.1 F (36.7 C) (Oral)   Resp (!) 25   Ht 5\' 8"  (1.727 m)   Wt 102.1 kg   SpO2 96%   BMI 34.21 kg/m   Physical Exam Vitals and nursing note reviewed.  Constitutional:      General: She is not in acute distress.    Appearance: She is well-developed.  HENT:     Head: Normocephalic and atraumatic.  Eyes:     Conjunctiva/sclera: Conjunctivae normal.  Cardiovascular:     Rate and Rhythm: Normal rate and regular rhythm.     Heart sounds: No murmur heard. Pulmonary:     Effort: Pulmonary effort is normal. No respiratory distress.     Breath sounds: Wheezing present.  Abdominal:     Palpations: Abdomen is soft.     Tenderness: There is no abdominal tenderness.  Musculoskeletal:        General: No swelling.     Cervical back: Neck supple.  Skin:    General: Skin is warm and dry.     Capillary Refill: Capillary refill takes less than 2 seconds.  Neurological:     Mental Status: She is alert.  Psychiatric:        Mood and Affect: Mood normal.     ED Results and Treatments Labs (all labs ordered are listed, but only abnormal results are displayed) Labs Reviewed  BASIC METABOLIC PANEL - Abnormal; Notable for the following components:      Result Value   Potassium 2.9 (*)    Chloride 94 (*)    Glucose, Bld 123 (*)    Creatinine, Ser 1.03 (*)    GFR, Estimated 54 (*)    All other components within normal limits  CBC - Abnormal; Notable for the following components:   RDW 16.4 (*)    All other components within normal limits  BRAIN NATRIURETIC PEPTIDE - Abnormal; Notable for the following components:   B Natriuretic Peptide 160.0 (*)    All other  components within normal limits  I-STAT CG4 LACTIC ACID, ED  I-STAT CG4 LACTIC ACID, ED  TROPONIN I (HIGH SENSITIVITY)  TROPONIN I (HIGH SENSITIVITY)                                                                                                                          Radiology DG Chest 2 View Result Date: 03/22/2023 CLINICAL DATA:  Two-week history of shortness of breath and generalized weakness EXAM: CHEST - 2 VIEW COMPARISON:  Chest radiograph dated 11/15/2022, CTA chest dated 11/11/2022 FINDINGS: Low lung volumes with bronchovascular crowding. Asymmetric right interstitial opacities. Blunting of the bilateral costophrenic angles, left-greater-than-right. No definite pneumothorax. Heart borders are obscured. No acute osseous abnormality. Similar multilevel thoracic compression deformities. IMPRESSION: 1. Low lung volumes with bronchovascular crowding. Asymmetric right interstitial opacities may represent asymmetric pulmonary edema or atypical infection. 2. Blunting  of the bilateral costophrenic angles, left-greater-than-right, which may represent small to moderate pleural effusions. Electronically Signed   By: Agustin Cree M.D.   On: 03/22/2023 21:14    Pertinent labs & imaging results that were available during my care of the patient were reviewed by me and considered in my medical decision making (see MDM for details).  Medications Ordered in ED Medications  furosemide (LASIX) injection 40 mg (has no administration in time range)  apixaban (ELIQUIS) tablet 5 mg (has no administration in time range)  doxycycline (VIBRA-TABS) tablet 100 mg (has no administration in time range)  guaiFENesin (MUCINEX) 12 hr tablet 600 mg (has no administration in time range)  LORazepam (ATIVAN) tablet 1 mg (has no administration in time range)  metoprolol tartrate (LOPRESSOR) tablet 25 mg (has no administration in time range)  potassium chloride SA (KLOR-CON M) CR tablet 40 mEq (40 mEq Oral Given 03/22/23 2011)   magnesium oxide (MAG-OX) tablet 800 mg (800 mg Oral Given 03/22/23 2011)                                                                                                                                     Procedures Procedures  (including critical care time)  Medical Decision Making / ED Course   This patient presents to the ED for concern of shortness of breath, this involves an extensive number of treatment options, and is a complaint that carries with it a high risk of complications and morbidity.  The differential diagnosis includes Pe, PTX, Pulmonary Edema, ARDS, COPD/Asthma, ACS, CHF exacerbation, Arrhythmia, Pericardial Effusion/Tamponade, Anemia, Sepsis, Acidosis/Hypercapnia, Anxiety, Viral URI  MDM: Patient seen emergency room for shortness of breath.  Physical exam reveals a comfortable appearing patient with no respiratory distress, no accessory muscle use or tachypnea.  Faint wheezing at the right lung base but is otherwise unremarkable.  Mild pitting edema bilaterally.  Laboratory evaluation with a potassium of 2.9 which was repleted in the emergency department, creatinine 1.03, BNP 160, high-sensitivity troponin is normal, lactic acid normal.  Chest x-ray with asymmetric interstitial opacities as well as blunting of bilateral costophrenic angles left greater than right.  Additional dose of diuretics given the patient the IV Lasix and patient did have appropriate diuresis here in the emergency department.  She has returned to normal respiratory baseline and already has a prescription for a as needed increase of her torsemide for this exact scenario.  She was encouraged to take the antibiotics and steroids as prescribed by her primary care provider this morning but at this time she does not meet inpatient criteria for admission and will be discharged back to her facility.   Additional history obtained:  -External records from outside source obtained and reviewed including: Chart  review including previous notes, labs, imaging, consultation notes   Lab Tests: -I ordered, reviewed, and interpreted labs.   The pertinent results include:   Labs Reviewed  BASIC  METABOLIC PANEL - Abnormal; Notable for the following components:      Result Value   Potassium 2.9 (*)    Chloride 94 (*)    Glucose, Bld 123 (*)    Creatinine, Ser 1.03 (*)    GFR, Estimated 54 (*)    All other components within normal limits  CBC - Abnormal; Notable for the following components:   RDW 16.4 (*)    All other components within normal limits  BRAIN NATRIURETIC PEPTIDE - Abnormal; Notable for the following components:   B Natriuretic Peptide 160.0 (*)    All other components within normal limits  I-STAT CG4 LACTIC ACID, ED  I-STAT CG4 LACTIC ACID, ED  TROPONIN I (HIGH SENSITIVITY)  TROPONIN I (HIGH SENSITIVITY)      EKG   EKG Interpretation Date/Time:  Thursday March 22 2023 16:36:04 EST Ventricular Rate:  101 PR Interval:    QRS Duration:  82 QT Interval:  362 QTC Calculation: 469 R Axis:   -60  Text Interpretation: Atrial fibrillation When compared with ECG of 10-Nov-2022 08:16, PREVIOUS ECG IS PRESENT Confirmed by Girtie Wiersma ([id number]) on 03/22/2023 5:09:31 PM         Imaging Studies ordered: I ordered imaging studies including chest x-ray I independently visualized and interpreted imaging. I agree with the radiologist interpretation   Medicines ordered and prescription drug management: Meds ordered this encounter  Medications   potassium chloride SA (KLOR-CON M) CR tablet 40 mEq   magnesium oxide (MAG-OX) tablet 800 mg   furosemide (LASIX) injection 40 mg   apixaban (ELIQUIS) tablet 5 mg   doxycycline (VIBRA-TABS) tablet 100 mg   guaiFENesin (MUCINEX) 12 hr tablet 600 mg   LORazepam (ATIVAN) tablet 1 mg   metoprolol tartrate (LOPRESSOR) tablet 25 mg    -I have reviewed the patients home medicines and have made adjustments as needed  Critical  interventions none   Cardiac Monitoring: The patient was maintained on a cardiac monitor.  I personally viewed and interpreted the cardiac monitored which showed an underlying rhythm of: A-fib, rate controlled  Social Determinants of Health:  Factors impacting patients care include: Lives in a skilled nursing facility   Reevaluation: After the interventions noted above, I reevaluated the patient and found that they have :improved  Co morbidities that complicate the patient evaluation  Past Medical History:  Diagnosis Date   Acute diastolic CHF (congestive heart failure) (HCC) 08/03/2020   Acute non-recurrent pansinusitis 10/21/2020   Acute on chronic respiratory failure with hypoxia (HCC) 08/02/2020   Asthma    Asthma exacerbation 08/02/2020   Atrial fibrillation (HCC)    Dysphagia 08/02/2020   Dyspnea 05/09/2021   Elevated troponin 09/14/2020   High cholesterol    HTN (hypertension)    Hyperkalemia 09/14/2020   Hyponatremia 08/02/2020   Low grade squamous intraepithelial lesion on cytologic smear of cervix (LGSIL) 12/02/2019   Moderate persistent asthma without complication 12/02/2019   Non-healing wound of right lower extremity 08/01/2022   Respiratory failure with hypoxia (HCC)    Sore throat 11/16/2020      Dispostion: I considered admission for this patient, but at this time she does not meet inpatient criteria for admission and will be discharged with outpatient follow-up     Final Clinical Impression(s) / ED Diagnoses Final diagnoses:  Dyspnea, unspecified type  Pleural effusion     @PCDICTATION @    Glendora Score, MD 03/23/23 1045

## 2023-03-22 NOTE — ED Notes (Signed)
 Patient returned from  CT. Stretcher locked in lowest position. Pt in position of comfort with call bell in reach. No complaints at this time.

## 2023-03-22 NOTE — ED Notes (Signed)
 Patient transported to CT

## 2023-03-22 NOTE — Progress Notes (Signed)
  Shawna Clamp, DNP, AGNP-c [facility]  [address] [place], [place] [address] [phone]  ESTABLISHED PATIENT- Chronic Health and/or Follow-Up Visit  Blood pressure 124/80, pulse 67.    Lindsay [name] is a 83 y.o. year old female presenting today for evaluation and management of chronic conditions.  Lindsay [name] has multiple concerns both acute and chronic today.   History of Present Illness Lindsay [name] is an 83 year old female with atrial fibrillation who presents with respiratory symptoms and generalized weakness.  She has been experiencing coughing and congestion for the past two weeks. Despite visiting her lung doctor, no chest x-ray was performed, and she remains concerned about pneumonia. She feels fatigued and lacks energy, with recent development of phlegm that is difficult to expel. She is currently taking Mucinex twice a day. Occasional chills are noted, but she has not measured her temperature. No worsening of swelling in her feet or ankles, although she did not take her Lasix on the day of the visit to avoid frequent urination while out.  She describes a sensation of weakness in her legs, feeling as though they might give out when walking. She experienced a fall on Sunday while attempting to get up from her recliner, resulting in a bruised arm and knee, but no fractures. She attributes the fall to possibly falling asleep before attempting to stand.  She reports pain between her breast bones and episodes of food getting stuck, leading to choking. This has occurred twice in the past six weeks. A previous swallow study indicated severe esophageal dysmotility with significant retention, but no follow-up treatment was provided.  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  She has a history of atrial fibrillation and wants to consult with a cardiologist.  She relies on a bus service for transportation  to medical appointments, which operates on Tuesdays and Thursdays from 9 AM to 1 PM.  All ROS negative with exception of what is listed above.   PHYSICAL EXAM Physical Exam Vitals and nursing note reviewed.  Constitutional:      Appearance: Normal appearance.  HENT:     Head: Normocephalic.  Eyes:     Pupils: Pupils are equal, round, and reactive to light.  Cardiovascular:     Rate and Rhythm: Normal rate. Rhythm irregular.     Pulses: Normal pulses.     Heart sounds: Normal heart sounds.  Pulmonary:     Effort: Pulmonary effort is normal.     Breath sounds: Wheezing and rhonchi present.  Musculoskeletal:        General: Normal range of motion.     Cervical back: Normal range of motion.  Skin:    General: Skin is warm.  Neurological:     General: No focal deficit present.     Mental Status: She is alert and oriented to person, place, and time.     Sensory: Sensory deficit present.     Comments: Decreased sensation in the legs and feet noted. No wounds present. Pedal pulses intact, but faint.   Psychiatric:        Mood and Affect: Mood normal.      PLAN Problem List Items Addressed This Visit   None   No follow-ups on file.  [name] Ermagene Saidi, DNP, AGNP-c  Time: 44 minutes, >50% spent counseling, care coordination, chart review, and documentation.

## 2023-03-22 NOTE — Discharge Instructions (Signed)
 This patient was seen in the emergency room for evaluation of shortness of breath.  She does have some fluid in her lungs and her torsemide prescription is written to take an additional 2 tablets as needed for weight gain.  Please do give her this additional dose at home.  She was given a dose of IV medication here to assist with diuresis.  At this time she does not meet inpatient criteria for admission at home is safe to return home.  She was prescribed steroids and antibiotics by her primary care physician.  Please have these filled and give the patient these medications.

## 2023-03-23 LAB — LIPID PANEL
Chol/HDL Ratio: 2.8 {ratio} (ref 0.0–4.4)
Cholesterol, Total: 162 mg/dL (ref 100–199)
HDL: 58 mg/dL (ref >39)
LDL Chol Calc (NIH): 88 mg/dL (ref 0–99)
Triglycerides: 87 mg/dL (ref 0–149)
VLDL Cholesterol Cal: 16 mg/dL (ref 5–40)

## 2023-03-23 LAB — CBC WITH DIFFERENTIAL/PLATELET
Basophils Absolute: 0 10*3/uL (ref 0.0–0.2)
Basos: 0 %
EOS (ABSOLUTE): 0.1 10*3/uL (ref 0.0–0.4)
Eos: 2 %
Hematocrit: 36 % (ref 34.0–46.6)
Hemoglobin: 11.6 g/dL (ref 11.1–15.9)
Immature Grans (Abs): 0.1 10*3/uL (ref 0.0–0.1)
Immature Granulocytes: 1 %
Lymphocytes Absolute: 0.6 10*3/uL — ABNORMAL LOW (ref 0.7–3.1)
Lymphs: 7 %
MCH: 28.3 pg (ref 26.6–33.0)
MCHC: 32.2 g/dL (ref 31.5–35.7)
MCV: 88 fL (ref 79–97)
Monocytes Absolute: 0.6 10*3/uL (ref 0.1–0.9)
Monocytes: 7 %
Neutrophils Absolute: 7.4 10*3/uL — ABNORMAL HIGH (ref 1.4–7.0)
Neutrophils: 83 %
Platelets: 275 10*3/uL (ref 150–450)
RBC: 4.1 x10E6/uL (ref 3.77–5.28)
RDW: 15.7 % — ABNORMAL HIGH (ref 11.7–15.4)
WBC: 8.7 10*3/uL (ref 3.4–10.8)

## 2023-03-23 LAB — BRAIN NATRIURETIC PEPTIDE: BNP: 141.7 pg/mL — ABNORMAL HIGH (ref 0.0–100.0)

## 2023-03-23 LAB — COMPREHENSIVE METABOLIC PANEL
ALT: 17 [IU]/L (ref 0–32)
AST: 21 [IU]/L (ref 0–40)
Albumin: 3.8 g/dL (ref 3.7–4.7)
Alkaline Phosphatase: 96 [IU]/L (ref 44–121)
BUN/Creatinine Ratio: 18 (ref 12–28)
BUN: 18 mg/dL (ref 8–27)
Bilirubin Total: 0.5 mg/dL (ref 0.0–1.2)
CO2: 29 mmol/L (ref 20–29)
Calcium: 8.9 mg/dL (ref 8.7–10.3)
Chloride: 96 mmol/L (ref 96–106)
Creatinine, Ser: 1 mg/dL (ref 0.57–1.00)
Globulin, Total: 2.6 g/dL (ref 1.5–4.5)
Glucose: 95 mg/dL (ref 70–99)
Potassium: 3.6 mmol/L (ref 3.5–5.2)
Sodium: 144 mmol/L (ref 134–144)
Total Protein: 6.4 g/dL (ref 6.0–8.5)
eGFR: 56 mL/min/{1.73_m2} — ABNORMAL LOW (ref >59)

## 2023-03-23 LAB — IRON,TIBC AND FERRITIN PANEL
Ferritin: 132 ng/mL (ref 15–150)
Iron Saturation: 10 % — ABNORMAL LOW (ref 15–55)
Iron: 32 ug/dL (ref 27–139)
Total Iron Binding Capacity: 313 ug/dL (ref 250–450)
UIBC: 281 ug/dL (ref 118–369)

## 2023-03-23 LAB — MICROALBUMIN / CREATININE URINE RATIO
Creatinine, Urine: 153.8 mg/dL
Microalb/Creat Ratio: 14 mg/g{creat} (ref 0–29)
Microalbumin, Urine: 22 ug/mL

## 2023-03-23 LAB — HEMOGLOBIN A1C
Est. average glucose Bld gHb Est-mCnc: 123 mg/dL
Hgb A1c MFr Bld: 5.9 % — ABNORMAL HIGH (ref 4.8–5.6)

## 2023-03-23 LAB — VITAMIN D 25 HYDROXY (VIT D DEFICIENCY, FRACTURES): Vit D, 25-Hydroxy: 21 ng/mL — ABNORMAL LOW (ref 30.0–100.0)

## 2023-03-23 LAB — VITAMIN B12: Vitamin B-12: 900 pg/mL (ref 232–1245)

## 2023-03-26 ENCOUNTER — Other Ambulatory Visit (HOSPITAL_BASED_OUTPATIENT_CLINIC_OR_DEPARTMENT_OTHER): Payer: Self-pay | Admitting: Nurse Practitioner

## 2023-03-26 DIAGNOSIS — M810 Age-related osteoporosis without current pathological fracture: Secondary | ICD-10-CM

## 2023-03-27 ENCOUNTER — Ambulatory Visit: Payer: Self-pay

## 2023-03-27 NOTE — Patient Outreach (Signed)
 Care Coordination   03/27/2023 Name: Lindsay [name] MRN: [id number] DOB: 01-25-1940   Care Coordination Outreach Attempts:  An unsuccessful outreach was attempted for an appointment today.  Follow Up Plan:  Additional outreach attempts will be made to offer the patient complex care management information and services.   Encounter Outcome:  No Answer   Care Coordination Interventions:  No, not indicated    [name] RN BSN CCM [place]  [facility] Nurse Care Coordinator  Direct Dial: [phone] Website: [name]

## 2023-04-03 DIAGNOSIS — R09A2 Foreign body sensation, throat: Secondary | ICD-10-CM | POA: Insufficient documentation

## 2023-04-03 MED ORDER — ATORVASTATIN CALCIUM 40 MG PO TABS
40.0000 mg | ORAL_TABLET | Freq: Every day | ORAL | 3 refills | Status: DC
Start: 2023-04-03 — End: 2023-09-04

## 2023-04-03 NOTE — Assessment & Plan Note (Signed)
 RLE limited sensation, worse than L. Recommend proper shoes and foot care to prevent ulceration and wounds. No signs of ischemia.

## 2023-04-03 NOTE — Assessment & Plan Note (Signed)
 Chronic with improvement with diuresis. Recommend continued use of compression stockings during the day and elevation of feet.

## 2023-04-03 NOTE — Assessment & Plan Note (Signed)
 Patient reports improved breathing but now requires continuous oxygen at 2L [place]. No wheezing noted. No distress.  -Continue current respiratory medications including Trelegy and mucinex daily.  Ipratropium Albuterol (DuoNeb) and albuterol nebulizer as needed, and Fluticasone (Flonase) nasal spray as needed. -Continue oxygen therapy as currently prescribed. -Start physical therapy. -If oxygen demands increase or you  begin to feel like your shortness of breath is worse, call me immediately.

## 2023-04-03 NOTE — Assessment & Plan Note (Signed)
 Expresses desire to see a cardiologist. Has not seen assigned cardiologist, Dr. Rennis Golden, and has only seen the PA. Discussed importance of cardiology follow-up for management of atrial fibrillation. - Schedule appointment with Dr. Rennis Golden and ask to be seen by MD, if desired.

## 2023-04-03 NOTE — Assessment & Plan Note (Signed)
See chronic respiratory failure. 

## 2023-04-03 NOTE — Assessment & Plan Note (Signed)
 Reports food getting stuck and choking episodes. 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 Discussed potential benefits of speech therapy and referral to GI specialist for further evaluation and management. - Refer to GI specialist - Order speech therapy for swallowing techniques

## 2023-04-03 NOTE — Assessment & Plan Note (Signed)
Chronic morbid obesity in the setting of hypertension, hyperlipidemia, chronic heart failure, and COPD.  Unfortunately her respiratory and cardiac status limit her ability for physical activity.  Dietary recommendations of low calorie, low carbohydrate, low-fat diet are recommended.  Will continue to monitor closely.

## 2023-04-03 NOTE — Assessment & Plan Note (Signed)
 Presents with coughing, congestion, and weakness for a couple of weeks in the setting of chronic respiratory infection and oxygen dependence. Reports chills and difficulty expectorating sputum. Physical exam reveals significant fluid in the left lung, suggestive of a respiratory infection, possibly walking pneumonia. COVID-19 and flu tests were negative. Decision made to treat empirically due to absence of imaging. Discussed risks of untreated pneumonia, benefits of antibiotics and steroids, and potential need for future imaging if no improvement. - Prescribe azithromycin (Z-Pak) - Prescribe doxycycline - Prescribe steroids - Continue Mucinex

## 2023-04-03 NOTE — Assessment & Plan Note (Signed)
Chronic. Stable. No changes  

## 2023-04-03 NOTE — Assessment & Plan Note (Signed)
 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 Discussed referral to ENT specialist for evaluation and potential hearing aid fitting. - Refer to ENT specialist for hearing aids

## 2023-04-03 NOTE — Assessment & Plan Note (Signed)
 Repeat labs:

## 2023-04-03 NOTE — Assessment & Plan Note (Signed)
 Chronic. Labs pending. Refill of atorvastatin.

## 2023-04-04 ENCOUNTER — Encounter: Payer: Self-pay | Admitting: Nurse Practitioner

## 2023-04-04 ENCOUNTER — Other Ambulatory Visit: Payer: Self-pay | Admitting: Nurse Practitioner

## 2023-04-04 ENCOUNTER — Other Ambulatory Visit (HOSPITAL_BASED_OUTPATIENT_CLINIC_OR_DEPARTMENT_OTHER): Payer: Self-pay | Admitting: Nurse Practitioner

## 2023-04-04 DIAGNOSIS — F3341 Major depressive disorder, recurrent, in partial remission: Secondary | ICD-10-CM

## 2023-04-04 DIAGNOSIS — F411 Generalized anxiety disorder: Secondary | ICD-10-CM

## 2023-04-04 DIAGNOSIS — E559 Vitamin D deficiency, unspecified: Secondary | ICD-10-CM

## 2023-04-04 MED ORDER — VITAMIN D (ERGOCALCIFEROL) 1.25 MG (50000 UNIT) PO CAPS
50000.0000 [IU] | ORAL_CAPSULE | ORAL | 3 refills | Status: DC
Start: 2023-04-04 — End: 2023-10-24

## 2023-04-05 NOTE — Telephone Encounter (Signed)
 Last appt. 03/22/23.

## 2023-04-11 DIAGNOSIS — J449 Chronic obstructive pulmonary disease, unspecified: Secondary | ICD-10-CM | POA: Diagnosis not present

## 2023-04-16 ENCOUNTER — Telehealth: Admitting: Medical

## 2023-04-16 ENCOUNTER — Ambulatory Visit: Payer: Self-pay

## 2023-04-16 VITALS — HR 80

## 2023-04-16 DIAGNOSIS — E039 Hypothyroidism, unspecified: Secondary | ICD-10-CM | POA: Diagnosis not present

## 2023-04-16 DIAGNOSIS — R609 Edema, unspecified: Secondary | ICD-10-CM | POA: Diagnosis not present

## 2023-04-16 DIAGNOSIS — Z9981 Dependence on supplemental oxygen: Secondary | ICD-10-CM

## 2023-04-16 DIAGNOSIS — R0602 Shortness of breath: Secondary | ICD-10-CM | POA: Diagnosis not present

## 2023-04-16 DIAGNOSIS — I5032 Chronic diastolic (congestive) heart failure: Secondary | ICD-10-CM | POA: Diagnosis not present

## 2023-04-16 DIAGNOSIS — J441 Chronic obstructive pulmonary disease with (acute) exacerbation: Secondary | ICD-10-CM

## 2023-04-16 DIAGNOSIS — Z7901 Long term (current) use of anticoagulants: Secondary | ICD-10-CM

## 2023-04-16 NOTE — Progress Notes (Unsigned)
 Subjective:     Patient ID: Lindsay [name], female   DOB: November 18, 1940, 83 y.o.   MRN: [id number]  This visit type was conducted due to national recommendations for restrictions regarding the COVID-19 Pandemic (e.g. social distancing) in an effort to limit this patient's exposure and mitigate transmission in our community.  Due to their co-morbid illnesses, this patient is at least at moderate risk for complications without adequate follow up.  This format is felt to be most appropriate for this patient at this time.    Documentation for virtual audio and video telecommunications through [facility] encounter:  The patient was located at home. The provider was located in the office. The patient did consent to this visit and is aware of possible charges through their insurance for this visit.  The other persons participating in this telemedicine service were none. Time spent on call was 20 minutes and in review of previous records 20 minutes total.  This virtual service is not related to other E/M service within previous 7 days.   HPI Chief Complaint  Patient presents with   Foot Swelling    Swelling in both feet since last week for about 7 days. Taking lasix- 2 in am and 2 at night, congestion in chest, SOB,- started 3 days ago,    Virtual visit for concerns about swelling and shortness of breath  She lives in assisted living at [facility]  For the past week or so she has had a little shortness of breath, congestion in the chest, worse swelling in the legs.  She has medical history that includes congestive heart failure, COPD, chronic respiratory failure on oxygen, history of atrial fibrillation, hypertension.  She already takes torsemide 20 mg, 2 tablets in the morning and 2 tablets in the afternoon.Marland Kitchen  No chest pain no palpitations.  No fever.  No recent sore throat, no body aches and chills.  She does note that she does not drink a whole lot of water but drinks Coke.  She notes that  she drinks quite a bit of Coke.  She also has been eating saltier foods.  No recent NSAIDs.  No changes in urine.  No other aggravating or relieving factors. No other complaint.   Past Medical History:  Diagnosis Date   Acute diastolic CHF (congestive heart failure) (HCC) 08/03/2020   Acute non-recurrent pansinusitis 10/21/2020   Acute on chronic respiratory failure with hypoxia (HCC) 08/02/2020   Asthma    Asthma exacerbation 08/02/2020   Atrial fibrillation (HCC)    Dysphagia 08/02/2020   Dyspnea 05/09/2021   Elevated troponin 09/14/2020   High cholesterol    HTN (hypertension)    Hyperkalemia 09/14/2020   Hyponatremia 08/02/2020   Low grade squamous intraepithelial lesion on cytologic smear of cervix (LGSIL) 12/02/2019   Moderate persistent asthma without complication 12/02/2019   Non-healing wound of right lower extremity 08/01/2022   Respiratory failure with hypoxia (HCC)    Sore throat 11/16/2020   Current Outpatient Medications on File Prior to Visit  Medication Sig Dispense Refill   alendronate (FOSAMAX) 70 MG tablet TAKE 1 TABLET BY MOUTH EVERY SUNDAY 12 tablet 1   apixaban (ELIQUIS) 5 MG TABS tablet Take 1 tablet (5 mg total) by mouth 2 (two) times daily. 180 tablet 1   atorvastatin (LIPITOR) 40 MG tablet Take 1 tablet (40 mg total) by mouth daily. 90 tablet 3   Calcium-Magnesium-Vitamin D (CALCIUM 1200+D3 PO) Take 1 tablet by mouth in the morning.     cetirizine (ZYRTEC)  10 MG tablet TAKE 1 TABLET BY MOUTH EVERY DAY 30 tablet 11   Cyanocobalamin (B-12) 1000 MCG SUBL Place 1 tablet under the tongue daily at 6 (six) AM. 90 tablet 3   fluticasone (FLONASE) 50 MCG/ACT nasal spray Place 1 spray into both nostrils 2 (two) times daily as needed for allergies or rhinitis.     Fluticasone-Umeclidin-Vilant (TRELEGY ELLIPTA) 100-62.5-25 MCG/ACT AEPB Inhale 1 puff into the lungs daily. 180 each 3   guaiFENesin (MUCINEX) 600 MG 12 hr tablet TAKE 1 TABLET BY MOUTH 2 TIMES DAILY 30  tablet 0   LORazepam (ATIVAN) 1 MG tablet TAKE 1 TABLET BY MOUTH 2 TIMES DAILY AS NEEDED FOR ANXIETY 60 tablet 2   Menthol (COUGH DROPS) 5.8 MG LOZG Use as directed 1 lozenge (5.8 mg total) in the mouth or throat as needed (for dry, sore, or scratchy throat). 36 lozenge 11   metoprolol tartrate (LOPRESSOR) 25 MG tablet TAKE 1 TABLET BY MOUTH 2 TIMES DAILY 180 tablet 1   mirtazapine (REMERON) 45 MG tablet Take 1 tablet (45 mg total) by mouth at bedtime as needed (sleep). 90 tablet 3   montelukast (SINGULAIR) 10 MG tablet Take 10 mg by mouth at bedtime.     Multiple Vitamin (MULTIVITAMIN WITH MINERALS) TABS tablet Take 1 tablet by mouth daily with breakfast.     OXYGEN Inhale 2 L/min into the lungs continuous.     pantoprazole (PROTONIX) 20 MG tablet TAKE 1 TABLET BY MOUTH EVERY DAY 90 tablet 1   potassium chloride (KLOR-CON M) 20 MEQ tablet Take 1 tablet (20 mEq total) by mouth daily. 90 tablet 3   sertraline (ZOLOFT) 100 MG tablet TAKE 1 TABLET BY MOUTH AT BEDTIME 90 tablet 1   torsemide (DEMADEX) 20 MG tablet TAKE 2 TABLETS DAILY EVERY MORNING. TAKE AN ADDITIONAL 2 TABLETS AS NEEDED FOR WEIGHT GAIN OF 2 POUNDS OVERNIGHT OR 5 POUNDS IN ONE WEEK. 230 tablet 1   Vitamin D, Ergocalciferol, (DRISDOL) 1.25 MG (50000 UNIT) CAPS capsule Take 1 capsule (50,000 Units total) by mouth every 7 (seven) days. 12 capsule 3   Blood Glucose Monitoring Suppl DEVI 1 each by Does not apply route in the morning, at noon, and at bedtime. May substitute to any manufacturer covered by patient's insurance. 1 each 0   Elastic Bandages & Supports (MEDICAL COMPRESSION STOCKINGS) MISC Two pair knee high 30-35mmHg compression stockings. Sized XL. As covered by insurance for ICD-10: I50.33, R60.0. (Patient not taking: Reported on 04/16/2023) 2 each 1   Glucose Blood (BLOOD GLUCOSE TEST STRIPS) STRP For monitoring blood sugar levels up to 4 times a day. May substitute to any manufacturer covered by patient's insurance. 200 strip 99    ipratropium-albuterol (DUONEB) 0.5-2.5 (3) MG/3ML SOLN Inhale 3 mLs into the lungs every 6 (six) hours as needed (wheezing). (Patient not taking: Reported on 04/16/2023) 75 mL 12   Lancet Device MISC For monitoring blood sugar up to 4 times a day. May substitute to any manufacturer covered by patient's insurance. 200 each 99   Lancets Misc. MISC For monitoring blood sugar up to 4 times a day. May substitute to any manufacturer covered by patient's insurance. 200 each 99   Spacer/Aero-Holding Chambers (AEROCHAMBER MV) inhaler Use as instructed 1 each 0   No current facility-administered medications on file prior to visit.    Review of Systems As in subjective    Objective:   Physical Exam Due to coronavirus pandemic stay at home measures, patient visit was virtual  and they were not examined in person.    Pulse 80   SpO2 92% Comment: with 2L of oxygen  Wt Readings from Last 3 Encounters:  03/22/23 225 lb (102.1 kg)  02/28/23 234 lb 12.8 oz (106.5 kg)  02/09/23 239 lb 8 oz (108.6 kg)   Gen: No acute distress, answers question currently, seated with oxygen Due to video locking up I could not really see her legs      Assessment:     Encounter Diagnoses  Name Primary?   SOB (shortness of breath) Yes   Edema, unspecified type    Hypothyroidism, unspecified type    Chronic diastolic congestive heart failure (HCC)    COPD with acute exacerbation (HCC)    Oxygen dependent    Chronic anticoagulation        Plan:     We discussed symptoms and concerns.  I suspect she is in acute on chronic heart failure.  She declined however to go to the emergency department or call 911.  She relies on transportation from the nursing facility.  She refuses emergency department wants to come in here tomorrow morning for in person evaluation.  She has not yet taken her afternoon torsemide.  She will go ahead and take an extra half tablets a day in addition to her 2 tablets and she will follow-up  here first in the morning.  We discussed differential which could include CHF, pneumonia, COPD exacerbation, kidney failure or other.   Juel was seen today for foot swelling.  Diagnoses and all orders for this visit:  SOB (shortness of breath)  Edema, unspecified type  Hypothyroidism, unspecified type  Chronic diastolic congestive heart failure (HCC)  COPD with acute exacerbation (HCC)  Oxygen dependent  Chronic anticoagulation    F/u tomorrow as planned

## 2023-04-16 NOTE — Telephone Encounter (Signed)
 Copied from CRM [id number]. Topic: Clinical - Red Word Triage >> Apr 16, 2023  8:58 AM Franchot Heidelberg wrote: Red Word that prompted transfer to Nurse Triage: Pt is experiencing severe swelling in her feet, taking her medicine however swelling persists. Also congested with shortness of breath bc of cough.   Chief Complaint: Swelling to both feet, ankles, SOB with exertion. Taking Lasix. No transportation today for in person OV. VV made. Symptoms: Above Frequency: Last week Pertinent Negatives: Patient denies chest pain Disposition: [] ED /[] Urgent Care (no appt availability in office) / [x] Appointment(In office/virtual)/ []  [facility]/ [] Home Care/ [] Refused Recommended Disposition /[] [facility]/ []  Follow-up with PCP Additional Notes: Agrees with appointment.  Answer Assessment - Initial Assessment Questions 1. ONSET: "When did the swelling start?" (e.g., minutes, hours, days)     Last week 2. LOCATION: "What part of the leg is swollen?"  "Are both legs swollen or just one leg?"     Both legs 3. SEVERITY: "How bad is the swelling?" (e.g., localized; mild, moderate, severe)   - Localized: Small area of swelling localized to one leg.   - MILD pedal edema: Swelling limited to foot and ankle, pitting edema < 1/4 inch (6 mm) deep, rest and elevation eliminate most or all swelling.   - MODERATE edema: Swelling of lower leg to knee, pitting edema > 1/4 inch (6 mm) deep, rest and elevation only partially reduce swelling.   - SEVERE edema: Swelling extends above knee, facial or hand swelling present.      Mild- moderate 4. REDNESS: "Does the swelling look red or infected?"     Yes 5. PAIN: "Is the swelling painful to touch?" If Yes, ask: "How painful is it?"   (Scale 1-10; mild, moderate or severe)     No 6. FEVER: "Do you have a fever?" If Yes, ask: "What is it, how was it measured, and when did it start?"      No 7. CAUSE: "What do you think is causing the leg swelling?"      Unsure 8. MEDICAL HISTORY: "Do you have a history of blood clots (e.g., DVT), cancer, heart failure, kidney disease, or liver failure?"     No 9. RECURRENT SYMPTOM: "Have you had leg swelling before?" If Yes, ask: "When was the last time?" "What happened that time?"     Yes 10. OTHER SYMPTOMS: "Do you have any other symptoms?" (e.g., chest pain, difficulty breathing)       SOB 11. PREGNANCY: "Is there any chance you are pregnant?" "When was your last menstrual period?"       No  Protocols used: Leg Swelling and Edema-A-AH

## 2023-04-17 ENCOUNTER — Ambulatory Visit
Admission: RE | Admit: 2023-04-17 | Discharge: 2023-04-17 | Disposition: A | Discharge status: Home / Self Care | Source: Ambulatory Visit | Attending: Medical | Admitting: Medical

## 2023-04-17 ENCOUNTER — Ambulatory Visit (INDEPENDENT_AMBULATORY_CARE_PROVIDER_SITE_OTHER): Admitting: Medical

## 2023-04-17 VITALS — BP 118/60 | HR 98 | Temp 98.9°F | Resp 16 | Wt 225.4 lb

## 2023-04-17 DIAGNOSIS — I5032 Chronic diastolic (congestive) heart failure: Secondary | ICD-10-CM

## 2023-04-17 DIAGNOSIS — I152 Hypertension secondary to endocrine disorders: Secondary | ICD-10-CM

## 2023-04-17 DIAGNOSIS — J984 Other disorders of lung: Secondary | ICD-10-CM | POA: Diagnosis not present

## 2023-04-17 DIAGNOSIS — Z7901 Long term (current) use of anticoagulants: Secondary | ICD-10-CM

## 2023-04-17 DIAGNOSIS — R0602 Shortness of breath: Secondary | ICD-10-CM | POA: Diagnosis not present

## 2023-04-17 DIAGNOSIS — J4521 Mild intermittent asthma with (acute) exacerbation: Secondary | ICD-10-CM

## 2023-04-17 DIAGNOSIS — J42 Unspecified chronic bronchitis: Secondary | ICD-10-CM

## 2023-04-17 DIAGNOSIS — E039 Hypothyroidism, unspecified: Secondary | ICD-10-CM | POA: Diagnosis not present

## 2023-04-17 DIAGNOSIS — Z9981 Dependence on supplemental oxygen: Secondary | ICD-10-CM

## 2023-04-17 DIAGNOSIS — J9611 Chronic respiratory failure with hypoxia: Secondary | ICD-10-CM

## 2023-04-17 DIAGNOSIS — R600 Localized edema: Secondary | ICD-10-CM

## 2023-04-17 DIAGNOSIS — R609 Edema, unspecified: Secondary | ICD-10-CM | POA: Diagnosis not present

## 2023-04-17 DIAGNOSIS — J302 Other seasonal allergic rhinitis: Secondary | ICD-10-CM

## 2023-04-17 DIAGNOSIS — E1159 Type 2 diabetes mellitus with other circulatory complications: Secondary | ICD-10-CM

## 2023-04-17 NOTE — Progress Notes (Signed)
 Subjective:  Lindsay [name] is a 83 y.o. female who presents for Chief Complaint  Patient presents with   Follow-up    Follow-up on feet swelling and SOB     Here for follow up in person from yesterday's virtual consult.  She lives in assisted living at [facility].   She did take an additional half tablet of torsemide yesterday afternoon to equal to 2-1/2 tablets yesterday evening.  She feels a little bit of improvement in her leg swelling today.  When we spoke yesterday she had had a week of worse swelling, worse shortness of breath.  She is on 2 L of oxygen around-the-clock.  She has medical history that includes congestive heart failure, COPD, chronic respiratory failure on oxygen, history of atrial fibrillation, hypertension.  She already takes torsemide 20 mg, 2 tablets in the morning and 2 tablets in the afternoon.Marland Kitchen  No chest pain no palpitations.  No fever.  No recent sore throat, no body aches and chills.   She does note that she does not drink a whole lot of water but drinks Coke.  She notes that she drinks quite a bit of Coke.  She also has been eating saltier foods.  No recent NSAIDs.   No changes in urine.   No other aggravating or relieving factors. No other complaint.   The following portions of the patient's history were reviewed and updated as appropriate: allergies, current medications, past family history, past medical history, past social history, past surgical history and problem list.  ROS Otherwise as in subjective above  Objective: BP 118/60   Pulse 98   Temp 98.9 F (37.2 C)   Resp 16   Wt 225 lb 6.4 oz (102.2 kg)   SpO2 96% Comment: 2 liters of 02  BMI 34.27 kg/m   BP Readings from Last 3 Encounters:  04/17/23 118/60  03/22/23 100/72  03/22/23 124/80    Wt Readings from Last 3 Encounters:  04/17/23 225 lb 6.4 oz (102.2 kg)  03/22/23 225 lb (102.1 kg)  02/28/23 234 lb 12.8 oz (106.5 kg)    General appearance: alert, no distress, well  developed, well nourished, nasal cannula in place, on 2 L of oxygen, seated in a wheelchair HEENT: normocephalic, sclerae anicteric, conjunctiva pink and moist, nares patent, no discharge or erythema, pharynx normal Oral cavity: MMM, no lesions Neck: supple, no lymphadenopathy, no thyromegaly, no masses Heart: Somewhat irregular, skipped beats periodically but runs of normal rhythm as well, no murmur  Lungs: Faint lower field crackles noted bilaterally, otherwise lungs relatively clear  abdomen: +bs, soft, non tender, non distended, no masses, no hepatomegaly, no splenomegaly Pulses: 2+ radial pulses, 1+ pedal pulses, normal cap refill Ext: 1-2+ nonpitting lower extremity edema but not as tight and glossy appearing as they were yesterday    Assessment: Encounter Diagnoses  Name Primary?   SOB (shortness of breath) Yes   Edema, unspecified type    Chronic bronchitis, unspecified chronic bronchitis type (HCC)    Chronic respiratory failure with hypoxia (HCC)    Chronic diastolic congestive heart failure (HCC)    Chronic anticoagulation    Bilateral lower extremity edema    Oxygen dependent    Hypothyroidism, unspecified type    Hypertension associated with diabetes (HCC)      Plan: Fortunately your weight is stable and your vital signs are relatively stable.  I suspect you are a little overloaded with your heart with fluid which could be related to recent diet and salt  intake  I am reaching out to your cardiologist today for some additional advice.  For now continue with torsemide 20 mg, 2 tablets twice a day.  Take an extra one half this morning so do 2-1/2 tablets this morning but 2 tablets this afternoon  We may call back with other recommendations today or tomorrow morning  I am checking some additional labs on you today  I do recommend updating chest x-ray again given the fluid in your lungs on x-ray about a month ago.  I placed an x-ray order.  You can go at your  convenience for this x-ray.  I recommend you significantly limit any salty foods or soda as soda contains a lot of salt.  Salt makes you retain excess fluid.  I recommend maybe 2 or 3 fruits a day, eat vegetables throughout the day, evening servings of meat  Drink around 80 ounces of water daily.  Limit caffeine as well.  I do recommend you have a follow-up with your heart doctor soon.    Please go to [facility] for your chest  xray.   Their hours are 8am - 4:30 pm Monday - Friday.  Take your insurance card with you.  [facility] [phone]   [address] [place], [place] [address]  Amantha was seen today for follow-up.  Diagnoses and all orders for this visit:  SOB (shortness of breath) -     Basic metabolic panel -     TSH + free T4 -     Brain natriuretic peptide -     DG Chest 2 View; Future  Edema, unspecified type -     Basic metabolic panel -     TSH + free T4 -     Brain natriuretic peptide -     DG Chest 2 View; Future  Chronic bronchitis, unspecified chronic bronchitis type (HCC)  Chronic respiratory failure with hypoxia (HCC) -     DG Chest 2 View; Future  Chronic diastolic congestive heart failure (HCC) -     Brain natriuretic peptide -     DG Chest 2 View; Future  Chronic anticoagulation  Bilateral lower extremity edema  Oxygen dependent  Hypothyroidism, unspecified type -     TSH + free T4  Hypertension associated with diabetes (HCC)    Follow up: Pending labs

## 2023-04-17 NOTE — Patient Instructions (Signed)
 Fortunately your weight is stable and your vital signs are relatively stable.  I suspect you are a little overloaded with your heart with fluid which could be related to recent diet and salt intake  I am reaching out to your cardiologist today for some additional advice.  For now continue with torsemide 20 mg, 2 tablets twice a day.  Take an extra one half this morning so do 2-1/2 tablets this morning but 2 tablets this afternoon  We may call back with other recommendations today or tomorrow morning  I am checking some additional labs on you today  I do recommend updating chest x-ray again given the fluid in your lungs on x-ray about a month ago.  I placed an x-ray order.  You can go at your convenience for this x-ray.  I recommend you significantly limit any salty foods or soda as soda contains a lot of salt.  Salt makes you retain excess fluid.  I recommend maybe 2 or 3 fruits a day, eat vegetables throughout the day, evening servings of meat  Drink around 80 ounces of water daily.  Limit caffeine as well.  I do recommend you have a follow-up with your heart doctor soon.    Please go to [facility] for your chest  xray.   Their hours are 8am - 4:30 pm Monday - Friday.  Take your insurance card with you.  [facility] [phone]   [address] [place], [place] [address]

## 2023-04-18 ENCOUNTER — Other Ambulatory Visit: Payer: Self-pay | Admitting: Medical

## 2023-04-18 DIAGNOSIS — Z09 Encounter for follow-up examination after completed treatment for conditions other than malignant neoplasm: Secondary | ICD-10-CM

## 2023-04-18 DIAGNOSIS — E876 Hypokalemia: Secondary | ICD-10-CM

## 2023-04-18 LAB — BASIC METABOLIC PANEL
BUN/Creatinine Ratio: 13 (ref 12–28)
BUN: 14 mg/dL (ref 8–27)
CO2: 29 mmol/L (ref 20–29)
Calcium: 8.8 mg/dL (ref 8.7–10.3)
Chloride: 96 mmol/L (ref 96–106)
Creatinine, Ser: 1.04 mg/dL — ABNORMAL HIGH (ref 0.57–1.00)
Glucose: 99 mg/dL (ref 70–99)
Potassium: 3 mmol/L — ABNORMAL LOW (ref 3.5–5.2)
Sodium: 141 mmol/L (ref 134–144)
eGFR: 54 mL/min/{1.73_m2} — ABNORMAL LOW (ref >59)

## 2023-04-18 LAB — TSH+FREE T4
Free T4: 1.6 ng/dL (ref 0.82–1.77)
TSH: 1.39 u[IU]/mL (ref 0.450–4.500)

## 2023-04-18 LAB — BRAIN NATRIURETIC PEPTIDE: BNP: 122.5 pg/mL — ABNORMAL HIGH (ref 0.0–100.0)

## 2023-04-18 MED ORDER — SPIRONOLACTONE 25 MG PO TABS
12.5000 mg | ORAL_TABLET | Freq: Every day | ORAL | 1 refills | Status: DC
Start: 2023-04-18 — End: 2023-05-08

## 2023-04-18 MED ORDER — POTASSIUM CHLORIDE CRYS ER 20 MEQ PO TBCR: 20.0000 meq | EXTENDED_RELEASE_TABLET | Freq: Two times a day (BID) | ORAL | 0 refills | Status: DC

## 2023-04-18 NOTE — Progress Notes (Signed)
 Potassium was low, thyroid okay, still pending BNP heart marker.  I did speak to cardiology.  Lets add spironolactone 12.5 mg daily.  This is a second type of fluid pill.  Take this in the morning  Begin potassium twice daily since we are having use a little bit more fluid pill  For the next 3 days use 3 of your torsemide 20 mg which equals 60 mg in the morning but continue the 2 tablets in the afternoon  I would plan a follow-up in a week to see how she is doing.  Continue plan to see cardiology as scheduled

## 2023-04-18 NOTE — Progress Notes (Signed)
 Did you call and talk to her.  This should not just be my chart message, we need to verbally talk to her about the results

## 2023-04-20 NOTE — Progress Notes (Signed)
 Chest x-ray shows increased fluid in the lungs from back up from the heart.  See lab result message from yesterday make sure she is clear on the instructions and follow-up in 1 week/next week in office

## 2023-04-26 ENCOUNTER — Telehealth: Admitting: Medical

## 2023-05-01 ENCOUNTER — Ambulatory Visit: Payer: Medicare Other | Admitting: Pulmonary Disease

## 2023-05-01 DIAGNOSIS — I739 Peripheral vascular disease, unspecified: Secondary | ICD-10-CM | POA: Diagnosis not present

## 2023-05-01 DIAGNOSIS — B351 Tinea unguium: Secondary | ICD-10-CM | POA: Diagnosis not present

## 2023-05-01 DIAGNOSIS — L600 Ingrowing nail: Secondary | ICD-10-CM | POA: Diagnosis not present

## 2023-05-01 DIAGNOSIS — M79671 Pain in right foot: Secondary | ICD-10-CM | POA: Diagnosis not present

## 2023-05-02 ENCOUNTER — Emergency Department (HOSPITAL_COMMUNITY)
Admission: EM | Admit: 2023-05-02 | Discharge: 2023-05-03 | Disposition: A | Discharge status: Skilled Nursing Facility | Attending: Emergency Medicine | Admitting: Emergency Medicine

## 2023-05-02 ENCOUNTER — Emergency Department (HOSPITAL_COMMUNITY)

## 2023-05-02 ENCOUNTER — Encounter (HOSPITAL_COMMUNITY): Payer: Self-pay

## 2023-05-02 ENCOUNTER — Other Ambulatory Visit: Payer: Self-pay

## 2023-05-02 DIAGNOSIS — I4891 Unspecified atrial fibrillation: Secondary | ICD-10-CM | POA: Diagnosis not present

## 2023-05-02 DIAGNOSIS — W19XXXA Unspecified fall, initial encounter: Secondary | ICD-10-CM

## 2023-05-02 DIAGNOSIS — S199XXA Unspecified injury of neck, initial encounter: Secondary | ICD-10-CM | POA: Diagnosis not present

## 2023-05-02 DIAGNOSIS — S0003XA Contusion of scalp, initial encounter: Secondary | ICD-10-CM | POA: Diagnosis not present

## 2023-05-02 DIAGNOSIS — R55 Syncope and collapse: Secondary | ICD-10-CM | POA: Diagnosis not present

## 2023-05-02 DIAGNOSIS — Y9301 Activity, walking, marching and hiking: Secondary | ICD-10-CM | POA: Diagnosis not present

## 2023-05-02 DIAGNOSIS — S0083XA Contusion of other part of head, initial encounter: Secondary | ICD-10-CM | POA: Diagnosis not present

## 2023-05-02 DIAGNOSIS — Z043 Encounter for examination and observation following other accident: Secondary | ICD-10-CM | POA: Diagnosis not present

## 2023-05-02 DIAGNOSIS — S0990XA Unspecified injury of head, initial encounter: Secondary | ICD-10-CM

## 2023-05-02 DIAGNOSIS — Z7901 Long term (current) use of anticoagulants: Secondary | ICD-10-CM | POA: Diagnosis not present

## 2023-05-02 DIAGNOSIS — W0110XA Fall on same level from slipping, tripping and stumbling with subsequent striking against unspecified object, initial encounter: Secondary | ICD-10-CM | POA: Diagnosis not present

## 2023-05-02 DIAGNOSIS — I517 Cardiomegaly: Secondary | ICD-10-CM | POA: Diagnosis not present

## 2023-05-02 LAB — COMPREHENSIVE METABOLIC PANEL WITH GFR
ALT: 21 U/L (ref 0–44)
AST: 34 U/L (ref 15–41)
Albumin: 3.6 g/dL (ref 3.5–5.0)
Alkaline Phosphatase: 77 U/L (ref 38–126)
Anion gap: 15 (ref 5–15)
BUN: 16 mg/dL (ref 8–23)
CO2: 26 mmol/L (ref 22–32)
Calcium: 9.3 mg/dL (ref 8.9–10.3)
Chloride: 95 mmol/L — ABNORMAL LOW (ref 98–111)
Creatinine, Ser: 1.03 mg/dL — ABNORMAL HIGH (ref 0.44–1.00)
GFR, Estimated: 54 mL/min — ABNORMAL LOW (ref >60)
Glucose, Bld: 101 mg/dL — ABNORMAL HIGH (ref 70–99)
Potassium: 3.8 mmol/L (ref 3.5–5.1)
Sodium: 136 mmol/L (ref 135–145)
Total Bilirubin: 1 mg/dL (ref 0.0–1.2)
Total Protein: 7 g/dL (ref 6.5–8.1)

## 2023-05-02 LAB — CBC WITH DIFFERENTIAL/PLATELET
Abs Immature Granulocytes: 0.05 10*3/uL (ref 0.00–0.07)
Basophils Absolute: 0.1 10*3/uL (ref 0.0–0.1)
Basophils Relative: 1 %
Eosinophils Absolute: 0.1 10*3/uL (ref 0.0–0.5)
Eosinophils Relative: 1 %
HCT: 38 % (ref 36.0–46.0)
Hemoglobin: 12.4 g/dL (ref 12.0–15.0)
Immature Granulocytes: 1 %
Lymphocytes Relative: 11 %
Lymphs Abs: 1 10*3/uL (ref 0.7–4.0)
MCH: 28.7 pg (ref 26.0–34.0)
MCHC: 32.6 g/dL (ref 30.0–36.0)
MCV: 88 fL (ref 80.0–100.0)
Monocytes Absolute: 0.7 10*3/uL (ref 0.1–1.0)
Monocytes Relative: 8 %
Neutro Abs: 7 10*3/uL (ref 1.7–7.7)
Neutrophils Relative %: 78 %
Platelets: 278 10*3/uL (ref 150–400)
RBC: 4.32 MIL/uL (ref 3.87–5.11)
RDW: 16.3 % — ABNORMAL HIGH (ref 11.5–15.5)
WBC: 8.9 10*3/uL (ref 4.0–10.5)
nRBC: 0 % (ref 0.0–0.2)

## 2023-05-02 LAB — I-STAT CHEM 8, ED
BUN: 23 mg/dL (ref 8–23)
Calcium, Ion: 1.08 mmol/L — ABNORMAL LOW (ref 1.15–1.40)
Chloride: 97 mmol/L — ABNORMAL LOW (ref 98–111)
Creatinine, Ser: 1.2 mg/dL — ABNORMAL HIGH (ref 0.44–1.00)
Glucose, Bld: 101 mg/dL — ABNORMAL HIGH (ref 70–99)
HCT: 37 % (ref 36.0–46.0)
Hemoglobin: 12.6 g/dL (ref 12.0–15.0)
Potassium: 3.9 mmol/L (ref 3.5–5.1)
Sodium: 137 mmol/L (ref 135–145)
TCO2: 32 mmol/L (ref 22–32)

## 2023-05-02 LAB — TROPONIN I (HIGH SENSITIVITY): Troponin I (High Sensitivity): 9 ng/L (ref <18)

## 2023-05-02 LAB — CBG MONITORING, ED: Glucose-Capillary: 102 mg/dL — ABNORMAL HIGH (ref 70–99)

## 2023-05-02 NOTE — ED Notes (Signed)
 Purewick placed at [id number]

## 2023-05-02 NOTE — ED Notes (Signed)
 Called and placed PT on monitor with CCMD.

## 2023-05-02 NOTE — ED Triage Notes (Signed)
 PT BIB [facility] from home. PT got up out of her chair and was walking to the sink when she lost her footing and fell.PT does not recall whether or not she lost consciousness or if she hit her head.PT does have a bruise on the left side of her head.

## 2023-05-02 NOTE — ED Provider Notes (Signed)
 [place] EMERGENCY DEPARTMENT AT [facility] Provider Note   CSN: [id number] Arrival date & time: 05/02/23  2149     History  Chief Complaint  Patient presents with   Loss of Consciousness   Trauma    Lindsay [name] is a 83 y.o. female on eliquis for history of persistent atrial fibrillation who presents the emergency department with chief complaint of trauma.  Patient thinks that she lost consciousness and then fell.  She arrived with visible signs of trauma to the left side of her forehead.  Patient states that she got up from her kitchen table to go to the bathroom and then does not really remember what happened but knows she fell and hit her head.  She denies pain in her neck or lower body.  She does denies cp, palpitations or melena   HPI     Home Medications Prior to Admission medications   Medication Sig Start Date End Date Taking? Authorizing Provider  alendronate (FOSAMAX) 70 MG tablet TAKE 1 TABLET BY MOUTH EVERY SUNDAY 03/26/23   Early, [name], NP  apixaban (ELIQUIS) 5 MG TABS tablet Take 1 tablet (5 mg total) by mouth 2 (two) times daily. 03/08/23   Hilty, Aviva Lemmings, MD  atorvastatin (LIPITOR) 40 MG tablet Take 1 tablet (40 mg total) by mouth daily. 04/03/23 07/02/23  Early, [name], NP  Blood Glucose Monitoring Suppl DEVI 1 each by Does not apply route in the morning, at noon, and at bedtime. May substitute to any manufacturer covered by patient's insurance. 11/29/22   Early, [name], NP  Calcium-Magnesium-Vitamin D (CALCIUM 1200+D3 PO) Take 1 tablet by mouth in the morning.    [provider]  cetirizine (ZYRTEC) 10 MG tablet TAKE 1 TABLET BY MOUTH EVERY DAY 02/12/23   Dewald, Jonathan B, MD  Cyanocobalamin (B-12) 1000 MCG SUBL Place 1 tablet under the tongue daily at 6 (six) AM. 11/28/22   Early, Adriane Albe, NP  Elastic Bandages & Supports (MEDICAL COMPRESSION STOCKINGS) MISC Two pair knee high 30-55mmHg compression stockings. Sized XL. As covered by insurance for  ICD-10: I50.33, R60.0. Patient not taking: Reported on 04/16/2023 03/09/21   Early, [name], NP  fluticasone (FLONASE) 50 MCG/ACT nasal spray Place 1 spray into both nostrils 2 (two) times daily as needed for allergies or rhinitis.    [provider]  Fluticasone-Umeclidin-Vilant (TRELEGY ELLIPTA) 100-62.5-25 MCG/ACT AEPB Inhale 1 puff into the lungs daily. 10/18/22   Wilfredo Hanly, MD  Glucose Blood (BLOOD GLUCOSE TEST STRIPS) STRP For monitoring blood sugar levels up to 4 times a day. May substitute to any manufacturer covered by patient's insurance. 11/29/22   Early, [name], NP  guaiFENesin (MUCINEX) 600 MG 12 hr tablet TAKE 1 TABLET BY MOUTH 2 TIMES DAILY Patient not taking: Reported on 04/17/2023 03/22/22   Cobb, Katherine V, NP  ipratropium-albuterol (DUONEB) 0.5-2.5 (3) MG/3ML SOLN Inhale 3 mLs into the lungs every 6 (six) hours as needed (wheezing). Patient not taking: Reported on 04/17/2023 02/08/23   Wilfredo Hanly, MD  Lancet Device MISC For monitoring blood sugar up to 4 times a day. May substitute to any manufacturer covered by patient's insurance. 11/29/22   Early, [name], NP  Lancets Misc. MISC For monitoring blood sugar up to 4 times a day. May substitute to any manufacturer covered by patient's insurance. 11/29/22   Early, [name], NP  LORazepam (ATIVAN) 1 MG tablet TAKE 1 TABLET BY MOUTH 2 TIMES DAILY AS NEEDED  FOR ANXIETY 04/06/23   Early, [name], NP  Menthol (COUGH DROPS) 5.8 MG LOZG Use as directed 1 lozenge (5.8 mg total) in the mouth or throat as needed (for dry, sore, or scratchy throat). 11/16/20   Early, [name], NP  metoprolol tartrate (LOPRESSOR) 25 MG tablet TAKE 1 TABLET BY MOUTH 2 TIMES DAILY 03/05/23   Early, [name], NP  mirtazapine (REMERON) 45 MG tablet Take 1 tablet (45 mg total) by mouth at bedtime as needed (sleep). 04/06/23   Early, [name], NP  montelukast (SINGULAIR) 10 MG tablet Take 10 mg by mouth at bedtime.    [provider]  Multiple Vitamin  (MULTIVITAMIN WITH MINERALS) TABS tablet Take 1 tablet by mouth daily with breakfast.    [provider]  OXYGEN Inhale 2 L/min into the lungs continuous.    [provider]  pantoprazole (PROTONIX) 20 MG tablet TAKE 1 TABLET BY MOUTH EVERY DAY 01/11/23   Early, [name], NP  potassium chloride SA (KLOR-CON M) 20 MEQ tablet Take 1 tablet (20 mEq total) by mouth 2 (two) times daily. 04/18/23   Tysinger, Christiane Cowing, [name]  sertraline (ZOLOFT) 100 MG tablet TAKE 1 TABLET BY MOUTH AT BEDTIME 01/15/23   Early, [name], NP  Spacer/Aero-Holding Chambers (AEROCHAMBER MV) inhaler Use as instructed 09/29/22   Wilfredo Hanly, MD  spironolactone (ALDACTONE) 25 MG tablet Take 0.5 tablets (12.5 mg total) by mouth daily. 04/18/23 04/17/24  Tysinger, Christiane Cowing, [name]  torsemide (DEMADEX) 20 MG tablet TAKE 2 TABLETS DAILY EVERY MORNING. TAKE AN ADDITIONAL 2 TABLETS AS NEEDED FOR WEIGHT GAIN OF 2 POUNDS OVERNIGHT OR 5 POUNDS IN ONE WEEK. 02/09/23   Walker, [name], NP  Vitamin D, Ergocalciferol, (DRISDOL) 1.25 MG (50000 UNIT) CAPS capsule Take 1 capsule (50,000 Units total) by mouth every 7 (seven) days. 04/04/23   Early, [name], NP      Allergies    Acetaminophen-codeine, Naproxen-esomeprazole mg, and Codeine    Review of Systems   Review of Systems  Physical Exam Updated Vital Signs There were no vitals taken for this visit. Physical Exam Vitals and nursing note reviewed.  Constitutional:      General: She is not in acute distress.    Appearance: She is well-developed. She is not diaphoretic.  HENT:     Head: Normocephalic.     Comments: Bruising of the left forehead    Right Ear: External ear normal.     Left Ear: External ear normal.     Nose: Nose normal.     Mouth/Throat:     Mouth: Mucous membranes are moist.  Eyes:     General: No scleral icterus.    Extraocular Movements: Extraocular movements intact.     Conjunctiva/sclera: Conjunctivae normal.     Pupils: Pupils are equal, round,  and reactive to light.     Comments: No signs of entrapment  Cardiovascular:     Rate and Rhythm: Normal rate and regular rhythm.     Heart sounds: Normal heart sounds. No murmur heard.    No friction rub. No gallop.  Pulmonary:     Effort: Pulmonary effort is normal. No respiratory distress.     Breath sounds: Normal breath sounds.  Abdominal:     General: Bowel sounds are normal. There is no distension.     Palpations: Abdomen is soft. There is no mass.     Tenderness: There is no abdominal tenderness. There is no guarding.  Musculoskeletal:  Cervical back: Normal range of motion.     Right lower leg: No edema.     Left lower leg: No edema.  Skin:    General: Skin is warm and dry.  Neurological:     Mental Status: She is alert and oriented to person, place, and time.  Psychiatric:        Behavior: Behavior normal.     ED Results / Procedures / Treatments   Labs (all labs ordered are listed, but only abnormal results are displayed) Labs Reviewed  CBC WITH DIFFERENTIAL/PLATELET  COMPREHENSIVE METABOLIC PANEL WITH GFR  CBG MONITORING, ED  I-STAT CHEM 8, ED  TROPONIN I (HIGH SENSITIVITY)    EKG None  Radiology No results found.  Procedures Procedures    Medications Ordered in ED Medications - No data to display  ED Course/ Medical Decision Making/ A&P Clinical Course as of 05/03/23 1755  Wed May 02, 2023  2329 Hemoglobin: 12.4 [AH]  2329 Troponin I (High Sensitivity): 9 [AH]  2329 Glucose-Capillary(!): 102 [AH]  2351 Level II trauma, syncopal episode, left side of head trauma. Chronic afib, rate controlled, on eliquis, hgb stable. If scans normal can go home [CG]    Clinical Course User Index [AH] Azrael Huss, [name] [CG] Adel Aden, [name]                                 Medical Decision Making Amount and/or Complexity of Data Reviewed Labs: ordered. Decision-making details documented in ED Course. Radiology: ordered.   Reviewed the  patient's labs which shows no evidence of low hemoglobin, normal troponin. Her CT scans are pending.  Signout given to PA Groce at shift handoff.        Final Clinical Impression(s) / ED Diagnoses Final diagnoses:  None    Rx / DC Orders ED Discharge Orders     None         Tama Fails, [name] 05/03/23 1756    Afton Horse T, DO 05/10/23 [id number]

## 2023-05-02 NOTE — ED Notes (Signed)
 Patient transported to CT

## 2023-05-03 DIAGNOSIS — Z7401 Bed confinement status: Secondary | ICD-10-CM | POA: Diagnosis not present

## 2023-05-03 DIAGNOSIS — W19XXXA Unspecified fall, initial encounter: Secondary | ICD-10-CM | POA: Diagnosis not present

## 2023-05-03 DIAGNOSIS — S0990XA Unspecified injury of head, initial encounter: Secondary | ICD-10-CM | POA: Diagnosis not present

## 2023-05-03 LAB — TROPONIN I (HIGH SENSITIVITY): Troponin I (High Sensitivity): 8 ng/L (ref <18)

## 2023-05-03 NOTE — ED Provider Notes (Signed)
  Physical Exam  BP 105/71   Pulse 79   Temp 98.6 F (37 C) (Oral)   Resp 17   Ht 5\' 8"  (1.727 m)   Wt 102.2 kg   SpO2 98%   BMI 34.26 kg/m   Physical Exam Vitals and nursing note reviewed.  Constitutional:      General: She is not in acute distress.    Appearance: She is not toxic-appearing.  HENT:     Head: Normocephalic and atraumatic.  Pulmonary:     Effort: No respiratory distress.  Skin:    Coloration: Skin is not jaundiced or pale.  Neurological:     Mental Status: She is alert and oriented to person, place, and time.  Psychiatric:        Behavior: Behavior normal.     Procedures  Procedures  ED Course / MDM   Clinical Course as of 05/03/23 0132  Wed May 02, 2023  2329 Hemoglobin: 12.4 [AH]  2329 Troponin I (High Sensitivity): 9 [AH]  2329 Glucose-Capillary(!): 102 [AH]  2351 Level II trauma, syncopal episode, left side of head trauma. Chronic afib, rate controlled, on eliquis, hgb stable. If scans normal can go home [CG]    Clinical Course User Index [AH] Arthor Captain, [name] [CG] Al Decant, [name]   Medical Decision Making Amount and/or Complexity of Data Reviewed Labs: ordered. Decision-making details documented in ED Course. Radiology: ordered.    Patient signed out to me at shift change.  Please see previous doctor note for further details.  In short, 83 year old female presents as a level 2 trauma.  Patient apparently had syncopal event, hit left side of her head.  Patient anticoagulated.  Patient signed out to me pending imaging studies.  Imaging studies have resulted.  Chest x-ray, CT head, cervical spine and maxillofacial scans show no evidence of acute process or fracture.  Per previous provider plan, patient will be discharged home at this time.  She was advised to follow-up with PCP.  Stable to discharge home.    Al Decant, [name] 05/03/23 0132    Nira Conn, MD 05/03/23 [phone]

## 2023-05-03 NOTE — Progress Notes (Signed)
 Orthopedic Tech Progress Note Patient Details:  Lindsay [name] January 11, 1941 [id number]  Patient ID: [name], female   DOB: [birth year], 83 y.o.   MRN: [id number] I attended trauma page. Trinna Post 05/03/2023, 3:10 AM

## 2023-05-03 NOTE — ED Notes (Signed)
 PTS left side of her eye/face is starting to swell.

## 2023-05-03 NOTE — Discharge Instructions (Signed)
 It was a pleasure taking part in your care.  As discussed, CT scans performed today were reassuring and showed no evidence of acute fractures or processes.  Please follow-up with your PCP at your earliest convenience.  Please take ibuprofen or Tylenol every 6 hours as needed for pain.  Return to the ED with any new or worsening symptoms.

## 2023-05-07 ENCOUNTER — Telehealth: Payer: Self-pay

## 2023-05-07 ENCOUNTER — Encounter: Payer: Self-pay | Admitting: Nurse Practitioner

## 2023-05-07 ENCOUNTER — Other Ambulatory Visit: Payer: Self-pay

## 2023-05-07 DIAGNOSIS — M545 Low back pain, unspecified: Secondary | ICD-10-CM

## 2023-05-07 NOTE — Telephone Encounter (Signed)
 Forwarding to you pt. Has been seen recently and it is hard for her to get into the office.   Copied from CRM [id number]. Topic: Clinical - Request for Lab/Test Order >> May 04, 2023  4:25 PM Everette C wrote: Reason for CRM: The patient shares that they fell roughly two nights ago and they were seen in the ED on 05/02/23 and discharged to go home   The patient's daughter has called to request imaging orders for the patient's lower back to assure that there are no major issues

## 2023-05-08 ENCOUNTER — Encounter (HOSPITAL_BASED_OUTPATIENT_CLINIC_OR_DEPARTMENT_OTHER): Payer: Self-pay | Admitting: Family

## 2023-05-08 ENCOUNTER — Ambulatory Visit
Admission: RE | Admit: 2023-05-08 | Discharge: 2023-05-08 | Disposition: A | Discharge status: Home / Self Care | Source: Ambulatory Visit | Attending: Nurse Practitioner

## 2023-05-08 ENCOUNTER — Ambulatory Visit (HOSPITAL_BASED_OUTPATIENT_CLINIC_OR_DEPARTMENT_OTHER): Payer: Medicare Other | Admitting: Family

## 2023-05-08 DIAGNOSIS — I5032 Chronic diastolic (congestive) heart failure: Secondary | ICD-10-CM

## 2023-05-08 DIAGNOSIS — M545 Low back pain, unspecified: Secondary | ICD-10-CM

## 2023-05-08 MED ORDER — TORSEMIDE 20 MG PO TABS: ORAL_TABLET | ORAL | 3 refills | Status: DC

## 2023-05-08 MED ORDER — ACETAMINOPHEN 500 MG PO TABS: 1000.0000 mg | ORAL_TABLET | Freq: Three times a day (TID) | ORAL | 0 refills | Status: AC

## 2023-05-08 MED ORDER — SPIRONOLACTONE 25 MG PO TABS: ORAL_TABLET | ORAL | 2 refills | Status: DC

## 2023-05-08 MED ORDER — SPIRONOLACTONE 25 MG PO TABS: 12.5000 mg | ORAL_TABLET | Freq: Every day | ORAL | 3 refills | Status: DC

## 2023-05-08 NOTE — Patient Instructions (Addendum)
 Medication Instructions:  Your physician has recommended you make the following change in your medication:   Start: tylenol 1,000mg  three times per day   Start: Lidocaine patch over the counter  ___________________________________________  Stop potassium   ____________________________________________  Start: torsemide 40mg  ( 2 tablets) daily   Continue Spironolactone 12.5mg  daily   Start Spirolactone 12.5mg  as needed for increased edema or swelling- up to two times per week  _____________________________________________________________________________________  Lab Work: Your physician recommends that you return for lab work in one to week for [employer] & BNP  Follow-Up: At [facility], you and your health needs are our priority.  As part of our continuing mission to provide you with exceptional heart care, our providers are all part of one team.  This team includes your primary Cardiologist (physician) and Advanced Practice Providers or APPs (Physician Assistants and Nurse Practitioners) who all work together to provide you with the care you need, when you need it.  Your next appointment:   Follow up in 2 months with Neomi Banks, NP

## 2023-05-08 NOTE — Progress Notes (Signed)
 Cardiology Office Note:  .   Date:  05/08/2023  ID:  Sinclair Due, DOB 1940-03-31, MRN [id number] PCP: Annella Kief, NP  [facility] Providers Cardiologist:  Hazle Lites, MD    History of Present Illness: .   Lindsay [name] is a 83 y.o. 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   Atrial fibrillation diagnosed many years ago and Eliquis initiated by primary care. Managed on Torsemide for chronic edema with improvement in fall 2021 after addition of Metolazone.    She was admitted 08/02/20 with acute on chronic hypoxic respiratory failure due to acute on chronic diastolic heart failure. Treated with IV diuresis. She was evaluated by speech therapy and also underwent endoscopy with achalasia recommended for soft diet.    Underwent successful cardioversion 09/13/2020.  Then lost to follow up til admitted 04/2021 with acute on chronic respiratory failure and asthma exacerbation.  Echocardiogram 05/10/2021 during admission with LVEF 60 to 65%, no RWMA, normal pulmonary pressure, left atrium mildly dilated, trivial MR.    Seen 01/02/2022 with rate controlled atrial fibrillation with minimal symptomology recommended for continued rate control.    Updated CTA10/19/24 stable mild dilation aortic arch 4cm. Also noted moderate R and small to moderate L pleural effusions and ground glass in both lungs as well as chronic deformities of the spine.  Admitted 10/18-10/24/24 with acute on chronic respiratory failure and cute on chronic diastolic heart failure.    Last seen in clinic 02/09/2023 with increased dyspnea for which pulmonary had just started Z-Pak, extended prednisone taper.  BP was minimally elevated in the setting of illness.  She saw PCP team via telemedicine 04/16/2023 with  volume overloaded.  They reached out for recommendations.  Torsemide increased to 60 mg for short-term and spironolactone 12.5 mg daily initiated for volume status as well as to increase potassium.  ED visit 05/02/23 with fall with concern for syncope.  Chest x-ray, CT head, cervical spine, maxillofacial scans were unremarkable.  She was recommended for discharge home ILF.  Presents today for follow-up with her daughter.  Notes her feet were falling for 3 weeks and started looking better recently after medication changes.  She is presently been taking torsemide 40 mg in the morning and spironolactone 12.5 mg daily in the morning.  Notes difficulty swallowing potassium supplement due to pill size.  Intermittently will feel lightheaded but no near syncope.  She is unclear whether she had a syncopal event prior to her fall/9/25.  Notes her seizure chest in her home was moved to bed and she thinks it may have been a mechanical fall but she does not recall many of the details.  Having significant lower back pain both at rest and with activity.  She has been taking 1 tablet of 500 mg Tylenol per day.  Discussed this is unlikely to be a therapeutic dose and that Tylenol is safe to take with her current cardiac medications.   ROS: Please see the history of present illness.    All other systems reviewed and are negative.   Studies Reviewed: .           Risk Assessment/Calculations:      CHA2DS2-VASc Score = 6   This indicates a 9.7% annual risk of stroke. The patient's score is based upon: CHF History: 1 HTN History: 1  Diabetes History: 0 Stroke History: 0 Vascular Disease History: 1 Age Score: 2 Gender Score: 1         Physical Exam:   VS:  BP 92/70   Pulse 82   Ht 5\' 8"  (1.727 m)   Wt 226 lb (102.5 kg)   SpO2 98%   BMI 34.36 kg/m    Wt Readings from Last 3 Encounters:  05/08/23 226 lb (102.5 kg)  05/02/23 225 lb 5 oz (102.2 kg)  04/17/23 225 lb 6.4 oz (102.2 kg)    GEN: Well  nourished, overweight, well developed in no acute distress NECK: No JVD; No carotid bruits CARDIAC: IRIR, no murmurs, rubs, gallops RESPIRATORY:  Clear to auscultation without rales, wheezing or rhonchi. On 2L O2. ABDOMEN: Soft, non-tender, non-distended EXTREMITIES:  No edema; No deformity. Lower back with no ecchymosis, tender on exam. FACIAL: significant yellowed and purple ecchymosis around both occipital region L>R  ASSESSMENT AND PLAN: .     Low back pain - Unwitnessed fall 05/02/23. Unclear whether she lost consciousness and whether she landed on her backside or front. Significant ecchymosis to bilateral eyes L>R. ED evaluation with CT head, CT cervical spine, CT maxillofacial unremarkable. Has had persistent low back pain since ED visit. Xray per PCP this morning, results not yet available. Recommend Tylenol 1,000mg  TID for pain control Recommend lidocaine patch to lower back Will route to PCP for any additional recommendations for pain control Encourage use of donut pillow she has at home or to alternate pillow under left buttocks and right buttocks every hour to remove pressure.   HFpEF - Telemedicine visit with PCP 04/16/23 with dyspnea, edema. Torsemide increased to 60mg  daily and Spironolactone 12.5mg  daily added. LE edema much improved. Now relatively hypotensive, reports only intermittent lightheadedness. Recent poor PO intake due to back pain, detailed above. No LE edema on exam. 02/09/23 weight in our clinic 239 lbs ? today 226 lbs.  Continue Torsemide 40mg  daily. Discontinue 20mg  afternoon PRN dosing.  Continue Spironolactone 12.5mg  daily. May take additional 12.5mg  of Spironolactone PRN up to twice per week for LE edema. BMP and BNP in 1 week Stop potassium tablet as potassium level has improved on Spironolactone and had difficulty with potassium pill size. I anticipate management of her volume status is going to be limited by relative hypotension. If hypotension persists, may  need to adjust her Torsemide to 20mg  AM and 20mg  in the afternoon to split dosing to avoid sudden BP drop.    Atrial fibrillation / Chronic anticoagulation - Plan for continued rate control. Continue present dose of Toprol 25 mg daily.  Continue Eliquis 5 mg twice daily. Denies bleeding complications. CHA2DS2-VASc Score = 6 [CHF History: 1, HTN History: 1, Diabetes History: 0, Stroke History: 0, Vascular Disease History: 1, Age Score: 2, Gender Score: 1].  Therefore, the patient's annual risk of stroke is 9.7 %.    Does not meet dose reduction criteria.  If she has recurrent falls we will need to revisit the risk versus benefit of anticoagulation.  Not meet this reduction criteria.   Chronic calcification on CT/Aortic atherosclerosis/HLD, LDL goal <70 - No anginal symptoms. No indication for ischemic evaluation.  02/2023 LDL 88. Improved from labs 6 mos prior with LDL 105. Continue Atorvastatin 40mg  daily and lifestyle changes.    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 Continue optimal BP control and Toprol. Recommend avoidance of fluoroquinolones.  Consider repeat CTA vs MRA for monitoring 10/2023.  Can coordinate at follow up.            Dispo: follow up in 2-3 mos  Signed, Clearnce Curia, NP

## 2023-05-12 DIAGNOSIS — J449 Chronic obstructive pulmonary disease, unspecified: Secondary | ICD-10-CM | POA: Diagnosis not present

## 2023-05-12 DIAGNOSIS — R0602 Shortness of breath: Secondary | ICD-10-CM | POA: Diagnosis not present

## 2023-05-14 ENCOUNTER — Telehealth: Payer: Self-pay

## 2023-05-14 NOTE — Telephone Encounter (Signed)
 Pt. Is asking from something for pain, her x-ray has not been read yet. I will call to see if they can read it today.   Copied from CRM [id number]. Topic: Clinical - Request for Lab/Test Order >> May 04, 2023  4:25 PM Everette C wrote: Reason for CRM: The patient shares that they fell roughly two nights ago and they were seen in the ED on 05/02/23 and discharged to go home   The patient's daughter has called to request imaging orders for the patient's lower back to assure that there are no major issues >> May 14, 2023  9:58 AM Allyne Areola wrote: Patient is calling because she had x-rays done on 05/08/2023 of her lower back due to a fall. She is experiencing pain especially when she sits and takes tylenol ; however, that doesn't help. She would like to know if the results are in from the x-ray and if there is any prescription she can take to help with the pain.

## 2023-05-15 ENCOUNTER — Other Ambulatory Visit: Payer: Self-pay | Admitting: Nurse Practitioner

## 2023-05-15 DIAGNOSIS — M545 Low back pain, unspecified: Secondary | ICD-10-CM

## 2023-05-15 MED ORDER — PREDNISONE 20 MG PO TABS: 40.0000 mg | ORAL_TABLET | Freq: Every day | ORAL | 0 refills | Status: DC

## 2023-05-15 MED ORDER — GABAPENTIN 100 MG PO CAPS: 100.0000 mg | ORAL_CAPSULE | Freq: Two times a day (BID) | ORAL | 0 refills | Status: DC

## 2023-05-17 ENCOUNTER — Telehealth: Payer: Self-pay | Admitting: *Deleted

## 2023-05-17 DIAGNOSIS — R5381 Other malaise: Secondary | ICD-10-CM

## 2023-05-17 DIAGNOSIS — F411 Generalized anxiety disorder: Secondary | ICD-10-CM

## 2023-05-17 NOTE — Progress Notes (Signed)
 Complex Care Management Care Guide Note  05/17/2023 Name: Amberleigh Gerken MRN: [id number] DOB: [birth year]  Geni Skorupski is a 83 y.o. year old female who is a primary care patient of Early, Adriane Albe, NP and is actively engaged with the care management team. I reached out to Sinclair Due by phone today to assist with re-scheduling  with the RN Case Manager Licensed Clinical Social Worker.  Follow up plan: Telephone appointment with complex care management team member scheduled for:  4/28 with LCSW RNCM to call   Barnie Bora  [facility], [facility] Guide  Direct Dial: [phone]  Fax [phone]

## 2023-05-21 ENCOUNTER — Other Ambulatory Visit: Payer: Self-pay | Admitting: Licensed Clinical Social Worker

## 2023-05-21 NOTE — Patient Outreach (Signed)
 Complex Care Management   Visit Note  05/21/2023  Name:  Lindsay [name] MRN: [id number] DOB: 1940/02/24  Situation: Referral received for Complex Care Management related to Menta/Behavioral Health diagnosis MDD  I obtained verbal consent from Patient.  Visit completed with Daughter  on the phone  Background:   Past Medical History:  Diagnosis Date   Acute diastolic CHF (congestive heart failure) (HCC) 08/03/2020   Acute non-recurrent pansinusitis 10/21/2020   Acute on chronic respiratory failure with hypoxia (HCC) 08/02/2020   Asthma    Asthma exacerbation 08/02/2020   Atrial fibrillation (HCC)    Dysphagia 08/02/2020   Dyspnea 05/09/2021   Elevated troponin 09/14/2020   High cholesterol    HTN (hypertension)    Hyperkalemia 09/14/2020   Hyponatremia 08/02/2020   Low grade squamous intraepithelial lesion on cytologic smear of cervix (LGSIL) 12/02/2019   Moderate persistent asthma without complication 12/02/2019   Non-healing wound of right lower extremity 08/01/2022   Respiratory failure with hypoxia (HCC)    Sore throat 11/16/2020    Assessment: Patient Reported Symptoms:  Cognitive Cognitive Status: Alert and oriented to person, place, and time, Normal speech and language skills Cognitive/Intellectual Conditions Management [RPT]: None reported or documented in medical history or problem list   Health Maintenance Behaviors: Annual physical exam, Stress management  Neurological      HEENT        Cardiovascular Cardiovascular Symptoms Reported: Fatigue Does patient have uncontrolled Hypertension?: No Cardiovascular Conditions: Heart failure Cardiovascular Management Strategies: Weight management Do You Have a Working Readable Scale?: Yes Cardiovascular Self-Management Outcome: 3 (uncertain)  Respiratory Respiratory Symptoms Reported: Shortness of breath Additional Respiratory Details: Uses O2, short of breath on exertion, does breathing treatments at home.    Endocrine  Patient reports the following symptoms related to hypoglycemia or hyperglycemia : No symptoms reported Is patient diabetic?: No (Dtr reports pre-diabetes)    Gastrointestinal Gastrointestinal Symptoms Reported: No symptoms reported      Genitourinary Genitourinary Symptoms Reported: No symptoms reported    Integumentary Integumentary Symptoms Reported: No symptoms reported    Musculoskeletal Musculoskelatal Symptoms Reviewed: Unsteady gait, Weakness, Difficulty walking Additional Musculoskeletal Details: Will have scan completed this friday to rule out further damage - will determine if she can particpate in PT/OT Musculoskeletal Conditions: Back pain Musculoskeletal Management Strategies: Coping strategies, Medical device, Medication therapy Musculoskeletal Self-Management Outcome: 3 (uncertain)      Psychosocial Psychosocial Symptoms Reported: Depression - if selected complete PHQ 2-9, Sadness - if selected complete PHQ 2-9, Anxiety - if selected complete GAD Behavioral Health Conditions: Anxiety, Depression Behavioral Management Strategies: Coping strategies, Counseling, Medication therapy Behavioral Health Self-Management Outcome: 3 (uncertain) Major Change/Loss/Stressor/Fears (CP): Medical condition, self Quality of Family Relationships: helpful, involved, supportive Do you feel physically threatened by others?: No      03/22/2023   11:34 AM  Depression screen PHQ 2/9  Decreased Interest 2  Down, Depressed, Hopeless 1  PHQ - 2 Score 3  Altered sleeping 0  Tired, decreased energy 2  Change in appetite 0  Feeling bad or failure about yourself  0  Trouble concentrating 1  Moving slowly or fidgety/restless 0  Suicidal thoughts 0  PHQ-9 Score 6  Difficult doing work/chores Not difficult at all    There were no vitals filed for this visit.  Medications Reviewed Today   Medications were not reviewed in this encounter     Recommendation:   Complete initial appt with CSW  on 05/24/2023  Follow Up Plan:   Telephone follow  up appointment date/time:  05/24/2023 - 1:30PM  Hale Level, LCSW [place], [facility] Licensed Clinical Social Worker Care Coordinator [phone]

## 2023-05-24 ENCOUNTER — Encounter: Payer: Self-pay | Admitting: Physical Medicine and Rehabilitation

## 2023-05-24 ENCOUNTER — Encounter: Payer: Self-pay | Admitting: Licensed Clinical Social Worker

## 2023-05-24 ENCOUNTER — Telehealth: Payer: Self-pay | Admitting: Licensed Clinical Social Worker

## 2023-05-24 ENCOUNTER — Ambulatory Visit: Admitting: Physical Medicine and Rehabilitation

## 2023-05-24 ENCOUNTER — Other Ambulatory Visit (INDEPENDENT_AMBULATORY_CARE_PROVIDER_SITE_OTHER): Payer: Self-pay

## 2023-05-24 DIAGNOSIS — W19XXXA Unspecified fall, initial encounter: Secondary | ICD-10-CM

## 2023-05-24 DIAGNOSIS — M533 Sacrococcygeal disorders, not elsewhere classified: Secondary | ICD-10-CM

## 2023-05-24 NOTE — Progress Notes (Signed)
 Lindsay [name] - 83 y.o. female MRN [id number]  Date of birth: 1940/05/06  Office Visit Note: Visit Date: 05/24/2023 PCP: Annella Kief, NP Referred by: Annella Kief, NP  Subjective: Chief Complaint  Patient presents with   Spine - Pain   HPI: Lindsay [name] is a 83 y.o. female who comes in today per the request of Archibald Beard, NP for evaluation of acute coccyx pain.  Patient is somewhat of a poor historian, her daughter is accompanying her during office visit today.  Patient currently resides at [facility] in assisted living facility. She fell in kitchen about 3 weeks ago. Daughter states this was unwitnessed fall, she was found laying on ground by staff, facet down. Now reports severe pain to bilateral coccyx region, becomes worse with sitting. States she is unable to sit for long periods. She describes pain as sore and aching sensation, currently rates as 8 out of 10. She was seen in the emergency room post fall, chest x-ray, CT head, cervical spine and maxillofacial scans show no evidence of acute process or fracture. She is currently taking Eliquis , bruising noted to left side of face from fall. She does have history of osteoporosis with multiple thoracic and lumbar compression fractures. Currently taking Fosamax . Patient is currently using wheelchair to assist with ambulation. She is able to stand today and bear weight with assistance. She denies focal weakness, numbness and tingling. No real complaints of lower back pain today.      Review of Systems  Musculoskeletal:  Positive for joint pain and myalgias.  Neurological:  Negative for tingling, sensory change, focal weakness and weakness.  All other systems reviewed and are negative.  Otherwise per HPI.  Assessment & Plan: Visit Diagnoses:    ICD-10-CM   1. Fall, initial encounter  W19.XXXA XR Pelvis 1-2 Views    CT PELVIS WO CONTRAST    2. Coccyx pain  M53.3 XR Pelvis 1-2 Views    CT PELVIS WO CONTRAST    3. Coccyodynia  M53.3  CT PELVIS WO CONTRAST       Plan: Findings:  Acute coccyx pain post mechanical fall. She continues to have severe pain despite good conservative therapy such as rest and use of medications.  Patient's clinical presentation and exam are consistent with coccydynia.  She does have tenderness upon palpation of coccygeal region today.  No real pain to bilateral ischial tuberosity.  No groin pain.  She is able to stand and bear weight with assistance. I obtained 2 view pelvic x-rays in the office today that show no evidence of acute fracture.  There is significant joint space narrowing and arthritic changes to the left hip, widening and sclerosis noted to bilateral SI joints.  We discussed treatment plan in detail today and I placed order for CT of pelvis to rule out any possible sacral insufficiency fractures.  I would like to see patient back to review CT imaging and discuss further treatments.  She can continue with 1000 mg of Tylenol  3 times daily, would also like for her to avoid sitting for prolonged peers of time, would be ideal if she could get up and walk for short distances.  Also discussed with her use of a doughnut pillow while she is sitting.  She can continue with current medication regimen.  We will discuss regrouping with formal physical therapy post CT imaging.  No red flag symptoms noted upon exam today.    Meds & Orders: No orders of the defined types  were placed in this encounter.   Orders Placed This Encounter  Procedures   XR Pelvis 1-2 Views   CT PELVIS WO CONTRAST    Follow-up: Return for review of pelvic CT imaging.   Procedures: No procedures performed      Clinical History: No specialty comments available.   She reports that she has quit smoking. Her smoking use included cigarettes. She has never used smokeless tobacco.  Recent Labs    11/06/22 0000 03/22/23 1232  HGBA1C 6.0* 5.9*    Objective:  VS:  HT:    WT:   BMI:     BP:   HR: bpm  TEMP: ( )  RESP:   Physical Exam Vitals and nursing note reviewed.  HENT:     Head: Normocephalic and atraumatic.     Right Ear: External ear normal.     Left Ear: External ear normal.     Nose: Nose normal.     Mouth/Throat:     Mouth: Mucous membranes are moist.  Eyes:     Extraocular Movements: Extraocular movements intact.  Cardiovascular:     Rate and Rhythm: Normal rate.     Pulses: Normal pulses.  Pulmonary:     Effort: Pulmonary effort is normal.  Abdominal:     General: Abdomen is flat. There is no distension.  Musculoskeletal:        General: Tenderness present.     Cervical back: Normal range of motion.     Comments: Patient is slow to rise from sitting to standing position. She is able to bear weight with assistance. Pain noted upon palpation of coccygeal region. No pain noted with internal/external rotation of bilateral hips.   Skin:    General: Skin is warm and dry.     Capillary Refill: Capillary refill takes less than 2 seconds.  Neurological:     General: No focal deficit present.     Mental Status: She is alert and oriented to person, place, and time.  Psychiatric:        Mood and Affect: Mood normal.        Behavior: Behavior normal.     Ortho Exam  Imaging: No results found.   Past Medical/Family/Surgical/Social History: Medications & Allergies reviewed per EMR, new medications updated. 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   Atherosclerosis of native arteries of extremities with rest pain, bilateral legs (HCC) 09/18/2022   Hypothyroidism 09/18/2022   Insomnia  09/18/2022   Non-toxic multinodular goiter 09/18/2022   Occlusion and stenosis of bilateral carotid arteries 09/18/2022   Other vitamin B12 deficiency anemias 09/18/2022   Unspecified mononeuropathy of right lower limb 09/18/2022   Presbycusis of both ears 07/06/2022   Sebaceous cyst 05/26/2022   Bilateral hearing loss 05/11/2022   Hypertension associated with diabetes (HCC) 05/11/2021   GERD (gastroesophageal reflux disease) 05/09/2021   Hyperlipidemia associated with type 2 diabetes mellitus (HCC) 05/09/2021   Elevated fasting blood sugar 05/09/2021   Bilateral lower extremity edema 03/09/2021   Physical deconditioning 02/18/2021   Generalized anxiety disorder 10/21/2020   Urinary incontinence in female 09/28/2020  Chronic CHF (congestive heart failure) (HCC) 09/14/2020   Achalasia of esophagus 08/06/2020   Acute on chronic diastolic CHF (congestive heart failure) (HCC) 08/03/2020   Chronic respiratory failure with hypoxia (HCC) 08/02/2020   Hypokalemia 08/02/2020   Chronic anticoagulation 08/02/2020   Acquired thrombophilia (HCC) 08/02/2020   Persistent atrial fibrillation (HCC) 08/02/2020   Age-related osteoporosis without current pathological fracture 01/15/2020   History of compression fracture of spine 12/02/2019   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   Morbid obesity with body mass index (BMI) of 40.0 or higher (HCC) 12/02/2019   Oxygen  dependent 12/02/2019   Recurrent major depressive disorder, in partial remission (HCC) 12/02/2019   Vitamin D  deficiency 12/02/2019   Second degree atrioventricular block 03/17/2014   Chronic obstructive pulmonary disease (HCC) 11/11/2012   Mitral valve stenosis 06/13/2004   Past Medical History:  Diagnosis Date   Acute diastolic CHF (congestive heart failure) (HCC) 08/03/2020   Acute non-recurrent pansinusitis 10/21/2020   Acute on chronic respiratory failure with hypoxia (HCC) 08/02/2020   Asthma     Asthma exacerbation 08/02/2020   Atrial fibrillation (HCC)    Dysphagia 08/02/2020   Dyspnea 05/09/2021   Elevated troponin 09/14/2020   High cholesterol    HTN (hypertension)    Hyperkalemia 09/14/2020   Hyponatremia 08/02/2020   Low grade squamous intraepithelial lesion on cytologic smear of cervix (LGSIL) 12/02/2019   Moderate persistent asthma without complication 12/02/2019   Non-healing wound of right lower extremity 08/01/2022   Respiratory failure with hypoxia (HCC)    Sore throat 11/16/2020   Family History  Problem Relation Age of Onset   Breast cancer Sister    Hypertension Other    Past Surgical History:  Procedure Laterality Date   CARDIOVERSION N/A 09/13/2020   Procedure: CARDIOVERSION;  Surgeon: Euell Herrlich, MD;  Location: [facility] ENDOSCOPY;  Service: Cardiovascular;  Laterality: N/A;   ESOPHAGOGASTRODUODENOSCOPY N/A 08/05/2020   Procedure: ESOPHAGOGASTRODUODENOSCOPY (EGD);  Surgeon: Felecia Hopper, MD;  Location: [facility] ENDOSCOPY;  Service: Gastroenterology;  Laterality: N/A;   Social History   Occupational History   Not on file  Tobacco Use   Smoking status: Former    Types: Cigarettes   Smokeless tobacco: Never   Tobacco comments:    Quit remotely 1970s  Vaping Use   Vaping status: Never Used  Substance and Sexual Activity   Alcohol use: Not Currently   Drug use: Never   Sexual activity: Not Currently

## 2023-05-24 NOTE — Progress Notes (Signed)
 Pain Scale   Average Pain 7 Patient advising she had a fall and doesn't remember falling/ patient family member advising patient has history of spinal fractures and she is concerned patient may have injured her back during the fall. Patient did have an MRI per family but is unaware or findings.        +Driver, -BT, -Dye Allergies.

## 2023-05-25 ENCOUNTER — Ambulatory Visit
Admission: RE | Admit: 2023-05-25 | Discharge: 2023-05-25 | Disposition: A | Discharge status: Home / Self Care | Source: Ambulatory Visit | Attending: Physical Medicine and Rehabilitation | Admitting: Physical Medicine and Rehabilitation

## 2023-05-25 DIAGNOSIS — W19XXXA Unspecified fall, initial encounter: Secondary | ICD-10-CM

## 2023-05-25 DIAGNOSIS — K5730 Diverticulosis of large intestine without perforation or abscess without bleeding: Secondary | ICD-10-CM | POA: Diagnosis not present

## 2023-05-25 DIAGNOSIS — M533 Sacrococcygeal disorders, not elsewhere classified: Secondary | ICD-10-CM | POA: Diagnosis not present

## 2023-05-28 ENCOUNTER — Telehealth: Payer: Self-pay | Admitting: Physical Medicine and Rehabilitation

## 2023-05-28 NOTE — Telephone Encounter (Signed)
 I called patient's daughter is morning to discuss recent CT of pelvis. I left message for her to call me back. No acute fractures noted on imaging, would recommend patient start with physical therapy.

## 2023-05-29 ENCOUNTER — Encounter (HOSPITAL_BASED_OUTPATIENT_CLINIC_OR_DEPARTMENT_OTHER): Payer: Self-pay

## 2023-05-29 DIAGNOSIS — I5032 Chronic diastolic (congestive) heart failure: Secondary | ICD-10-CM

## 2023-05-30 ENCOUNTER — Telehealth: Payer: Self-pay | Admitting: Physical Medicine and Rehabilitation

## 2023-05-30 NOTE — Telephone Encounter (Signed)
 Please review and advise.

## 2023-05-30 NOTE — Telephone Encounter (Signed)
 Spoke with patient's daughter Loetta Ringer this afternoon, recent CT of pelvis is negative for acute fractures or abnormalities. Recommend patient start with PT at facility. I will leave paper order for PT at front desk for pick up. We are happy to see patient back as needed. Daughter has no questions at this time.

## 2023-05-31 NOTE — Telephone Encounter (Signed)
 Torsemide  40mg  BID x 3 days. Then return to 40mg  daily. BMET/BNP on Monday.   Any recent weight readings or BP readings would be helpful.  Rico Massar S Tensley Wery, NP

## 2023-06-01 ENCOUNTER — Other Ambulatory Visit: Payer: Self-pay | Admitting: Licensed Clinical Social Worker

## 2023-06-01 ENCOUNTER — Other Ambulatory Visit: Payer: Self-pay | Admitting: Nurse Practitioner

## 2023-06-01 ENCOUNTER — Encounter: Payer: Self-pay | Admitting: Nurse Practitioner

## 2023-06-01 DIAGNOSIS — F3341 Major depressive disorder, recurrent, in partial remission: Secondary | ICD-10-CM

## 2023-06-01 DIAGNOSIS — F411 Generalized anxiety disorder: Secondary | ICD-10-CM

## 2023-06-01 MED ORDER — SERTRALINE HCL 100 MG PO TABS
150.0000 mg | ORAL_TABLET | Freq: Every day | ORAL | 3 refills | Status: DC
Start: 2023-06-01 — End: 2023-06-29

## 2023-06-01 NOTE — Patient Instructions (Signed)
 Visit Information  Thank you for taking time to visit with me today. Please don't hesitate to contact me if I can be of assistance to you before our next scheduled appointment.  Your next care management appointment is by telephone on 07/02/2023.    Please call the care guide team at [phone] if you need to cancel, schedule, or reschedule an appointment.   Please call the Suicide and Crisis Lifeline: 988 if you are experiencing a Mental Health or Behavioral Health Crisis or need someone to talk to.  Hale Level, LCSW [place], [facility] Licensed Clinical Social Worker Care Coordinator [phone]

## 2023-06-01 NOTE — Patient Outreach (Signed)
 Complex Care Management   Visit Note  06/01/2023  Name:  Lindsay [name] MRN: [id number] DOB: 1940/07/05  Situation: Referral received for Complex Care Management related to Menta/Behavioral Health diagnosis MDD I obtained verbal consent from Patient.  Visit completed with patient  on the phone  Background:   Past Medical History:  Diagnosis Date   Acute diastolic CHF (congestive heart failure) (HCC) 08/03/2020   Acute non-recurrent pansinusitis 10/21/2020   Acute on chronic respiratory failure with hypoxia (HCC) 08/02/2020   Asthma    Asthma exacerbation 08/02/2020   Atrial fibrillation (HCC)    Dysphagia 08/02/2020   Dyspnea 05/09/2021   Elevated troponin 09/14/2020   High cholesterol    HTN (hypertension)    Hyperkalemia 09/14/2020   Hyponatremia 08/02/2020   Low grade squamous intraepithelial lesion on cytologic smear of cervix (LGSIL) 12/02/2019   Moderate persistent asthma without complication 12/02/2019   Non-healing wound of right lower extremity 08/01/2022   Respiratory failure with hypoxia (HCC)    Sore throat 11/16/2020    Assessment: Patient Reported Symptoms:  Cognitive Cognitive Status: Alert and oriented to person, place, and time, Insightful and able to interpret abstract concepts, Normal speech and language skills Cognitive/Intellectual Conditions Management [RPT]: None reported or documented in medical history or problem list   Health Maintenance Behaviors: Annual physical exam, Stress management Health Facilitated by: Stress management  Neurological Neurological Review of Symptoms: No symptoms reported Neurological Management Strategies: Counseling, Medication therapy  HEENT HEENT Symptoms Reported: No symptoms reported      Cardiovascular Cardiovascular Symptoms Reported: Fatigue Does patient have uncontrolled Hypertension?: No Cardiovascular Conditions: Heart failure Cardiovascular Management Strategies: Coping strategies, Medication therapy, Weight  management Cardiovascular Self-Management Outcome: 3 (uncertain)  Respiratory Respiratory Symptoms Reported: Shortness of breath Additional Respiratory Details: O2 treatment, breathing treatment, fatigued Respiratory Conditions: COPD Respiratory Self-Management Outcome: 3 (uncertain)  Endocrine Patient reports the following symptoms related to hypoglycemia or hyperglycemia : No symptoms reported Is patient diabetic?: No    Gastrointestinal Gastrointestinal Symptoms Reported: No symptoms reported      Genitourinary Genitourinary Symptoms Reported: No symptoms reported    Integumentary Integumentary Symptoms Reported: No symptoms reported    Musculoskeletal Musculoskelatal Symptoms Reviewed: Weakness, Unsteady gait Musculoskeletal Conditions: Back pain Musculoskeletal Management Strategies: Coping strategies, Medical device, Medication therapy Musculoskeletal Self-Management Outcome: 3 (uncertain) Falls in the past year?: Yes Number of falls in past year: 1 or less Was there an injury with Fall?: Yes Fall Risk Category Calculator: 2 Patient Fall Risk Level: Moderate Fall Risk Patient at Risk for Falls Due to: History of fall(s) Fall risk Follow up: Falls prevention discussed  Psychosocial Psychosocial Symptoms Reported: Anxiety - if selected complete GAD, Depression - if selected complete PHQ 2-9 Behavioral Health Conditions: Anxiety, Dementia Behavioral Management Strategies: Counseling, Medication therapy, Support system, Activity, Coping strategies Behavioral Health Self-Management Outcome: 3 (uncertain) Major Change/Loss/Stressor/Fears (CP): Medical condition, self Techniques to Cope with Loss/Stress/Change: Counseling Quality of Family Relationships: helpful, involved, supportive Do you feel physically threatened by others?: No      06/01/2023    9:22 AM  Depression screen PHQ 2/9  Decreased Interest 1  Down, Depressed, Hopeless 1  PHQ - 2 Score 2  Altered sleeping 0   Tired, decreased energy 2  Change in appetite 0  Feeling bad or failure about yourself  2  Trouble concentrating 0  Moving slowly or fidgety/restless 0  Suicidal thoughts 0  PHQ-9 Score 6  Difficult doing work/chores Somewhat difficult    There were no vitals  filed for this visit.  Medications Reviewed Today   Medications were not reviewed in this encounter     Recommendation:   Continue to take medications as prescribed. Monitor for symptoms and utilize coping skills as needed.  Follow Up Plan:   Telephone follow up appointment date/time:  07/02/2023  Hale Level, LCSW [place], [facility] Licensed Clinical Social Worker Care Coordinator [phone]

## 2023-06-04 DIAGNOSIS — I5032 Chronic diastolic (congestive) heart failure: Secondary | ICD-10-CM | POA: Diagnosis not present

## 2023-06-05 ENCOUNTER — Ambulatory Visit (HOSPITAL_BASED_OUTPATIENT_CLINIC_OR_DEPARTMENT_OTHER): Payer: Self-pay | Admitting: Family

## 2023-06-05 LAB — BASIC METABOLIC PANEL WITH GFR
BUN/Creatinine Ratio: 16 (ref 12–28)
BUN: 17 mg/dL (ref 8–27)
CO2: 27 mmol/L (ref 20–29)
Calcium: 9.4 mg/dL (ref 8.7–10.3)
Chloride: 97 mmol/L (ref 96–106)
Creatinine, Ser: 1.09 mg/dL — ABNORMAL HIGH (ref 0.57–1.00)
Glucose: 99 mg/dL (ref 70–99)
Potassium: 3.7 mmol/L (ref 3.5–5.2)
Sodium: 142 mmol/L (ref 134–144)
eGFR: 51 mL/min/{1.73_m2} — ABNORMAL LOW (ref >59)

## 2023-06-05 LAB — BRAIN NATRIURETIC PEPTIDE: BNP: 165.7 pg/mL — ABNORMAL HIGH (ref 0.0–100.0)

## 2023-06-08 ENCOUNTER — Encounter (HOSPITAL_BASED_OUTPATIENT_CLINIC_OR_DEPARTMENT_OTHER): Payer: Self-pay

## 2023-06-08 NOTE — Telephone Encounter (Signed)
 Update as requested

## 2023-06-11 DIAGNOSIS — J449 Chronic obstructive pulmonary disease, unspecified: Secondary | ICD-10-CM | POA: Diagnosis not present

## 2023-06-11 DIAGNOSIS — R0602 Shortness of breath: Secondary | ICD-10-CM | POA: Diagnosis not present

## 2023-06-20 ENCOUNTER — Other Ambulatory Visit (HOSPITAL_BASED_OUTPATIENT_CLINIC_OR_DEPARTMENT_OTHER): Payer: Self-pay | Admitting: Nurse Practitioner

## 2023-06-20 DIAGNOSIS — F411 Generalized anxiety disorder: Secondary | ICD-10-CM

## 2023-06-20 DIAGNOSIS — F3341 Major depressive disorder, recurrent, in partial remission: Secondary | ICD-10-CM

## 2023-06-27 ENCOUNTER — Other Ambulatory Visit (HOSPITAL_BASED_OUTPATIENT_CLINIC_OR_DEPARTMENT_OTHER): Payer: Self-pay | Admitting: Nurse Practitioner

## 2023-06-27 ENCOUNTER — Ambulatory Visit: Payer: Self-pay

## 2023-06-27 DIAGNOSIS — F411 Generalized anxiety disorder: Secondary | ICD-10-CM

## 2023-06-27 NOTE — Telephone Encounter (Signed)
 FYI Only or Action Required?: FYI only for provider  Patient was last seen in primary care on 04/26/2023 by Claudene Crystal, [name]. Called Nurse Triage reporting Shortness of Breath. Symptoms began several days ago. Interventions attempted: Prescription medications: scheduled meds, home oxygen , rest. Symptoms are: unchanged.  Triage Disposition: See HCP Within 4 Hours (Or PCP Triage) Patient scheduled for 06/28/2023. Will Proceed to ED if symptoms worsen.   Patient/caregiver understands and will follow disposition?: Yes        Copied from CRM [id number]. Topic: Clinical - Red Word Triage >> Jun 27, 2023  3:40 PM Everette C wrote: [facility] that prompted transfer to Nurse Triage: The patient shares that they have experienced stomach discomfort and shortness of breath for roughly three days Reason for Disposition  [1] Longstanding difficulty breathing (e.g., CHF, COPD, emphysema) AND [2] WORSE than normal  Answer Assessment - Initial Assessment Questions 1. RESPIRATORY STATUS: "Describe your breathing?" (e.g., wheezing, shortness of breath, unable to speak, severe coughing)      Shortness of breath worsening x 1 day, audible wheezing through phone 2. ONSET: "When did this breathing problem begin?"     Three days ago 3. PATTERN "Does the difficult breathing come and go, or has it been constant since it started?"      Improves with sitting, worsens with activity 4. SEVERITY: "How bad is your breathing?" (e.g., mild, moderate, severe)    - MILD: No SOB at rest, mild SOB with walking, speaks normally in sentences, can lie down, no retractions, pulse < 100.    - MODERATE: SOB at rest, SOB with minimal exertion and prefers to sit, cannot lie down flat, speaks in phrases, mild retractions, audible wheezing, pulse 100-120.    - SEVERE: Very SOB at rest, speaks in single words, struggling to breathe, sitting hunched forward, retractions, pulse > 120      moderate 5. RECURRENT SYMPTOM: "Have you had  difficulty breathing before?" If Yes, ask: "When was the last time?" and "What happened that time?"      Yes, uses oxygen , sinus drainage  LUNG HISTORY: "Do you have any history of lung disease?"  (e.g., pulmonary embolus, asthma, emphysema)     CHF, Afib, COPD 8. CAUSE: "What do you think is causing the breathing problem?"      Unsure, does have sinus drainage 9. OTHER SYMPTOMS: "Do you have any other symptoms? (e.g., dizziness, runny nose, cough, chest pain, fever)     Sinus drainage, mild cough but not really coughing anything up 10. O2 SATURATION MONITOR:  "Do you use an oxygen  saturation monitor (pulse oximeter) at home?" If Yes, ask: "What is your reading (oxygen  level) today?" "What is your usual oxygen  saturation reading?" (e.g., 95%)       93%, HR 78, sats normally run high 80's to low 90's  Protocols used: Breathing Difficulty-A-AH

## 2023-06-28 ENCOUNTER — Ambulatory Visit: Admitting: Family Medicine

## 2023-06-28 ENCOUNTER — Ambulatory Visit: Payer: Self-pay

## 2023-06-28 NOTE — Telephone Encounter (Signed)
 Last apt 03/22/23

## 2023-06-28 NOTE — Telephone Encounter (Signed)
 FYI Only or Action Required?: FYI only for provider  Patient was last seen in primary care on 04/26/2023 by Claudene Crystal, [name]. Called Nurse Triage reporting Advice Only. Symptoms began several days ago. Interventions attempted: Rest, hydration, or home remedies and Other: home O2. Symptoms are: abdominal pain, SOBunchanged.  Triage Disposition: Call PCP When Office is Open  Patient/caregiver understands and will follow disposition?: Yes            Copied from CRM [id number]. Topic: Clinical - Red Word Triage >> Jun 28, 2023  2:20 PM Zipporah Him wrote: Red Word that prompted transfer to Nurse Triage: The patient states she is experiencing stomach discomfort and shortness of breath still on going from triage from yesterday, was scheduled by e2c2 triage but wondering if she needs to be seen sooner. Reason for Disposition  Requesting regular office appointment  Answer Assessment - Initial Assessment Questions 1. REASON FOR CALL or QUESTION: "What is your reason for calling today?" or "How can I best help you?" or "What question do you have that I can help answer?"     Patient calling, triaged within the past 24 hours and states no new or worsening symptoms. She states she was unable to make her appointment this afternoon due to transportation and was wondering if she can be seen tomorrow. For E2C2 RN, soonest available appointments are not until Monday. Patient transferred to [facility] as they have available appointments tomorrow to schedule patient.  Protocols used: Information Only Call - No Triage-A-AH

## 2023-06-29 ENCOUNTER — Ambulatory Visit: Admitting: Medical

## 2023-06-29 ENCOUNTER — Other Ambulatory Visit (HOSPITAL_BASED_OUTPATIENT_CLINIC_OR_DEPARTMENT_OTHER): Payer: Self-pay | Admitting: Nurse Practitioner

## 2023-06-29 DIAGNOSIS — F411 Generalized anxiety disorder: Secondary | ICD-10-CM

## 2023-06-29 DIAGNOSIS — F3341 Major depressive disorder, recurrent, in partial remission: Secondary | ICD-10-CM

## 2023-06-29 NOTE — Telephone Encounter (Signed)
 Has an appt with Dr. Monnie Anthony Monday 6/9 will put in visit notes to schedule follow-up with [facility]

## 2023-07-02 ENCOUNTER — Encounter: Payer: Self-pay | Admitting: Nurse Practitioner

## 2023-07-02 ENCOUNTER — Ambulatory Visit: Admitting: Nurse Practitioner

## 2023-07-02 VITALS — BP 122/70 | HR 80

## 2023-07-02 DIAGNOSIS — R1310 Dysphagia, unspecified: Secondary | ICD-10-CM

## 2023-07-02 DIAGNOSIS — Z79899 Other long term (current) drug therapy: Secondary | ICD-10-CM

## 2023-07-02 DIAGNOSIS — R09A2 Foreign body sensation, throat: Secondary | ICD-10-CM

## 2023-07-02 DIAGNOSIS — R296 Repeated falls: Secondary | ICD-10-CM

## 2023-07-02 DIAGNOSIS — J9611 Chronic respiratory failure with hypoxia: Secondary | ICD-10-CM

## 2023-07-02 DIAGNOSIS — J441 Chronic obstructive pulmonary disease with (acute) exacerbation: Secondary | ICD-10-CM

## 2023-07-02 DIAGNOSIS — F3341 Major depressive disorder, recurrent, in partial remission: Secondary | ICD-10-CM

## 2023-07-02 DIAGNOSIS — I152 Hypertension secondary to endocrine disorders: Secondary | ICD-10-CM

## 2023-07-02 DIAGNOSIS — E1159 Type 2 diabetes mellitus with other circulatory complications: Secondary | ICD-10-CM

## 2023-07-02 DIAGNOSIS — F411 Generalized anxiety disorder: Secondary | ICD-10-CM

## 2023-07-02 LAB — LIPID PANEL

## 2023-07-02 NOTE — Patient Instructions (Addendum)
 I would like you to: - Restart sertraline  1 tablet at bedtime for 1 week. Then increase the (Zoloft ) sertraline  to 1.5 tablets at bedtime. - wait at least 2 weeks, if you are still having bad dreams or not feeling great then restart the mirtazipine. If you feel back to normal, you don't have to restart.   I will send in the orders for PT and Speech I sent the referral for the GI (stomach) doctor to check out the swallowing concern  I am going to work on making you a medication list to detail what all of your medications are for and when they need to be taken.   I will send in the medication assistance paperwork for you.

## 2023-07-02 NOTE — Progress Notes (Signed)
 Lindsay Fennel, DNP, AGNP-c [facility]  [address] [place], [place] [address] [phone]  ESTABLISHED PATIENT- Chronic Health and/or Follow-Up Visit  Blood pressure 122/70, pulse 80, SpO2 93%.   History of Present Illness Analy [name] is an 83 year old female with depression and heart issues who presents with fatigue and shortness of breath.  She has been experiencing extreme fatigue and shortness of breath for the past five to six days. She is unable to walk more than five feet to her recliner without feeling exhausted and needing to sit down. Significant shortness of breath occurs even when sitting, and she uses a 'plant flutter' device to help catch her breath. She also mentions occasional chest discomfort, described as a non-stabbing pain, and congestion in her throat.  She has a history of medication changes, including stopping torasemide and potassium on her own. She mistakenly stopped taking mirtazapine , thinking it was a sleeping pill, which she had been on for a long time. Since stopping, she has experienced increased nightmares and depression, feeling more isolated and not wanting to leave her room. Her medication regimen includes sertraline , which she has been without since last Thursday due to a pharmacy error. She expresses frustration with the pharmacy's blister pack system, which has led to confusion about her medication intake. She also takes lorazepam  for anxiety and has been on it for a long time.  She recalls a fall two months ago, after which she was advised to have a cardiac workup. She was cleared by cardiology but did not resume physical therapy. She describes a previous incident of blacking out, possibly due to low blood pressure, and notes that her blood pressure has been low in the past.  She mentions dietary concerns, noting that her meals are prepared for her and she has limited control over salt and sugar intake. She enjoys community dining but  has been less active socially due to her current health issues.    COPD- see pulmonology, last visit 02/28/2023, Dr. Diania Fortes.  CHF- Cardiology, last visit 05/08/2023, Lindsay [name]  All ROS negative with exception of what is listed above.   PHYSICAL EXAM Physical Exam Vitals and nursing note reviewed.  Constitutional:      General: She is not in acute distress.    Appearance: Normal appearance. She is not ill-appearing.  HENT:     Head: Normocephalic.   Eyes:     Conjunctiva/sclera: Conjunctivae normal.   Neck:     Vascular: No carotid bruit.   Cardiovascular:     Rate and Rhythm: Normal rate and regular rhythm.     Pulses: Normal pulses.     Heart sounds: Normal heart sounds.  Pulmonary:     Breath sounds: Wheezing and rhonchi present.  Abdominal:     Palpations: Abdomen is soft.   Musculoskeletal:     Cervical back: Neck supple. No tenderness.     Right lower leg: No edema.     Left lower leg: No edema.  Lymphadenopathy:     Cervical: No cervical adenopathy.   Skin:    General: Skin is warm and dry.     Capillary Refill: Capillary refill takes less than 2 seconds.   Neurological:     Mental Status: She is alert and oriented to person, place, and time.     Motor: Weakness present.     Coordination: Coordination abnormal.     Gait: Gait abnormal.   Psychiatric:        Mood and Affect: Mood  normal.        Behavior: Behavior normal.      PLAN Problem List Items Addressed This Visit     Chronic respiratory failure with hypoxia (HCC) - Primary   Lindsay [name] reports significant fatigue and dyspnea over the past five to six days, exacerbated by minimal exertion. Recent cessation of spironolactone  and potassium may contribute to these symptoms. She also reports chest discomfort and congestion, which have mostly resolved. - Check laboratory tests to assess for electrolyte imbalances and other abnormalities - Restart spironolactone  as previously prescribed - Monitor  symptoms and adjust medications as needed      Morbid obesity with body mass index (BMI) of 40.0 or higher (HCC)   Chronic morbid obesity in the setting of hypertension, hyperlipidemia, chronic heart failure, and COPD.  Unfortunately her respiratory and cardiac status limit her ability for physical activity.  Dietary recommendations of low calorie, low carbohydrate, low-fat diet are recommended.  Will continue to monitor closely.      Recurrent major depressive disorder, in partial remission (HCC)   Lindsay [name] has depression and anxiety, previously managed with sertraline  and mirtazapine . She stopped mirtazapine , which may have contributed to increased depression, anxiety, and nightmares. Mirtazapine  aids in mood stabilization, appetite, and sleep. Sudden cessation can lead to withdrawal symptoms, including nightmares. - Restart sertraline  at 100 mg at bedtime, increase to 150 mg after one week if tolerated - Monitor mood and anxiety symptoms - Consider restarting mirtazapine  if symptoms persist after two weeks on sertraline       Relevant Orders   CBC with Differential/Platelet (Completed)   Comprehensive metabolic panel with GFR (Completed)   Lipid panel (Completed)   AMB Referral VBCI Care Management   Hypertension associated with diabetes (HCC)   Lindsay [name] experienced a fall two months ago, possibly due to orthostatic hypotension. She reports hypotension and dizziness upon standing, potentially related to medication adjustments and deconditioning. Her blood pressure was low during an ER visit. - Encourage slow position changes to prevent dizziness - Monitor blood pressure regularly - Consider physical therapy to improve strength and balance      COPD with acute exacerbation (HCC)   Lindsay [name] reports significant fatigue and dyspnea over the past five to six days, exacerbated by minimal exertion. Recent cessation of spironolactone  and potassium may contribute to these symptoms. She also reports chest  discomfort and congestion, which have mostly resolved. - Check laboratory tests to assess for electrolyte imbalances and other abnormalities - Restart spironolactone  as previously prescribed - Monitor symptoms and adjust medications as needed      Globus sensation   Lindsay [name] experiences episodes of choking and dysphagia, particularly with large pills. A failed swallow test and recommendations for speech therapy were noted. She has been advised to use applesauce to aid in swallowing large pills. - Refer to gastroenterology for evaluation of potential esophageal stricture - Initiate speech therapy to improve swallowing mechanics      Relevant Orders   Ambulatory referral to Gastroenterology   Medication management   Lindsay [name] has experienced confusion with medication management, including incorrect blister pack contents and cessation of important medications, leading to exacerbation of symptoms and potential health risks. She has been without sertraline  since last Thursday due to a pharmacy error. Lindsay [name] resides in a community setting with access to meals and social activities. Limited physical activity and social engagement due to recent health issues are concerns. She has been encouraged to participate in community dining and social activities to improve her mood and physical health. -  Encourage participation in [employer] and social activities - Promote regular physical activity to prevent deconditioning - Review and reconcile medication list with the pharmacy - Create a clear medication schedule for Lindsay [name] - Consider alternative medication packaging options for clarity      Other Visit Diagnoses       GAD (generalized anxiety disorder)       Relevant Orders   CBC with Differential/Platelet (Completed)   Comprehensive metabolic panel with GFR (Completed)   Lipid panel (Completed)   AMB Referral VBCI Care Management     Dysphagia, unspecified type       Relevant Orders   Ambulatory  referral to Gastroenterology       Return in about 3 months (around 10/02/2023) for Med Management 45. Lindsay [name] requires follow-up to monitor her response to medication adjustments and therapy interventions. - Schedule follow-up appointment to review lab results and symptom progression - Coordinate with physical therapy and speech therapy services for ongoing care [name] Cherlynn Popiel, DNP, AGNP-c  Time: 58 minutes, >50% spent counseling, care coordination, chart review, and documentation.

## 2023-07-03 ENCOUNTER — Ambulatory Visit: Admitting: Family Medicine

## 2023-07-03 ENCOUNTER — Telehealth: Payer: Self-pay

## 2023-07-03 LAB — LIPID PANEL
Cholesterol, Total: 155 mg/dL (ref 100–199)
HDL: 58 mg/dL (ref >39)
LDL CALC COMMENT:: 2.7 ratio (ref 0.0–4.4)
LDL Chol Calc (NIH): 83 mg/dL (ref 0–99)
Triglycerides: 75 mg/dL (ref 0–149)
VLDL Cholesterol Cal: 14 mg/dL (ref 5–40)

## 2023-07-03 LAB — CBC WITH DIFFERENTIAL/PLATELET
Basophils Absolute: 0 10*3/uL (ref 0.0–0.2)
Basos: 1 %
EOS (ABSOLUTE): 0.1 10*3/uL (ref 0.0–0.4)
Eos: 1 %
Hematocrit: 35.8 % (ref 34.0–46.6)
Hemoglobin: 11.7 g/dL (ref 11.1–15.9)
Immature Grans (Abs): 0 10*3/uL (ref 0.0–0.1)
Immature Granulocytes: 0 %
Lymphocytes Absolute: 1.1 10*3/uL (ref 0.7–3.1)
Lymphs: 14 %
MCH: 28.9 pg (ref 26.6–33.0)
MCHC: 32.7 g/dL (ref 31.5–35.7)
MCV: 88 fL (ref 79–97)
Monocytes Absolute: 0.7 10*3/uL (ref 0.1–0.9)
Monocytes: 9 %
Neutrophils Absolute: 5.8 10*3/uL (ref 1.4–7.0)
Neutrophils: 75 %
Platelets: 268 10*3/uL (ref 150–450)
RBC: 4.05 x10E6/uL (ref 3.77–5.28)
RDW: 14.3 % (ref 11.7–15.4)
WBC: 7.6 10*3/uL (ref 3.4–10.8)

## 2023-07-03 LAB — COMPREHENSIVE METABOLIC PANEL WITH GFR
ALT: 14 IU/L (ref 0–32)
AST: 23 IU/L (ref 0–40)
Albumin: 4.1 g/dL (ref 3.7–4.7)
Alkaline Phosphatase: 96 IU/L (ref 44–121)
BUN/Creatinine Ratio: 16 (ref 12–28)
BUN: 19 mg/dL (ref 8–27)
Bilirubin Total: 0.4 mg/dL (ref 0.0–1.2)
CO2: 27 mmol/L (ref 20–29)
Calcium: 9.2 mg/dL (ref 8.7–10.3)
Chloride: 98 mmol/L (ref 96–106)
Creatinine, Ser: 1.2 mg/dL — ABNORMAL HIGH (ref 0.57–1.00)
Globulin, Total: 2.3 g/dL (ref 1.5–4.5)
Glucose: 83 mg/dL (ref 70–99)
Potassium: 4 mmol/L (ref 3.5–5.2)
Sodium: 142 mmol/L (ref 134–144)
Total Protein: 6.4 g/dL (ref 6.0–8.5)
eGFR: 45 mL/min/{1.73_m2} — ABNORMAL LOW (ref >59)

## 2023-07-03 NOTE — Progress Notes (Unsigned)
 Complex Care Management Note Care Guide Note  07/03/2023 Name: Lindsay [name] MRN: [id number] DOB: [birth year]   Complex Care Management Outreach Attempts: An unsuccessful telephone outreach was attempted today to offer the patient information about available complex care management services.  Follow Up Plan:  Additional outreach attempts will be made to offer the patient complex care management information and services.   Encounter Outcome:  No Answer  Creola Doheny [facility], [facility] Guide  Direct Dial: [phone]  Fax [phone]

## 2023-07-04 ENCOUNTER — Ambulatory Visit: Payer: Self-pay | Admitting: Nurse Practitioner

## 2023-07-04 NOTE — Progress Notes (Signed)
 Complex Care Management Note  Care Guide Note 07/04/2023 Name: Lindsay [name] MRN: [id number] DOB: 17-Apr-1940  Lindsay [name] is a 83 y.o. year old female who sees Early, Adriane Albe, NP for primary care. I reached out to Sinclair Due by phone today to offer complex care management services.  [name] was given information about Complex Care Management services today including:   The Complex Care Management services include support from the care team which includes your Nurse Care Manager, Clinical Social Worker, or Pharmacist.  The Complex Care Management team is here to help remove barriers to the health concerns and goals most important to you. Complex Care Management services are voluntary, and the patient may decline or stop services at any time by request to their care team member.   Complex Care Management Consent Status: Patient agreed to services and verbal consent obtained.   Follow up plan:  Telephone appointment with complex care management team member scheduled for:  07-05-23  Encounter Outcome:  Patient Scheduled  Creola Doheny [facility], [facility] Guide  Direct Dial: [phone]  Fax [phone]

## 2023-07-05 ENCOUNTER — Other Ambulatory Visit

## 2023-07-05 DIAGNOSIS — Z7901 Long term (current) use of anticoagulants: Secondary | ICD-10-CM

## 2023-07-05 NOTE — Progress Notes (Signed)
   07/05/2023  Patient ID: Lindsay [name], female   DOB: [birth year], 83 y.o.   MRN: [id number]  Contacted patient via telephone at referral of PCP to follow up on medication access and med management.  Patient reports she has been approved to receive trelegy PAP for 2025. Eliquis  application pending.  Follow up with patient in 1 week for full med review and check on status of eliquis  app.  Carnell Christian, PharmD Clinical Pharmacist [phone]

## 2023-07-06 ENCOUNTER — Other Ambulatory Visit: Payer: Self-pay | Admitting: Licensed Clinical Social Worker

## 2023-07-06 NOTE — Patient Outreach (Signed)
 Complex Care Management   Visit Note  07/06/2023  Name:  Lindsay [name] MRN: [id number] DOB: 1940/10/08  Situation: Referral received for Complex Care Management related to Mental/Behavioral Health diagnosis MDD I obtained verbal consent from Patient.  Visit completed with patient  on the phone  Background:   Past Medical History:  Diagnosis Date   Acute diastolic CHF (congestive heart failure) (HCC) 08/03/2020   Acute non-recurrent pansinusitis 10/21/2020   Acute on chronic respiratory failure with hypoxia (HCC) 08/02/2020   Asthma    Asthma exacerbation 08/02/2020   Atrial fibrillation (HCC)    Dysphagia 08/02/2020   Dyspnea 05/09/2021   Elevated troponin 09/14/2020   High cholesterol    HTN (hypertension)    Hyperkalemia 09/14/2020   Hyponatremia 08/02/2020   Low grade squamous intraepithelial lesion on cytologic smear of cervix (LGSIL) 12/02/2019   Moderate persistent asthma without complication 12/02/2019   Non-healing wound of right lower extremity 08/01/2022   Respiratory failure with hypoxia (HCC)    Sore throat 11/16/2020    Assessment: Patient Reported Symptoms:  Cognitive Cognitive Status: Alert and oriented to person, place, and time, Insightful and able to interpret abstract concepts, Normal speech and language skills Cognitive/Intellectual Conditions Management [RPT]: None reported or documented in medical history or problem list      Neurological Neurological Review of Symptoms: No symptoms reported    HEENT HEENT Symptoms Reported: No symptoms reported      Cardiovascular Cardiovascular Symptoms Reported: No symptoms reported    Respiratory Respiratory Symptoms Reported: No symptoms reported    Endocrine Patient reports the following symptoms related to hypoglycemia or hyperglycemia : No symptoms reported    Gastrointestinal Gastrointestinal Symptoms Reported: No symptoms reported      Genitourinary Genitourinary Symptoms Reported: No symptoms  reported    Integumentary Integumentary Symptoms Reported: No symptoms reported    Musculoskeletal Musculoskelatal Symptoms Reviewed: Difficulty walking Additional Musculoskeletal Details: Reports mobility has improved, feels more comfortable moving around her senior living facility and participating in activies.        Psychosocial Psychosocial Symptoms Reported: Anxiety - if selected complete GAD, Depression - if selected complete PHQ 2-9 Behavioral Health Conditions: Anxiety, Depression Behavioral Management Strategies: Activity, Coping strategies, Counseling, Medication therapy Behavioral Health Self-Management Outcome: 3 (uncertain) Behavioral Health Comment: Pt's Zoloft  prescription was adjusted by PCP Major Change/Loss/Stressor/Fears (CP): Medical condition, self Techniques to Cope with Loss/Stress/Change: Counseling Quality of Family Relationships: helpful, involved, supportive Do you feel physically threatened by others?: No      07/06/2023   10:25 AM  Depression screen PHQ 2/9  Decreased Interest 1  Down, Depressed, Hopeless 1  PHQ - 2 Score 2  Altered sleeping 0  Tired, decreased energy 2  Change in appetite 0  Feeling bad or failure about yourself  1  Trouble concentrating 0  Moving slowly or fidgety/restless 0  Suicidal thoughts 0  PHQ-9 Score 5  Difficult doing work/chores Somewhat difficult    There were no vitals filed for this visit.  Medications Reviewed Today     Reviewed by Jens Molder, LCSW (Social Worker) on 07/06/23 at 1006  Med List Status: <None>   Medication Order Taking? Sig Documenting Provider Last Dose Status Informant  acetaminophen  (TYLENOL ) 500 MG tablet [id number] No Take 2 tablets (1,000 mg total) by mouth in the morning, at noon, and at bedtime. Clearnce Curia, NP Taking Active   alendronate  (FOSAMAX ) 70 MG tablet [id number] No TAKE 1 TABLET BY MOUTH EVERY SUNDAY Early, [name], NP  Taking Active   apixaban  (ELIQUIS ) 5 MG TABS tablet  [id number] No Take 1 tablet (5 mg total) by mouth 2 (two) times daily. Hazle Lites, MD Taking Active Self  atorvastatin  (LIPITOR) 40 MG tablet [id number] No Take 1 tablet (40 mg total) by mouth daily. Annella Kief, NP Taking Expired 07/02/23 2359   Calcium -Magnesium -Vitamin D  (CALCIUM  1200+D3 PO) [id number] No Take 1 tablet by mouth in the morning. [provider] Taking Active Self  cetirizine  (ZYRTEC ) 10 MG tablet [id number] No TAKE 1 TABLET BY MOUTH EVERY DAY Dewald, Jonathan B, MD Taking Active Self  Cyanocobalamin  (B-12) 1000 MCG SUBL [id number] No Place 1 tablet under the tongue daily at 6 (six) AM. Early, Adriane Albe, NP Taking Active Self  Elastic Bandages & Supports (MEDICAL COMPRESSION STOCKINGS) MISC [id number] No Two pair knee high 30-80mmHg compression stockings. Sized XL. As covered by insurance for ICD-10: I50.33, R60.0. Early, [name], NP Taking Active Self           Med Note Jefrey Mink Jul 02, 2023  1:52 PM) Wants to check on it.   fluticasone  (FLONASE) 50 MCG/ACT nasal spray [id number] No Place 1 spray into both nostrils 2 (two) times daily as needed for allergies or rhinitis. [provider] Taking Active Self  Fluticasone -Umeclidin-Vilant (TRELEGY ELLIPTA ) 100-62.5-25 MCG/ACT AEPB [id number] No Inhale 1 puff into the lungs daily. Wilfredo Hanly, MD Taking Active Self  ipratropium-albuterol  (DUONEB) 0.5-2.5 (3) MG/3ML SOLN [id number] No Inhale 3 mLs into the lungs every 6 (six) hours as needed (wheezing). Wilfredo Hanly, MD Taking Active Self  LORazepam  (ATIVAN ) 1 MG tablet [id number] No TAKE 1 TABLET BY MOUTH 2 TIMES DAILY AS NEEDED FOR ANXIETY Early, Adriane Albe, NP Taking Active   Menthol (COUGH DROPS) 5.8 MG LOZG [id number] No Use as directed 1 lozenge (5.8 mg total) in the mouth or throat as needed (for dry, sore, or scratchy throat). Early, [name], NP Taking Active Self  metoprolol  tartrate (LOPRESSOR ) 25 MG tablet [id number] No TAKE 1 TABLET BY MOUTH 2 TIMES  DAILY Early, [name], NP Taking Active Self  mirtazapine  (REMERON ) 45 MG tablet [id number] No Take 1 tablet (45 mg total) by mouth at bedtime as needed (sleep). Early, [name], NP Taking Active            Med Note Hazel Listen, Cindy Creed   Mon Jul 02, 2023  1:56 PM) Told pharmacy she does not need and she does  montelukast (SINGULAIR) 10 MG tablet [id number] No Take 10 mg by mouth at bedtime. [provider] Taking Active Self  Multiple Vitamin (MULTIVITAMIN WITH MINERALS) TABS tablet [id number] No Take 1 tablet by mouth daily with breakfast. [provider] Taking Active Self  OXYGEN  [id number] No Inhale 2 L/min into the lungs continuous. [provider] Taking Active Self  pantoprazole  (PROTONIX ) 20 MG tablet [id number] No TAKE 1 TABLET BY MOUTH ONCE DAILY Early, [name], NP Taking Active   sertraline  (ZOLOFT ) 100 MG tablet [id number] No TAKE 1 TABLET BY MOUTH AT BEDTIME Tysinger, Christiane Cowing, [name] Taking Active   Spacer/Aero-Holding Chambers (AEROCHAMBER MV) inhaler [id number] No Use as instructed  Patient not taking: Reported on 07/02/2023   Wilfredo Hanly, MD Not Taking Active Self  spironolactone  (ALDACTONE ) 25 MG tablet [id number] No Take 0.5 tablets (12.5 mg total) by mouth daily. Clearnce Curia, NP Taking Active   spironolactone  (ALDACTONE ) 25 MG tablet [id number] No TAKE A HALF TABLET DAILY AS NEEDED FOR  EDEMA OR FLUID RETENTION Clearnce Curia, NP Taking Active   torsemide  (DEMADEX ) 20 MG tablet [id number] No TAKE 2 TABLETS DAILY Walker, [name], NP Taking Active   Vitamin D , Ergocalciferol , (DRISDOL ) 1.25 MG (50000 UNIT) CAPS capsule [id number] No Take 1 capsule (50,000 Units total) by mouth every 7 (seven) days. Early, [name], NP Taking Active             Recommendation:   Continue to go to public areas for meals in independent living facility, attend activities. Adjusted Zoloft  medication with PCP earlier this week.Discussed attending activities and reviewed behavioral  activation regarding her mood and her behaviors. Reviewed strategies for attending social events and managing negative thoughts regarding going out.  Follow Up Plan:   Telephone follow up appointment date/time:  07/26/2023  Hale Level, LCSW [facility], [facility] Licensed Clinical Social Worker Care Coordinator [phone]

## 2023-07-09 ENCOUNTER — Ambulatory Visit: Admitting: Pulmonary Disease

## 2023-07-10 ENCOUNTER — Ambulatory Visit (HOSPITAL_BASED_OUTPATIENT_CLINIC_OR_DEPARTMENT_OTHER): Admitting: Family

## 2023-07-10 ENCOUNTER — Encounter (HOSPITAL_BASED_OUTPATIENT_CLINIC_OR_DEPARTMENT_OTHER): Payer: Self-pay

## 2023-07-10 NOTE — Progress Notes (Deleted)
 Cardiology Office Note:  .   Date:  07/10/2023  ID:  Lindsay [name], DOB [birth year], MRN [id number] PCP: Lindsay Kief, NP  [facility] Providers Cardiologist:  Lindsay Lites, MD    History of Present Illness: .   Lindsay [name] is a 83 y.o. 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   Atrial fibrillation diagnosed many years ago and Eliquis  initiated by primary care. Managed on Torsemide  for chronic edema with improvement in fall 2021 after addition of Metolazone.    She was admitted 08/02/20 with acute on chronic hypoxic respiratory failure [name] to acute on chronic diastolic heart failure. Treated with IV diuresis. She was evaluated by speech therapy and also underwent endoscopy with achalasia recommended for soft diet.    Underwent successful cardioversion 09/13/2020.  Then lost to follow up til admitted 04/2021 with acute on chronic respiratory failure and asthma exacerbation.  Echocardiogram 05/10/2021 during admission with LVEF 60 to 65%, no RWMA, normal pulmonary pressure, left atrium mildly dilated, trivial MR.    Seen 01/02/2022 with rate controlled atrial fibrillation with minimal symptomology recommended for continued rate control.    Updated CTA10/19/24 stable mild dilation aortic arch 4cm. Also noted moderate R and small to moderate L pleural effusions and ground glass in both lungs as well as chronic deformities of the spine.  Admitted 10/18-10/24/24 with acute on chronic respiratory failure and cute on chronic diastolic heart failure.    Last seen in clinic 02/09/2023 with increased dyspnea for which pulmonary had just started Z-Pak, extended prednisone  taper.  BP was minimally elevated in the setting of illness.  She saw PCP team via telemedicine 04/16/2023 with  volume overloaded.  They reached out for recommendations.  Torsemide  increased to 60 mg for short-term and spironolactone  12.5 mg daily initiated for volume status as well as to increase potassium.  ED visit 05/02/23 with fall with concern for syncope.  Chest x-ray, CT head, cervical spine, maxillofacial scans were unremarkable.  She was recommended for discharge home ILF.  Last seen 05/08/23 encouraged to utilize Tylenol  and lidocaine  patch as well as follow up with PCP for back pin post fall. Torsemide  adjusted to 40mg  daily [name] to relative hypotension. Potassium tablet stopped as Spironolactone  had corrected hypokalemia. She was given permission to take additional 12.5mg  of Spironolactone  for LE edema PRN. However, later adjusted to Torsemide  40mg  PRN in the afternoon for breakthrough LE edema.   ?CTA vs MRA monitoring of aorta 10/2023 ?lipid panel August  Presents today for follow-up with her daughter.***   ROS: Please see the history of present illness.    All other systems reviewed and are negative.   Studies Reviewed: .           Risk Assessment/Calculations:      CHA2DS2-VASc Score = 6   This indicates a 9.7% annual risk of stroke. The patient's score is based upon: CHF History: 1 HTN History: 1 Diabetes History: 0 Stroke History: 0 Vascular Disease History: 1 Age Score: 2 Gender Score: 1   {This patient has a significant risk of stroke if diagnosed with atrial fibrillation.  Please consider VKA or DOAC agent for anticoagulation if the bleeding risk is acceptable.   You can also use the SmartPhrase .[email or web address] for documentation.   :[id number]}      Physical Exam:  VS:  There were no vitals taken for this visit.   Wt Readings from Last 3 Encounters:  05/08/23 226 lb (102.5 kg)  05/02/23 225 lb 5 oz (102.2 kg)  04/17/23 225 lb 6.4 oz (102.2 kg)    GEN: Well nourished, overweight, well developed in no acute distress NECK: No JVD; No carotid bruits CARDIAC: IRIR, no  murmurs, rubs, gallops RESPIRATORY:  Clear to auscultation without rales, wheezing or rhonchi. On 2L O2. ABDOMEN: Soft, non-tender, non-distended EXTREMITIES:  No edema; No deformity. Lower back with no ecchymosis, tender on exam. FACIAL: significant yellowed and purple ecchymosis around both occipital region L>R  ASSESSMENT AND PLAN: .     HFpEF - Telemedicine visit with PCP 04/16/23 with dyspnea, edema. Torsemide  increased to 60mg  daily and Spironolactone  12.5mg  daily added. LE edema much improved. Now relatively hypotensive, reports only intermittent lightheadedness. Recent poor PO intake [name] to back pain, detailed above. No LE edema on exam. 02/09/23 weight in our clinic 239 lbs ? today 226 lbs.  Continue Torsemide  40mg  daily. May take additional 40mg  PRN in the afternoon for LE edema. Continue Spironolactone  12.5mg  daily. I anticipate management of her volume status is going to be limited by relative hypotension. If hypotension persists, may need to adjust her Torsemide  to 20mg  AM and 20mg  in the afternoon to split dosing to avoid sudden BP drop. ***  Atrial fibrillation / Chronic anticoagulation - Plan for continued rate control. Continue present dose of Toprol  25 mg daily.  Continue Eliquis  5 mg twice daily. Denies bleeding complications. CHA2DS2-VASc Score = 6 [CHF History: 1, HTN History: 1, Diabetes History: 0, Stroke History: 0, Vascular Disease History: 1, Age Score: 2, Gender Score: 1].  Therefore, the patient's annual risk of stroke is 9.7 %.    Does not meet dose reduction criteria.  If she has recurrent falls we will need to revisit the risk versus benefit of anticoagulation.  Does not meet dose reduction criteria.  Chronic calcification on CT/Aortic atherosclerosis/HLD, LDL goal <70 - No anginal symptoms. No indication for ischemic evaluation.  02/2023 LDL 88. Improved from labs 6 mos prior with LDL 105. Continue Atorvastatin  40mg  daily and lifestyle changes. ***  Aneurysmal aotic arch  - CT 05/21/22 and 10/2022 with measurement 4cm which is stable. Continue optimal BP control and Toprol . Recommend avoidance of fluoroquinolones.  Consider repeat CTA vs MRA for monitoring 10/2023. Can coordinate at follow up. ***          Dispo: follow up in *** mos  Signed, Clearnce Curia, NP

## 2023-07-11 ENCOUNTER — Encounter: Payer: Self-pay | Admitting: Nurse Practitioner

## 2023-07-11 DIAGNOSIS — Z79899 Other long term (current) drug therapy: Secondary | ICD-10-CM | POA: Insufficient documentation

## 2023-07-11 NOTE — Assessment & Plan Note (Signed)
 Lindsay [name] experienced a fall two months ago, possibly due to orthostatic hypotension. She reports hypotension and dizziness upon standing, potentially related to medication adjustments and deconditioning. Her blood pressure was low during an ER visit. - Encourage slow position changes to prevent dizziness - Monitor blood pressure regularly - Consider physical therapy to improve strength and balance

## 2023-07-11 NOTE — Assessment & Plan Note (Signed)
 Lindsay [name] experiences episodes of choking and dysphagia, particularly with large pills. A failed swallow test and recommendations for speech therapy were noted. She has been advised to use applesauce to aid in swallowing large pills. - Refer to gastroenterology for evaluation of potential esophageal stricture - Initiate speech therapy to improve swallowing mechanics

## 2023-07-11 NOTE — Assessment & Plan Note (Signed)
 Lindsay [name] has experienced confusion with medication management, including incorrect blister pack contents and cessation of important medications, leading to exacerbation of symptoms and potential health risks. She has been without sertraline  since last Thursday due to a pharmacy error. Lindsay [name] resides in a community setting with access to meals and social activities. Limited physical activity and social engagement due to recent health issues are concerns. She has been encouraged to participate in community dining and social activities to improve her mood and physical health. - Encourage participation in [employer] and social activities - Promote regular physical activity to prevent deconditioning - Review and reconcile medication list with the pharmacy - Create a clear medication schedule for Lindsay [name] - Consider alternative medication packaging options for clarity

## 2023-07-11 NOTE — Assessment & Plan Note (Signed)
>>  ASSESSMENT AND PLAN FOR COPD WITH ACUTE EXACERBATION (HCC) WRITTEN ON 07/11/2023  2:47 PM BY Jc Veron E, NP  Lindsay [name] reports significant fatigue and dyspnea over the past five to six days, exacerbated by minimal exertion. Recent cessation of spironolactone  and potassium may contribute to these symptoms. She also reports chest discomfort and congestion, which have mostly resolved. - Check laboratory tests to assess for electrolyte imbalances and other abnormalities - Restart spironolactone  as previously prescribed - Monitor symptoms and adjust medications as needed

## 2023-07-11 NOTE — Assessment & Plan Note (Signed)
Chronic morbid obesity in the setting of hypertension, hyperlipidemia, chronic heart failure, and COPD.  Unfortunately her respiratory and cardiac status limit her ability for physical activity.  Dietary recommendations of low calorie, low carbohydrate, low-fat diet are recommended.  Will continue to monitor closely.

## 2023-07-11 NOTE — Assessment & Plan Note (Signed)
 Lindsay [name] has depression and anxiety, previously managed with sertraline  and mirtazapine . She stopped mirtazapine , which may have contributed to increased depression, anxiety, and nightmares. Mirtazapine  aids in mood stabilization, appetite, and sleep. Sudden cessation can lead to withdrawal symptoms, including nightmares. - Restart sertraline  at 100 mg at bedtime, increase to 150 mg after one week if tolerated - Monitor mood and anxiety symptoms - Consider restarting mirtazapine  if symptoms persist after two weeks on sertraline 

## 2023-07-11 NOTE — Assessment & Plan Note (Signed)
 Lindsay [name] reports significant fatigue and dyspnea over the past five to six days, exacerbated by minimal exertion. Recent cessation of spironolactone  and potassium may contribute to these symptoms. She also reports chest discomfort and congestion, which have mostly resolved. - Check laboratory tests to assess for electrolyte imbalances and other abnormalities - Restart spironolactone  as previously prescribed - Monitor symptoms and adjust medications as needed

## 2023-07-11 NOTE — Assessment & Plan Note (Signed)
>>  ASSESSMENT AND PLAN FOR RECURRENT MAJOR DEPRESSIVE DISORDER, IN PARTIAL REMISSION WRITTEN ON 07/11/2023  2:46 PM BY Haley Fuerstenberg E, NP  Lindsay [name] has depression and anxiety, previously managed with sertraline  and mirtazapine . She stopped mirtazapine , which may have contributed to increased depression, anxiety, and nightmares. Mirtazapine  aids in mood stabilization, appetite, and sleep. Sudden cessation can lead to withdrawal symptoms, including nightmares. - Restart sertraline  at 100 mg at bedtime, increase to 150 mg after one week if tolerated - Monitor mood and anxiety symptoms - Consider restarting mirtazapine  if symptoms persist after two weeks on sertraline

## 2023-07-12 ENCOUNTER — Telehealth: Payer: Self-pay

## 2023-07-12 ENCOUNTER — Other Ambulatory Visit

## 2023-07-12 DIAGNOSIS — J449 Chronic obstructive pulmonary disease, unspecified: Secondary | ICD-10-CM | POA: Diagnosis not present

## 2023-07-12 DIAGNOSIS — R0602 Shortness of breath: Secondary | ICD-10-CM | POA: Diagnosis not present

## 2023-07-12 NOTE — Progress Notes (Signed)
   07/12/2023  Patient ID: Lindsay [name], female   DOB: Sep 10, 1940, 83 y.o.   MRN: [id number]  Received notice that [facility] was missing the provider portion of the Eliquis  application. Resent in for processing via fax.  Carnell Christian, PharmD Clinical Pharmacist [phone]

## 2023-07-12 NOTE — Telephone Encounter (Signed)
 Do you have an order for PT or would you like me to give them a verbal order for PT.   Copied from CRM [id number]. Topic: Clinical - Home Health Verbal Orders >> Jul 12, 2023  3:28 PM Kevelyn M wrote: Caller/Agency: [facility] Callback Number: [phone] phone/[phone] Service Requested: Physical Therapy  They sent in an order already, just waiting for approval

## 2023-07-13 ENCOUNTER — Telehealth: Payer: Self-pay

## 2023-07-13 NOTE — Telephone Encounter (Signed)
 Message printed off and given to Jeffrie Minion to see if she has th eorder in her folder or if she needs to write an order then I can fax it.   Copied from CRM [id number]. Topic: General - Other >> Jul 13, 2023  9:53 AM El Gravely T wrote: Reason for CRM: Received call from Erby Hatcher, with [employer], returning call to Raylene Calamity at office, regarding patient orders.   Per caller, need signed written orders instead of verbal orders that was received.   Please fax signed written orders to [phone]  Erby Hatcher can be reached at ph. [phone] with any further questions or concerns.

## 2023-07-13 NOTE — Telephone Encounter (Signed)
 I just spoke with Lindsay [name] and let her know I would fax the PT and speech therapy orders/referrals over. She mentioned the pt needing OT as well. Let me know if Lindsay [name] wants to put in OT and I will send that as well

## 2023-07-16 ENCOUNTER — Other Ambulatory Visit: Payer: Self-pay

## 2023-07-16 DIAGNOSIS — R296 Repeated falls: Secondary | ICD-10-CM

## 2023-07-19 DIAGNOSIS — R296 Repeated falls: Secondary | ICD-10-CM | POA: Diagnosis not present

## 2023-07-19 DIAGNOSIS — M62511 Muscle wasting and atrophy, not elsewhere classified, right shoulder: Secondary | ICD-10-CM | POA: Diagnosis not present

## 2023-07-19 DIAGNOSIS — M62512 Muscle wasting and atrophy, not elsewhere classified, left shoulder: Secondary | ICD-10-CM | POA: Diagnosis not present

## 2023-07-19 DIAGNOSIS — R278 Other lack of coordination: Secondary | ICD-10-CM | POA: Diagnosis not present

## 2023-07-24 ENCOUNTER — Other Ambulatory Visit: Payer: Self-pay | Admitting: Nurse Practitioner

## 2023-07-24 ENCOUNTER — Other Ambulatory Visit (HOSPITAL_BASED_OUTPATIENT_CLINIC_OR_DEPARTMENT_OTHER): Payer: Self-pay | Admitting: Medical

## 2023-07-24 DIAGNOSIS — F411 Generalized anxiety disorder: Secondary | ICD-10-CM

## 2023-07-24 DIAGNOSIS — F3341 Major depressive disorder, recurrent, in partial remission: Secondary | ICD-10-CM

## 2023-07-25 DIAGNOSIS — M62512 Muscle wasting and atrophy, not elsewhere classified, left shoulder: Secondary | ICD-10-CM | POA: Diagnosis not present

## 2023-07-25 DIAGNOSIS — R296 Repeated falls: Secondary | ICD-10-CM | POA: Diagnosis not present

## 2023-07-25 DIAGNOSIS — M62511 Muscle wasting and atrophy, not elsewhere classified, right shoulder: Secondary | ICD-10-CM | POA: Diagnosis not present

## 2023-07-25 DIAGNOSIS — R278 Other lack of coordination: Secondary | ICD-10-CM | POA: Diagnosis not present

## 2023-07-26 ENCOUNTER — Other Ambulatory Visit (INDEPENDENT_AMBULATORY_CARE_PROVIDER_SITE_OTHER)

## 2023-07-26 ENCOUNTER — Encounter: Payer: Self-pay | Admitting: Licensed Clinical Social Worker

## 2023-07-26 ENCOUNTER — Telehealth: Payer: Self-pay | Admitting: Licensed Clinical Social Worker

## 2023-07-26 DIAGNOSIS — Z79899 Other long term (current) drug therapy: Secondary | ICD-10-CM

## 2023-07-26 NOTE — Progress Notes (Signed)
 07/26/2023 Name: Lindsay [name] MRN: [id number] DOB: 08-12-1940  Chief Complaint  Patient presents with   Medication Management    Lindsay [name] is a 83 y.o. year old female who presented for a telephone visit.   They were referred to the pharmacist by their PCP for assistance in managing complex medication management.    Subjective:  Care Team: Primary Care Provider: Oris [name], NP ; Next Scheduled Visit: 10/02/23  Medication Access/Adherence  Current Pharmacy:  [facility] - [place], [place] - [address] [place] [address] Phone: [phone] Fax: [phone]  [facility] - [place], [place] - [address] [place] [address] Phone: [phone] Fax: [phone]   Patient reports affordability concerns with their medications: Yes  - Eliquis , PAP denied right now until patient reaches OOP, patient will reach out if samples needed or to discuss alternatives. Will continue to monitor at follow ups Patient reports access/transportation concerns to their pharmacy: No  Patient reports adherence concerns with their medications:  Yes  - states pharmacy had been leaving out her sertraline  in error, is taking now   Medication Management: -Patient has resumed sertaline 100mg  at bedtime and it is in her pill packaging. Reports she is still feeling anxious but had not increased dose yet like PCP had recommended at previous visit. -Still not taking mirtazapine  either, did not want to add or increase too many things at once -Getting eliquis  okay at this time, will let us  know if this changes -Prefers to stay with current pharmacy at this time   Objective:  Lab Results  Component Value Date   HGBA1C 5.9 (H) 03/22/2023    Lab Results  Component Value Date   CREATININE 1.20 (H) 07/02/2023   BUN 19 07/02/2023   NA 142 07/02/2023   K 4.0 07/02/2023   CL 98 07/02/2023   CO2 27 07/02/2023    Lab  Results  Component Value Date   CHOL 155 07/02/2023   HDL 58 07/02/2023   LDLCALC 83 07/02/2023   LDLDIRECT 108 (H) 06/30/2022   TRIG 75 07/02/2023   CHOLHDL 2.7 07/02/2023    Medications Reviewed Today     Reviewed by Lionell [name], RPH (Pharmacist) on 07/26/23 at 1132  Med List Status: <None>   Medication Order Taking? Sig Documenting Provider Last Dose Status Informant  acetaminophen  (TYLENOL ) 500 MG tablet [id number]  Take 2 tablets (1,000 mg total) by mouth in the morning, at noon, and at bedtime. [name], NP  Active   alendronate  (FOSAMAX ) 70 MG tablet [id number] Yes TAKE 1 TABLET BY MOUTH EVERY SUNDAY Early, [name], NP  Active   apixaban  (ELIQUIS ) 5 MG TABS tablet [id number] Yes Take 1 tablet (5 mg total) by mouth 2 (two) times daily. Mona Vinie BROCKS, MD  Active Self  atorvastatin  (LIPITOR) 40 MG tablet [id number] Yes Take 1 tablet (40 mg total) by mouth daily. Early, [name], NP  Active   Calcium -Magnesium -Vitamin D  (CALCIUM  1200+D3 PO) [id number] Yes Take 1 tablet by mouth in the morning. [provider]  Active Self  cetirizine  (ZYRTEC ) 10 MG tablet [id number] Yes TAKE 1 TABLET BY MOUTH EVERY DAY Dewald, Jonathan B, MD  Active Self  Cyanocobalamin  (B-12) 1000 MCG SUBL [id number] Yes Place 1 tablet under the tongue daily at 6 (six) AM. Early, [name], NP  Active Self  Elastic Bandages & Supports (MEDICAL COMPRESSION STOCKINGS) MISC [id number]  Two pair  knee high 30-71mmHg compression stockings. Sized XL. As covered by insurance for ICD-10: I50.33, R60.0. Early, [name], NP  Active Self           Med Note (GANN, RILEY C   Mon Jul 02, 2023  1:52 PM) Wants to check on it.   fluticasone  (FLONASE) 50 MCG/ACT nasal spray [id number] Yes Place 1 spray into both nostrils 2 (two) times daily as needed for allergies or rhinitis. [provider]  Active Self  Fluticasone -Umeclidin-Vilant (TRELEGY ELLIPTA ) 100-62.5-25 MCG/ACT AEPB [id number] Yes Inhale 1 puff into the lungs  daily. [name], MD  Active Self  ipratropium-albuterol  (DUONEB) 0.5-2.5 (3) MG/3ML SOLN [id number] Yes Inhale 3 mLs into the lungs every 6 (six) hours as needed (wheezing). [name], MD  Active Self  LORazepam  (ATIVAN ) 1 MG tablet [id number] Yes TAKE 1 TABLET BY MOUTH 2 TIMES DAILY AS NEEDED FOR ANXIETY Early, [name], NP  Active   Menthol (COUGH DROPS) 5.8 MG LOZG [id number]  Use as directed 1 lozenge (5.8 mg total) in the mouth or throat as needed (for dry, sore, or scratchy throat). Early, [name], NP  Active Self  metoprolol  tartrate (LOPRESSOR ) 25 MG tablet [id number] Yes TAKE 1 TABLET BY MOUTH 2 TIMES DAILY Early, [name], NP  Active   mirtazapine  (REMERON ) 45 MG tablet [id number]  Take 1 tablet (45 mg total) by mouth at bedtime as needed (sleep). Early, [name], NP  Active            Med Note MAPLE, CARLO BROCKS   Mon Jul 02, 2023  1:56 PM) Told pharmacy she does not need and she does  montelukast (SINGULAIR) 10 MG tablet [id number] Yes Take 10 mg by mouth at bedtime. [provider]  Active Self  Multiple Vitamin (MULTIVITAMIN WITH MINERALS) TABS tablet [id number] Yes Take 1 tablet by mouth daily with breakfast. [provider]  Active Self  OXYGEN  [id number]  Inhale 2 L/min into the lungs continuous. [provider]  Active Self  pantoprazole  (PROTONIX ) 20 MG tablet [id number] Yes TAKE 1 TABLET BY MOUTH ONCE DAILY Early, [name], NP  Active   sertraline  (ZOLOFT ) 100 MG tablet [id number] Yes TAKE 1 TABLET BY MOUTH AT BEDTIME Early, [name], NP  Active   Spacer/Aero-Holding Chambers (AEROCHAMBER MV) inhaler [id number]  Use as instructed  Patient not taking: Reported on 07/02/2023   [name], MD  Active Self  spironolactone  (ALDACTONE ) 25 MG tablet [id number]  Take 0.5 tablets (12.5 mg total) by mouth daily. [name], NP  Active   spironolactone  (ALDACTONE ) 25 MG tablet [id number] Yes TAKE A HALF TABLET DAILY AS NEEDED FOR EDEMA OR FLUID RETENTION  Walker, [name], NP  Active   torsemide  (DEMADEX ) 20 MG tablet [id number] Yes TAKE 2 TABLETS DAILY Walker, [name], NP  Active   Vitamin D , Ergocalciferol , (DRISDOL ) 1.25 MG (50000 UNIT) CAPS capsule [id number] Yes Take 1 capsule (50,000 Units total) by mouth every 7 (seven) days. Early, [name], NP  Active               Assessment/Plan:   Medication Management: -Reviewed medication list and confirmed patient is taking everything as prescribed at this time, with the exception of the mirtazapine  -Starting tonight, patient will increase to sertraline  100mg  1 and 1/2 tabs at bedtime as previously instructed by PCP. At f/u, will coordinate rx with updated instructions if needed. Consider restarting mirtazapine  at that time if needed. -Made patient aware that  other packaging pharmacies are in the area, patient elects to stay with current pharmacy for now. -Recommend monthly calls with patient to monitor pill packaging and to ensure all meds are being included, patient agrees    Follow Up Plan: 2 weeks  Jon VEAR Lindau, PharmD Clinical Pharmacist [phone]

## 2023-07-30 ENCOUNTER — Encounter: Payer: Self-pay | Admitting: Physician Assistant

## 2023-07-31 ENCOUNTER — Ambulatory Visit (INDEPENDENT_AMBULATORY_CARE_PROVIDER_SITE_OTHER)

## 2023-07-31 DIAGNOSIS — Z0000 Encounter for general adult medical examination without abnormal findings: Secondary | ICD-10-CM

## 2023-07-31 NOTE — Progress Notes (Signed)
 Subjective:   Lindsay [name] is a 83 y.o. who presents for a Medicare Wellness preventive visit.  As a reminder, Annual Wellness Visits don't include a physical exam, and some assessments may be limited, especially if this visit is performed virtually. We may recommend an in-person follow-up visit with your provider if needed.  Visit Complete: Virtual I connected with  Lindsay [name] on 07/31/23 by a audio enabled telemedicine application and verified that I am speaking with the correct person using two identifiers.  Patient Location: Home  Provider Location: Office/Clinic  I discussed the limitations of evaluation and management by telemedicine. The patient expressed understanding and agreed to proceed.  Vital Signs: Because this visit was a virtual/telehealth visit, some criteria may be missing or patient reported. Any vitals not documented were not able to be obtained and vitals that have been documented are patient reported.  VideoError- Librarian, academic were attempted between this provider and patient, however failed, due to patient having technical difficulties OR patient did not have access to video capability.  We continued and completed visit with audio only.   Persons Participating in Visit: Patient.  AWV Questionnaire: Yes: Patient Medicare AWV questionnaire was completed by the patient on 07/25/2023; I have confirmed that all information answered by patient is correct and no changes since this date.  Cardiac Risk Factors include: advanced age (>31men, >52 women);dyslipidemia;hypertension     Objective:    Today's Vitals   There is no height or weight on file to calculate BMI.     07/31/2023    8:21 AM 05/02/2023   10:27 PM 03/22/2023    6:48 PM 11/10/2022    3:33 PM 07/11/2022   11:25 AM 06/14/2022    6:54 PM 05/21/2022    1:03 PM  Advanced Directives  Does Patient Have a Medical Advance Directive? Yes No No Yes Yes No Yes  Type of Sports coach of [place];Living will   Healthcare Power of [employer] of [place];Living will  Healthcare Power of Attorney  Does patient want to make changes to medical advance directive?    No - Patient declined   No - Patient declined  Copy of Healthcare Power of Attorney in Chart? No - copy requested   No - copy requested No - copy requested    Would patient like information on creating a medical advance directive?  No - Patient declined No - Patient declined    No - Patient declined    Current Medications (verified) Outpatient Encounter Medications as of 07/31/2023  Medication Sig   acetaminophen  (TYLENOL ) 500 MG tablet Take 2 tablets (1,000 mg total) by mouth in the morning, at noon, and at bedtime.   alendronate  (FOSAMAX ) 70 MG tablet TAKE 1 TABLET BY MOUTH EVERY SUNDAY   apixaban  (ELIQUIS ) 5 MG TABS tablet Take 1 tablet (5 mg total) by mouth 2 (two) times daily.   atorvastatin  (LIPITOR) 40 MG tablet Take 1 tablet (40 mg total) by mouth daily.   Calcium -Magnesium -Vitamin D  (CALCIUM  1200+D3 PO) Take 1 tablet by mouth in the morning.   cetirizine  (ZYRTEC ) 10 MG tablet TAKE 1 TABLET BY MOUTH EVERY DAY   Cyanocobalamin  (B-12) 1000 MCG SUBL Place 1 tablet under the tongue daily at 6 (six) AM.   Elastic Bandages & Supports (MEDICAL COMPRESSION STOCKINGS) MISC Two pair knee high 30-60mmHg compression stockings. Sized XL. As covered by insurance for ICD-10: I50.33, R60.0.   fluticasone  (FLONASE) 50 MCG/ACT nasal spray Place 1 spray  into both nostrils 2 (two) times daily as needed for allergies or rhinitis.   Fluticasone -Umeclidin-Vilant (TRELEGY ELLIPTA ) 100-62.5-25 MCG/ACT AEPB Inhale 1 puff into the lungs daily.   ipratropium-albuterol  (DUONEB) 0.5-2.5 (3) MG/3ML SOLN Inhale 3 mLs into the lungs every 6 (six) hours as needed (wheezing).   LORazepam  (ATIVAN ) 1 MG tablet TAKE 1 TABLET BY MOUTH 2 TIMES DAILY AS NEEDED FOR ANXIETY   Menthol (COUGH DROPS) 5.8 MG LOZG Use  as directed 1 lozenge (5.8 mg total) in the mouth or throat as needed (for dry, sore, or scratchy throat).   metoprolol  tartrate (LOPRESSOR ) 25 MG tablet TAKE 1 TABLET BY MOUTH 2 TIMES DAILY   mirtazapine  (REMERON ) 45 MG tablet Take 1 tablet (45 mg total) by mouth at bedtime as needed (sleep).   montelukast (SINGULAIR) 10 MG tablet Take 10 mg by mouth at bedtime.   Multiple Vitamin (MULTIVITAMIN WITH MINERALS) TABS tablet Take 1 tablet by mouth daily with breakfast.   OXYGEN  Inhale 2 L/min into the lungs continuous.   pantoprazole  (PROTONIX ) 20 MG tablet TAKE 1 TABLET BY MOUTH ONCE DAILY   sertraline  (ZOLOFT ) 100 MG tablet TAKE 1 TABLET BY MOUTH AT BEDTIME   spironolactone  (ALDACTONE ) 25 MG tablet Take 0.5 tablets (12.5 mg total) by mouth daily.   torsemide  (DEMADEX ) 20 MG tablet TAKE 2 TABLETS DAILY   Vitamin D , Ergocalciferol , (DRISDOL ) 1.25 MG (50000 UNIT) CAPS capsule Take 1 capsule (50,000 Units total) by mouth every 7 (seven) days.   Spacer/Aero-Holding Chambers (AEROCHAMBER MV) inhaler Use as instructed (Patient not taking: Reported on 07/31/2023)   spironolactone  (ALDACTONE ) 25 MG tablet TAKE A HALF TABLET DAILY AS NEEDED FOR EDEMA OR FLUID RETENTION (Patient not taking: Reported on 07/31/2023)   No facility-administered encounter medications on file as of 07/31/2023.    Allergies (verified) Acetaminophen -codeine, Naproxen-esomeprazole mg, and Codeine   History: Past Medical History:  Diagnosis Date   Acute diastolic CHF (congestive heart failure) (HCC) 08/03/2020   Acute non-recurrent pansinusitis 10/21/2020   Acute on chronic diastolic CHF (congestive heart failure) (HCC) 08/03/2020   Acute on chronic respiratory failure with hypoxia (HCC) 08/02/2020   Acute on chronic respiratory failure with hypoxia and hypercapnia (HCC) 11/10/2022   Allergy    Anxiety    Asthma    Asthma exacerbation 08/02/2020   Atrial fibrillation (HCC)    Cataract    COPD (chronic obstructive pulmonary  disease) (HCC)    Depression    Dysphagia 08/02/2020   Dyspnea 05/09/2021   Elevated troponin 09/14/2020   GERD (gastroesophageal reflux disease)    High cholesterol    Hospital discharge follow-up 11/28/2022   HTN (hypertension)    Hyperkalemia 09/14/2020   Hyponatremia 08/02/2020   Low grade squamous intraepithelial lesion on cytologic smear of cervix (LGSIL) 12/02/2019   Moderate persistent asthma without complication 12/02/2019   Non-healing wound of right lower extremity 08/01/2022   Osteoporosis    Oxygen  deficiency    Papule of skin 11/29/2022   Respiratory failure with hypoxia (HCC)    Sinus congestion 11/29/2022   Sore throat 11/16/2020   Past Surgical History:  Procedure Laterality Date   APPENDECTOMY     BREAST SURGERY     CARDIOVERSION N/A 09/13/2020   Procedure: CARDIOVERSION;  Surgeon: Loni Soyla LABOR, MD;  Location: [facility];  Service: Cardiovascular;  Laterality: N/A;   ESOPHAGOGASTRODUODENOSCOPY N/A 08/05/2020   Procedure: ESOPHAGOGASTRODUODENOSCOPY (EGD);  Surgeon: Elicia Claw, MD;  Location: [facility];  Service: Gastroenterology;  Laterality: N/A;   EYE SURGERY  TUBAL LIGATION     Family History  Problem Relation Age of Onset   Breast cancer Sister    Hypertension Other    Social History   Socioeconomic History   Marital status: Divorced    Spouse name: Not on file   Number of children: Not on file   Years of education: Not on file   Highest education level: Some college, no degree  Occupational History   Not on file  Tobacco Use   Smoking status: Former    Types: Cigarettes   Smokeless tobacco: Never   Tobacco comments:    Quit remotely 1970s  Vaping Use   Vaping status: Never Used  Substance and Sexual Activity   Alcohol use: Not Currently   Drug use: Never   Sexual activity: Not Currently  Other Topics Concern   Not on file  Social History Narrative   Not on file   Social Drivers of Health   Financial Resource  Strain: Medium Risk (07/25/2023)   Overall Financial Resource Strain (CARDIA)    Difficulty of Paying Living Expenses: Somewhat hard  Food Insecurity: No Food Insecurity (07/25/2023)   Hunger Vital Sign    Worried About Running Out of Food in the Last Year: Never true    Ran Out of Food in the Last Year: Never true  Recent Concern: Food Insecurity - Food Insecurity Present (07/20/2023)   Hunger Vital Sign    Worried About Running Out of Food in the Last Year: Sometimes true    Ran Out of Food in the Last Year: Sometimes true  Transportation Needs: Unmet Transportation Needs (07/25/2023)   PRAPARE - Transportation    Lack of Transportation (Medical): Yes    Lack of Transportation (Non-Medical): Yes  Physical Activity: Inactive (07/25/2023)   Exercise Vital Sign    Days of Exercise per Week: 0 days    Minutes of Exercise per Session: Not on file  Stress: Stress Concern Present (07/25/2023)   [employer] of Occupational Health - Occupational Stress Questionnaire    Feeling of Stress: To some extent  Social Connections: Socially Isolated (07/25/2023)   Social Connection and Isolation Panel    Frequency of Communication with Friends and Family: Once a week    Frequency of Social Gatherings with Friends and Family: Never    Attends Religious Services: 1 to 4 times per year    Active Member of [employer] or Organizations: No    Attends Banker Meetings: Not on file    Marital Status: Widowed    Tobacco Counseling Counseling given: Not Answered Tobacco comments: Quit remotely 1970s    Clinical Intake:  Pre-visit preparation completed: Yes  Pain : No/denies pain     Nutritional Risks: Nausea/ vomitting/ diarrhea (vomited last week) Diabetes: No  Lab Results  Component Value Date   HGBA1C 5.9 (H) 03/22/2023   HGBA1C 6.0 (H) 11/06/2022   HGBA1C 5.5 05/09/2021     How often do you need to have someone help you when you read instructions, pamphlets, or other written  materials from your doctor or pharmacy?: 1 - Never  Interpreter Needed?: No  Information entered by :: NAllen LPN   Activities of Daily Living     07/25/2023    9:06 PM 07/20/2023    3:36 PM  In your present state of health, do you have any difficulty performing the following activities:  Hearing? 1 1  Comment getting appointment to get ears checked   Vision? 0 0  Difficulty  concentrating or making decisions? 0 0  Walking or climbing stairs? 1 1  Dressing or bathing? 0 0  Doing errands, shopping? 1 1  Comment does not drive   Preparing Food and eating ? N N  Using the Toilet? N N  In the past six months, have you accidently leaked urine? Y Y  Comment incontinence   Do you have problems with loss of bowel control? Y Y  Managing your Medications? N N  Managing your Finances? N N  Housekeeping or managing your Housekeeping? N N    Patient Care Team: Early, [name], NP as PCP - General (Nurse Practitioner) Mona Vinie BROCKS, MD as PCP - Cardiology (Cardiology) Ezzard Rolin BIRCH, LCSW as VBCI Care Management (Licensed Clinical Social Worker) Morgan, Clayborne CROME, RN as VBCI Care Management Little, Clayborne CROME, RN Kit Alm LABOR, LCSW as Social Worker (Licensed Clinical Social Worker) Lionell, [name], [facility] (Pharmacist)  I have updated your Care Teams any recent Medical Services you may have received from other providers in the past year.     Assessment:   This is a routine wellness examination for Lindsay [name].  Hearing/Vision screen Hearing Screening - Comments:: States going to get ears checked Vision Screening - Comments:: Regular eye exams, [facility]   Goals Addressed             This Visit's Progress    Patient Stated       07/31/2023, wants to be able to walk better and work on endurance       Depression Screen     07/31/2023    8:25 AM 07/06/2023   10:25 AM 06/01/2023    9:22 AM 03/22/2023   11:34 AM 07/11/2022   11:27 AM 05/26/2022    9:43 AM 10/21/2020    9:48 AM  PHQ  2/9 Scores  PHQ - 2 Score 1 2 2 3  0 0 2  PHQ- 9 Score 4 5 6 6  0  11    Fall Risk     07/25/2023    9:06 PM 07/20/2023    3:36 PM 06/01/2023    9:20 AM 03/22/2023   11:33 AM 07/10/2022   11:22 AM  Fall Risk   Falls in the past year? 1 1 1 1  0  Comment not sure what happened      Number falls in past yr: 1 1 0 0 0  Injury with Fall? 0 0 1 1 0  Risk for fall due to : Impaired balance/gait;Impaired mobility;Medication side effect;History of fall(s)  History of fall(s) Impaired balance/gait Medication side effect;Impaired mobility;Impaired balance/gait  Follow up Falls prevention discussed;Falls evaluation completed  Falls prevention discussed Falls evaluation completed Falls prevention discussed;Education provided;Falls evaluation completed    MEDICARE RISK AT HOME:  Medicare Risk at Home Any stairs in or around the home?: (Patient-Rptd) No Home free of loose throw rugs in walkways, pet beds, electrical cords, etc?: (Patient-Rptd) Yes Adequate lighting in your home to reduce risk of falls?: (Patient-Rptd) Yes Use of a cane, walker or w/c?: (Patient-Rptd) Yes Shower chair or bench in shower?: (Patient-Rptd) Yes Elevated toilet seat or a handicapped toilet?: (Patient-Rptd) Yes  TIMED UP AND GO:  Was the test performed?  No  Cognitive Function: 6CIT completed        07/31/2023    8:26 AM 07/11/2022   11:28 AM  6CIT Screen  What Year? 0 points 0 points  What month? 0 points 0 points  What time? 0 points 0  points  Count back from 20 0 points 0 points  Months in reverse 0 points 0 points  Repeat phrase 0 points 0 points  Total Score 0 points 0 points    Immunizations Immunization History  Administered Date(s) Administered   DT (Pediatric) 07/23/1992, 09/09/2007   DTaP 08/06/1992   Fluad Trivalent(High Dose 65+) 10/21/2015, 10/26/2016, 10/30/2017, 09/29/2022   Fluzone Influenza virus vaccine,trivalent (IIV3), split virus 11/21/1996, 12/12/2004, 11/28/2005, 11/29/2006, 01/09/2008,  11/09/2008, 10/14/2009   Influenza Nasal 11/25/2014   Influenza Split 11/21/1996, 12/06/2004, 12/12/2004, 11/23/2005, 11/28/2005, 10/24/2006, 11/29/2006, 10/24/2007, 01/09/2008, 10/23/2008, 11/09/2008, 10/14/2009, 11/03/2009, 10/23/2012   Influenza, High Dose Seasonal PF 11/03/2012, 10/15/2013, 10/19/2013, 10/28/2018, 10/28/2018, 10/04/2019, 10/04/2019   Influenza, Seasonal, Injecte, Preservative Fre 10/25/2010   Influenza,inj,Quad PF,6+ Mos 11/10/2011   Influenza,trivalent, recombinat, inj, PF 10/21/2015, 10/26/2016, 10/30/2017   Influenza-Unspecified 11/16/1999, 11/13/2000, 11/22/2001, 12/10/2003, 10/26/2016   Moderna SARS-COV2 Booster Vaccination 02/22/2020   Moderna Sars-Covid-2 Vaccination 02/14/2019, 03/14/2019   Pneumococcal Conjugate-13 07/10/2013, 02/05/2020   Pneumococcal Polysaccharide-23 04/09/2006, 02/18/2021   Td 07/23/1992, 09/09/2007, 10/21/2020   Td (Adult),5 Lf Tetanus Toxid, Preservative Free 08/06/1992   Td (Adult),unspecified 09/09/2007   Zoster Recombinant(Shingrix) 04/12/2020, 01/04/2021   Zoster, Live 02/23/2012    Screening Tests Health Maintenance  Topic Date Due   OPHTHALMOLOGY EXAM  Never done   DEXA SCAN  Never done   COVID-19 Vaccine (3 - Moderna risk series) 07/03/2024 (Originally 03/21/2020)   INFLUENZA VACCINE  08/24/2023   HEMOGLOBIN A1C  09/19/2023   Diabetic kidney evaluation - Urine ACR  03/21/2024   FOOT EXAM  03/21/2024   Diabetic kidney evaluation - eGFR measurement  07/01/2024   Medicare Annual Wellness (AWV)  07/30/2024   DTaP/Tdap/Td (9 - Tdap) 10/22/2030   Pneumococcal Vaccine: 50+ Years  Completed   Zoster Vaccines- Shingrix  Completed   Hepatitis B Vaccines  Aged Out   HPV VACCINES  Aged Out   Meningococcal B Vaccine  Aged Out    Health Maintenance  Health Maintenance Due  Topic Date Due   OPHTHALMOLOGY EXAM  Never done   DEXA SCAN  Never done   Health Maintenance Items Addressed: Patient is prediabetic.  Additional  Screening:  Vision Screening: Recommended annual ophthalmology exams for early detection of glaucoma and other disorders of the eye. Would you like a referral to an eye doctor? No    Dental Screening: Recommended annual dental exams for proper oral hygiene  Community Resource Referral / Chronic Care Management: CRR required this visit?  No   CCM required this visit?  No   Plan:    I have personally reviewed and noted the following in the patient's chart:   Medical and social history Use of alcohol, tobacco or illicit drugs  Current medications and supplements including opioid prescriptions. Patient is not currently taking opioid prescriptions. Functional ability and status Nutritional status Physical activity Advanced directives List of other physicians Hospitalizations, surgeries, and ER visits in previous 12 months Vitals Screenings to include cognitive, depression, and falls Referrals and appointments  In addition, I have reviewed and discussed with patient certain preventive protocols, quality metrics, and best practice recommendations. A written personalized care plan for preventive services as well as general preventive health recommendations were provided to patient.   Ardella FORBES Dawn, LPN   02/24/7972   After Visit Summary: (MyChart) Due to this being a telephonic visit, the after visit summary with patients personalized plan was offered to patient via MyChart   Notes: Nothing significant to report at this time.

## 2023-07-31 NOTE — Patient Instructions (Signed)
 Ms. Lindsay [name] , Thank you for taking time out of your busy schedule to complete your Annual Wellness Visit with me. I enjoyed our conversation and look forward to speaking with you again next year. I, as well as your care team,  appreciate your ongoing commitment to your health goals. Please review the following plan we discussed and let me know if I can assist you in the future. Your Game plan/ To Do List    Referrals: If you haven't heard from the office you've been referred to, please reach out to them at the phone provided.  N/a Follow up Visits: Next Medicare AWV with our clinical staff: 08/05/2024 at 9:30   Have you seen your provider in the last 6 months (3 months if uncontrolled diabetes)? Yes Next Office Visit with your provider: 10/02/2023 at 10:30  Clinician Recommendations:  Aim for 30 minutes of exercise or brisk walking, 6-8 glasses of water, and 5 servings of fruits and vegetables each day.       This is a list of the screening recommended for you and due dates:  Health Maintenance  Topic Date Due   Eye exam for diabetics  Never done   DEXA scan (bone density measurement)  Never done   COVID-19 Vaccine (3 - Moderna risk series) 07/03/2024*   Flu Shot  08/24/2023   Hemoglobin A1C  09/19/2023   Yearly kidney health urinalysis for diabetes  03/21/2024   Complete foot exam   03/21/2024   Yearly kidney function blood test for diabetes  07/01/2024   Medicare Annual Wellness Visit  07/30/2024   DTaP/Tdap/Td vaccine (9 - Tdap) 10/22/2030   Pneumococcal Vaccine for age over 30  Completed   Zoster (Shingles) Vaccine  Completed   Hepatitis B Vaccine  Aged Out   HPV Vaccine  Aged Out   Meningitis B Vaccine  Aged Out  *Topic was postponed. The date shown is not the original due date.    Advanced directives: (Copy Requested) Please bring a copy of your health care power of attorney and living will to the office to be added to your chart at your convenience. You can mail to [facility] [address] [place], [place] [address] or email to [email or web address] Advance Care Planning is important because it:  [x]  Makes sure you receive the medical care that is consistent with your values, goals, and preferences  [x]  It provides guidance to your family and loved ones and reduces their decisional burden about whether or not they are making the right decisions based on your wishes.  Follow the link provided in your after visit summary or read over the paperwork we have mailed to you to help you started getting your Advance Directives in place. If you need assistance in completing these, please reach out to us  so that we can help you!  See attachments for Preventive Care and Fall Prevention Tips.

## 2023-08-02 DIAGNOSIS — L600 Ingrowing nail: Secondary | ICD-10-CM | POA: Diagnosis not present

## 2023-08-02 DIAGNOSIS — B351 Tinea unguium: Secondary | ICD-10-CM | POA: Diagnosis not present

## 2023-08-02 DIAGNOSIS — I739 Peripheral vascular disease, unspecified: Secondary | ICD-10-CM | POA: Diagnosis not present

## 2023-08-02 DIAGNOSIS — M79671 Pain in right foot: Secondary | ICD-10-CM | POA: Diagnosis not present

## 2023-08-03 DIAGNOSIS — M62511 Muscle wasting and atrophy, not elsewhere classified, right shoulder: Secondary | ICD-10-CM | POA: Diagnosis not present

## 2023-08-03 DIAGNOSIS — R278 Other lack of coordination: Secondary | ICD-10-CM | POA: Diagnosis not present

## 2023-08-03 DIAGNOSIS — M62512 Muscle wasting and atrophy, not elsewhere classified, left shoulder: Secondary | ICD-10-CM | POA: Diagnosis not present

## 2023-08-03 DIAGNOSIS — R296 Repeated falls: Secondary | ICD-10-CM | POA: Diagnosis not present

## 2023-08-08 DIAGNOSIS — M62511 Muscle wasting and atrophy, not elsewhere classified, right shoulder: Secondary | ICD-10-CM | POA: Diagnosis not present

## 2023-08-08 DIAGNOSIS — M62512 Muscle wasting and atrophy, not elsewhere classified, left shoulder: Secondary | ICD-10-CM | POA: Diagnosis not present

## 2023-08-08 DIAGNOSIS — R296 Repeated falls: Secondary | ICD-10-CM | POA: Diagnosis not present

## 2023-08-08 DIAGNOSIS — R278 Other lack of coordination: Secondary | ICD-10-CM | POA: Diagnosis not present

## 2023-08-09 ENCOUNTER — Other Ambulatory Visit

## 2023-08-09 DIAGNOSIS — Z79899 Other long term (current) drug therapy: Secondary | ICD-10-CM

## 2023-08-09 NOTE — Progress Notes (Signed)
 08/09/2023 Name: Lindsay [name] MRN: [id number] DOB: 03/02/1940  Chief Complaint  Patient presents with   Medication Management    Lindsay [name] is a 83 y.o. year old female who presented for a telephone visit.   They were referred to the pharmacist by their PCP for assistance in managing complex medication management.    Subjective:  Care Team: Primary Care Provider: Oris [name], NP ; Next Scheduled Visit: 10/02/23  Medication Access/Adherence  Current Pharmacy:  [facility] - [place], [place] - [address] [place] [address] Phone: [phone] Fax: [phone]  [facility] - [place], [place] - [address] [place] [address] Phone: [phone] Fax: [phone]   Patient reports affordability concerns with their medications: Yes  - Eliquis , PAP denied right now until patient reaches OOP, patient will reach out if samples needed or to discuss alternatives. Will continue to monitor at follow ups Patient reports access/transportation concerns to their pharmacy: No  Patient reports adherence concerns with their medications:  Yes  - states pharmacy had been leaving out her sertraline  in error, is taking now   Medication Management: -Patient has resumed sertaline 100mg  at bedtime and it is in her pill packaging. Reports she is still feeling anxious but had not increased dose yet like PCP had recommended at previous visit.--Patient still has not increased dose, she forgot -Still not taking mirtazapine  either, did not want to add or increase too many things at once -Getting eliquis  okay at this time, will let us  know if this changes -Prefers to stay with current pharmacy at this time   Objective:  Lab Results  Component Value Date   HGBA1C 5.9 (H) 03/22/2023    Lab Results  Component Value Date   CREATININE 1.20 (H) 07/02/2023   BUN 19 07/02/2023   NA 142 07/02/2023   K 4.0 07/02/2023   CL  98 07/02/2023   CO2 27 07/02/2023    Lab Results  Component Value Date   CHOL 155 07/02/2023   HDL 58 07/02/2023   LDLCALC 83 07/02/2023   LDLDIRECT 108 (H) 06/30/2022   TRIG 75 07/02/2023   CHOLHDL 2.7 07/02/2023    Medications Reviewed Today     Reviewed by Lionell [name], RPH (Pharmacist) on 08/09/23 at 1122  Med List Status: <None>   Medication Order Taking? Sig Documenting Provider Last Dose Status Informant  acetaminophen  (TYLENOL ) 500 MG tablet [id number]  Take 2 tablets (1,000 mg total) by mouth in the morning, at noon, and at bedtime. [name], NP  Active   alendronate  (FOSAMAX ) 70 MG tablet [id number]  TAKE 1 TABLET BY MOUTH EVERY SUNDAY Early, [name], NP  Active   apixaban  (ELIQUIS ) 5 MG TABS tablet [id number]  Take 1 tablet (5 mg total) by mouth 2 (two) times daily. Mona Vinie BROCKS, MD  Active Self  atorvastatin  (LIPITOR) 40 MG tablet [id number]  Take 1 tablet (40 mg total) by mouth daily. Early, [name], NP  Expired 07/31/23 2359   Calcium -Magnesium -Vitamin D  (CALCIUM  1200+D3 PO) [id number]  Take 1 tablet by mouth in the morning. [provider]  Active Self  cetirizine  (ZYRTEC ) 10 MG tablet [id number]  TAKE 1 TABLET BY MOUTH EVERY DAY Dewald, Jonathan B, MD  Active Self  Cyanocobalamin  (B-12) 1000 MCG SUBL [id number]  Place 1 tablet under the tongue daily at 6 (six) AM. Early, [name], NP  Active Self  Elastic Bandages &  Supports (MEDICAL COMPRESSION STOCKINGS) MISC [id number]  Two pair knee high 30-59mmHg compression stockings. Sized XL. As covered by insurance for ICD-10: I50.33, R60.0. Early, [name], NP  Active Self           Med Note (GANN, RILEY C   Mon Jul 02, 2023  1:52 PM) Wants to check on it.   fluticasone  (FLONASE) 50 MCG/ACT nasal spray [id number]  Place 1 spray into both nostrils 2 (two) times daily as needed for allergies or rhinitis. [provider]  Active Self  Fluticasone -Umeclidin-Vilant (TRELEGY ELLIPTA ) 100-62.5-25 MCG/ACT AEPB  [id number]  Inhale 1 puff into the lungs daily. [name], MD  Active Self  ipratropium-albuterol  (DUONEB) 0.5-2.5 (3) MG/3ML SOLN [id number]  Inhale 3 mLs into the lungs every 6 (six) hours as needed (wheezing). [name], MD  Active Self  LORazepam  (ATIVAN ) 1 MG tablet [id number]  TAKE 1 TABLET BY MOUTH 2 TIMES DAILY AS NEEDED FOR ANXIETY Early, [name], NP  Active   Menthol (COUGH DROPS) 5.8 MG LOZG [id number]  Use as directed 1 lozenge (5.8 mg total) in the mouth or throat as needed (for dry, sore, or scratchy throat). Early, [name], NP  Active Self  metoprolol  tartrate (LOPRESSOR ) 25 MG tablet [id number]  TAKE 1 TABLET BY MOUTH 2 TIMES DAILY Early, [name], NP  Active   mirtazapine  (REMERON ) 45 MG tablet [id number]  Take 1 tablet (45 mg total) by mouth at bedtime as needed (sleep). Early, [name], NP  Active            Med Note MAPLE, CARLO BROCKS   Mon Jul 02, 2023  1:56 PM) Told pharmacy she does not need and she does  montelukast (SINGULAIR) 10 MG tablet [id number]  Take 10 mg by mouth at bedtime. [provider]  Active Self  Multiple Vitamin (MULTIVITAMIN WITH MINERALS) TABS tablet [id number]  Take 1 tablet by mouth daily with breakfast. [provider]  Active Self  OXYGEN  [id number]  Inhale 2 L/min into the lungs continuous. [provider]  Active Self  pantoprazole  (PROTONIX ) 20 MG tablet [id number]  TAKE 1 TABLET BY MOUTH ONCE DAILY Early, [name], NP  Active   sertraline  (ZOLOFT ) 100 MG tablet [id number]  TAKE 1 TABLET BY MOUTH AT BEDTIME Early, [name], NP  Active   Spacer/Aero-Holding Chambers (AEROCHAMBER MV) inhaler [id number]  Use as instructed  Patient not taking: Reported on 07/31/2023   [name], MD  Active Self  spironolactone  (ALDACTONE ) 25 MG tablet [id number]  Take 0.5 tablets (12.5 mg total) by mouth daily. [name], NP  Active   spironolactone  (ALDACTONE ) 25 MG tablet [id number]  TAKE A HALF TABLET DAILY AS NEEDED FOR EDEMA OR FLUID  RETENTION  Patient not taking: Reported on 07/31/2023   [name], NP  Active   torsemide  (DEMADEX ) 20 MG tablet [id number]  TAKE 2 TABLETS DAILY Walker, [name], NP  Active   Vitamin D , Ergocalciferol , (DRISDOL ) 1.25 MG (50000 UNIT) CAPS capsule [id number]  Take 1 capsule (50,000 Units total) by mouth every 7 (seven) days. Early, [name], NP  Active               Assessment/Plan:   Medication Management: -Reviewed medication list and confirmed patient is taking everything as prescribed at this time, with the exception of the mirtazapine  -Starting tonight, patient will increase to sertraline  100mg  1 and 1/2 tabs at bedtime as previously instructed by PCP. At f/u, will coordinate  rx with updated instructions if needed. Consider restarting mirtazapine  at that time if needed. -Made patient aware that other packaging pharmacies are in the area, patient elects to stay with current pharmacy for now. -Recommend monthly calls with patient to monitor pill packaging and to ensure all meds are being included, patient agrees    Follow Up Plan: 1 week  Jon VEAR Lindau, PharmD Clinical Pharmacist [phone]

## 2023-08-11 DIAGNOSIS — J449 Chronic obstructive pulmonary disease, unspecified: Secondary | ICD-10-CM | POA: Diagnosis not present

## 2023-08-11 DIAGNOSIS — R0602 Shortness of breath: Secondary | ICD-10-CM | POA: Diagnosis not present

## 2023-08-14 DIAGNOSIS — R278 Other lack of coordination: Secondary | ICD-10-CM | POA: Diagnosis not present

## 2023-08-14 DIAGNOSIS — M62511 Muscle wasting and atrophy, not elsewhere classified, right shoulder: Secondary | ICD-10-CM | POA: Diagnosis not present

## 2023-08-14 DIAGNOSIS — M62512 Muscle wasting and atrophy, not elsewhere classified, left shoulder: Secondary | ICD-10-CM | POA: Diagnosis not present

## 2023-08-14 DIAGNOSIS — R296 Repeated falls: Secondary | ICD-10-CM | POA: Diagnosis not present

## 2023-08-14 NOTE — Telephone Encounter (Signed)
 Gave pt a call to follow up on pap bristol Myers to see if pt is ready to send application and to see if she has met $600 deductible,pt has not answer call or return calls, I will let pt call at her earliest convenience.

## 2023-08-16 ENCOUNTER — Other Ambulatory Visit

## 2023-08-16 ENCOUNTER — Telehealth: Payer: Self-pay

## 2023-08-16 NOTE — Progress Notes (Signed)
   08/16/2023  Patient ID: Lindsay [name], female   DOB: May 09, 1940, 83 y.o.   MRN: [id number]  Attempted to contact patient for scheduled appointment for medication management. Left HIPAA compliant message for patient to return my call at their convenience.   Jon VEAR Lindau, PharmD Clinical Pharmacist [phone]

## 2023-08-17 ENCOUNTER — Other Ambulatory Visit: Payer: Self-pay

## 2023-08-17 ENCOUNTER — Telehealth: Payer: Self-pay

## 2023-08-17 DIAGNOSIS — J4540 Moderate persistent asthma, uncomplicated: Secondary | ICD-10-CM

## 2023-08-17 MED ORDER — TRELEGY ELLIPTA 100-62.5-25 MCG/ACT IN AEPB: 1.0000 | INHALATION_SPRAY | Freq: Every day | RESPIRATORY_TRACT | 3 refills | Status: DC

## 2023-08-17 NOTE — Telephone Encounter (Signed)
 Copied from CRM [id number]. Topic: Clinical - Prescription Issue >> Aug 15, 2023  3:43 PM Isabell A wrote: Reason for CRM: Patient states for Trelegy she is using a company that is helping her get it cheaper - Requesting a new prescription to be faxed to [facility] [phone].     LM- message foer patient ok per HIPAA    Faxed new RX in    NFN-

## 2023-08-22 ENCOUNTER — Telehealth: Payer: Self-pay

## 2023-08-22 DIAGNOSIS — M62511 Muscle wasting and atrophy, not elsewhere classified, right shoulder: Secondary | ICD-10-CM | POA: Diagnosis not present

## 2023-08-22 DIAGNOSIS — M62512 Muscle wasting and atrophy, not elsewhere classified, left shoulder: Secondary | ICD-10-CM | POA: Diagnosis not present

## 2023-08-22 DIAGNOSIS — R296 Repeated falls: Secondary | ICD-10-CM | POA: Diagnosis not present

## 2023-08-22 DIAGNOSIS — R278 Other lack of coordination: Secondary | ICD-10-CM | POA: Diagnosis not present

## 2023-08-22 NOTE — Progress Notes (Unsigned)
 Complex Care Management Care Guide Note  08/22/2023 Name: Lindsay [name] MRN: [id number] DOB: [birth year]  Lindsay [name] is a 83 y.o. year old female who is a primary care patient of Early, [name], NP and is actively engaged with the care management team. I reached out to Dagoberto Pass by phone today to assist with re-scheduling  with the Pharmacist.  Follow up plan: Unsuccessful telephone outreach attempt made. A HIPAA compliant phone message was left for the patient providing contact information and requesting a return call.  Leotis Rase [facility], [facility] Guide  Direct Dial: [phone]  Fax [phone]

## 2023-08-23 ENCOUNTER — Other Ambulatory Visit (HOSPITAL_BASED_OUTPATIENT_CLINIC_OR_DEPARTMENT_OTHER): Payer: Self-pay | Admitting: Nurse Practitioner

## 2023-08-23 DIAGNOSIS — M810 Age-related osteoporosis without current pathological fracture: Secondary | ICD-10-CM

## 2023-08-23 DIAGNOSIS — F411 Generalized anxiety disorder: Secondary | ICD-10-CM

## 2023-08-23 DIAGNOSIS — F3341 Major depressive disorder, recurrent, in partial remission: Secondary | ICD-10-CM

## 2023-08-23 NOTE — Progress Notes (Signed)
 Complex Care Management Care Guide Note  08/23/2023 Name: Lindsay [name] MRN: [id number] DOB: [birth year]  Lindsay [name] is a 83 y.o. year old female who is a primary care patient of Early, [name], NP and is actively engaged with the care management team. I reached out to Dagoberto Pass by phone today to assist with re-scheduling  with the Pharmacist.  Follow up plan: Unsuccessful telephone outreach attempt made. A HIPAA compliant phone message was left for the patient providing contact information and requesting a return call.  Leotis Rase [facility], [facility] Guide  Direct Dial: [phone]  Fax [phone]

## 2023-08-24 ENCOUNTER — Ambulatory Visit: Payer: Self-pay | Admitting: Pulmonary Disease

## 2023-08-24 DIAGNOSIS — J4541 Moderate persistent asthma with (acute) exacerbation: Secondary | ICD-10-CM

## 2023-08-24 NOTE — Telephone Encounter (Signed)
 FYI Only or Action Required?: Action required by provider: Refusing disposition due to transportation, requesting meds/further recommendations, needs call back today.  Patient is followed in Pulmonology for COPD and chronic respiratory failure, last seen on 02/28/2023 by [name], MD.  Called Nurse Triage reporting Cough, Nasal Congestion, Headache, Shortness of Breath, low oxygen  levels, chest weakness, and Wheezing.  Symptoms began several days ago.  Interventions attempted: Maintenance inhaler and Home oxygen  use. Advised use albuterol  nebulizer since not yet tried.  Symptoms are: gradually worsening.  Triage Disposition: See HCP Within 4 Hours (Or PCP Triage)  Patient/caregiver understands and will follow disposition?: No, refuses disposition      Copied from CRM #[id number]. Topic: Clinical - Red Word Triage >> Aug 24, 2023  9:05 AM Joesph PARAS wrote: Red Word that prompted transfer to Nurse Triage: COUGHING, CLEAR CONGESTION, HEADACHE, feels cannot catch breath but o2 sats on 88 Reason for Disposition  [1] Longstanding difficulty breathing (e.g., CHF, COPD, emphysema) AND [2] WORSE than normal  Answer Assessment - Initial Assessment Questions E2C2 Pulmonary Triage - Initial Assessment Questions Chief Complaint (e.g., cough, sob, wheezing, fever, chills, sweat or additional symptoms) *Go to specific symptom protocol after initial questions. More SOB than her usual, SOB most of the time but shorter than normal now, after have been coughing Coughing a lot, coughing fits, coughing up light color sputum more like gray I guess, no blood Don't feel that bad but don't feel right Lot of times doc will call me in script sometimes antibx sometimes steroid, just wanted to get message to him and see what he thinks Headache 3 days in row woke up by headache, head hurt really really bad, first day 7/10, second day terrible more of a 10/10 had to lay back down and close eyes a while,  yesterday back to 7/10, today don't hurt Dizziness when coughing and when first stand up, not stumbling No fever Just a slight chest pain when cough like I do, chest just feels weak more than a pain, feel other times as well, feeling does not last longer than 5 min at a time, not different but worse Wheezing a little bit but not this morning Runny nose Feels about the same in terms of weakness Speaking in full sentences  How long have symptoms been present? Last 4 days counting today  MEDICINES:   Have you used any OTC meds to help with symptoms? Yes If yes, ask What medications? Extra strength tylenol , helps some but don't make it go away with headache  Have you used your inhalers/maintenance medication? Yes If yes, What medications? Trelegy 1x/day Don't have another inhaler Have nebulizer but not used it albuterol   OXYGEN : Do you wear supplemental oxygen ? Yes If yes, How many liters are you supposed to use? Use my oxygen  all the time, but don't have supplementary, at 2L  Do you monitor your oxygen  levels? Yes If yes, What is your reading (oxygen  level) today? 90% 83 bpm, 85-93%  What is your usual oxygen  saturation reading?  (Note: Pulmonary O2 sats should be 90% or greater) Around 90-92%  6. CARDIAC HISTORY: Do you have any history of heart disease? (e.g., heart attack, angina, bypass surgery, angioplasty)      significant  7. LUNG HISTORY: Do you have any history of lung disease?  (e.g., pulmonary embolus, asthma, emphysema)     Significant   Advised pt be examined today, pt refusing due to transportation issues, pt requesting further recommendations/meds from pulm. Advised pt  use her albuterol  nebulizer since not yet tried. Advised that sending message to pulm with request for pt to receive call back today. Advised pt get to hospital if any worsening or new symptoms.  Protocols used: Breathing Difficulty-A-AH

## 2023-08-24 NOTE — Telephone Encounter (Signed)
 Dr. [name], please advise.

## 2023-08-26 MED ORDER — AZITHROMYCIN 250 MG PO TABS: ORAL_TABLET | ORAL | 0 refills | Status: DC

## 2023-08-26 MED ORDER — PREDNISONE 10 MG PO TABS: ORAL_TABLET | ORAL | 0 refills | Status: AC

## 2023-08-26 NOTE — Telephone Encounter (Signed)
 Steroids and zpak sent in.  jD

## 2023-08-27 NOTE — Telephone Encounter (Signed)
 Called and spoke with patient, advised her that Dr. Kara had sent in Prednisone  and a Zpack to [facility].  I let her know to call us  and let us  know if she is not feeling better after she completes the antibiotic and steroid.  She verbalized understanding.  Nothing further needed.

## 2023-08-28 ENCOUNTER — Other Ambulatory Visit: Payer: Self-pay

## 2023-08-28 ENCOUNTER — Ambulatory Visit: Payer: Self-pay | Admitting: Pulmonary Disease

## 2023-08-28 ENCOUNTER — Telehealth: Payer: Self-pay

## 2023-08-28 DIAGNOSIS — Z09 Encounter for follow-up examination after completed treatment for conditions other than malignant neoplasm: Secondary | ICD-10-CM

## 2023-08-28 DIAGNOSIS — D518 Other vitamin B12 deficiency anemias: Secondary | ICD-10-CM

## 2023-08-28 MED ORDER — B-12 1000 MCG SL SUBL: 1.0000 | SUBLINGUAL_TABLET | Freq: Every day | SUBLINGUAL | 3 refills | Status: DC

## 2023-08-28 NOTE — Telephone Encounter (Signed)
 I recommend reaching out to Dr. Luann office to clarify the azithromycin  prescription. This medication is usually taken 2 tablets on the first day and 1 tablet each additional day until pack is completed.   She can hold the B12 vitamin for now and we can recheck with her next set of labs.  I recommend she contact the pharmacy for a refund of the script.

## 2023-08-28 NOTE — Telephone Encounter (Signed)
 Spoke with patient regarding prior message . Advised patient to take 2 tablet's 500mg  the first day and rvry day after 1 tablet 250mg  until finish .   Patient's voice was understanding.Nothing else further needed.

## 2023-08-28 NOTE — Telephone Encounter (Signed)
 FYI Only or Action Required?: Action required by provider: clinical question for provider.  Patient is followed in Pulmonology for COPD, last seen on 02/28/2023 by [name], MD.  Called Nurse Triage reporting Medication Problem.   Triage Disposition: Call Specialist Now  Patient/caregiver understands and will follow disposition?: Yes      Copied from CRM [id number]. Topic: Clinical - Medication Question >> Aug 28, 2023  8:32 AM Nathanel DEL wrote: Reason for CRM: pt got Rx yesterday from Dr Kara,  azithromycin  (ZITHROMAX ) 250 MG tablet. But no directions anywhere.  Please advise on what pt can do. Reason for Disposition  [1] Caller has URGENT medicine question about med that primary care doctor (or NP/PA) or specialist prescribed AND [2] triager unable to answer question  Answer Assessment - Initial Assessment Questions 1. NAME of MEDICINE: What medicine(s) are you calling about?     azithromycin  (ZITHROMAX ) 250 MG 2. QUESTION: What is your question? (e.g., double dose of medicine, side effect)     Pt reports does not have instructions on how to take Rx 3. PRESCRIBER: Who prescribed the medicine? Reason: if prescribed by specialist, call should be referred to that group.     [name], MD 4. SYMPTOMS: Do you have any symptoms? If Yes, ask: What symptoms are you having?  How bad are the symptoms (e.g., mild, moderate, severe)     N/a 5. PREGNANCY:  Is there any chance that you are pregnant? When was your last menstrual period?     N/a    Triager unable to find additional SIG for azithromycin  (ZITHROMAX ) 250 MG  Triager will forward encounter for Dr. Kara 's office to review and advise. Patient verbalized understanding and is expecting call back from office for next steps.  Protocols used: Medication Question Call-A-AH

## 2023-08-28 NOTE — Telephone Encounter (Signed)
 Sent in a new script for the pts. B-12. Per pt. The pharmacy charged her for it but it wasn't in her pill pack.   Copied from CRM [id number]. Topic: Clinical - Medication Question >> Aug 28, 2023  8:32 AM Nathanel DEL wrote: Reason for CRM: pt got Rx yesterday from Dr Kara,  azithromycin  (ZITHROMAX ) 250 MG tablet. But no directions anywhere.  Please advise on what pt can do. >> Aug 28, 2023  2:48 PM Donna E wrote: Patient calling, stating Cyanocobalamin  (B-12) 1000 MCG SUBL  was not put in pill packed and pharmacy still charged patient for medication. Patient is wondering if she should take this medication. Patient would like a nurse to call her back.  Patient phone [phone]

## 2023-08-29 ENCOUNTER — Telehealth: Payer: Self-pay

## 2023-08-29 DIAGNOSIS — J9621 Acute and chronic respiratory failure with hypoxia: Secondary | ICD-10-CM

## 2023-08-29 DIAGNOSIS — R296 Repeated falls: Secondary | ICD-10-CM | POA: Diagnosis not present

## 2023-08-29 DIAGNOSIS — J441 Chronic obstructive pulmonary disease with (acute) exacerbation: Secondary | ICD-10-CM

## 2023-08-29 DIAGNOSIS — R278 Other lack of coordination: Secondary | ICD-10-CM | POA: Diagnosis not present

## 2023-08-29 DIAGNOSIS — M62511 Muscle wasting and atrophy, not elsewhere classified, right shoulder: Secondary | ICD-10-CM | POA: Diagnosis not present

## 2023-08-29 DIAGNOSIS — M62512 Muscle wasting and atrophy, not elsewhere classified, left shoulder: Secondary | ICD-10-CM | POA: Diagnosis not present

## 2023-08-29 MED ORDER — IPRATROPIUM-ALBUTEROL 0.5-2.5 (3) MG/3ML IN SOLN: 3.0000 mL | Freq: Four times a day (QID) | RESPIRATORY_TRACT | 5 refills | Status: DC | PRN

## 2023-08-29 NOTE — Telephone Encounter (Signed)
 Copied from CRM [id number]. Topic: Clinical - Medication Question >> Aug 29, 2023  9:30 AM Russell PARAS wrote: Reason for CRM:   Pt was prescribed azithromycin  (ZITHROMAX ) 250 MG tablet by Dr. Kara. However, when she received the medication, there were no clear directions on how often to take or for how many days.   Requesting call back for clarification of directions  CB#  [phone]   I called and spoke to pt. The directions should be 2 tablets the first day, 1 tablet there after. Pt verbalized understanding. Pt states when she continued to wheeze, she was suppose to use her nebulizer but it has expired. She uses the Duoneb. I informed pt that she should not take an expired medication and I am able to send in a refill for her. Pt verbalized understanding. I also got pt an upcoming appointment with Dr Kara for September 26, 2023. NFN

## 2023-08-30 ENCOUNTER — Other Ambulatory Visit (INDEPENDENT_AMBULATORY_CARE_PROVIDER_SITE_OTHER)

## 2023-08-30 ENCOUNTER — Telehealth: Payer: Self-pay

## 2023-08-30 DIAGNOSIS — F3341 Major depressive disorder, recurrent, in partial remission: Secondary | ICD-10-CM

## 2023-08-30 DIAGNOSIS — F411 Generalized anxiety disorder: Secondary | ICD-10-CM

## 2023-08-30 MED ORDER — SERTRALINE HCL 100 MG PO TABS: 150.0000 mg | ORAL_TABLET | Freq: Every day | ORAL | 2 refills | Status: DC

## 2023-08-30 NOTE — Progress Notes (Signed)
   08/30/2023 Name: Cyniah Gossard MRN: [id number] DOB: 1940-11-20  Chief Complaint  Patient presents with   Medication Management    Ashia Dehner is a 83 y.o. year old female who presented for a telephone visit.   They were referred to the pharmacist by their PCP for assistance in managing complex medication management.    Subjective:  Care Team: Primary Care Provider: Oris [name], NP ; Next Scheduled Visit: 10/02/23  Medication Access/Adherence  Current Pharmacy:  [facility] - [place], [place] - [address] [place] [address] Phone: [phone] Fax: [phone]  [facility] - [place], [place] - [address] [place] [address] Phone: [phone] Fax: [phone]   Patient reports affordability concerns with their medications: Yes  - Eliquis , PAP denied  Patient reports access/transportation concerns to their pharmacy: No  Patient reports adherence concerns with their medications:  Yes  - states pharmacy had been leaving out her sertraline  in error, is taking now   Medication Management: -Patient has started taking sertraline  150mg  daily as recommended by PCP and reports feeling much better, sleeping well -Still not taking mirtazapine , feels it is not needed at this time -Struggling to get eliquis , has been managing  Objective:  Lab Results  Component Value Date   HGBA1C 5.9 (H) 03/22/2023    Lab Results  Component Value Date   CREATININE 1.20 (H) 07/02/2023   BUN 19 07/02/2023   NA 142 07/02/2023   K 4.0 07/02/2023   CL 98 07/02/2023   CO2 27 07/02/2023    Lab Results  Component Value Date   CHOL 155 07/02/2023   HDL 58 07/02/2023   LDLCALC 83 07/02/2023   LDLDIRECT 108 (H) 06/30/2022   TRIG 75 07/02/2023   CHOLHDL 2.7 07/02/2023    Medications Reviewed Today   Medications were not reviewed in this encounter       Assessment/Plan:   Medication  Management: -Continue sertraline  150mg  nightly, sending in order to reflect dose change -Patient may qualify for xarelto PAP, sending in mail    Follow Up Plan: 2 weeks  Jon VEAR Lindau, PharmD Clinical Pharmacist [phone]

## 2023-08-30 NOTE — Telephone Encounter (Signed)
 Gave Lindsay [name] a call to let her know she will be receiving PAP [employer](Xarelto) in a couple of days,spoke with Lindsay [name] and is aware and if not received she will call back by next week.

## 2023-09-03 ENCOUNTER — Telehealth: Payer: Self-pay

## 2023-09-03 ENCOUNTER — Other Ambulatory Visit: Payer: Self-pay

## 2023-09-03 DIAGNOSIS — J302 Other seasonal allergic rhinitis: Secondary | ICD-10-CM

## 2023-09-03 DIAGNOSIS — J4521 Mild intermittent asthma with (acute) exacerbation: Secondary | ICD-10-CM

## 2023-09-03 NOTE — Telephone Encounter (Signed)
 Please call to patient about [facility] pharmacy requesting a Rx ( not pharmacy list ) wanted to check first before placing order

## 2023-09-04 ENCOUNTER — Other Ambulatory Visit (HOSPITAL_BASED_OUTPATIENT_CLINIC_OR_DEPARTMENT_OTHER): Payer: Self-pay | Admitting: Nurse Practitioner

## 2023-09-04 ENCOUNTER — Other Ambulatory Visit: Payer: Self-pay

## 2023-09-04 ENCOUNTER — Telehealth: Payer: Self-pay

## 2023-09-04 ENCOUNTER — Telehealth: Payer: Self-pay | Admitting: Family Medicine

## 2023-09-04 DIAGNOSIS — F411 Generalized anxiety disorder: Secondary | ICD-10-CM

## 2023-09-04 DIAGNOSIS — E1169 Type 2 diabetes mellitus with other specified complication: Secondary | ICD-10-CM

## 2023-09-04 MED ORDER — ATORVASTATIN CALCIUM 40 MG PO TABS
40.0000 mg | ORAL_TABLET | Freq: Every day | ORAL | 1 refills | Status: DC
Start: 2023-09-04 — End: 2023-10-24

## 2023-09-04 NOTE — Telephone Encounter (Signed)
 Copied from CRM [id number]. Topic: Clinical - Medication Question >> Sep 04, 2023  8:09 AM Isabell A wrote: Reason for CRM: Patient is calling to confirm the pharmacy she would like her medication to - states mail order is ok.  I called and spoke to pt. Pt states she already picked up her medication, but she wanted to make sure her medications would be sent to [facility] moving forward. I informed pt that I could pout this pharmacy in for her, pt verbalized understanding. NFN

## 2023-09-04 NOTE — Telephone Encounter (Signed)
 [facility] pharm req refill on Atorvastatin  40 mg tab

## 2023-09-04 NOTE — Telephone Encounter (Signed)
 LAST APPT. 07/02/23

## 2023-09-05 ENCOUNTER — Other Ambulatory Visit: Payer: Self-pay | Admitting: Nurse Practitioner

## 2023-09-05 DIAGNOSIS — M62512 Muscle wasting and atrophy, not elsewhere classified, left shoulder: Secondary | ICD-10-CM | POA: Diagnosis not present

## 2023-09-05 DIAGNOSIS — F3341 Major depressive disorder, recurrent, in partial remission: Secondary | ICD-10-CM

## 2023-09-05 DIAGNOSIS — M62511 Muscle wasting and atrophy, not elsewhere classified, right shoulder: Secondary | ICD-10-CM | POA: Diagnosis not present

## 2023-09-05 DIAGNOSIS — R278 Other lack of coordination: Secondary | ICD-10-CM | POA: Diagnosis not present

## 2023-09-05 DIAGNOSIS — R296 Repeated falls: Secondary | ICD-10-CM | POA: Diagnosis not present

## 2023-09-05 MED ORDER — MIRTAZAPINE 45 MG PO TABS: 45.0000 mg | ORAL_TABLET | Freq: Every evening | ORAL | 3 refills | Status: DC | PRN

## 2023-09-05 NOTE — Progress Notes (Signed)
 Paperwork completed for Eliquis  patient assistance.

## 2023-09-06 NOTE — Progress Notes (Signed)
 Hello! Spoke with Ms Tymeshia and she understands that the xarelto would replace the Eliquis  if she gets approved. She is completing her paperwork today and reports it should go out in her mail tomorrow.  Thank you! Jon VEAR Lindau, PharmD Clinical Pharmacist [phone]

## 2023-09-08 ENCOUNTER — Encounter: Payer: Self-pay | Admitting: Nurse Practitioner

## 2023-09-08 ENCOUNTER — Encounter (HOSPITAL_BASED_OUTPATIENT_CLINIC_OR_DEPARTMENT_OTHER): Payer: Self-pay

## 2023-09-08 DIAGNOSIS — F411 Generalized anxiety disorder: Secondary | ICD-10-CM

## 2023-09-10 ENCOUNTER — Other Ambulatory Visit: Payer: Self-pay

## 2023-09-10 DIAGNOSIS — Z09 Encounter for follow-up examination after completed treatment for conditions other than malignant neoplasm: Secondary | ICD-10-CM

## 2023-09-10 DIAGNOSIS — F3341 Major depressive disorder, recurrent, in partial remission: Secondary | ICD-10-CM

## 2023-09-10 DIAGNOSIS — J4521 Mild intermittent asthma with (acute) exacerbation: Secondary | ICD-10-CM

## 2023-09-10 DIAGNOSIS — F411 Generalized anxiety disorder: Secondary | ICD-10-CM

## 2023-09-10 DIAGNOSIS — M62511 Muscle wasting and atrophy, not elsewhere classified, right shoulder: Secondary | ICD-10-CM | POA: Diagnosis not present

## 2023-09-10 DIAGNOSIS — M810 Age-related osteoporosis without current pathological fracture: Secondary | ICD-10-CM

## 2023-09-10 DIAGNOSIS — J302 Other seasonal allergic rhinitis: Secondary | ICD-10-CM

## 2023-09-10 DIAGNOSIS — R278 Other lack of coordination: Secondary | ICD-10-CM | POA: Diagnosis not present

## 2023-09-10 DIAGNOSIS — D518 Other vitamin B12 deficiency anemias: Secondary | ICD-10-CM

## 2023-09-10 DIAGNOSIS — M62512 Muscle wasting and atrophy, not elsewhere classified, left shoulder: Secondary | ICD-10-CM | POA: Diagnosis not present

## 2023-09-10 DIAGNOSIS — R296 Repeated falls: Secondary | ICD-10-CM | POA: Diagnosis not present

## 2023-09-10 MED ORDER — SPIRONOLACTONE 25 MG PO TABS: ORAL_TABLET | ORAL | 0 refills | Status: DC

## 2023-09-10 MED ORDER — SERTRALINE HCL 100 MG PO TABS
150.0000 mg | ORAL_TABLET | Freq: Every day | ORAL | 0 refills | Status: DC
Start: 2023-09-10 — End: 2023-10-24

## 2023-09-10 MED ORDER — ALENDRONATE SODIUM 70 MG PO TABS
70.0000 mg | ORAL_TABLET | ORAL | 1 refills | Status: DC
Start: 2023-09-10 — End: 2023-10-24

## 2023-09-10 MED ORDER — PANTOPRAZOLE SODIUM 20 MG PO TBEC: 20.0000 mg | DELAYED_RELEASE_TABLET | Freq: Every day | ORAL | 1 refills | Status: DC

## 2023-09-10 MED ORDER — B-12 1000 MCG SL SUBL: 1.0000 | SUBLINGUAL_TABLET | Freq: Every day | SUBLINGUAL | 1 refills | Status: DC

## 2023-09-10 MED ORDER — METOPROLOL TARTRATE 25 MG PO TABS: 25.0000 mg | ORAL_TABLET | Freq: Two times a day (BID) | ORAL | 1 refills | Status: DC

## 2023-09-10 MED ORDER — CETIRIZINE HCL 10 MG PO TABS: 10.0000 mg | ORAL_TABLET | Freq: Every day | ORAL | 1 refills | Status: DC

## 2023-09-11 DIAGNOSIS — J449 Chronic obstructive pulmonary disease, unspecified: Secondary | ICD-10-CM | POA: Diagnosis not present

## 2023-09-11 DIAGNOSIS — R0602 Shortness of breath: Secondary | ICD-10-CM | POA: Diagnosis not present

## 2023-09-12 MED ORDER — LORAZEPAM 1 MG PO TABS: 1.0000 mg | ORAL_TABLET | Freq: Two times a day (BID) | ORAL | 2 refills | Status: DC

## 2023-09-12 NOTE — Telephone Encounter (Signed)
 Received pt portion of application [employer] ,pt did not mail out proof of income or pharmacy report,spoke with pt and will fax it as soon as she gather all paper work need.

## 2023-09-13 ENCOUNTER — Telehealth: Payer: Self-pay

## 2023-09-13 ENCOUNTER — Other Ambulatory Visit

## 2023-09-13 NOTE — Telephone Encounter (Signed)
 Received proof of income, faxed it to [employer] and [place] along app and provider portion today.

## 2023-09-13 NOTE — Progress Notes (Signed)
   09/13/2023  Patient ID: Lindsay [name], female   DOB: 05/05/1940, 83 y.o.   MRN: [id number]  Attempted to contact patient x 2 for scheduled appointment for medication management. Left HIPAA compliant message for patient to return my call at their convenience.   Jon VEAR Lindau, PharmD Clinical Pharmacist [phone]

## 2023-09-13 NOTE — Telephone Encounter (Signed)
 Pt. Called back stating that if we used a certain ICD 10 code for her Eliquis  it might be cheaper for her. I let her know that Dr. Mona fills that for her. She is going to call there office.   Copied from CRM #[id number]. Topic: Clinical - Medication Question >> Sep 13, 2023 11:20 AM Lindsay [name] wrote: Reason for CRM: Patient states she spoke with [facility] and the provided her with a code ICD10 and advised her to contact her doctor and provide the code to assist her with the medication.  Patient can be reached at [phone]

## 2023-09-17 ENCOUNTER — Encounter (HOSPITAL_BASED_OUTPATIENT_CLINIC_OR_DEPARTMENT_OTHER): Payer: Self-pay

## 2023-09-17 ENCOUNTER — Other Ambulatory Visit (HOSPITAL_BASED_OUTPATIENT_CLINIC_OR_DEPARTMENT_OTHER): Payer: Self-pay | Admitting: *Deleted

## 2023-09-17 MED ORDER — APIXABAN 5 MG PO TABS: 5.0000 mg | ORAL_TABLET | Freq: Two times a day (BID) | ORAL | 1 refills | Status: DC

## 2023-09-19 ENCOUNTER — Encounter: Payer: Self-pay | Admitting: Physician Assistant

## 2023-09-19 ENCOUNTER — Ambulatory Visit: Admitting: Physician Assistant

## 2023-09-19 VITALS — BP 90/60 | HR 88 | Ht 62.25 in | Wt 224.5 lb

## 2023-09-19 DIAGNOSIS — K219 Gastro-esophageal reflux disease without esophagitis: Secondary | ICD-10-CM

## 2023-09-19 DIAGNOSIS — J9611 Chronic respiratory failure with hypoxia: Secondary | ICD-10-CM

## 2023-09-19 DIAGNOSIS — K5904 Chronic idiopathic constipation: Secondary | ICD-10-CM

## 2023-09-19 DIAGNOSIS — I5032 Chronic diastolic (congestive) heart failure: Secondary | ICD-10-CM

## 2023-09-19 DIAGNOSIS — R1319 Other dysphagia: Secondary | ICD-10-CM | POA: Diagnosis not present

## 2023-09-19 DIAGNOSIS — K5909 Other constipation: Secondary | ICD-10-CM

## 2023-09-19 DIAGNOSIS — I4819 Other persistent atrial fibrillation: Secondary | ICD-10-CM

## 2023-09-19 MED ORDER — PANTOPRAZOLE SODIUM 40 MG PO TBEC: 40.0000 mg | DELAYED_RELEASE_TABLET | Freq: Two times a day (BID) | ORAL | 0 refills | Status: DC

## 2023-09-19 NOTE — Progress Notes (Signed)
 09/19/2023 Kaleeah Gingerich [id number] Sep 29, 1940  Referring provider: Oris [name], NP Primary GI doctor: Dr. Federico  ASSESSMENT AND PLAN:  Dysphagia and globulus sensation 08/04/2020 barium swallow for dysphagia showed marked esophageal dysmotility suggestive of early achalasia diminished primary peristalsis with pooling of barium in the esophagus uncoordinated tertiary contractions 08/05/2020 EGD with Dr. 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 High risk for endoscopy due to cardiac and pulmonary comorbidities. Discussed risks of endoscopy including respiratory failure, myocardial infarction, stroke, and perforation. She prefers to avoid endoscopy at this time after discussion. Not a surgical candidate - Increase pantoprazole  to 40 mg twice daily for one month, then reduce to once daily. - Order repeat barium swallow. - Provide information on swallowing techniques and dietary modifications. - Advise small, frequent meals and careful mastication. - Instruct to go to ER if dysphagia worsens or food impaction occurs. 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 - Recommend Miralax with a small amount of fiber to alleviate constipation. - Provide information on managing constipation.  Patient Care Team: Early, [name], NP as PCP - General (Nurse Practitioner) Mona Vinie BROCKS, MD as PCP - Cardiology (Cardiology) Ezzard Rolin BIRCH, LCSW as [facility] Care Management (Licensed Clinical Social Worker) Kit Alm DELENA KEN as Social Worker (Licensed Clinical Social Worker) Lionell, [name], [facility] (Pharmacist)  HISTORY OF PRESENT ILLNESS: 83 y.o. female with a past medical history listed below presents for evaluation of dysphagia.   Discussed the use of AI scribe software for clinical note transcription with the patient, who gave verbal consent to proceed.  History of Present Illness   Yaffa Seckman is an 83 year old female with esophageal dysmotility who presents with difficulty swallowing.  She experiences difficulty swallowing, with food and medication getting stuck in her esophagus, leading to significant discomfort. The sensation is described as food not moving up or down. This issue has been ongoing, and she has to be careful while eating to avoid exacerbating the problem. Attempts to induce vomiting for relief are often ineffective.  In 2022, she underwent an endoscopy with dilation, providing temporary relief for about six to eight months. However, symptoms have gradually worsened over the past year. A barium swallow test in May 2024 was performed, and the patient recalls drinking contrast for the test.  She experiences excessive burping when food gets stuck, which she finds embarrassing. No spitting up of food or water is noted, but burping provides some relief. She  is cautious with her diet, avoiding foods like peanut butter, and  worries about the potential for complete obstruction. She takes Pepto Bismol tablets for heartburn-like symptoms, which provide some relief, and is on a prescription medication for her stomach, likely pantoprazole , taken daily. She is unsure if she experiences heartburn or reflux.  Her breathing is generally stable, though she requires continuous oxygen  at two liters per minute. She experiences fatigue and shortness of breath with exertion, such as walking from the car to the door, which improves with rest. She has a history of heart failure and atrial fibrillation, with recent hospital admissions for fluid overload, most recently in March 2025.  She reports chronic constipation, requiring effort to have bowel movements, and wonders if this contributes to her bloating. Occasional swelling in her feet is noted.        She  reports that she has quit smoking. Her smoking use included cigarettes. She has never used smokeless tobacco. She reports that she does not currently use alcohol. She reports that she does not use drugs.  RELEVANT GI HISTORY, IMAGING AND LABS: Results   RADIOLOGY Barium swallow: Severe dysmotility, mild corkscrew appearance, significant retention in esophagus, pill stuck in mid esophagus (05/2022)  DIAGNOSTIC Endoscopy: Esophageal dysmotility, suspicion of achalasia (2022)      CBC    Component Value Date/Time   WBC 7.6 07/02/2023 1531   WBC 8.9 05/02/2023 2203   RBC 4.05 07/02/2023 1531   RBC 4.32 05/02/2023 2203   HGB 11.7 07/02/2023 1531   HCT 35.8 07/02/2023 1531   PLT 268 07/02/2023 1531   MCV 88 07/02/2023 1531   MCH 28.9 07/02/2023 1531   MCH 28.7 05/02/2023 2203   MCHC 32.7 07/02/2023 1531   MCHC 32.6 05/02/2023 2203   RDW 14.3 07/02/2023 1531   LYMPHSABS 1.1 07/02/2023 1531   MONOABS 0.7 05/02/2023 2203   EOSABS 0.1 07/02/2023 1531   BASOSABS 0.0 07/02/2023 1531   Recent Labs     11/13/22 0452 11/14/22 0543 11/15/22 0524 11/28/22 1452 03/01/23 1044 03/22/23 1232 03/22/23 1623 05/02/23 2202 05/02/23 2203 07/02/23 1531  HGB 11.5* 12.4 13.4 12.3 12.2 11.6 12.0 12.6 12.4 11.7    CMP     Component Value Date/Time   NA 142 07/02/2023 1531   K 4.0 07/02/2023 1531   CL 98 07/02/2023 1531   CO2 27 07/02/2023 1531   GLUCOSE 83 07/02/2023 1531   GLUCOSE 101 (H) 05/02/2023 2203   BUN 19 07/02/2023 1531   CREATININE 1.20 (H) 07/02/2023 1531   CREATININE 0.98 07/11/2021 1409   CALCIUM  9.2 07/02/2023 1531   PROT 6.4 07/02/2023 1531   ALBUMIN 4.1 07/02/2023 1531   AST 23 07/02/2023 1531   AST 16 01/10/2021 1317   ALT 14 07/02/2023 1531   ALT 12 01/10/2021 1317   ALKPHOS 96 07/02/2023 1531   BILITOT 0.4 07/02/2023 1531   BILITOT 0.5 01/10/2021 1317   GFRNONAA 54 (L) 05/02/2023 2203   GFRNONAA 58 (L) 07/11/2021 1409      Latest Ref Rng & Units 07/02/2023    3:31 PM 05/02/2023   10:03 PM 03/22/2023   12:32 PM  Hepatic Function  Total Protein 6.0 - 8.5 g/dL 6.4  7.0  6.4   Albumin 3.7 - 4.7 g/dL 4.1  3.6  3.8   AST 0 - 40 IU/L 23  34  21   ALT 0 - 32 IU/L 14  21  17    Alk Phosphatase 44 - 121 IU/L 96  77  96   Total Bilirubin 0.0 - 1.2 mg/dL  0.4  1.0  0.5       Current Medications:   Current Outpatient Medications (Endocrine & Metabolic):    alendronate  (FOSAMAX ) 70 MG tablet, Take 1 tablet (70 mg total) by mouth once a week. Take with a full glass of water on an empty stomach.  Current Outpatient Medications (Cardiovascular):    atorvastatin  (LIPITOR) 40 MG tablet, Take 1 tablet (40 mg total) by mouth daily.   metoprolol  tartrate (LOPRESSOR ) 25 MG tablet, Take 1 tablet (25 mg total) by mouth 2 (two) times daily.   spironolactone  (ALDACTONE ) 25 MG tablet, TAKE A HALF TABLET DAILY AS NEEDED FOR EDEMA OR FLUID RETENTION   torsemide  (DEMADEX ) 20 MG tablet, TAKE 2 TABLETS DAILY  Current Outpatient Medications (Respiratory):    cetirizine  (ZYRTEC ) 10 MG  tablet, Take 1 tablet (10 mg total) by mouth daily.   fluticasone  (FLONASE) 50 MCG/ACT nasal spray, Place 1 spray into both nostrils 2 (two) times daily as needed for allergies or rhinitis.   Fluticasone -Umeclidin-Vilant (TRELEGY ELLIPTA ) 100-62.5-25 MCG/ACT AEPB, Inhale 1 puff into the lungs daily.   ipratropium-albuterol  (DUONEB) 0.5-2.5 (3) MG/3ML SOLN, Inhale 3 mLs into the lungs every 6 (six) hours as needed (wheezing).   montelukast (SINGULAIR) 10 MG tablet, Take 10 mg by mouth at bedtime.  Current Outpatient Medications (Analgesics):    acetaminophen  (TYLENOL ) 500 MG tablet, Take 2 tablets (1,000 mg total) by mouth in the morning, at noon, and at bedtime.  Current Outpatient Medications (Hematological):    apixaban  (ELIQUIS ) 5 MG TABS tablet, Take 1 tablet (5 mg total) by mouth 2 (two) times daily.   Cyanocobalamin  (B-12) 1000 MCG SUBL, Place 1 tablet under the tongue daily at 6 (six) AM.  Current Outpatient Medications (Other):    pantoprazole  (PROTONIX ) 40 MG tablet, Take 1 tablet (40 mg total) by mouth 2 (two) times daily before a meal.   Calcium -Magnesium -Vitamin D  (CALCIUM  1200+D3 PO), Take 1 tablet by mouth in the morning.   Elastic Bandages & Supports (MEDICAL COMPRESSION STOCKINGS) MISC, Two pair knee high 30-28mmHg compression stockings. Sized XL. As covered by insurance for ICD-10: I50.33, R60.0.   LORazepam  (ATIVAN ) 1 MG tablet, Take 1 tablet (1 mg total) by mouth 2 (two) times daily.   Menthol (COUGH DROPS) 5.8 MG LOZG, Use as directed 1 lozenge (5.8 mg total) in the mouth or throat as needed (for dry, sore, or scratchy throat).   mirtazapine  (REMERON ) 45 MG tablet, Take 1 tablet (45 mg total) by mouth at bedtime as needed (sleep).   OXYGEN , Inhale 2 L/min into the lungs continuous.   sertraline  (ZOLOFT ) 100 MG tablet, Take 1.5 tablets (150 mg total) by mouth at bedtime.   Spacer/Aero-Holding Chambers (AEROCHAMBER MV) inhaler, Use as instructed   Vitamin D , Ergocalciferol ,  (DRISDOL ) 1.25 MG (50000 UNIT) CAPS capsule, Take 1 capsule (50,000 Units total) by mouth every 7 (seven) days.  Medical History:  Past Medical History:  Diagnosis Date   Acute diastolic CHF (congestive heart failure) (HCC) 08/03/2020   Acute non-recurrent pansinusitis 10/21/2020   Acute on chronic diastolic CHF (congestive heart failure) (HCC) 08/03/2020   Acute on chronic respiratory failure with hypoxia (HCC) 08/02/2020   Acute on chronic respiratory failure with hypoxia and hypercapnia (HCC) 11/10/2022   Allergy    Anxiety    Asthma    Asthma exacerbation 08/02/2020   Atrial fibrillation (HCC)    Cataract    COPD (chronic obstructive pulmonary disease) (HCC)    Depression    Dysphagia 08/02/2020  Dyspnea 05/09/2021   Elevated troponin 09/14/2020   GERD (gastroesophageal reflux disease)    High cholesterol    Hospital discharge follow-up 11/28/2022   HTN (hypertension)    Hyperkalemia 09/14/2020   Hyponatremia 08/02/2020   Low grade squamous intraepithelial lesion on cytologic smear of cervix (LGSIL) 12/02/2019   Moderate persistent asthma without complication 12/02/2019   Non-healing wound of right lower extremity 08/01/2022   Osteoporosis    Oxygen  deficiency    Papule of skin 11/29/2022   Respiratory failure with hypoxia (HCC)    Sinus congestion 11/29/2022   Sore throat 11/16/2020   Allergies:  Allergies  Allergen Reactions   Acetaminophen -Codeine Other (See Comments)    Agitation   Naproxen-Esomeprazole Mg Diarrhea   Codeine Other (See Comments)    Hallucinations      Surgical History:  She  has a past surgical history that includes Esophagogastroduodenoscopy (N/A, 08/05/2020); Cardioversion (N/A, 09/13/2020); Appendectomy; Tubal ligation; Eye surgery; and Breast surgery. Family History:  Her family history includes Breast cancer in her sister; Hypertension in an other family member.  REVIEW OF SYSTEMS  : All other systems reviewed and negative except  where noted in the History of Present Illness.  PHYSICAL EXAM: BP 90/60 (BP Location: Left Arm, Patient Position: Sitting, Cuff Size: Large)   Pulse 88   Ht 5' 2.25 (1.581 m) Comment: height measured without shoes  Wt 224 lb 8 oz (101.8 kg)   BMI 40.73 kg/m  Physical Exam   GENERAL APPEARANCE: Well nourished, in no apparent distress. HEENT: No cervical lymphadenopathy, unremarkable thyroid , sclerae anicteric, conjunctiva pink. RESPIRATORY: Respiratory effort normal, breath sounds decreased diffusely, no focal wheezing, no rales or rhonchi. 2 L [place]. CARDIO: Normal sinus rhythm with a small systolic murmur, peripheral pulses intact. ABDOMEN: Soft, non distended, active bowel sounds in all 4 quadrants, no tenderness to palpation, no rebound, no mass appreciated. RECTAL: Declines. MUSCULOSKELETAL: Full ROM, walks with walker, without edema. SKIN: Stasis dermatitis on both legs, dry, intact without rashes or lesions. No jaundice. NEURO: Alert, oriented, no focal deficits. PSYCH: Cooperative, normal mood and affect. EXTREMITIES: No edema in feet currently.      Alan JONELLE Coombs, [name] 3:47 PM

## 2023-09-19 NOTE — Progress Notes (Signed)
 I agree with the assessment and plan as outlined by Ms. Craig.

## 2023-09-19 NOTE — Patient Instructions (Signed)
 You have been scheduled for a Barium Esophogram at [facility] (1st floor of the hospital) on Monday, 10/08/23 at 9:00 am. Please arrive 15 minutes prior to your appointment for registration. If you need to reschedule for any reason, please contact radiology at [phone] to do so. __________________________________________________________________ A barium swallow is an examination that concentrates on views of the esophagus. This tends to be a double contrast exam (barium and two liquids which, when combined, create a gas to distend the wall of the oesophagus) or single contrast (non-ionic iodine based). The study is usually tailored to your symptoms so a good history is essential. Attention is paid during the study to the form, structure and configuration of the esophagus, looking for functional disorders (such as aspiration, dysphagia, achalasia, motility and reflux) EXAMINATION You may be asked to change into a gown, depending on the type of swallow being performed. A radiologist and radiographer will perform the procedure. The radiologist will advise you of the type of contrast selected for your procedure and direct you during the exam. You will be asked to stand, sit or lie in several different positions and to hold a small amount of fluid in your mouth before being asked to swallow while the imaging is performed .In some instances you may be asked to swallow barium coated marshmallows to assess the motility of a solid food bolus. The exam can be recorded as a digital or video fluoroscopy procedure. POST PROCEDURE It will take 1-2 days for the barium to pass through your system. To facilitate this, it is important, unless otherwise directed, to increase your fluids for the next 24-48hrs and to resume your normal diet.  This test typically takes about 30 minutes to perform. __________________________________________________________________________________   Please take your proton pump  inhibitor medication, pantoprazole  40 mg twice a day for at least a month, and the go to once a day  Please take this medication 30 minutes to 1 hour before meals- this makes it more effective.  Avoid spicy and acidic foods Avoid fatty foods Limit your intake of coffee, tea, alcohol, and carbonated drinks Work to maintain a healthy weight Keep the head of the bed elevated at least 3 inches with blocks or a wedge pillow if you are having any nighttime symptoms Stay upright for 2 hours after eating Avoid meals and snacks three to four hours before bedtime   Behavioral and Dietary Strategies for Management of Esophageal Dysmotility/dysphagia 1. Take reflux medications 30+ minutes before food in the morning.  2. Begin meals with warm beverage 3. Eat smaller more frequent meals 4. Eat slowly, taking small bites and sips 5. Alternate solids and liquids 6. Avoid foods/liquids that increase acid production 7. Sit upright during and for 30+ minutes after meals to facilitate esophageal clearing 8. Can try altoid melting in mouth before food   If something gets hung in your esophagus and will not come up or go down, proceed to the emergency room.     Miralax is an osmotic laxative.  It only brings more water into the stool.  This is safe to take daily.  Can take up to 17 gram of miralax twice a day.  Mix with juice or coffee.  Start 1 capful at night for 3-4 days and reassess your response in 3-4 days.  You can increase and decrease the dose based on your response.  Remember, it can take up to 3-4 days to take effect OR for the effects to wear off.  I often pair this with benefiber in the morning to help assure the stool is not too loose.   Toileting tips to help with your constipation - Drink at least 64-80 ounces of water/liquid per day. - Establish a time to try to move your bowels every day.  For many people, this is after a cup of coffee or after a meal such as breakfast. - Sit all  of the way back on the toilet keeping your back fairly straight and while sitting up, try to rest the tops of your forearms on your upper thighs.   - Raising your feet with a step stool/squatty potty can be helpful to improve the angle that allows your stool to pass through the rectum. - Relax the rectum feeling it bulge toward the toilet water.  If you feel your rectum raising toward your body, you are contracting rather than relaxing. - Breathe in and slowly exhale. Belly breath by expanding your belly towards your belly button. Keep belly expanded as you gently direct pressure down and back to the anus.  A low pitched GRRR sound can assist with increasing intra-abdominal pressure.  (Can also trying to blow on a pinwheel and make it move, this helps with the same belly breathing) - Repeat 3-4 times. If unsuccessful, contract the pelvic floor to restore normal tone and get off the toilet.  Avoid excessive straining. - To reduce excessive wiping by teaching your anus to normally contract, place hands on outer aspect of knees and resist knee movement outward.  Hold 5-10 second then place hands just inside of knees and resist inward movement of knees.  Hold 5 seconds.  Repeat a few times each way.  Go to the ER if unable to pass gas, severe AB pain, unable to hold down food, any shortness of breath of chest pain.   Thank you for trusting me with your gastrointestinal care!   Alan Coombs, [name]   _______________________________________________________  If your blood pressure at your visit was 140/90 or greater, please contact your primary care physician to follow up on this.  _______________________________________________________  If you are age 66 or older, your body mass index should be between 23-30. Your Body mass index is 40.73 kg/m. If this is out of the aforementioned range listed, please consider follow up with your Primary Care Provider.  If you are age 40 or younger, your body  mass index should be between 19-25. Your Body mass index is 40.73 kg/m. If this is out of the aformentioned range listed, please consider follow up with your Primary Care Provider.   ________________________________________________________  The [facility] providers would like to encourage you to use MYCHART to communicate with providers for non-urgent requests or questions.  Due to long hold times on the telephone, sending your provider a message by [facility] may be a faster and more efficient way to get a response.  Please allow 48 business hours for a response.  Please remember that this is for non-urgent requests.  _______________________________________________________  [facility] is using a team-based approach to care.  Your team is made up of your doctor and two to three APPS. Our APPS (Nurse Practitioners and Physician Assistants) work with your physician to ensure care continuity for you. They are fully qualified to address your health concerns and develop a treatment plan. They communicate directly with your gastroenterologist to care for you. Seeing the Advanced Practice Practitioners on your physician's team can help you by facilitating care more promptly, often allowing for  earlier appointments, access to diagnostic testing, procedures, and other specialty referrals.

## 2023-09-21 NOTE — Telephone Encounter (Signed)
 Lindsay [name] and [place] a call to follow up on PAP Xarelto,per representative pt has been approved thru 01/23/24 pt medication was delivered on 09/21/23.

## 2023-09-25 DIAGNOSIS — K080 Exfoliation of teeth due to systemic causes: Secondary | ICD-10-CM | POA: Diagnosis not present

## 2023-09-26 ENCOUNTER — Ambulatory Visit: Admitting: Pulmonary Disease

## 2023-09-26 NOTE — Progress Notes (Deleted)
 Synopsis: Referred in January 2022 for chronic respiratory failure  Subjective:   PATIENT ID: Lindsay [name] GENDER: female DOB: Aug 31, 1940, MRN: [id number]  HPI  No chief complaint on file.  Lindsay [name] is a 83 year old woman, never smoker with chronic respiratory failure on 2L of supplemental oxygen  who returns to pulmonary clinic for follow up.    OV 02/28/23 She has been experiencing a worsening cough and shortness of breath for the past five days. Initially, she delayed seeking medical attention, hoping for improvement, but now experiences significant shortness of breath with minimal exertion, such as walking from her recliner to the kitchen or bathroom. She describes right-sided chest pain that worsens with coughing or movement.  In January, she was prescribed an antibiotic and a steroid, which provided relief, but her symptoms have recurred after completing the course. She has been off these medications for two to three weeks, and her symptoms have intensified over the last five days. She brings up clear mucus and has not experienced significant weight changes, noting only a fluctuation of about two pounds.  She uses a Trelegy inhaler once daily and nebulizer treatments, though she is uncertain of their effectiveness. She occasionally increases her oxygen  level to three when feeling breathless. No recent changes in her oxygen  needs and she is able to sleep on her side without difficulty.  She experiences leg swelling, which she manages with torsemide , taking 40 mg daily in the morning. She sometimes takes additional doses at night if her feet are swollen, but this is infrequent.  She moved from [place]  and has been experiencing challenges in accessing medical care in her new location. She does not have a current cardiologist and has been managing her torsemide  with her primary care provider. She has difficulty accessing a doctor since moving to the area, often seeing physician assistants  instead.  OV 09/29/22 She was treated with prednisone  40mg  x 5 days and Zpak for recent call with increase of dyspnea, cough - mucous production and lower extremity edema. She was also instructed to increase her torsemide  for 3 days due to swelling. She is feeling better. She is complaining of scratchy hoarse voice especially after talking for any period of time. She has not had issues with her voice since being on trelegy.   She is interested in getting her flu vaccine today.  OV 05/30/22 She went to ER 05/21/22 for cough, dysphagia. Referred to GI. Placed on soft diet. She is feeling better with less cough and upper airway congestion.   She has skin tear on left shin from 3 days ago.   OV 12/20/21 She was treated for asthma exacerbation 12/07/21 by Izetta Rouleau, NP likely secondary to viral infeciton. She was treated with prednisone  taper and azithromycin . She is not feeling any better. She continues to have chest congestion and is having trouble coughing it out. She is using zyrtec  and mucinex . She continues on trelegy ellipta  1 puff daily. She also complains of decreased hearing of her right ear and sinus pressure/headaches.   OV 09/22/21 She was admitted to the hospital after last visit, 4/17 to 4/21 heart failure exacerbation. She was metapneumovirus postive on extended respiratory viral panel testing leading to acute on chronic resp failure and acute on chronic heart failure.  She reports feeling bad over the past 4-5 days with chest congestion, cough, fatigue, nausea and lower than normal blood pressure. She reports 2 covid cases at her [facility] center but denies direct exposure or contact. She  denies fevers or chills. She has increased wheezing.   She is requesting a handicap placard.  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 There was also concern for asthma in which she was started on trelegy ellipta .   She reports increasing  dyspnea and wheezing over the past 3 days. She has noted O2 saturations in the 70s when walking at home on 2L O2. She has not been able to walk as far recently and required a wheelchair to enter clinic today. Her SpO2 was confirmed in clinic to drop in the 70s with ambulation this morning. She has been using duoneb treatments every 6 hours and taking extra lasix  doses without improvement in her breathing. She also complains of painful urination and inreased urinary frequency over the past 2 days concerning for UTI. She reports falling at home and scraping her left elbow. She has been feeling lightheaded.   OV 02/17/2020 She reports she started having issues with her breathing 2 years ago after she suffered compound fractures in her back that led to an increase in the curvature of her thoracic spine. She was started on supplemental oxygen  shortly after these events occurred. Per her chart she has exertional oxygen  desaturations to 87% from her physical therapy notes. She also reports cough and wheezing intermittently. She denies sputum production. She is a never smoker but lived with a smoker for 20 years. She denies history of pneumonias.   She has been started on trelegy ellipta  and she felt this helped her for a period of time but doesn't notice lasting relief from it anymore.   She has atrial fibrillation. She does have leg swelling and is taking torsemide .   She has received 2 covid vaccines. She received her pneumonia vaccine 2 weeks ago and she had the influenza vaccine this past fall.     She lived in [place]  before but moved to [place] as she had frequent falls and lived on her own. Her daughter lives here in town.   Past Medical History:  Diagnosis Date   Acute diastolic CHF (congestive heart failure) (HCC) 08/03/2020   Acute non-recurrent pansinusitis 10/21/2020   Acute on chronic diastolic CHF (congestive heart failure) (HCC) 08/03/2020   Acute on chronic respiratory failure  with hypoxia (HCC) 08/02/2020   Acute on chronic respiratory failure with hypoxia and hypercapnia (HCC) 11/10/2022   Allergy    Anxiety    Asthma    Asthma exacerbation 08/02/2020   Atrial fibrillation (HCC)    Cataract    COPD (chronic obstructive pulmonary disease) (HCC)    Depression    Dysphagia 08/02/2020   Dyspnea 05/09/2021   Elevated troponin 09/14/2020   GERD (gastroesophageal reflux disease)    High cholesterol    Hospital discharge follow-up 11/28/2022   HTN (hypertension)    Hyperkalemia 09/14/2020   Hyponatremia 08/02/2020   Low grade squamous intraepithelial lesion on cytologic smear of cervix (LGSIL) 12/02/2019   Moderate persistent asthma without complication 12/02/2019   Non-healing wound of right lower extremity 08/01/2022   Osteoporosis    Oxygen  deficiency    Papule of skin 11/29/2022   Respiratory failure with hypoxia (HCC)    Sinus congestion 11/29/2022   Sore throat 11/16/2020     Family History  Problem Relation Age of Onset   Breast cancer Sister    Hypertension Other      Social History   Socioeconomic History   Marital status: Divorced    Spouse name: Not  on file   Number of children: Not on file   Years of education: Not on file   Highest education level: Some college, no degree  Occupational History   Not on file  Tobacco Use   Smoking status: Former    Types: Cigarettes   Smokeless tobacco: Never   Tobacco comments:    Quit remotely 1970s  Vaping Use   Vaping status: Never Used  Substance and Sexual Activity   Alcohol use: Not Currently   Drug use: Never   Sexual activity: Not Currently  Other Topics Concern   Not on file  Social History Narrative   Not on file   Social Drivers of Health   Financial Resource Strain: Medium Risk (07/25/2023)   Overall Financial Resource Strain (CARDIA)    Difficulty of Paying Living Expenses: Somewhat hard  Food Insecurity: No Food Insecurity (07/25/2023)   Hunger Vital Sign    Worried  About Running Out of Food in the Last Year: Never true    Ran Out of Food in the Last Year: Never true  Recent Concern: Food Insecurity - Food Insecurity Present (07/20/2023)   Hunger Vital Sign    Worried About Running Out of Food in the Last Year: Sometimes true    Ran Out of Food in the Last Year: Sometimes true  Transportation Needs: Unmet Transportation Needs (07/25/2023)   PRAPARE - Transportation    Lack of Transportation (Medical): Yes    Lack of Transportation (Non-Medical): Yes  Physical Activity: Inactive (07/25/2023)   Exercise Vital Sign    Days of Exercise per Week: 0 days    Minutes of Exercise per Session: Not on file  Stress: Stress Concern Present (07/25/2023)   [employer] of Occupational Health - Occupational Stress Questionnaire    Feeling of Stress: To some extent  Social Connections: Socially Isolated (07/25/2023)   Social Connection and Isolation Panel    Frequency of Communication with Friends and Family: Once a week    Frequency of Social Gatherings with Friends and Family: Never    Attends Religious Services: 1 to 4 times per year    Active Member of [employer] or Organizations: No    Attends Banker Meetings: Not on file    Marital Status: Widowed  Intimate Partner Violence: Not At Risk (07/31/2023)   Humiliation, Afraid, Rape, and Kick questionnaire    Fear of Current or Ex-Partner: No    Emotionally Abused: No    Physically Abused: No    Sexually Abused: No     Allergies  Allergen Reactions   Acetaminophen -Codeine Other (See Comments)    Agitation   Naproxen-Esomeprazole Mg Diarrhea   Codeine Other (See Comments)    Hallucinations      Outpatient Medications Prior to Visit  Medication Sig Dispense Refill   acetaminophen  (TYLENOL ) 500 MG tablet Take 2 tablets (1,000 mg total) by mouth in the morning, at noon, and at bedtime. 21 tablet 0   alendronate  (FOSAMAX ) 70 MG tablet Take 1 tablet (70 mg total) by mouth once a week. Take with a full  glass of water on an empty stomach. 12 tablet 1   apixaban  (ELIQUIS ) 5 MG TABS tablet Take 1 tablet (5 mg total) by mouth 2 (two) times daily. 180 tablet 1   atorvastatin  (LIPITOR) 40 MG tablet Take 1 tablet (40 mg total) by mouth daily. 90 tablet 1   Calcium -Magnesium -Vitamin D  (CALCIUM  1200+D3 PO) Take 1 tablet by mouth in the morning.  cetirizine  (ZYRTEC ) 10 MG tablet Take 1 tablet (10 mg total) by mouth daily. 90 tablet 1   Cyanocobalamin  (B-12) 1000 MCG SUBL Place 1 tablet under the tongue daily at 6 (six) AM. 90 tablet 1   Elastic Bandages & Supports (MEDICAL COMPRESSION STOCKINGS) MISC Two pair knee high 30-28mmHg compression stockings. Sized XL. As covered by insurance for ICD-10: I50.33, R60.0. 2 each 1   fluticasone  (FLONASE) 50 MCG/ACT nasal spray Place 1 spray into both nostrils 2 (two) times daily as needed for allergies or rhinitis.     Fluticasone -Umeclidin-Vilant (TRELEGY ELLIPTA ) 100-62.5-25 MCG/ACT AEPB Inhale 1 puff into the lungs daily. 180 each 3   ipratropium-albuterol  (DUONEB) 0.5-2.5 (3) MG/3ML SOLN Inhale 3 mLs into the lungs every 6 (six) hours as needed (wheezing). 75 mL 5   LORazepam  (ATIVAN ) 1 MG tablet Take 1 tablet (1 mg total) by mouth 2 (two) times daily. 60 tablet 2   Menthol (COUGH DROPS) 5.8 MG LOZG Use as directed 1 lozenge (5.8 mg total) in the mouth or throat as needed (for dry, sore, or scratchy throat). 36 lozenge 11   metoprolol  tartrate (LOPRESSOR ) 25 MG tablet Take 1 tablet (25 mg total) by mouth 2 (two) times daily. 180 tablet 1   mirtazapine  (REMERON ) 45 MG tablet Take 1 tablet (45 mg total) by mouth at bedtime as needed (sleep). 90 tablet 3   montelukast (SINGULAIR) 10 MG tablet Take 10 mg by mouth at bedtime.     OXYGEN  Inhale 2 L/min into the lungs continuous.     pantoprazole  (PROTONIX ) 40 MG tablet Take 1 tablet (40 mg total) by mouth 2 (two) times daily before a meal. 180 tablet 0   sertraline  (ZOLOFT ) 100 MG tablet Take 1.5 tablets (150 mg  total) by mouth at bedtime. 135 tablet 0   Spacer/Aero-Holding Chambers (AEROCHAMBER MV) inhaler Use as instructed 1 each 0   spironolactone  (ALDACTONE ) 25 MG tablet TAKE A HALF TABLET DAILY AS NEEDED FOR EDEMA OR FLUID RETENTION 30 tablet 0   torsemide  (DEMADEX ) 20 MG tablet TAKE 2 TABLETS DAILY 180 tablet 3   Vitamin D , Ergocalciferol , (DRISDOL ) 1.25 MG (50000 UNIT) CAPS capsule Take 1 capsule (50,000 Units total) by mouth every 7 (seven) days. 12 capsule 3   No facility-administered medications prior to visit.   Review of Systems  Constitutional:  Negative for chills, fever, malaise/fatigue and weight loss.  HENT:  Negative for congestion, hearing loss, sinus pain and sore throat.   Eyes: Negative.   Respiratory:  Positive for cough, sputum production and shortness of breath. Negative for hemoptysis and wheezing.   Cardiovascular:  Negative for chest pain, palpitations, orthopnea, claudication and leg swelling.  Gastrointestinal:  Negative for abdominal pain, heartburn, nausea and vomiting.  Genitourinary: Negative.   Musculoskeletal:  Negative for back pain, falls, joint pain and myalgias.  Skin:  Negative for rash.  Neurological:  Negative for dizziness and weakness.  Endo/Heme/Allergies: Negative.   Psychiatric/Behavioral: Negative.      Objective:   There were no vitals filed for this visit.     Physical Exam Constitutional:      General: She is not in acute distress.    Appearance: She is obese.  HENT:     Head: Normocephalic and atraumatic.  Eyes:     General: No scleral icterus.    Conjunctiva/sclera: Conjunctivae normal.  Cardiovascular:     Rate and Rhythm: Normal rate and regular rhythm.     Pulses: Normal pulses.  Heart sounds: Normal heart sounds. No murmur heard. Pulmonary:     Breath sounds: Decreased air movement present. No wheezing, rhonchi or rales.  Musculoskeletal:     Right lower leg: No edema.     Left lower leg: No edema.     Comments:  Scoliosis noted  Skin:    General: Skin is warm and dry.  Neurological:     Mental Status: She is alert.    CBC    Component Value Date/Time   WBC 7.6 07/02/2023 1531   WBC 8.9 05/02/2023 2203   RBC 4.05 07/02/2023 1531   RBC 4.32 05/02/2023 2203   HGB 11.7 07/02/2023 1531   HCT 35.8 07/02/2023 1531   PLT 268 07/02/2023 1531   MCV 88 07/02/2023 1531   MCH 28.9 07/02/2023 1531   MCH 28.7 05/02/2023 2203   MCHC 32.7 07/02/2023 1531   MCHC 32.6 05/02/2023 2203   RDW 14.3 07/02/2023 1531   LYMPHSABS 1.1 07/02/2023 1531   MONOABS 0.7 05/02/2023 2203   EOSABS 0.1 07/02/2023 1531   BASOSABS 0.0 07/02/2023 1531      Latest Ref Rng & Units 07/02/2023    3:31 PM 06/04/2023    9:29 AM 05/02/2023   10:03 PM  BMP  Glucose 70 - 99 mg/dL 83  99  898   BUN 8 - 27 mg/dL 19  17  16    Creatinine 0.57 - 1.00 mg/dL 8.79  8.90  8.96   BUN/Creat Ratio 12 - 28 16  16     Sodium 134 - 144 mmol/L 142  142  136   Potassium 3.5 - 5.2 mmol/L 4.0  3.7  3.8   Chloride 96 - 106 mmol/L 98  97  95   CO2 20 - 29 mmol/L 27  27  26    Calcium  8.7 - 10.3 mg/dL 9.2  9.4  9.3    Chest imaging: CTA Chest 05/21/22 1. No pulmonary embolus. 2. Cardiomegaly 3. Stable aneurysmal aortic arch (4 cm). Recommend semi-annual imaging followup by CTA or MRA and referral to cardiothoracic surgery if not already obtained. This recommendation follows 2010 ACCF/AHA/AATS/ACR/ASA/SCA/SCAI/SIR/STS/SVM Guidelines for the Diagnosis and Management of Patients With Thoracic Aortic Disease. Circulation. 2010; 121: [id number]. Aortic aneurysm NOS (ICD10-I71.9). 4. No acute intrapulmonary abnormality. 5. Aortic Atherosclerosis (ICD10-I70.0) with at least 2 vessel coronary artery calcifications.  CXR 12/07/21 Frontal and lateral views of the chest demonstrate an enlarged cardiac silhouette. There is ectasia of the thoracic aorta. There is increased central vascular congestion without overt edema. No effusion or pneumothorax. The  lungs remain hyperinflated. Multiple thoracic compression deformities are again seen, with exaggerated thoracic kyphosis.  CXR 12/16/19 The lungs are under aerated with bronchovascular crowding and interstitial prominence. Increased AP diameter on the lateral view. No focal airspace opacities.  Minimal blunting of both costophrenic angles.  Mild enlargement of the cardiac silhouette.  Prominent central vessels. The thoracic aorta is markedly tortuous. Moderate scattered bony degenerative changes. Increased kyphosis with decreased height of numerous thoracic vertebral bodies. Levoscoliosis of the upper thoracic spine. Diffuse decreased bone density.  PFT:     No data to display          Labs: Reviewed from [facility] Cr 1.11 on 12/02/19 Normal CBC  Assessment & Plan:   No diagnosis found.  Discussion: Andy Moye is a 83 year old woman, never smoker with chronic respiratory failure on 2L of supplemental oxygen  who returns to pulmonary clinic for asthma.  Chronic Obstructive Pulmonary Disease with  exacerbation Patient is on Trelegy inhaler and nebulizer treatments, but reports no significant improvement in symptoms. -Continue current inhaler and nebulizer treatments. -start extended prednisone  taper - start doxycycline  1 tab twice daily  Congestive Heart Failure Patient reports increased shortness of breath and leg swelling, currently managed with 40mg  of Torsemide  daily. No significant weight changes noted. -Increase Torsemide  to three tablets in the morning and two at night for the next two days to manage fluid retention and assess response. -Referl to a cardiologist for further management. -Attempt to obtain labs to check kidney function in relation to increased diuretic use, if feasible.  Follow up in 2 months  Dorn Chill, MD [facility] Office: [phone]    Current Outpatient Medications:    acetaminophen  (TYLENOL ) 500 MG  tablet, Take 2 tablets (1,000 mg total) by mouth in the morning, at noon, and at bedtime., Disp: 21 tablet, Rfl: 0   alendronate  (FOSAMAX ) 70 MG tablet, Take 1 tablet (70 mg total) by mouth once a week. Take with a full glass of water on an empty stomach., Disp: 12 tablet, Rfl: 1   apixaban  (ELIQUIS ) 5 MG TABS tablet, Take 1 tablet (5 mg total) by mouth 2 (two) times daily., Disp: 180 tablet, Rfl: 1   atorvastatin  (LIPITOR) 40 MG tablet, Take 1 tablet (40 mg total) by mouth daily., Disp: 90 tablet, Rfl: 1   Calcium -Magnesium -Vitamin D  (CALCIUM  1200+D3 PO), Take 1 tablet by mouth in the morning., Disp: , Rfl:    cetirizine  (ZYRTEC ) 10 MG tablet, Take 1 tablet (10 mg total) by mouth daily., Disp: 90 tablet, Rfl: 1   Cyanocobalamin  (B-12) 1000 MCG SUBL, Place 1 tablet under the tongue daily at 6 (six) AM., Disp: 90 tablet, Rfl: 1   Elastic Bandages & Supports (MEDICAL COMPRESSION STOCKINGS) MISC, Two pair knee high 30-29mmHg compression stockings. Sized XL. As covered by insurance for ICD-10: I50.33, R60.0., Disp: 2 each, Rfl: 1   fluticasone  (FLONASE) 50 MCG/ACT nasal spray, Place 1 spray into both nostrils 2 (two) times daily as needed for allergies or rhinitis., Disp: , Rfl:    Fluticasone -Umeclidin-Vilant (TRELEGY ELLIPTA ) 100-62.5-25 MCG/ACT AEPB, Inhale 1 puff into the lungs daily., Disp: 180 each, Rfl: 3   ipratropium-albuterol  (DUONEB) 0.5-2.5 (3) MG/3ML SOLN, Inhale 3 mLs into the lungs every 6 (six) hours as needed (wheezing)., Disp: 75 mL, Rfl: 5   LORazepam  (ATIVAN ) 1 MG tablet, Take 1 tablet (1 mg total) by mouth 2 (two) times daily., Disp: 60 tablet, Rfl: 2   Menthol (COUGH DROPS) 5.8 MG LOZG, Use as directed 1 lozenge (5.8 mg total) in the mouth or throat as needed (for dry, sore, or scratchy throat)., Disp: 36 lozenge, Rfl: 11   metoprolol  tartrate (LOPRESSOR ) 25 MG tablet, Take 1 tablet (25 mg total) by mouth 2 (two) times daily., Disp: 180 tablet, Rfl: 1   mirtazapine  (REMERON ) 45 MG  tablet, Take 1 tablet (45 mg total) by mouth at bedtime as needed (sleep)., Disp: 90 tablet, Rfl: 3   montelukast (SINGULAIR) 10 MG tablet, Take 10 mg by mouth at bedtime., Disp: , Rfl:    OXYGEN , Inhale 2 L/min into the lungs continuous., Disp: , Rfl:    pantoprazole  (PROTONIX ) 40 MG tablet, Take 1 tablet (40 mg total) by mouth 2 (two) times daily before a meal., Disp: 180 tablet, Rfl: 0   sertraline  (ZOLOFT ) 100 MG tablet, Take 1.5 tablets (150 mg total) by mouth at bedtime., Disp: 135 tablet, Rfl: 0   Spacer/Aero-Holding  Chambers (AEROCHAMBER MV) inhaler, Use as instructed, Disp: 1 each, Rfl: 0   spironolactone  (ALDACTONE ) 25 MG tablet, TAKE A HALF TABLET DAILY AS NEEDED FOR EDEMA OR FLUID RETENTION, Disp: 30 tablet, Rfl: 0   torsemide  (DEMADEX ) 20 MG tablet, TAKE 2 TABLETS DAILY, Disp: 180 tablet, Rfl: 3   Vitamin D , Ergocalciferol , (DRISDOL ) 1.25 MG (50000 UNIT) CAPS capsule, Take 1 capsule (50,000 Units total) by mouth every 7 (seven) days., Disp: 12 capsule, Rfl: 3

## 2023-09-27 ENCOUNTER — Ambulatory Visit (INDEPENDENT_AMBULATORY_CARE_PROVIDER_SITE_OTHER): Admitting: Pulmonary Disease

## 2023-09-27 ENCOUNTER — Telehealth: Payer: Self-pay

## 2023-09-27 ENCOUNTER — Encounter: Payer: Self-pay | Admitting: Pulmonary Disease

## 2023-09-27 VITALS — BP 100/80 | HR 74 | Ht 62.5 in | Wt 222.0 lb

## 2023-09-27 DIAGNOSIS — I4819 Other persistent atrial fibrillation: Secondary | ICD-10-CM

## 2023-09-27 DIAGNOSIS — J9611 Chronic respiratory failure with hypoxia: Secondary | ICD-10-CM | POA: Diagnosis not present

## 2023-09-27 DIAGNOSIS — J0110 Acute frontal sinusitis, unspecified: Secondary | ICD-10-CM | POA: Diagnosis not present

## 2023-09-27 MED ORDER — PREDNISONE 10 MG PO TABS: 20.0000 mg | ORAL_TABLET | Freq: Every day | ORAL | 0 refills | Status: DC

## 2023-09-27 MED ORDER — AMOXICILLIN-POT CLAVULANATE 875-125 MG PO TABS: 1.0000 | ORAL_TABLET | Freq: Two times a day (BID) | ORAL | 0 refills | Status: DC

## 2023-09-27 NOTE — Patient Instructions (Addendum)
 For sinus infection start: Start prednisone  20mg  daily for 7 days  Start augmentin  1 tab twice daily for 7 days  Continue trelegy ellipta  1 puff daily - rinse mouth out after each use  Use albuterol  inhaler 1-2 puffs or duonebs every 4-6 hours  Start Ohtuvayre  Nebulizer treatments twice daily  Schedule pulmonary function tests at the front desk  Follow up in 4 months with pulmonary function tests

## 2023-09-27 NOTE — Progress Notes (Signed)
 Synopsis: Referred in January 2022 for chronic respiratory failure  Subjective:   PATIENT ID: Lindsay [name] GENDER: female DOB: 05-23-1940, MRN: [id number]  HPI  Chief Complaint  Patient presents with   Medical Management of Chronic Issues    Pt states has some question and if you knwo about the watch man device that goes on arm    Lindsay [name] is a 83 year old woman, never smoker with chronic respiratory failure on 2L of supplemental oxygen  who returns to pulmonary clinic for follow up.  Over the past three to four days, she experiences increased shortness of breath, especially when moving from her recliner to the sink, necessitating rest due to significant dyspnea. There is no wheezing during these episodes. She has developed a cough, congestion, runny nose, and sore throat, which she attributes to a possible cold or sinus issues. Daily headaches are present, described as sinus-related and located in her face.  She manages foot swelling with torsemide , taking it twice daily and occasionally an additional dose at night. She is on Trelegy for COPD management. Previous courses of steroids for respiratory symptoms have resulted in significant improvement within two days.  She experiences difficulty swallowing, with food and medication getting stuck, leading to gagging episodes. A barium swallow test is planned. She participates in physical therapy and ambulates in the halls, taking breaks as needed.  OV 02/28/23 She has been experiencing a worsening cough and shortness of breath for the past five days. Initially, she delayed seeking medical attention, hoping for improvement, but now experiences significant shortness of breath with minimal exertion, such as walking from her recliner to the kitchen or bathroom. She describes right-sided chest pain that worsens with coughing or movement.  In January, she was prescribed an antibiotic and a steroid, which provided relief, but her symptoms have recurred  after completing the course. She has been off these medications for two to three weeks, and her symptoms have intensified over the last five days. She brings up clear mucus and has not experienced significant weight changes, noting only a fluctuation of about two pounds.  She uses a Trelegy inhaler once daily and nebulizer treatments, though she is uncertain of their effectiveness. She occasionally increases her oxygen  level to three when feeling breathless. No recent changes in her oxygen  needs and she is able to sleep on her side without difficulty.  She experiences leg swelling, which she manages with torsemide , taking 40 mg daily in the morning. She sometimes takes additional doses at night if her feet are swollen, but this is infrequent.  She moved from [place]  and has been experiencing challenges in accessing medical care in her new location. She does not have a current cardiologist and has been managing her torsemide  with her primary care provider. She has difficulty accessing a doctor since moving to the area, often seeing physician assistants instead.  OV 09/29/22 She was treated with prednisone  40mg  x 5 days and Zpak for recent call with increase of dyspnea, cough - mucous production and lower extremity edema. She was also instructed to increase her torsemide  for 3 days due to swelling. She is feeling better. She is complaining of scratchy hoarse voice especially after talking for any period of time. She has not had issues with her voice since being on trelegy.   She is interested in getting her flu vaccine today.  OV 05/30/22 She went to ER 05/21/22 for cough, dysphagia. Referred to GI. Placed on soft diet. She is feeling better with  less cough and upper airway congestion.   She has skin tear on left shin from 3 days ago.   OV 12/20/21 She was treated for asthma exacerbation 12/07/21 by Izetta Rouleau, NP likely secondary to viral infeciton. She was treated with prednisone  taper and  azithromycin . She is not feeling any better. She continues to have chest congestion and is having trouble coughing it out. She is using zyrtec  and mucinex . She continues on trelegy ellipta  1 puff daily. She also complains of decreased hearing of her right ear and sinus pressure/headaches.   OV 09/22/21 She was admitted to the hospital after last visit, 4/17 to 4/21 heart failure exacerbation. She was metapneumovirus postive on extended respiratory viral panel testing leading to acute on chronic resp failure and acute on chronic heart failure.  She reports feeling bad over the past 4-5 days with chest congestion, cough, fatigue, nausea and lower than normal blood pressure. She reports 2 covid cases at her [facility] center but denies direct exposure or contact. She denies fevers or chills. She has increased wheezing.   She is requesting a handicap placard.  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 There was also concern for asthma in which she was started on trelegy ellipta .   She reports increasing dyspnea and wheezing over the past 3 days. She has noted O2 saturations in the 70s when walking at home on 2L O2. She has not been able to walk as far recently and required a wheelchair to enter clinic today. Her SpO2 was confirmed in clinic to drop in the 70s with ambulation this morning. She has been using duoneb treatments every 6 hours and taking extra lasix  doses without improvement in her breathing. She also complains of painful urination and inreased urinary frequency over the past 2 days concerning for UTI. She reports falling at home and scraping her left elbow. She has been feeling lightheaded.   OV 02/17/2020 She reports she started having issues with her breathing 2 years ago after she suffered compound fractures in her back that led to an increase in the curvature of her thoracic spine. She was started on supplemental oxygen  shortly after  these events occurred. Per her chart she has exertional oxygen  desaturations to 87% from her physical therapy notes. She also reports cough and wheezing intermittently. She denies sputum production. She is a never smoker but lived with a smoker for 20 years. She denies history of pneumonias.   She has been started on trelegy ellipta  and she felt this helped her for a period of time but doesn't notice lasting relief from it anymore.   She has atrial fibrillation. She does have leg swelling and is taking torsemide .   She has received 2 covid vaccines. She received her pneumonia vaccine 2 weeks ago and she had the influenza vaccine this past fall.     She lived in [place]  before but moved to [place] as she had frequent falls and lived on her own. Her daughter lives here in town.   Past Medical History:  Diagnosis Date   Acute diastolic CHF (congestive heart failure) (HCC) 08/03/2020   Acute non-recurrent pansinusitis 10/21/2020   Acute on chronic diastolic CHF (congestive heart failure) (HCC) 08/03/2020   Acute on chronic respiratory failure with hypoxia (HCC) 08/02/2020   Acute on chronic respiratory failure with hypoxia and hypercapnia (HCC) 11/10/2022   Allergy    Anxiety    Asthma    Asthma exacerbation 08/02/2020  Atrial fibrillation (HCC)    Cataract    COPD (chronic obstructive pulmonary disease) (HCC)    Depression    Dysphagia 08/02/2020   Dyspnea 05/09/2021   Elevated troponin 09/14/2020   GERD (gastroesophageal reflux disease)    High cholesterol    Hospital discharge follow-up 11/28/2022   HTN (hypertension)    Hyperkalemia 09/14/2020   Hyponatremia 08/02/2020   Low grade squamous intraepithelial lesion on cytologic smear of cervix (LGSIL) 12/02/2019   Moderate persistent asthma without complication 12/02/2019   Non-healing wound of right lower extremity 08/01/2022   Osteoporosis    Oxygen  deficiency    Papule of skin 11/29/2022   Respiratory failure with  hypoxia (HCC)    Sinus congestion 11/29/2022   Sore throat 11/16/2020     Family History  Problem Relation Age of Onset   Breast cancer Sister    Hypertension Other      Social History   Socioeconomic History   Marital status: Divorced    Spouse name: Not on file   Number of children: Not on file   Years of education: Not on file   Highest education level: Some college, no degree  Occupational History   Not on file  Tobacco Use   Smoking status: Former    Types: Cigarettes   Smokeless tobacco: Never   Tobacco comments:    Quit remotely 1970s  Vaping Use   Vaping status: Never Used  Substance and Sexual Activity   Alcohol use: Not Currently   Drug use: Never   Sexual activity: Not Currently  Other Topics Concern   Not on file  Social History Narrative   Not on file   Social Drivers of Health   Financial Resource Strain: Medium Risk (07/25/2023)   Overall Financial Resource Strain (CARDIA)    Difficulty of Paying Living Expenses: Somewhat hard  Food Insecurity: No Food Insecurity (07/25/2023)   Hunger Vital Sign    Worried About Running Out of Food in the Last Year: Never true    Ran Out of Food in the Last Year: Never true  Recent Concern: Food Insecurity - Food Insecurity Present (07/20/2023)   Hunger Vital Sign    Worried About Running Out of Food in the Last Year: Sometimes true    Ran Out of Food in the Last Year: Sometimes true  Transportation Needs: Unmet Transportation Needs (07/25/2023)   PRAPARE - Transportation    Lack of Transportation (Medical): Yes    Lack of Transportation (Non-Medical): Yes  Physical Activity: Inactive (07/25/2023)   Exercise Vital Sign    Days of Exercise per Week: 0 days    Minutes of Exercise per Session: Not on file  Stress: Stress Concern Present (07/25/2023)   [employer] of Occupational Health - Occupational Stress Questionnaire    Feeling of Stress: To some extent  Social Connections: Socially Isolated (07/25/2023)    Social Connection and Isolation Panel    Frequency of Communication with Friends and Family: Once a week    Frequency of Social Gatherings with Friends and Family: Never    Attends Religious Services: 1 to 4 times per year    Active Member of [employer] or Organizations: No    Attends Banker Meetings: Not on file    Marital Status: Widowed  Intimate Partner Violence: Not At Risk (07/31/2023)   Humiliation, Afraid, Rape, and Kick questionnaire    Fear of Current or Ex-Partner: No    Emotionally Abused: No    Physically Abused: No  Sexually Abused: No     Allergies  Allergen Reactions   Acetaminophen -Codeine Other (See Comments)    Agitation   Naproxen-Esomeprazole Mg Diarrhea   Codeine Other (See Comments)    Hallucinations      Outpatient Medications Prior to Visit  Medication Sig Dispense Refill   acetaminophen  (TYLENOL ) 500 MG tablet Take 2 tablets (1,000 mg total) by mouth in the morning, at noon, and at bedtime. 21 tablet 0   alendronate  (FOSAMAX ) 70 MG tablet Take 1 tablet (70 mg total) by mouth once a week. Take with a full glass of water on an empty stomach. 12 tablet 1   apixaban  (ELIQUIS ) 5 MG TABS tablet Take 1 tablet (5 mg total) by mouth 2 (two) times daily. 180 tablet 1   atorvastatin  (LIPITOR) 40 MG tablet Take 1 tablet (40 mg total) by mouth daily. 90 tablet 1   Calcium -Magnesium -Vitamin D  (CALCIUM  1200+D3 PO) Take 1 tablet by mouth in the morning.     cetirizine  (ZYRTEC ) 10 MG tablet Take 1 tablet (10 mg total) by mouth daily. 90 tablet 1   Cyanocobalamin  (B-12) 1000 MCG SUBL Place 1 tablet under the tongue daily at 6 (six) AM. 90 tablet 1   Elastic Bandages & Supports (MEDICAL COMPRESSION STOCKINGS) MISC Two pair knee high 30-27mmHg compression stockings. Sized XL. As covered by insurance for ICD-10: I50.33, R60.0. 2 each 1   fluticasone  (FLONASE) 50 MCG/ACT nasal spray Place 1 spray into both nostrils 2 (two) times daily as needed for allergies or  rhinitis.     Fluticasone -Umeclidin-Vilant (TRELEGY ELLIPTA ) 100-62.5-25 MCG/ACT AEPB Inhale 1 puff into the lungs daily. 180 each 3   ipratropium-albuterol  (DUONEB) 0.5-2.5 (3) MG/3ML SOLN Inhale 3 mLs into the lungs every 6 (six) hours as needed (wheezing). 75 mL 5   LORazepam  (ATIVAN ) 1 MG tablet Take 1 tablet (1 mg total) by mouth 2 (two) times daily. 60 tablet 2   Menthol (COUGH DROPS) 5.8 MG LOZG Use as directed 1 lozenge (5.8 mg total) in the mouth or throat as needed (for dry, sore, or scratchy throat). 36 lozenge 11   metoprolol  tartrate (LOPRESSOR ) 25 MG tablet Take 1 tablet (25 mg total) by mouth 2 (two) times daily. 180 tablet 1   mirtazapine  (REMERON ) 45 MG tablet Take 1 tablet (45 mg total) by mouth at bedtime as needed (sleep). 90 tablet 3   montelukast (SINGULAIR) 10 MG tablet Take 10 mg by mouth at bedtime.     OXYGEN  Inhale 2 L/min into the lungs continuous.     pantoprazole  (PROTONIX ) 40 MG tablet Take 1 tablet (40 mg total) by mouth 2 (two) times daily before a meal. 180 tablet 0   sertraline  (ZOLOFT ) 100 MG tablet Take 1.5 tablets (150 mg total) by mouth at bedtime. 135 tablet 0   Spacer/Aero-Holding Chambers (AEROCHAMBER MV) inhaler Use as instructed 1 each 0   spironolactone  (ALDACTONE ) 25 MG tablet TAKE A HALF TABLET DAILY AS NEEDED FOR EDEMA OR FLUID RETENTION 30 tablet 0   torsemide  (DEMADEX ) 20 MG tablet TAKE 2 TABLETS DAILY 180 tablet 3   Vitamin D , Ergocalciferol , (DRISDOL ) 1.25 MG (50000 UNIT) CAPS capsule Take 1 capsule (50,000 Units total) by mouth every 7 (seven) days. 12 capsule 3   No facility-administered medications prior to visit.   Review of Systems  Constitutional:  Negative for chills, fever, malaise/fatigue and weight loss.  HENT:  Positive for sore throat. Negative for congestion, hearing loss and sinus pain.   Eyes: Negative.  Respiratory:  Positive for cough, sputum production, shortness of breath and wheezing. Negative for hemoptysis.    Cardiovascular:  Negative for chest pain, palpitations, orthopnea, claudication and leg swelling.  Gastrointestinal:  Negative for abdominal pain, heartburn, nausea and vomiting.  Genitourinary: Negative.   Musculoskeletal:  Negative for back pain, falls, joint pain and myalgias.  Skin:  Negative for rash.  Neurological:  Positive for headaches. Negative for dizziness and weakness.  Endo/Heme/Allergies: Negative.   Psychiatric/Behavioral: Negative.      Objective:   Vitals:   09/27/23 1302  BP: 100/80  Pulse: 74  SpO2: 93%  Weight: 222 lb (100.7 kg)  Height: 5' 2.5 (1.588 m)  PF: (!) 2 L/min    Physical Exam Constitutional:      General: She is not in acute distress.    Appearance: She is obese.  HENT:     Head: Normocephalic and atraumatic.  Eyes:     General: No scleral icterus.    Conjunctiva/sclera: Conjunctivae normal.  Cardiovascular:     Rate and Rhythm: Normal rate and regular rhythm.     Pulses: Normal pulses.     Heart sounds: Normal heart sounds. No murmur heard. Pulmonary:     Breath sounds: Decreased air movement present. No wheezing, rhonchi or rales.  Musculoskeletal:     Right lower leg: No edema.     Left lower leg: No edema.     Comments: Scoliosis noted  Skin:    General: Skin is warm and dry.  Neurological:     Mental Status: She is alert.    CBC    Component Value Date/Time   WBC 7.6 07/02/2023 1531   WBC 8.9 05/02/2023 2203   RBC 4.05 07/02/2023 1531   RBC 4.32 05/02/2023 2203   HGB 11.7 07/02/2023 1531   HCT 35.8 07/02/2023 1531   PLT 268 07/02/2023 1531   MCV 88 07/02/2023 1531   MCH 28.9 07/02/2023 1531   MCH 28.7 05/02/2023 2203   MCHC 32.7 07/02/2023 1531   MCHC 32.6 05/02/2023 2203   RDW 14.3 07/02/2023 1531   LYMPHSABS 1.1 07/02/2023 1531   MONOABS 0.7 05/02/2023 2203   EOSABS 0.1 07/02/2023 1531   BASOSABS 0.0 07/02/2023 1531      Latest Ref Rng & Units 07/02/2023    3:31 PM 06/04/2023    9:29 AM 05/02/2023   10:03 PM   BMP  Glucose 70 - 99 mg/dL 83  99  898   BUN 8 - 27 mg/dL 19  17  16    Creatinine 0.57 - 1.00 mg/dL 8.79  8.90  8.96   BUN/Creat Ratio 12 - 28 16  16     Sodium 134 - 144 mmol/L 142  142  136   Potassium 3.5 - 5.2 mmol/L 4.0  3.7  3.8   Chloride 96 - 106 mmol/L 98  97  95   CO2 20 - 29 mmol/L 27  27  26    Calcium  8.7 - 10.3 mg/dL 9.2  9.4  9.3    Chest imaging: CTA Chest 05/21/22 1. No pulmonary embolus. 2. Cardiomegaly 3. Stable aneurysmal aortic arch (4 cm). Recommend semi-annual imaging followup by CTA or MRA and referral to cardiothoracic surgery if not already obtained. This recommendation follows 2010 ACCF/AHA/AATS/ACR/ASA/SCA/SCAI/SIR/STS/SVM Guidelines for the Diagnosis and Management of Patients With Thoracic Aortic Disease. Circulation. 2010; 121: Z733-z63. Aortic aneurysm NOS (ICD10-I71.9). 4. No acute intrapulmonary abnormality. 5. Aortic Atherosclerosis (ICD10-I70.0) with at least 2 vessel coronary artery calcifications.  CXR 12/07/21  Frontal and lateral views of the chest demonstrate an enlarged cardiac silhouette. There is ectasia of the thoracic aorta. There is increased central vascular congestion without overt edema. No effusion or pneumothorax. The lungs remain hyperinflated. Multiple thoracic compression deformities are again seen, with exaggerated thoracic kyphosis.  CXR 12/16/19 The lungs are under aerated with bronchovascular crowding and interstitial prominence. Increased AP diameter on the lateral view. No focal airspace opacities.  Minimal blunting of both costophrenic angles.  Mild enlargement of the cardiac silhouette.  Prominent central vessels. The thoracic aorta is markedly tortuous. Moderate scattered bony degenerative changes. Increased kyphosis with decreased height of numerous thoracic vertebral bodies. Levoscoliosis of the upper thoracic spine. Diffuse decreased bone density.  PFT:     No data to display          Labs: Reviewed from  [facility] 1.11 on 12/02/19 Normal CBC  Assessment & Plan:   Chronic respiratory failure with hypoxia (HCC) - Plan: Pulmonary Function Test  Persistent atrial fibrillation (HCC) - Plan: Ambulatory referral to Cardiology  Acute non-recurrent frontal sinusitis - Plan: amoxicillin -clavulanate (AUGMENTIN ) 875-125 MG tablet, predniSONE  (DELTASONE ) 10 MG tablet  Discussion: Analy Bassford is a 83 year old woman, never smoker with chronic respiratory failure on 2L of supplemental oxygen  who returns to pulmonary clinic for asthma.  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 Take with a full glass of water on an empty stomach., Disp: 12 tablet, Rfl: 1   amoxicillin -clavulanate (AUGMENTIN ) 875-125 MG tablet, Take 1 tablet by mouth 2 (two) times daily., Disp: 14 tablet, Rfl: 0   apixaban  (ELIQUIS ) 5 MG TABS tablet, Take 1 tablet (5 mg total) by mouth 2 (two) times daily., Disp: 180 tablet, Rfl: 1   atorvastatin  (LIPITOR) 40 MG tablet, Take 1 tablet (40 mg total) by mouth daily., Disp: 90 tablet, Rfl: 1   Calcium -Magnesium -Vitamin D  (CALCIUM  1200+D3 PO), Take 1 tablet by mouth in the morning., Disp: , Rfl:    cetirizine  (ZYRTEC ) 10 MG tablet, Take 1 tablet (10 mg  total) by mouth daily., Disp: 90 tablet, Rfl: 1   Cyanocobalamin  (B-12) 1000 MCG SUBL, Place 1 tablet under the tongue daily at 6 (six) AM., Disp: 90 tablet, Rfl: 1   Elastic Bandages & Supports (MEDICAL COMPRESSION STOCKINGS) MISC, Two pair knee high 30-85mmHg compression stockings. Sized XL. As covered by insurance for ICD-10: I50.33, R60.0., Disp: 2 each, Rfl: 1   fluticasone  (FLONASE) 50 MCG/ACT nasal spray, Place 1 spray into both nostrils 2 (two) times daily as needed for allergies or rhinitis., Disp: , Rfl:    Fluticasone -Umeclidin-Vilant (TRELEGY ELLIPTA ) 100-62.5-25 MCG/ACT AEPB, Inhale 1 puff into the lungs daily., Disp: 180 each, Rfl: 3   ipratropium-albuterol  (DUONEB) 0.5-2.5 (3) MG/3ML SOLN, Inhale 3 mLs into the lungs every 6 (  six) hours as needed (wheezing)., Disp: 75 mL, Rfl: 5   LORazepam  (ATIVAN ) 1 MG tablet, Take 1 tablet (1 mg total) by mouth 2 (two) times daily., Disp: 60 tablet, Rfl: 2   Menthol (COUGH DROPS) 5.8 MG LOZG, Use as directed 1 lozenge (5.8 mg total) in the mouth or throat as needed (for dry, sore, or scratchy throat)., Disp: 36 lozenge, Rfl: 11   metoprolol  tartrate (LOPRESSOR ) 25 MG tablet, Take 1 tablet (25 mg total) by mouth 2 (two) times daily., Disp: 180 tablet, Rfl: 1   mirtazapine  (REMERON ) 45 MG tablet, Take 1 tablet (45 mg total) by mouth at bedtime as needed (sleep)., Disp: 90 tablet, Rfl: 3   montelukast (SINGULAIR) 10 MG tablet, Take 10 mg by mouth at bedtime., Disp: , Rfl:    OXYGEN , Inhale 2 L/min into the lungs continuous., Disp: , Rfl:    pantoprazole  (PROTONIX ) 40 MG tablet, Take 1 tablet (40 mg total) by mouth 2 (two) times daily before a meal., Disp: 180 tablet, Rfl: 0   predniSONE  (DELTASONE ) 10 MG tablet, Take 2 tablets (20 mg total) by mouth daily with breakfast., Disp: 14 tablet, Rfl: 0   sertraline  (ZOLOFT ) 100 MG tablet, Take 1.5 tablets (150 mg total) by mouth at bedtime., Disp: 135 tablet, Rfl: 0   Spacer/Aero-Holding Chambers (AEROCHAMBER  MV) inhaler, Use as instructed, Disp: 1 each, Rfl: 0   spironolactone  (ALDACTONE ) 25 MG tablet, TAKE A HALF TABLET DAILY AS NEEDED FOR EDEMA OR FLUID RETENTION, Disp: 30 tablet, Rfl: 0   torsemide  (DEMADEX ) 20 MG tablet, TAKE 2 TABLETS DAILY, Disp: 180 tablet, Rfl: 3   Vitamin D , Ergocalciferol , (DRISDOL ) 1.25 MG (50000 UNIT) CAPS capsule, Take 1 capsule (50,000 Units total) by mouth every 7 (seven) days., Disp: 12 capsule, Rfl: 3

## 2023-09-27 NOTE — Progress Notes (Signed)
   09/27/2023  Patient ID: Dagoberto Pass, female   DOB: [birth year], 83 y.o.   MRN: [id number]  Attempted to contact patient to reschedule her missed appt for medication management. Left HIPAA compliant message for patient to return my call at their convenience. Sending mychart message also to request a return call.  Jon VEAR Lindau, PharmD Clinical Pharmacist [phone]

## 2023-09-28 ENCOUNTER — Ambulatory Visit: Payer: Self-pay | Admitting: Pulmonary Disease

## 2023-09-28 DIAGNOSIS — J0110 Acute frontal sinusitis, unspecified: Secondary | ICD-10-CM

## 2023-09-28 DIAGNOSIS — M62511 Muscle wasting and atrophy, not elsewhere classified, right shoulder: Secondary | ICD-10-CM | POA: Diagnosis not present

## 2023-09-28 DIAGNOSIS — R278 Other lack of coordination: Secondary | ICD-10-CM | POA: Diagnosis not present

## 2023-09-28 DIAGNOSIS — J441 Chronic obstructive pulmonary disease with (acute) exacerbation: Secondary | ICD-10-CM

## 2023-09-28 DIAGNOSIS — J4540 Moderate persistent asthma, uncomplicated: Secondary | ICD-10-CM

## 2023-09-28 DIAGNOSIS — J9621 Acute and chronic respiratory failure with hypoxia: Secondary | ICD-10-CM

## 2023-09-28 DIAGNOSIS — R296 Repeated falls: Secondary | ICD-10-CM | POA: Diagnosis not present

## 2023-09-28 DIAGNOSIS — M62512 Muscle wasting and atrophy, not elsewhere classified, left shoulder: Secondary | ICD-10-CM | POA: Diagnosis not present

## 2023-09-28 MED ORDER — AMOXICILLIN-POT CLAVULANATE 875-125 MG PO TABS: 1.0000 | ORAL_TABLET | Freq: Two times a day (BID) | ORAL | 0 refills | Status: DC

## 2023-09-28 MED ORDER — PREDNISONE 10 MG PO TABS
20.0000 mg | ORAL_TABLET | Freq: Every day | ORAL | 0 refills | Status: DC
Start: 2023-09-28 — End: 2023-10-25

## 2023-09-28 NOTE — Telephone Encounter (Signed)
 Called and spoke to on-call provider Hattar [phone], spoke to Dr. Zaida. Nurse pending refills to correct pharmacy. Dr. Zaida will fill asap.   Called and informed pt.

## 2023-09-28 NOTE — Telephone Encounter (Signed)
 FYI Only or Action Required?: Action required by provider: Needs meds sent to different pharmacy asap, spoke to on-call provider.  Patient is followed in Pulmonology for COPD, last seen on 09/27/2023 by [name], MD.  Called Nurse Triage reporting Medication Problem.  Symptoms are: unchanged.  Triage Disposition: Call PCP Now  Patient/caregiver understands and will follow disposition?: Yes      Copied from CRM [id number]. Topic: Clinical - Red Word Triage >> Sep 28, 2023  3:43 PM Leila C wrote: Red Word that prompted transfer to Nurse Triage: Patient [phone] states needs medications: predniSONE  (DELTASONE ) 10 MG tablet and amoxicillin -clavulanate (AUGMENTIN ) 875-125 MG tablet from [facility] and will not be delivered until Monday. Patient asked if medications can be changed to a local pharmay and cancel with [facility]. Patient states more shortness of breath and cough that's why she saw Dr. Kara yesterday and was prescribe antibiotics. Please advise.   [facility] [place], [place] -  [address] [place] [address] Phone: [phone] Fax: [phone] Reason for Disposition  [1] Caller has URGENT medicine question about med that primary care doctor (or NP/PA) or specialist prescribed AND [2] triager unable to answer question  Answer Assessment - Initial Assessment Questions 1. NAME of MEDICINE: What medicine(s) are you calling about?     Augmentin  and deltasone  2. QUESTION: What is your question? (e.g., double dose of medicine, side effect)     Seen by Dr. Kara, prescribed to First time getting meds from [facility], need meds today, it's on the way but will not be here until Monday, really need it this weekend, steroid and antibx to help with breathing 3. PRESCRIBER: Who prescribed the medicine? Reason: if prescribed by specialist, call should be referred to that group.     Dr. Kara 4. SYMPTOMS: Do you have any symptoms?  If Yes, ask: What symptoms are you having?  How bad are the symptoms (e.g., mild, moderate, severe)      No worsening symptoms but having hard time breathing and have bad cough, COPD, not worse but not better Willing to get another      Got caught in something didn't think about or know about [facility] doesn't deliver but daughter can go by and pick it up tonight   Advised that nurse calling on-call provider with request, pt requesting call back if able to send. Advised go to UC for new scripts if needed if no word back today, advised seek immediate care at ED if worsening symptoms.  Protocols used: Medication Question Call-A-AH

## 2023-10-01 ENCOUNTER — Telehealth: Payer: Self-pay

## 2023-10-01 MED ORDER — TRELEGY ELLIPTA 100-62.5-25 MCG/ACT IN AEPB: 1.0000 | INHALATION_SPRAY | Freq: Every day | RESPIRATORY_TRACT | 3 refills | Status: AC

## 2023-10-01 MED ORDER — TRELEGY ELLIPTA 100-62.5-25 MCG/ACT IN AEPB: 1.0000 | INHALATION_SPRAY | Freq: Every day | RESPIRATORY_TRACT | 3 refills | Status: DC

## 2023-10-01 MED ORDER — IPRATROPIUM-ALBUTEROL 0.5-2.5 (3) MG/3ML IN SOLN
3.0000 mL | Freq: Four times a day (QID) | RESPIRATORY_TRACT | 5 refills | Status: DC | PRN
Start: 2023-10-01 — End: 2023-10-23

## 2023-10-01 NOTE — Addendum Note (Signed)
 Addended byBETHA FRIES, Merial Moritz A on: 10/01/2023 09:12 AM   Modules accepted: Orders

## 2023-10-01 NOTE — Telephone Encounter (Signed)
 Received Ohtuv[employer]  new start paperwork. Completed form and faxed with clinicals and insurance card copy to [facility]   Phone#: [phone] Fax#: [phone]

## 2023-10-01 NOTE — Telephone Encounter (Signed)
 Called and spoke with the pt. She has picked up her prednisone  and augmentin  called in by Dr. Zaida Pt was requesting trelegy inhaler and Duoneb be sent to [facility] pharmacy as it is cheaper. I have sent these rx to pt preferred pharmacy. Nfn

## 2023-10-01 NOTE — Telephone Encounter (Signed)
 Received fax from [employer] confirming receipt of Ohtuvayre  enrollment form  Patient ID: [id number]

## 2023-10-01 NOTE — Progress Notes (Deleted)
 Last eye exam: Flu shot:   [name], DNP, AGNP-c [facility]  [address] [place], [place] [address] [phone]  ESTABLISHED PATIENT- Chronic Health and/or Follow-Up Visit on 10/02/2023  There were no vitals taken for this visit.   HPI: .[email or web address]  All ROS negative with exception of what is listed above.  {health maintenance:315237}  PHYSICAL EXAM Physical Exam  PLAN Problem List Items Addressed This Visit   None    No follow-ups on file.  [name] , DNP, AGNP-c Time: *** minutes, >50% spent counseling, care coordination, chart review, and documentation.  SABRAtime

## 2023-10-02 ENCOUNTER — Encounter: Admitting: Nurse Practitioner

## 2023-10-02 ENCOUNTER — Telehealth: Payer: Self-pay

## 2023-10-02 DIAGNOSIS — I4819 Other persistent atrial fibrillation: Secondary | ICD-10-CM

## 2023-10-02 DIAGNOSIS — E119 Type 2 diabetes mellitus without complications: Secondary | ICD-10-CM | POA: Insufficient documentation

## 2023-10-02 NOTE — Progress Notes (Signed)
   10/02/2023  Patient ID: Lindsay [name], female   DOB: [birth year], 83 y.o.   MRN: [id number]  Contacted patient via telephone to discuss approval of Xarelto through [employer].  Patient has been approved and confirms she has received her first shipment of 90ds of xarelto from the company. Has started taking and has stopped her xarelto. She still has some eliquis  as it came due for refill right before the xarelto arrived at her house and she is aware she is NOT to take both.  Is aware that Xarelto is ONCE daily instead of TWICE daily. She asks that I notify [employer] not to fill eliquis  or trelegy unless she requests because she gets Trelegy via [employer]. Notified pharmacy.  Follow Up: 12/06/23  Jon VEAR Lindau, PharmD Clinical Pharmacist [phone]

## 2023-10-02 NOTE — Telephone Encounter (Signed)
 Received fax from [employer] with summary of benefits. Referral form for Ohtuvayre  received. Rx will be triaged to [facility].. Once benefits investigation completed, pharmacy will reach out the patient to schedule shipment. If medication is unaffordable, patient will need to express financial hardship to be referred back to [place] [facility] for patient assistance program pre-screening.   Patient ID: [id number] Pharmacy phone: [phone] [place] [facility] Phone#: [phone]

## 2023-10-03 DIAGNOSIS — R296 Repeated falls: Secondary | ICD-10-CM | POA: Diagnosis not present

## 2023-10-03 DIAGNOSIS — M62512 Muscle wasting and atrophy, not elsewhere classified, left shoulder: Secondary | ICD-10-CM | POA: Diagnosis not present

## 2023-10-03 DIAGNOSIS — M62511 Muscle wasting and atrophy, not elsewhere classified, right shoulder: Secondary | ICD-10-CM | POA: Diagnosis not present

## 2023-10-03 DIAGNOSIS — R278 Other lack of coordination: Secondary | ICD-10-CM | POA: Diagnosis not present

## 2023-10-08 ENCOUNTER — Ambulatory Visit: Payer: Self-pay | Admitting: Physician Assistant

## 2023-10-08 ENCOUNTER — Ambulatory Visit (HOSPITAL_COMMUNITY)
Admission: RE | Admit: 2023-10-08 | Discharge: 2023-10-08 | Disposition: A | Discharge status: Home / Self Care | Source: Ambulatory Visit | Attending: Physician Assistant | Admitting: Physician Assistant

## 2023-10-08 DIAGNOSIS — R1310 Dysphagia, unspecified: Secondary | ICD-10-CM | POA: Diagnosis not present

## 2023-10-08 DIAGNOSIS — R1319 Other dysphagia: Secondary | ICD-10-CM | POA: Insufficient documentation

## 2023-10-08 DIAGNOSIS — K224 Dyskinesia of esophagus: Secondary | ICD-10-CM | POA: Diagnosis not present

## 2023-10-10 DIAGNOSIS — M62512 Muscle wasting and atrophy, not elsewhere classified, left shoulder: Secondary | ICD-10-CM | POA: Diagnosis not present

## 2023-10-10 DIAGNOSIS — R296 Repeated falls: Secondary | ICD-10-CM | POA: Diagnosis not present

## 2023-10-10 DIAGNOSIS — M62511 Muscle wasting and atrophy, not elsewhere classified, right shoulder: Secondary | ICD-10-CM | POA: Diagnosis not present

## 2023-10-12 DIAGNOSIS — R0602 Shortness of breath: Secondary | ICD-10-CM | POA: Diagnosis not present

## 2023-10-15 ENCOUNTER — Encounter: Payer: Self-pay | Admitting: Nurse Practitioner

## 2023-10-15 ENCOUNTER — Other Ambulatory Visit: Payer: Self-pay | Admitting: Physician Assistant

## 2023-10-15 ENCOUNTER — Encounter (HOSPITAL_BASED_OUTPATIENT_CLINIC_OR_DEPARTMENT_OTHER): Payer: Self-pay

## 2023-10-15 DIAGNOSIS — F411 Generalized anxiety disorder: Secondary | ICD-10-CM

## 2023-10-15 MED ORDER — SPIRONOLACTONE 25 MG PO TABS: ORAL_TABLET | ORAL | 2 refills | Status: DC

## 2023-10-16 MED ORDER — LORAZEPAM 1 MG PO TABS: 1.0000 mg | ORAL_TABLET | Freq: Two times a day (BID) | ORAL | 0 refills | Status: AC

## 2023-10-16 NOTE — Telephone Encounter (Signed)
 I have sent this in as a 90 day script. Some insurance companies will not allow that much at one time, so we will see what they approve.

## 2023-10-17 DIAGNOSIS — M62511 Muscle wasting and atrophy, not elsewhere classified, right shoulder: Secondary | ICD-10-CM | POA: Diagnosis not present

## 2023-10-17 DIAGNOSIS — M62512 Muscle wasting and atrophy, not elsewhere classified, left shoulder: Secondary | ICD-10-CM | POA: Diagnosis not present

## 2023-10-17 DIAGNOSIS — R278 Other lack of coordination: Secondary | ICD-10-CM | POA: Diagnosis not present

## 2023-10-17 DIAGNOSIS — R296 Repeated falls: Secondary | ICD-10-CM | POA: Diagnosis not present

## 2023-10-22 ENCOUNTER — Encounter (HOSPITAL_BASED_OUTPATIENT_CLINIC_OR_DEPARTMENT_OTHER): Payer: Self-pay

## 2023-10-22 ENCOUNTER — Other Ambulatory Visit (HOSPITAL_COMMUNITY): Payer: Self-pay

## 2023-10-22 ENCOUNTER — Encounter: Payer: Self-pay | Admitting: Nurse Practitioner

## 2023-10-23 ENCOUNTER — Other Ambulatory Visit (HOSPITAL_BASED_OUTPATIENT_CLINIC_OR_DEPARTMENT_OTHER): Payer: Self-pay

## 2023-10-23 ENCOUNTER — Telehealth: Payer: Self-pay | Admitting: Nurse Practitioner

## 2023-10-23 ENCOUNTER — Other Ambulatory Visit: Payer: Self-pay

## 2023-10-23 ENCOUNTER — Other Ambulatory Visit (HOSPITAL_COMMUNITY): Payer: Self-pay

## 2023-10-23 ENCOUNTER — Other Ambulatory Visit: Payer: Self-pay | Admitting: *Deleted

## 2023-10-23 ENCOUNTER — Encounter (HOSPITAL_BASED_OUTPATIENT_CLINIC_OR_DEPARTMENT_OTHER): Payer: Self-pay

## 2023-10-23 DIAGNOSIS — E1169 Type 2 diabetes mellitus with other specified complication: Secondary | ICD-10-CM

## 2023-10-23 DIAGNOSIS — J9621 Acute and chronic respiratory failure with hypoxia: Secondary | ICD-10-CM

## 2023-10-23 DIAGNOSIS — J441 Chronic obstructive pulmonary disease with (acute) exacerbation: Secondary | ICD-10-CM

## 2023-10-23 DIAGNOSIS — I5032 Chronic diastolic (congestive) heart failure: Secondary | ICD-10-CM

## 2023-10-23 MED ORDER — ATORVASTATIN CALCIUM 40 MG PO TABS
40.0000 mg | ORAL_TABLET | Freq: Every day | ORAL | 1 refills | Status: DC
  Filled 2023-10-23: qty 90, 90d supply, fill #0

## 2023-10-23 MED ORDER — MONTELUKAST SODIUM 10 MG PO TABS
10.0000 mg | ORAL_TABLET | Freq: Every day | ORAL | 11 refills | Status: AC
  Filled 2023-10-23: qty 30, 30d supply, fill #0

## 2023-10-23 MED ORDER — TRELEGY ELLIPTA 100-62.5-25 MCG/ACT IN AEPB: 1.0000 | INHALATION_SPRAY | Freq: Every day | RESPIRATORY_TRACT | 4 refills | Status: DC

## 2023-10-23 MED ORDER — FLUTICASONE PROPIONATE 50 MCG/ACT NA SUSP
1.0000 | Freq: Two times a day (BID) | NASAL | 11 refills | Status: AC | PRN
  Filled 2023-10-23: qty 16, 30d supply, fill #0

## 2023-10-23 MED ORDER — IPRATROPIUM-ALBUTEROL 0.5-2.5 (3) MG/3ML IN SOLN
3.0000 mL | Freq: Four times a day (QID) | RESPIRATORY_TRACT | 5 refills | Status: AC | PRN
  Filled 2023-10-23: qty 90, 8d supply, fill #0

## 2023-10-23 MED ORDER — METOPROLOL TARTRATE 25 MG PO TABS
25.0000 mg | ORAL_TABLET | Freq: Two times a day (BID) | ORAL | 1 refills | Status: DC
  Filled 2023-10-23: qty 180, 90d supply, fill #0

## 2023-10-23 NOTE — Telephone Encounter (Signed)
 Copied from CRM #[id number]. Topic: Clinical - Medication Refill >> Oct 23, 2023  1:38 PM Nathanel DEL wrote: Medication: Fluticasone -Umeclidin-Vilant (TRELEGY ELLIPTA ) 100-62.5-25 MCG/ACT AEPB  Has the patient contacted their pharmacy? yes Pt gets through [facility]. She said she is approved through Dec 2025, they told her they only need a new Rx from the dr This is the patient's preferred pharmacy:  [facility] Is this the correct pharmacy for this prescription? No If no, delete pharmacy and type the correct one.   Has the prescription been filled recently? Yes  Is the patient out of the medication? No  Has the patient been seen for an appointment in the last year OR does the patient have an upcoming appointment? Yes  Can we respond through MyChart? No

## 2023-10-23 NOTE — Telephone Encounter (Signed)
 New prescription for Trelegy 100 printed for Dr. Kara to sign and placed on desk in A pod for his signature.

## 2023-10-23 NOTE — Telephone Encounter (Signed)
 Prescription faxed to [facility] [phone].  Received confirmation fax that it was sent successfully.  Called and spoke with her daughter, Burnard [facility]), advised that a new prescription has been sent to [facility] patient assistance for her Trelegy.  She verbalized understanding.  While I was on the phone, she said she had changed pharmacies to [facility] and they are the only one that does blister packs.  She said the pharmacy need new prescriptions since there were no refills for her medications.  Advised I would send the refills over.  She verbalized understanding.

## 2023-10-24 ENCOUNTER — Other Ambulatory Visit (HOSPITAL_COMMUNITY): Payer: Self-pay

## 2023-10-24 ENCOUNTER — Other Ambulatory Visit: Payer: Self-pay

## 2023-10-24 ENCOUNTER — Other Ambulatory Visit (HOSPITAL_BASED_OUTPATIENT_CLINIC_OR_DEPARTMENT_OTHER): Payer: Self-pay

## 2023-10-24 DIAGNOSIS — M62511 Muscle wasting and atrophy, not elsewhere classified, right shoulder: Secondary | ICD-10-CM | POA: Diagnosis not present

## 2023-10-24 DIAGNOSIS — J302 Other seasonal allergic rhinitis: Secondary | ICD-10-CM

## 2023-10-24 DIAGNOSIS — R296 Repeated falls: Secondary | ICD-10-CM | POA: Diagnosis not present

## 2023-10-24 DIAGNOSIS — E559 Vitamin D deficiency, unspecified: Secondary | ICD-10-CM

## 2023-10-24 DIAGNOSIS — J4521 Mild intermittent asthma with (acute) exacerbation: Secondary | ICD-10-CM

## 2023-10-24 DIAGNOSIS — F3341 Major depressive disorder, recurrent, in partial remission: Secondary | ICD-10-CM

## 2023-10-24 DIAGNOSIS — F411 Generalized anxiety disorder: Secondary | ICD-10-CM

## 2023-10-24 DIAGNOSIS — R278 Other lack of coordination: Secondary | ICD-10-CM | POA: Diagnosis not present

## 2023-10-24 DIAGNOSIS — D518 Other vitamin B12 deficiency anemias: Secondary | ICD-10-CM

## 2023-10-24 DIAGNOSIS — Z09 Encounter for follow-up examination after completed treatment for conditions other than malignant neoplasm: Secondary | ICD-10-CM

## 2023-10-24 DIAGNOSIS — M810 Age-related osteoporosis without current pathological fracture: Secondary | ICD-10-CM

## 2023-10-24 DIAGNOSIS — M62512 Muscle wasting and atrophy, not elsewhere classified, left shoulder: Secondary | ICD-10-CM | POA: Diagnosis not present

## 2023-10-24 MED ORDER — METOPROLOL TARTRATE 25 MG PO TABS
25.0000 mg | ORAL_TABLET | Freq: Two times a day (BID) | ORAL | 1 refills | Status: AC
  Filled 2023-10-24: qty 180, 90d supply, fill #0

## 2023-10-24 MED ORDER — CETIRIZINE HCL 10 MG PO TABS
10.0000 mg | ORAL_TABLET | Freq: Every day | ORAL | 1 refills | Status: DC
  Filled 2023-10-24: qty 90, 90d supply, fill #0

## 2023-10-24 MED ORDER — VITAMIN D (ERGOCALCIFEROL) 1.25 MG (50000 UNIT) PO CAPS
50000.0000 [IU] | ORAL_CAPSULE | ORAL | 1 refills | Status: DC
  Filled 2023-10-24: qty 12, 84d supply, fill #0

## 2023-10-24 MED ORDER — ALENDRONATE SODIUM 70 MG PO TABS
70.0000 mg | ORAL_TABLET | ORAL | 1 refills | Status: DC
  Filled 2023-10-24: qty 12, 84d supply, fill #0

## 2023-10-24 MED ORDER — B-12 1000 MCG SL SUBL
1.0000 | SUBLINGUAL_TABLET | Freq: Every day | SUBLINGUAL | 1 refills | Status: DC
  Filled 2023-10-24: qty 90, fill #0

## 2023-10-24 MED ORDER — SERTRALINE HCL 100 MG PO TABS
150.0000 mg | ORAL_TABLET | Freq: Every day | ORAL | 0 refills | Status: DC
  Filled 2023-10-24: qty 135, 90d supply, fill #0

## 2023-10-24 MED ORDER — SPIRONOLACTONE 25 MG PO TABS
ORAL_TABLET | ORAL | 2 refills | Status: AC
  Filled 2023-10-24: qty 45, 90d supply, fill #0

## 2023-10-24 MED ORDER — TORSEMIDE 20 MG PO TABS
ORAL_TABLET | ORAL | 3 refills | Status: AC
  Filled 2023-10-24: qty 180, 90d supply, fill #0

## 2023-10-24 MED ORDER — ATORVASTATIN CALCIUM 40 MG PO TABS
40.0000 mg | ORAL_TABLET | Freq: Every day | ORAL | 1 refills | Status: AC
  Filled 2023-10-24: qty 90, 90d supply, fill #0

## 2023-10-25 ENCOUNTER — Other Ambulatory Visit (HOSPITAL_COMMUNITY): Payer: Self-pay

## 2023-10-25 ENCOUNTER — Other Ambulatory Visit: Payer: Self-pay

## 2023-10-25 ENCOUNTER — Other Ambulatory Visit (HOSPITAL_BASED_OUTPATIENT_CLINIC_OR_DEPARTMENT_OTHER): Payer: Self-pay

## 2023-10-25 ENCOUNTER — Encounter: Payer: Self-pay | Admitting: Pharmacist

## 2023-10-25 ENCOUNTER — Ambulatory Visit: Payer: Self-pay

## 2023-10-25 ENCOUNTER — Encounter: Payer: Self-pay | Admitting: Licensed Clinical Social Worker

## 2023-10-25 DIAGNOSIS — J441 Chronic obstructive pulmonary disease with (acute) exacerbation: Secondary | ICD-10-CM

## 2023-10-25 MED ORDER — AZITHROMYCIN 250 MG PO TABS
ORAL_TABLET | ORAL | 0 refills | Status: DC
  Filled 2023-10-25: qty 6, 5d supply, fill #0

## 2023-10-25 MED ORDER — PREDNISONE 10 MG PO TABS
ORAL_TABLET | ORAL | 0 refills | Status: AC
  Filled 2023-10-25: qty 30, 12d supply, fill #0

## 2023-10-25 NOTE — Telephone Encounter (Signed)
 Dr. Kara,  SOB and cannot come in to the office.  She was seen on 09/27/23.  Patient is requesting an antibiotic and prednisone .  Please advise.  Thank you.

## 2023-10-25 NOTE — Telephone Encounter (Signed)
 Zpak and pred sent in  JD

## 2023-10-25 NOTE — Telephone Encounter (Signed)
 FYI Only or Action Required?: Action required by provider: clinical question for provider.  Patient was last seen in primary care on 07/02/2023 by Early, [name], NP.  Called Nurse Triage reporting Shortness of Breath.  Symptoms began several days ago.  Interventions attempted: OTC medications: Mucinex .  Symptoms are: gradually worsening.Pt. Asking for antibiotic and prednisone . I can't get in to the office. Please advise pt.  Triage Disposition: See HCP Within 4 Hours (Or PCP Triage)  Patient/caregiver understands and will follow disposition?: No, wishes to speak with PCPCopied from CRM #[id number]. Topic: Clinical - Red Word Triage >> Oct 25, 2023 10:05 AM Rozanna MATSU wrote: Red Word that prompted transfer to Nurse Triage: shortness of breath, congestion, coughing up mucus greenish /clear Reason for Disposition  [1] MILD difficulty breathing (e.g., minimal/no SOB at rest, SOB with walking, pulse < 100) AND [2] still present when not coughing  Answer Assessment - Initial Assessment Questions 1. ONSET: When did the cough begin?      awile 2. SEVERITY: How bad is the cough today?      severe 3. SPUTUM: Describe the color of your sputum (e.g., none, dry cough; clear, white, yellow, green)     green 4. HEMOPTYSIS: Are you coughing up any blood? If Yes, ask: How much? (e.g., flecks, streaks, tablespoons, etc.)     no 5. DIFFICULTY BREATHING: Are you having difficulty breathing? If Yes, ask: How bad is it? (e.g., mild, moderate, severe)      mild 6. FEVER: Do you have a fever? If Yes, ask: What is your temperature, how was it measured, and when did it start?     no 7. CARDIAC HISTORY: Do you have any history of heart disease? (e.g., heart attack, congestive heart failure)      A fib 8. LUNG HISTORY: Do you have any history of lung disease?  (e.g., pulmonary embolus, asthma, emphysema)     yes 9. PE RISK FACTORS: Do you have a history of blood clots? (or: recent major  surgery, recent prolonged travel, bedridden)     no 10. OTHER SYMPTOMS: Do you have any other symptoms? (e.g., runny nose, wheezing, chest pain)       wheezing 11. PREGNANCY: Is there any chance you are pregnant? When was your last menstrual period?       no 12. TRAVEL: Have you traveled out of the country in the last month? (e.g., travel history, exposures)       no  Protocols used: Cough - Acute Productive-A-AH

## 2023-10-26 ENCOUNTER — Telehealth: Payer: Self-pay

## 2023-10-26 ENCOUNTER — Other Ambulatory Visit: Payer: Self-pay

## 2023-10-26 ENCOUNTER — Other Ambulatory Visit: Payer: Self-pay | Admitting: Nurse Practitioner

## 2023-10-26 ENCOUNTER — Other Ambulatory Visit (HOSPITAL_COMMUNITY): Payer: Self-pay

## 2023-10-26 DIAGNOSIS — I4819 Other persistent atrial fibrillation: Secondary | ICD-10-CM

## 2023-10-26 DIAGNOSIS — E1169 Type 2 diabetes mellitus with other specified complication: Secondary | ICD-10-CM

## 2023-10-26 DIAGNOSIS — F3341 Major depressive disorder, recurrent, in partial remission: Secondary | ICD-10-CM

## 2023-10-26 DIAGNOSIS — I5032 Chronic diastolic (congestive) heart failure: Secondary | ICD-10-CM

## 2023-10-26 DIAGNOSIS — I152 Hypertension secondary to endocrine disorders: Secondary | ICD-10-CM

## 2023-10-26 DIAGNOSIS — Z9981 Dependence on supplemental oxygen: Secondary | ICD-10-CM

## 2023-10-26 DIAGNOSIS — J9611 Chronic respiratory failure with hypoxia: Secondary | ICD-10-CM

## 2023-10-26 DIAGNOSIS — J441 Chronic obstructive pulmonary disease with (acute) exacerbation: Secondary | ICD-10-CM

## 2023-10-26 NOTE — Progress Notes (Signed)
 Complex Care Management Note Care Guide Note  10/26/2023 Name: Lindsay [name] MRN: [id number] DOB: 12-26-1940   Complex Care Management Outreach Attempts: An unsuccessful telephone outreach was attempted today to offer the patient information about available complex care management services.  Follow Up Plan:  Additional outreach attempts will be made to offer the patient complex care management information and services.   Encounter Outcome:  No Answer- No Voicemail- Unable to leave a message   Leotis Rase [facility], [facility] Guide  Direct Dial: [phone]  Fax [phone]

## 2023-10-29 NOTE — Progress Notes (Unsigned)
 Complex Care Management Note Care Guide Note  10/29/2023 Name: Lindsay [name] MRN: [id number] DOB: 08-Apr-1940   Complex Care Management Outreach Attempts: A second unsuccessful outreach was attempted today to offer the patient with information about available complex care management services.  Follow Up Plan:  Additional outreach attempts will be made to offer the patient complex care management information and services.   Encounter Outcome:  No Answer-No voicemail  Leotis Rase [facility], [facility] Guide  Direct Dial: [phone]  Fax [phone]

## 2023-10-30 NOTE — Progress Notes (Signed)
 Complex Care Management Note  Care Guide Note 10/30/2023 Name: Lindsay [name] MRN: [id number] DOB: 15-May-1940  [name] is a 83 y.o. year old female who sees Early, [name], NP for primary care. I reached out to Dagoberto Pass by phone today to offer complex care management services.  [name] was given information about Complex Care Management services today including:   The Complex Care Management services include support from the care team which includes your Nurse Care Manager, Clinical Social Worker, or Pharmacist.  The Complex Care Management team is here to help remove barriers to the health concerns and goals most important to you. Complex Care Management services are voluntary, and the patient may decline or stop services at any time by request to their care team member.   Complex Care Management Consent Status: Patient agreed to services and verbal consent obtained.   Follow up plan:  Telephone appointment with complex care management team member scheduled for:  11/16/23 @ 11 AM  Encounter Outcome:  Patient Scheduled  Leotis Rase [facility], [facility] Guide  Direct Dial: [phone]  Fax [phone]

## 2023-10-31 ENCOUNTER — Ambulatory Visit: Payer: Self-pay | Admitting: Pulmonary Disease

## 2023-10-31 DIAGNOSIS — M62512 Muscle wasting and atrophy, not elsewhere classified, left shoulder: Secondary | ICD-10-CM | POA: Diagnosis not present

## 2023-10-31 DIAGNOSIS — M62511 Muscle wasting and atrophy, not elsewhere classified, right shoulder: Secondary | ICD-10-CM | POA: Diagnosis not present

## 2023-10-31 DIAGNOSIS — R278 Other lack of coordination: Secondary | ICD-10-CM | POA: Diagnosis not present

## 2023-10-31 NOTE — Telephone Encounter (Signed)
 Patient with hx of COPD on home oxygen  called with continued concerns of cough and chest congestion. Patient finished antibiotic today and still doses of steroids left. Patient is concerned that her cough and congestion hasn't gotten better. Patient is asking for any recommendations from provider. Reports good use of inhalers. Asking for a follow up call from office.   FYI Only or Action Required?: Action required by provider: clinical question for provider.  Patient is followed in Pulmonology for COPD, last seen on 09/27/2023 by [name], MD.  Called Nurse Triage reporting Cough and Chest Congestion.  Symptoms began a week ago.  Interventions attempted: Rescue inhaler, Maintenance inhaler, and Home oxygen  use.  Symptoms are: unchanged.  Triage Disposition: See Physician Within 24 Hours  Patient/caregiver understands and will follow disposition?: Yes  Copied from CRM #[id number]. Topic: Clinical - Red Word Triage >> Oct 31, 2023  3:23 PM Isabell A wrote: Red Word that prompted transfer to Nurse Triage: Patient still not feeling well - states shes taking prednisone  & Zpak, wants to know what else she can do. Experiencing chest congestion, cough Reason for Disposition  [1] Continuous (nonstop) coughing interferes with work or school AND [2] no improvement using cough treatment per Care Advice  Answer Assessment - Initial Assessment Questions 1. ONSET: When did the cough begin?      Started last week 2. SEVERITY: How bad is the cough today?      moderate 3. SPUTUM: Describe the color of your sputum (e.g., none, dry cough; clear, white, yellow, green)     yellow 4. HEMOPTYSIS: Are you coughing up any blood? If Yes, ask: How much? (e.g., flecks, streaks, tablespoons, etc.)     no 5. DIFFICULTY BREATHING: Are you having difficulty breathing? If Yes, ask: How bad is it? (e.g., mild, moderate, severe)      no 6. FEVER: Do you have a fever? If Yes, ask: What is your  temperature, how was it measured, and when did it start?     no 7. CARDIAC HISTORY: Do you have any history of heart disease? (e.g., heart attack, congestive heart failure)      yes 8. LUNG HISTORY: Do you have any history of lung disease?  (e.g., pulmonary embolus, asthma, emphysema)     yes 9. PE RISK FACTORS: Do you have a history of blood clots? (or: recent major surgery, recent prolonged travel, bedridden)     no 10. OTHER SYMPTOMS: Do you have any other symptoms? (e.g., runny nose, wheezing, chest pain)       Chest congestion 12. TRAVEL: Have you traveled out of the country in the last month? (e.g., travel history, exposures)       no  Protocols used: Cough - Acute Productive-A-AH

## 2023-11-02 NOTE — Telephone Encounter (Signed)
 I called the pt and there was no answer and her VM is not set up yet.

## 2023-11-02 NOTE — Telephone Encounter (Signed)
 Needs in person evaluation at [facility] or ED given failure to improve. Thanks.

## 2023-11-05 NOTE — Telephone Encounter (Signed)
 ATC x2.  No answer.  No VM set up.  We do have openings today with Landry Ferrari NP today if she calls back.  Mychart message sent.

## 2023-11-06 DIAGNOSIS — R278 Other lack of coordination: Secondary | ICD-10-CM | POA: Diagnosis not present

## 2023-11-06 DIAGNOSIS — M62512 Muscle wasting and atrophy, not elsewhere classified, left shoulder: Secondary | ICD-10-CM | POA: Diagnosis not present

## 2023-11-06 DIAGNOSIS — R296 Repeated falls: Secondary | ICD-10-CM | POA: Diagnosis not present

## 2023-11-06 DIAGNOSIS — M62511 Muscle wasting and atrophy, not elsewhere classified, right shoulder: Secondary | ICD-10-CM | POA: Diagnosis not present

## 2023-11-06 NOTE — Telephone Encounter (Signed)
 Patient responded by Lindsay [name] she is feeling better.  Nothing further needed.

## 2023-11-09 ENCOUNTER — Other Ambulatory Visit: Payer: Medicare Other

## 2023-11-14 DIAGNOSIS — M62511 Muscle wasting and atrophy, not elsewhere classified, right shoulder: Secondary | ICD-10-CM | POA: Diagnosis not present

## 2023-11-14 DIAGNOSIS — R296 Repeated falls: Secondary | ICD-10-CM | POA: Diagnosis not present

## 2023-11-14 DIAGNOSIS — M62512 Muscle wasting and atrophy, not elsewhere classified, left shoulder: Secondary | ICD-10-CM | POA: Diagnosis not present

## 2023-11-14 DIAGNOSIS — R278 Other lack of coordination: Secondary | ICD-10-CM | POA: Diagnosis not present

## 2023-11-16 ENCOUNTER — Telehealth: Payer: Self-pay

## 2023-11-16 DIAGNOSIS — B351 Tinea unguium: Secondary | ICD-10-CM | POA: Diagnosis not present

## 2023-11-16 DIAGNOSIS — L600 Ingrowing nail: Secondary | ICD-10-CM | POA: Diagnosis not present

## 2023-11-16 DIAGNOSIS — M79671 Pain in right foot: Secondary | ICD-10-CM | POA: Diagnosis not present

## 2023-11-16 DIAGNOSIS — I739 Peripheral vascular disease, unspecified: Secondary | ICD-10-CM | POA: Diagnosis not present

## 2023-11-16 NOTE — Patient Instructions (Signed)
 Lindsay [name] - I am sorry I was unable to reach you today for our scheduled appointment. I work with Early, [name], NP and am calling to support your healthcare needs. Please contact me at [phone] at your earliest convenience. I look forward to speaking with you soon.   Thank you,   Clayborne Ly RN BSN CCM [place]  [facility], [facility] Nurse Care Coordinator  Direct Dial: [phone] Website: [email or web address]

## 2023-11-19 ENCOUNTER — Ambulatory Visit: Admitting: Physician Assistant

## 2023-11-21 DIAGNOSIS — R278 Other lack of coordination: Secondary | ICD-10-CM | POA: Diagnosis not present

## 2023-11-21 DIAGNOSIS — M62512 Muscle wasting and atrophy, not elsewhere classified, left shoulder: Secondary | ICD-10-CM | POA: Diagnosis not present

## 2023-11-21 DIAGNOSIS — R296 Repeated falls: Secondary | ICD-10-CM | POA: Diagnosis not present

## 2023-11-21 DIAGNOSIS — M62511 Muscle wasting and atrophy, not elsewhere classified, right shoulder: Secondary | ICD-10-CM | POA: Diagnosis not present

## 2023-11-26 ENCOUNTER — Encounter: Payer: Self-pay | Admitting: Radiology

## 2023-11-28 DIAGNOSIS — M62511 Muscle wasting and atrophy, not elsewhere classified, right shoulder: Secondary | ICD-10-CM | POA: Diagnosis not present

## 2023-11-28 DIAGNOSIS — R296 Repeated falls: Secondary | ICD-10-CM | POA: Diagnosis not present

## 2023-11-28 DIAGNOSIS — M62512 Muscle wasting and atrophy, not elsewhere classified, left shoulder: Secondary | ICD-10-CM | POA: Diagnosis not present

## 2023-11-28 DIAGNOSIS — R278 Other lack of coordination: Secondary | ICD-10-CM | POA: Diagnosis not present

## 2023-12-05 ENCOUNTER — Other Ambulatory Visit: Payer: Self-pay

## 2023-12-05 DIAGNOSIS — R278 Other lack of coordination: Secondary | ICD-10-CM | POA: Diagnosis not present

## 2023-12-05 DIAGNOSIS — M62511 Muscle wasting and atrophy, not elsewhere classified, right shoulder: Secondary | ICD-10-CM | POA: Diagnosis not present

## 2023-12-05 DIAGNOSIS — M62512 Muscle wasting and atrophy, not elsewhere classified, left shoulder: Secondary | ICD-10-CM | POA: Diagnosis not present

## 2023-12-05 DIAGNOSIS — R296 Repeated falls: Secondary | ICD-10-CM | POA: Diagnosis not present

## 2023-12-05 DIAGNOSIS — J4489 Other specified chronic obstructive pulmonary disease: Secondary | ICD-10-CM

## 2023-12-05 DIAGNOSIS — E1169 Type 2 diabetes mellitus with other specified complication: Secondary | ICD-10-CM

## 2023-12-05 DIAGNOSIS — I509 Heart failure, unspecified: Secondary | ICD-10-CM

## 2023-12-05 NOTE — Patient Outreach (Addendum)
 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 I obtained verbal consent from Patient.  Visit completed with Patient on the phone.  Background:   Past Medical History:  Diagnosis Date   Acute diastolic CHF (congestive heart failure) (HCC) 08/03/2020   Acute non-recurrent pansinusitis 10/21/2020   Acute on chronic diastolic CHF (congestive heart failure) (HCC) 08/03/2020   Acute on chronic respiratory failure with hypoxia (HCC) 08/02/2020   Acute on chronic respiratory failure with hypoxia and hypercapnia (HCC) 11/10/2022   Allergy    Anxiety    Asthma    Asthma exacerbation 08/02/2020   Atrial fibrillation (HCC)    Cataract    COPD (chronic obstructive pulmonary disease) (HCC)    Depression    Dysphagia 08/02/2020   Dyspnea 05/09/2021   Elevated troponin 09/14/2020   GERD (gastroesophageal reflux disease)    High cholesterol    Hospital discharge follow-up 11/28/2022   HTN (hypertension)    Hyperkalemia 09/14/2020   Hyponatremia 08/02/2020   Low grade squamous intraepithelial lesion on cytologic smear of cervix (LGSIL) 12/02/2019   Moderate persistent asthma without complication 12/02/2019   Non-healing wound of right lower extremity 08/01/2022   Osteoporosis    Oxygen  deficiency    Papule of skin 11/29/2022   Respiratory failure with hypoxia (HCC)    Sinus congestion 11/29/2022   Sore throat 11/16/2020    Assessment: Patient Reported Symptoms:  Cognitive Cognitive Status: Normal speech and language skills, Alert and oriented to person, place, and time Cognitive/Intellectual Conditions Management [RPT]: None reported or documented in medical history or problem list   Health  Maintenance Behaviors: Annual physical exam Health Facilitated by: Rest  Neurological Neurological Review of Symptoms: Hearing changes, Vision changes Neurological Self-Management Outcome: 3 (uncertain) Neurological Comment: Patient would like to establish with Audiology and Ophthalmology for evaluation of changes in hearing and vision  HEENT HEENT Symptoms Reported: Change or loss of hearing, Mouth or teeth pain (2 teeth that are broken off) HEENT Management Strategies: Routine screening HEENT Self-Management Outcome: 3 (uncertain)    Cardiovascular Cardiovascular Symptoms Reported: Swelling in legs or feet Does patient have uncontrolled Hypertension?: No Cardiovascular Management Strategies: Adequate rest, Medication therapy, Routine screening Cardiovascular Self-Management Outcome: 4 (good)  Respiratory Respiratory Symptoms Reported: Shortness of breath, Productive cough Respiratory Management Strategies: Oxygen  therapy, Medication therapy, Routine screening Respiratory Self-Management Outcome: 4 (good)  Endocrine Endocrine Symptoms Reported: No symptoms reported Is patient diabetic?: Yes Is patient checking blood sugars at home?: No Endocrine Self-Management Outcome: 3 (uncertain)  Gastrointestinal Gastrointestinal Symptoms Reported: Other Other Gastrointestinal Symptoms: dysphagia; patient completed a recent  barium swallow, she has a follow up with GI Gastrointestinal Self-Management Outcome: 3 (uncertain)    Genitourinary Genitourinary Symptoms Reported: Frequency, Incontinence, Other Other Genitourinary Symptoms: nocturia Genitourinary Management Strategies: Fluid modification Genitourinary Self-Management Outcome: 3 (uncertain)  Integumentary Integumentary Symptoms Reported: Not assessed    Musculoskeletal Musculoskelatal Symptoms Reviewed: Difficulty walking, Limited mobility, Unsteady gait Musculoskeletal Management Strategies: Medical device Falls in the past year?:  No Number of falls in past year: 1 or less Was there an injury with Fall?: No Fall Risk Category Calculator: 0 Patient Fall Risk Level: Low Fall Risk Patient at Risk for Falls Due to: Impaired mobility, Impaired balance/gait Fall risk Follow up: Falls evaluation  completed, Education provided  Psychosocial Psychosocial Symptoms Reported: Depression - if selected complete PHQ 2-9, Difficulty concentrating, Sadness - if selected complete PHQ 2-9, Alteration in sleep habits Behavioral Management Strategies: Medication therapy Behavioral Health Self-Management Outcome: 3 (uncertain)   Quality of Family Relationships: helpful, involved, supportive Do you feel physically threatened by others?: No    12/05/2023    PHQ2-9 Depression Screening   Janiel Derhammer interest or pleasure in doing things More than half the days  Feeling down, depressed, or hopeless More than half the days  PHQ-2 - Total Score 4  Trouble falling or staying asleep, or sleeping too much Nearly every day  Feeling tired or having Zaydee Aina energy More than half the days  Poor appetite or overeating  Not at all  Feeling bad about yourself - or that you are a failure or have let yourself or your family down Not at all  Trouble concentrating on things, such as reading the newspaper or watching television Several days  Moving or speaking so slowly that other people could have noticed.  Or the opposite - being so fidgety or restless that you have been moving around a lot more than usual Not at all  Thoughts that you would be better off dead, or hurting yourself in some way Not at all  PHQ2-9 Total Score 10  If you checked off any problems, how difficult have these problems made it for you to do your work, take care of things at home, or get along with other people Somewhat difficult  Depression Interventions/Treatment Medication, Currently on Treatment (patient scheduled with Jasmine Lewis LCSW for counseling and support)    There were no  vitals filed for this visit. Pain Scale: Not given for pain  Medications Reviewed Today     Reviewed by Morgan Clayborne CROME, RN (Registered Nurse) on 12/05/23 at 1048  Med List Status: <None>   Medication Order Taking? Sig Documenting Provider Last Dose Status Informant  acetaminophen  (TYLENOL ) 500 MG tablet [id number]  Take 2 tablets (1,000 mg total) by mouth in the morning, at noon, and at bedtime. [name], NP  Active   alendronate  (FOSAMAX ) 70 MG tablet [id number]  Take 1 tablet (70 mg total) by mouth once a week. Take with a full glass of water on an empty stomach. Early, [name], NP  Active   atorvastatin  (LIPITOR) 40 MG tablet [id number]  Take 1 tablet (40 mg total) by mouth daily. [name], NP  Active   azithromycin  (ZITHROMAX ) 250 MG tablet [id number]  Take as directed [name], MD  Active   Calcium -Magnesium -Vitamin D  (CALCIUM  1200+D3 PO) [id number]  Take 1 tablet by mouth in the morning. [provider]  Active Self  cetirizine  (ZYRTEC ) 10 MG tablet [id number]  Take 1 tablet (10 mg total) by mouth daily. Early, [name], NP  Active   Cyanocobalamin  (B-12) 1000 MCG SUBL [id number]  Place 1 tablet under the tongue daily at 6 (six) AM. Early, [name], NP  Active   Elastic Bandages & Supports (MEDICAL COMPRESSION STOCKINGS) MISC [id number]  Two pair knee high 30-64mmHg compression stockings. Sized XL. As covered by insurance for ICD-10: I50.33, R60.0. Early, [name], NP  Active Self           Med Note (GANN, RILEY C   Mon Jul 02, 2023  1:52 PM) Wants to check on it.   fluticasone  (FLONASE) 50 MCG/ACT nasal spray [id number]  Place 1 spray into both nostrils 2 (two) times daily  as needed for allergies or rhinitis. [name], MD  Active   Fluticasone -Umeclidin-Vilant (TRELEGY ELLIPTA ) 100-62.5-25 MCG/ACT AEPB [id number]  Inhale 1 puff into the lungs daily. [name], MD  Active   Fluticasone -Umeclidin-Vilant (TRELEGY ELLIPTA ) 100-62.5-25 MCG/ACT AEPB  [id number]  Inhale 1 puff into the lungs daily. [name], MD  Active   ipratropium-albuterol  (DUONEB) 0.5-2.5 (3) MG/3ML SOLN [id number]  Take 3 mLs by nebulization every 6 (six) hours as needed (wheezing). [name], MD  Active   LORazepam  (ATIVAN ) 1 MG tablet [id number]  Take 1 tablet (1 mg total) by mouth 2 (two) times daily. Early, [name], NP  Active   Menthol (COUGH DROPS) 5.8 MG LOZG [id number]  Use as directed 1 lozenge (5.8 mg total) in the mouth or throat as needed (for dry, sore, or scratchy throat). Early, [name], NP  Active Self  metoprolol  tartrate (LOPRESSOR ) 25 MG tablet [id number]  Take 1 tablet (25 mg total) by mouth 2 (two) times daily. [name], NP  Active   mirtazapine  (REMERON ) 45 MG tablet [id number]  Take 1 tablet (45 mg total) by mouth at bedtime as needed (sleep). Early, [name], NP  Active   montelukast (SINGULAIR) 10 MG tablet [id number]  Take 1 tablet (10 mg total) by mouth at bedtime. [name], MD  Active   OXYGEN  [id number]  Inhale 2 L/min into the lungs continuous. [provider]  Active Self  pantoprazole  (PROTONIX ) 40 MG tablet [id number]  09-19-23 Increase to pantoprazole  40mg  bid for 1 month, then reduce to pantoprazole  40mg  once daily [name], [name], [name]  Active   rivaroxaban (XARELTO) 20 MG TABS tablet [id number]  Take 20 mg by mouth daily with supper. [provider]  Active   sertraline  (ZOLOFT ) 100 MG tablet [id number]  Take 1.5 tablets (150 mg total) by mouth at bedtime. Early, [name], NP  Active   Spacer/Aero-Holding Chambers (AEROCHAMBER MV) inhaler [id number]  Use as instructed [name], MD  Active Self  spironolactone  (ALDACTONE ) 25 MG tablet [id number]  TAKE A HALF TABLET DAILY AS NEEDED FOR EDEMA OR FLUID RETENTION Walker, [name], NP  Active   torsemide  (DEMADEX ) 20 MG tablet [id number]  TAKE 2 TABLETS DAILY Walker, [name], NP  Active   Vitamin D , Ergocalciferol , (DRISDOL ) 1.25 MG (50000 UNIT)  CAPS capsule [id number]  Take 1 capsule (50,000 Units total) by mouth every 7 (seven) days. Early, [name], NP  Active             Recommendation:   PCP Follow-up  Specialty provider follow-up  12/06/2023 Status: Sch   Time: 9:30 AM Length: 30  Visit Type: PATIENT OUTREACH 30 [3016] Copay: $0.00  Provider: PFM-PHARMACIST Department: [facility]    01/01/2024 Status: Sch   Time: 12:00 PM Length: 30  Visit Type: DG DEXA [150200] Copay: $0.00  Provider: ITA-IZKJ Department: [facility]    01/22/2024 Status: Sch   Time: 10:20 AM Length: 20  Visit Type: FOLLOW UP 20 [336] Copay: $0.00  Provider: Craig Alan SAUNDERS, [name] Department: [facility]   02/05/2024 Status: Sch   Time: 11:00 AM Length: 60  Visit Type: PFT - PULM [8023] Copay: $0.00  Provider: [facility] Department: [facility]    02/07/2024 Status: Sch   Time: 11:30 AM Length: 30  Visit Type: OFFICE VISIT [8002] Copay: $0.00  Provider: Oris [name], NP Department: [facility]    Follow Up Plan:  Telephone follow up appointment with social worker Rolin Kerns LCSW date/time: Wednesday, December 10 at 3:00 PM Telephone follow up appointment with nurse care manager Clayborne Ly RN date/time:  Wednesday, December 3 at 2:30 PM   Clayborne Ly RN BSN CCM [place]  [facility], [facility] Nurse Care Coordinator  Direct Dial: [phone] Website: [email or web address]

## 2023-12-06 ENCOUNTER — Telehealth: Payer: Self-pay | Admitting: Nurse Practitioner

## 2023-12-06 ENCOUNTER — Other Ambulatory Visit: Payer: Self-pay

## 2023-12-06 DIAGNOSIS — Z79899 Other long term (current) drug therapy: Secondary | ICD-10-CM

## 2023-12-06 NOTE — Progress Notes (Signed)
 12/06/2023 Name: Lindsay [name] MRN: [id number] DOB: 1940/03/12  Chief Complaint  Patient presents with   Medication Management    Lindsay [name] is a 83 y.o. year old female who presented for a telephone visit.   They were referred to the pharmacist by their PCP for assistance in managing complex medication management.    Subjective:  Care Team: Primary Care Provider: Early, [name], NP ; Medication Access/Adherence  Current Pharmacy:  [facility] - [facility] [address] [place] [address] Phone: [phone] Fax: [phone]  [facility] - [place], [place] - [address] [place] [address] Phone: [phone] Fax: [phone]   Patient reports affordability concerns with their medications: No Patient reports access/transportation concerns to their pharmacy: No  Patient reports adherence concerns with their medications:  No   Medication Management: -Reporting she is still having sleep concerns with her mood even after being on zoloft  150mg  -Interested in restarting remeron  -Wants a flu shot and to r/s missed appt with PCP -happy with [facility] services  Objective:  Lab Results  Component Value Date   HGBA1C 5.9 (H) 03/22/2023    Lab Results  Component Value Date   CREATININE 1.20 (H) 07/02/2023   BUN 19 07/02/2023   NA 142 07/02/2023   K 4.0 07/02/2023   CL 98 07/02/2023   CO2 27 07/02/2023    Lab Results  Component Value Date   CHOL 155 07/02/2023   HDL 58 07/02/2023   LDLCALC 83 07/02/2023   LDLDIRECT 108 (H) 06/30/2022   TRIG 75 07/02/2023   CHOLHDL 2.7 07/02/2023    Medications Reviewed Today     Reviewed by Lionell [name], RPH (Pharmacist) on 12/06/23 at 0947  Med List Status: <None>   Medication Order Taking? Sig Documenting Provider Last Dose Status Informant  acetaminophen  (TYLENOL ) 500 MG tablet [id number]  Take 2 tablets (1,000 mg total) by mouth in the morning, at  noon, and at bedtime. [name], NP  Active   alendronate  (FOSAMAX ) 70 MG tablet [id number]  Take 1 tablet (70 mg total) by mouth once a week. Take with a full glass of water on an empty stomach. Early, [name], NP  Active   atorvastatin  (LIPITOR) 40 MG tablet [id number]  Take 1 tablet (40 mg total) by mouth daily. [name], NP  Active   azithromycin  (ZITHROMAX ) 250 MG tablet [id number]  Take as directed [name], MD  Active   Calcium -Magnesium -Vitamin D  (CALCIUM  1200+D3 PO) [id number]  Take 1 tablet by mouth in the morning. [provider]  Active Self  cetirizine  (ZYRTEC ) 10 MG tablet [id number]  Take 1 tablet (10 mg total) by mouth daily. Early, [name], NP  Active   Cyanocobalamin  (B-12) 1000 MCG SUBL [id number]  Place 1 tablet under the tongue daily at 6 (six) AM. Early, [name], NP  Active   Elastic Bandages & Supports (MEDICAL COMPRESSION STOCKINGS) MISC [id number]  Two pair knee high 30-62mmHg compression stockings. Sized XL. As covered by insurance for ICD-10: I50.33, R60.0. Early, [name], NP  Active Self           Med Note (GANN, RILEY C   Mon Jul 02, 2023  1:52 PM) Wants to check on it.   fluticasone  (FLONASE) 50 MCG/ACT nasal spray [id number]  Place 1 spray into both nostrils 2 (two) times daily as needed for allergies or rhinitis. [name], MD  Active   Fluticasone -Umeclidin-Vilant (  TRELEGY ELLIPTA ) 100-62.5-25 MCG/ACT AEPB [id number]  Inhale 1 puff into the lungs daily. [name], MD  Active   Fluticasone -Umeclidin-Vilant (TRELEGY ELLIPTA ) 100-62.5-25 MCG/ACT AEPB [id number]  Inhale 1 puff into the lungs daily. [name], MD  Active   ipratropium-albuterol  (DUONEB) 0.5-2.5 (3) MG/3ML SOLN [id number]  Take 3 mLs by nebulization every 6 (six) hours as needed (wheezing). [name], MD  Active   LORazepam  (ATIVAN ) 1 MG tablet [id number]  Take 1 tablet (1 mg total) by mouth 2 (two) times daily. Early, [name], NP  Active   Menthol (COUGH  DROPS) 5.8 MG LOZG [id number]  Use as directed 1 lozenge (5.8 mg total) in the mouth or throat as needed (for dry, sore, or scratchy throat). Early, [name], NP  Active Self  metoprolol  tartrate (LOPRESSOR ) 25 MG tablet [id number]  Take 1 tablet (25 mg total) by mouth 2 (two) times daily. [name], NP  Active   mirtazapine  (REMERON ) 45 MG tablet [id number]  Take 1 tablet (45 mg total) by mouth at bedtime as needed (sleep). Early, [name], NP  Active   montelukast (SINGULAIR) 10 MG tablet [id number]  Take 1 tablet (10 mg total) by mouth at bedtime. [name], MD  Active   OXYGEN  [id number]  Inhale 2 L/min into the lungs continuous. [provider]  Active Self  pantoprazole  (PROTONIX ) 40 MG tablet [id number]  09-19-23 Increase to pantoprazole  40mg  bid for 1 month, then reduce to pantoprazole  40mg  once daily [name], [name], [name]  Active   rivaroxaban (XARELTO) 20 MG TABS tablet [id number]  Take 20 mg by mouth daily with supper. [provider]  Active   sertraline  (ZOLOFT ) 100 MG tablet [id number]  Take 1.5 tablets (150 mg total) by mouth at bedtime. Early, [name], NP  Active   Spacer/Aero-Holding Chambers (AEROCHAMBER MV) inhaler [id number]  Use as instructed [name], MD  Active Self  spironolactone  (ALDACTONE ) 25 MG tablet [id number]  TAKE A HALF TABLET DAILY AS NEEDED FOR EDEMA OR FLUID RETENTION [name], [name], NP  Active   torsemide  (DEMADEX ) 20 MG tablet [id number]  TAKE 2 TABLETS DAILY [name], [name], NP  Active   Vitamin D , Ergocalciferol , (DRISDOL ) 1.25 MG (50000 UNIT) CAPS capsule [id number]  Take 1 capsule (50,000 Units total) by mouth every 7 (seven) days. Early, [name], NP  Active               Assessment/Plan:   Medication Management: -Continue sertraline  150mg  nightly, you may restart remeron  as previously informed by PCP if still having sleep issues -Call sooner than scheduled f/u if medication or pharmacy concerns arise -Will have  front office reach out to r/s missed PCP f/u in Sept and for a flu shot    Follow Up Plan: 1 MONTH  Jon VEAR [place], PharmD Clinical Pharmacist [phone]

## 2023-12-06 NOTE — Patient Instructions (Signed)
 Visit Information  Thank you for taking time to visit with me today. Please don't hesitate to contact me if I can be of assistance to you before our next scheduled appointment.  Our next appointment is by telephone on Wednesday, December 3 at 2:30 PM Please call the care guide team at [phone] if you need to cancel or reschedule your appointment.   Following is a copy of your care plan:   Goals Addressed             This Visit's Progress    COMPLETED: Obtain Supportive Resources-Management of Mental Health Symptoms       See new VBCI goal      COMPLETED: RN Care Coordination Activities - further follow up needed       See new VBCI goal      VBCI RN Care Plan related to high risk for Aspiration secondary to dysphagia with severe esophageal dysmotility       Problems:  Chronic Disease Management support and education needs related to high risk for Aspiration secondary to dysphagia with severe esophageal dysmotility  Goal: Over the next 90 days the Patient will continue to work with RN Care Manager and/or Social Worker to address care management and care coordination needs related to high risk for Aspiration secondary to dysphagia with severe esophageal dysmotility as evidenced by adherence to care management team scheduled appointments      Interventions:   Evaluation of current treatment plan related to high risk for Aspiration secondary to dysphagia with severe esophageal dysmotility, self-management and patient's adherence to plan as established by provider Reviewed and discussed patient's recent barium swallow to evaluate her dysphagia Determined patient is experiencing dysphagia when swallowing foods, liquids and medications Educated patient re: her results indicating severe esophageal dysmotility, barium tablet with slight sticking at gastroesophageal junction Counseled patient on the importance of keeping scheduled follow up visits to help determined needed interventions to  help treat her condition  Educated patient re: risk for aspiration, educated re: ways to help avoid aspiration and when to seek medical attention if symptoms occur Assisted patient with rescheduling her f/u with Alan Coombs PA with [facility]  Discussed plans with patient for ongoing care management follow up and provided patient with direct contact information for care management team   Patient Self-Care Activities:  Attend all scheduled provider appointments Call pharmacy for medication refills 3-7 days in advance of running out of medications Call provider office for new concerns or questions  Take medications as prescribed   Work with the nurse care manager to address care coordination needs and will continue to work with the clinical team to address health care and disease management related needs  Recommendation:   PCP Follow-up   Specialty provider follow-up  12/06/2023 Status: Sch    Time: 9:30 AM Length: 30  Visit Type: PATIENT OUTREACH 30 [3016] Copay: $0.00  Provider: PFM-PHARMACIST Department: [facility]     01/01/2024 Status: Sch    Time: 12:00 PM Length: 30  Visit Type: DG DEXA [150200] Copay: $0.00  Provider: ITA-IZKJ Department: [facility]    01/22/2024 Status: Sch   Time: 10:20 AM Length: 20  Visit Type: FOLLOW UP 20 [336] Copay: $0.00  Provider: Coombs Alan SAUNDERS, [name] Department: [facility]   02/05/2024 Status: Sch   Time: 11:00 AM Length: 60  Visit Type: PFT - PULM [8023] Copay: $0.00  Provider: [facility] Department: [facility]    02/07/2024 Status: Sch  Time: 11:30 AM Length: 30  Visit Type: OFFICE VISIT [8002] Copay: $0.00  Provider: Oris [name], NP Department: [facility]     Follow Up Plan:   Telephone follow up appointment with social worker Rolin Kerns LCSW date/time: Wednesday, December 10 at 3:00 PM Telephone follow up appointment with nurse care manager Clayborne Ly RN date/time:   Wednesday, December 3 at 2:30 PM       VBCI RN Care Plan related to Urinary frequency with Nocturia       Problems:  Chronic Disease Management support and education needs related to Alternation in Sleep Pattern secondary to Impaired Urinary frequency with Nocturia   Goal: Over the next 90 days the Patient will continue to work with RN Care Manager and/or Social Worker to address care management and care coordination needs related to Alteration in Sleep Pattern secondary Urinary frequency with Nocturia as evidenced by adherence to care management team scheduled appointments      Interventions:   Evaluation of current treatment plan related to Alteration in Sleep Pattern secondary to Urinary frequency with Nocturia, Limited social support and Depression self-management and patient's adherence to plan as established by provider Discussed with patient she is experiencing brief intermittent periods of sleep during the nighttime due to having nocturia  Reviewed and discussed patient's medication regimen with her prescribed diuretics, determined patient has been taking Spironolactone  at bedtime Educated patient re: the best time to take this medication to include in the morning and or early afternoon to help prevent nocturia Determined patient does not have a diagnosis of OSA, she does not recall ever being evaluated for this condition  Educated patient re: OSA and nocturia, encouraged patient to discuss her concerns with her PCP for further evaluation, in basket message sent to Camie Oris, NP Assisted patient with scheduling a PCP follow up with Camie Oris, NP  Discussed plans with patient for ongoing care management follow up and provided patient with direct contact information for care management team  Patient Self-Care Activities:  Attend all scheduled provider appointments Call pharmacy for medication refills 3-7 days in advance of running out of medications Call provider office for new  concerns or questions  Take medications as prescribed   Work with the social worker to address care coordination needs and will continue to work with the clinical team to address health care and disease management related needs  Recommendation:   PCP Follow-up   Specialty provider follow-up  12/06/2023 Status: Sch    Time: 9:30 AM Length: 30  Visit Type: PATIENT OUTREACH 30 [3016] Copay: $0.00  Provider: PFM-PHARMACIST Department: [facility]     01/01/2024 Status: Sch    Time: 12:00 PM Length: 30  Visit Type: DG DEXA [150200] Copay: $0.00  Provider: ITA-IZKJ Department: [facility]     01/22/2024 Status: Sch   Time: 10:20 AM Length: 20  Visit Type: FOLLOW UP 20 [336] Copay: $0.00  Provider: Craig Alan SAUNDERS, [name] Department: [facility]   02/05/2024 Status: Sch   Time: 11:00 AM Length: 60  Visit Type: PFT - PULM [8023] Copay: $0.00  Provider: [facility] Department: [facility]    02/07/2024 Status: Sch   Time: 11:30 AM Length: 30  Visit Type: OFFICE VISIT [8002] Copay: $0.00  Provider: Oris [name], NP Department: [facility]   Follow Up Plan:   Telephone follow up appointment with social worker Rolin Kerns LCSW date/time: Wednesday, December 10 at 3:00 PM Telephone follow up  appointment with nurse care manager Clayborne Ly RN date/time:  Wednesday, December 3 at 2:30 PM       VBCI RN Care Plan related to worsening depression       Problems:  Chronic Disease Management support and education needs related to worsening Depression   Goal: Over the next 90 days the Patient will continue to work with RN Care Manager and/or Social Worker to address care management and care coordination needs related to Depression as evidenced by adherence to care management team scheduled appointments      Interventions:   Evaluation of current treatment plan related to Depression,  self-management and patient's adherence to plan as  established by provider Discussed with patient her depression has worsened, she is having difficulty with concentration and has limited social support Reviewed and discussed patient's medication regimen for treatment of depression  Determined patient's PCP increased her Zoloft , however patient states this dose increase has not been effective Provided active listening to patient and validated her concerns related to worsening depression  Educated patient about LCSW availability, scheduled patient with Jasmine Lewis for counseling and support, in basket message sent  Assisted patient with scheduling a PCP in person visit  Sent in basket message to Camie Doing NP to advise of patient's health concerns and reports of worsening depression  Discussed plans with patient for ongoing care management follow up and provided patient with direct contact information for care management team  Patient Self-Care Activities:  Attend all scheduled provider appointments Call pharmacy for medication refills 3-7 days in advance of running out of medications Call provider office for new concerns or questions  Take medications as prescribed   Work with the social worker to address care coordination needs and will continue to work with the clinical team to address health care and disease management related needs  Recommendation:   PCP Follow-up   Specialty provider follow-up  12/06/2023 Status: Sch    Time: 9:30 AM Length: 30  Visit Type: PATIENT OUTREACH 30 [3016] Copay: $0.00  Provider: PFM-PHARMACIST Department: [facility]     01/01/2024 Status: Sch    Time: 12:00 PM Length: 30  Visit Type: DG DEXA [150200] Copay: $0.00  Provider: ITA-IZKJ Department: [facility]    01/22/2024 Status: Sch   Time: 10:20 AM Length: 20  Visit Type: FOLLOW UP 20 [336] Copay: $0.00  Provider: Craig Alan SAUNDERS, [name] Department: [facility]   02/05/2024 Status: Sch   Time: 11:00 AM Length: 60  Visit  Type: PFT - PULM [8023] Copay: $0.00  Provider: [facility] Department: [facility]    02/07/2024 Status: Sch   Time: 11:30 AM Length: 30  Visit Type: OFFICE VISIT [8002] Copay: $0.00  Provider: Doing [name], NP Department: [facility]   Follow Up Plan:   Telephone follow up appointment with social worker Rolin Kerns LCSW date/time: Wednesday, December 10 at 3:00 PM Telephone follow up appointment with nurse care manager Clayborne Ly RN date/time:  Wednesday, December 3 at 2:30 PM          Please call the Suicide and Crisis Lifeline: 988 if you are experiencing a Mental Health or Behavioral Health Crisis or need someone to talk to.  Patient verbalized understanding of Care plan and visit instructions communicated this visit  Clayborne Ly RN BSN CCM [facility]  [facility], [facility] Nurse Care Coordinator  Direct Dial: [phone] Website: [email or web address]

## 2023-12-06 NOTE — Telephone Encounter (Signed)
 Called & left message to schedule 3 month follow up with Camie & flu shot (please transfer to office to schedule work in)

## 2023-12-13 DIAGNOSIS — R296 Repeated falls: Secondary | ICD-10-CM | POA: Diagnosis not present

## 2023-12-13 DIAGNOSIS — M62512 Muscle wasting and atrophy, not elsewhere classified, left shoulder: Secondary | ICD-10-CM | POA: Diagnosis not present

## 2023-12-13 DIAGNOSIS — R278 Other lack of coordination: Secondary | ICD-10-CM | POA: Diagnosis not present

## 2023-12-13 DIAGNOSIS — M62511 Muscle wasting and atrophy, not elsewhere classified, right shoulder: Secondary | ICD-10-CM | POA: Diagnosis not present

## 2023-12-19 ENCOUNTER — Telehealth: Admitting: Physician Assistant

## 2023-12-19 ENCOUNTER — Telehealth: Payer: Self-pay

## 2023-12-19 ENCOUNTER — Ambulatory Visit: Payer: Self-pay | Admitting: Pulmonary Disease

## 2023-12-19 DIAGNOSIS — J069 Acute upper respiratory infection, unspecified: Secondary | ICD-10-CM

## 2023-12-19 DIAGNOSIS — J441 Chronic obstructive pulmonary disease with (acute) exacerbation: Secondary | ICD-10-CM

## 2023-12-19 MED ORDER — PREDNISONE 20 MG PO TABS: 40.0000 mg | ORAL_TABLET | Freq: Every day | ORAL | 0 refills | Status: DC

## 2023-12-19 MED ORDER — DOXYCYCLINE HYCLATE 100 MG PO TABS: 100.0000 mg | ORAL_TABLET | Freq: Two times a day (BID) | ORAL | 0 refills | Status: DC

## 2023-12-19 MED ORDER — BENZONATATE 100 MG PO CAPS: 100.0000 mg | ORAL_CAPSULE | Freq: Three times a day (TID) | ORAL | 0 refills | Status: DC | PRN

## 2023-12-19 NOTE — Progress Notes (Signed)
   Thank you for the details you included in the comment boxes. Those details are very helpful in determining the best course of treatment for you and help us  to provide the best care.Because of your substantial respiratory history, we recommend that you schedule a Virtual Urgent Care video visit in order for the provider to better assess what is going on.  The provider will be able to give you a more accurate diagnosis and treatment plan if we can more freely discuss your symptoms and with the addition of a virtual examination.   If you change your visit to a video visit, we will bill your insurance (similar to an office visit) and you will not be charged for this e-Visit. You will be able to stay at home and speak with the first available [facility] advanced practice provider. The link to do a video visit is in the drop down Menu tab of your Welcome screen in MyChart.

## 2023-12-19 NOTE — Progress Notes (Signed)
 Virtual Visit Consent   Lindsay [name], you are scheduled for a virtual visit with a [facility] provider today. Just as with appointments in the office, your consent must be obtained to participate. Your consent will be active for this visit and any virtual visit you may have with one of our providers in the next 365 days. If you have a MyChart account, a copy of this consent can be sent to you electronically.  As this is a virtual visit, video technology does not allow for your provider to perform a traditional examination. This may limit your provider's ability to fully assess your condition. If your provider identifies any concerns that need to be evaluated in person or the need to arrange testing (such as labs, EKG, etc.), we will make arrangements to do so. Although advances in technology are sophisticated, we cannot ensure that it will always work on either your end or our end. If the connection with a video visit is poor, the visit may have to be switched to a telephone visit. With either a video or telephone visit, we are not always able to ensure that we have a secure connection.  By engaging in this virtual visit, you consent to the provision of healthcare and authorize for your insurance to be billed (if applicable) for the services provided during this visit. Depending on your insurance coverage, you may receive a charge related to this service.  I need to obtain your verbal consent now. Are you willing to proceed with your visit today? [name] has provided verbal consent on 12/19/2023 for a virtual visit (video or telephone). Lindsay [name], [place]  Date: 12/19/2023 4:04 PM   Virtual Visit via Video Note   I, Lindsay [name], connected with  Chole Driver  ([id number], Nov 20, 1940) on 12/19/23 at  4:00 PM EST by a video-enabled telemedicine application and verified that I am speaking with the correct person using two identifiers.  Location: Patient: Virtual Visit Location Patient:  Home Provider: Virtual Visit Location Provider: Home Office   I discussed the limitations of evaluation and management by telemedicine and the availability of in person appointments. The patient expressed understanding and agreed to proceed.    History of Present Illness: Lindsay [name] is a 83 y.o. who identifies as a female who was assigned female at birth, and is being seen today for cough and congestion about 7 days ago, worsening over the past 3 days ago. 2 days ago with increased coughing spells. One episode of post-tussive emesis. OTC cough medications not helping. Chest wall tenderness secondary to coughing. Wears oxygen  24/7 2L/min. Denies any increase in Oxygen  intake. Her home oxygen  is running at 94% on room air. HR at 83 bpm.   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, bilateral legs (HCC) 09/18/2022   Hypothyroidism 09/18/2022   Insomnia 09/18/2022   Non-toxic multinodular goiter 09/18/2022   Occlusion and stenosis of bilateral carotid arteries 09/18/2022   Other vitamin B12 deficiency anemias 09/18/2022   Unspecified mononeuropathy of right lower limb 09/18/2022   Presbycusis of both ears 07/06/2022   Sebaceous cyst 05/26/2022   Bilateral hearing loss 05/11/2022  Hypertension associated with diabetes (HCC) 05/11/2021   GERD (gastroesophageal reflux disease) 05/09/2021   Hyperlipidemia associated with type 2 diabetes mellitus (HCC) 05/09/2021   Elevated fasting blood sugar 05/09/2021   Bilateral lower extremity edema 03/09/2021   Physical deconditioning 02/18/2021   Generalized anxiety disorder 10/21/2020    Urinary incontinence in female 09/28/2020   Chronic CHF (congestive heart failure) (HCC) 09/14/2020   Achalasia of esophagus 08/06/2020   Chronic respiratory failure with hypoxia (HCC) 08/02/2020   Hypokalemia 08/02/2020   Chronic anticoagulation 08/02/2020   Acquired thrombophilia 08/02/2020   Persistent atrial fibrillation (HCC) 08/02/2020   Age-related osteoporosis without current pathological fracture 01/15/2020   History of compression fracture of spine 12/02/2019   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   Morbid obesity with body mass index (BMI) of 40.0 or higher (HCC) 12/02/2019   Oxygen  dependent 12/02/2019   Recurrent major depressive disorder, in partial remission 12/02/2019   Vitamin D  deficiency 12/02/2019   Second degree atrioventricular block 03/17/2014   Chronic obstructive pulmonary disease (HCC) 11/11/2012   Mitral valve stenosis 06/13/2004    Allergies:  Allergies  Allergen Reactions   Acetaminophen -Codeine Other (See Comments)    Agitation   Naproxen-Esomeprazole Mg Diarrhea   Codeine Other (See Comments)    Hallucinations    Medications:  Current Outpatient Medications:    benzonatate  (TESSALON ) 100 MG capsule, Take 1 capsule (100 mg total) by mouth 3 (three) times daily as needed for cough., Disp: 30 capsule, Rfl: 0   doxycycline  (VIBRA -TABS) 100 MG tablet, Take 1 tablet (100 mg total) by mouth 2 (two) times daily., Disp: 14 tablet, Rfl: 0   predniSONE  (DELTASONE ) 20 MG tablet, Take 2 tablets (40 mg total) by mouth daily with breakfast., Disp: 10 tablet, Rfl: 0   acetaminophen  (TYLENOL ) 500 MG tablet, Take 2 tablets (1,000 mg total) by mouth in the morning, at noon, and at bedtime., Disp: 21 tablet, Rfl: 0   alendronate  (FOSAMAX ) 70 MG tablet, Take 1 tablet (70 mg total) by mouth once a week. Take with a full glass of water on an empty stomach., Disp: 12 tablet, Rfl: 1   atorvastatin  (LIPITOR) 40 MG tablet, Take 1 tablet (40  mg total) by mouth daily., Disp: 90 tablet, Rfl: 1   Calcium -Magnesium -Vitamin D  (CALCIUM  1200+D3 PO), Take 1 tablet by mouth in the morning., Disp: , Rfl:    cetirizine  (ZYRTEC ) 10 MG tablet, Take 1 tablet (10 mg total) by mouth daily., Disp: 90 tablet, Rfl: 1   Cyanocobalamin  (B-12) 1000 MCG SUBL, Place 1 tablet under the tongue daily at 6 (six) AM., Disp: 90 tablet, Rfl: 1   Elastic Bandages & Supports (MEDICAL COMPRESSION STOCKINGS) MISC, Two pair knee high 30-33mmHg compression stockings. Sized XL. As covered by insurance for ICD-10: I50.33, R60.0., Disp: 2 each, Rfl: 1   fluticasone  (FLONASE ) 50 MCG/ACT nasal spray, Place 1 spray into both nostrils 2 (two) times daily as needed for allergies or rhinitis., Disp: 16 g, Rfl: 11   Fluticasone -Umeclidin-Vilant (TRELEGY ELLIPTA ) 100-62.5-25 MCG/ACT AEPB, Inhale 1 puff into the lungs daily., Disp: 180 each, Rfl: 3   Fluticasone -Umeclidin-Vilant (TRELEGY ELLIPTA ) 100-62.5-25 MCG/ACT AEPB, Inhale 1 puff into the lungs daily., Disp: 3 each, Rfl: 4   ipratropium-albuterol  (DUONEB) 0.5-2.5 (3) MG/3ML SOLN, Take 3 mLs by nebulization every 6 (six) hours as needed (wheezing)., Disp: 90 mL, Rfl: 5   LORazepam  (ATIVAN ) 1 MG tablet, Take 1 tablet (1 mg total) by mouth 2 (two) times daily., Disp: 180  tablet, Rfl: 0   Menthol (COUGH DROPS) 5.8 MG LOZG, Use as directed 1 lozenge (5.8 mg total) in the mouth or throat as needed (for dry, sore, or scratchy throat)., Disp: 36 lozenge, Rfl: 11   metoprolol  tartrate (LOPRESSOR ) 25 MG tablet, Take 1 tablet (25 mg total) by mouth 2 (two) times daily., Disp: 180 tablet, Rfl: 1   montelukast  (SINGULAIR ) 10 MG tablet, Take 1 tablet (10 mg total) by mouth at bedtime., Disp: 30 tablet, Rfl: 11   OXYGEN , Inhale 2 L/min into the lungs continuous., Disp: , Rfl:    pantoprazole  (PROTONIX ) 40 MG tablet, 09-19-23 Increase to pantoprazole  40mg  bid for 1 month, then reduce to pantoprazole  40mg  once daily, Disp: 180 tablet, Rfl: 0    rivaroxaban (XARELTO) 20 MG TABS tablet, Take 20 mg by mouth daily with supper., Disp: , Rfl:    sertraline  (ZOLOFT ) 100 MG tablet, Take 1.5 tablets (150 mg total) by mouth at bedtime., Disp: 135 tablet, Rfl: 0   Spacer/Aero-Holding Chambers (AEROCHAMBER MV) inhaler, Use as instructed, Disp: 1 each, Rfl: 0   spironolactone  (ALDACTONE ) 25 MG tablet, TAKE A HALF TABLET DAILY AS NEEDED FOR EDEMA OR FLUID RETENTION, Disp: 45 tablet, Rfl: 2   torsemide  (DEMADEX ) 20 MG tablet, TAKE 2 TABLETS DAILY, Disp: 180 tablet, Rfl: 3   Vitamin D , Ergocalciferol , (DRISDOL ) 1.25 MG (50000 UNIT) CAPS capsule, Take 1 capsule (50,000 Units total) by mouth every 7 (seven) days., Disp: 12 capsule, Rfl: 1  Observations/Objective: Patient is well-developed, well-nourished in no acute distress.  Resting comfortably  at home.  Head is normocephalic, atraumatic.  No labored breathing.  Speech is clear and coherent with logical content.  Patient is alert and oriented at baseline.   Assessment and Plan: 1. COPD exacerbation (HCC) (Primary) - benzonatate  (TESSALON ) 100 MG capsule; Take 1 capsule (100 mg total) by mouth 3 (three) times daily as needed for cough.  Dispense: 30 capsule; Refill: 0 - predniSONE  (DELTASONE ) 20 MG tablet; Take 2 tablets (40 mg total) by mouth daily with breakfast.  Dispense: 10 tablet; Refill: 0 - doxycycline  (VIBRA -TABS) 100 MG tablet; Take 1 tablet (100 mg total) by mouth 2 (two) times daily.  Dispense: 14 tablet; Refill: 0  Rx Doxycycline  and prednisone .  Increase fluids.  Rest.  Saline nasal spray.  Probiotic.  Mucinex  as directed.  Humidifier in bedroom. Tessalon  per orders. Continue home O2 monitoring -- in-person eval for any readings < 90%. Strict ER precautions reviewed.     Follow Up Instructions: I discussed the assessment and treatment plan with the patient. The patient was provided an opportunity to ask questions and all were answered. The patient agreed with the plan and  demonstrated an understanding of the instructions.  A copy of instructions were sent to the patient via MyChart unless otherwise noted below.   The patient was advised to call back or seek an in-person evaluation if the symptoms worsen or if the condition fails to improve as anticipated.    Lindsay Velma Lunger, [name]

## 2023-12-19 NOTE — Telephone Encounter (Signed)
 Cough- x 1 week 2 nights ago, coughed for 10 minutes straight, coughed so hard she threw up, upper part of chest hurts, doesn't feel like she can get a good breath Greenish color- little  Shortness of breath about the same Yesterday morning- pain to move arm or chest at all, pain on that left shoulder, into shoulder sometimes Comes and goes- no pain currently.  Wheezing, nebulizer 2 times a day-  last 2 days States breaths between words is normal for her Wear 2L-  Reason for Disposition  [1] MODERATE difficulty breathing (e.g., speaks in phrases, SOB even at rest, pulse 100-120) AND [2] NEW-onset or WORSE than normal  Answer Assessment - Initial Assessment Questions Patient states that she has had this cough for about a week. Two nights ago she coughed for about 10 minutes straight, struggled to catch her breath and threw up in her lap. She states she woke up the next day and had chest pain in the upper left chest hurt to move her arm or chest. She states she has been able to cough up some green mucus but not always. She states that her shortness of breath is about the same as normal but then said feels like she can't get a good breath. She also pauses every few words to take a breath, she states this is normal for her. She also admitted that she has been doing her nebulizer 2x a day for the last 2 days. She states she is not having the chest pain currently and it comes and goes. She states she does have wheezing. She wears oxygen  2L [place], oxygen  saturation currently is 91%. RN advised due to the way she is breathing, oxygen  lower on oxygen , this cough, chest pain recently, recommendation would be to go to the ER. Rn offered to call 911. Pt states she will call her daughter and have her take her now.        1. RESPIRATORY STATUS: Describe your breathing? (e.g., wheezing, shortness of breath, unable to speak, severe coughing)      Shortness of breath, wheezing, severe coughing 2. ONSET: When  did this breathing problem begin?      About a week ago, worse the last 2 days.  3. PATTERN Does the difficult breathing come and go, or has it been constant since it started?      More constant 4. SEVERITY: How bad is your breathing? (e.g., mild, moderate, severe)      Mod to severe 5. RECURRENT SYMPTOM: Have you had difficulty breathing before? If Yes, ask: When was the last time? and What happened that time?      yes 6. CARDIAC HISTORY: Do you have any history of heart disease? (e.g., heart attack, angina, bypass surgery, angioplasty)      CHF 7. LUNG HISTORY: Do you have any history of lung disease?  (e.g., pulmonary embolus, asthma, emphysema)     COPD 8. CAUSE: What do you think is causing the breathing problem?      unknown 9. OTHER SYMPTOMS: Do you have any other symptoms? (e.g., chest pain, cough, dizziness, fever, runny nose)     Cough, upper left chest pain intermittently 10. O2 SATURATION MONITOR:  Do you use an oxygen  saturation monitor (pulse oximeter) at home? If Yes, ask: What is your reading (oxygen  level) today? What is your usual oxygen  saturation reading? (e.g., 95%)       91% on 2L  Protocols used: Breathing Difficulty-A-AH

## 2023-12-19 NOTE — Telephone Encounter (Signed)
 Second attempt to reach patient, left message to call back [phone].   Copied from CRM #[id number]. Topic: Clinical - Red Word Triage >> Dec 19, 2023  9:28 AM Lindsay [name] wrote: Red Word that prompted transfer to Nurse Triage: pt says she had [name] really bad coughing spell, states she coughed unitl she threw up - states she is now feeling soreness on left ribs and left side of upper chest

## 2023-12-19 NOTE — Patient Instructions (Signed)
 Lindsay [name], thank you for joining [name], [name] for today's virtual visit.  While this provider is not your primary care provider (PCP), if your PCP is located in our provider database this encounter information will be shared with them immediately following your visit.   A [facility] MyChart account gives you access to today's visit and all your visits, tests, and labs performed at [facility]  click here if you don't have a [facility] MyChart account or go to [email or web address]  Consent: (Patient) Lindsay [name] provided verbal consent for this virtual visit at the beginning of the encounter.  Current Medications:  Current Outpatient Medications:    acetaminophen  (TYLENOL ) 500 MG tablet, Take 2 tablets (1,000 mg total) by mouth in the morning, at noon, and at bedtime., Disp: 21 tablet, Rfl: 0   alendronate  (FOSAMAX ) 70 MG tablet, Take 1 tablet (70 mg total) by mouth once a week. Take with a full glass of water on an empty stomach., Disp: 12 tablet, Rfl: 1   atorvastatin  (LIPITOR) 40 MG tablet, Take 1 tablet (40 mg total) by mouth daily., Disp: 90 tablet, Rfl: 1   azithromycin  (ZITHROMAX ) 250 MG tablet, Take as directed, Disp: 6 tablet, Rfl: 0   Calcium -Magnesium -Vitamin D  (CALCIUM  1200+D3 PO), Take 1 tablet by mouth in the morning., Disp: , Rfl:    cetirizine  (ZYRTEC ) 10 MG tablet, Take 1 tablet (10 mg total) by mouth daily., Disp: 90 tablet, Rfl: 1   Cyanocobalamin  (B-12) 1000 MCG SUBL, Place 1 tablet under the tongue daily at 6 (six) AM., Disp: 90 tablet, Rfl: 1   Elastic Bandages & Supports (MEDICAL COMPRESSION STOCKINGS) MISC, Two pair knee high 30-43mmHg compression stockings. Sized XL. As covered by insurance for ICD-10: I50.33, R60.0., Disp: 2 each, Rfl: 1   fluticasone  (FLONASE ) 50 MCG/ACT nasal spray, Place 1 spray into both nostrils 2 (two) times daily as needed for allergies or rhinitis., Disp: 16 g, Rfl: 11   Fluticasone -Umeclidin-Vilant (TRELEGY  ELLIPTA) 100-62.5-25 MCG/ACT AEPB, Inhale 1 puff into the lungs daily., Disp: 180 each, Rfl: 3   Fluticasone -Umeclidin-Vilant (TRELEGY ELLIPTA ) 100-62.5-25 MCG/ACT AEPB, Inhale 1 puff into the lungs daily., Disp: 3 each, Rfl: 4   ipratropium-albuterol  (DUONEB) 0.5-2.5 (3) MG/3ML SOLN, Take 3 mLs by nebulization every 6 (six) hours as needed (wheezing)., Disp: 90 mL, Rfl: 5   LORazepam  (ATIVAN ) 1 MG tablet, Take 1 tablet (1 mg total) by mouth 2 (two) times daily., Disp: 180 tablet, Rfl: 0   Menthol (COUGH DROPS) 5.8 MG LOZG, Use as directed 1 lozenge (5.8 mg total) in the mouth or throat as needed (for dry, sore, or scratchy throat)., Disp: 36 lozenge, Rfl: 11   metoprolol  tartrate (LOPRESSOR ) 25 MG tablet, Take 1 tablet (25 mg total) by mouth 2 (two) times daily., Disp: 180 tablet, Rfl: 1   mirtazapine  (REMERON ) 45 MG tablet, Take 1 tablet (45 mg total) by mouth at bedtime as needed (sleep)., Disp: 90 tablet, Rfl: 3   montelukast  (SINGULAIR ) 10 MG tablet, Take 1 tablet (10 mg total) by mouth at bedtime., Disp: 30 tablet, Rfl: 11   OXYGEN , Inhale 2 L/min into the lungs continuous., Disp: , Rfl:    pantoprazole  (PROTONIX ) 40 MG tablet, 09-19-23 Increase to pantoprazole  40mg  bid for 1 month, then reduce to pantoprazole  40mg  once daily, Disp: 180 tablet, Rfl: 0   rivaroxaban (XARELTO) 20 MG TABS tablet, Take 20 mg by mouth daily with supper., Disp: , Rfl:    sertraline  (ZOLOFT ) 100 MG tablet, Take  1.5 tablets (150 mg total) by mouth at bedtime., Disp: 135 tablet, Rfl: 0   Spacer/Aero-Holding Chambers (AEROCHAMBER MV) inhaler, Use as instructed, Disp: 1 each, Rfl: 0   spironolactone  (ALDACTONE ) 25 MG tablet, TAKE A HALF TABLET DAILY AS NEEDED FOR EDEMA OR FLUID RETENTION, Disp: 45 tablet, Rfl: 2   torsemide  (DEMADEX ) 20 MG tablet, TAKE 2 TABLETS DAILY, Disp: 180 tablet, Rfl: 3   Vitamin D , Ergocalciferol , (DRISDOL ) 1.25 MG (50000 UNIT) CAPS capsule, Take 1 capsule (50,000 Units total) by mouth every 7  (seven) days., Disp: 12 capsule, Rfl: 1   Medications ordered in this encounter:  No orders of the defined types were placed in this encounter.    *If you need refills on other medications prior to your next appointment, please contact your pharmacy*  Follow-Up: Call back or seek an in-person evaluation if the symptoms worsen or if the condition fails to improve as anticipated.  [facility] [phone]  Other Instructions Please continue to hydrate and rest. Keep check on your oxygen  levels making sure staying above 90% Use Mucinex  OTC as directed to thin congestion. Take the antibiotic, cough medication and steroid as directed. If you note any non-resolving, new, or worsening symptoms despite treatment, please seek an in-person evaluation ASAP.   Chronic Obstructive Pulmonary Disease Exacerbation  Chronic obstructive pulmonary disease (COPD) is a long-term (chronic) lung problem. When you have COPD, it can feel harder to breathe in or out. COPD exacerbation is a flare-up of symptoms when breathing gets worse and more treatment may be needed. Without treatment, flare-ups can be life-threatening. If they happen often, your lungs can become more damaged. What are the causes? Not taking your usual COPD medicines as told by your health care provider. A cold or the flu, which can cause infection in your lungs. Being exposed to things that make your breathing worse, such as: Smoke. Air pollution. Fumes. Dust. Allergies. Weather changes. What are the signs or symptoms? Symptoms do not get better or get worse even if you take your medicines as told by your provider. Symptoms may include: More shortness of breath. You may only be able to speak one or two words at a time. More coughing or mucus from your lungs. More wheezing or chest tightness. Being more tired and having less energy. Confusion. How is this diagnosed? This condition is diagnosed based on: Symptoms  that get worse. Your medical history. A physical exam. You may also have tests, including: A chest X-ray. Blood or mucus tests. How is this treated? You may be able to stay home or you may need to go to the hospital. Treatment may include: Taking medicines. These may include: Inhalers. These have medicines in them that you breathe in. These may be more of what you already take or they may be new. Steroids. These reduce inflammation in the airways. These may be inhaled, taken by mouth, or given in an IV. Antibiotics. These treat infection. Using oxygen . Using a device to help you clear mucus. Follow these instructions at home: Medicines Take your medicines only as told by your provider. If you were given antibiotics or steroids, take them as told by your provider. Do not stop taking them even if you start to feel better. Lifestyle Several times a day, wash your hands with soap and water for at least 20 seconds. If you cannot use soap and water, use hand sanitizer. This may help keep you from getting an infection. Avoid being around crowds or  people who are sick. Do not smoke or use any products that contain nicotine or tobacco. If you need help quitting, ask your provider. Return to your normal activities when your provider says that it's safe. Use breathing methods to control your stress and catch your breath. How is this prevented? Follow your COPD action plan. The action plan tells you what to do if you're feeling good and what to do when you start feeling worse. Discuss the plan often with your provider. Make sure you get all the shots, also called vaccines, that your provider recommends. Ask your provider about a flu shot and a pneumonia shot. Use oxygen  therapy if told by your provider. If you need home oxygen  therapy, ask your provider how often to check your oxygen  level with a device called an oximeter. Keep all follow-up visits to review your COPD action plan. Your provider  will want to check on your condition often to keep you healthy and out of the hospital. Contact a health care provider if: Your COPD symptoms get worse. You have a fever or chills. You have trouble doing daily activities. You have trouble breathing even when you are resting. Get help right away if: You are short of breath and cannot: Talk in full sentences. Do normal activities. You have chest pain. You feel confused. These symptoms may be an emergency. Call 911 right away. Do not wait to see if the symptoms will go away. Do not drive yourself to the hospital. This information is not intended to replace advice given to you by your health care provider. Make sure you discuss any questions you have with your health care provider. Document Revised: 10/12/2022 Document Reviewed: 03/27/2022 Elsevier Patient Education  2024 Elsevier [employer].   If you have been instructed to have an in-person evaluation today at a local Urgent Care facility, please use the link below. It will take you to a list of all of our available [facility], including address, phone number and hours of operation. Please do not delay care.  [facility]  If you or a family member do not have a primary care provider, use the link below to schedule a visit and establish care. When you choose a [facility] primary care physician or advanced practice provider, you gain a long-term partner in health. Find a Primary Care Provider  Learn more about [facility]'s in-office and virtual care options: [facility] - Get Care Now

## 2023-12-19 NOTE — Telephone Encounter (Signed)
 FYI Only or Action Required?: FYI only for provider: ED advised.  Patient is followed in Pulmonology for COPD, last seen on 09/27/2023 by [name], MD.  Called Nurse Triage reporting Cough.  Symptoms began a week ago.  Interventions attempted: Nebulizer treatments.  Symptoms are: gradually worsening.  Triage Disposition: Go to ED Now (Notify PCP), No Contact Calls  Patient/caregiver understands and will follow disposition?: Yes

## 2023-12-19 NOTE — Telephone Encounter (Signed)
 Thirdattempt to reach patient, left message to call back [phone].    Copied from CRM #[id number]. Topic: Clinical - Red Word Triage >> Dec 19, 2023  9:28 AM Lindsay [name] wrote: Red Word that prompted transfer to Nurse Triage: pt says she had [name] really bad coughing spell, states she coughed unitl she threw up - states she is now feeling soreness on left ribs and left side of upper chest.

## 2023-12-19 NOTE — Telephone Encounter (Signed)
Noted. Will close encounter per office protocol.

## 2023-12-19 NOTE — Telephone Encounter (Signed)
 Called to scheduled LAAO consult per Dr. Kara.  Left message to call back.

## 2023-12-19 NOTE — Telephone Encounter (Signed)
 First attempt to reach patient, left message to call back [phone].  Copied from CRM #[id number]. Topic: Clinical - Red Word Triage >> Dec 19, 2023  9:28 AM Lindsay [name] wrote: Red Word that prompted transfer to Nurse Triage: pt says she had [name] really bad coughing spell, states she coughed unitl she threw up - states she is now feeling soreness on left ribs and left side of upper chest

## 2023-12-24 NOTE — Telephone Encounter (Signed)
 Left second voicemail to schedule

## 2023-12-26 ENCOUNTER — Other Ambulatory Visit: Payer: Self-pay

## 2023-12-26 ENCOUNTER — Telehealth: Payer: Self-pay

## 2023-12-26 ENCOUNTER — Other Ambulatory Visit (HOSPITAL_BASED_OUTPATIENT_CLINIC_OR_DEPARTMENT_OTHER): Payer: Self-pay | Admitting: Nurse Practitioner

## 2023-12-26 DIAGNOSIS — J441 Chronic obstructive pulmonary disease with (acute) exacerbation: Secondary | ICD-10-CM

## 2023-12-26 DIAGNOSIS — F411 Generalized anxiety disorder: Secondary | ICD-10-CM

## 2023-12-26 DIAGNOSIS — D6869 Other thrombophilia: Secondary | ICD-10-CM | POA: Diagnosis not present

## 2023-12-26 DIAGNOSIS — R0989 Other specified symptoms and signs involving the circulatory and respiratory systems: Secondary | ICD-10-CM

## 2023-12-26 NOTE — Patient Outreach (Signed)
 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  I obtained verbal consent from Patient. Visit completed with Patient on the phone.  Background:   Past Medical History:  Diagnosis Date   Acute diastolic CHF (congestive heart failure) (HCC) 08/03/2020   Acute non-recurrent pansinusitis 10/21/2020   Acute on chronic diastolic CHF (congestive heart failure) (HCC) 08/03/2020   Acute on chronic respiratory failure with hypoxia (HCC) 08/02/2020   Acute on chronic respiratory failure with hypoxia and hypercapnia (HCC) 11/10/2022   Allergy    Anxiety    Asthma    Asthma exacerbation 08/02/2020   Atrial fibrillation (HCC)    Cataract    COPD (chronic obstructive pulmonary disease) (HCC)    Depression    Dysphagia 08/02/2020   Dyspnea 05/09/2021   Elevated troponin 09/14/2020   GERD (gastroesophageal reflux disease)    High cholesterol    Hospital discharge follow-up 11/28/2022   HTN (hypertension)    Hyperkalemia 09/14/2020   Hyponatremia 08/02/2020   Low grade squamous intraepithelial lesion on cytologic smear of cervix (LGSIL) 12/02/2019   Moderate persistent asthma without complication 12/02/2019   Non-healing wound of right lower extremity 08/01/2022   Osteoporosis    Oxygen  deficiency    Papule of skin 11/29/2022   Respiratory failure with hypoxia (HCC)    Sinus congestion 11/29/2022   Sore throat 11/16/2020    Assessment: Patient Reported Symptoms:  Cognitive Cognitive Status: Alert and oriented to person, place, and time, Normal speech and language skills Cognitive/Intellectual Conditions Management [RPT]: None reported or documented in medical history or problem list    Health Maintenance Behaviors: Annual physical exam Health Facilitated by: Rest  Neurological Neurological Review of Symptoms: Not assessed    HEENT HEENT Symptoms Reported: Not assessed      Cardiovascular Cardiovascular Symptoms Reported: Not assessed    Respiratory Respiratory Symptoms Reported: Productive cough, Shortness of breath Other Respiratory Symptoms: patient is experiencing worsening cough and shortness of breath; reviewed and discussed plan of care; collaborated with PCP provider re: mobile chest xray and patient was advised of next steps Respiratory Management Strategies: Oxygen  therapy, Medication therapy, Routine screening, Adequate rest Respiratory Self-Management Outcome: 3 (uncertain)  Endocrine Endocrine Symptoms Reported: Not assessed    Gastrointestinal Gastrointestinal Symptoms Reported: Other Other Gastrointestinal Symptoms: dysphagia; patient completed a recent barium swallow, scheduled w/GI on 01/22/24 Gastrointestinal Management Strategies: Diet modification Gastrointestinal Self-Management Outcome: 3 (uncertain)    Genitourinary Genitourinary Symptoms Reported: Not assessed    Integumentary Integumentary Symptoms Reported: Not assessed    Musculoskeletal Musculoskelatal Symptoms Reviewed: Not assessed        Psychosocial Psychosocial Symptoms Reported: Not assessed     Quality of Family Relationships: helpful, involved, supportive    12/26/2023    PHQ2-9 Depression Screening   Lindsay [name] interest or pleasure in doing things    Feeling down, depressed, or hopeless    PHQ-2 - Total Score    Trouble falling or staying asleep, or sleeping too much    Feeling tired or having Lindsay [name] energy    Poor appetite or overeating     Feeling bad about yourself - or that you are a failure or have let yourself or your family down    Trouble concentrating on things, such as  reading the newspaper or watching television    Moving or speaking so slowly that other people  could have noticed.  Or the opposite - being so fidgety or restless that you have been moving around a lot more than usual    Thoughts that you would be better off dead, or hurting yourself in some way    PHQ2-9 Total Score    If you checked off any problems, how difficult have these problems made it for you to do your work, take care of things at home, or get along with other people    Depression Interventions/Treatment      There were no vitals filed for this visit. Pain Scale: Not given for pain  Medications Reviewed Today     Reviewed by Lindsay Clayborne CROME, RN (Registered Nurse) on 12/26/23 at 1657  Med List Status: <None>   Medication Order Taking? Sig Documenting Provider Last Dose Status Informant  acetaminophen  (TYLENOL ) 500 MG tablet [id number]  Take 2 tablets (1,000 mg total) by mouth in the morning, at noon, and at bedtime. Lindsay Reche RAMAN, NP  Active   alendronate  (FOSAMAX ) 70 MG tablet [id number]  Take 1 tablet (70 mg total) by mouth once a week. Take with a full glass of water on an empty stomach. [name], Lindsay E, NP  Active   atorvastatin  (LIPITOR) 40 MG tablet [id number]  Take 1 tablet (40 mg total) by mouth daily. Lindsay Reche RAMAN, NP  Active   benzonatate  (TESSALON ) 100 MG capsule [id number]  Take 1 capsule (100 mg total) by mouth 3 (three) times daily as needed for cough. Lindsay Elsie BROCKS, [name]  Active   Calcium -Magnesium -Vitamin D  (CALCIUM  1200+D3 PO) [id number]  Take 1 tablet by mouth in the morning. [provider]  Active Self  cetirizine  (ZYRTEC ) 10 MG tablet [id number]  Take 1 tablet (10 mg total) by mouth daily. [name], Lindsay E, NP  Active   Cyanocobalamin  (B-12) 1000 MCG SUBL [id number]  Place 1 tablet under the tongue daily at 6 (six) AM. [name], Lindsay BRAVO, NP  Active   doxycycline  (VIBRA -TABS) 100 MG tablet [id number]  Take 1 tablet (100 mg total) by mouth 2 (two) times daily. Lindsay Elsie BROCKS, [name]  Active   Elastic Bandages & Supports (MEDICAL COMPRESSION STOCKINGS)  OREGON [id number]  Two pair knee high 30-81mmHg compression stockings. Sized XL. As covered by insurance for ICD-10: I50.33, R60.0. [name], Lindsay E, NP  Active Self           Med Note (GANN, RILEY C   Mon Jul 02, 2023  1:52 PM) Wants to check on it.   fluticasone  (FLONASE ) 50 MCG/ACT nasal spray [id number]  Place 1 spray into both nostrils 2 (two) times daily as needed for allergies or rhinitis. [name], MD  Active   Fluticasone -Umeclidin-Vilant (TRELEGY ELLIPTA ) 100-62.5-25 MCG/ACT AEPB [id number]  Inhale 1 puff into the lungs daily. [name], MD  Active   Fluticasone -Umeclidin-Vilant (TRELEGY ELLIPTA ) 100-62.5-25 MCG/ACT AEPB [id number]  Inhale 1 puff into the lungs daily. [name], MD  Active   ipratropium-albuterol  (DUONEB) 0.5-2.5 (3) MG/3ML SOLN [id number]  Take 3 mLs by nebulization every 6 (six) hours as needed (wheezing). [name], MD  Active   LORazepam  (ATIVAN ) 1 MG tablet [id number]  Take 1 tablet (1 mg total) by mouth 2 (two) times daily. [name], Lindsay E, NP  Active   Menthol (COUGH DROPS) 5.8 MG LOZG [id number]  Use as directed 1 lozenge (5.8 mg total) in the  mouth or throat as needed (for dry, sore, or scratchy throat). [name], Lindsay E, NP  Active Self  metoprolol  tartrate (LOPRESSOR ) 25 MG tablet [id number]  Take 1 tablet (25 mg total) by mouth 2 (two) times daily. Lindsay Reche RAMAN, NP  Active   montelukast  (SINGULAIR ) 10 MG tablet [id number]  Take 1 tablet (10 mg total) by mouth at bedtime. [name], MD  Active   OXYGEN  [id number]  Inhale 2 L/min into the lungs continuous. [provider]  Active Self  pantoprazole  (PROTONIX ) 40 MG tablet [id number]  09-19-23 Increase to pantoprazole  40mg  bid for 1 month, then reduce to pantoprazole  40mg  once daily [name], [name], [name]  Active   predniSONE  (DELTASONE ) 20 MG tablet [id number]  Take 2 tablets (40 mg total) by mouth daily with breakfast. Lindsay Elsie BROCKS, [name]  Active   rivaroxaban (XARELTO) 20  MG TABS tablet [id number]  Take 20 mg by mouth daily with supper. [provider]  Active   sertraline  (ZOLOFT ) 100 MG tablet [id number]  Take 1.5 tablets (150 mg total) by mouth at bedtime. [name], Lindsay E, NP  Active   Spacer/Aero-Holding Chambers (AEROCHAMBER MV) inhaler [id number]  Use as instructed [name], MD  Active Self  spironolactone  (ALDACTONE ) 25 MG tablet [id number]  TAKE A HALF TABLET DAILY AS NEEDED FOR EDEMA OR FLUID RETENTION Walker, [name], NP  Active   torsemide  (DEMADEX ) 20 MG tablet [id number]  TAKE 2 TABLETS DAILY Walker, [name], NP  Active   Vitamin D , Ergocalciferol , (DRISDOL ) 1.25 MG (50000 UNIT) CAPS capsule [id number]  Take 1 capsule (50,000 Units total) by mouth every 7 (seven) days. Oris Lindsay BRAVO, NP  Active             Recommendation:   PCP Follow-up 01/01/2024 Status: Sch   Time: 9:15 AM Length: 30  Visit Type: OFFICE VISIT [8002] Copay: $0.00  Provider: Oris Lindsay BRAVO, NP Department: [facility]    01/10/2024 Status: Sch   Time: 9:30 AM Length: 30  Visit Type: PATIENT OUTREACH 30 [3016] Copay: $0.00  Provider: [facility] Department: [facility]   Specialty provider follow-up   01/01/2024 Status: Sch   Time: 12:00 PM Length: 30  Visit Type: DG DEXA [150200] Copay: $0.00  Provider: ITA-IZKJ Department: [facility]    01/22/2024 Status: Sch   Time: 10:20 AM Length: 20  Visit Type: FOLLOW UP 20 [336] Copay: $0.00  Provider: Craig Alan SAUNDERS, [name] Department: [facility]    02/05/2024 Status: Sch   Time: 11:00 AM Length: 60  Visit Type: PFT - PULM [8023] Copay: $0.00  Provider: [facility] Department: [facility]   Follow Up Plan:   Telephone follow up appointment date/time   01/02/2024 Status: Sch   Time: 3:00 PM Length: 60  Visit Type: VBCI TELEPHONE CALL 60 [2503] Copay: $0.00  Provider: Ezzard Rolin BIRCH, LCSW Department: [facility]    01/18/2024 Status:  Sch   Time: 2:00 PM Length: 30  Visit Type: VBCI TELEPHONE CALL 30 [2502] Copay: $0.00  Provider: Morgan Clayborne CROME, RN Department: [facility]   02/07/2024 Status: Sch   Time: 11:30 AM Length: 30  Visit Type: OFFICE VISIT [8002] Copay: $0.00  Provider: Oris Lindsay BRAVO, NP Department: [facility]   02/14/2024 Status: Sch   Time: 9:45 AM Length: 15  Visit Type: OFFICE VISIT - PULM [8022] Copay: $0.00  Provider: [name], MD Department: [facility]   Clayborne Morgan RN BSN  CCM [place]  [facility], [facility] Health Nurse Care Coordinator  Direct Dial: [phone] Website: [email or web address]

## 2023-12-26 NOTE — Telephone Encounter (Signed)
 Copied from CRM #[id number]. Topic: Clinical - Medical Advice >> Dec 26, 2023 10:55 AM Charolett L wrote: Reason for CRM: Lonell from [facility] plan stated that theres Fluid on both the patients lungs and wanted to adv the doctor. She also stated she may need an x and if so theres a [employer] company that can come to her home or schedule an appt Mobile Xray [phone] CB# [phone]

## 2023-12-26 NOTE — Telephone Encounter (Signed)
 Last appt. 07/02/23 and has an appt. Coming up 01/01/24.

## 2023-12-26 NOTE — Patient Instructions (Signed)
 Visit Information  Thank you for taking time to visit with me today. Please don't hesitate to contact me if I can be of assistance to you before our next scheduled appointment.  Your next care management appointment is by telephone on Friday, December 26 at 2:00 PM  Please call the care guide team at [phone] if you need to cancel, schedule, or reschedule an appointment.   Please call 1-800-273-TALK (toll free, 24 hour hotline) if you are experiencing a Mental Health or Behavioral Health Crisis or need someone to talk to.  Clayborne Ly RN BSN CCM [place]  [facility], [facility] Health Nurse Care Coordinator  Direct Dial: [phone] Website: [email or web address]

## 2023-12-27 ENCOUNTER — Encounter: Payer: Self-pay | Admitting: Nurse Practitioner

## 2023-12-30 ENCOUNTER — Telehealth: Payer: Self-pay

## 2023-12-30 ENCOUNTER — Telehealth

## 2023-12-30 DIAGNOSIS — R0989 Other specified symptoms and signs involving the circulatory and respiratory systems: Secondary | ICD-10-CM | POA: Diagnosis not present

## 2023-12-30 DIAGNOSIS — R3989 Other symptoms and signs involving the genitourinary system: Secondary | ICD-10-CM

## 2023-12-30 NOTE — Telephone Encounter (Signed)
 I received a message to call back a portable chest x-ray service regarding the results of the recent chest x-ray for patient Lindsay [name].  The results are consistent with pulmonary edema concerning for congestive heart failure.  I tried calling the patient at home x 2, but they did not answer.  Her team will try to reach out to the patient again tomorrow.  In the meantime it appears that the patient has a follow-up appointment on 12/9.  The results of the chest x-ray report are also faxed over to the ordering provider.

## 2023-12-30 NOTE — Telephone Encounter (Signed)
 The patient call me back and said that they are feeling somewhat fatigued, but pretty close to their baseline.  They do not feel like they need to go to the ED and are comfortable waiting for their next appointment.

## 2023-12-31 ENCOUNTER — Telehealth: Payer: Self-pay

## 2023-12-31 ENCOUNTER — Encounter: Payer: Self-pay | Admitting: Nurse Practitioner

## 2023-12-31 DIAGNOSIS — N3090 Cystitis, unspecified without hematuria: Secondary | ICD-10-CM

## 2023-12-31 NOTE — Progress Notes (Signed)
  Because of risk for antibiotic-resistant urinary tract infection in those 65 and older, the standard of care is for a good abdominal and kidney examination to be performed and a urine culture to be obtained, prior to starting antibiotics. As such, I feel your condition warrants further evaluation and I recommend that you be seen in a face-to-face visit.   NOTE: There will be NO CHARGE for this E-Visit   If you are having a true medical emergency, please call 911.     For an urgent face to face visit, [place] has multiple urgent care centers for your convenience.  Click the link below for the full list of locations and hours, walk-in wait times, appointment scheduling options and driving directions:  Urgent Care - [place], [place], [place], [place], [place], [place]  [place]     Your MyChart E-visit questionnaire answers were reviewed by a board certified advanced clinical practitioner to complete your personal care plan based on your specific symptoms.    Thank you for using e-Visits.

## 2023-12-31 NOTE — Telephone Encounter (Signed)
 Yes and I called her back and moved her to the afternoon

## 2023-12-31 NOTE — Telephone Encounter (Signed)
 Did you call Mrs. Alfredo?  Copied from CRM #[id number]. Topic: General - Other >> Dec 31, 2023 12:39 PM Jasmin G wrote: Reason for CRM: Pt called regarding recent missed call from clinic, I could not find info to relay. Call pt back at [phone].

## 2024-01-01 ENCOUNTER — Encounter: Payer: Self-pay | Admitting: Nurse Practitioner

## 2024-01-01 ENCOUNTER — Other Ambulatory Visit (HOSPITAL_BASED_OUTPATIENT_CLINIC_OR_DEPARTMENT_OTHER)

## 2024-01-01 ENCOUNTER — Telehealth: Payer: Self-pay

## 2024-01-01 ENCOUNTER — Ambulatory Visit: Admitting: Nurse Practitioner

## 2024-01-01 VITALS — BP 120/70 | HR 64 | Wt 220.0 lb

## 2024-01-01 DIAGNOSIS — I5043 Acute on chronic combined systolic (congestive) and diastolic (congestive) heart failure: Secondary | ICD-10-CM

## 2024-01-01 DIAGNOSIS — N3090 Cystitis, unspecified without hematuria: Secondary | ICD-10-CM | POA: Diagnosis not present

## 2024-01-01 DIAGNOSIS — I5032 Chronic diastolic (congestive) heart failure: Secondary | ICD-10-CM

## 2024-01-01 DIAGNOSIS — F418 Other specified anxiety disorders: Secondary | ICD-10-CM | POA: Diagnosis not present

## 2024-01-01 DIAGNOSIS — F99 Mental disorder, not otherwise specified: Secondary | ICD-10-CM | POA: Diagnosis not present

## 2024-01-01 DIAGNOSIS — I70223 Atherosclerosis of native arteries of extremities with rest pain, bilateral legs: Secondary | ICD-10-CM | POA: Diagnosis not present

## 2024-01-01 DIAGNOSIS — F411 Generalized anxiety disorder: Secondary | ICD-10-CM

## 2024-01-01 DIAGNOSIS — R350 Frequency of micturition: Secondary | ICD-10-CM

## 2024-01-01 DIAGNOSIS — F339 Major depressive disorder, recurrent, unspecified: Secondary | ICD-10-CM

## 2024-01-01 DIAGNOSIS — F5105 Insomnia due to other mental disorder: Secondary | ICD-10-CM

## 2024-01-01 DIAGNOSIS — F5101 Primary insomnia: Secondary | ICD-10-CM

## 2024-01-01 MED ORDER — SULFAMETHOXAZOLE-TRIMETHOPRIM 800-160 MG PO TABS: 1.0000 | ORAL_TABLET | Freq: Two times a day (BID) | ORAL | 0 refills | Status: DC

## 2024-01-01 MED ORDER — TRAZODONE HCL 50 MG PO TABS: 50.0000 mg | ORAL_TABLET | Freq: Every day | ORAL | 1 refills | Status: DC

## 2024-01-01 MED ORDER — PREDNISONE 50 MG PO TABS: 50.0000 mg | ORAL_TABLET | Freq: Every day | ORAL | 0 refills | Status: DC

## 2024-01-01 NOTE — Patient Instructions (Addendum)
 I would like you to increase your Torsemide  to 3 tablets (60mg ) every morning for the next 3 days.  Then you can go back to the regular dose.  I have also sent in a prednisone  for you to take for your lungs.   I have sent in Bactrim  to the pharmacy for you to take for UTI. This is twice a day for 5 days.   I have sent in Trazodone  to help with your sleep. Take 1-2 tablets every night at bedtime.  We may need to change your Sertraline  to find a better option, but I don't want to change too much at the same time. If we can get you sleeping better, you may feel better with your anxiety level.

## 2024-01-01 NOTE — Progress Notes (Unsigned)
  Camie FORBES Doing, DNP, AGNP-c [facility] [address] [place], [place] [address] [phone]   ACUTE VISIT on 01/01/2024  There were no vitals taken for this visit.  Subjective:  HPI     ROS negative except for what is listed in HPI. History, Medications, Surgery, SDOH, and Family History reviewed and updated as appropriate.  Objective:  Physical Exam      Assessment & Plan:   Problem List Items Addressed This Visit   None      Camie FORBES Doing, DNP, AGNP-c Time: *** minutes, >50% spent counseling, care coordination, chart review, and documentation.

## 2024-01-01 NOTE — Telephone Encounter (Signed)
 Copied from CRM #[id number]. Topic: Clinical - Prescription Issue >> Jan 01, 2024 11:08 AM Willma SAUNDERS wrote: Reason for CRM: [facility] a [facility] that was just sent in for the patient for sulfamethoxazole -trimethoprim  (BACTRIM  DS) 800-160 MG tablet. The patient is already on spironolactone  (ALDACTONE ) 25 MG tablet. Tillman states that taking them together can increase potassium and wants to confirm patient should be taking both.  Tillman can be reached at [phone]

## 2024-01-02 ENCOUNTER — Encounter: Payer: Self-pay | Admitting: Nurse Practitioner

## 2024-01-02 ENCOUNTER — Other Ambulatory Visit: Payer: Self-pay | Admitting: Licensed Clinical Social Worker

## 2024-01-02 DIAGNOSIS — N3090 Cystitis, unspecified without hematuria: Secondary | ICD-10-CM | POA: Insufficient documentation

## 2024-01-02 LAB — CBC WITH DIFFERENTIAL/PLATELET
Basophils Absolute: 0 x10E3/uL (ref 0.0–0.2)
Basos: 0 %
EOS (ABSOLUTE): 0.1 x10E3/uL (ref 0.0–0.4)
Eos: 1 %
Hematocrit: 36.3 % (ref 34.0–46.6)
Hemoglobin: 11.8 g/dL (ref 11.1–15.9)
Immature Grans (Abs): 0 x10E3/uL (ref 0.0–0.1)
Immature Granulocytes: 0 %
Lymphocytes Absolute: 0.8 x10E3/uL (ref 0.7–3.1)
Lymphs: 10 %
MCH: 29.3 pg (ref 26.6–33.0)
MCHC: 32.5 g/dL (ref 31.5–35.7)
MCV: 90 fL (ref 79–97)
Monocytes Absolute: 0.6 x10E3/uL (ref 0.1–0.9)
Monocytes: 8 %
Neutrophils Absolute: 6.8 x10E3/uL (ref 1.4–7.0)
Neutrophils: 81 %
Platelets: 230 x10E3/uL (ref 150–450)
RBC: 4.03 x10E6/uL (ref 3.77–5.28)
RDW: 14 % (ref 11.7–15.4)
WBC: 8.4 x10E3/uL (ref 3.4–10.8)

## 2024-01-02 LAB — COMPREHENSIVE METABOLIC PANEL WITH GFR
ALT: 20 IU/L (ref 0–32)
AST: 25 IU/L (ref 0–40)
Albumin: 3.7 g/dL (ref 3.7–4.7)
Alkaline Phosphatase: 80 IU/L (ref 48–129)
BUN/Creatinine Ratio: 20 (ref 12–28)
BUN: 21 mg/dL (ref 8–27)
Bilirubin Total: 0.4 mg/dL (ref 0.0–1.2)
CO2: 29 mmol/L (ref 20–29)
Calcium: 8.8 mg/dL (ref 8.7–10.3)
Chloride: 102 mmol/L (ref 96–106)
Creatinine, Ser: 1.03 mg/dL — AB (ref 0.57–1.00)
Globulin, Total: 2.4 g/dL (ref 1.5–4.5)
Glucose: 85 mg/dL (ref 70–99)
Potassium: 4.3 mmol/L (ref 3.5–5.2)
Sodium: 141 mmol/L (ref 134–144)
Total Protein: 6.1 g/dL (ref 6.0–8.5)
eGFR: 54 mL/min/1.73 — AB (ref >59)

## 2024-01-02 LAB — BRAIN NATRIURETIC PEPTIDE: BNP: 187.3 pg/mL — AB (ref 0.0–100.0)

## 2024-01-02 NOTE — Assessment & Plan Note (Addendum)
 Chronic anxiety and depression with recent exacerbation due to isolation and chronic illness. Current sertraline  and lorazepam  may be ineffective. Significant impact on daily activities and social engagement. We discussed counseling and she is interested if she can find a provider who will do virtual or in home visits.  - Referred to psychologist for evaluation and potential therapy and medication management. - Will consider changing sertraline  if trazodone  does not improve symptoms. Orders:   traZODone  (DESYREL ) 50 MG tablet; Take 1-2 tablets (50-100 mg total) by mouth at bedtime.   Ambulatory referral to Psychiatry

## 2024-01-02 NOTE — Assessment & Plan Note (Addendum)
 Chronic insomnia with recent exacerbation due to nightmares and anxiety. Current lorazepam  regimen has been long term and is ineffective. We discussed concerns about respiratory depression with current medications and risks with sleeping medications. She was previously on a prescription sleep aid, but is unable to remember the name of this. We discussed trial of trazodone  and referral for additional evaluation for possible medication changes. . - Prescribed trazodone  for sleep, 1-2 tablets at bedtime. - Will evaluate effectiveness of trazodone  on sleep quality. Orders:   traZODone  (DESYREL ) 50 MG tablet; Take 1-2 tablets (50-100 mg total) by mouth at bedtime.   Ambulatory referral to Psychiatry

## 2024-01-02 NOTE — Assessment & Plan Note (Addendum)
 Chronic anxiety and depression with recent exacerbation due to isolation and chronic illness. Current sertraline  and lorazepam  may be ineffective. Significant impact on daily activities and social engagement. - Referred to psychologist for evaluation and potential therapy. - Will consider changing sertraline  if trazodone  does not improve symptoms. - Ideally would like to wean off of alprazolam, if possible with the help of psychiatry.  Orders:   traZODone  (DESYREL ) 50 MG tablet; Take 1-2 tablets (50-100 mg total) by mouth at bedtime.   Ambulatory referral to Psychiatry

## 2024-01-02 NOTE — Assessment & Plan Note (Addendum)
 Ongoing bilateral leg pain with decreased ambulation. No significant edema present on examination. Will continue to monitor and treat.

## 2024-01-02 NOTE — Assessment & Plan Note (Addendum)
 Symptoms for four days, including dysuria and urgency. Symptoms have slightly improved but remain significant. No prior prescription antibiotic treatment. Unable to provide enough urine for sample, therefore will treat prophylactically.  - Prescribed Bactrim  for UTI.

## 2024-01-02 NOTE — Assessment & Plan Note (Addendum)
 Worsening symptoms with increased fluid retention and pulmonary congestion. Recent home chest x-ray report faxed over shows signs of acute on chronic CHF exacerbation and COPD. She has been treated for COPD without change. Current torsemide  regimen may be insufficient. No significant peripheral edema noted today. 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 She prefers to avoid hospitalization. No alarm symptoms are present at this time and oxygen  saturation is maintaining (on supplemental O2).  - Increase torsemide  by one extra dose for three days (Tuesday, Wednesday, Thursday). Will consider longer increase based on labs - Prescribed prednisone  to help with pulmonary symptoms. - Checked kidney function and BNP with blood tests. Orders:   CBC with Differential/Platelet   Comprehensive metabolic panel with GFR   predniSONE  (DELTASONE ) 50 MG tablet; Take 1 tablet (50 mg total) by mouth daily.   Brain natriuretic peptide

## 2024-01-04 ENCOUNTER — Ambulatory Visit: Payer: Self-pay | Admitting: *Deleted

## 2024-01-04 ENCOUNTER — Other Ambulatory Visit: Payer: Self-pay | Admitting: Nurse Practitioner

## 2024-01-04 DIAGNOSIS — R052 Subacute cough: Secondary | ICD-10-CM

## 2024-01-04 MED ORDER — BENZONATATE 200 MG PO CAPS: 200.0000 mg | ORAL_CAPSULE | Freq: Three times a day (TID) | ORAL | 1 refills | Status: AC | PRN

## 2024-01-04 NOTE — Telephone Encounter (Signed)
 Call disconnected during transfer from PAS. NT called patient back to review sx of congestion and throat feeling clogged and medication request since cough medication lost. No answer, LVMTCB [phone].      Copied from CRM #[id number]. Topic: Clinical - Red Word Triage >> Jan 04, 2024 10:45 AM Wess RAMAN wrote: Red Word that prompted transfer to Nurse Triage: Patient lost cough medicine and would like more prescription sent to the pharmacy. Patient stated she is congested and it feels like her throat is clogged.  Callback #: [id number]  Pharmacy: [facility] [place], [place] - [address] [place] [address] Phone: [phone] Fax: [phone] Hours: Not open 24 hours

## 2024-01-04 NOTE — Telephone Encounter (Signed)
 Pt called & states her cough is no better, she lost or misplaced the bottle with a few Tessalon  Pearls.  But they are not really helping much and wanted to know if you could send her in something a little stronger to help with the cough & congestion.  (I offered her an appt she said she couldn't today)

## 2024-01-07 ENCOUNTER — Ambulatory Visit: Payer: Self-pay | Admitting: Nurse Practitioner

## 2024-01-08 NOTE — Telephone Encounter (Signed)
 Attempted to call patient.  Voicemail was full.

## 2024-01-10 ENCOUNTER — Other Ambulatory Visit

## 2024-01-10 DIAGNOSIS — E119 Type 2 diabetes mellitus without complications: Secondary | ICD-10-CM

## 2024-01-10 NOTE — Progress Notes (Cosign Needed)
 01/10/2024 Name: Lindsay [name] MRN: [id number] DOB: [birth year]  Chief Complaint  Patient presents with   Medication Management    Lindsay [name] is a 83 y.o. year old female who presented for a telephone visit.   They were referred to the pharmacist by their PCP for assistance in managing complex medication management.    Subjective:  Care Team: Primary Care Provider: Oris [name], NP ; Medication Access/Adherence  Current Pharmacy:  [facility] [place], [place] - [address] [place] [address] Phone: [phone] Fax: [phone]   Patient reports affordability concerns with their medications: No Patient reports access/transportation concerns to their pharmacy: No  Patient reports adherence concerns with their medications:  No   Medication Management: -Just started med adjustments for sleep/mood with PCP last week, has f/u on 1/8 to discuss -Still getting xarelto for free from company with no concerns  Objective:  Lab Results  Component Value Date   HGBA1C 5.9 (H) 03/22/2023    Lab Results  Component Value Date   CREATININE 1.03 (H) 01/01/2024   BUN 21 01/01/2024   NA 141 01/01/2024   K 4.3 01/01/2024   CL 102 01/01/2024   CO2 29 01/01/2024    Lab Results  Component Value Date   CHOL 155 07/02/2023   HDL 58 07/02/2023   LDLCALC 83 07/02/2023   LDLDIRECT 108 (H) 06/30/2022   TRIG 75 07/02/2023   CHOLHDL 2.7 07/02/2023    Medications Reviewed Today     Reviewed by Lionell [name], RPH (Pharmacist) on 01/10/24 at 0935  Med List Status: <None>   Medication Order Taking? Sig Documenting Provider Last Dose Status Informant  acetaminophen  (TYLENOL ) 500 MG tablet [id number]  Take 2 tablets (1,000 mg total) by mouth in the morning, at noon, and at bedtime. [name], NP  Active   alendronate  (FOSAMAX ) 70 MG tablet [id number]  Take 1 tablet (70 mg total) by mouth once a week. Take with a full glass of water on an empty  stomach. Early, [name], NP  Active   atorvastatin  (LIPITOR) 40 MG tablet [id number]  Take 1 tablet (40 mg total) by mouth daily. [name], NP  Active   benzonatate  (TESSALON ) 200 MG capsule [id number]  Take 1 capsule (200 mg total) by mouth 3 (three) times daily as needed for cough. Early, [name], NP  Active   Calcium -Magnesium -Vitamin D  (CALCIUM  1200+D3 PO) [id number]  Take 1 tablet by mouth in the morning. [provider]  Active Self  cetirizine  (ZYRTEC ) 10 MG tablet [id number]  Take 1 tablet (10 mg total) by mouth daily. Early, [name], NP  Active   Cyanocobalamin  (B-12) 1000 MCG SUBL [id number]  Place 1 tablet under the tongue daily at 6 (six) AM. Early, [name], NP  Active   Elastic Bandages & Supports (MEDICAL COMPRESSION STOCKINGS) MISC [id number]  Two pair knee high 30-34mmHg compression stockings. Sized XL. As covered by insurance for ICD-10: I50.33, R60.0. Early, [name], NP  Active Self           Med Note (GANN, RILEY C   Mon Jul 02, 2023  1:52 PM) Wants to check on it.   fluticasone  (FLONASE ) 50 MCG/ACT nasal spray [id number]  Place 1 spray into both nostrils 2 (two) times daily as needed for allergies or rhinitis. [name], MD  Active   Fluticasone -Umeclidin-Vilant (TRELEGY ELLIPTA ) 100-62.5-25 MCG/ACT AEPB [id number]  Inhale 1 puff into the lungs daily. Kara Dorn  B, MD  Active   Fluticasone -Umeclidin-Vilant (TRELEGY ELLIPTA ) 100-62.5-25 MCG/ACT AEPB [id number]  Inhale 1 puff into the lungs daily. [name], MD  Active   ipratropium-albuterol  (DUONEB) 0.5-2.5 (3) MG/3ML SOLN [id number]  Take 3 mLs by nebulization every 6 (six) hours as needed (wheezing). [name], MD  Active   LORazepam  (ATIVAN ) 1 MG tablet [id number]  Take 1 tablet (1 mg total) by mouth 2 (two) times daily. Early, [name], NP  Active   Menthol (COUGH DROPS) 5.8 MG LOZG [id number]  Use as directed 1 lozenge (5.8 mg total) in the mouth or throat as needed (for dry, sore, or scratchy  throat). Early, [name], NP  Active Self  metoprolol  tartrate (LOPRESSOR ) 25 MG tablet [id number]  Take 1 tablet (25 mg total) by mouth 2 (two) times daily. [name], NP  Active   montelukast  (SINGULAIR ) 10 MG tablet [id number]  Take 1 tablet (10 mg total) by mouth at bedtime. [name], MD  Active   OXYGEN  [id number]  Inhale 2 L/min into the lungs continuous. [provider]  Active Self  pantoprazole  (PROTONIX ) 40 MG tablet [id number]  09-19-23 Increase to pantoprazole  40mg  bid for 1 month, then reduce to pantoprazole  40mg  once daily [name], [name], [name]  Active   predniSONE  (DELTASONE ) 50 MG tablet [id number]  Take 1 tablet (50 mg total) by mouth daily. Early, [name], NP  Active   rivaroxaban (XARELTO) 20 MG TABS tablet [id number]  Take 20 mg by mouth daily with supper. [provider]  Active   sertraline  (ZOLOFT ) 100 MG tablet [id number]  Take 1.5 tablets (150 mg total) by mouth at bedtime. Early, [name], NP  Active   Spacer/Aero-Holding Chambers (AEROCHAMBER MV) inhaler [id number]  Use as instructed [name], MD  Active Self  spironolactone  (ALDACTONE ) 25 MG tablet [id number]  TAKE A HALF TABLET DAILY AS NEEDED FOR EDEMA OR FLUID RETENTION Walker, [name], NP  Active   sulfamethoxazole -trimethoprim  (BACTRIM  DS) 800-160 MG tablet [id number]  Take 1 tablet by mouth 2 (two) times daily. Early, [name], NP  Active   torsemide  (DEMADEX ) 20 MG tablet [id number]  TAKE 2 TABLETS DAILY Walker, [name], NP  Active   traZODone  (DESYREL ) 50 MG tablet [id number]  Take 1-2 tablets (50-100 mg total) by mouth at bedtime. Early, [name], NP  Active   Vitamin D , Ergocalciferol , (DRISDOL ) 1.25 MG (50000 UNIT) CAPS capsule [id number]  Take 1 capsule (50,000 Units total) by mouth every 7 (seven) days. Early, [name], NP  Active               Assessment/Plan:   Medication Management: -Continue meds as prescribed by PCP, keep f/u appt with PCP to discuss on 01/31/23 -Call  sooner than f/u with any questions/concerns    Follow Up Plan: 6 weeks  Jon VEAR Lindau, PharmD Clinical Pharmacist [phone]

## 2024-01-11 ENCOUNTER — Other Ambulatory Visit (HOSPITAL_BASED_OUTPATIENT_CLINIC_OR_DEPARTMENT_OTHER): Payer: Self-pay | Admitting: Nurse Practitioner

## 2024-01-11 DIAGNOSIS — F411 Generalized anxiety disorder: Secondary | ICD-10-CM

## 2024-01-18 ENCOUNTER — Telehealth: Payer: Self-pay

## 2024-01-18 NOTE — Patient Instructions (Signed)
 [facility] - I am sorry I was unable to reach you today for our scheduled appointment.   Your next care management appointment is by telephone on Wednesday, January 14 at 2:30 PM  Please call the care guide team at [phone] if you need to cancel, schedule, or reschedule an appointment.   Please call [phone] (toll free, 24 hour hotline) if you are experiencing a Mental Health or Behavioral Health Crisis or need someone to talk to.  Clayborne Ly RN BSN CCM [place]  [facility], [facility] Health Nurse Care Coordinator  Direct Dial: [phone] Website: [email or web address]

## 2024-01-22 ENCOUNTER — Ambulatory Visit: Admitting: Physician Assistant

## 2024-01-22 ENCOUNTER — Encounter: Payer: Self-pay | Admitting: Physician Assistant

## 2024-01-22 VITALS — BP 128/66 | HR 91 | Ht 68.0 in | Wt 222.1 lb

## 2024-01-22 DIAGNOSIS — J9611 Chronic respiratory failure with hypoxia: Secondary | ICD-10-CM

## 2024-01-22 DIAGNOSIS — R1310 Dysphagia, unspecified: Secondary | ICD-10-CM | POA: Diagnosis not present

## 2024-01-22 DIAGNOSIS — K5909 Other constipation: Secondary | ICD-10-CM

## 2024-01-22 DIAGNOSIS — Z7901 Long term (current) use of anticoagulants: Secondary | ICD-10-CM

## 2024-01-22 DIAGNOSIS — R1319 Other dysphagia: Secondary | ICD-10-CM

## 2024-01-22 DIAGNOSIS — K219 Gastro-esophageal reflux disease without esophagitis: Secondary | ICD-10-CM

## 2024-01-22 DIAGNOSIS — I5032 Chronic diastolic (congestive) heart failure: Secondary | ICD-10-CM | POA: Diagnosis not present

## 2024-01-22 DIAGNOSIS — I4819 Other persistent atrial fibrillation: Secondary | ICD-10-CM | POA: Diagnosis not present

## 2024-01-22 DIAGNOSIS — K5904 Chronic idiopathic constipation: Secondary | ICD-10-CM

## 2024-01-22 MED ORDER — PANTOPRAZOLE SODIUM 40 MG PO TBEC: 40.0000 mg | DELAYED_RELEASE_TABLET | Freq: Two times a day (BID) | ORAL | 3 refills | Status: AC

## 2024-01-22 NOTE — Progress Notes (Signed)
 "    01/22/2024 Lindsay [name] [id number] July 31, 1940  Referring provider: Oris [name], NP Primary GI doctor: Dr. Federico  ASSESSMENT AND PLAN:  Dysphagia and globulus sensation 08/04/2020 barium swallow for dysphagia showed marked esophageal dysmotility suggestive of early achalasia diminished primary peristalsis with pooling of barium in the esophagus uncoordinated tertiary contractions 08/05/2020 EGD with Dr. Elicia at [facility] for dysphagia status post dilation mid distal esophagus, abnormal esophageal motility suspicious for achalasia, gastritis, unremarkable duodenum no specimens collected 05/25/2022 barium swallow suboptimal exam inability to perform rapid bolus swallowing no definite stricture, severe esophageal dysmotility with 13 mm barium tablet becoming stuck in the midesophagus, persistent tertiary contractions mild corkscrew appearance slow passage and significant retention within the esophagus no GERD no hiatal hernia  10/08/2023 severe esophageal dysmotility barium tablet with slight sticking at GE junction 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 High risk for endoscopy due to cardiac and pulmonary comorbidities. Significant improvement with PPI at BID,  rocedural intervention deferred due to pulmonary risks and her preference. Not a surgical candidate - continue pantoprazole  to 40 mg twice daily, refill sent in - Provide information on swallowing techniques and dietary modifications. - Advise small, frequent meals and careful mastication. - Instruct to go to ER if dysphagia worsens or food impaction occurs. 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 - Recommend Miralax with a small amount of fiber to alleviate constipation. - Provide information on managing constipation.  Patient Care Team: Early, [name], NP as PCP - General (Nurse Practitioner) Mona Vinie BROCKS, MD as PCP - Cardiology (Cardiology) Ezzard Rolin BIRCH, LCSW as [facility] Care Management (Licensed Clinical Social Worker) Ostrander, [name], [facility] (Pharmacist) Little, Clayborne CROME, RN as [facility] Care Management  HISTORY OF PRESENT ILLNESS: 83 y.o. female with a past medical history listed below presents for evaluation of dysphagia.   I last saw the patient in the office 09/19/2023 for dysphagia.   Patient high risk for anesthesia with chronic hypoxic respiratory failure PAF CAD diastolic heart failure so barium swallow pursued first which showed severe dysmotility barium tablet became stuck at GE junction and underlying stricture is not excluded.  Pantoprazole  was increased to 40 mg twice daily, here to discuss potential endoscopy in the hospital  Discussed the use of AI scribe software for clinical note transcription with the patient, who gave verbal consent to proceed.  History of Present Illness   Lindsay [name] is an 83 year old female with severe esophageal dysmotility who presents for follow-up of dysphagia.  Severe esophageal dysmotility was previously confirmed by barium swallow and endoscopy with dilation in 2022, with persistent dysmotility seen on repeat barium swallow in 2024. She was last seen in August 2025 for similar symptoms.  Since increasing pantoprazole  to 40 mg twice  daily two to three months ago, swallowing has improved by approximately  50%. She reports less sensation of food being 'stuffed up', less frequent episodes of food getting stuck, and a marked reduction in excessive burping. Pantoprazole  is most effective when taken before lunch and dinner. She continues to avoid trigger foods such as bread and peanut butter, and practices slow eating with small bites, further minimizing symptoms. Food impaction now occurs infrequently, about once every two to three weeks, without odynophagia.  She reports a chronic cough that she does not associate with eating or drinking. No coughing or choking with food or liquids, and no symptoms of aspiration. Prior barium swallow studies did not show aspiration.  She experiences upper abdominal gas and occasional vomiting or 'spitting up', localized to the upper abdomen. Pepto Bismol is used as needed for these symptoms, and she inquired about additional prescription options.  She recently recovered from an illness consistent with a viral syndrome.  She is not currently taking Fosamax  or any weekly bone medication, and has not had a recent bone density test.        She  reports that she has quit smoking. Her smoking use included cigarettes. She has never used smokeless tobacco. She reports that she does not currently use alcohol. She reports that she does not use drugs.  RELEVANT GI HISTORY, IMAGING AND LABS: Results   Diagnostic Barium swallow (08/2023): Severe esophageal dysmotility; tablet transiently impacted at gastroesophageal junction; no definitive stricture or narrowing; no evidence of aspiration Barium swallow (2024): No evidence of aspiration Esophagogastroduodenoscopy (2022): Esophageal dilation performed      CBC    Component Value Date/Time   WBC 8.4 01/01/2024 1514   WBC 8.9 05/02/2023 2203   RBC 4.03 01/01/2024 1514   RBC 4.32 05/02/2023 2203   HGB 11.8 01/01/2024 1514   HCT 36.3 01/01/2024 1514    PLT 230 01/01/2024 1514   MCV 90 01/01/2024 1514   MCH 29.3 01/01/2024 1514   MCH 28.7 05/02/2023 2203   MCHC 32.5 01/01/2024 1514   MCHC 32.6 05/02/2023 2203   RDW 14.0 01/01/2024 1514   LYMPHSABS 0.8 01/01/2024 1514   MONOABS 0.7 05/02/2023 2203   EOSABS 0.1 01/01/2024 1514   BASOSABS 0.0 01/01/2024 1514   Recent Labs    03/01/23 1044 03/22/23 1232 03/22/23 1623 05/02/23 2202 05/02/23 2203 07/02/23 1531 01/01/24 1514  HGB 12.2 11.6 12.0 12.6 12.4 11.7 11.8    CMP     Component Value Date/Time   NA 141 01/01/2024 1514   K 4.3 01/01/2024 1514   CL 102 01/01/2024 1514   CO2 29 01/01/2024 1514   GLUCOSE 85 01/01/2024 1514   GLUCOSE 101 (H) 05/02/2023 2203   BUN 21 01/01/2024 1514   CREATININE 1.03 (H) 01/01/2024 1514   CREATININE 0.98 07/11/2021 1409   CALCIUM  8.8 01/01/2024 1514   PROT 6.1 01/01/2024 1514   ALBUMIN 3.7 01/01/2024 1514   AST 25 01/01/2024 1514   AST 16 01/10/2021 1317   ALT 20 01/01/2024 1514   ALT 12 01/10/2021 1317   ALKPHOS 80 01/01/2024 1514   BILITOT 0.4 01/01/2024 1514   BILITOT 0.5 01/10/2021 1317   GFRNONAA 54 (L) 05/02/2023 2203   GFRNONAA 58 (L) 07/11/2021 1409      Latest Ref Rng & Units 01/01/2024    3:14 PM 07/02/2023    3:31 PM 05/02/2023   10:03 PM  Hepatic Function  Total Protein 6.0 - 8.5 g/dL 6.1  6.4  7.0   Albumin 3.7 - 4.7 g/dL 3.7  4.1  3.6  AST 0 - 40 IU/L 25  23  34   ALT 0 - 32 IU/L 20  14  21    Alk Phosphatase 48 - 129 IU/L 80  96  77   Total Bilirubin 0.0 - 1.2 mg/dL 0.4  0.4  1.0       Current Medications:   Current Outpatient Medications (Endocrine & Metabolic):    alendronate  (FOSAMAX ) 70 MG tablet, Take 1 tablet (70 mg total) by mouth once a week. Take with a full glass of water on an empty stomach.   predniSONE  (DELTASONE ) 50 MG tablet, Take 1 tablet (50 mg total) by mouth daily.  Current Outpatient Medications (Cardiovascular):    atorvastatin  (LIPITOR) 40 MG tablet, Take 1 tablet (40 mg total) by mouth  daily.   metoprolol  tartrate (LOPRESSOR ) 25 MG tablet, Take 1 tablet (25 mg total) by mouth 2 (two) times daily.   spironolactone  (ALDACTONE ) 25 MG tablet, TAKE A HALF TABLET DAILY AS NEEDED FOR EDEMA OR FLUID RETENTION   torsemide  (DEMADEX ) 20 MG tablet, TAKE 2 TABLETS DAILY  Current Outpatient Medications (Respiratory):    benzonatate  (TESSALON ) 200 MG capsule, Take 1 capsule (200 mg total) by mouth 3 (three) times daily as needed for cough.   cetirizine  (ZYRTEC ) 10 MG tablet, Take 1 tablet (10 mg total) by mouth daily.   fluticasone  (FLONASE ) 50 MCG/ACT nasal spray, Place 1 spray into both nostrils 2 (two) times daily as needed for allergies or rhinitis.   Fluticasone -Umeclidin-Vilant (TRELEGY ELLIPTA ) 100-62.5-25 MCG/ACT AEPB, Inhale 1 puff into the lungs daily.   ipratropium-albuterol  (DUONEB) 0.5-2.5 (3) MG/3ML SOLN, Take 3 mLs by nebulization every 6 (six) hours as needed (wheezing).   montelukast  (SINGULAIR ) 10 MG tablet, Take 1 tablet (10 mg total) by mouth at bedtime.  Current Outpatient Medications (Analgesics):    acetaminophen  (TYLENOL ) 500 MG tablet, Take 2 tablets (1,000 mg total) by mouth in the morning, at noon, and at bedtime.  Current Outpatient Medications (Hematological):    cyanocobalamin  (VITAMIN B12) 1000 MCG tablet, 1,000 mcg every morning.   rivaroxaban (XARELTO) 20 MG TABS tablet, Take 20 mg by mouth daily with supper.  Current Outpatient Medications (Other):    Calcium -Magnesium -Vitamin D  (CALCIUM  1200+D3 PO), Take 1 tablet by mouth in the morning.   Elastic Bandages & Supports (MEDICAL COMPRESSION STOCKINGS) MISC, Two pair knee high 30-65mmHg compression stockings. Sized XL. As covered by insurance for ICD-10: I50.33, R60.0.   LORazepam  (ATIVAN ) 1 MG tablet, TAKE 1 TABLET BY MOUTH 2 TIMES DAILY AS NEEDED   Menthol (COUGH DROPS) 5.8 MG LOZG, Use as directed 1 lozenge (5.8 mg total) in the mouth or throat as needed (for dry, sore, or scratchy throat).   OXYGEN ,  Inhale 2 L/min into the lungs continuous.   sertraline  (ZOLOFT ) 100 MG tablet, Take 1.5 tablets (150 mg total) by mouth at bedtime.   Spacer/Aero-Holding Chambers (AEROCHAMBER MV) inhaler, Use as instructed   sulfamethoxazole -trimethoprim  (BACTRIM  DS) 800-160 MG tablet, Take 1 tablet by mouth 2 (two) times daily.   traZODone  (DESYREL ) 50 MG tablet, Take 1-2 tablets (50-100 mg total) by mouth at bedtime.   Vitamin D , Ergocalciferol , (DRISDOL ) 1.25 MG (50000 UNIT) CAPS capsule, Take 1 capsule (50,000 Units total) by mouth every 7 (seven) days.   pantoprazole  (PROTONIX ) 40 MG tablet, Take 1 tablet (40 mg total) by mouth 2 (two) times daily before a meal. 09-19-23 Increase to pantoprazole  40mg  bid for 1 month, then reduce to pantoprazole  40mg  once daily  Medical History:  Past  Medical History:  Diagnosis Date   Acute diastolic CHF (congestive heart failure) (HCC) 08/03/2020   Acute non-recurrent pansinusitis 10/21/2020   Acute on chronic diastolic CHF (congestive heart failure) (HCC) 08/03/2020   Acute on chronic respiratory failure with hypoxia (HCC) 08/02/2020   Acute on chronic respiratory failure with hypoxia and hypercapnia (HCC) 11/10/2022   Allergy    Anxiety    Asthma    Asthma exacerbation 08/02/2020   Atrial fibrillation (HCC)    Cataract    COPD (chronic obstructive pulmonary disease) (HCC)    Depression    Dysphagia 08/02/2020   Dyspnea 05/09/2021   Elevated troponin 09/14/2020   GERD (gastroesophageal reflux disease)    Globus sensation 04/03/2023   High cholesterol    History of compression fracture of spine 12/02/2019   Hospital discharge follow-up 11/28/2022   HTN (hypertension)    Hyperkalemia 09/14/2020   Hyponatremia 08/02/2020   Low grade squamous intraepithelial lesion on cytologic smear of cervix (LGSIL) 12/02/2019   Medication management 07/11/2023   Moderate persistent asthma without complication 12/02/2019   Non-healing wound of right lower extremity  08/01/2022   Osteoporosis    Oxygen  deficiency    Papule of skin 11/29/2022   Respiratory failure with hypoxia (HCC)    Sebaceous cyst 05/26/2022   Sinus congestion 11/29/2022   Sore throat 11/16/2020   Allergies:  Allergies  Allergen Reactions   Acetaminophen -Codeine Other (See Comments)    Agitation   Naproxen-Esomeprazole Mg Diarrhea   Codeine Other (See Comments)    Hallucinations      Surgical History:  She  has a past surgical history that includes Esophagogastroduodenoscopy (N/A, 08/05/2020); Cardioversion (N/A, 09/13/2020); Appendectomy; Tubal ligation; Eye surgery; and Breast surgery. Family History:  Her family history includes Breast cancer in her sister; Hypertension in an other family member.  REVIEW OF SYSTEMS  : All other systems reviewed and negative except where noted in the History of Present Illness.  PHYSICAL EXAM: BP 128/66   Pulse 91   Ht 5' 8 (1.727 m)   Wt 222 lb 2 oz (100.8 kg)   BMI 33.77 kg/m  Physical Exam   GENERAL APPEARANCE: Well nourished, in no apparent distress. HEENT: No cervical lymphadenopathy, unremarkable thyroid , sclerae anicteric, conjunctiva pink. RESPIRATORY: Respiratory effort normal, breath sounds decreased diffusely, no focal wheezing, no rales or rhonchi. 2 L Ferguson. CARDIO: Normal sinus rhythm with a small systolic murmur, peripheral pulses intact. ABDOMEN: Soft, non distended, active bowel sounds in all 4 quadrants, no tenderness to palpation, no rebound, no mass appreciated. RECTAL: Declines. MUSCULOSKELETAL: Full ROM, walks with walker, without edema. SKIN: Stasis dermatitis on both legs, dry, intact without rashes or lesions. No jaundice. NEURO: Alert, oriented, no focal deficits. PSYCH: Cooperative, normal mood and affect. EXTREMITIES: No edema in feet currently.      Alan JONELLE Coombs, [name] 10:49 AM   "

## 2024-01-22 NOTE — Patient Instructions (Addendum)
 _______________________________________________________  If your blood pressure at your visit was 140/90 or greater, please contact your primary care physician to follow up on this.  _______________________________________________________  If you are age 83 or older, your body mass index should be between 23-30. Your Body mass index is 33.77 kg/m. If this is out of the aforementioned range listed, please consider follow up with your Primary Care Provider.  If you are age 45 or younger, your body mass index should be between 19-25. Your Body mass index is 33.77 kg/m. If this is out of the aformentioned range listed, please consider follow up with your Primary Care Provider.   ________________________________________________________  The [facility] providers would like to encourage you to use MYCHART to communicate with providers for non-urgent requests or questions.  Due to long hold times on the telephone, sending your provider a message by [facility] may be a faster and more efficient way to get a response.  Please allow 48 business hours for a response.  Please remember that this is for non-urgent requests.  _______________________________________________________  [facility] is using a team-based approach to care.  Your team is made up of your doctor and two to three APPS. Our APPS (Nurse Practitioners and Physician Assistants) work with your physician to ensure care continuity for you. They are fully qualified to address your health concerns and develop a treatment plan. They communicate directly with your gastroenterologist to care for you. Seeing the Advanced Practice Practitioners on your physician's team can help you by facilitating care more promptly, often allowing for earlier appointments, access to diagnostic testing, procedures, and other specialty referrals.   Stop the fosamax  Continue pantoprazole  40 mg twice a ay 30 mins before food If symptoms worsen, call us  and we  will schedule for upper endoscopy in the hospital  VISIT SUMMARY:  Today, we discussed your ongoing issues with swallowing and esophageal dysmotility, as well as your symptoms of gas and bloating. Your current medication regimen has shown significant improvement in your symptoms.  YOUR PLAN:  ESOPHAGEAL DYSPHAGIA WITH SEVERE ESOPHAGEAL DYSMOTILITY: You have severe esophageal dysmotility, which affects your ability to swallow. Your symptoms have improved with the increased dose of pantoprazole . -Continue taking pantoprazole  40 mg twice daily, before lunch and dinner. -Keep eating slowly, taking small bites, and avoiding trigger foods like bread and peanut butter. -Use the chin tuck maneuver - go to th ER if you have a food impaction. -We will not proceed with endoscopy and dilation at this time, but we can reconsider if your symptoms worsen.  GASTROESOPHAGEAL REFLUX DISEASE (GERD): Your GERD symptoms have significantly improved with the current pantoprazole  regimen. -Continue taking pantoprazole  40 mg twice daily, before lunch and dinner.  UPPER ABDOMINAL GAS AND BLOATING: You have persistent gas and bloating, which we are managing with symptomatic therapy. -Use FDGard as needed for gas and bloating.  Follow up as needed. Call with any questions or concerns.

## 2024-01-23 ENCOUNTER — Other Ambulatory Visit: Payer: Self-pay | Admitting: Licensed Clinical Social Worker

## 2024-01-23 NOTE — Patient Outreach (Signed)
 Complex Care Management   Visit Note  01/23/2024  Name:  Lindsay [name] MRN: [id number] DOB: 19-Dec-1940  Situation: Referral received for Complex Care Management related to Mental/Behavioral Health diagnosis Anxiety and Depression I obtained verbal consent from Patient.  Visit completed with Patient  on the phone  Background:   Past Medical History:  Diagnosis Date   Acute diastolic CHF (congestive heart failure) (HCC) 08/03/2020   Acute non-recurrent pansinusitis 10/21/2020   Acute on chronic diastolic CHF (congestive heart failure) (HCC) 08/03/2020   Acute on chronic respiratory failure with hypoxia (HCC) 08/02/2020   Acute on chronic respiratory failure with hypoxia and hypercapnia (HCC) 11/10/2022   Allergy    Anxiety    Asthma    Asthma exacerbation 08/02/2020   Atrial fibrillation (HCC)    Cataract    COPD (chronic obstructive pulmonary disease) (HCC)    Depression    Dysphagia 08/02/2020   Dyspnea 05/09/2021   Elevated troponin 09/14/2020   GERD (gastroesophageal reflux disease)    Globus sensation 04/03/2023   High cholesterol    History of compression fracture of spine 12/02/2019   Hospital discharge follow-up 11/28/2022   HTN (hypertension)    Hyperkalemia 09/14/2020   Hyponatremia 08/02/2020   Low grade squamous intraepithelial lesion on cytologic smear of cervix (LGSIL) 12/02/2019   Medication management 07/11/2023   Moderate persistent asthma without complication 12/02/2019   Non-healing wound of right lower extremity 08/01/2022   Osteoporosis    Oxygen  deficiency    Papule of skin 11/29/2022   Respiratory failure with hypoxia (HCC)    Sebaceous cyst 05/26/2022   Sinus congestion 11/29/2022   Sore throat 11/16/2020    Assessment: Patient Reported Symptoms:  Cognitive Cognitive Status: No symptoms reported, Alert and oriented to person, place, and time, Normal speech and language skills Cognitive/Intellectual Conditions Management [RPT]: None reported or  documented in medical history or problem list   Health Maintenance Behaviors: Annual physical exam  Neurological Neurological Review of Symptoms: No symptoms reported    HEENT HEENT Symptoms Reported: Other: HEENT Management Strategies: Coping strategies HEENT Comment: Patient reports her throat is dry resulting in ongoing cough. Will f/up with PCP on 1/8    Cardiovascular Cardiovascular Symptoms Reported: No symptoms reported    Respiratory Respiratory Symptoms Reported: Dry cough Respiratory Management Strategies: Coping strategies, Medication therapy  Endocrine Endocrine Symptoms Reported: Not assessed    Gastrointestinal Gastrointestinal Symptoms Reported: Not assessed      Genitourinary Genitourinary Symptoms Reported: No symptoms reported    Integumentary Integumentary Symptoms Reported: No symptoms reported    Musculoskeletal Musculoskelatal Symptoms Reviewed: Not assessed        Psychosocial Psychosocial Symptoms Reported: Other, Alteration in sleep habits Other Psychosocial Conditions: Stress Behavioral Management Strategies: Adequate rest, Coping strategies, Medication therapy, Support system Behavioral Health Comment: Pt concerned about contracting the flu, noting there are approx 30 active cases in her independent living community. Strategies discussed to assist with stress management and decrease risk for contracting flu Major Change/Loss/Stressor/Fears (CP): Medical condition, self Techniques to Cope with Loss/Stress/Change: Diversional activities, Medication Quality of Family Relationships: helpful, involved, supportive Do you feel physically threatened by others?: No    01/23/2024    PHQ2-9 Depression Screening   Little interest or pleasure in doing things    Feeling down, depressed, or hopeless    PHQ-2 - Total Score    Trouble falling or staying asleep, or sleeping too much    Feeling tired or having little energy    Poor appetite  or overeating      Feeling bad about yourself - or that you are a failure or have let yourself or your family down    Trouble concentrating on things, such as reading the newspaper or watching television    Moving or speaking so slowly that other people could have noticed.  Or the opposite - being so fidgety or restless that you have been moving around a lot more than usual    Thoughts that you would be better off dead, or hurting yourself in some way    PHQ2-9 Total Score    If you checked off any problems, how difficult have these problems made it for you to do your work, take care of things at home, or get along with other people    Depression Interventions/Treatment      There were no vitals filed for this visit.    Medications Reviewed Today     Reviewed by Demetric Dunnaway D, LCSW (Social Worker) on 01/23/24 at 1033  Med List Status: <None>   Medication Order Taking? Sig Documenting Provider Last Dose Status Informant  acetaminophen  (TYLENOL ) 500 MG tablet [id number] Yes Take 2 tablets (1,000 mg total) by mouth in the morning, at noon, and at bedtime. [name], NP  Active   alendronate  (FOSAMAX ) 70 MG tablet [id number] Yes Take 1 tablet (70 mg total) by mouth once a week. Take with a full glass of water on an empty stomach. Early, [name], NP  Active   atorvastatin  (LIPITOR) 40 MG tablet [id number] Yes Take 1 tablet (40 mg total) by mouth daily. [name], NP  Active   benzonatate  (TESSALON ) 200 MG capsule [id number] Yes Take 1 capsule (200 mg total) by mouth 3 (three) times daily as needed for cough. Early, [name], NP  Active   Calcium -Magnesium -Vitamin D  (CALCIUM  1200+D3 PO) [id number] Yes Take 1 tablet by mouth in the morning. [provider]  Active Self  cetirizine  (ZYRTEC ) 10 MG tablet [id number] Yes Take 1 tablet (10 mg total) by mouth daily. Early, [name], NP  Active   cyanocobalamin  (VITAMIN B12) 1000 MCG tablet [id number]  1,000 mcg every morning. [provider]   Active   Elastic Bandages & Supports (MEDICAL COMPRESSION STOCKINGS) MISC [id number] Yes Two pair knee high 30-64mmHg compression stockings. Sized XL. As covered by insurance for ICD-10: I50.33, R60.0. Early, [name], NP  Active Self           Med Note (GANN, RILEY C   Mon Jul 02, 2023  1:52 PM) Wants to check on it.   fluticasone  (FLONASE ) 50 MCG/ACT nasal spray [id number] Yes Place 1 spray into both nostrils 2 (two) times daily as needed for allergies or rhinitis. [name], MD  Active   Fluticasone -Umeclidin-Vilant (TRELEGY ELLIPTA ) 100-62.5-25 MCG/ACT AEPB [id number] Yes Inhale 1 puff into the lungs daily. [name], MD  Active   ipratropium-albuterol  (DUONEB) 0.5-2.5 (3) MG/3ML SOLN [id number] Yes Take 3 mLs by nebulization every 6 (six) hours as needed (wheezing). [name], MD  Active   LORazepam  (ATIVAN ) 1 MG tablet [id number] Yes TAKE 1 TABLET BY MOUTH 2 TIMES DAILY AS NEEDED Early, [name], NP  Active   Menthol (COUGH DROPS) 5.8 MG LOZG [id number] Yes Use as directed 1 lozenge (5.8 mg total) in the mouth or throat as needed (for dry, sore, or scratchy throat). Early, [name], NP  Active Self  metoprolol  tartrate (LOPRESSOR ) 25 MG tablet [id number] Yes Take 1  tablet (25 mg total) by mouth 2 (two) times daily. [name], NP  Active   montelukast  (SINGULAIR ) 10 MG tablet [id number] Yes Take 1 tablet (10 mg total) by mouth at bedtime. [name], MD  Active   OXYGEN  [id number] Yes Inhale 2 L/min into the lungs continuous. [provider]  Active Self  pantoprazole  (PROTONIX ) 40 MG tablet [id number]  Take 1 tablet (40 mg total) by mouth 2 (two) times daily before a meal. 09-19-23 Increase to pantoprazole  40mg  bid for 1 month, then reduce to pantoprazole  40mg  once daily Craig Alan SAUNDERS, [name]  Active   predniSONE  (DELTASONE ) 50 MG tablet [id number] Yes Take 1 tablet (50 mg total) by mouth daily. Early, [name], NP  Active   rivaroxaban (XARELTO) 20 MG TABS tablet  [id number] Yes Take 20 mg by mouth daily with supper. [provider]  Active   sertraline  (ZOLOFT ) 100 MG tablet [id number] Yes Take 1.5 tablets (150 mg total) by mouth at bedtime. Early, [name], NP  Active   Spacer/Aero-Holding Chambers (AEROCHAMBER MV) inhaler [id number] Yes Use as instructed [name], MD  Active Self  spironolactone  (ALDACTONE ) 25 MG tablet [id number] Yes TAKE A HALF TABLET DAILY AS NEEDED FOR EDEMA OR FLUID RETENTION Walker, [name], NP  Active   sulfamethoxazole -trimethoprim  (BACTRIM  DS) 800-160 MG tablet [id number] Yes Take 1 tablet by mouth 2 (two) times daily. Early, [name], NP  Active   torsemide  (DEMADEX ) 20 MG tablet [id number] Yes TAKE 2 TABLETS DAILY Walker, [name], NP  Active   traZODone  (DESYREL ) 50 MG tablet [id number] Yes Take 1-2 tablets (50-100 mg total) by mouth at bedtime. Early, [name], NP  Active   Vitamin D , Ergocalciferol , (DRISDOL ) 1.25 MG (50000 UNIT) CAPS capsule [id number] Yes Take 1 capsule (50,000 Units total) by mouth every 7 (seven) days. Early, [name], NP  Active             Recommendation:   Continue Current Plan of Care  Follow Up Plan:   Telephone follow-up in 1 month  Rolin Kerns, LCSW [facility]  [facility], [facility] Clinical Social Worker Direct Dial: [phone]  Fax: [phone] Website: [email or web address] 10:43 AM

## 2024-01-23 NOTE — Patient Instructions (Signed)
 Visit Information  Thank you for taking time to visit with me today. Please don't hesitate to contact me if I can be of assistance to you before our next scheduled appointment.  Your next care management appointment is by telephone on 02/04 at 10 AM  Please call the care guide team at [phone] if you need to cancel, schedule, or reschedule an appointment.   Please call the Suicide and Crisis Lifeline: 988 go to [facility] Urgent [facility] [address], [place] [phone]) call 911 if you are experiencing a Mental Health or Behavioral Health Crisis or need someone to talk to.  Rolin Kerns, LCSW [place]  [facility], [facility] Clinical Social Worker Direct Dial: [phone]  Fax: [phone] Website: [email or web address] 10:44 AM

## 2024-01-23 NOTE — Patient Instructions (Signed)
 Visit Information  Thank you for taking time to visit with me today. Please don't hesitate to contact me if I can be of assistance to you before our next scheduled appointment.  Your next care management appointment is by telephone on 12/31 at 10 AM  Please call the care guide team at [phone] if you need to cancel, schedule, or reschedule an appointment.   Please call the Suicide and Crisis Lifeline: 988 go to [facility] Urgent [facility] [address] [place] [phone]) call 911 if you are experiencing a Mental Health or Behavioral Health Crisis or need someone to talk to.  Rolin Kerns, LCSW [place]  [facility], [facility] Clinical Social Worker Direct Dial: [phone]  Fax: [phone] Website: [email or web address] 2:46 PM

## 2024-01-25 ENCOUNTER — Other Ambulatory Visit: Payer: Self-pay | Admitting: Nurse Practitioner

## 2024-01-25 NOTE — Progress Notes (Signed)
 Patient experiencing severe esophageal dysmotility requiring pantoprazole  40mg  BID for treatment by GI. Recommendatins to stop Fosamax  and consider Prolia or similar for management.

## 2024-01-28 ENCOUNTER — Telehealth: Payer: Self-pay

## 2024-01-28 ENCOUNTER — Other Ambulatory Visit (HOSPITAL_COMMUNITY): Payer: Self-pay

## 2024-01-28 NOTE — Telephone Encounter (Signed)
 Received approval letter from [facility] (Xarelto) thru 01/22/2025 approval letter index.

## 2024-01-29 ENCOUNTER — Encounter (HOSPITAL_BASED_OUTPATIENT_CLINIC_OR_DEPARTMENT_OTHER): Admitting: Pulmonary Disease

## 2024-01-31 ENCOUNTER — Other Ambulatory Visit (HOSPITAL_BASED_OUTPATIENT_CLINIC_OR_DEPARTMENT_OTHER): Payer: Self-pay | Admitting: Nurse Practitioner

## 2024-01-31 ENCOUNTER — Encounter: Payer: Self-pay | Admitting: Nurse Practitioner

## 2024-01-31 ENCOUNTER — Ambulatory Visit (INDEPENDENT_AMBULATORY_CARE_PROVIDER_SITE_OTHER): Admitting: Nurse Practitioner

## 2024-01-31 VITALS — BP 122/72 | HR 68 | Wt 225.0 lb

## 2024-01-31 DIAGNOSIS — I342 Nonrheumatic mitral (valve) stenosis: Secondary | ICD-10-CM | POA: Diagnosis not present

## 2024-01-31 DIAGNOSIS — F418 Other specified anxiety disorders: Secondary | ICD-10-CM | POA: Diagnosis not present

## 2024-01-31 DIAGNOSIS — R059 Cough, unspecified: Secondary | ICD-10-CM | POA: Diagnosis not present

## 2024-01-31 DIAGNOSIS — F5104 Psychophysiologic insomnia: Secondary | ICD-10-CM | POA: Diagnosis not present

## 2024-01-31 DIAGNOSIS — I4819 Other persistent atrial fibrillation: Secondary | ICD-10-CM | POA: Diagnosis not present

## 2024-01-31 DIAGNOSIS — I5043 Acute on chronic combined systolic (congestive) and diastolic (congestive) heart failure: Secondary | ICD-10-CM

## 2024-01-31 DIAGNOSIS — N390 Urinary tract infection, site not specified: Secondary | ICD-10-CM | POA: Diagnosis not present

## 2024-01-31 DIAGNOSIS — R0602 Shortness of breath: Secondary | ICD-10-CM

## 2024-01-31 DIAGNOSIS — R0989 Other specified symptoms and signs involving the circulatory and respiratory systems: Secondary | ICD-10-CM

## 2024-01-31 LAB — POCT INFLUENZA A/B
Influenza A, POC: NEGATIVE
Influenza B, POC: NEGATIVE

## 2024-01-31 LAB — POC COVID19 BINAXNOW: SARS Coronavirus 2 Ag: NEGATIVE

## 2024-01-31 LAB — POCT URINALYSIS DIP (CLINITEK)
Blood, UA: NEGATIVE
Glucose, UA: 250 mg/dL — AB
Nitrite, UA: POSITIVE — AB
POC PROTEIN,UA: 30 — AB
Spec Grav, UA: 1.01
Urobilinogen, UA: 4 U/dL — AB
pH, UA: 6.5

## 2024-01-31 MED ORDER — PHENAZOPYRIDINE HCL 200 MG PO TABS: 200.0000 mg | ORAL_TABLET | Freq: Three times a day (TID) | ORAL | 0 refills | Status: AC | PRN

## 2024-01-31 MED ORDER — SULFAMETHOXAZOLE-TRIMETHOPRIM 800-160 MG PO TABS: 1.0000 | ORAL_TABLET | Freq: Two times a day (BID) | ORAL | 0 refills | Status: AC

## 2024-01-31 MED ORDER — BELSOMRA 5 MG PO TABS: 5.0000 mg | ORAL_TABLET | Freq: Every evening | ORAL | 0 refills | Status: AC | PRN

## 2024-01-31 MED ORDER — PREDNISONE 10 MG PO TABS: ORAL_TABLET | ORAL | 0 refills | Status: AC

## 2024-01-31 NOTE — Progress Notes (Signed)
 "  Lindsay Doing, DNP, AGNP-c [facility]  [address] [place], [place] [address] [phone]   ESTABLISHED PATIENT- Chronic Health and/or Follow-Up Visit on 01/31/2024  Blood pressure 122/72, pulse 68, weight 225 lb (102.1 kg), SpO2 (!) 89%.   Subjective:  other (F/u on mood and sleep, not sleeping much, took one for a week did not work, took 1 and half an now two a night still not sleeping, has trouble falling asleep, cough, congestion, runny nose, felt really bad, would like to check urine, perdium medicine turns urine orange, more SOB than usual, and ST, )   History of Present Illness Lindsay [name] is an 84 year old female with recurrent urinary tract infections and respiratory issues who presents with generalized shortness and respiratory symptoms. She is also here to follow-up on sleep and mood.  She has been experiencing suprapubic pain and pressure for the past week.. She has been using over-the-counter Azo for urinary symptoms, suspecting a urinary tract infection, as she had one about a month ago. She has a history of chronic UTI.   She reports respiratory issues, including shortness of breath and a persistent cough, which has led to a sore throat. Her oxygen  saturation today was 89, which she reports is lower than her usual 90s. No fever or chills are present, but she is spitting up phlegm. She uses a nebulizer with albuterol  and ipratropium three times a day, which helps her breathing. A previous course of steroids improved her breathing after the second day. Her breathing is affecting her mood and ability to ambulate as she would like. She has a history of chronic respiratory failure requiring oxygen  and is seen by pulmonology. She has been seeing cardiology but has not been seen by a MD in the past year and would like to switch to a new provider given her history of Afib and CHF.   She has been experiencing sleep disturbances and has been taking trazodone  at  night, starting with one tablet (50mg ) and increasing to two tablets (100mg ) over the past three nights, without improvement. She has a history of taking Ambien, which was effective but discontinued due to her age several years ago. She is currently on sertraline  and mirtazapine  for mood, taking one and a half tablets of sertraline  (150mg ). She also takes lorazepam  1mg  BID and has been on this dosage for many years. She expresses concern about her mood, describing it as unchanged and possibly related to her sleep issues. She is open to trying additional medications, but would also like to speak with a therapist, preferable in home visits or on the phone, as transportation is a concern. A referral was placed at her last visit, but she has not heard from anyone.   She mentions swelling and pain in her feet, which started three days ago. She takes torsemide  twice a day, which she reports helps with swelling, but she is having more swelling than usual.   She lives in a retirement home and is aware of a recent flu outbreak there. She has not had her flu shot due to ongoing respiratory symptoms.  ROS negative except for what is listed in HPI. History, Medications, Surgery, SDOH, and Family History reviewed and updated as appropriate.  Objective:  Physical Exam Vitals and nursing note reviewed.  Constitutional:      General: She is not in acute distress.    Appearance: She is obese. She is not ill-appearing, toxic-appearing or diaphoretic.  HENT:  Head: Normocephalic.     Right Ear: Tympanic membrane normal.     Left Ear: Tympanic membrane normal.     Nose: Nose normal.     Mouth/Throat:     Mouth: Mucous membranes are moist.     Pharynx: Oropharynx is clear. No posterior oropharyngeal erythema.  Eyes:     General: No scleral icterus.    Pupils: Pupils are equal, round, and reactive to light.  Cardiovascular:     Rate and Rhythm: Normal rate. Rhythm irregular.     Pulses: Normal pulses.      Heart sounds: Murmur heard.  Pulmonary:     Breath sounds: Wheezing, rhonchi and rales present.     Comments: Very mild scattered rhochi present. Improvement from visit 4 weeks prior. Very soft rales noted in the bases bilaterally.  Abdominal:     Tenderness: There is abdominal tenderness. There is no right CVA tenderness or left CVA tenderness.  Musculoskeletal:     Cervical back: No tenderness.     Right lower leg: Edema present.     Left lower leg: Edema present.  Lymphadenopathy:     Cervical: No cervical adenopathy.  Skin:    General: Skin is warm and dry.     Capillary Refill: Capillary refill takes less than 2 seconds.  Neurological:     Mental Status: She is alert and oriented to person, place, and time.     Motor: Weakness present.     Coordination: Coordination abnormal.     Gait: Gait abnormal.  Psychiatric:        Mood and Affect: Mood normal.         Assessment & Plan:   Assessment & Plan Anxiety associated with depression Persistent symptoms with no significant improvement despite additional of trazodone  (both 50 and 100mg  doses) at bedtime. Current medications include sertraline  and mirtazapine . Discussed potential addition of bupropion if mood does not improve after addressing sleep and respiratory issues. She would like to start with addressing sleep and then escalate if this is still a factor. Information on how to contact [facility] for counseling services.  - Continue sertraline  and mirtazapine . - Will consider adding bupropion if mood does not improve after addressing sleep and respiratory issues. - Stop trazodone  at night.     Acute on chronic combined systolic (congestive) and diastolic (congestive) heart failure (HCC) Increased shortness of breath and peripheral edema. Scattered rhonchi present in the lungs with very light rales in the bases. Significant improvement noted from her last visit. Suspect that she does have additional fluid and  COPD exacerbation given her lower O2 saturations and shortness of breath. Current management includes torsemide . Discussed increasing torsemide  for the next 3 days to see if additional diuresis is helpful for symptom management.  - Increase torsemide  to two tablets in the morning and one additional tablet in the afternoon for Friday, Saturday, and Sunday to manage fluid overload. - Referral for new cardiologist for further evaluation and management. Orders:   Ambulatory referral to Cardiology  Psychophysiological insomnia Chronic insomnia with no improvement despite trazodone . She is already taking mirtazapine  and is on benzodiazepine therapy during the day. Discussed the limited medication options given very high risk of respiratory depression and falls with sedating medications. Consider low dose Belsomra  as this is less likely to cause respiratory depression than other options. Will start at low dose with max optional dose of 10mg  given age and co-morbidities. Discussed risks of increased sleepiness and falls with this medication  and the importance of extreme caution.  - Prescribed belsomra  for sleep management. Orders:   Suvorexant  (BELSOMRA ) 5 MG TABS; Take 1 tablet (5 mg total) by mouth at bedtime as needed (for sleep).  Cough, unspecified type Chest congestion SOB (shortness of breath) Cough, sore throat, and wheezing. Negative flu and covid test. No fever or chills, no sinus symptoms. Previous steroid treatment was effective. Suspect COPD exacerbation. Scattered rhonchi present in the lungs.  - Initiated a long steroid taper to manage respiratory symptoms. - Continue using nebulizer with ipratropium and albuterol  as needed.   Orders:   POC COVID-19   Influenza A/B   predniSONE  (DELTASONE ) 10 MG tablet; Take 40mg  daily for 5 days, 30mg  daily for 3 days, 20mg  daily for 3 days, and 10mg  daily for 3 days.  Chronic UTI Recurrent urinary tract infections with current symptoms suggestive  of UTI. Previous urine tests showed bacteria without culture. Will obtain culture today.  - Prescribed bactrim  for UTI treatment. - Ordered urine culture to identify specific bacteria and ensure appropriate antibiotic coverage. Orders:   phenazopyridine  (PYRIDIUM ) 200 MG tablet; Take 1 tablet (200 mg total) by mouth 3 (three) times daily as needed (urinary pain).   sulfamethoxazole -trimethoprim  (BACTRIM  DS) 800-160 MG tablet; Take 1 tablet by mouth 2 (two) times daily.   POCT URINALYSIS DIP (CLINITEK)   Urine Culture  Persistent atrial fibrillation (HCC) Chronic condition requiring ongoing management. On anticoagulant. Irregular rhythm with controlled rate today.  Orders:   Ambulatory referral to Cardiology  Nonrheumatic mitral valve stenosis  Orders:   Ambulatory referral to Cardiology    Camie FORBES Doing, DNP, AGNP-c  55 minutes spent on care provided today. Time includes care provided during the visit (>50% total time), care coordination, chart review, and documentation.   "

## 2024-01-31 NOTE — Assessment & Plan Note (Signed)
 Increased shortness of breath and peripheral edema. Scattered rhonchi present in the lungs with very light rales in the bases. Significant improvement noted from her last visit. Suspect that she does have additional fluid and COPD exacerbation given her lower O2 saturations and shortness of breath. Current management includes torsemide . Discussed increasing torsemide  for the next 3 days to see if additional diuresis is helpful for symptom management.  - Increase torsemide  to two tablets in the morning and one additional tablet in the afternoon for Friday, Saturday, and Sunday to manage fluid overload. - Referral for new cardiologist for further evaluation and management. Orders:   Ambulatory referral to Cardiology

## 2024-01-31 NOTE — Assessment & Plan Note (Addendum)
 Chronic insomnia with no improvement despite trazodone . She is already taking mirtazapine  and is on benzodiazepine therapy during the day. Discussed the limited medication options given very high risk of respiratory depression and falls with sedating medications. Consider low dose Belsomra  as this is less likely to cause respiratory depression than other options. Will start at low dose with max optional dose of 10mg  given age and co-morbidities. Discussed risks of increased sleepiness and falls with this medication and the importance of extreme caution.  - Prescribed belsomra  for sleep management. Orders:   Suvorexant  (BELSOMRA ) 5 MG TABS; Take 1 tablet (5 mg total) by mouth at bedtime as needed (for sleep).

## 2024-01-31 NOTE — Assessment & Plan Note (Addendum)
 Persistent symptoms with no significant improvement despite additional of trazodone  (both 50 and 100mg  doses) at bedtime. Current medications include sertraline  and mirtazapine . Discussed potential addition of bupropion if mood does not improve after addressing sleep and respiratory issues. She would like to start with addressing sleep and then escalate if this is still a factor. Information on how to contact [facility] for counseling services.  - Continue sertraline  and mirtazapine . - Will consider adding bupropion if mood does not improve after addressing sleep and respiratory issues. - Stop trazodone  at night.

## 2024-01-31 NOTE — Assessment & Plan Note (Signed)
"       Orders:    Ambulatory referral to Cardiology    "

## 2024-01-31 NOTE — Patient Instructions (Addendum)
 For Friday Saturday and Sunday take 2 torsemide  in the morning and then 1 additional torsemide  in the afternoon.   Start the steroid tomorrow morning. This is a long taper over several days. This will help with the shortness of breath.   Start the antibiotic as soon as you get this. Take for 10 days. You can take the Pyridium  prescription for urinary pain.   Call [facility] to schedule counseling. [address], [address] [place], [place], [address] Phone: [phone]  I have asked to resend the cardiology referral to see if we can get you in with someone quickly.

## 2024-01-31 NOTE — Assessment & Plan Note (Signed)
 Chronic condition requiring ongoing management. On anticoagulant. Irregular rhythm with controlled rate today.  Orders:   Ambulatory referral to Cardiology

## 2024-02-02 LAB — URINE CULTURE

## 2024-02-04 ENCOUNTER — Other Ambulatory Visit (HOSPITAL_COMMUNITY): Payer: Self-pay | Admitting: Nurse Practitioner

## 2024-02-04 ENCOUNTER — Encounter: Payer: Self-pay | Admitting: Nurse Practitioner

## 2024-02-04 NOTE — Addendum Note (Signed)
 Addended by: VICCI HUSBAND A on: 02/04/2024 12:16 PM   Modules accepted: Orders

## 2024-02-04 NOTE — Progress Notes (Signed)
" ° °  Placed order for prolia to [facility] "

## 2024-02-05 ENCOUNTER — Encounter

## 2024-02-06 ENCOUNTER — Telehealth (HOSPITAL_COMMUNITY): Payer: Self-pay | Admitting: Pharmacy Technician

## 2024-02-06 ENCOUNTER — Ambulatory Visit: Payer: Self-pay | Admitting: Nurse Practitioner

## 2024-02-06 ENCOUNTER — Telehealth: Payer: Self-pay

## 2024-02-06 NOTE — Telephone Encounter (Signed)
 Auth Submission: APPROVED Site of care: [facility] Payer: [employer] MEDICARE Medication & CPT/J Code(s) submitted: Stuboclo (denosumab-bmwo) [id number] Diagnosis Code: M81.0 Route of submission (phone, fax, portal): [facility] PORTAL Phone # Fax # Auth type: Buy/Bill HB Units/visits requested: 60mg  x 2 doses, q 6 months Reference number: [id number]  Approval from: 02/07/24 to 02/06/25    Dagoberto Armour, CPhT [facility] Phone: [phone] 02/06/2024

## 2024-02-06 NOTE — Patient Instructions (Signed)
 Lindsay [name] - I am sorry I was unable to reach you today for our scheduled appointment.   Your next care management appointment is by telephone on Tuesday, January 27 at 3:30 PM  Please call the care guide team at [phone] if you need to cancel, schedule, or reschedule an appointment.   Please call 1-800-273-TALK (toll free, 24 hour hotline) if you are experiencing a Mental Health or Behavioral Health Crisis or need someone to talk to.  Clayborne Ly RN BSN CCM [place]  [facility], [facility] Health Nurse Care Coordinator  Direct Dial: [phone] Website: [email or web address]

## 2024-02-07 ENCOUNTER — Other Ambulatory Visit (HOSPITAL_COMMUNITY): Payer: Self-pay

## 2024-02-07 ENCOUNTER — Other Ambulatory Visit: Payer: Self-pay

## 2024-02-07 ENCOUNTER — Other Ambulatory Visit: Payer: Self-pay | Admitting: Nurse Practitioner

## 2024-02-07 ENCOUNTER — Ambulatory Visit: Admitting: Nurse Practitioner

## 2024-02-07 ENCOUNTER — Encounter (HOSPITAL_COMMUNITY): Payer: Self-pay | Admitting: Nurse Practitioner

## 2024-02-07 ENCOUNTER — Ambulatory Visit: Payer: Self-pay

## 2024-02-07 ENCOUNTER — Telehealth: Payer: Self-pay | Admitting: Pharmacy Technician

## 2024-02-07 DIAGNOSIS — B370 Candidal stomatitis: Secondary | ICD-10-CM

## 2024-02-07 MED ORDER — CLOTRIMAZOLE 10 MG MT TROC: OROMUCOSAL | 0 refills | Status: AC

## 2024-02-07 NOTE — Telephone Encounter (Signed)
 FYI Only or Action Required?: FYI only for provider: appointment scheduled on 02/11/24.  Patient was last seen in primary care on 01/31/2024 by Early, [name], NP.  Called Nurse Triage reporting Mouth Lesions.  Symptoms began several days ago.  Interventions attempted: Nothing.  Symptoms are: unchanged.  Triage Disposition: See PCP Within 2 Weeks  Patient/caregiver understands and will follow disposition?: Yes  Reason for Disposition  All other mouth symptoms  (Exceptions: Dry mouth from not drinking enough liquids, chapped lips.)  Answer Assessment - Initial Assessment Questions Patient states that she was recently in office for infection and was given a course of steroids and antibiotics that she has taken. She states that about 4-5 days ago she started to feel a burning pain in her mouth and later some small red bumps appeared as well. She is having some mild difficulty swallowing, but denies any other symptoms. Office visit advised. Patient is requesting a prescription for something to help verus coming into office if possible.   1. SYMPTOM: What's the main symptom you're concerned about? (e.g., chapped lips, dry mouth, lump, sores)     Sores/bumps in mouth  2. ONSET: When did the  symptoms  start?     4-5 days ago  3. PAIN: Is there any pain? If Yes, ask: How bad is it? (Scale: 0-10; or none, mild, moderate, severe)     7/10  4. CAUSE: What do you think is causing the symptoms?     Not sure  5. OTHER SYMPTOMS: Do you have any other symptoms? (e.g., fever, sore throat, toothache, swelling)     Burning pain, mild difficulty swallowing  6. PREGNANCY: Is there any chance you are pregnant? When was your last menstrual period?     NA  Protocols used: Mouth Symptoms-A-AH  Copied from CRM [id number]. Topic: Clinical - Red Word Triage >> Feb 07, 2024  2:17 PM Amber H wrote: [facility] that prompted transfer to Nurse Triage: Patient stated her inside of her mouth is  broken out in blisters. Lips very dry.  She has been sick Dr. Oris prescribed her some medication. Has issues eating and drinking.

## 2024-02-07 NOTE — Telephone Encounter (Signed)
 Pharmacy Patient Advocate Encounter   Received notification from onbase temporary supply letter from patient's insurance that prior authorization for Belsomra  5MG  tablets is required/requested.   Insurance verification completed.   The patient is insured through [employer].   Per test claim: PA required; PA submitted to above mentioned insurance via Latent Key/confirmation #/EOC [id number] Status is pending

## 2024-02-07 NOTE — Telephone Encounter (Signed)
 Pharmacy Patient Advocate Encounter  Received notification from [facility] that Prior Authorization for Belsomra  5MG  tablets has been APPROVED from 01/24/2024 to 01/22/2025. Ran test claim, Copay is $469.08. This test claim was processed through [facility]- copay amounts may vary at other pharmacies due to pharmacy/plan contracts, or as the patient moves through the different stages of their insurance plan.   PA #/Case ID/Reference #: [id number]  Copay applied to the deductible of $615.00.

## 2024-02-11 ENCOUNTER — Ambulatory Visit: Admitting: Family Medicine

## 2024-02-11 ENCOUNTER — Other Ambulatory Visit: Payer: Self-pay | Admitting: Nurse Practitioner

## 2024-02-11 ENCOUNTER — Encounter (HOSPITAL_COMMUNITY): Payer: Self-pay | Admitting: Nurse Practitioner

## 2024-02-11 DIAGNOSIS — E559 Vitamin D deficiency, unspecified: Secondary | ICD-10-CM

## 2024-02-14 ENCOUNTER — Ambulatory Visit: Admitting: Pulmonary Disease

## 2024-02-15 ENCOUNTER — Other Ambulatory Visit: Payer: Self-pay | Admitting: Pulmonary Disease

## 2024-02-15 ENCOUNTER — Other Ambulatory Visit: Payer: Self-pay | Admitting: Nurse Practitioner

## 2024-02-15 DIAGNOSIS — F411 Generalized anxiety disorder: Secondary | ICD-10-CM

## 2024-02-15 DIAGNOSIS — J302 Other seasonal allergic rhinitis: Secondary | ICD-10-CM

## 2024-02-15 DIAGNOSIS — F3341 Major depressive disorder, recurrent, in partial remission: Secondary | ICD-10-CM

## 2024-02-15 DIAGNOSIS — J4521 Mild intermittent asthma with (acute) exacerbation: Secondary | ICD-10-CM

## 2024-02-19 ENCOUNTER — Telehealth: Payer: Self-pay | Admitting: Internal Medicine

## 2024-02-19 ENCOUNTER — Telehealth: Payer: Self-pay

## 2024-02-19 NOTE — Telephone Encounter (Signed)
 Copied from CRM [id number]. Topic: Clinical - Medication Question >> Feb 19, 2024 10:53 AM Gattis SQUIBB wrote: Reason for CRM: pt called asking if Lindsay [name] wants to take her off her medication for her bones.  She takes this once a week.  Please advise  [phone]  She also needs a refill on the Pantoprazole  40 mg  [facility]

## 2024-02-19 NOTE — Patient Instructions (Signed)
 Dagoberto Pass - I am sorry I was unable to reach you today for our scheduled appointment. I work with Early, [name], NP and am calling to support your healthcare needs. Please contact me at [phone] at your earliest convenience. I look forward to speaking with you soon.   Thank you,   Clayborne Ly RN BSN CCM [place]  [facility], [facility] Health Nurse Care Coordinator  Direct Dial: [phone] Website: [email or web address]

## 2024-02-21 NOTE — Telephone Encounter (Signed)
 Prolia order was placed already by me on 02/04/2024

## 2024-02-21 NOTE — Telephone Encounter (Signed)
 Please call Lindsay [name].  I do need her to stop the medication for her bones right now (alendronate ). The GI (stomach doctor) found that her esophagus is not moving medication and food down as well as it should. This causes significant risks of the medication causing damage to the esophagus.   I am going to see if we can get approval for an injection form of medication that would be safer for her. Please send a referral to the infusion center to consider Prolia.

## 2024-02-22 ENCOUNTER — Emergency Department (HOSPITAL_COMMUNITY)

## 2024-02-22 ENCOUNTER — Encounter (HOSPITAL_COMMUNITY): Payer: Self-pay

## 2024-02-22 ENCOUNTER — Other Ambulatory Visit: Payer: Self-pay

## 2024-02-22 ENCOUNTER — Emergency Department (HOSPITAL_COMMUNITY)
Admission: EM | Admit: 2024-02-22 | Discharge: 2024-02-22 | Disposition: A | Discharge status: Home / Self Care | Attending: Emergency Medicine | Admitting: Emergency Medicine

## 2024-02-22 ENCOUNTER — Encounter (HOSPITAL_BASED_OUTPATIENT_CLINIC_OR_DEPARTMENT_OTHER)

## 2024-02-22 ENCOUNTER — Encounter (HOSPITAL_BASED_OUTPATIENT_CLINIC_OR_DEPARTMENT_OTHER): Admitting: Pulmonary Disease

## 2024-02-22 DIAGNOSIS — S3992XA Unspecified injury of lower back, initial encounter: Secondary | ICD-10-CM | POA: Diagnosis present

## 2024-02-22 DIAGNOSIS — I509 Heart failure, unspecified: Secondary | ICD-10-CM | POA: Insufficient documentation

## 2024-02-22 DIAGNOSIS — Z7901 Long term (current) use of anticoagulants: Secondary | ICD-10-CM | POA: Diagnosis not present

## 2024-02-22 DIAGNOSIS — W1830XA Fall on same level, unspecified, initial encounter: Secondary | ICD-10-CM | POA: Insufficient documentation

## 2024-02-22 DIAGNOSIS — W19XXXA Unspecified fall, initial encounter: Secondary | ICD-10-CM

## 2024-02-22 DIAGNOSIS — E119 Type 2 diabetes mellitus without complications: Secondary | ICD-10-CM | POA: Insufficient documentation

## 2024-02-22 MED ORDER — LIDOCAINE 5 % EX PTCH
1.0000 | MEDICATED_PATCH | CUTANEOUS | Status: DC
  Administered 2024-02-22: 1 via TRANSDERMAL
  Filled 2024-02-22: qty 1

## 2024-02-22 MED ORDER — LIDOCAINE 5 % EX PTCH: 1.0000 | MEDICATED_PATCH | CUTANEOUS | 0 refills | Status: AC

## 2024-02-22 MED ORDER — OXYCODONE-ACETAMINOPHEN 5-325 MG PO TABS
1.0000 | ORAL_TABLET | Freq: Once | ORAL | Status: AC
  Administered 2024-02-22: 1 via ORAL
  Filled 2024-02-22: qty 1

## 2024-02-22 MED ORDER — OXYCODONE-ACETAMINOPHEN 5-325 MG PO TABS: 1.0000 | ORAL_TABLET | Freq: Four times a day (QID) | ORAL | 0 refills | Status: DC | PRN

## 2024-02-22 NOTE — ED Triage Notes (Signed)
 Pt BIB [facility] from SNF d/t a fall yesterday & c/o low back pain & Lt shoulder pain. Is on Eliquis , did not hit her head, no LOC. 2L O2 via n/c at baseline. Pt reports her SBP is usually in the 90's, EMS BP was 100/64 then 120/78, 96 bpm, 96% on her O2, CBG 195.

## 2024-02-22 NOTE — ED Provider Notes (Signed)
 " [place] EMERGENCY DEPARTMENT AT [facility] Provider Note   CSN: [id number] Arrival date & time: 02/22/24  1312     Patient presents with: FOT: Eliquis  and Not hit head   Lindsay [name] is a 84 y.o. female.  {Add pertinent medical, surgical, social history, OB history to HPI:32947} HPI     84 year old female comes in with chief complaint of mechanical fall.  Patient currently resides at nursing home.  She has history of chronic hypoxic respiratory failure, CHF, diabetes and is on anticoagulation because of chronic thrombophilia and A-fib.  Patient reports that 2 days ago she had a mechanical fall.  She fell onto her left side, struck her shoulder and injured her back.  Patient did not strike her head.  She has no headache, neck pain, nausea, vomiting, vision changes, any new one-sided weakness or numbness.  She however does have severe back pain.  Pain is severe, with any kind of movement.  She was unable to get out of her bed because of pain today.  Therefore she is in the emergency room.  Patient denies any urinary incontinence, retention, numbness or tingling in her legs.   Prior to Admission medications  Medication Sig Start Date End Date Taking? Authorizing Provider  acetaminophen  (TYLENOL ) 500 MG tablet Take 2 tablets (1,000 mg total) by mouth in the morning, at noon, and at bedtime. 05/08/23   [name], NP  atorvastatin  (LIPITOR) 40 MG tablet Take 1 tablet (40 mg total) by mouth daily. 10/24/23 01/31/24  Walker, [name], NP  benzonatate  (TESSALON ) 200 MG capsule Take 1 capsule (200 mg total) by mouth 3 (three) times daily as needed for cough. 01/04/24   Early, [name], NP  Calcium -Magnesium -Vitamin D  (CALCIUM  1200+D3 PO) Take 1 tablet by mouth in the morning.    [provider]  cetirizine  (ZYRTEC ) 10 MG tablet TAKE 1 TABLET BY MOUTH ONCE DAILY 02/15/24   [name], MD  clotrimazole  (MYCELEX ) 10 MG troche Place one troche in the mouth 5 times a  day (every 3-4 hours while awake) for 14 days. Allow troche to fully dissolve. 02/07/24   Early, [name], NP  cyanocobalamin  (VITAMIN B12) 1000 MCG tablet 1,000 mcg every morning. 08/15/23   [provider]  Elastic Bandages & Supports (MEDICAL COMPRESSION STOCKINGS) MISC Two pair knee high 30-33mmHg compression stockings. Sized XL. As covered by insurance for ICD-10: I50.33, R60.0. 03/09/21   Early, [name], NP  fluticasone  (FLONASE ) 50 MCG/ACT nasal spray Place 1 spray into both nostrils 2 (two) times daily as needed for allergies or rhinitis. 10/23/23   [name], MD  Fluticasone -Umeclidin-Vilant (TRELEGY ELLIPTA ) 100-62.5-25 MCG/ACT AEPB Inhale 1 puff into the lungs daily. 10/01/23   [name], MD  ipratropium-albuterol  (DUONEB) 0.5-2.5 (3) MG/3ML SOLN Take 3 mLs by nebulization every 6 (six) hours as needed (wheezing). 10/23/23   [name], MD  LORazepam  (ATIVAN ) 1 MG tablet TAKE 1 TABLET BY MOUTH 2 TIMES DAILY AS NEEDED 01/11/24   Early, [name], NP  Menthol (COUGH DROPS) 5.8 MG LOZG Use as directed 1 lozenge (5.8 mg total) in the mouth or throat as needed (for dry, sore, or scratchy throat). 11/16/20   Early, [name], NP  metoprolol  tartrate (LOPRESSOR ) 25 MG tablet Take 1 tablet (25 mg total) by mouth 2 (two) times daily. 10/24/23   [name], NP  montelukast  (SINGULAIR ) 10 MG tablet Take 1 tablet (10 mg total) by mouth at bedtime.  10/23/23   [name], MD  OXYGEN  Inhale 2 L/min into the lungs continuous.    [provider]  pantoprazole  (PROTONIX ) 40 MG tablet Take 1 tablet (40 mg total) by mouth 2 (two) times daily before a meal. 09-19-23 Increase to pantoprazole  40mg  bid for 1 month, then reduce to pantoprazole  40mg  once daily 01/22/24   Craig Alan SAUNDERS, [name]  phenazopyridine  (PYRIDIUM ) 200 MG tablet Take 1 tablet (200 mg total) by mouth 3 (three) times daily as needed (urinary pain). 01/31/24   Early, [name], NP  predniSONE  (DELTASONE ) 10 MG  tablet Take 40mg  daily for 5 days, 30mg  daily for 3 days, 20mg  daily for 3 days, and 10mg  daily for 3 days. 01/31/24   Early, [name], NP  rivaroxaban  (XARELTO ) 20 MG TABS tablet Take 20 mg by mouth daily with supper.    [provider]  sertraline  (ZOLOFT ) 100 MG tablet TAKE ONE AND A HALF TABLETS BY MOUTH AT BEDTIME 02/15/24   Early, [name], NP  Spacer/Aero-Holding Chambers (AEROCHAMBER MV) inhaler Use as instructed 09/29/22   [name], MD  spironolactone  (ALDACTONE ) 25 MG tablet TAKE A HALF TABLET DAILY AS NEEDED FOR EDEMA OR FLUID RETENTION 10/24/23   Walker, [name], NP  sulfamethoxazole -trimethoprim  (BACTRIM  DS) 800-160 MG tablet Take 1 tablet by mouth 2 (two) times daily. 01/31/24   Early, [name], NP  Suvorexant  (BELSOMRA ) 5 MG TABS Take 1 tablet (5 mg total) by mouth at bedtime as needed (for sleep). 01/31/24   Early, [name], NP  torsemide  (DEMADEX ) 20 MG tablet TAKE 2 TABLETS DAILY 10/24/23   Walker, [name], NP  Vitamin D , Ergocalciferol , (DRISDOL ) 1.25 MG (50000 UNIT) CAPS capsule TAKE 1 CAPSULE BY MOUTH ONCE WEEKLY ON MONDAY 02/11/24   Early, [name], NP    Allergies: Acetaminophen -codeine, Naproxen-esomeprazole mg, and Codeine    Review of Systems  All other systems reviewed and are negative.   Updated Vital Signs BP 98/69   Pulse (!) 31   Temp 98.3 F (36.8 C) (Oral)   Resp 17   Ht 5' 7 (1.702 m)   Wt 100.7 kg   SpO2 100%   BMI 34.77 kg/m   Physical Exam Vitals and nursing note reviewed.  Constitutional:      Appearance: She is well-developed.  HENT:     Head: Atraumatic.  Neck:     Comments: No midline c-spine tenderness, pt able to turn head to 45 degrees bilaterally without any pain and able to flex neck to the chest and extend without any pain or neurologic symptoms.  Cardiovascular:     Rate and Rhythm: Normal rate.  Pulmonary:     Effort: Pulmonary effort is normal.  Musculoskeletal:     Cervical back: Normal range of motion and neck supple.      Comments: Pt has tenderness over the lumbar region-focal around L2-L4 No step offs, no erythema.  Able to discriminate between sharp and dull.    Skin:    General: Skin is warm and dry.  Neurological:     Mental Status: She is alert and oriented to person, place, and time.     (all labs ordered are listed, but only abnormal results are displayed) Labs Reviewed - No data to display  EKG: None  Radiology: No results found.  {Document cardiac monitor, telemetry assessment procedure when appropriate:32947} Procedures   Medications Ordered in the ED  lidocaine  (LIDODERM ) 5 % 1 patch (1 patch Transdermal Patch Applied  02/22/24 1637)  oxyCODONE -acetaminophen  (PERCOCET/ROXICET) 5-325 MG per tablet 1 tablet (1 tablet Oral Given 02/22/24 1636)      {Click here for ABCD2, HEART and other calculators REFRESH Note before signing:1}                              Medical Decision Making Amount and/or Complexity of Data Reviewed Radiology: ordered.  Risk Prescription drug management.   84 year old patient comes in after sustaining what appears to be a mechanical fall. Pertinent past medical includes anticoagulation because of A-fib.  Patient's fall actually was 2 days ago.  She has no direct head trauma, no signs of severe head trauma and has no red flags suggesting traumatic brain injury or elevated ICP. Collateral history provided by EMS.  Based on my history and exam, differential diagnosis includes: - Traumatic brain injury including intracranial hemorrhage - Long bone fractures - Contusions - Soft tissue injury - Concussion - Spine fracture - Hematoma  Based on the initial assessment, the following workup was initiated CT of the lumbar spine. I do not think CT scan of the head and C-spine would be helpful in this case. Given the severity of patient's pain, inability to stand up because of the pain, will get CT lumbar spine and set of x-ray lumbar spine.    Final  diagnoses:  None    ED Discharge Orders     None        "

## 2024-02-22 NOTE — ED Provider Notes (Signed)
 Patient was initially seen by Dr. Charlyn.  Please see his note.  Lindsay [name] was to follow-up on the CT scan of the lumbar spine.  CT scan showed old compression fractures and possible previous occult nondisplaced sacral fracture but no signs of acute fracture at this time.  Patient has been able to ambulate.  Stable for discharge back to the nursing facility   [name], MD 02/22/24 2324

## 2024-02-22 NOTE — Discharge Instructions (Addendum)
 The medications as needed for pain and discomfort.  You can take over-the-counter Tylenol  for mild pain and reserve the Percocet for more severe pain.  Do not exceed 4 g of acetaminophen  per 24-hour period.  Follow-up with your primary care doctor to be rechecked.

## 2024-02-22 NOTE — ED Notes (Signed)
 Pt ambulated with walker while on oxygen . Was stable

## 2024-02-25 ENCOUNTER — Encounter: Payer: Self-pay | Admitting: Nurse Practitioner

## 2024-02-25 ENCOUNTER — Telehealth: Payer: Self-pay

## 2024-02-25 DIAGNOSIS — S3210XA Unspecified fracture of sacrum, initial encounter for closed fracture: Secondary | ICD-10-CM

## 2024-02-25 MED ORDER — OXYCODONE-ACETAMINOPHEN 5-325 MG PO TABS: ORAL_TABLET | ORAL | 0 refills | Status: AC

## 2024-02-25 NOTE — Telephone Encounter (Signed)
 MyChart message has already been sent to provider.   Copied from CRM [id number]. Topic: Clinical - Medication Question >> Feb 25, 2024 10:43 AM Gattis SQUIBB wrote: Reason for CRM: Pt called asking for a refill of the pain medication she was given in the ER for her back.  Pt's number [phone]  She would like [facility] to deliver it for her if possible

## 2024-02-26 ENCOUNTER — Other Ambulatory Visit: Payer: Self-pay

## 2024-02-26 ENCOUNTER — Telehealth: Payer: Self-pay | Admitting: Nurse Practitioner

## 2024-02-26 ENCOUNTER — Encounter (HOSPITAL_BASED_OUTPATIENT_CLINIC_OR_DEPARTMENT_OTHER): Admitting: Pulmonary Disease

## 2024-02-26 ENCOUNTER — Encounter (HOSPITAL_BASED_OUTPATIENT_CLINIC_OR_DEPARTMENT_OTHER)

## 2024-02-26 ENCOUNTER — Ambulatory Visit: Payer: Self-pay

## 2024-02-26 DIAGNOSIS — S3210XA Unspecified fracture of sacrum, initial encounter for closed fracture: Secondary | ICD-10-CM

## 2024-02-26 NOTE — Telephone Encounter (Signed)
 Lindsay [name] called to get hospital follow up appt , states she is in pain from back Fx, I scheduled with you 2/4 in morning ( not officially scheduled because she has a phone appt at that time) but I just wasn't sure if you need to actually see her or does she need to be referred to Ortho, she states she has not been referred

## 2024-02-26 NOTE — Telephone Encounter (Signed)
 FYI Only or Action Required?: FYI only for provider: appointment scheduled on 2/4.  Patient was last seen in primary care on 01/31/2024 by Early, [name], NP.  Called Nurse Triage reporting Cough.  Symptoms began yesterday.  Interventions attempted: OTC medications: cough drops, benzonatate .  Symptoms are: gradually worsening.  Triage Disposition: See Physician Within 24 Hours  Patient/caregiver understands and will follow disposition?: Yes    Reason for Disposition  [1] Continuous (nonstop) coughing interferes with work or school AND [2] no improvement using cough treatment per Care Advice  Back pain is a chronic symptom (recurrent or ongoing AND present > 4 weeks)  Answer Assessment - Initial Assessment Questions 1. ONSET: When did the cough begin?      today 2. SEVERITY: How bad is the cough today?      nonstop 3. SPUTUM: Describe the color of your sputum (e.g., none, dry cough; clear, white, yellow, green)     whitish 4. HEMOPTYSIS: Are you coughing up any blood? If Yes, ask: How much? (e.g., flecks, streaks, tablespoons, etc.)     no 5. DIFFICULTY BREATHING: Are you having difficulty breathing? If Yes, ask: How bad is it? (e.g., mild, moderate, severe)      no 6. FEVER: Do you have a fever? If Yes, ask: What is your temperature, how was it measured, and when did it start?     no 7. CARDIAC HISTORY: Do you have any history of heart disease? (e.g., heart attack, congestive heart failure)      CHF 8. LUNG HISTORY: Do you have any history of lung disease?  (e.g., pulmonary embolus, asthma, emphysema)     COPD 9. PE RISK FACTORS: Do you have a history of blood clots? (or: recent major surgery, recent prolonged travel, bedridden)     Recent fall 10. OTHER SYMPTOMS: Do you have any other symptoms? (e.g., runny nose, wheezing, chest pain)       Back pain  Answer Assessment - Initial Assessment Questions 1. ONSET: When did the pain begin? (e.g.,  minutes, hours, days)     2 days ago- fell last week 2. LOCATION: Where does it hurt? (upper, mid or lower back)     Lower- across 3. SEVERITY: How bad is the pain?  (e.g., Scale 1-10; mild, moderate, or severe)     Sitting- 2/10, moving-10/10 4. PATTERN: Is the pain constant? (e.g., yes, no; constant, intermittent)      Comes and goes 5. RADIATION: Does the pain shoot into your legs or somewhere else?     no 6. CAUSE:  What do you think is causing the back pain?      Recent fall may have aggravated  7. BACK OVERUSE:  Any recent lifting of heavy objects, strenuous work or exercise?     na 8. MEDICINES: What have you taken so far for the pain? (e.g., nothing, acetaminophen , NSAIDS)     Oxycodone -helps some 9. NEUROLOGIC SYMPTOMS: Do you have any weakness, numbness, or problems with bowel/bladder control?     Constipation present 10. OTHER SYMPTOMS: Do you have any other symptoms? (e.g., fever, abdomen pain, burning with urination, blood in urine)       cough  Protocols used: Cough - Acute Productive-A-AH, Back Pain-A-AH

## 2024-02-26 NOTE — Telephone Encounter (Signed)
 First attempt to contact pt - attempted call x2, LVM x1 for call back to PCP or pulm office. Placed in call back.     Copied from CRM #[id number]. Topic: Clinical - Medical Advice >> Feb 26, 2024 12:45 PM Lindsay [name] wrote: Reason for CRM: Patient calling in because she began experiencing a really congested cough and is making her back hurt around 11am today. She is requesting cough medication. Please advise.  Call # [phone]

## 2024-02-27 ENCOUNTER — Ambulatory Visit: Admitting: Medical

## 2024-02-27 ENCOUNTER — Other Ambulatory Visit: Payer: Self-pay | Admitting: Licensed Clinical Social Worker

## 2024-02-27 ENCOUNTER — Ambulatory Visit: Admitting: Family Medicine

## 2024-02-27 NOTE — Patient Instructions (Signed)
 Visit Information  Thank you for taking time to visit with me today. Please don't hesitate to contact me if I can be of assistance to you before our next scheduled appointment.  Your next care management appointment is by telephone on 2/25 at 10 AM  Please call the care guide team at [phone] if you need to cancel, schedule, or reschedule an appointment.   Please call the Suicide and Crisis Lifeline: 988 go to [facility] [address] [place] [phone]) call 911 if you are experiencing a Mental Health or Behavioral Health Crisis or need someone to talk to.  Rolin Kerns, LCSW [place]  [facility], [facility] Clinical Social Worker Direct Dial: [phone]  Fax: [phone] Website: [email or web address] 5:41 PM

## 2024-02-27 NOTE — Patient Outreach (Signed)
 Complex Care Management   Visit Note  02/27/2024  Name:  Lindsay [name] MRN: [id number] DOB: Oct 14, 1940  Situation: Referral received for Complex Care Management related to Mental/Behavioral Health diagnosis Anxiety and Depression I obtained verbal consent from Patient.  Visit completed with Patient  on the phone  Background:   Past Medical History:  Diagnosis Date   Acute diastolic CHF (congestive heart failure) (HCC) 08/03/2020   Acute non-recurrent pansinusitis 10/21/2020   Acute on chronic diastolic CHF (congestive heart failure) (HCC) 08/03/2020   Acute on chronic respiratory failure with hypoxia (HCC) 08/02/2020   Acute on chronic respiratory failure with hypoxia and hypercapnia (HCC) 11/10/2022   Allergy    Anxiety    Asthma    Asthma exacerbation 08/02/2020   Atrial fibrillation (HCC)    Cataract    COPD (chronic obstructive pulmonary disease) (HCC)    Depression    Dysphagia 08/02/2020   Dyspnea 05/09/2021   Elevated troponin 09/14/2020   GERD (gastroesophageal reflux disease)    Globus sensation 04/03/2023   High cholesterol    History of compression fracture of spine 12/02/2019   Hospital discharge follow-up 11/28/2022   HTN (hypertension)    Hyperkalemia 09/14/2020   Hyponatremia 08/02/2020   Low grade squamous intraepithelial lesion on cytologic smear of cervix (LGSIL) 12/02/2019   Medication management 07/11/2023   Moderate persistent asthma without complication 12/02/2019   Non-healing wound of right lower extremity 08/01/2022   Osteoporosis    Oxygen  deficiency    Papule of skin 11/29/2022   Respiratory failure with hypoxia (HCC)    Sebaceous cyst 05/26/2022   Sinus congestion 11/29/2022   Sore throat 11/16/2020    Assessment: Patient Reported Symptoms:  Cognitive Cognitive Status: No symptoms reported, Alert and oriented to person, place, and time, Normal speech and language skills Cognitive/Intellectual Conditions Management [RPT]: None reported or  documented in medical history or problem list   Health Maintenance Behaviors: Annual physical exam  Neurological Neurological Review of Symptoms: Not assessed    HEENT HEENT Symptoms Reported: Not assessed      Cardiovascular Cardiovascular Symptoms Reported: Not assessed    Respiratory Respiratory Symptoms Reported: Not assesed    Endocrine Endocrine Symptoms Reported: Not assessed    Gastrointestinal Gastrointestinal Symptoms Reported: Nausea Gastrointestinal Management Strategies: Medication therapy Gastrointestinal Comment: Pt reports pain medication recently prescribed causes nausea. Will address with provider on 02/28/23    Genitourinary Genitourinary Symptoms Reported: Not assessed    Integumentary Integumentary Symptoms Reported: Not assessed    Musculoskeletal Musculoskelatal Symptoms Reviewed: Back pain Musculoskeletal Management Strategies: Coping strategies, Medication therapy, Medical device Musculoskeletal Comment: Patient endorsed three days of back pain after falling while transferring from chair one week ago. Has medications to assist with pain management and an appt with provider's office on 02/28/24 Falls in the past year?: Yes Number of falls in past year: 1 or less Was there an injury with Fall?: Yes (Back/shoulder pain) Fall Risk Category Calculator: 2 Patient Fall Risk Level: Moderate Fall Risk Fall risk Follow up: Falls prevention discussed  Psychosocial Other Psychosocial Conditions: Stress Behavioral Management Strategies: Coping strategies, Medication therapy, Adequate rest Behavioral Health Comment: Pt is concerned about health and ongoing pain after recent fall. Has support to assist with getting to upcoming medical appt. Validation and encouragement provided Major Change/Loss/Stressor/Fears (CP): Medical condition, self Techniques to Cope with Loss/Stress/Change: Diversional activities, Medication      02/27/2024    PHQ2-9 Depression Screening   Little  interest or pleasure in doing things  Feeling down, depressed, or hopeless    PHQ-2 - Total Score    Trouble falling or staying asleep, or sleeping too much    Feeling tired or having little energy    Poor appetite or overeating     Feeling bad about yourself - or that you are a failure or have let yourself or your family down    Trouble concentrating on things, such as reading the newspaper or watching television    Moving or speaking so slowly that other people could have noticed.  Or the opposite - being so fidgety or restless that you have been moving around a lot more than usual    Thoughts that you would be better off dead, or hurting yourself in some way    PHQ2-9 Total Score    If you checked off any problems, how difficult have these problems made it for you to do your work, take care of things at home, or get along with other people    Depression Interventions/Treatment      There were no vitals filed for this visit.    Medications Reviewed Today     Reviewed by Cavin Longman D, LCSW (Social Worker) on 02/27/24 at 1732  Med List Status: <None>   Medication Order Taking? Sig Documenting Provider Last Dose Status Informant  acetaminophen  (TYLENOL ) 500 MG tablet [id number]  Take 2 tablets (1,000 mg total) by mouth in the morning, at noon, and at bedtime. [name], NP  Active   atorvastatin  (LIPITOR) 40 MG tablet [id number]  Take 1 tablet (40 mg total) by mouth daily. [name], NP  Expired 01/31/24 2359   benzonatate  (TESSALON ) 200 MG capsule [id number]  Take 1 capsule (200 mg total) by mouth 3 (three) times daily as needed for cough. Early, [name], NP  Active   Calcium -Magnesium -Vitamin D  (CALCIUM  1200+D3 PO) [id number]  Take 1 tablet by mouth in the morning. [provider]  Active Self  cetirizine  (ZYRTEC ) 10 MG tablet [id number]  TAKE 1 TABLET BY MOUTH ONCE DAILY Dewald, Jonathan B, MD  Active   clotrimazole  (MYCELEX ) 10 MG troche [id number]  Place one  troche in the mouth 5 times a day (every 3-4 hours while awake) for 14 days. Allow troche to fully dissolve. Early, [name], NP  Active   cyanocobalamin  (VITAMIN B12) 1000 MCG tablet [id number]  1,000 mcg every morning. [provider]  Active   Elastic Bandages & Supports (MEDICAL COMPRESSION STOCKINGS) MISC [id number]  Two pair knee high 30-69mmHg compression stockings. Sized XL. As covered by insurance for ICD-10: I50.33, R60.0. Early, [name], NP  Active Self           Med Note (GANN, RILEY C   Mon Jul 02, 2023  1:52 PM) Wants to check on it.   fluticasone  (FLONASE ) 50 MCG/ACT nasal spray [id number]  Place 1 spray into both nostrils 2 (two) times daily as needed for allergies or rhinitis. [name], MD  Active   Fluticasone -Umeclidin-Vilant (TRELEGY ELLIPTA ) 100-62.5-25 MCG/ACT AEPB [id number]  Inhale 1 puff into the lungs daily. [name], MD  Active   ipratropium-albuterol  (DUONEB) 0.5-2.5 (3) MG/3ML SOLN [id number]  Take 3 mLs by nebulization every 6 (six) hours as needed (wheezing). [name], MD  Active   lidocaine  (LIDODERM ) 5 % [id number]  Place 1 patch onto the skin daily. Remove & Discard patch within 12 hours or as directed by MD [name], MD  Active   LORazepam  (ATIVAN )  1 MG tablet [id number]  TAKE 1 TABLET BY MOUTH 2 TIMES DAILY AS NEEDED Early, [name], NP  Active   Menthol (COUGH DROPS) 5.8 MG LOZG [id number]  Use as directed 1 lozenge (5.8 mg total) in the mouth or throat as needed (for dry, sore, or scratchy throat). Early, [name], NP  Active Self  metoprolol  tartrate (LOPRESSOR ) 25 MG tablet [id number]  Take 1 tablet (25 mg total) by mouth 2 (two) times daily. [name], NP  Active   montelukast  (SINGULAIR ) 10 MG tablet [id number]  Take 1 tablet (10 mg total) by mouth at bedtime. [name], MD  Active   oxyCODONE -acetaminophen  (PERCOCET/ROXICET) 5-325 MG tablet [id number]  Take 1 tablet for severe pain only as needed up to every 8 hours. Do  not take with lorazepam . Increases risk of slowed breathing. Use the lowest dose possible. Early, [name], NP  Active   OXYGEN  [id number]  Inhale 2 L/min into the lungs continuous. [provider]  Active Self  pantoprazole  (PROTONIX ) 40 MG tablet [id number]  Take 1 tablet (40 mg total) by mouth 2 (two) times daily before a meal. 09-19-23 Increase to pantoprazole  40mg  bid for 1 month, then reduce to pantoprazole  40mg  once daily Craig Alan SAUNDERS, [name]  Active   phenazopyridine  (PYRIDIUM ) 200 MG tablet [id number]  Take 1 tablet (200 mg total) by mouth 3 (three) times daily as needed (urinary pain). Early, [name], NP  Active   predniSONE  (DELTASONE ) 10 MG tablet [id number]  Take 40mg  daily for 5 days, 30mg  daily for 3 days, 20mg  daily for 3 days, and 10mg  daily for 3 days. Early, [name], NP  Active   rivaroxaban  (XARELTO ) 20 MG TABS tablet [id number]  Take 20 mg by mouth daily with supper. [provider]  Active   sertraline  (ZOLOFT ) 100 MG tablet [id number]  TAKE ONE AND A HALF TABLETS BY MOUTH AT BEDTIME Early, [name], NP  Active   Spacer/Aero-Holding Chambers (AEROCHAMBER MV) inhaler [id number]  Use as instructed [name], MD  Active Self  spironolactone  (ALDACTONE ) 25 MG tablet [id number]  TAKE A HALF TABLET DAILY AS NEEDED FOR EDEMA OR FLUID RETENTION Walker, [name], NP  Active   sulfamethoxazole -trimethoprim  (BACTRIM  DS) 800-160 MG tablet [id number]  Take 1 tablet by mouth 2 (two) times daily. Early, [name], NP  Active   Suvorexant  (BELSOMRA ) 5 MG TABS [id number]  Take 1 tablet (5 mg total) by mouth at bedtime as needed (for sleep). Oris [name], NP  Active   torsemide  (DEMADEX ) 20 MG tablet [id number]  TAKE 2 TABLETS DAILY Walker, [name], NP  Active   Vitamin D , Ergocalciferol , (DRISDOL ) 1.25 MG (50000 UNIT) CAPS capsule [id number]  TAKE 1 CAPSULE BY MOUTH ONCE WEEKLY ON MONDAY Early, [name], NP  Active             Recommendation:   Acute PCP follow-up 02/28/24 at 9  AM. Continue Current Plan of Care  Follow Up Plan:   Telephone follow-up in 1 month  Rolin Kerns, LCSW [facility]  [facility], [facility] Clinical Social Worker Direct Dial: [phone]  Fax: [phone] Website: [email or web address] 5:40 PM

## 2024-02-28 ENCOUNTER — Ambulatory Visit: Admitting: Family Medicine

## 2024-02-28 ENCOUNTER — Other Ambulatory Visit: Payer: Self-pay

## 2024-02-28 ENCOUNTER — Encounter (HOSPITAL_COMMUNITY): Payer: Self-pay | Admitting: Emergency Medicine

## 2024-02-28 ENCOUNTER — Ambulatory Visit: Payer: Self-pay

## 2024-02-28 ENCOUNTER — Other Ambulatory Visit

## 2024-02-28 ENCOUNTER — Inpatient Hospital Stay (HOSPITAL_COMMUNITY): Admission: EM | Admit: 2024-02-28 | Source: Home / Self Care | Attending: Family Medicine | Admitting: Family Medicine

## 2024-02-28 ENCOUNTER — Emergency Department (HOSPITAL_COMMUNITY)

## 2024-02-28 DIAGNOSIS — E1169 Type 2 diabetes mellitus with other specified complication: Secondary | ICD-10-CM

## 2024-02-28 DIAGNOSIS — E876 Hypokalemia: Secondary | ICD-10-CM

## 2024-02-28 DIAGNOSIS — R7989 Other specified abnormal findings of blood chemistry: Secondary | ICD-10-CM

## 2024-02-28 DIAGNOSIS — J9621 Acute and chronic respiratory failure with hypoxia: Secondary | ICD-10-CM

## 2024-02-28 DIAGNOSIS — K5903 Drug induced constipation: Secondary | ICD-10-CM

## 2024-02-28 DIAGNOSIS — R052 Subacute cough: Secondary | ICD-10-CM

## 2024-02-28 DIAGNOSIS — J4540 Moderate persistent asthma, uncomplicated: Secondary | ICD-10-CM

## 2024-02-28 DIAGNOSIS — J441 Chronic obstructive pulmonary disease with (acute) exacerbation: Secondary | ICD-10-CM

## 2024-02-28 DIAGNOSIS — I5033 Acute on chronic diastolic (congestive) heart failure: Secondary | ICD-10-CM

## 2024-02-28 DIAGNOSIS — F5104 Psychophysiologic insomnia: Secondary | ICD-10-CM

## 2024-02-28 DIAGNOSIS — I509 Heart failure, unspecified: Principal | ICD-10-CM

## 2024-02-28 DIAGNOSIS — F411 Generalized anxiety disorder: Secondary | ICD-10-CM

## 2024-02-28 DIAGNOSIS — F3341 Major depressive disorder, recurrent, in partial remission: Secondary | ICD-10-CM

## 2024-02-28 DIAGNOSIS — S3210XA Unspecified fracture of sacrum, initial encounter for closed fracture: Secondary | ICD-10-CM

## 2024-02-28 LAB — COMPREHENSIVE METABOLIC PANEL WITH GFR
ALT: 18 U/L (ref 0–44)
AST: 26 U/L (ref 15–41)
Albumin: 3.9 g/dL (ref 3.5–5.0)
Alkaline Phosphatase: 94 U/L (ref 38–126)
Anion gap: 12 (ref 5–15)
BUN: 24 mg/dL — ABNORMAL HIGH (ref 8–23)
CO2: 32 mmol/L (ref 22–32)
Calcium: 9 mg/dL (ref 8.9–10.3)
Chloride: 96 mmol/L — ABNORMAL LOW (ref 98–111)
Creatinine, Ser: 1.14 mg/dL — ABNORMAL HIGH (ref 0.44–1.00)
GFR, Estimated: 48 mL/min — ABNORMAL LOW
Glucose, Bld: 105 mg/dL — ABNORMAL HIGH (ref 70–99)
Potassium: 2.8 mmol/L — ABNORMAL LOW (ref 3.5–5.1)
Sodium: 140 mmol/L (ref 135–145)
Total Bilirubin: 0.5 mg/dL (ref 0.0–1.2)
Total Protein: 7.1 g/dL (ref 6.5–8.1)

## 2024-02-28 LAB — CBC WITH DIFFERENTIAL/PLATELET
Abs Immature Granulocytes: 0.03 10*3/uL (ref 0.00–0.07)
Basophils Absolute: 0 10*3/uL (ref 0.0–0.1)
Basophils Relative: 0 %
Eosinophils Absolute: 0.1 10*3/uL (ref 0.0–0.5)
Eosinophils Relative: 2 %
HCT: 34.3 % — ABNORMAL LOW (ref 36.0–46.0)
Hemoglobin: 11.3 g/dL — ABNORMAL LOW (ref 12.0–15.0)
Immature Granulocytes: 0 %
Lymphocytes Relative: 13 %
Lymphs Abs: 0.8 10*3/uL (ref 0.7–4.0)
MCH: 29.4 pg (ref 26.0–34.0)
MCHC: 32.9 g/dL (ref 30.0–36.0)
MCV: 89.1 fL (ref 80.0–100.0)
Monocytes Absolute: 0.5 10*3/uL (ref 0.1–1.0)
Monocytes Relative: 7 %
Neutro Abs: 5.2 10*3/uL (ref 1.7–7.7)
Neutrophils Relative %: 78 %
Platelets: 240 10*3/uL (ref 150–400)
RBC: 3.85 MIL/uL — ABNORMAL LOW (ref 3.87–5.11)
RDW: 15.4 % (ref 11.5–15.5)
WBC: 6.7 10*3/uL (ref 4.0–10.5)
nRBC: 0 % (ref 0.0–0.2)

## 2024-02-28 LAB — PRO BRAIN NATRIURETIC PEPTIDE: Pro Brain Natriuretic Peptide: 1579 pg/mL — ABNORMAL HIGH

## 2024-02-28 LAB — TROPONIN T, HIGH SENSITIVITY
Troponin T High Sensitivity: 21 ng/L — ABNORMAL HIGH (ref 0–19)
Troponin T High Sensitivity: 21 ng/L — ABNORMAL HIGH (ref 0–19)

## 2024-02-28 MED ORDER — FUROSEMIDE 10 MG/ML IJ SOLN
40.0000 mg | Freq: Two times a day (BID) | INTRAMUSCULAR | Status: AC
  Administered 2024-02-29 (×2): 40 mg via INTRAVENOUS
  Filled 2024-02-28 (×2): qty 4

## 2024-02-28 MED ORDER — ACETAMINOPHEN 500 MG PO TABS
1000.0000 mg | ORAL_TABLET | Freq: Once | ORAL | Status: AC
  Administered 2024-02-28: 1000 mg via ORAL
  Filled 2024-02-28: qty 2

## 2024-02-28 MED ORDER — BISACODYL 10 MG RE SUPP
10.0000 mg | Freq: Every day | RECTAL | Status: AC | PRN
  Filled 2024-02-28: qty 1

## 2024-02-28 MED ORDER — ATORVASTATIN CALCIUM 40 MG PO TABS
40.0000 mg | ORAL_TABLET | Freq: Every day | ORAL | Status: AC
  Administered 2024-02-28 – 2024-02-29 (×2): 40 mg via ORAL
  Filled 2024-02-28 (×2): qty 1

## 2024-02-28 MED ORDER — LIDOCAINE 5 % EX PTCH
2.0000 | MEDICATED_PATCH | CUTANEOUS | Status: AC
  Administered 2024-02-28 – 2024-02-29 (×2): 2 via TRANSDERMAL
  Filled 2024-02-28 (×2): qty 2

## 2024-02-28 MED ORDER — SPIRONOLACTONE 25 MG PO TABS
25.0000 mg | ORAL_TABLET | Freq: Every day | ORAL | Status: AC
  Administered 2024-02-28 – 2024-02-29 (×2): 25 mg via ORAL
  Filled 2024-02-28 (×2): qty 1

## 2024-02-28 MED ORDER — FUROSEMIDE 10 MG/ML IJ SOLN
40.0000 mg | Freq: Once | INTRAMUSCULAR | Status: AC
  Administered 2024-02-28: 40 mg via INTRAVENOUS
  Filled 2024-02-28: qty 4

## 2024-02-28 MED ORDER — ACETAMINOPHEN 325 MG PO TABS: 650.0000 mg | ORAL_TABLET | Freq: Four times a day (QID) | ORAL | Status: AC | PRN

## 2024-02-28 MED ORDER — SERTRALINE HCL 50 MG PO TABS
100.0000 mg | ORAL_TABLET | Freq: Every day | ORAL | Status: AC
  Administered 2024-02-28 – 2024-02-29 (×2): 100 mg via ORAL
  Filled 2024-02-28: qty 2
  Filled 2024-02-28: qty 1

## 2024-02-28 MED ORDER — ACETAMINOPHEN 650 MG RE SUPP: 650.0000 mg | Freq: Four times a day (QID) | RECTAL | Status: AC | PRN

## 2024-02-28 MED ORDER — IPRATROPIUM-ALBUTEROL 0.5-2.5 (3) MG/3ML IN SOLN: 3.0000 mL | Freq: Four times a day (QID) | RESPIRATORY_TRACT | Status: AC | PRN

## 2024-02-28 MED ORDER — MILK AND MOLASSES ENEMA
1.0000 | Freq: Once | RECTAL | Status: AC
  Filled 2024-02-28: qty 150

## 2024-02-28 MED ORDER — LORAZEPAM 0.5 MG PO TABS
0.5000 mg | ORAL_TABLET | Freq: Two times a day (BID) | ORAL | Status: AC | PRN
  Administered 2024-02-29: 0.5 mg via ORAL
  Filled 2024-02-28: qty 1

## 2024-02-28 MED ORDER — RIVAROXABAN 20 MG PO TABS
20.0000 mg | ORAL_TABLET | Freq: Every day | ORAL | Status: AC
  Administered 2024-02-28 – 2024-02-29 (×2): 20 mg via ORAL
  Filled 2024-02-28 (×2): qty 1

## 2024-02-28 MED ORDER — BUDESON-GLYCOPYRROL-FORMOTEROL 160-9-4.8 MCG/ACT IN AERO
2.0000 | INHALATION_SPRAY | Freq: Two times a day (BID) | RESPIRATORY_TRACT | Status: AC
  Administered 2024-02-29: 2 via RESPIRATORY_TRACT
  Filled 2024-02-28: qty 5.9

## 2024-02-28 MED ORDER — SUVOREXANT 5 MG PO TABS: 5.0000 mg | ORAL_TABLET | Freq: Every evening | ORAL | Status: AC | PRN

## 2024-02-28 MED ORDER — VITAMIN B-12 1000 MCG PO TABS
1000.0000 ug | ORAL_TABLET | Freq: Every morning | ORAL | Status: AC
  Administered 2024-02-29: 1000 ug via ORAL
  Filled 2024-02-28: qty 1

## 2024-02-28 MED ORDER — METOPROLOL TARTRATE 25 MG PO TABS
25.0000 mg | ORAL_TABLET | Freq: Two times a day (BID) | ORAL | Status: AC
  Administered 2024-02-28 – 2024-02-29 (×3): 25 mg via ORAL
  Filled 2024-02-28 (×3): qty 1

## 2024-02-28 MED ORDER — ONDANSETRON HCL 4 MG PO TABS: 4.0000 mg | ORAL_TABLET | Freq: Four times a day (QID) | ORAL | Status: AC | PRN

## 2024-02-28 MED ORDER — PANTOPRAZOLE SODIUM 40 MG PO TBEC
40.0000 mg | DELAYED_RELEASE_TABLET | Freq: Two times a day (BID) | ORAL | Status: AC
  Administered 2024-02-29 (×2): 40 mg via ORAL
  Filled 2024-02-28 (×2): qty 1

## 2024-02-28 MED ORDER — ONDANSETRON HCL 4 MG/2ML IJ SOLN
4.0000 mg | Freq: Four times a day (QID) | INTRAMUSCULAR | Status: AC | PRN
  Administered 2024-02-29: 4 mg via INTRAVENOUS
  Filled 2024-02-28: qty 2

## 2024-02-28 MED ORDER — OXYCODONE-ACETAMINOPHEN 5-325 MG PO TABS
1.0000 | ORAL_TABLET | Freq: Four times a day (QID) | ORAL | Status: AC | PRN
  Administered 2024-02-29 (×2): 1 via ORAL
  Filled 2024-02-28 (×2): qty 1

## 2024-02-28 MED ORDER — FLUTICASONE PROPIONATE 50 MCG/ACT NA SUSP: 1.0000 | Freq: Two times a day (BID) | NASAL | Status: AC | PRN

## 2024-02-28 MED ORDER — OXYCODONE HCL 5 MG PO TABS
5.0000 mg | ORAL_TABLET | Freq: Once | ORAL | Status: AC
  Administered 2024-02-28: 5 mg via ORAL
  Filled 2024-02-28: qty 1

## 2024-02-28 MED ORDER — BENZONATATE 100 MG PO CAPS: 200.0000 mg | ORAL_CAPSULE | Freq: Three times a day (TID) | ORAL | Status: AC | PRN

## 2024-02-28 MED ORDER — MONTELUKAST SODIUM 10 MG PO TABS
10.0000 mg | ORAL_TABLET | Freq: Every day | ORAL | Status: AC
  Administered 2024-02-28 – 2024-02-29 (×2): 10 mg via ORAL
  Filled 2024-02-28 (×2): qty 1

## 2024-02-28 MED ORDER — POTASSIUM CHLORIDE 10 MEQ/100ML IV SOLN: 10.0000 meq | INTRAVENOUS | Status: DC

## 2024-02-28 MED ORDER — POTASSIUM CHLORIDE CRYS ER 20 MEQ PO TBCR: 40.0000 meq | EXTENDED_RELEASE_TABLET | Freq: Once | ORAL | Status: DC

## 2024-02-28 MED ORDER — POTASSIUM CHLORIDE CRYS ER 20 MEQ PO TBCR
40.0000 meq | EXTENDED_RELEASE_TABLET | Freq: Once | ORAL | Status: AC
  Administered 2024-02-28: 40 meq via ORAL
  Filled 2024-02-28: qty 2

## 2024-02-28 NOTE — ED Provider Notes (Signed)
 " [place] EMERGENCY DEPARTMENT AT [facility] Provider Note   CSN: [id number] Arrival date & time: 02/28/24  8360     Patient presents with: Back Pain and Leg Swelling   Lindsay [name] is a 84 y.o. female.   The history is provided by the patient and medical records. No language interpreter was used.  Back Pain    84 year old female with history of CHF, chronic respiratory failure with hypoxia currently on Eliquis , physical deconditioning, recurrent falls, brought here by family member with complaint of back pain.  Patient states 5 days ago she fell and landed on her left shoulder.  She did not passed out and she did not have any significant pain but still days after that she started noticing pain along the spine.  She mention she was seen in the ED several days ago for back pain had a CT scan and was told that she has signs of injury to her spine. She has chronic compression fx of her spine, no new injury. She was prescribed opiate pain medication and while taking the medication she has had constipation and have not had a bowel movement for the past 3 days.  She is here with persistent back pain as well as not having a bowel movement.  She is able to pass flatus.  She also noticed that her legs are more swollen than usual.  States she is compliant with her Eliquis .  Patient lives at an independent living facility.  She use a walker to walk.  She is also on chronic O2 use at 2 L.   Prior to Admission medications  Medication Sig Start Date End Date Taking? Authorizing Provider  acetaminophen  (TYLENOL ) 500 MG tablet Take 2 tablets (1,000 mg total) by mouth in the morning, at noon, and at bedtime. 05/08/23   [name], NP  atorvastatin  (LIPITOR) 40 MG tablet Take 1 tablet (40 mg total) by mouth daily. 10/24/23 01/31/24  [name], NP  benzonatate  (TESSALON ) 200 MG capsule Take 1 capsule (200 mg total) by mouth 3 (three) times daily as needed for cough. 01/04/24   Early, Sara  E, NP  Calcium -Magnesium -Vitamin D  (CALCIUM  1200+D3 PO) Take 1 tablet by mouth in the morning.    [provider]  cetirizine  (ZYRTEC ) 10 MG tablet TAKE 1 TABLET BY MOUTH ONCE DAILY 02/15/24   [name], MD  clotrimazole  (MYCELEX ) 10 MG troche Place one troche in the mouth 5 times a day (every 3-4 hours while awake) for 14 days. Allow troche to fully dissolve. 02/07/24   Early, [name], NP  cyanocobalamin  (VITAMIN B12) 1000 MCG tablet 1,000 mcg every morning. 08/15/23   [provider]  Elastic Bandages & Supports (MEDICAL COMPRESSION STOCKINGS) MISC Two pair knee high 30-71mmHg compression stockings. Sized XL. As covered by insurance for ICD-10: I50.33, R60.0. 03/09/21   Early, [name], NP  fluticasone  (FLONASE ) 50 MCG/ACT nasal spray Place 1 spray into both nostrils 2 (two) times daily as needed for allergies or rhinitis. 10/23/23   [name], MD  Fluticasone -Umeclidin-Vilant (TRELEGY ELLIPTA ) 100-62.5-25 MCG/ACT AEPB Inhale 1 puff into the lungs daily. 10/01/23   [name], MD  ipratropium-albuterol  (DUONEB) 0.5-2.5 (3) MG/3ML SOLN Take 3 mLs by nebulization every 6 (six) hours as needed (wheezing). 10/23/23   [name], MD  lidocaine  (LIDODERM ) 5 % Place 1 patch onto the skin daily. Remove & Discard patch within 12 hours or as directed by MD 02/22/24   Randol,  Thom, MD  LORazepam  (ATIVAN ) 1 MG tablet TAKE 1 TABLET BY MOUTH 2 TIMES DAILY AS NEEDED 01/11/24   Early, [name], NP  Menthol (COUGH DROPS) 5.8 MG LOZG Use as directed 1 lozenge (5.8 mg total) in the mouth or throat as needed (for dry, sore, or scratchy throat). 11/16/20   Early, [name], NP  metoprolol  tartrate (LOPRESSOR ) 25 MG tablet Take 1 tablet (25 mg total) by mouth 2 (two) times daily. 10/24/23   [name], NP  montelukast  (SINGULAIR ) 10 MG tablet Take 1 tablet (10 mg total) by mouth at bedtime. 10/23/23   [name], MD  oxyCODONE -acetaminophen  (PERCOCET/ROXICET) 5-325 MG tablet  Take 1 tablet for severe pain only as needed up to every 8 hours. Do not take with lorazepam . Increases risk of slowed breathing. Use the lowest dose possible. 02/25/24   Early, [name], NP  OXYGEN  Inhale 2 L/min into the lungs continuous.    [provider]  pantoprazole  (PROTONIX ) 40 MG tablet Take 1 tablet (40 mg total) by mouth 2 (two) times daily before a meal. 09-19-23 Increase to pantoprazole  40mg  bid for 1 month, then reduce to pantoprazole  40mg  once daily 01/22/24   Craig Alan SAUNDERS, [name]  phenazopyridine  (PYRIDIUM ) 200 MG tablet Take 1 tablet (200 mg total) by mouth 3 (three) times daily as needed (urinary pain). 01/31/24   Early, [name], NP  predniSONE  (DELTASONE ) 10 MG tablet Take 40mg  daily for 5 days, 30mg  daily for 3 days, 20mg  daily for 3 days, and 10mg  daily for 3 days. 01/31/24   Early, [name], NP  rivaroxaban  (XARELTO ) 20 MG TABS tablet Take 20 mg by mouth daily with supper.    [provider]  sertraline  (ZOLOFT ) 100 MG tablet TAKE ONE AND A HALF TABLETS BY MOUTH AT BEDTIME 02/15/24   Early, [name], NP  Spacer/Aero-Holding Chambers (AEROCHAMBER MV) inhaler Use as instructed 09/29/22   [name], MD  spironolactone  (ALDACTONE ) 25 MG tablet TAKE A HALF TABLET DAILY AS NEEDED FOR EDEMA OR FLUID RETENTION 10/24/23   Walker, [name], NP  sulfamethoxazole -trimethoprim  (BACTRIM  DS) 800-160 MG tablet Take 1 tablet by mouth 2 (two) times daily. 01/31/24   Early, [name], NP  Suvorexant  (BELSOMRA ) 5 MG TABS Take 1 tablet (5 mg total) by mouth at bedtime as needed (for sleep). 01/31/24   Early, [name], NP  torsemide  (DEMADEX ) 20 MG tablet TAKE 2 TABLETS DAILY 10/24/23   Walker, [name], NP  Vitamin D , Ergocalciferol , (DRISDOL ) 1.25 MG (50000 UNIT) CAPS capsule TAKE 1 CAPSULE BY MOUTH ONCE WEEKLY ON MONDAY 02/11/24   Early, [name], NP    Allergies: Acetaminophen -codeine, Naproxen-esomeprazole mg, and Codeine    Review of Systems  Musculoskeletal:  Positive for back pain.  All  other systems reviewed and are negative.   Updated Vital Signs BP 111/81 (BP Location: Right Arm)   Pulse 72   Temp 97.9 F (36.6 C) (Oral)   Resp 16   SpO2 92%   Physical Exam Vitals and nursing note reviewed.  Constitutional:      General: She is not in acute distress.    Appearance: She is well-developed. She is obese.     Comments: Obese female resting comfortably in bed appears to be in no acute discomfort.  Patient is wearing supplemental oxygen .  HENT:     Head: Atraumatic.  Eyes:     Conjunctiva/sclera: Conjunctivae normal.  Cardiovascular:     Rate and Rhythm: Normal  rate and regular rhythm.     Pulses: Normal pulses.     Heart sounds: Normal heart sounds.  Pulmonary:     Effort: Pulmonary effort is normal.     Breath sounds: No wheezing, rhonchi or rales.  Abdominal:     Palpations: Abdomen is soft.     Tenderness: There is no abdominal tenderness.     Comments: Bowel sounds present.  Genitourinary:    Comments: Chaperone present during exam.  Normal rectal tone no obvious mass, stool noted in rectal vault but is not impacted.  Normal color stool on glove Musculoskeletal:        General: Tenderness (Tenderness along midline spine including thoracic and lumbar region without crepitus or step-off.) present.     Cervical back: Neck supple.     Right lower leg: Edema present.     Left lower leg: Edema present.  Skin:    Findings: No rash.  Neurological:     Mental Status: She is alert and oriented to person, place, and time.  Psychiatric:        Mood and Affect: Mood normal.     (all labs ordered are listed, but only abnormal results are displayed) Labs Reviewed  CBC WITH DIFFERENTIAL/PLATELET - Abnormal; Notable for the following components:      Result Value   RBC 3.85 (*)    Hemoglobin 11.3 (*)    HCT 34.3 (*)    All other components within normal limits  COMPREHENSIVE METABOLIC PANEL WITH GFR - Abnormal; Notable for the following components:    Potassium 2.8 (*)    Chloride 96 (*)    Glucose, Bld 105 (*)    BUN 24 (*)    Creatinine, Ser 1.14 (*)    GFR, Estimated 48 (*)    All other components within normal limits  PRO BRAIN NATRIURETIC PEPTIDE - Abnormal; Notable for the following components:   Pro Brain Natriuretic Peptide 1,579.0 (*)    All other components within normal limits  TROPONIN T, HIGH SENSITIVITY - Abnormal; Notable for the following components:   Troponin T High Sensitivity 21 (*)    All other components within normal limits  URINALYSIS, ROUTINE W REFLEX MICROSCOPIC  TSH  CBC  BASIC METABOLIC PANEL WITH GFR  TROPONIN T, HIGH SENSITIVITY    EKG: None ED ECG REPORT   Date: 02/28/2024  Rate: 73  Rhythm: atrial fibrillation  QRS Axis: normal  Intervals: normal  ST/T Wave abnormalities: nonspecific T wave changes  Conduction Disutrbances:none  Narrative Interpretation:   Old EKG Reviewed: unchanged  I have personally reviewed the EKG tracing and agree with the computerized printout as noted.   Radiology: DG Chest 2 View Result Date: 02/28/2024 EXAM: 2 VIEW(S) XRAY OF THE CHEST 02/28/2024 07:20:00 PM COMPARISON: 05/02/2023 CLINICAL HISTORY: Shortness of breath and leg swelling. FINDINGS: LUNGS AND PLEURA: Diffuse interstitial coarsening. Trace pleural effusions. No pneumothorax. HEART AND MEDIASTINUM: Cardiomegaly. BONES AND SOFT TISSUES: Multiple thoracic compression fractures. Exaggerated thoracic kyphosis and osteopenia. IMPRESSION: 1. Diffuse interstitial coarsening may be due to hypoinflation or interstitial edema. 2. Cardiomegaly. 3. Small bilateral pleural effusions. Electronically signed by: Norman Gatlin MD 02/28/2024 07:25 PM EST RP Workstation: [id number]     Procedures   Medications Ordered in the ED  oxyCODONE  (Oxy IR/ROXICODONE ) immediate release tablet 5 mg (has no administration in time range)  acetaminophen  (TYLENOL ) tablet 1,000 mg (has no administration in time range)  lidocaine   (LIDODERM ) 5 % 2 patch (has no administration in time range)  Medical Decision Making Risk Prescription drug management.   BP 111/81 (BP Location: Right Arm)   Pulse 72   Temp 97.9 F (36.6 C) (Oral)   Resp 16   SpO2 92%   79:26 PM  84 year old female with history of CHF, chronic respiratory failure with hypoxia currently on Eliquis , physical deconditioning, recurrent falls, brought here by family member with complaint of back pain.  Patient states 5 days ago she fell and landed on her left shoulder.  She did not passed out and she did not have any significant pain but still days after that she started noticing pain along the spine.  She mention she was seen in the ED several days ago for back pain had a CT scan and was told that she has signs of injury to her spine. She has chronic compression fx of her spine, no new injury. She was prescribed opiate pain medication and while taking the medication she has had constipation and have not had a bowel movement for the past 3 days.  She is here with persistent back pain as well as not having a bowel movement.  She is able to pass flatus.  She also noticed that her legs are more swollen than usual.  States she is compliant with her Eliquis .  Patient lives at an independent living facility.  She use a walker to walk.  She is also on chronic O2 use at 2 L.   Per daughter patient is mostly laying in her recliner chair and not moving around much.  This may contribute to her increased leg swelling.  On exam patient appears to be in no acute discomfort.  She is alert and oriented x 4 and answer questions appropriately.  She is wearing supplemental oxygen .  She has tenderness along her midline spine on palpation as well as having peripheral edema involving bilateral lower extremities.  She has a fairly benign abdominal exam and bowel sounds present.  -Labs ordered, independently viewed and interpreted by me.  Labs  remarkable for proBNP is elevated at 1500.  Troponin is mildly elevated 21 likely demand ischemia.  Potassium is low at 2.8, supplementation given. -The patient was maintained on a cardiac monitor.  I personally viewed and interpreted the cardiac monitored which showed an underlying rhythm of: Sinus rhythm -Imaging independently viewed and interpreted by me and I agree with radiologist's interpretation.  Result remarkable for chest x-ray shows diffuse interstitial coarsening along with small bilateral pleural effusions -This patient presents to the ED for concern of leg swelling, this involves an extensive number of treatment options, and is a complaint that carries with it a high risk of complications and morbidity.  The differential diagnosis includes CHF exacerbation, peripheral edema, cellulitis, DVT, -Co morbidities that complicate the patient evaluation includes CHF, chronic respiratory failure, recurrent falls -Treatment includes Lasix , potassium, milk & molasses enema. -Reevaluation of the patient after these medicines showed that the patient improved -PCP office notes or outside notes reviewed -Discussion with specialist [facility] Dr. Vernon who agrees to admit pt -Escalation to admission/observation considered: patient is agreeable with admission.       Final diagnoses:  Acute on chronic congestive heart failure, unspecified heart failure type (HCC)  Drug induced constipation  Hypokalemia  Elevated troponin I level    ED Discharge Orders     None          Lindsay Colon, [name] 02/28/24 2153    Lindsay Donnice PARAS, MD 02/28/24 [phone]  "

## 2024-02-28 NOTE — ED Triage Notes (Signed)
 Patient presents due to back pain. She reports fracturing her back. She also complains of finger swelling and constipation. Oxycodone  has not helped the pain.    Family noted states she fell 2 months ago. Last Tuesday she was seen in the ED where she had a CT done. It showed she re injured the area of sacral fracture. She also has 7 compression wedge fracture. Family has concerns if she will be able to manage on her own. She is unable to stand or bear weight on her own. She is currently on Eliquis  and can be forgetful. Family is also concerned she may have heart failure based on her complaint of leg swelling. Daughter can be call for additional information [phone]

## 2024-02-28 NOTE — ED Provider Triage Note (Signed)
 Emergency Medicine Provider Triage Evaluation Note  Lindsay [name] , a 84 y.o. female  was evaluated in triage.  Pt complains of increased leg swelling for the past 2-3 days. Denies any increased shortness of breath. No cough or fevers. History of CHF and compliant with torsemide . Reports poorly controlled back pain from recent compression fractures. Last dose of the oxycodone  at 6 am this morning. Reports constipation for the past 3 days, dysuria, and increased urinary frequency.   Review of Systems  Positive: As above Negative: As above  Physical Exam  BP 111/81 (BP Location: Right Arm)   Pulse 72   Temp 97.9 F (36.6 C) (Oral)   Resp 16   SpO2 92%  Gen:   Awake, no distress   Resp:  Normal effort. On baseline oxygen   MSK:   Moves extremities without difficulty  Other:  2 plus pitting edema from feet to knees bilaterally  Medical Decision Making  Medically screening exam initiated at 6:22 PM.  Appropriate orders placed.  Lindsay [name] was informed that the remainder of the evaluation will be completed by another provider, this initial triage assessment does not replace that evaluation, and the importance of remaining in the ED until their evaluation is complete.

## 2024-02-28 NOTE — Telephone Encounter (Signed)
 FYI Only or Action Required?: Action required by provider: update on patient condition.  Patient was last seen in primary care on 01/31/2024 by Early, [name], NP.  Called Nurse Triage reporting Back Pain.  Symptoms began 3 days ago .  Interventions attempted: Prescription medications: Percocet.  Symptoms are: unchanged.  Triage Disposition: See HCP Within 4 Hours (Or PCP Triage)  Patient/caregiver understands and will follow disposition?: yes Advised pt to go to [facility] in [place]. Address and phone number sent to pt via MyChart.         Message from Irena G sent at 02/28/2024 10:31 AM EST  Reason for Triage: Pt stated that the pain medicine (oxycodone )  for her lower back isnt helping. Pt wants to know what she should do or if another medication can be called in. Symptoms: constipation and swollen feet   Reason for Disposition  [1] SEVERE back pain (e.g., excruciating, unable to do any normal activities) AND [2] not improved 2 hours after pain medicine  Answer Assessment - Initial Assessment Questions 1. ONSET: When did the pain begin? (e.g., minutes, hours, days)     6 days ago fell now hurting for the 3rd day  2. LOCATION: Where does it hurt? (upper, mid or lower back)     Lower back  3. SEVERITY: How bad is the pain?  (e.g., Scale 1-10; mild, moderate, or severe)     9/10   varies from a 5-9 4. PATTERN: Is the pain constant? (e.g., yes, no; constant, intermittent)      Constant  5. RADIATION: Does the pain shoot into your legs or somewhere else?     Top part of buttock straight across  6. CAUSE:  What do you think is causing the back pain?      Falling down  7. BACK OVERUSE:  Any recent lifting of heavy objects, strenuous work or exercise?     Fell 6 days ago  8. MEDICINES: What have you taken so far for the pain? (e.g., nothing, acetaminophen , NSAIDS)     Oxycodone   9. NEUROLOGIC SYMPTOMS: Do you have any weakness, numbness, or problems  with bowel/bladder control?     H/o incontinence preexisting  10. OTHER SYMPTOMS: Do you have any other symptoms? (e.g., fever, abdomen pain, burning with urination, blood in urine)       Feet swelling, h/o no BM 3 days  11. PREGNANCY: Is there any chance you are pregnant? When was your last menstrual period?       N/a  Protocols used: Back Pain-A-AH

## 2024-02-28 NOTE — H&P (Signed)
 " History and Physical    Lindsay [name] FMW:[id number] DOB: 10/14/1940 DOA: 02/28/2024  PCP: Oris [name], NP  Patient coming from: Independent living facility  I have personally briefly reviewed patient's old medical records in [facility]  Chief Complaint: Worsening low back pain and bilateral lower extremity edema  HPI: Lindsay [name] is a 84 y.o. 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  According to the history, patient had a mechanical fall about 7 days ago where she landed on her left shoulder.  She came to the emergency department for assessment on 02/22/2024.  She was hemodynamically stable.  Since she did not lose consciousness or strike her head, no CT head or cervical spine was done.  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  Patient was hemodynamic stable and did well so she was discharged home.  She lives alone at independent living facility.  According to her, ever since, her back pain has been persistent and is getting worse to the point that it has affected her mobility.  She typically uses walker to walk.  She has been taking oxycodone  that she was prescribed for the pain and that has caused her constipation with last bowel movement 3 days ago.  Due to immobility, she has worsened lower extremity edema as well.  Her daughter went to see her today and due  to all of this, she was brought into the emergency department.  Patient denies any chest pain, shortness of breath, cough, fever, chills, sweating, any problem with urination or with bowel movement.  Patient's daughter who is an [employer] is at the bedside.  ED Course: Upon arrival to ED, hemodynamically stable.  Mild anemia on CBC.  Mild hypokalemia.  Creatinine is stable/has CKD.  Troponin slightly elevated at 21.  BNP elevated at 1579.  Chest x-ray consistent with interstitial edema.  Patient was given potassium in the ED as well as pain medication and enema and hospital service was consulted for admission.  Review of Systems: As per HPI otherwise negative.    Past Medical History:  Diagnosis Date   Acute diastolic CHF (congestive heart failure) (HCC) 08/03/2020   Acute non-recurrent pansinusitis 10/21/2020   Acute on chronic diastolic CHF (congestive heart failure) (HCC) 08/03/2020   Acute on chronic respiratory failure with hypoxia (HCC) 08/02/2020   Acute on chronic respiratory failure with hypoxia and hypercapnia (HCC) 11/10/2022   Allergy    Anxiety    Asthma    Asthma exacerbation 08/02/2020   Atrial fibrillation (HCC)    Cataract    COPD (chronic obstructive pulmonary disease) (HCC)    Depression    Dysphagia 08/02/2020   Dyspnea 05/09/2021   Elevated troponin 09/14/2020   GERD (gastroesophageal reflux disease)    Globus sensation 04/03/2023   High cholesterol    History of compression fracture of spine 12/02/2019   Hospital discharge follow-up 11/28/2022   HTN (hypertension)    Hyperkalemia 09/14/2020   Hyponatremia 08/02/2020  Low grade squamous intraepithelial lesion on cytologic smear of cervix (LGSIL) 12/02/2019   Medication management 07/11/2023   Moderate persistent asthma without complication 12/02/2019   Non-healing wound of right lower extremity 08/01/2022   Osteoporosis    Oxygen  deficiency    Papule of skin 11/29/2022   Respiratory failure with hypoxia (HCC)     Sebaceous cyst 05/26/2022   Sinus congestion 11/29/2022   Sore throat 11/16/2020    Past Surgical History:  Procedure Laterality Date   APPENDECTOMY     BREAST SURGERY     CARDIOVERSION N/A 09/13/2020   Procedure: CARDIOVERSION;  Surgeon: Loni Soyla LABOR, MD;  Location: [facility] ENDOSCOPY;  Service: Cardiovascular;  Laterality: N/A;   ESOPHAGOGASTRODUODENOSCOPY N/A 08/05/2020   Procedure: ESOPHAGOGASTRODUODENOSCOPY (EGD);  Surgeon: Elicia Claw, MD;  Location: [facility];  Service: Gastroenterology;  Laterality: N/A;   EYE SURGERY     TUBAL LIGATION       reports that she has quit smoking. Her smoking use included cigarettes. She has never used smokeless tobacco. She reports that she does not currently use alcohol. She reports that she does not use drugs.  Allergies[1]  Family History  Problem Relation Age of Onset   Breast cancer Sister    Hypertension Other     Prior to Admission medications  Medication Sig Start Date End Date Taking? Authorizing Provider  acetaminophen  (TYLENOL ) 500 MG tablet Take 2 tablets (1,000 mg total) by mouth in the morning, at noon, and at bedtime. 05/08/23   [name], NP  atorvastatin  (LIPITOR) 40 MG tablet Take 1 tablet (40 mg total) by mouth daily. 10/24/23 01/31/24  [name], NP  benzonatate  (TESSALON ) 200 MG capsule Take 1 capsule (200 mg total) by mouth 3 (three) times daily as needed for cough. 01/04/24   Early, [name], NP  Calcium -Magnesium -Vitamin D  (CALCIUM  1200+D3 PO) Take 1 tablet by mouth in the morning.    [provider]  cetirizine  (ZYRTEC ) 10 MG tablet TAKE 1 TABLET BY MOUTH ONCE DAILY 02/15/24   [name], MD  clotrimazole  (MYCELEX ) 10 MG troche Place one troche in the mouth 5 times a day (every 3-4 hours while awake) for 14 days. Allow troche to fully dissolve. 02/07/24   Early, [name], NP  cyanocobalamin  (VITAMIN B12) 1000 MCG tablet 1,000 mcg every morning. 08/15/23   [provider]   Elastic Bandages & Supports (MEDICAL COMPRESSION STOCKINGS) MISC Two pair knee high 30-43mmHg compression stockings. Sized XL. As covered by insurance for ICD-10: I50.33, R60.0. 03/09/21   Early, [name], NP  fluticasone  (FLONASE ) 50 MCG/ACT nasal spray Place 1 spray into both nostrils 2 (two) times daily as needed for allergies or rhinitis. 10/23/23   [name], MD  Fluticasone -Umeclidin-Vilant (TRELEGY ELLIPTA ) 100-62.5-25 MCG/ACT AEPB Inhale 1 puff into the lungs daily. 10/01/23   [name], MD  ipratropium-albuterol  (DUONEB) 0.5-2.5 (3) MG/3ML SOLN Take 3 mLs by nebulization every 6 (six) hours as needed (wheezing). 10/23/23   [name], MD  lidocaine  (LIDODERM ) 5 % Place 1 patch onto the skin daily. Remove & Discard patch within 12 hours or as directed by MD 02/22/24   [name], MD  LORazepam  (ATIVAN ) 1 MG tablet TAKE 1 TABLET BY MOUTH 2 TIMES DAILY AS NEEDED 01/11/24   Early, [name], NP  Menthol (COUGH DROPS) 5.8 MG LOZG Use as directed 1 lozenge (5.8 mg total) in the mouth or throat as needed (for dry, sore, or scratchy throat). 11/16/20  Early, [name], NP  metoprolol  tartrate (LOPRESSOR ) 25 MG tablet Take 1 tablet (25 mg total) by mouth 2 (two) times daily. 10/24/23   [name], NP  montelukast  (SINGULAIR ) 10 MG tablet Take 1 tablet (10 mg total) by mouth at bedtime. 10/23/23   [name], MD  oxyCODONE -acetaminophen  (PERCOCET/ROXICET) 5-325 MG tablet Take 1 tablet for severe pain only as needed up to every 8 hours. Do not take with lorazepam . Increases risk of slowed breathing. Use the lowest dose possible. 02/25/24   Early, [name], NP  OXYGEN  Inhale 2 L/min into the lungs continuous.    [provider]  pantoprazole  (PROTONIX ) 40 MG tablet Take 1 tablet (40 mg total) by mouth 2 (two) times daily before a meal. 09-19-23 Increase to pantoprazole  40mg  bid for 1 month, then reduce to pantoprazole  40mg  once daily 01/22/24   Craig Alan SAUNDERS, [name]   phenazopyridine  (PYRIDIUM ) 200 MG tablet Take 1 tablet (200 mg total) by mouth 3 (three) times daily as needed (urinary pain). 01/31/24   Early, [name], NP  predniSONE  (DELTASONE ) 10 MG tablet Take 40mg  daily for 5 days, 30mg  daily for 3 days, 20mg  daily for 3 days, and 10mg  daily for 3 days. 01/31/24   Early, [name], NP  rivaroxaban  (XARELTO ) 20 MG TABS tablet Take 20 mg by mouth daily with supper.    [provider]  sertraline  (ZOLOFT ) 100 MG tablet TAKE ONE AND A HALF TABLETS BY MOUTH AT BEDTIME 02/15/24   Early, [name], NP  Spacer/Aero-Holding Chambers (AEROCHAMBER MV) inhaler Use as instructed 09/29/22   [name], MD  spironolactone  (ALDACTONE ) 25 MG tablet TAKE A HALF TABLET DAILY AS NEEDED FOR EDEMA OR FLUID RETENTION 10/24/23   Walker, [name], NP  sulfamethoxazole -trimethoprim  (BACTRIM  DS) 800-160 MG tablet Take 1 tablet by mouth 2 (two) times daily. 01/31/24   Early, [name], NP  Suvorexant  (BELSOMRA ) 5 MG TABS Take 1 tablet (5 mg total) by mouth at bedtime as needed (for sleep). 01/31/24   Oris [name], NP  torsemide  (DEMADEX ) 20 MG tablet TAKE 2 TABLETS DAILY 10/24/23   Walker, [name], NP  Vitamin D , Ergocalciferol , (DRISDOL ) 1.25 MG (50000 UNIT) CAPS capsule TAKE 1 CAPSULE BY MOUTH ONCE WEEKLY ON MONDAY 02/11/24   Oris [name], NP    Physical Exam: Vitals:   02/28/24 1701 02/28/24 1722 02/28/24 2104  BP: 111/81  109/72  Pulse: 72  74  Resp: 16  18  Temp:  97.9 F (36.6 C) 98 F (36.7 C)  TempSrc:  Oral Oral  SpO2: (!) 87% 92% 97%    Constitutional: NAD, calm, comfortable Vitals:   02/28/24 1701 02/28/24 1722 02/28/24 2104  BP: 111/81  109/72  Pulse: 72  74  Resp: 16  18  Temp:  97.9 F (36.6 C) 98 F (36.7 C)  TempSrc:  Oral Oral  SpO2: (!) 87% 92% 97%   Eyes: PERRL, lids and conjunctivae normal ENMT: Mucous membranes are moist. Posterior pharynx clear of any exudate or lesions.Normal dentition.  Neck: normal, supple, no masses, no  thyromegaly Respiratory: clear to auscultation bilaterally, no wheezing, no crackles. Normal respiratory effort. No accessory muscle use.  Cardiovascular: Regular rate and rhythm, no murmurs / rubs / gallops. No extremity edema. 2+ pedal pulses. No carotid bruits.  Abdomen: no tenderness, no masses palpated. No hepatosplenomegaly. Bowel sounds positive.  Musculoskeletal: no clubbing / cyanosis. No joint deformity upper and lower extremities. Good ROM, no contractures. Normal  muscle tone.  Skin: no rashes, lesions, ulcers. No induration Neurologic: CN 2-12 grossly intact. Sensation intact, DTR normal. Strength 5/5 in all 4.  Psychiatric: Normal judgment and insight. Alert and oriented x 3. Normal mood.    Labs on Admission: I have personally reviewed following labs and imaging studies  CBC: Recent Labs  Lab 02/28/24 1938  WBC 6.7  NEUTROABS 5.2  HGB 11.3*  HCT 34.3*  MCV 89.1  PLT 240   Basic Metabolic Panel: Recent Labs  Lab 02/28/24 1938  NA 140  K 2.8*  CL 96*  CO2 32  GLUCOSE 105*  BUN 24*  CREATININE 1.14*  CALCIUM  9.0   GFR: Estimated Creatinine Clearance: 45.6 mL/min (A) (by C-G formula based on SCr of 1.14 mg/dL (H)). Liver Function Tests: Recent Labs  Lab 02/28/24 1938  AST 26  ALT 18  ALKPHOS 94  BILITOT 0.5  PROT 7.1  ALBUMIN 3.9   No results for input(s): LIPASE, AMYLASE in the last 168 hours. No results for input(s): AMMONIA in the last 168 hours. Coagulation Profile: No results for input(s): INR, PROTIME in the last 168 hours. Cardiac Enzymes: No results for input(s): CKTOTAL, CKMB, CKMBINDEX, TROPONINI in the last 168 hours. BNP (last 3 results) Recent Labs    03/01/23 1044 02/28/24 1938  PROBNP 195.0* 1,579.0*   HbA1C: No results for input(s): HGBA1C in the last 72 hours. CBG: No results for input(s): GLUCAP in the last 168 hours. Lipid Profile: No results for input(s): CHOL, HDL, LDLCALC, TRIG, CHOLHDL,  LDLDIRECT in the last 72 hours. Thyroid  Function Tests: No results for input(s): TSH, T4TOTAL, FREET4, T3FREE, THYROIDAB in the last 72 hours. Anemia Panel: No results for input(s): VITAMINB12, FOLATE, FERRITIN, TIBC, IRON, RETICCTPCT in the last 72 hours. Urine analysis:    Component Value Date/Time   COLORURINE YELLOW 06/14/2022 1859   APPEARANCEUR CLEAR 06/14/2022 1859   LABSPEC 1.018 06/14/2022 1859   PHURINE 5.5 06/14/2022 1859   GLUCOSEU NEGATIVE 06/14/2022 1859   HGBUR NEGATIVE 06/14/2022 1859   BILIRUBINUR large (A) 01/31/2024 1648   BILIRUBINUR Negative 10/21/2020 1122   KETONESUR moderate (40) (A) 01/31/2024 1648   KETONESUR NEGATIVE 06/14/2022 1859   PROTEINUR NEGATIVE 06/14/2022 1859   UROBILINOGEN 4.0 (A) 01/31/2024 1648   NITRITE Positive (A) 01/31/2024 1648   NITRITE NEGATIVE 06/14/2022 1859   LEUKOCYTESUR Large (3+) (A) 01/31/2024 1648   LEUKOCYTESUR SMALL (A) 06/14/2022 1859    Radiological Exams on Admission: DG Chest 2 View Result Date: 02/28/2024 EXAM: 2 VIEW(S) XRAY OF THE CHEST 02/28/2024 07:20:00 PM COMPARISON: 05/02/2023 CLINICAL HISTORY: Shortness of breath and leg swelling. FINDINGS: LUNGS AND PLEURA: Diffuse interstitial coarsening. Trace pleural effusions. No pneumothorax. HEART AND MEDIASTINUM: Cardiomegaly. BONES AND SOFT TISSUES: Multiple thoracic compression fractures. Exaggerated thoracic kyphosis and osteopenia. IMPRESSION: 1. Diffuse interstitial coarsening may be due to hypoinflation or interstitial edema. 2. Cardiomegaly. 3. Small bilateral pleural effusions. Electronically signed by: Norman Gatlin MD 02/28/2024 07:25 PM EST RP Workstation: [id number]    EKG: Independently reviewed.  Atrial fibrillation, rate controlled.  Assessment/Plan Principal Problem:   CHF (congestive heart failure) (HCC)   Acute on chronic congestive heart failure with preserved ejection fraction: Last echo on chart available from October 2024  which shows normal ejection fraction.  Patient takes Aldactone  and torsemide  at home and per her, she is compliant with her medications.  She denies any shortness of breath but has worsening lower extremity edema.  We were started on Lasix  40 mg IV twice daily,  strict I's and O's, low-sodium diet and daily weight.  Resume Aldactone .  Update echo.  Low back pain/healing sacral fracture/multiple thoracolumbar spine fractures which are old, POA: She does have point tenderness of the lower midline.  CT scan done about 5 days ago shows chronic findings, no surgical intervention, no indication for neurosurgical consultation.  Unfortunately, patient suffers from osteoporosis which is going to cause delay in the healing process.  I have consulted PT OT.  I suspect patient is going to need to discharge to SNF once medically stabilized.  Chronic hypoxic respiratory failure secondary to COPD: Requires 2 L of oxygen , currently at baseline.  Is on home medications.  Paroxysmal atrial fibrillation: Rate controlled, resume beta-blocker and Xarelto .  Hypokalemia: Will replenish.  Constipation secondary to opioid use: Has been given enema in the ED with some success.  I placed her on as needed Dulcolax suppository.  Prediabetes: Hemoglobin A1c under 6 in last 1 year.  She does not appear to be on any medications.  Blood sugar controlled.  Hyperlipidemia: Continue atorvastatin .  GERD: Continue PPI.  Anxiety and depression: Continue Zoloft .  CKD stage IIIa: At baseline.  DVT prophylaxis: Xarelto  Code Status: Full code Family Communication: Daughter present at bedside.  Plan of care discussed with patient in length and he verbalized understanding and agreed with it. Disposition Plan: Will likely require SNF discharge. Consults called: None  Fredia Skeeter MD [facility]  *Please note that this is a verbal dictation therefore any spelling or grammatical errors are due to the Dragon Medical One  system interpretation.  Please page via Amion and do not message via secure chat for urgent patient care matters. Secure chat can be used for non urgent patient care matters. 02/28/2024, 9:38 PM  To contact the attending provider between 7A-7P or the covering provider during after hours 7P-7A, please log into the web site [email or web address]     [1]  Allergies Allergen Reactions   Acetaminophen -Codeine Other (See Comments)    Agitation   Naproxen-Esomeprazole Mg Diarrhea   Codeine Other (See Comments)    Hallucinations    "

## 2024-02-29 ENCOUNTER — Telehealth

## 2024-02-29 ENCOUNTER — Inpatient Hospital Stay (HOSPITAL_COMMUNITY)

## 2024-02-29 LAB — CBC
HCT: 31.7 % — ABNORMAL LOW (ref 36.0–46.0)
Hemoglobin: 10.4 g/dL — ABNORMAL LOW (ref 12.0–15.0)
MCH: 29.5 pg (ref 26.0–34.0)
MCHC: 32.8 g/dL (ref 30.0–36.0)
MCV: 90.1 fL (ref 80.0–100.0)
Platelets: 206 10*3/uL (ref 150–400)
RBC: 3.52 MIL/uL — ABNORMAL LOW (ref 3.87–5.11)
RDW: 15.3 % (ref 11.5–15.5)
WBC: 5.1 10*3/uL (ref 4.0–10.5)
nRBC: 0 % (ref 0.0–0.2)

## 2024-02-29 LAB — URINALYSIS, ROUTINE W REFLEX MICROSCOPIC
Bacteria, UA: NONE SEEN
Bilirubin Urine: NEGATIVE
Glucose, UA: NEGATIVE mg/dL
Hgb urine dipstick: NEGATIVE
Ketones, ur: NEGATIVE mg/dL
Nitrite: NEGATIVE
Protein, ur: NEGATIVE mg/dL
Specific Gravity, Urine: 1.009 (ref 1.005–1.030)
pH: 7 (ref 5.0–8.0)

## 2024-02-29 LAB — BASIC METABOLIC PANEL WITH GFR
Anion gap: 10 (ref 5–15)
BUN: 18 mg/dL (ref 8–23)
CO2: 33 mmol/L — ABNORMAL HIGH (ref 22–32)
Calcium: 8.6 mg/dL — ABNORMAL LOW (ref 8.9–10.3)
Chloride: 98 mmol/L (ref 98–111)
Creatinine, Ser: 1 mg/dL (ref 0.44–1.00)
GFR, Estimated: 56 mL/min — ABNORMAL LOW
Glucose, Bld: 97 mg/dL (ref 70–99)
Potassium: 2.9 mmol/L — ABNORMAL LOW (ref 3.5–5.1)
Sodium: 140 mmol/L (ref 135–145)

## 2024-02-29 LAB — MAGNESIUM: Magnesium: 1.8 mg/dL (ref 1.7–2.4)

## 2024-02-29 LAB — TSH: TSH: 0.979 u[IU]/mL (ref 0.350–4.500)

## 2024-02-29 LAB — ECHOCARDIOGRAM COMPLETE
Area-P 1/2: 5.64 cm2
S' Lateral: 3.3 cm

## 2024-02-29 MED ORDER — POTASSIUM CHLORIDE 10 MEQ/100ML IV SOLN
10.0000 meq | INTRAVENOUS | Status: AC
  Administered 2024-02-29 (×3): 10 meq via INTRAVENOUS
  Filled 2024-02-29 (×3): qty 100

## 2024-02-29 MED ORDER — POTASSIUM CHLORIDE CRYS ER 20 MEQ PO TBCR
40.0000 meq | EXTENDED_RELEASE_TABLET | Freq: Once | ORAL | Status: AC
  Administered 2024-02-29: 40 meq via ORAL
  Filled 2024-02-29: qty 2

## 2024-02-29 MED ORDER — POLYETHYLENE GLYCOL 3350 17 G PO PACK: 17.0000 g | PACK | Freq: Every day | ORAL | Status: AC

## 2024-02-29 MED ORDER — SENNA 8.6 MG PO TABS
1.0000 | ORAL_TABLET | Freq: Every day | ORAL | Status: AC
  Administered 2024-02-29: 8.6 mg via ORAL
  Filled 2024-02-29: qty 1

## 2024-02-29 NOTE — Progress Notes (Signed)
 Patient with medium amount of brown stool after Dulcolax suppository.

## 2024-02-29 NOTE — ED Notes (Addendum)
 Pt states that she does not want her Miralax  and Senna at this time, states that she would rather take it tonight.  MD notified.

## 2024-02-29 NOTE — Progress Notes (Signed)
 " PROGRESS NOTE    Lindsay [name]  FMW:[id number] DOB: [birth year] DOA: 02/28/2024 PCP: Oris [name], NP  Subjective: No acute events overnight. Seen and examined at bedside. Reports ongoing back pain and unchanged bilateral LE edema. Denies shortness of breath, chest pain, palpitations, nausea, vomiting, constipation.   Hospital Course:  84 y.o. 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  According to the history, patient had a mechanical fall about 7 days ago where she landed on her left shoulder.  She came to the emergency department for assessment on 02/22/2024.  She was hemodynamically stable.  Since she did not lose consciousness or strike her head, no CT head or cervical spine was done.  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  Patient was hemodynamic stable and did well so she was discharged home.  She lives alone at independent living facility.  According to her, ever since, her back pain has been persistent and is getting worse to the point that it has affected her mobility.  She typically uses walker to walk.  She has been taking oxycodone  that she was prescribed for the pain and that has caused her constipation with last bowel movement 3 days ago.  Due to immobility, she has worsened lower extremity edema as well.  Her daughter went to see her today and due to all  of this, she was brought into the emergency department.    Admitted for acute CHF exacerbation and acute debilitation.   Assessment and Plan:  Acute CHFpEF exacerbation:  Last echo on chart available from October 2024 which shows normal ejection fraction.  Patient takes Aldactone  and torsemide  at home and per her, she is compliant with her medications.   She denies any shortness of breath but has worsening lower extremity edema.   - cont Lasix  40 mg IV twice daily - cont  Aldactone .   - strict I's and O's, low-sodium diet and daily weight.  - monitor on tele - TTE pending  Hypokalemia:  - Monitor and replete electrolytes as needed  - Monitor Mg   Acute debilitation Low back pain Healing sacral fracture Multiple chronic thoracolumbar spine fractures She does have point tenderness of the lower midline.  CT scan done about 5 days ago shows chronic findings, no surgical intervention, no indication for neurosurgical consultation.   Unfortunately, patient suffers from osteoporosis which is going to cause delay in the healing process.  - PT/OT eval pending.    Chronic hypoxic respiratory failure Secondary to COPD:  Currently on home 2 L of oxygen , currently at baseline.  Is on home medications. Monitor clinically   Paroxysmal atrial fibrillation:  Rate controlled - Cont home beta-blocker and Xarelto .   Constipation  Secondary to opioid use:  Has been given enema in the ED with some success.   - Cont as needed Dulcolax suppository. - start senna BID - start miralax  daily - monitor BMs   Prediabetes:  Hemoglobin A1c under 6 in  last 1 year.  She does not appear to be on any medications.  Blood sugar controlled. - monitor clinically   Hyperlipidemia: Continue atorvastatin .   GERD: Continue PPI.   Anxiety and depression: Continue Zoloft .   CKD stage IIIa: At baseline.  DVT prophylaxis:  rivaroxaban  (XARELTO ) tablet 20 mg  Xarelto    Code Status: Full  Code  Disposition Plan: TBD, PT/OT eval pending Reason for continuing need for hospitalization: IV diuresis, TTE pending, monitor for clinical improvement  Objective: Vitals:   02/29/24 0300 02/29/24 0430 02/29/24 0622 02/29/24 0803  BP: 111/71 104/60  112/73  Pulse: (!) 54 (!) 56  65  Resp: 17 19  (!) 21  Temp: 98 F (36.7 C)  98.3 F (36.8 C) 98 F (36.7 C)  TempSrc:    Oral  SpO2: 98% 99%  97%    Intake/Output Summary (Last 24 hours) at 02/29/2024 0959 Last data filed at 02/29/2024 0000 Gross per 24 hour  Intake --  Output 500 ml  Net -500 ml   There were no vitals filed for this visit.  Examination:  Physical Exam Vitals and nursing note reviewed.  Constitutional:      General: She is not in acute distress.    Appearance: She is obese. She is ill-appearing.     Comments: Weak, frail  HENT:     Head: Normocephalic and atraumatic.  Cardiovascular:     Rate and Rhythm: Normal rate and regular rhythm.     Pulses: Normal pulses.     Heart sounds: Normal heart sounds.  Pulmonary:     Effort: Pulmonary effort is normal. No respiratory distress.     Breath sounds: Normal breath sounds. No wheezing.  Abdominal:     General: Bowel sounds are normal.     Palpations: Abdomen is soft.  Musculoskeletal:     Right lower leg: Edema (2+ pitting) present.     Left lower leg: Edema (2+ pitting) present.  Neurological:     Mental Status: She is alert. Mental status is at baseline.     Data Reviewed: I have personally reviewed following labs and imaging studies  CBC: Recent Labs  Lab 02/28/24 1938 02/29/24 0507  WBC 6.7 5.1  NEUTROABS 5.2  --   HGB 11.3* 10.4*  HCT 34.3* 31.7*  MCV 89.1 90.1  PLT 240 206   Basic Metabolic Panel: Recent Labs  Lab 02/28/24 1938 02/29/24 0507  NA 140 140  K 2.8* 2.9*  CL 96* 98  CO2 32 33*  GLUCOSE 105* 97  BUN 24* 18  CREATININE 1.14* 1.00  CALCIUM  9.0 8.6*   GFR: Estimated Creatinine Clearance: 51.9 mL/min (by C-G  formula based on SCr of 1 mg/dL). Liver Function Tests: Recent Labs  Lab 02/28/24 1938  AST 26  ALT 18  ALKPHOS 94  BILITOT 0.5  PROT 7.1  ALBUMIN 3.9   No results for input(s): LIPASE, AMYLASE in the last 168 hours. No results for input(s): AMMONIA in the last 168 hours. Coagulation Profile: No results for input(s): INR, PROTIME in the last 168 hours. Cardiac Enzymes: No results for input(s): CKTOTAL, CKMB, CKMBINDEX, TROPONINI in the last 168 hours. ProBNP, BNP (last 5 results) Recent Labs    03/01/23 1044 03/22/23 1232 03/22/23 1725 04/17/23 0943 06/04/23 0929 01/01/24 1514 02/28/24 1938  PROBNP 195.0*  --   --   --   --   --  1,579.0*  BNP  --  141.7* 160.0* 122.5* 165.7* 187.3*  --  HbA1C: No results for input(s): HGBA1C in the last 72 hours. CBG: No results for input(s): GLUCAP in the last 168 hours. Lipid Profile: No results for input(s): CHOL, HDL, LDLCALC, TRIG, CHOLHDL, LDLDIRECT in the last 72 hours. Thyroid  Function Tests: Recent Labs    02/29/24 0507  TSH 0.979   Anemia Panel: No results for input(s): VITAMINB12, FOLATE, FERRITIN, TIBC, IRON, RETICCTPCT in the last 72 hours. Sepsis Labs: No results for input(s): PROCALCITON, LATICACIDVEN in the last 168 hours.  No results found for this or any previous visit (from the past 240 hours).   Radiology Studies: DG Chest 2 View Result Date: 02/28/2024 EXAM: 2 VIEW(S) XRAY OF THE CHEST 02/28/2024 07:20:00 PM COMPARISON: 05/02/2023 CLINICAL HISTORY: Shortness of breath and leg swelling. FINDINGS: LUNGS AND PLEURA: Diffuse interstitial coarsening. Trace pleural effusions. No pneumothorax. HEART AND MEDIASTINUM: Cardiomegaly. BONES AND SOFT TISSUES: Multiple thoracic compression fractures. Exaggerated thoracic kyphosis and osteopenia. IMPRESSION: 1. Diffuse interstitial coarsening may be due to hypoinflation or interstitial edema. 2. Cardiomegaly. 3. Small  bilateral pleural effusions. Electronically signed by: Norman Gatlin MD 02/28/2024 07:25 PM EST RP Workstation: [id number]    Scheduled Meds:  atorvastatin   40 mg Oral Daily   budesonide -glycopyrrolate -formoterol   2 puff Inhalation BID   cyanocobalamin   1,000 mcg Oral q morning   furosemide   40 mg Intravenous BID   lidocaine   2 patch Transdermal Q24H   metoprolol  tartrate  25 mg Oral BID   milk and molasses  1 enema Rectal Once   montelukast   10 mg Oral QHS   pantoprazole   40 mg Oral BID AC   rivaroxaban   20 mg Oral Q supper   sertraline   100 mg Oral QHS   spironolactone   25 mg Oral Daily   Continuous Infusions:  potassium chloride  10 mEq (02/29/24 0939)     LOS: 1 day   Norval Bar, MD  [facility]  02/29/2024, 9:59 AM   "

## 2024-02-29 NOTE — Evaluation (Signed)
 Physical Therapy Evaluation Patient Details Name: Lindsay [name] MRN: [id number] DOB: January 17, 1941 Today's Date: 02/29/2024  History of Present Illness  Lindsay [name] is a 84 y.o. female  ED due to continued and worsening low back pain and lower extremity edema after the fall last week.  According to the history, patient had a mechanical fall about 7 days ago where she landed on her left shoulder.  She came to the emergency department for assessment on 02/22/2024.  She was hemodynamically stable.  Since she did not lose consciousness or strike her head, no CT head or cervical spine was done.  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 Pt in bed, states that she has not attempted to get up yet. Pt requires mod A x2 for supine to sitting edge of bed, able to maintain sitting balance, Sit to Stand at min A x1 with RW. Progresses to side stepping with min A and RW as well. Able to return to sitting edge of bed, requires mod A x2 for sit to supine for trunk and LE assist due to pt apprehension for pain. Resting comfortably. O2 sats varied with questionable pleth, but onset of shortness of breath and inc HR limit mobility progression. Based on pt PLOF, level of support, and current functional status, pt will benefit from skilled inpatient physical therapy <3 hours per day at dc for safe progression of  functional mobility. Pt currently with functional limitations due to the deficits listed below (see PT Problem List). Pt will benefit from acute skilled PT to increase their independence and safety with mobility to allow discharge.      Vitals: pt on 2L O2 via [place] Supine: 84/71, 72 bpm Sitting: 98/66, 115 bpm Standing: 97/63, 74 bpm, 83% O2 Supine end of session: 95/75, 69bpm, 90% O2      If plan is discharge home, recommend the following: A little help with walking and/or transfers;Two people to help with walking and/or transfers;A little help with bathing/dressing/bathroom;Assistance with cooking/housework;Assist for transportation   Can travel by private vehicle   No    Equipment Recommendations None recommended by PT  Recommendations for Other Services       Functional Status Assessment Patient has had a recent decline in their functional status and demonstrates the ability to make significant improvements in function in a reasonable and predictable amount of time.     Precautions / Restrictions Precautions Precautions: Fall Recall of Precautions/Restrictions: Intact Precaution/Restrictions Comments: watch O2 and HR Restrictions Weight Bearing Restrictions Per Provider Order: No      Mobility  Bed Mobility Overal bed mobility: Needs Assistance Bed Mobility: Supine to Sit, Sit to Supine     Supine to sit: Mod assist, +2 for physical assistance Sit to supine: Mod assist, +2 for physical assistance   General bed mobility comments: LE assist and trunk assist, pt apprehensive about pain    Transfers Overall transfer level: Needs assistance Equipment used: Rolling walker (2 wheels) Transfers: Sit to/from Stand Sit  to Stand: Min assist           General transfer comment: HR variability, desats in standing, did not progress amb for pt safety at this time    Ambulation/Gait                  Stairs            Wheelchair Mobility     Tilt Bed     Modified Rankin (Stroke Patients Only)       Balance Overall balance assessment: Needs assistance Sitting-balance support: Feet supported, Bilateral upper extremity supported Sitting balance-Leahy Scale: Good     Standing balance support: Reliant on assistive device for balance, During functional activity Standing balance-Leahy Scale: Fair Standing balance comment: static standing tolerated fair, pt able to briefly stand without UE assist on AD, requires AD for dynamic situations (side stepping, weight shifting)                             Pertinent Vitals/Pain Pain Assessment Pain Assessment: 0-10 Pain Score: 2  Pain Location: left lumbar spine Pain Descriptors / Indicators: Aching Pain Intervention(s): Limited activity within patient's tolerance, Monitored during session, Repositioned    Home Living Family/patient expects to be discharged to:: Other (Comment) (ILF)                   Additional Comments: lives in ILF, walks to dining room, has WC for community participation    Prior Function Prior Level of Function : Independent/Modified Independent             Mobility Comments: uses walker to move about facility, has WC for community ADLs Comments: pt reports ind/mod I with ADLs     Extremity/Trunk Assessment   Upper Extremity Assessment Upper Extremity Assessment: Defer to OT evaluation    Lower Extremity Assessment Lower Extremity Assessment: Generalized weakness    Cervical / Trunk Assessment Cervical / Trunk Assessment: Normal  Communication   Communication Communication: Impaired Factors Affecting Communication: Hearing impaired    Cognition Arousal: Alert Behavior During Therapy: WFL for tasks assessed/performed   PT - Cognitive impairments: No apparent impairments                         Following commands: Impaired Following commands impaired: Follows one step commands with increased time, Follows multi-step  commands with increased time     Cueing Cueing Techniques: Verbal cues, Gestural cues, Tactile cues     General Comments General comments (skin integrity, edema, etc.): BLE edema, LLE>RLE    Exercises     Assessment/Plan    PT Assessment Patient needs continued PT services  PT Problem List Decreased strength;Decreased balance;Decreased range of motion;Decreased activity tolerance;Decreased mobility;Pain       PT Treatment Interventions DME instruction;Therapeutic activities;Gait training;Therapeutic exercise;Balance training;Functional mobility training;Patient/family education    PT Goals (Current goals can be found in the Care Plan section)  Acute Rehab PT Goals Patient Stated Goal: return home, get back to being able to walk PT Goal Formulation: With patient Time For Goal Achievement: 03/14/24 Potential to Achieve Goals: Good    Frequency Min 3X/week     Co-evaluation PT/OT/SLP Co-Evaluation/Treatment: Yes Reason for Co-Treatment: Complexity of the patient's impairments (multi-system involvement);Necessary to address cognition/behavior during functional activity;For patient/therapist safety;To address functional/ADL transfers PT goals addressed during session: Mobility/safety with mobility;Balance;Proper use of DME OT goals addressed during session: ADL's and self-care;Proper  use of Adaptive equipment and DME       AM-PAC PT 6 Clicks Mobility  Outcome Measure Help needed turning from your back to your side while in a flat bed without using bedrails?: A Lot Help needed moving from lying on your back to sitting on the side of a flat bed without using bedrails?: A Lot Help needed moving to and from a bed to a chair (including a wheelchair)?: A Little Help needed standing up from a chair using your arms (e.g., wheelchair or bedside chair)?: A Little Help needed to walk in hospital room?: Total Help needed climbing 3-5 steps with a railing? : Total 6 Click Score: 12     End of Session Equipment Utilized During Treatment: Gait belt Activity Tolerance: Patient tolerated treatment well;Other (comment) (desats in standing) Patient left: in bed;with call bell/phone within reach Nurse Communication: Mobility status PT Visit Diagnosis: Unsteadiness on feet (R26.81);Other abnormalities of gait and mobility (R26.89);Muscle weakness (generalized) (M62.81);Pain Pain - Right/Left: Left Pain - part of body:  (lumbar)    Time: 8491-8457 PT Time Calculation (min) (ACUTE ONLY): 34 min   Charges:   PT Evaluation $PT Eval Low Complexity: 1 Low PT Treatments $Therapeutic Activity: 8-22 mins PT General Charges $$ ACUTE PT VISIT: 1 Visit         Stann, PT Acute Rehabilitation Services Office: [phone] 02/29/2024   Stann DELENA Ohara 02/29/2024, 4:07 PM

## 2024-02-29 NOTE — Evaluation (Signed)
 Occupational Therapy Evaluation Patient Details Name: Lindsay [name] MRN: [id number] DOB: [birth year] Today's Date: 02/29/2024   History of Present Illness   Lindsay [name] is a 84 y.o. female  ED due to continued and worsening low back pain and lower extremity edema after the fall last week.  According to the history, patient had a mechanical fall about 7 days ago where she landed on her left shoulder.  She came to the emergency department for assessment on 02/22/2024.  She was hemodynamically stable.  Since she did not lose consciousness or strike her head, no CT head or cervical spine was done.  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 Meals provided in dining room and has assist for transportation. S/p fall last week with residual pain and weakness but no therapy restarted at ILF (had in recent past). Currently, patient presents with deficits outlined below (see OT Problem List for details) most significantly pain, generalized muscle strength, B LE edema and skin integrity issues, decreased activity tolerance, balance and mild higher level cognitive deficits limiting BADL's and functional mobility performance. Patient will benefit from continued inpatient follow up therapy, <3 hours/day.  Patient requires continued Acute care hospital level OT services to progress safety and functional performance and allow for discharge.   Vitals: pt on 2L O2 via  Supine: 84/71, 72 bpm Sitting: 98/66, 115 bpm Standing: 97/63, 74 bpm, 83% O2 Supine end of session: 95/75, 69bpm, 90% O2      If plan is discharge home, recommend the following:   A lot of help with walking and/or transfers;A lot of help with bathing/dressing/bathroom;Assistance with cooking/housework;Assist for transportation;Help with stairs or ramp for entrance     Functional Status Assessment   Patient has had a recent decline in their functional status and demonstrates the ability to make significant improvements in function in a reasonable and predictable amount of time.     Equipment Recommendations   None recommended by OT      Precautions/Restrictions   Precautions Precautions: Fall Recall of Precautions/Restrictions: Intact Precaution/Restrictions Comments: watch O2 and HR Restrictions Weight Bearing Restrictions Per Provider Order: No     Mobility Bed Mobility Overal bed mobility: Needs Assistance Bed Mobility: Supine to Sit, Sit to Supine     Supine to sit: Mod assist, +2 for physical assistance Sit to supine: Mod assist, +2 for physical assistance   General bed mobility comments: LE assist and trunk assist, pt apprehensive about pain    Transfers Overall transfer level: Needs assistance Equipment used: Rolling walker (2 wheels) Transfers: Sit to/from Stand Sit to Stand: Min assist           General transfer comment: HR variability, desats in standing, did not progress amb for pt safety at this time      Balance Overall balance assessment: Needs assistance Sitting-balance support: Feet supported, Bilateral upper  extremity supported Sitting balance-Leahy Scale: Good     Standing balance support: Reliant on assistive device for balance, During functional activity Standing  balance-Leahy Scale: Fair Standing balance comment: static standing tolerated fair, pt able to briefly stand without UE assist on AD, requires AD for dynamic situations (side stepping, weight shifting)                           ADL either performed or assessed with clinical judgement   ADL Overall ADL's : Needs assistance/impaired Eating/Feeding: Set up;Sitting   Grooming: Wash/dry hands;Wash/dry face;Set up;Sitting   Upper Body Bathing: Minimal assistance;Sitting   Lower Body Bathing: Maximal assistance;Sit to/from stand;Cueing for sequencing   Upper Body Dressing : Minimal assistance;Sitting   Lower Body Dressing: Maximal assistance;Sit to/from stand;Cueing for sequencing;Cueing for safety   Toilet Transfer: Moderate assistance;BSC/3in1;Rolling walker (2 wheels)   Toileting- Clothing Manipulation and Hygiene: Minimal assistance;Moderate assistance;Sitting/lateral lean       Functional mobility during ADLs: Minimal assistance;Rolling walker (2 wheels);Moderate assistance General ADL Comments: decreased tolerance and functional reach     Vision Baseline Vision/History: 0 No visual deficits;1 Wears glasses Ability to See in Adequate Light: 0 Adequate Patient Visual Report: No change from baseline Vision Assessment?: No apparent visual deficits;Wears glasses for reading            Pertinent Vitals/Pain Pain Assessment Pain Assessment: 0-10 Pain Score: 2  Pain Location: left lumbar spine Pain Descriptors / Indicators: Aching Pain Intervention(s): Limited activity within patient's tolerance, Monitored during session, Repositioned     Extremity/Trunk Assessment Upper Extremity Assessment Upper Extremity Assessment: Right hand dominant;Generalized weakness   Lower Extremity Assessment Lower Extremity Assessment: Defer to PT evaluation   Cervical / Trunk Assessment Cervical / Trunk Assessment: Normal   Communication Communication Communication:  Impaired Factors Affecting Communication: Hearing impaired   Cognition Arousal: Alert Behavior During Therapy: WFL for tasks assessed/performed Cognition: History of cognitive impairments             OT - Cognition Comments: higher level sequencing and organization deficits                 Following commands: Impaired Following commands impaired: Follows one step commands with increased time, Follows multi-step commands with increased time     Cueing  General Comments   Cueing Techniques: Verbal cues;Gestural cues;Tactile cues  B LE edema with discoloration distally, see narrative for VSR           Home Living Family/patient expects to be discharged to:: Other (Comment) (ILF)                                 Additional Comments: lives in ILF, walks to dining room, has WC for community participation      Prior Functioning/Environment Prior Level of Function : Independent/Modified Independent             Mobility Comments: uses walker to move about facility, has WC for community ADLs Comments: pt reports ind/mod I with ADLs    OT Problem List: Decreased strength;Decreased activity tolerance;Impaired balance (sitting and/or standing);Decreased knowledge of use of DME or AE;Decreased knowledge of precautions;Cardiopulmonary status limiting activity;Obesity;Pain;Increased edema   OT Treatment/Interventions: Self-care/ADL training;Therapeutic exercise;Neuromuscular education;Energy conservation;DME and/or AE instruction;Therapeutic activities;Cognitive remediation/compensation;Patient/family education;Balance training      OT Goals(Current goals can be found in the care plan section)   Acute Rehab OT Goals Patient  Stated Goal: to get stronger OT Goal Formulation: With patient Time For Goal Achievement: 03/14/24 Potential to Achieve Goals: Fair ADL Goals Pt Will Perform Lower Body Bathing: with min assist;sit to/from stand;with adaptive  equipment Pt Will Perform Lower Body Dressing: with min assist;with adaptive equipment;sit to/from stand Pt Will Transfer to Toilet: with contact guard assist;bedside commode;ambulating Pt Will Perform Toileting - Clothing Manipulation and hygiene: with min assist;sitting/lateral leans Pt Will Perform Tub/Shower Transfer: with min assist;Shower transfer;shower seat;rolling walker;grab bars Pt/caregiver will Perform Home Exercise Program: Increased strength;Both right and left upper extremity;With Supervision;With written HEP provided   OT Frequency:  Min 2X/week    Co-evaluation   Reason for Co-Treatment: Complexity of the patient's impairments (multi-system involvement);Necessary to address cognition/behavior during functional activity;For patient/therapist safety;To address functional/ADL transfers PT goals addressed during session: Mobility/safety with mobility;Balance;Proper use of DME OT goals addressed during session: ADL's and self-care;Proper use of Adaptive equipment and DME      AM-PAC OT 6 Clicks Daily Activity     Outcome Measure Help from another person eating meals?: A Little Help from another person taking care of personal grooming?: A Little Help from another person toileting, which includes using toliet, bedpan, or urinal?: A Lot Help from another person bathing (including washing, rinsing, drying)?: A Lot Help from another person to put on and taking off regular upper body clothing?: A Little Help from another person to put on and taking off regular lower body clothing?: A Lot 6 Click Score: 15   End of Session Equipment Utilized During Treatment: Gait belt;Rolling walker (2 wheels);Oxygen  Nurse Communication: Mobility status  Activity Tolerance: Other (comment) (lower BB and SPO@) Patient left: in bed;with call bell/phone within reach;with bed alarm set;with nursing/sitter in room  OT Visit Diagnosis: Unsteadiness on feet (R26.81);Muscle weakness (generalized)  (M62.81);Cognitive communication deficit (R41.841);Pain Pain - part of body:  (Back)                Time: 8491-8457 OT Time Calculation (min): 34 min Charges:  OT General Charges $OT Visit: 1 Visit OT Evaluation $OT Eval Low Complexity: 1 Low OT Treatments $Therapeutic Activity: 8-22 mins  Deivi Huckins OT/L Acute Rehabilitation Department  [phone]  02/29/2024, 4:50 PM

## 2024-02-29 NOTE — Progress Notes (Signed)
" °  Echocardiogram 2D Echocardiogram has been performed.  Tinnie FORBES Gosling RDCS 02/29/2024, 11:44 AM "

## 2024-03-04 ENCOUNTER — Telehealth

## 2024-03-05 ENCOUNTER — Inpatient Hospital Stay (HOSPITAL_COMMUNITY): Admission: RE | Admit: 2024-03-05

## 2024-03-06 ENCOUNTER — Other Ambulatory Visit

## 2024-03-19 ENCOUNTER — Telehealth: Admitting: Licensed Clinical Social Worker

## 2024-03-24 ENCOUNTER — Ambulatory Visit: Admitting: Nurse Practitioner

## 2024-03-25 ENCOUNTER — Ambulatory Visit: Admitting: Nurse Practitioner

## 2024-03-27 ENCOUNTER — Encounter (HOSPITAL_BASED_OUTPATIENT_CLINIC_OR_DEPARTMENT_OTHER): Admitting: Pulmonary Disease

## 2024-03-27 ENCOUNTER — Encounter (HOSPITAL_BASED_OUTPATIENT_CLINIC_OR_DEPARTMENT_OTHER)

## 2024-04-15 ENCOUNTER — Ambulatory Visit (HOSPITAL_BASED_OUTPATIENT_CLINIC_OR_DEPARTMENT_OTHER): Admitting: Family

## 2024-08-05 ENCOUNTER — Ambulatory Visit: Payer: Self-pay

## 2024-09-03 ENCOUNTER — Encounter (HOSPITAL_COMMUNITY)
